# Patient Record
Sex: Female | Born: 1935
Health system: Southern US, Community
[De-identification: ages and names within clinical notes are randomized; demographics above are authoritative.]

## PROBLEM LIST (undated history)

## (undated) ENCOUNTER — Ambulatory Visit: Payer: Medicare Other

## (undated) DIAGNOSIS — I779 Disorder of arteries and arterioles, unspecified: Secondary | ICD-10-CM

## (undated) DIAGNOSIS — E785 Hyperlipidemia, unspecified: Secondary | ICD-10-CM

## (undated) DIAGNOSIS — K8050 Calculus of bile duct without cholangitis or cholecystitis without obstruction: Secondary | ICD-10-CM

## (undated) DIAGNOSIS — R112 Nausea with vomiting, unspecified: Secondary | ICD-10-CM

## (undated) DIAGNOSIS — I739 Peripheral vascular disease, unspecified: Secondary | ICD-10-CM

## (undated) DIAGNOSIS — L910 Hypertrophic scar: Secondary | ICD-10-CM

## (undated) DIAGNOSIS — F419 Anxiety disorder, unspecified: Secondary | ICD-10-CM

## (undated) DIAGNOSIS — R7301 Impaired fasting glucose: Secondary | ICD-10-CM

## (undated) DIAGNOSIS — K8309 Other cholangitis: Secondary | ICD-10-CM

## (undated) DIAGNOSIS — I9789 Other postprocedural complications and disorders of the circulatory system, not elsewhere classified: Secondary | ICD-10-CM

## (undated) DIAGNOSIS — Z973 Presence of spectacles and contact lenses: Secondary | ICD-10-CM

## (undated) DIAGNOSIS — K8020 Calculus of gallbladder without cholecystitis without obstruction: Secondary | ICD-10-CM

## (undated) DIAGNOSIS — M1990 Unspecified osteoarthritis, unspecified site: Secondary | ICD-10-CM

## (undated) DIAGNOSIS — R519 Headache, unspecified: Secondary | ICD-10-CM

## (undated) DIAGNOSIS — F329 Major depressive disorder, single episode, unspecified: Secondary | ICD-10-CM

## (undated) DIAGNOSIS — F32A Depression, unspecified: Secondary | ICD-10-CM

## (undated) DIAGNOSIS — Z9289 Personal history of other medical treatment: Secondary | ICD-10-CM

## (undated) DIAGNOSIS — Z951 Presence of aortocoronary bypass graft: Secondary | ICD-10-CM

## (undated) DIAGNOSIS — G459 Transient cerebral ischemic attack, unspecified: Secondary | ICD-10-CM

## (undated) DIAGNOSIS — C7A090 Malignant carcinoid tumor of the bronchus and lung: Secondary | ICD-10-CM

## (undated) DIAGNOSIS — H269 Unspecified cataract: Secondary | ICD-10-CM

## (undated) DIAGNOSIS — J189 Pneumonia, unspecified organism: Secondary | ICD-10-CM

## (undated) DIAGNOSIS — I2510 Atherosclerotic heart disease of native coronary artery without angina pectoris: Secondary | ICD-10-CM

## (undated) DIAGNOSIS — I493 Ventricular premature depolarization: Secondary | ICD-10-CM

## (undated) DIAGNOSIS — K529 Noninfective gastroenteritis and colitis, unspecified: Secondary | ICD-10-CM

## (undated) DIAGNOSIS — IMO0002 Reserved for concepts with insufficient information to code with codable children: Secondary | ICD-10-CM

## (undated) DIAGNOSIS — I4891 Unspecified atrial fibrillation: Secondary | ICD-10-CM

## (undated) DIAGNOSIS — C801 Malignant (primary) neoplasm, unspecified: Secondary | ICD-10-CM

## (undated) DIAGNOSIS — Z9889 Other specified postprocedural states: Secondary | ICD-10-CM

## (undated) DIAGNOSIS — R51 Headache: Secondary | ICD-10-CM

## (undated) DIAGNOSIS — I10 Essential (primary) hypertension: Secondary | ICD-10-CM

## (undated) DIAGNOSIS — Z974 Presence of external hearing-aid: Secondary | ICD-10-CM

## (undated) DIAGNOSIS — H3530 Unspecified macular degeneration: Secondary | ICD-10-CM

## (undated) HISTORY — DX: Unspecified macular degeneration: H35.30

## (undated) HISTORY — PX: LOBECTOMY: SHX5089

## (undated) HISTORY — DX: Atherosclerotic heart disease of native coronary artery without angina pectoris: I25.10

## (undated) HISTORY — DX: Essential (primary) hypertension: I10

## (undated) HISTORY — DX: Anxiety disorder, unspecified: F41.9

## (undated) HISTORY — DX: Calculus of gallbladder without cholecystitis without obstruction: K80.20

## (undated) HISTORY — DX: Malignant carcinoid tumor of the bronchus and lung: C7A.090

## (undated) HISTORY — DX: Major depressive disorder, single episode, unspecified: F32.9

## (undated) HISTORY — PX: CHOLECYSTECTOMY: SHX55

## (undated) HISTORY — DX: Unspecified osteoarthritis, unspecified site: M19.90

## (undated) HISTORY — PX: OTHER SURGICAL HISTORY: SHX169

## (undated) HISTORY — DX: Transient cerebral ischemic attack, unspecified: G45.9

## (undated) HISTORY — DX: Unspecified cataract: H26.9

## (undated) HISTORY — PX: ABDOMINAL HYSTERECTOMY: SHX81

## (undated) HISTORY — DX: Impaired fasting glucose: R73.01

## (undated) HISTORY — DX: Depression, unspecified: F32.A

## (undated) HISTORY — DX: Reserved for concepts with insufficient information to code with codable children: IMO0002

## (undated) HISTORY — DX: Gilbert syndrome: E80.4

## (undated) HISTORY — DX: Hypertrophic scar: L91.0

---

## 1984-01-13 HISTORY — PX: OTHER SURGICAL HISTORY: SHX169

## 1998-02-04 ENCOUNTER — Inpatient Hospital Stay (HOSPITAL_COMMUNITY): Admission: AD | Admit: 1998-02-04 | Discharge: 1998-02-08 | Payer: Self-pay | Admitting: Rheumatology

## 1998-02-06 ENCOUNTER — Encounter: Payer: Self-pay | Admitting: Rheumatology

## 1998-12-27 ENCOUNTER — Encounter: Admission: RE | Admit: 1998-12-27 | Discharge: 1998-12-27 | Payer: Self-pay | Admitting: Rheumatology

## 1998-12-27 ENCOUNTER — Encounter: Payer: Self-pay | Admitting: Rheumatology

## 1999-01-13 HISTORY — PX: OTHER SURGICAL HISTORY: SHX169

## 1999-03-05 ENCOUNTER — Encounter: Payer: Self-pay | Admitting: Rheumatology

## 1999-03-05 ENCOUNTER — Encounter: Admission: RE | Admit: 1999-03-05 | Discharge: 1999-03-05 | Payer: Self-pay | Admitting: Rheumatology

## 1999-05-12 ENCOUNTER — Encounter: Payer: Self-pay | Admitting: Rheumatology

## 1999-05-12 ENCOUNTER — Encounter: Admission: RE | Admit: 1999-05-12 | Discharge: 1999-05-12 | Payer: Self-pay | Admitting: Rheumatology

## 1999-09-01 ENCOUNTER — Encounter: Admission: RE | Admit: 1999-09-01 | Discharge: 1999-09-01 | Payer: Self-pay | Admitting: Rheumatology

## 1999-09-01 ENCOUNTER — Encounter: Payer: Self-pay | Admitting: Rheumatology

## 1999-12-05 ENCOUNTER — Inpatient Hospital Stay (HOSPITAL_COMMUNITY): Admission: AD | Admit: 1999-12-05 | Discharge: 1999-12-09 | Payer: Self-pay | Admitting: Cardiology

## 2000-03-04 ENCOUNTER — Encounter: Payer: Self-pay | Admitting: Rheumatology

## 2000-03-04 ENCOUNTER — Encounter: Admission: RE | Admit: 2000-03-04 | Discharge: 2000-03-04 | Payer: Self-pay | Admitting: Rheumatology

## 2000-06-29 ENCOUNTER — Encounter: Payer: Self-pay | Admitting: Rheumatology

## 2000-06-29 ENCOUNTER — Encounter: Admission: RE | Admit: 2000-06-29 | Discharge: 2000-06-29 | Payer: Self-pay | Admitting: Rheumatology

## 2000-07-06 ENCOUNTER — Encounter: Admission: RE | Admit: 2000-07-06 | Discharge: 2000-07-06 | Payer: Self-pay | Admitting: Rheumatology

## 2000-07-06 ENCOUNTER — Encounter: Payer: Self-pay | Admitting: Rheumatology

## 2000-10-29 ENCOUNTER — Ambulatory Visit (HOSPITAL_COMMUNITY): Admission: RE | Admit: 2000-10-29 | Discharge: 2000-10-29 | Payer: Self-pay | Admitting: Gastroenterology

## 2001-02-22 ENCOUNTER — Encounter: Payer: Self-pay | Admitting: Rheumatology

## 2001-02-22 ENCOUNTER — Encounter: Admission: RE | Admit: 2001-02-22 | Discharge: 2001-02-22 | Payer: Self-pay | Admitting: Rheumatology

## 2001-06-20 ENCOUNTER — Encounter: Admission: RE | Admit: 2001-06-20 | Discharge: 2001-06-20 | Payer: Self-pay | Admitting: Rheumatology

## 2001-06-20 ENCOUNTER — Encounter: Payer: Self-pay | Admitting: Rheumatology

## 2001-07-11 ENCOUNTER — Encounter: Payer: Self-pay | Admitting: Rheumatology

## 2001-07-11 ENCOUNTER — Encounter: Admission: RE | Admit: 2001-07-11 | Discharge: 2001-07-11 | Payer: Self-pay | Admitting: Rheumatology

## 2001-11-11 ENCOUNTER — Emergency Department (HOSPITAL_COMMUNITY): Admission: EM | Admit: 2001-11-11 | Discharge: 2001-11-11 | Payer: Self-pay | Admitting: Emergency Medicine

## 2001-11-15 ENCOUNTER — Ambulatory Visit (HOSPITAL_COMMUNITY): Admission: RE | Admit: 2001-11-15 | Discharge: 2001-11-15 | Payer: Self-pay | Admitting: Rheumatology

## 2001-11-15 ENCOUNTER — Encounter: Payer: Self-pay | Admitting: Rheumatology

## 2001-11-16 ENCOUNTER — Ambulatory Visit (HOSPITAL_COMMUNITY): Admission: RE | Admit: 2001-11-16 | Discharge: 2001-11-16 | Payer: Self-pay | Admitting: Rheumatology

## 2001-12-16 ENCOUNTER — Encounter: Admission: RE | Admit: 2001-12-16 | Discharge: 2001-12-16 | Payer: Self-pay | Admitting: Rheumatology

## 2001-12-16 ENCOUNTER — Encounter: Payer: Self-pay | Admitting: Rheumatology

## 2002-09-19 ENCOUNTER — Encounter: Payer: Self-pay | Admitting: Rheumatology

## 2002-09-19 ENCOUNTER — Encounter: Admission: RE | Admit: 2002-09-19 | Discharge: 2002-09-19 | Payer: Self-pay | Admitting: Rheumatology

## 2002-09-22 ENCOUNTER — Encounter: Admission: RE | Admit: 2002-09-22 | Discharge: 2002-09-22 | Payer: Self-pay | Admitting: Rheumatology

## 2002-09-22 ENCOUNTER — Encounter: Payer: Self-pay | Admitting: Rheumatology

## 2002-11-08 ENCOUNTER — Encounter: Admission: RE | Admit: 2002-11-08 | Discharge: 2002-11-08 | Payer: Self-pay | Admitting: Thoracic Surgery

## 2002-11-15 ENCOUNTER — Inpatient Hospital Stay (HOSPITAL_COMMUNITY): Admission: RE | Admit: 2002-11-15 | Discharge: 2002-11-22 | Payer: Self-pay | Admitting: Thoracic Surgery

## 2002-11-15 ENCOUNTER — Encounter (INDEPENDENT_AMBULATORY_CARE_PROVIDER_SITE_OTHER): Payer: Self-pay | Admitting: *Deleted

## 2002-12-05 ENCOUNTER — Encounter: Admission: RE | Admit: 2002-12-05 | Discharge: 2002-12-05 | Payer: Self-pay | Admitting: Thoracic Surgery

## 2002-12-08 ENCOUNTER — Emergency Department (HOSPITAL_COMMUNITY): Admission: EM | Admit: 2002-12-08 | Discharge: 2002-12-08 | Payer: Self-pay | Admitting: Emergency Medicine

## 2002-12-27 ENCOUNTER — Encounter: Admission: RE | Admit: 2002-12-27 | Discharge: 2002-12-27 | Payer: Self-pay | Admitting: Thoracic Surgery

## 2003-02-14 ENCOUNTER — Ambulatory Visit (HOSPITAL_COMMUNITY): Admission: RE | Admit: 2003-02-14 | Discharge: 2003-02-14 | Payer: Self-pay | Admitting: Neurology

## 2003-03-28 ENCOUNTER — Encounter: Admission: RE | Admit: 2003-03-28 | Discharge: 2003-03-28 | Payer: Self-pay | Admitting: Thoracic Surgery

## 2003-07-03 ENCOUNTER — Encounter: Admission: RE | Admit: 2003-07-03 | Discharge: 2003-07-03 | Payer: Self-pay | Admitting: Thoracic Surgery

## 2003-10-10 ENCOUNTER — Encounter: Admission: RE | Admit: 2003-10-10 | Discharge: 2003-10-10 | Payer: Self-pay | Admitting: Rheumatology

## 2003-11-22 ENCOUNTER — Encounter: Admission: RE | Admit: 2003-11-22 | Discharge: 2003-11-22 | Payer: Self-pay | Admitting: Thoracic Surgery

## 2003-12-14 ENCOUNTER — Ambulatory Visit: Payer: Self-pay | Admitting: Cardiology

## 2004-03-24 ENCOUNTER — Ambulatory Visit: Payer: Self-pay | Admitting: Thoracic Surgery

## 2004-03-26 ENCOUNTER — Encounter: Admission: RE | Admit: 2004-03-26 | Discharge: 2004-03-26 | Payer: Self-pay | Admitting: Thoracic Surgery

## 2004-04-16 ENCOUNTER — Ambulatory Visit: Payer: Self-pay | Admitting: Cardiology

## 2004-05-02 ENCOUNTER — Ambulatory Visit: Payer: Self-pay

## 2004-09-24 ENCOUNTER — Encounter: Admission: RE | Admit: 2004-09-24 | Discharge: 2004-09-24 | Payer: Self-pay | Admitting: Thoracic Surgery

## 2004-11-24 ENCOUNTER — Encounter: Admission: RE | Admit: 2004-11-24 | Discharge: 2004-11-24 | Payer: Self-pay | Admitting: Rheumatology

## 2005-03-24 ENCOUNTER — Encounter: Admission: RE | Admit: 2005-03-24 | Discharge: 2005-03-24 | Payer: Self-pay | Admitting: Thoracic Surgery

## 2005-04-20 ENCOUNTER — Ambulatory Visit: Payer: Self-pay | Admitting: Cardiology

## 2005-05-19 ENCOUNTER — Ambulatory Visit: Payer: Self-pay

## 2005-07-04 ENCOUNTER — Emergency Department (HOSPITAL_COMMUNITY): Admission: EM | Admit: 2005-07-04 | Discharge: 2005-07-04 | Payer: Self-pay | Admitting: Emergency Medicine

## 2005-08-27 ENCOUNTER — Encounter: Admission: RE | Admit: 2005-08-27 | Discharge: 2005-08-27 | Payer: Self-pay | Admitting: Rheumatology

## 2005-09-01 ENCOUNTER — Ambulatory Visit: Payer: Self-pay | Admitting: Cardiology

## 2005-11-25 ENCOUNTER — Encounter: Admission: RE | Admit: 2005-11-25 | Discharge: 2005-11-25 | Payer: Self-pay | Admitting: Rheumatology

## 2006-03-16 ENCOUNTER — Ambulatory Visit: Payer: Self-pay | Admitting: Thoracic Surgery

## 2006-03-16 ENCOUNTER — Encounter: Admission: RE | Admit: 2006-03-16 | Discharge: 2006-03-16 | Payer: Self-pay | Admitting: Thoracic Surgery

## 2006-04-28 ENCOUNTER — Encounter: Admission: RE | Admit: 2006-04-28 | Discharge: 2006-04-28 | Payer: Self-pay | Admitting: Rheumatology

## 2006-05-03 ENCOUNTER — Ambulatory Visit: Payer: Self-pay

## 2006-05-05 ENCOUNTER — Ambulatory Visit: Payer: Self-pay | Admitting: Cardiology

## 2006-05-06 ENCOUNTER — Ambulatory Visit: Payer: Self-pay

## 2006-05-11 ENCOUNTER — Ambulatory Visit: Payer: Self-pay | Admitting: Cardiology

## 2006-05-11 LAB — CONVERTED CEMR LAB
BUN: 14 mg/dL (ref 6–23)
Basophils Relative: 0.3 % (ref 0.0–1.0)
CO2: 31 meq/L (ref 19–32)
Creatinine, Ser: 0.9 mg/dL (ref 0.4–1.2)
Eosinophils Relative: 1.8 % (ref 0.0–5.0)
Glucose, Bld: 110 mg/dL — ABNORMAL HIGH (ref 70–99)
HCT: 42.4 % (ref 36.0–46.0)
Hemoglobin: 14.6 g/dL (ref 12.0–15.0)
INR: 0.9 (ref 0.9–2.0)
Lymphocytes Relative: 27.1 % (ref 12.0–46.0)
Monocytes Absolute: 0.6 10*3/uL (ref 0.2–0.7)
Monocytes Relative: 8.6 % (ref 3.0–11.0)
Neutrophils Relative %: 62.2 % (ref 43.0–77.0)
Potassium: 3.1 meq/L — ABNORMAL LOW (ref 3.5–5.1)
Prothrombin Time: 11.4 s (ref 10.0–14.0)
RDW: 12.6 % (ref 11.5–14.6)
WBC: 6.8 10*3/uL (ref 4.5–10.5)

## 2006-05-12 ENCOUNTER — Ambulatory Visit: Payer: Self-pay | Admitting: Cardiology

## 2006-05-12 ENCOUNTER — Ambulatory Visit (HOSPITAL_COMMUNITY): Admission: RE | Admit: 2006-05-12 | Discharge: 2006-05-12 | Payer: Self-pay | Admitting: Cardiology

## 2006-05-18 ENCOUNTER — Ambulatory Visit: Payer: Self-pay | Admitting: Cardiology

## 2006-05-18 LAB — CONVERTED CEMR LAB
BUN: 14 mg/dL (ref 6–23)
CO2: 30 meq/L (ref 19–32)
Creatinine, Ser: 0.8 mg/dL (ref 0.4–1.2)
Glucose, Bld: 108 mg/dL — ABNORMAL HIGH (ref 70–99)
Potassium: 3.6 meq/L (ref 3.5–5.1)

## 2006-06-03 ENCOUNTER — Ambulatory Visit: Payer: Self-pay | Admitting: Cardiology

## 2006-06-03 LAB — CONVERTED CEMR LAB
BUN: 10 mg/dL (ref 6–23)
CO2: 33 meq/L — ABNORMAL HIGH (ref 19–32)
Calcium: 9.6 mg/dL (ref 8.4–10.5)
Creatinine, Ser: 0.8 mg/dL (ref 0.4–1.2)

## 2006-06-09 ENCOUNTER — Ambulatory Visit: Payer: Self-pay | Admitting: Cardiology

## 2006-06-09 LAB — CONVERTED CEMR LAB
CO2: 31 meq/L (ref 19–32)
Calcium: 9.6 mg/dL (ref 8.4–10.5)
Chloride: 102 meq/L (ref 96–112)
Glucose, Bld: 128 mg/dL — ABNORMAL HIGH (ref 70–99)

## 2006-06-17 ENCOUNTER — Ambulatory Visit: Payer: Self-pay | Admitting: Cardiology

## 2006-06-17 LAB — CONVERTED CEMR LAB
CO2: 30 meq/L (ref 19–32)
Chloride: 104 meq/L (ref 96–112)
Creatinine, Ser: 0.8 mg/dL (ref 0.4–1.2)
Glucose, Bld: 116 mg/dL — ABNORMAL HIGH (ref 70–99)
Sodium: 140 meq/L (ref 135–145)

## 2006-07-01 ENCOUNTER — Ambulatory Visit: Payer: Self-pay | Admitting: Cardiology

## 2006-07-01 LAB — CONVERTED CEMR LAB
CO2: 33 meq/L — ABNORMAL HIGH (ref 19–32)
Creatinine, Ser: 0.7 mg/dL (ref 0.4–1.2)
Potassium: 3.6 meq/L (ref 3.5–5.1)
Sodium: 142 meq/L (ref 135–145)

## 2006-08-02 ENCOUNTER — Ambulatory Visit: Payer: Self-pay | Admitting: Cardiology

## 2006-08-02 LAB — CONVERTED CEMR LAB
BUN: 13 mg/dL (ref 6–23)
Chloride: 101 meq/L (ref 96–112)
GFR calc Af Amer: 79 mL/min
GFR calc non Af Amer: 65 mL/min
Glucose, Bld: 110 mg/dL — ABNORMAL HIGH (ref 70–99)
Magnesium: 1.7 mg/dL (ref 1.5–2.5)

## 2006-09-30 ENCOUNTER — Encounter: Admission: RE | Admit: 2006-09-30 | Discharge: 2006-09-30 | Payer: Self-pay | Admitting: Thoracic Surgery

## 2006-09-30 ENCOUNTER — Ambulatory Visit: Payer: Self-pay | Admitting: Thoracic Surgery

## 2006-10-06 ENCOUNTER — Ambulatory Visit: Payer: Self-pay | Admitting: Cardiology

## 2006-10-06 LAB — CONVERTED CEMR LAB
Calcium: 9.2 mg/dL (ref 8.4–10.5)
Chloride: 102 meq/L (ref 96–112)
GFR calc Af Amer: 90 mL/min
GFR calc non Af Amer: 75 mL/min
Glucose, Bld: 138 mg/dL — ABNORMAL HIGH (ref 70–99)
Magnesium: 1.9 mg/dL (ref 1.5–2.5)
Sodium: 144 meq/L (ref 135–145)

## 2006-11-08 ENCOUNTER — Ambulatory Visit: Payer: Self-pay | Admitting: Cardiology

## 2006-12-20 ENCOUNTER — Encounter: Admission: RE | Admit: 2006-12-20 | Discharge: 2006-12-20 | Payer: Self-pay | Admitting: Rheumatology

## 2007-04-13 ENCOUNTER — Ambulatory Visit: Payer: Self-pay | Admitting: Thoracic Surgery

## 2007-04-13 ENCOUNTER — Encounter: Admission: RE | Admit: 2007-04-13 | Discharge: 2007-04-13 | Payer: Self-pay | Admitting: Thoracic Surgery

## 2007-07-06 ENCOUNTER — Ambulatory Visit: Payer: Self-pay

## 2007-07-06 ENCOUNTER — Ambulatory Visit: Payer: Self-pay | Admitting: Cardiology

## 2007-07-06 LAB — CONVERTED CEMR LAB
BUN: 15 mg/dL (ref 6–23)
Creatinine, Ser: 0.8 mg/dL (ref 0.4–1.2)
GFR calc Af Amer: 90 mL/min
GFR calc non Af Amer: 74 mL/min
Potassium: 3.8 meq/L (ref 3.5–5.1)

## 2007-10-19 ENCOUNTER — Encounter: Admission: RE | Admit: 2007-10-19 | Discharge: 2007-10-19 | Payer: Self-pay | Admitting: Thoracic Surgery

## 2007-10-19 ENCOUNTER — Ambulatory Visit: Payer: Self-pay | Admitting: Thoracic Surgery

## 2007-12-21 ENCOUNTER — Encounter: Admission: RE | Admit: 2007-12-21 | Discharge: 2007-12-21 | Payer: Self-pay | Admitting: Rheumatology

## 2007-12-26 ENCOUNTER — Ambulatory Visit: Payer: Self-pay | Admitting: Cardiology

## 2008-05-03 ENCOUNTER — Encounter: Payer: Self-pay | Admitting: Cardiology

## 2008-05-11 DIAGNOSIS — I10 Essential (primary) hypertension: Secondary | ICD-10-CM

## 2008-05-11 DIAGNOSIS — I2510 Atherosclerotic heart disease of native coronary artery without angina pectoris: Secondary | ICD-10-CM

## 2008-05-11 DIAGNOSIS — I208 Other forms of angina pectoris: Secondary | ICD-10-CM

## 2008-05-28 ENCOUNTER — Ambulatory Visit: Payer: Self-pay | Admitting: Cardiology

## 2008-05-28 DIAGNOSIS — R0989 Other specified symptoms and signs involving the circulatory and respiratory systems: Secondary | ICD-10-CM | POA: Insufficient documentation

## 2008-08-06 ENCOUNTER — Ambulatory Visit: Payer: Self-pay | Admitting: Cardiology

## 2008-08-06 DIAGNOSIS — E7800 Pure hypercholesterolemia, unspecified: Secondary | ICD-10-CM | POA: Insufficient documentation

## 2008-08-24 ENCOUNTER — Encounter: Payer: Self-pay | Admitting: Cardiology

## 2008-08-29 ENCOUNTER — Telehealth (INDEPENDENT_AMBULATORY_CARE_PROVIDER_SITE_OTHER): Payer: Self-pay | Admitting: *Deleted

## 2008-09-05 ENCOUNTER — Inpatient Hospital Stay (HOSPITAL_COMMUNITY): Admission: RE | Admit: 2008-09-05 | Discharge: 2008-09-08 | Payer: Self-pay | Admitting: Surgery

## 2008-09-05 ENCOUNTER — Encounter (INDEPENDENT_AMBULATORY_CARE_PROVIDER_SITE_OTHER): Payer: Self-pay | Admitting: Surgery

## 2008-09-13 ENCOUNTER — Emergency Department: Payer: Medicare Other | Admitting: Emergency Medicine

## 2008-09-14 ENCOUNTER — Inpatient Hospital Stay (HOSPITAL_COMMUNITY): Admission: EM | Admit: 2008-09-14 | Discharge: 2008-09-19 | Payer: Self-pay | Admitting: Emergency Medicine

## 2008-10-08 ENCOUNTER — Encounter: Payer: Self-pay | Admitting: Cardiology

## 2009-02-21 ENCOUNTER — Ambulatory Visit: Payer: Self-pay | Admitting: Cardiology

## 2009-03-06 ENCOUNTER — Ambulatory Visit: Payer: Self-pay

## 2009-03-06 ENCOUNTER — Encounter: Admission: RE | Admit: 2009-03-06 | Discharge: 2009-03-06 | Payer: Self-pay | Admitting: Geriatric Medicine

## 2009-03-06 ENCOUNTER — Encounter: Payer: Self-pay | Admitting: Cardiology

## 2010-02-01 ENCOUNTER — Encounter: Payer: Self-pay | Admitting: Thoracic Surgery

## 2010-02-02 ENCOUNTER — Encounter: Payer: Self-pay | Admitting: Thoracic Surgery

## 2010-02-11 NOTE — Letter (Signed)
Summary: [facility] Office Visit Note   [facility] Surgery Office Visit Note   Imported By: Roderic Ovens 07/30/2009 11:38:42  _____________________________________________________________________  External Attachment:    Type:   Image     Comment:   External Document

## 2010-02-11 NOTE — Assessment & Plan Note (Signed)
Summary: [name]   Visit Type:  6 months follow up Primary Provider:  Merlene Laughter  CC:  chest pain.  History of Present Illness: Patient had gallbladder surgery, and had what sounds like an ERCP, then developed a problem leading to a weeklong admission.  She tolerated all of this from a heart standpoint.    Current Medications (verified): 1)  Klor-Con M20 20 Meq Cr-Tabs (Potassium Chloride Crys Cr) .... Take 1 By Mouth Two Times A Day 2)  Toprol Xl 25 Mg Xr24h-Tab (Metoprolol Succinate) .... Take 1 By Mouth Once Daily 3)  Premarin 0.3 Mg Tabs (Estrogens Conjugated) .... Take 1 By Mouth Once Daily 4)  Norvasc 10 Mg Tabs (Amlodipine Besylate) .... Take 1 By Mouth Once Daily 5)  Hydrochlorothiazide 25 Mg Tabs (Hydrochlorothiazide) .... Take One Tablet By Mouth Daily. 6)  Multivitamins  Tabs (Multiple Vitamin) .... Take 1 By Mouth Once Daily 7)  Calcium 600/vitamin D 600-400 Mg-Unit Chew (Calcium Carbonate-Vitamin D) .... Take 2 By Mouth Once Daily 8)  Lorazepam 0.5 Mg Tabs (Lorazepam) .... Can Take 3 Daily If Needed 9)  Lipitor 20 Mg Tabs (Atorvastatin Calcium) .... Take One Tablet By Mouth Daily. 10)  Plavix 75 Mg Tabs (Clopidogrel Bisulfate) .... Take One Tablet By Mouth Daily 11)  Nitroglycerin 0.4 Mg Subl (Nitroglycerin) .... One Tablet Under Tongue Every 5 Minutes As Needed For Chest Pain---May Repeat Times Three 12)  Aspirin 81 Mg Tbec (Aspirin) .... Take One Tablet By Mouth Daily 13)  Acetaminophen 500 Mg Tabs (Acetaminophen) .... As Needed 14)  Lisinopril 5 Mg Tabs (Lisinopril) .... Take One Tablet By Mouth Daily  Allergies: 1)  ! Penicillin 2)  ! Codeine 3)  ! * Oxycontin 4)  ! Celebrex 5)  ! Zoloft 6)  ! Armandina Stammer Gal-S  Past History:  Past Medical History: Last updated: 05/11/2008 HYPERTENSION, UNSPECIFIED (ICD-401.9) CHEST PAIN-UNSPECIFIED (ICD-786.50) CAD, UNSPECIFIED SITE (ICD-414.00)    Past Surgical History: PCI 1986 Carcinoid from lung removed  [place] PCI of  LAD with non DES LAD 2001 PCI of mid CFX with non DES, and cutting balloon PCI of av circumflex.  Vital Signs:  Patient profile:   77 year old female Height:      66 inches Weight:      185.50 pounds BMI:     30.05 Pulse rate:   71 / minute Pulse rhythm:   regular Resp:     18 per minute BP sitting:   148 / 73  (left arm) Cuff size:   large  Vitals Entered By: Vikki Ports (February 21, 2009 4:23 PM)  Physical Exam  General:  Well developed, well nourished, in no acute distress. Head:  normocephalic and atraumatic Eyes:  PERRLA/EOM intact; conjunctiva and lids normal. Neck:  L carotid bruit.  None on R. Lungs:  Clear bilaterally to auscultation and percussion. Heart:  Non-displaced PMI, chest non-tender; regular rate and rhythm, S1, S2 without murmurs, rubs or gallops. Carotid upstroke normal, no bruit. Normal abdominal aortic size, no bruits. Femorals normal pulses, no bruits. Pedals normal pulses. No edema, no varicosities. Abdomen:  Bowel sounds positive; abdomen soft and non-tender without masses, organomegaly, or hernias noted. No hepatosplenomegaly. Msk:  Back normal, normal gait. Muscle strength and tone normal. Extremities:  No clubbing or cyanosis.  No edema Neurologic:  Alert and oriented x 3.   Carotid Doppler  Procedure date:  07/06/2007  Findings:      0-39% bilaterally  EKG  Procedure date:  02/21/2009  Findings:  NSR.  Nonspecific ST abnormality  Impression & Recommendations:  Problem # 1:  CAD, UNSPECIFIED SITE (ICD-414.00)  stable at present.  No Symptoms.  Chart updated requiring 30 minutes. Her updated medication list for this problem includes:    Toprol Xl 25 Mg Xr24h-tab (Metoprolol succinate) .Marland Kitchen... Take 1 by mouth once daily    Norvasc 10 Mg Tabs (Amlodipine besylate) .Marland Kitchen... Take 1 by mouth once daily    Plavix 75 Mg Tabs (Clopidogrel bisulfate) .Marland Kitchen... Take one tablet by mouth daily    Nitroglycerin 0.4 Mg Subl (Nitroglycerin) ..... One  tablet under tongue every 5 minutes as needed for chest pain---may repeat times three    Aspirin 81 Mg Tbec (Aspirin) .Marland Kitchen... Take one tablet by mouth daily    Lisinopril 5 Mg Tabs (Lisinopril) .Marland Kitchen... Take one tablet by mouth daily  Orders: EKG w/ Interpretation (93000) Carotid Duplex (Carotid Duplex)  Problem # 2:  HYPERCHOLESTEROLEMIA  IIA (ICD-272.0)  Stable and followed by Dr. Pete Glatter. Her updated medication list for this problem includes:    Lipitor 20 Mg Tabs (Atorvastatin calcium) .Marland Kitchen... Take one tablet by mouth daily.  Orders: EKG w/ Interpretation (93000) Carotid Duplex (Carotid Duplex)  Problem # 3:  BRUIT (ICD-785.9)  See dopplers. Left bruit again noted.  No high grade lesion  followup at two years.  Orders: EKG w/ Interpretation (93000) Carotid Duplex (Carotid Duplex)  Problem # 4:  HYPERTENSION, UNSPECIFIED (ICD-401.9)  Her updated medication list for this problem includes:    Toprol Xl 25 Mg Xr24h-tab (Metoprolol succinate) .Marland Kitchen... Take 1 by mouth once daily    Norvasc 10 Mg Tabs (Amlodipine besylate) .Marland Kitchen... Take 1 by mouth once daily    Hydrochlorothiazide 25 Mg Tabs (Hydrochlorothiazide) .Marland Kitchen... Take one tablet by mouth daily.    Aspirin 81 Mg Tbec (Aspirin) .Marland Kitchen... Take one tablet by mouth daily    Lisinopril 5 Mg Tabs (Lisinopril) .Marland Kitchen... Take one tablet by mouth daily  Orders: EKG w/ Interpretation (93000) Carotid Duplex (Carotid Duplex)  Patient Instructions: 1)  Your physician recommends that you continue on your current medications as directed. Please refer to the Current Medication list given to you today. 2)  Your physician wants you to follow-up in:   1 YEAR. You will receive a reminder letter in the mail two months in advance. If you don't receive a letter, please call our office to schedule the follow-up appointment. 3)  Your physician has requested that you have a carotid duplex. This test is an ultrasound of the carotid arteries in your neck. It looks at  blood flow through these arteries that supply the brain with blood. Allow one hour for this exam. There are no restrictions or special instructions.

## 2010-02-11 NOTE — Consult Note (Signed)
Summary: [facility] Surgery   Imported By: Marylou Mccoy 06/07/2008 13:41:00  _____________________________________________________________________  External Attachment:    Type:   Image     Comment:   External Document

## 2010-03-07 ENCOUNTER — Encounter: Payer: Self-pay | Admitting: Cardiology

## 2010-03-11 ENCOUNTER — Encounter: Payer: Self-pay | Admitting: Cardiology

## 2010-03-11 ENCOUNTER — Ambulatory Visit (INDEPENDENT_AMBULATORY_CARE_PROVIDER_SITE_OTHER): Payer: Medicare Other | Admitting: Cardiology

## 2010-03-11 ENCOUNTER — Encounter (INDEPENDENT_AMBULATORY_CARE_PROVIDER_SITE_OTHER): Payer: Medicare Other

## 2010-03-11 DIAGNOSIS — R0989 Other specified symptoms and signs involving the circulatory and respiratory systems: Secondary | ICD-10-CM

## 2010-03-11 DIAGNOSIS — I2510 Atherosclerotic heart disease of native coronary artery without angina pectoris: Secondary | ICD-10-CM

## 2010-03-11 DIAGNOSIS — I6529 Occlusion and stenosis of unspecified carotid artery: Secondary | ICD-10-CM

## 2010-03-11 DIAGNOSIS — E7800 Pure hypercholesterolemia, unspecified: Secondary | ICD-10-CM

## 2010-03-11 DIAGNOSIS — I10 Essential (primary) hypertension: Secondary | ICD-10-CM

## 2010-03-11 NOTE — Miscellaneous (Signed)
Summary: Orders Update  Clinical Lists Changes  Orders: Added new Test order of Carotid Duplex (Carotid Duplex) - Signed 

## 2010-03-20 NOTE — Assessment & Plan Note (Signed)
Summary: 42yr follow up   Visit Type:  1 year follow up Primary Provider:  Merlene Laughter  CC:  Some chest pains.  History of Present Illness: Overall doing well.  No major complaints.  Has occasional to rare chest discomfort, but nothing like in the past when she had her cardiac events.  Feels good.  Carotids reviewed in detail with her today.  Denies any progressive symptoms.    Problems Prior to Update: 1)  Hypercholesterolemia Iia  (ICD-272.0) 2)  Bruit  (ICD-785.9) 3)  Hypertension, Unspecified  (ICD-401.9) 4)  Chest Pain-unspecified  (ICD-786.50) 5)  Cad, Unspecified Site  (ICD-414.00)  Current Medications (verified): 1)  Klor-Con M20 20 Meq Cr-Tabs (Potassium Chloride Crys Cr) .... Take 1 By Mouth Two Times A Day 2)  Toprol Xl 25 Mg Xr24h-Tab (Metoprolol Succinate) .... Take 1 By Mouth Once Daily 3)  Premarin 0.3 Mg Tabs (Estrogens Conjugated) .... Take 1 By Mouth Once Daily 4)  Norvasc 10 Mg Tabs (Amlodipine Besylate) .... Take 1 By Mouth Once Daily 5)  Hydrochlorothiazide 25 Mg Tabs (Hydrochlorothiazide) .... Take One Tablet By Mouth Daily. 6)  Multivitamins  Tabs (Multiple Vitamin) .... Take 1 By Mouth Once Daily 7)  Calcium 600/vitamin D 600-400 Mg-Unit Chew (Calcium Carbonate-Vitamin D) .... Take 2 By Mouth Once Daily 8)  Lorazepam 0.5 Mg Tabs (Lorazepam) .... Can Take 3 Daily If Needed 9)  Lipitor 20 Mg Tabs (Atorvastatin Calcium) .... Take One Tablet By Mouth Daily. 10)  Plavix 75 Mg Tabs (Clopidogrel Bisulfate) .... Take One Tablet By Mouth Daily 11)  Nitroglycerin 0.4 Mg Subl (Nitroglycerin) .... One Tablet Under Tongue Every 5 Minutes As Needed For Chest Pain---May Repeat Times Three 12)  Aspirin 81 Mg Tbec (Aspirin) .... Take One Tablet By Mouth Daily 13)  Acetaminophen 500 Mg Tabs (Acetaminophen) .... As Needed 14)  Losartan Potassium 25 Mg Tabs (Losartan Potassium) .... Take 1 Tablet By Mouth Once A Day  Allergies: 1)  ! Penicillin 2)  ! Codeine 3)  ! *  Oxycontin 4)  ! Celebrex 5)  ! Zoloft 6)  ! Armandina Stammer Gal-S  Vital Signs:  Patient profile:   77 year old female Height:      66 inches Weight:      186.50 pounds BMI:     30.21 Pulse rate:   68 / minute Pulse rhythm:   regular Resp:     18 per minute BP sitting:   142 / 70  (left arm) Cuff size:   large  Vitals Entered By: Vikki Ports (March 11, 2010 2:55 PM)  Physical Exam  General:  Well developed, well nourished, in no acute distress. Head:  normocephalic and atraumatic Eyes:  PERRLA/EOM intact; conjunctiva and lids normal. Neck:  Prominent Left carotid bruit.   Lungs:  Clear bilaterally to auscultation and percussion. Heart:  PMI non displaced. Normal S1 and S2.  No murmur, rub, or gallop Abdomen:  Bowel sounds positive; abdomen soft and non-tender without masses, organomegaly, or hernias noted. No hepatosplenomegaly. Pulses:  pulses normal in all 4 extremities Extremities:  No clubbing or cyanosis. Neurologic:  Alert and oriented x 3.   EKG  Procedure date:  03/11/2010  Findings:      NSR.  Rare PVC.  Otherwise normal tracing.  Impression & Recommendations:  Problem # 1:  CAD, UNSPECIFIED SITE (ICD-414.00) Continues to remain stable.  Followed by Dr. Pete Glatter.  No major changes in approach at this point in time.  continue current treatment. The  following medications were removed from the medication list:    Lisinopril 5 Mg Tabs (Lisinopril) .Marland Kitchen... Take one tablet by mouth daily Her updated medication list for this problem includes:    Toprol Xl 25 Mg Xr24h-tab (Metoprolol succinate) .Marland Kitchen... Take 1 by mouth once daily    Norvasc 10 Mg Tabs (Amlodipine besylate) .Marland Kitchen... Take 1 by mouth once daily    Plavix 75 Mg Tabs (Clopidogrel bisulfate) .Marland Kitchen... Take one tablet by mouth daily    Nitroglycerin 0.4 Mg Subl (Nitroglycerin) ..... One tablet under tongue every 5 minutes as needed for chest pain---may repeat times three    Aspirin 81 Mg Tbec (Aspirin) .Marland Kitchen... Take one  tablet by mouth daily  Orders: EKG w/ Interpretation (93000)  Problem # 2:  HYPERCHOLESTEROLEMIA  IIA (ICD-272.0) Monitored by Dr.Stoneking Her updated medication list for this problem includes:    Lipitor 20 Mg Tabs (Atorvastatin calcium) .Marland Kitchen... Take one tablet by mouth daily.  Problem # 3:  BRUIT (ICD-785.9) Dopplers done today reveal 20-39% bilaterally without high grade obstruction.  Problem # 4:  HYPERTENSION, UNSPECIFIED (ICD-401.9) controlled on current meds.  Dr. Pete Glatter checks her labs.  The following medications were removed from the medication list:    Lisinopril 5 Mg Tabs (Lisinopril) .Marland Kitchen... Take one tablet by mouth daily Her updated medication list for this problem includes:    Toprol Xl 25 Mg Xr24h-tab (Metoprolol succinate) .Marland Kitchen... Take 1 by mouth once daily    Norvasc 10 Mg Tabs (Amlodipine besylate) .Marland Kitchen... Take 1 by mouth once daily    Hydrochlorothiazide 25 Mg Tabs (Hydrochlorothiazide) .Marland Kitchen... Take one tablet by mouth daily.    Aspirin 81 Mg Tbec (Aspirin) .Marland Kitchen... Take one tablet by mouth daily    Losartan Potassium 25 Mg Tabs (Losartan potassium) .Marland Kitchen... Take 1 tablet by mouth once a day  Orders: EKG w/ Interpretation (93000)  Patient Instructions: 1)  Your physician recommends that you continue on your current medications as directed. Please refer to the Current Medication list given to you today. 2)  Your physician wants you to follow-up in:  1 YEAR.  You will receive a reminder letter in the mail two months in advance. If you don't receive a letter, please call our office to schedule the follow-up appointment.

## 2010-04-18 LAB — COMPREHENSIVE METABOLIC PANEL
ALT: 26 U/L (ref 0–35)
AST: 22 U/L (ref 0–37)
Albumin: 2.3 g/dL — ABNORMAL LOW (ref 3.5–5.2)
Alkaline Phosphatase: 123 U/L — ABNORMAL HIGH (ref 39–117)
BUN: 6 mg/dL (ref 6–23)
CO2: 29 mEq/L (ref 19–32)
CO2: 32 mEq/L (ref 19–32)
Calcium: 8.1 mg/dL — ABNORMAL LOW (ref 8.4–10.5)
Chloride: 104 mEq/L (ref 96–112)
GFR calc Af Amer: >60 mL/min (ref >60)
GFR calc non Af Amer: >60 mL/min (ref >60)
Glucose, Bld: 151 mg/dL — ABNORMAL HIGH (ref 70–99)
Potassium: 4 mEq/L (ref 3.5–5.1)
Sodium: 138 mEq/L (ref 135–145)
Total Bilirubin: 0.8 mg/dL (ref 0.3–1.2)
Total Protein: 5.5 g/dL — ABNORMAL LOW (ref 6.0–8.3)

## 2010-04-18 LAB — BASIC METABOLIC PANEL
BUN: 13 mg/dL (ref 6–23)
CO2: 30 mEq/L (ref 19–32)
Calcium: 8 mg/dL — ABNORMAL LOW (ref 8.4–10.5)
Creatinine, Ser: 0.58 mg/dL (ref 0.4–1.2)
Creatinine, Ser: 0.76 mg/dL (ref 0.4–1.2)
GFR calc Af Amer: >60 mL/min (ref >60)
GFR calc Af Amer: >60 mL/min (ref >60)
GFR calc non Af Amer: >60 mL/min (ref >60)
GFR calc non Af Amer: >60 mL/min (ref >60)

## 2010-04-18 LAB — CBC
HCT: 33.8 % — ABNORMAL LOW (ref 36.0–46.0)
Hemoglobin: 11.3 g/dL — ABNORMAL LOW (ref 12.0–15.0)
MCHC: 34.7 g/dL (ref 30.0–36.0)
Platelets: 237 10*3/uL (ref 150–400)
Platelets: 276 10*3/uL (ref 150–400)
RBC: 3.61 MIL/uL — ABNORMAL LOW (ref 3.87–5.11)
RBC: 3.73 MIL/uL — ABNORMAL LOW (ref 3.87–5.11)
RBC: 4.03 MIL/uL (ref 3.87–5.11)
RDW: 12.9 % (ref 11.5–15.5)
WBC: 10.5 10*3/uL (ref 4.0–10.5)
WBC: 15.1 10*3/uL — ABNORMAL HIGH (ref 4.0–10.5)

## 2010-04-19 LAB — COMPREHENSIVE METABOLIC PANEL
ALT: 219 U/L — ABNORMAL HIGH (ref 0–35)
ALT: 29 U/L (ref 0–35)
AST: 142 U/L — ABNORMAL HIGH (ref 0–37)
AST: 54 U/L — ABNORMAL HIGH (ref 0–37)
Albumin: 2.8 g/dL — ABNORMAL LOW (ref 3.5–5.2)
Alkaline Phosphatase: 139 U/L — ABNORMAL HIGH (ref 39–117)
CO2: 29 mEq/L (ref 19–32)
CO2: 32 mEq/L (ref 19–32)
Calcium: 8.4 mg/dL (ref 8.4–10.5)
Calcium: 9.8 mg/dL (ref 8.4–10.5)
Chloride: 97 mEq/L (ref 96–112)
Creatinine, Ser: 0.66 mg/dL (ref 0.4–1.2)
Creatinine, Ser: 0.76 mg/dL (ref 0.4–1.2)
GFR calc Af Amer: >60 mL/min (ref >60)
GFR calc Af Amer: >60 mL/min (ref >60)
GFR calc non Af Amer: >60 mL/min (ref >60)
GFR calc non Af Amer: >60 mL/min (ref >60)
Glucose, Bld: 108 mg/dL — ABNORMAL HIGH (ref 70–99)
Potassium: 3.3 mEq/L — ABNORMAL LOW (ref 3.5–5.1)
Sodium: 136 mEq/L (ref 135–145)
Sodium: 140 mEq/L (ref 135–145)
Total Protein: 5.7 g/dL — ABNORMAL LOW (ref 6.0–8.3)
Total Protein: 5.8 g/dL — ABNORMAL LOW (ref 6.0–8.3)

## 2010-04-19 LAB — CBC
HCT: 37.2 % (ref 36.0–46.0)
Hemoglobin: 12.8 g/dL (ref 12.0–15.0)
Hemoglobin: 14.3 g/dL (ref 12.0–15.0)
MCHC: 34 g/dL (ref 30.0–36.0)
MCHC: 34.4 g/dL (ref 30.0–36.0)
MCV: 89.8 fL (ref 78.0–100.0)
MCV: 90.4 fL (ref 78.0–100.0)
Platelets: 157 10*3/uL (ref 150–400)
Platelets: 161 10*3/uL (ref 150–400)
Platelets: 180 10*3/uL (ref 150–400)
RBC: 3.97 MIL/uL (ref 3.87–5.11)
RBC: 4.67 MIL/uL (ref 3.87–5.11)
RDW: 12.7 % (ref 11.5–15.5)
RDW: 12.8 % (ref 11.5–15.5)
RDW: 13.5 % (ref 11.5–15.5)
WBC: 11.7 10*3/uL — ABNORMAL HIGH (ref 4.0–10.5)
WBC: 11.8 10*3/uL — ABNORMAL HIGH (ref 4.0–10.5)

## 2010-04-19 LAB — HEPATIC FUNCTION PANEL
ALT: 385 U/L — ABNORMAL HIGH (ref 0–35)
AST: 549 U/L — ABNORMAL HIGH (ref 0–37)
Albumin: 3 g/dL — ABNORMAL LOW (ref 3.5–5.2)

## 2010-04-19 LAB — DIFFERENTIAL
Basophils Absolute: 0 10*3/uL (ref 0.0–0.1)
Eosinophils Absolute: 0.1 10*3/uL (ref 0.0–0.7)
Lymphs Abs: 2.3 10*3/uL (ref 0.7–4.0)
Neutrophils Relative %: 53 % (ref 43–77)

## 2010-04-19 LAB — URINALYSIS, ROUTINE W REFLEX MICROSCOPIC
Bilirubin Urine: NEGATIVE
Hgb urine dipstick: NEGATIVE
Specific Gravity, Urine: 1.015 (ref 1.005–1.030)
pH: 7 (ref 5.0–8.0)

## 2010-04-19 LAB — TYPE AND SCREEN: ABO/RH(D): O POS

## 2010-04-19 LAB — ABO/RH: ABO/RH(D): O POS

## 2010-04-19 LAB — AMYLASE: Amylase: 62 U/L (ref 27–131)

## 2010-04-19 LAB — PROTIME-INR: INR: 1.1 (ref 0.00–1.49)

## 2010-05-27 NOTE — Assessment & Plan Note (Signed)
[facility]                            CARDIOLOGY OFFICE NOTE   NAME:Audrey, Foster                         MRN:          [id number]  DATE:11/08/2006                            DOB:          [birth year]    The patient is in for a followup visit. Last week she went out and was  trimming hedges in a butterfly bush. She has had some discomfort that is  really over the area where she has had her lung __________. The  discomfort did go down towards her back. She said Tylenol helped make it  go away and it is not really similar to what she has had in the past.   Today, on examination, blood pressure is 150/80, pulse 69.  The lung fields are actually quite clear.  Cardiac rhythm is regular. I cannot appreciate a significant murmur.  There is no extremity edema.  There is no rub noted.   EKG reveals normal sinus rhythm, essentially within normal limits.   Fortunately, the patient underwent cardiac catheterization back in the  spring of this year. At that time, her stents were patent. There was  some hypodensity in the mid RCA, but it did not appear to be critical.  Her LV function is preserved. We have continued to treat her medically.  She will remain on a medical regimen. She still follows up with Dr.  Coral Spikes.     Arturo Morton. Riley Kill, MD, [facility]  Electronically Signed    TDS/MedQ  DD: 11/08/2006  DT: 11/08/2006  Job #: [id number]

## 2010-05-27 NOTE — Op Note (Signed)
NAMEPERNELLA, ACKERLEY NO.:  [id number]   MEDICAL RECORD NO.:  [id number]          PATIENT TYPE:  INP   LOCATION:  1342                         FACILITY:  [facility]   PHYSICIAN:  Petra Kuba, M.D.    DATE OF BIRTH:  08-24-1930   DATE OF PROCEDURE:  09/07/2008  DATE OF DISCHARGE:                               OPERATIVE REPORT   PROCEDURE:  Esophagogastroduodenoscopy with balloon dilatation of the  pylorus and endoscopic retrograde cholangiopancreatography,  sphincterotomy and stone extraction.   ENDOSCOPIST:  Petra Kuba, M.D.   INDICATIONS FOR PROCEDURE:  A patient with a common bile duct stone,  unable to pass the endoscopic retrograde cholangiopancreatography scope  due to pyloric stenosis and ring.  A consent was signed, after the  risks, benefits, methods and options were thoroughly discussed both  yesterday and today, prior to sedation.   MEDICINES USED:  Fentanyl 100 mcg, Versed 6 mg, Benadryl 25 mg.   DESCRIPTION OF PROCEDURE:  First a side-viewing therapeutic video  duodenoscope was inserted by indirect vision into the stomach and  advanced to the pylorus.  Unfortunately she had a pyloric stenosis and a  ring and we were unable, despite multiple efforts, to advance the side-  viewing scope.  We elected to withdraw the scope and advance the regular  endoscope, and again could not advance the regular endoscope which was  smaller, through the pylorus.  We went ahead and using the  sphincterotome injected dye into the duodenum, to give [place] a road map of  the duodenum.  I went ahead and then advanced the 5.5 cm dilating  limb,  adjustable 10 mm to 12 mm, which was confirmed in the proper position,  both under fluoroscopy and endoscopically.  We first inflated to 10 mm  and held it for 30 seconds, and then increased it to 12 mm and held it  for one minute.  Once the minute was over, the balloon was withdrawn and  we were able to advance the scope with gentle  resistance into the  duodenum.  A normal-appearing second portion of the duodenum was  confirmed.  The duodenal bulb was normal.  Based on the difficulty in  still advancing the scope, we went ahead and advanced the 12 mm to 15 mm  adjustable balloon 5.5 cm into the duodenum under endo guidance and  withdrew back to the stomach and inflated the balloon to 13.5 mm for one  minute.  The balloon and scope were then withdrawn after some of the air  and water were suctioned.  A good look at the esophagus was normal.  We  then inserted the endoscopic retrograde cholangiopancreatography scope  as above, and this time with minimal resistance were able to advance  into the second portion of the duodenum.  A normal-appearing ampulla was  brought into view.  Using the triple lumen sphincterotome loaded with  the JAG wire, deep selective cannulation was obtained on the first  attempt.  The CBD was slightly dilated and at least three stones were  seen on initial injection.  There were no PD injections or wire  advancements throughout the procedure.  We went ahead and proceeded with  a medium-sized sphincterotomy in the customary fashion, until we were  able to get adequate biliary drainage and were able to get the Foley  sphincterotome easily in and out of the duct.  We then exchanged the  sphincterotome for the 12 mm to 15 mm adjustable balloon and proceeded  with poor balloon pull-through.  The first two stones were withdrawn on  the first one, and then one on the second one.  The two subsequent did  not deliver any stones.  The 12 mm balloon passed readily through the  ampulla with exception of some minimal resistance at the end.  We went  ahead and proceeded with an occlusion cholangiogram at this juncture,  which we did twice, first inflating the balloon to 12 mm, and then  inflating the balloon to 15 mm.  Unfortunately, the stone with her being  on her side for this procedure, due to her previous  surgery and not  being able to lie on her belly, which she actually had even before this  surgery, we could not get great occlusion pictures, with too much air in  the system, but on the occlusion cholangiograms, no obvious  abnormalities were seen.  The 15 mm was withdrawn back to the ampulla  and lowered to the 12 mm size, and then was able to be removed readily.  We ended the procedure at this juncture.  The scope was removed.   The patient tolerated the procedure well.  There were no obvious  immediate complications.   ENDOSCOPIC DIAGNOSES:  1. Pyloric stenosis, unable to pass the endoscopic retrograde      cholangiopancreatography scope.  2. Proceeded with an endoscopy and balloon dilatation under      fluoroscopy of the pylorus to 13.5 mm.  Able to pass the endoscope      after the 12 mm dilation.  3. No other obvious endoscopy findings.  4. Able to pass the endoscopic retrograde cholangiopancreatography      scope after delaying dilatation.  5. Normal ampulla.  6. No __________ injection or wire advancement.  7. Previous common bile duct stents delivered, status post medium      sphincterotomy and multiple 12 mm balloon pull-throughs.  8. Difficult to proceed with occlusion cholangiogram, tried twice, but      no obvious stone seen.   PLAN:  Observe for delayed complications.  Hold aspirin if we are able  to for a short time, and hopefully home in the morning, if no delayed  complications or other problems.           ______________________________  Petra Kuba, M.D.     MEM/MEDQ  D:  09/07/2008  T:  09/07/2008  Job:  [id number]   cc:   Theressa Millard, M.D.  Fax: [phone]   Currie Paris, M.D.  [address]  [place]  [place] [address]   Arturo Morton. Riley Kill, MD, [facility]  [address]  [address]  [place]  [place] [address]

## 2010-05-27 NOTE — Assessment & Plan Note (Signed)
[facility]                            CARDIOLOGY OFFICE NOTE   ALDA, GAULTNEY                         MRN:          [id number]  DATE:12/26/2007                            DOB:          [birth year]    ADDENDUM   Ms. Belote's chest discomfort was clearly atypical.  She said it is  nothing like, which she has had in the past and she thinks it may be  from the pressure from the mammogram.  She had no associated symptoms.  Based on this, she is to call us if there are any change or progression  of her symptoms in the next couple of days.     Arturo Morton. Riley Kill, MD, [facility]     TDS/MedQ  DD: 12/26/2007  DT: 12/27/2007  Job #: [id number]   cc:   Demetria Pore. Coral Spikes, M.D.

## 2010-05-27 NOTE — Letter (Signed)
October 19, 2007   Demetria Pore. Coral Spikes, MD  [address], [address] [place], [place] [address]   Re:  KIERRA, JEZEWSKI                  DOB:  1930-04-04   Dear Theron Arista,   I saw the patient back today.  Her blood pressure is 154/81, pulse 71,  respirations 18, and sats were 94%.  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  We  followed her for 5 years with no evidence of recurrence.  We will refer  her back to you for long-term followup, but it was suggested she get at  least a chest x-ray or a CT scan once a year.  At this time, her chances  of recurrence of a carcinoid are very unlikely.  I appreciate the  opportunity of taking care of the patient.   Ines Bloomer, M.D.  Electronically Signed   DPB/MEDQ  D:  10/19/2007  T:  10/19/2007  Job:  [id number]

## 2010-05-27 NOTE — Consult Note (Signed)
NAMETARRY, BLAYNEY NO.:  [id number]   MEDICAL RECORD NO.:  [id number]          PATIENT TYPE:  INP   LOCATION:  1342                         FACILITY:  [facility]   PHYSICIAN:  Petra Kuba, M.D.    DATE OF BIRTH:  Aug 05, 1930   DATE OF CONSULTATION:  09/06/2008  DATE OF DISCHARGE:                                 CONSULTATION   PHYSICIAN REQUESTING CONSULTATION:  Currie Paris, M.D.   HISTORY:  The patient is seen at the request of Dr. Jamey Ripa for a  positive intraop cholangiogram and consideration of ERCP.  She is having  a little bit of soreness from her operation but is better than she was.  She has had in the past a colonoscopy and endoscopy probably greater  than 5 years before.  She said she was put to sleep, did not have any  significant findings and did not have any problems with them.   PAST MEDICAL HISTORY:  Pertinent for:  1. Some heart issues followed by Dr. Riley Kill.  2. As well as some hypertension.  3. She had a carcinoid removed from the lung.  4. Increased cholesterol.  No other significant medical issues.   MEDICINES AT HOME:  Include potassium, Toprol, Norvasc, HCTZ,  multivitamins, calcium, Ativan, Lipitor, Plavix which has been on hold  for a week and nitroglycerin.   ALLERGIES:  INCLUDE PENICILLIN, CODEINE, OXYCONTIN, CELEBREX AND ZOLOFT.   FAMILY HISTORY:  Pertinent for her two sisters with gallbladder issues.   SOCIAL HISTORY:  Does not smoke and drink.   Currently she is on a baby aspirin as well.   REVIEW OF SYSTEMS:  Negative except above.   PHYSICAL EXAM:  NECK:  Supple without adenopathy.  She does have a left  bruit based on the chart.  LUNGS:  Clear.  HEART:  Regular rate and rhythm.  ABDOMEN:  Still sore as she is in the postop condition.  Rare but  positive bowel sounds.   LABORATORIES:  Pertinent for a slight increase in liver tests this  morning.  Slight increase in white count as well.  Essentially normal  labs  preop.  Intraoperative cholangiogram was reviewed:  Pertinent for  an obvious stone.   ASSESSMENT:  Positive intraoperative cholangiogram for a common bile  duct stone.   PLAN:  Risks, benefits, methods of an endoscopic retrograde  cholangiopancreatography and stone removal were discussed with the  patient including success rate and complications.  We will proceed  tomorrow at 9:00 a.m.  Unfortunately, there were some scheduling issues today and we were  unable to do it but actually the patient is glad to have a day of rest.  We did compare these procedures to her previous endoscopy and  colonoscopy with further workup and plans pending those findings.           ______________________________  Petra Kuba, M.D.     MEM/MEDQ  D:  09/06/2008  T:  09/06/2008  Job:  [id number]   cc:   Arturo Morton. Riley Kill, MD, [facility]  [address]  [place]  [place] [address]   Currie Paris, M.D.  [address], [address]  [place]  [place] [address]   Theressa Millard, M.D.  Fax: [phone]

## 2010-05-27 NOTE — Op Note (Signed)
NAMESHARLIE, SHREFFLER                  ACCOUNT NO.:  [id number]   MEDICAL RECORD NO.:  [id number]          PATIENT TYPE:  AMB   LOCATION:  DAY                          FACILITY:  [facility]   PHYSICIAN:  Currie Paris, M.D.DATE OF BIRTH:  31-May-1930   DATE OF PROCEDURE:  09/05/2008  DATE OF DISCHARGE:                               OPERATIVE REPORT   OFFICE MEDICAL RECORD NUMBER [id number] [phone].   PREOPERATIVE DIAGNOSIS:  Chronic calculous cholecystitis.   POSTOPERATIVE DIAGNOSIS:  Chronic calculous cholecystitis plus  choledocholithiasis.   OPERATION PERFORMED:  Laparoscopic cholecystectomy with operative  cholangiogram.   SURGEON:  Currie Paris, M.D.   ASSISTANT:  Gaynelle Adu, MD   ANESTHESIA:  General.   CLINICAL HISTORY:  This is a 77 year old lady who has been having  intermittent biliary type symptoms and a finding of gallstones.  She  elected to proceed to cholecystectomy.   DESCRIPTION OF PROCEDURE:  The patient was seen in the holding area.  She had no further questions.  She confirmed plans as noted above.   The patient was then taken to the operating room and after satisfactory  general endotracheal anesthesia had been obtained, the abdomen was  prepped and draped.  The time-out was done.   I used 0.25% plain Marcaine for each incision.  The abdominal incision  at the umbilicus was made, the fascia entered and the peritoneal cavity  entered.  She was insufflated to 15.   There are some adhesions in the right upper quadrant of the omentum to  the anterior abdominal wall such that I could not visualize well to put  the trocar in the epigastrium in.  She then had two 5 mm trocars placed  under direct vision laterally and these adhesions were taken down  sharply.   With the visualization now better, I put a 10/11 trocar in the  epigastrium.  The gallbladder was grasped and retracted over the liver  but was very thin-walled and dilated, so we spilled some bile in  doing  that.   I was able to dissect in the area the triangle of CLO and identify and  dissect out a long segment of cystic duct and cystic artery and with a  nice window behind.   I put one clip on the duct and one on the artery.   A Cook catheter was introduced percutaneously, the duct opened, and the  catheter inserted for the cholangiogram.  The operative cholangiogram  showed what appeared to be at least one if not a few common duct stones  with a the dilated duct and no flow in the duodenum.  I did two  injections trying to see if we could get some flow but it appeared that  there was a stone distally.   I did not think that this would be a candidate for an intraoperative  common duct exploration as we had a very small thin walled cystic duct  and I therefore elected to place 3 clips on it and divided.  Additional  clips were placed on the artery and it was divided.  The gallbladder was  removed from below to above with coagulation current of the cautery with  minimal bleeding.  It was placed in a bag.   I spent several minutes irrigating and getting the spilled bile out.  The gallbladder was then brought out the umbilical port.   We reinsufflated to make sure everything was dry and there was no  remaining irrigant.  The lateral ports were removed under direct vision.  The umbilical site was closed with a pursestring.  The abdomen is  deflated through the epigastric port.  The skin was closed with 4-0  Monocryl subcuticular plus Dermabond.   The patient tolerated the procedure well and there were no  complications.  All counts were correct.      Currie Paris, M.D.  Electronically Signed     CJS/MEDQ  D:  09/05/2008  T:  09/05/2008  Job:  [id number]   cc:   Demetria Pore. Coral Spikes, M.D.  Fax: [phone]

## 2010-05-27 NOTE — Assessment & Plan Note (Signed)
OFFICE VISIT   [name], [name]  DOB:  1930/12/15                                        April 13, 2007  CHART #:  [id number]   [name] came for followup today.  Her chest x-ray is stable, 4-1/2 years  since her surgery.  She is feeling tired lately since her Toprol has  been changed.  Her blood pressure is 151/85,  pulse 78, respirations 18,  saturations are 97%.  I will see her back again in 6 months with a CT  scan and that will be 5 years.   Ines Bloomer, M.D.  Electronically Signed   DPB/MEDQ  D:  04/13/2007  T:  04/13/2007  Job:  [id number]

## 2010-05-27 NOTE — Assessment & Plan Note (Signed)
[facility]                            CARDIOLOGY OFFICE NOTE   Khari, Lett CHEMERE STEFFLER                         MRN:          [id number]  DATE:07/01/2006                            DOB:          1930-07-30    [name] is in for followup. Since taking a higher dose of potassium she  really is feeling quite a bit better. She is feeling more energetic and  strong. Her last magnesium was within the normal range, but her  potassium has been on the lower side. We have increased her potassium to  20 mEq p.o. b.i.d. in combination with her hydrochlorothiazide 25 mg  daily. She actually has been feeling quite well.   MEDICATIONS:  1. Toprol XL 25 mg daily.  2. Norvasc 10 mg daily.  3. Hydrochlorothiazide 25 mg one daily.  4. Enteric-coated aspirin 325 mg daily.  5. Multivitamin one daily.  6. Caltrate daily.  7. Lipitor 20 mg q nightly.  8. Plavix 75 mg daily.  9. Premarin 0.3 mg daily.  10.Lorazepam daily.  11.Klor-Con 20 mEq b.i.d.   PHYSICAL EXAMINATION:  She is well-appearing, weighs 184. Blood pressure  is 130/78, pulse 80.  The lung fields are clear.  There is no extremity edema.  Cardiac rhythm is regular.   Overall, the patient is doing well. She has coronary artery disease and  bilateral carotid disease. Her blood pressure is under good control. She  has hypokalemia which was diuretic induced. We will recheck her basic  metabolic profile and her magnesium and I will plan to see her back in  followup in three months. At that time, we will consider whether to  readjust her anti-platelet therapy.     Arturo Morton. Riley Kill, MD, [facility]  Electronically Signed    TDS/MedQ  DD: 07/01/2006  DT: 07/01/2006  Job #: [id number]   cc:   Demetria Pore. Coral Spikes, M.D.

## 2010-05-27 NOTE — Letter (Signed)
July 06, 2007    Lennox Pippins, MD  [address]  [address]  [place], [place]  [address]   RE:  [name], [name]  MRN:  [id number]  /  DOB:  [birth year]   Dear Theron Arista,   I had the pleasure of seeing your very nice patient, [name] in the office  today in followup.  I am in receipt of Dr. Scheryl Darter note and they are  continuing to follow up on her lung lesion.  Cardiac wise, I think she  has got along well.  She does get a little bit of shortness of breath  when she walks up hills, bends over and lifts, but no anginal-type  symptoms.  She has some atypical sharp right-sided chest discomfort,  which she thinks is related residually to her prior surgery.  Otherwise,  she has got along well and I realize from looking at her labs that you  sent that her LDL and HDL are both optimal.   CURRENT MEDICATIONS:  1. Toprol XL 25 mg daily.  2. Norvasc 10 mg daily.  3. Hydrochlorothiazide 25 mg daily.  4. Aspirin 325 mg daily.  5. Multivitamin daily.  6. Caltrate D 600 mg 2 tablets daily.  7. Lipitor 20 mg daily.  8. Plavix 75 mg daily.  9. Premarin 0.3 mg daily.  10.Klor-Con 20 mEq b.i.d.   PHYSICAL EXAMINATION:  GENERAL:  She is alert and oriented in no acute  distress.  VITALS:  Blood pressure is 140/70, pulse 71.  LUNGS:  Fields are clear.  CARDIAC:  Rhythm is regular.  No significant murmur is noted.   EKG reveals normal sinus rhythm, it is within normal limits.   Overall, this nice lady has done well.  We will get a basic metabolic  profile.  Her last potassium was 3.9 but I think this needs to be  checked quarterly.  We will not make any major changes in her overall  medical regimen.  I suspect that there could be gradual alteration in  her antiplatelet therapy.   I appreciate the opportunity of sharing in this nice lady's care and we  will see her back in followup in 6 months' time.  Should she have any  changes in the interim, please feel free to call us.   One additional  note was that she remains to have a left carotid bruit.  She is scheduled for a followup Doppler ultrasound.  I will send the  results of these to you.    Sincerely,      Arturo Morton. Riley Kill, MD, [facility]  Electronically Signed    TDS/MedQ  DD: 07/06/2007  DT: 07/07/2007  Job #: [id number]

## 2010-05-27 NOTE — Assessment & Plan Note (Signed)
[facility]                            CARDIOLOGY OFFICE NOTE   RODNESHA, ELIE                         MRN:          [id number]  DATE:12/26/2007                            DOB:          February 08, 1930    Mr. Campus is in for followup.  Today, different than most days, she had  a little bit of hurting in the left chest.  She says it is was sharp  shooting pain, and she got quite anxious about it.  It is nothing like  what she has had before.  She says her previous cardiac pain knocked her  down.  She had diaphoresis and shortness of breath but today had none of  the above.  She has also had a mammogram a few days ago, and at the time  of the mammogram, she said it was harder than it has ever been before  due to pressure on the chest.  Otherwise, she has been perfectly stable  without any progressive symptoms.  She has seen Dr. Edwyna Shell and  subsequently been released from his care after no evidence of right  lower lobe recurrence of metastatic disease.   MEDICATIONS:  1. Toprol-XL 25 mg daily.  2. Norvasc 10 mg daily.  3. Hydrochlorothiazide 25 mg daily.  4. Aspirin 325 mg daily.  5. Multivitamin 1 daily.  6. Caltrate D 2 daily.  7. Lipitor 20 mg nightly.  8. Plavix 75 mg daily.  9. Premarin 0.3 mg daily.  10.Klor-Con 20 mEq b.i.d.   PHYSICAL EXAMINATION:  GENERAL:  She is alert and oriented.  VITAL SIGNS:  Blood pressure is 164/88, pulse 69.  HEART:  The cardiac rhythm is regular.  There is a minimal systolic  ejection murmur noted.  There is a left carotid bruit noted, which has  been previously noted as well.  The remainder of examination is  unremarkable.   Last cardiac catheterization April 15, 2006, revealed preserved global  systolic function with no left main disease.  The LAD was patent with  proximally placed Pixel stent, which did not demonstrate significant  restenosis.  The rest of the vessel was calcified but without critical  disease.  The circumflex had a PTCA distally, and this was widely  patent.  There is calcification of the midvessel of the right coronary  but without critical abnormalities.   EKG today reveals sinus rhythm with minor nonspecific ST abnormality but  essentially a normal tracing.  There is prominent voltage.   IMPRESSION:  1. Coronary artery disease with prior percutaneous coronary      interventions.  2. Hypertension.  3. Somewhat atypical chest pain with unremarkable EKG.   PLAN:  1. Continue follow up with Dr. Lennox Pippins (recent potassium 3.8).  2. Continue current medical regimen.   ADDENDUM:  Ms. Marina chest discomfort was clearly atypical.  She said  it is nothing like, which she has had in the past and she thinks it may  be from the pressure from the mammogram.  She had no associated  symptoms.  Based on this, she is to  call us if there are any change or  progression of her symptoms in the next couple of days.     Arturo Morton. Riley Kill, MD, [facility]  Electronically Signed    TDS/MedQ  DD: 12/26/2007  DT: 12/27/2007  Job #: [id number]   cc:   Demetria Pore. Coral Spikes, M.D.

## 2010-05-27 NOTE — Letter (Signed)
September 30, 2006   Demetria Pore. Coral Spikes, M.D.  [address]  [address]  [place], [place] [address]   Re:  [name], [name]                  DOB:  02-Apr-1930   Dear [name]:   I saw Ms. [name] for follow up today. She is now 4 years since we did her  surgery for an atypical carcinoid. She is doing remarkably well. Her  blood pressure is 141/77, pulse 76, respirations 18, saturations were  98%. Lungs were clear to auscultation and percussion. Heart reverted to  sinus rhythm with no murmurs. I will see her back again in six months  with a chest x-ray.   Ines Bloomer, M.D.  Electronically Signed   DPB/MEDQ  D:  09/30/2006  T:  10/01/2006  Job:  [id number]

## 2010-05-30 NOTE — H&P (Signed)
NAME:  [name], [name]                            ACCOUNT NO.:  [id number]   MEDICAL RECORD NO.:  [id number]                   PATIENT TYPE:  INP   LOCATION:  NA                                   FACILITY:   PHYSICIAN:  Ines Bloomer, M.D.              DATE OF BIRTH:  27-Jun-1930   DATE OF ADMISSION:  DATE OF DISCHARGE:                                HISTORY & PHYSICAL   HISTORY OF PRESENT ILLNESS:  This is a 77 year old female who has a history  of multiple pulmonary nodules which have been followed with six month CT  scans.  Unfortunately, one of these nodules, specifically in the right lower  lobe has grown from 12 to 15 mm within six month period.  [name] has no  history of smoking or family history of cancer.  Her PFTs showed an FVC of  2.21 and an FEV1 of 1.93.   [name] was referred to Dr. Edwyna Shell by Dr. Coral Spikes for this increase in  the right lower lobe nodule.  She was seen by Dr. Edwyna Shell on November 08, 2002  at which time she underwent a three month CT scan which showed the right  lower lobe nodule had increased to  20 mm in diameter from 15 mm.  At that time, Dr. Edwyna Shell recommended a VATS  procedure for removal of the nodule.  The procedure would be for diagnosis  because although the nodule appears to be benign on CT scan, it could be an  enlarging granuloma, carcinoid or enlarging hematoma.   Currently, [name] complains of a mild scratchy throat.  She states that  she has taken some Chloraseptic cough drops which have relieved the sore  throat.  She denies any cough associated with this sore throat as well as  any fever, chills, night sweats, nausea, vomiting, shortness of breath,  dyspnea on exertion.   ALLERGIES:  1. PENICILLIN causes rash.  2. CODEINE causes nausea.   CURRENT MEDICATIONS:  1. Norvasc 7.5 mg p.o. daily.  2. Metoprolol 100 mg p.o. daily.  3. Premarin 0.625 mg p.o. daily.  4. Lipitor 10 mg p.o. q.h.s.  5. Aspirin 325 mg p.o. daily.  6. Multivitamin one tablet p.o. daily.  7. Caltrate and vitamin D 1200 mg p.o. daily.  8. Nitro tab 0.4 mg sublingual p.r.n. for chest pain.   PAST MEDICAL HISTORY:  1. Coronary artery disease status post myocardial infarction in November     2001 and stent placement at that time.  2. History of pneumonia in 2002.  3. Status post hysterectomy in 1961.  4. History of bilateral glaucoma laser repair several years ago.   SOCIAL HISTORY:  Mrs. [name] is retired and lives in [place], [place].  She is divorced and has one son.  She denies any tobacco or alcohol use.   FAMILY HISTORY:  Significant family history of coronary artery disease  in  her father, mother and two sisters.  There is also family history of  diabetes in her sister and brother.  The patient denies any family history  of cancer, pulmonary problems or bleeding disorders.   PERTINENT REVIEW OF SYSTEMS:  A 12 point review of systems was taken and is  positive for the following:  HEENT:  Remote history of migraines and  dizziness which both are resolved.  The patient wears glasses. The patient  admits to tinnitus which has been present since she was in her 38s.  The  patient has partials in the upper and lower palate, but only wears them  occasionally.  HEMATOLOGIC:  The patient admits to easy bruising which she  attributes to long term aspirin use.  PULMONARY:  The patient admits to  history of bronchitis and history of pneumonia and shortness of breath on  exertion. CARDIAC:  She admits to lower extremity edema, occasional  palpitations and a known left carotid bruit.  The carotid bruit has been  followed with ultrasound and she was not found to have significant stenosis  for surgical repair.   PHYSICAL EXAMINATION:  VITAL SIGNS:  Blood pressure 140/80 in the left arm,  pulse is 80 and regular, respirations 16 and unlabored.  He height is 5 feet  6 inches and her weight is 185 pounds.  GENERAL:  This is a  well-developed, well-nourished 77 year old female who is  very well groomed and in no acute distress.  SKIN:  Does have evidence of easy bruising and is very thin without turgor.  HEENT:  EOMI.  Conjunctivae are clear.  Sclerae are anicteric.  Pupils are  equal, round and reactive to light and accommodation.  Her hearing is within  normal limits.  She does have evidence of multiple missing teeth for which  she wears partials in upper and lower palate although she is not wearing  them today.  There are no sores or exudate and her oropharynx is clear.  NECK:  Supple without lymphadenopathy or thyromegaly.  There is a soft left  carotid bruit and no evidence of bruit on the right.  CHEST:  Lungs are clear to auscultation without wheezes, crackles or rales.  She has no spinal or CVA tenderness.  HEART:  Regular  rate and rhythm with soft systolic ejection murmur heard  best over the second intercostal space without radiation.  Peripheral pulses  are as follows:  2+ carotid, radial, femoral and dorsalis pedis pulses  bilaterally. She has very minimal venous insufficiencies on her lower  extremities.  She has trace dependent pedal edema.  ABDOMEN:  Soft, nontender, nondistended with good bowel sounds.  She has no  organomegaly or masses.  MUSCULOSKELETAL/NEUROLOGIC:  She has 5/5 strength in her upper and lower  extremities throughout.  Her sensation is grossly intact.  Her cranial  nerves II-XII are intact.   IMPRESSION:  This is a 77 year old female with history of pulmonary nodules  of unknown etiology.  The right lower lobe nodule has been enlarging over  the last nine months.   PLAN:  We will proceed with a right VATS, wedge resection of right lower  lobe nodule and possible right lower lobectomy on November 15, 2002 with Dr.  Edwyna Shell.  The patient has been seen by Dr. Edwyna Shell in clinic and the risks, complications, alternatives to procedure were discussed with the patient at  that time and  she agreed to proceed with surgery.      Carolyn A.  Eustaquio Boyden.                  Ines Bloomer, M.D.    CAF/MEDQ  D:  11/13/2002  T:  11/13/2002  Job:  [id number]

## 2010-05-30 NOTE — Assessment & Plan Note (Signed)
[facility]                            CARDIOLOGY OFFICE NOTE   DEVANY, AJA                         MRN:          [id number]  DATE:05/05/2006                            DOB:          1930-05-31    Mr. Wente is in today for a followup visit.  Yesterday she had some  chest discomfort.  This occurred under the left inframammary region and  she said every time she lifted her breast up she felt better.  It is  nothing like what she had in 2001 when she presented to Dr. Janalyn Shy  office and had a cardiac arrest in his office.  That was heaviness in  the anterior chest.  Importantly, the patient did not have nausea,  vomiting, diaphoresis or shortness of breath.  Today she arrived and her  EKG does not show any significant major abnormalities suggestive of an  event.   PHYSICAL EXAMINATION:  The blood pressure is 172/80 and the pulse is 77.  LUNGS:  Are clear.  CARDIAC:  Rhythm is regular.  EXTREMITIES:  Reveal no edema.  Blood pressures are equal bilaterally.   Ms. Swann has had some discomfort which is somewhat atypical.  She has  had this on several occasions in the past.  She did not have associated  symptoms.  I talked about the possibility of bringing her into the  hospital with consideration towards cardiac catheterization, but she was  clearly disinterested in doing that.  We talked about an alternative  such as doing an adenosine Cardiolite scan here in the office.  We will  try to get that arranged at her request for tomorrow.  I think an  adenosine would probably be the best way to go.  She did have an  unremarkable radionuclide imaging study in 2001 and 6 months later had  an acute coronary event, but as expected, these items are not  necessarily predictive.  Based on all of this, I doubt that the current  symptoms are cardiac in etiology, but cannot be absolutely sure and I  told her we needed to be extraordinary careful about  this.     Arturo Morton. Riley Kill, MD, [facility]  Electronically Signed    TDS/MedQ  DD: 05/05/2006  DT: 05/05/2006  Job #: [id number]   cc:   Demetria Pore. Coral Spikes, M.D.

## 2010-05-30 NOTE — Assessment & Plan Note (Signed)
[facility]                            CARDIOLOGY OFFICE NOTE   RUTHER, EPHRAIM                         MRN:          [id number]  DATE:05/11/2006                            DOB:          Oct 05, 1930    [name] is in today for followup. She is continuing to feel fatigued.  Importantly, the patient underwent radionuclide imaging suggesting  inferior ischemia. Previously, she had no significant ischemia and  several months later had an infarct with ventricular fibrillation in her  doctor's office. She last underwent catheterization in 2001, and at that  time, she had stenting of the left anterior descending artery as well as  circumflex arteries. She was also treated with TPA and had LAD stenting  in 2001 as well. She recently had an episode of chest pain which  resolved. Her main symptom mainly is fatigue at this point in time.   PHYSICAL EXAMINATION:  VITAL SIGNS:  On physical today, the blood  pressure was 148/76, pulse 76.  LUNGS:  Lung fields are clear.  CARDIAC:  Rhythm is regular.   Importantly, the patient has not had recurrent chest pain and her EKG  today shows no ischemic changes. I talked to her about the various  options. With her known prior history with multivessel intervention, and  also with her history of having ventricular fibrillation in her doctor's  office, we have recommended that she should undergo cardiac  catheterization for further delineation of her coronary anatomy. She  understands the risks and we are going to try to set this up for [facility] tomorrow.     Arturo Morton. Riley Kill, MD, [facility]  Electronically Signed    TDS/MedQ  DD: 05/11/2006  DT: 05/11/2006  Job #: [id number]   cc:   Demetria Pore. Coral Spikes, M.D.

## 2010-05-30 NOTE — Op Note (Signed)
[name], [name]                            ACCOUNT NO.:  [id number]   MEDICAL RECORD NO.:  [id number]                   PATIENT TYPE:  INP   LOCATION:  2875                                 FACILITY:  [facility]   PHYSICIAN:  Ines Bloomer, M.D.              DATE OF BIRTH:  [birth year]   DATE OF PROCEDURE:  11/15/2002  DATE OF DISCHARGE:                                 OPERATIVE REPORT   PREOPERATIVE DIAGNOSIS:  Enlarging right lower lobe lesion.   POSTOPERATIVE DIAGNOSIS:  Papillary tumor, probably papillary cancer, right  lower lobe.   OPERATION:  Right video-assisted thoracoscopic surgery, right vertical  thoracotomy, right lower lobectomy and node dissection.   SURGEON:  Ines Bloomer, M.D.   ASSISTANT:  Toribio Harbour, N.P.   DESCRIPTION OF PROCEDURE:  After general anesthesia, the patient was turned  to the right lateral thoracotomy position, and was prepped and draped in the  usual sterile manner.  Two trocar sites were made in the anterior and the  posterior axillary line at the seventh intercostal space.  The 30-degree  scope was inserted, and no intrathoracic lesions could be seen.  A 7.0 cm  incision was made along the sternocleidomastoid over the fifth intercostal  space.  This was a vertical incision.  The latissimus was reflected  laterally.  The serratus was split, and two Tuffier's were placed at right  angles.  The lesion was palpated in the medial basilar segment of the right  lower lobe.  It was wedged out with the TLC-75 and the EZ-45 stapler.  The  frozen section revealed a papillary tumor, probably a papillary cancer.  For  this reason it was decided to do a right lower lobectomy.  The inferior  pulmonary ligament was taken down with the electrocautery and the posterior  mediastinum was dissected out, dissecting out some 7R nodes.  Some 9R nodes  were dissected out at the inferior pulmonary vein, and it was dissected out  and looped with a vascular  tape.  Attention was turned to the fissure, and the inferior portion fo the fissure  was divided with an EZ-45 stapler.  The superior portion of the fissure was  also divided with the EZ-45 stapler.  Several 11R nodes were dissected free,  and one 10R node was dissected free from around the pulmonary artery.  The  pulmonary artery was looped and the stapling divided with the auto-suture  stapler, dividing the pulmonary artery to the right lower lobe.  The  inferior pulmonary artery was divided with the auto-suture stapler, dividing  this to the right lower lobe.  The bronchus was then stable.  The TL-30  Ethicon stapler was then divided distally.  The right lower lobe was  removed.  A pleural flap was placed on the bronchial stump, and sutured in  place with #3-0 silk.  A Marcaine block was done in  the usual fashion.  Two  chest tubes were placed through the trocar sites and tied in place with #0  silk.  The lung was re-expanded.  The chest was closed with #2 chromic  pericostals.  The On-Q catheters were placed in the usual fashion.  The  muscle was closed with #1 Vicryl.  The subcutaneous tissues were closed with  #3-0 Vicryl.  The patient was returned to the recovery room in stable condition.                                               Ines Bloomer, M.D.    DPB/MEDQ  D:  11/15/2002  T:  11/15/2002  Job:  [id number]

## 2010-05-30 NOTE — Cardiovascular Report (Signed)
[place]. [facility]  Patient:    [name], [name]                         MRN: [id number] Proc. Date: 12/08/99 Adm. Date:  [id number] Attending:  Lenoria Farrier CC:         Arturo Morton. Riley Kill, M.D. [facility]  Carron Curie, M.D., [facility]  Cardiopulmonary Laboratory   Cardiac Catheterization  PROCEDURES PERFORMED:  Percutaneous coronary intervention.  CLINICAL HISTORY:  Ms. Heck is 77 years old and had a recent anterior wall myocardial infarction complicated by ventricular fibrillation arrest and was resuscitated in Dr. Sherilyn Cooter office.  She was studied at [facility] by Dr. Juliann Pares and transferred to [place] for intervention.  We did a complex intervention on the LAD stenting and proximal LAD with IVUS guidance on Friday.  Because is was a long and difficult procedure we staged the circumflex artery to today.  DESCRIPTION OF PROCEDURE:  The procedure was performed via the left femoral artery using an arterial sheath and 7 French 3.5 Voda guiding catheter.  We used a long Patriot wire and crossed the lesion in the mid and distal circumflex artery without difficulty.  We went in with a 2.25 x 10 mm Cutting Balloon and performed a total of five inflations up to 8 atmospheres for 40 seconds.  We then removed the Cutting Balloon and direct stented the mid lesion with 2.5 x 15 mm NIR Elite stent deploying this with one inflation of 15 atmospheres for 47 seconds.  We then felt that the stent was slightly undersized and postdilated with a 3.0 x 12 Quantum Ranger with two inflations of 13 and 12 atmospheres for 32 and 33 seconds at the distal and proximal edge, respectively.  Repeat diagnostic studies were then performed through the guiding catheter.  The patient tolerated the procedure well and left the laboratory in satisfactory condition.  RESULTS:  Initially the stenosis in the distal circumflex artery was estimated at 90%.  Following stenting this  improved to 10%.  There was slight ectasia in the region of the balloon dilatation following the intervention.  There initially was some slight decreased flow in a small side branch, but the flow normalized by the end of the procedure.  The stenosis in the mid circumflex artery was initially estimated at 80% and following stenting this improved to 0%.  There was a small side branch which had some moderate narrowing in the ostium following stent deployment but no decrease in flow.  CONCLUSIONS: 1. Successful Cutting Balloon angioplasty of the distal circumflex lesion    with improvement in percent diameter narrowing from 90% to 10%. 2. Successful stenting of the mid circumflex lesion with improvement in    percent diameter narrowing from 80% to 0%.  DISPOSITION:  The patient was returned to the postangioplasty unit for further observation. DD:  12/08/99 TD:  12/08/99 Job: [id number]

## 2010-05-30 NOTE — Assessment & Plan Note (Signed)
[place] [facility]                              CARDIOLOGY OFFICE NOTE   NAME:[name], [name]                         MRN:          [id number]  DATE:09/01/2005                            DOB:          Sep 17, 1930    This is a pleasant 77 year old white female patient who has a history of  coronary artery disease, status post possible cardiac arrest treated with  TPA and LAD stenting followed by elective stenting to the circumflex in  2001.  She most recently saw Dr. Riley Kill back in May 2007 at which time he  did a routine exercise treadmill which was felt to be nondiagnostic due to  borderline EKG changes but without significant symptoms and nondiagnostic  changes.  The patient comes in today complaining of overall not feeling  well.  She has constant nausea and right upper quadrant abdominal  discomfort.  She got a call today from Dr. Katina Degree office that she does  have gallstones and she is waiting to hear back from Dr. Coral Spikes this  afternoon on that.  She denies any chest burning or significant shortness of  breath like when she had her MI and said she just overall feels bad.  She  also has panic attacks and feels jittery at times and her blood pressure has  been up the last two visits here in the office although she says her office  visit at Dr. Katina Degree two days ago her blood pressure was normal.   CURRENT MEDICATIONS:  1. Toprol-XL 25 mg daily.  2. Norvasc 10 mg daily.  3. Hydrochlorothiazide 25 mg one half daily.  4. Aspirin 325 mg daily.  5. Multivitamin daily.  6. Caltrate with C two daily.  7. Lipitor 20 mg at night.  8. Plavix 75 mg at night.  9. Premarin 0.3 mg daily.  10.Lorazepam 0.5 three daily p.r.n.  11.Klor-Con 10 mEq b.i.d.   PHYSICAL EXAMINATION:  GENERAL:  This is a 77 year old white female with a  flat affect.  VITAL SIGNS:  Blood pressure 173/89, pulse 73.  NECK:  She has a left carotid bruit.  No JVD or hepatojugular  reflux.  LUNGS:  Decreased breath sounds but clear anterior, posterior and lateral.  HEART:  Regular rate and rhythm at 70 beats per minute.  Normal S1 and S2.  Positive S4 with a grade 1/6 systolic murmur at the left sternal border.  ABDOMEN:  Soft without organomegaly, masses, lesions or abnormal tenderness.  EXTREMITIES:  Without cyanosis, clubbing or edema.  She has good distal  pulses.   IMPRESSION:  1. Nausea and right upper quadrant pain with known gallstones awaiting      final diagnosis from Dr. Coral Spikes.  2. Panic attacks.  3. History of coronary artery disease, status post that of hospital      myocardial infarction in 2001, treated with TPA and left anterior      descending stenting followed by elective circumflex stenting.  4. Previous left anterior descending intervention in 1986.  5. Hyperlipidemia.  6. Hypertension.  7. Depression and anxiety.  8. History of  transient ischemic attack.  9. Atypical carcinoid, status post VATS in November 2004 to the right side      with mini-thoracotomy and right lower lobectomy.   At this time the patient's blood pressure is elevated and I have increased  her hydrochlorothiazide to 25 mg a day.  I do not think her current symptoms  are cardiac related and she will follow up with Dr. Coral Spikes concerning her  blood pressure and gallstones.                                  Jacolyn Reedy, PA-C                            Luis Abed, MD, [facility]   ML/MedQ  DD:  09/01/2005  DT:  09/01/2005  Job #:  [id number]   cc:   Demetria Pore. Coral Spikes, MD

## 2010-05-30 NOTE — Discharge Summary (Signed)
[name], [name]                            ACCOUNT NO.:  [id number]   MEDICAL RECORD NO.:  [id number]                   PATIENT TYPE:  INP   LOCATION:  2027                                 FACILITY:  [facility]   PHYSICIAN:  Ines Bloomer, M.D.              DATE OF BIRTH:  Aug 15, 1930   DATE OF ADMISSION:  11/15/2002  DATE OF DISCHARGE:  11/22/2002                                 DISCHARGE SUMMARY   ADMISSION DIAGNOSIS:  Right lower lobe pulmonary nodule.   PAST MEDICAL HISTORY:  1. Coronary artery disease, status post myocardial infarction November 2001     and stent placement.  2. Pneumonia 2002.   PAST SURGICAL HISTORY:  1. Hysterectomy in 1961.  2. Bilateral glaucoma laser repair.   ALLERGIES:  The patient is allergic to PENICILLIN which causes a rash,  CODEINE causes nausea.   DISCHARGE DIAGNOSES:  1. 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  2. New diagnosis of diabetes mellitus type 2.   BRIEF HISTORY:  [name] is a 77 year old Caucasian female. She has a  history of multiple pulmonary nodules which has been followed serially with  CT scans. The right lower lobe nodule has grown from 12 to 15 mm within the  last six months. She was referred to Dr. Edwyna Shell by Dr. Coral Spikes for  consideration of surgical intervention. She was seen in the office by Dr.  Edwyna Shell on October 27. After examination of the patient and review of all the  available records including the most recent CT scan which showed that the  right lower lobe nodule had increased again from 15 mm to 20 mm, Dr. Edwyna Shell  recommended proceeding with surgical intervention for removal of the nodule  and definitive diagnosis. Procedure risks and benefits were all discussed  with [name], and she agreed to proceed with surgery.   HOSPITAL COURSE:  On November 15, 2002, [name] was electively admitted to  [facility] under the care of Dr. Algis Downs.  Karle Plumber. She underwent  the following surgical procedures:  1. Right VATS.  2. Right mini thoracotomy and right lower lobectomy.  3. Lymph node dissection.  4. Placement on On-Q analgesic device.   The tolerated these procedures well and transferred in stable condition to  the PACU. She was extubated immediately following surgery and awoke from  anesthesia neurologically intact. Intraoperative assessments:  Right lower  lobe wedge resection was sent for frozen and was returned with a preliminary  diagnosis of papillary cancer. The remaining right lower lobe was sent for  permanent sections as were the multiple lymph node biopsies.   [name] postoperative course has been essentially uneventful. She is  making steady progress from recovery of her surgery. She has had elevated  glucose readings since admission. Fasting glucoses have been as high  as 153  and 162. Internal medicine was consulted, and she seen by Dr. Katina Degree  partners. She was started on sliding scale insulin initially, but this has  been changed to Lantus insulin. Her CBGs have been much improved with this  regimen. CBG this morning was 118. Her fasting glucose this morning was 117.  [name] has also had some hypertension in the postoperative period. Her  medications have been adjusted. Her Norvasc has been increased to 10 mg a  day and beta blocker has been changed to Toprol-XL 100 mg daily.   Final pathology was returned as atypical carcinoid tumor, 2.0 cm, eroding  into the wall of the bronchus. The bronchial and vascular resection margins  were all negative for tumor. All lymph nodes were negative for tumor.  Oncology consult was requested, and she was seen by Dr. Truett Perna on November 20, 2002. With the diagnosis of a 2-cm atypical carcinoma with negative  surgical margins, Dr. Truett Perna says there is no indication for adjuvant  therapy. He also feels that she has a good prognosis. His recommendation was   to continue CT followup to follow the remaining lung nodules.   November 20, 2002, postoperative day #5, [name] reports feeling fairly  well although she is still having significant incisional pain. Her blood  pressure was 175/75, and she is afebrile. Her room air saturation is 99%.  Her heart is maintaining normal sinus rhythm at 110 beats per minute. Her  lungs are essentially clear. Her incision is healing well. She is tolerating  a good diet. No nausea. Her bowel and bladder functions are within normal  limits for her. She did ambulate yesterday despite having a fair amount of  pain. She does state that the pain medications are helping. As stated above,  her blood glucose levels have improved since she starting the Lantus  insulin. Patient teaching regarding glucose monitoring as well as self-  injection of insulin has been initiated here in the hospital. She will  receive close followup in the postoperative period, and it has been  recommended she followup with Dr. Coral Spikes in approximately one week. If Ms.  Foster continues to progress, it is expected she will be ready for discharge  in the next 24 to 48 hours.   LABORATORY DATA:  November 7 CBC:  White blood cells 9.1, hemoglobin 12.1,  hematocrit 34.5, platelets 273. November 8 chemistries included:  Sodium  136, potassium 3.5, BUN 12, creatinine 0.7, glucose 117.   CONDITION ON DISCHARGE:  Improved.   INSTRUCTIONS ON DISCHARGE:   MEDICATIONS:  She has been instructed to resume the following home  medications:  1. Norvasc 10 mg daily; this is new dose for here.  2. Lipitor 10 mg q.h.s.  3. Aspirin 325 mg q.d.  4. Nitrotab 0.4 p.r.n. for chest pain.  5. Premarin 0.625 mg q.d.  6. Multivitamin daily.  7. Caltrate and vitamin D daily.   New medications:  1. Toprol-XL 100 mg daily.  2. Lantus insulin 6 units subcu q.h.s. She has also been instructed to check    her blood pressure daily and record.  3. Celebrex 200 mg daily  for five days.   Pain management:  She can have Tylox one to two p.o. q.4-6h. p.r.n.   ACTIVITY:  She has been asked to refrain from any driving or heavy lifting.  She is also instructed to continue her breathing exercises and daily  walking.   DIET:  Diet should be a  diabetic diet. She will have some preliminary  instruction regarding this here in the hospital.   WOUND CARE:  She is to shower daily with mild soap and water. If her  incisions show any sign of infection or if she had a fever of 100 degrees  Fahrenheit, she is to call Dr. Scheryl Darter office.   FOLLOW UP:  1. Dr. Edwyna Shell would like to see her at the CV[facility] office on Tuesday November     23 at 3 p.m. She will be asked to have a chest x-ray and CBC at 2 p.m.  2. Dr. Coral Spikes. She has been asked to arrange an appointment in     approximately one week regarding her diabetes mellitus.      Toribio Harbour, N.P.                  Ines Bloomer, M.D.    CTK/MEDQ  D:  11/21/2002  T:  11/21/2002  Job:  [id number]   cc:   Demetria Pore. Coral Spikes, M.D.  [address]  [address]  [place]  [place] [address]  Fax: [phone]

## 2010-05-30 NOTE — Discharge Summary (Signed)
[place]. [facility]  Patient:    [name], [name]                         MRN: [id number] Adm. Date:  [id number] Disc. Date: 12/09/99 Attending:  Lenoria Farrier Dictator:   Joellyn Rued, P.A.C. CC:         Dr. Demetria Pore. Lev[place]  Dr. Jason Fila in [facility]   Referring Physician Discharge [place]  DATE OF BIRTH:  26-Feb-1930  SUMMARY OF HISTORY:  [name] is a 77 year old white female who began having chest discomfort, shortness of breath, and generalized fatigue.  Her daughter took her to Dr. Jason Fila for evaluation (according to patient, daughter works in his office).  She returned to his office a couple of days later with significant reoccurring symptoms, and EKG showed anterior ST segment elevation and she went into cardiac arrest.  She was transferred by rescue squad to [facility] and resuscitated, and given thrombolytics, as well as heparin, aspirin, nitroglycerin, and beta blockers.  She does have a history of hypertension, hyperlipidemia, obesity, prior angioplasty.  Catheterization at [facility] showed a long segmental 50% mid RCA lesion, a 95% proximal LAD, 50% mid LAD, 50% proximal circumflex.  OM-2 had 75% proximal lesion and 95% distal lesion.  She was transferred to our facility for further treatment.  LABORATORY DATA:  At [facility], urinalysis was notable for 2+ ketones, 100 mg/dcl protein, 1+ bacteria.  BUN and creatinine was 21 and 1.1, glucose 169, sodium 135, potassium was low at 2.5.  Initial CK total was 39 with an MB of 0.5.  The second CK rose to 185 with an MB of 17.2.  Triglycerides noted to be 218, HDL 61, VLDL 44, LDL 56.  Subsequent BUN and creatinine were 11 and 0.8, sodium 136, potassium 3.6, glucose 121.  After transfer over to [facility], CKs and troponins were declining.  On November 24, sodium was 133, potassium 3.3, BUN 9, creatinine 0.7.  Subsequent H&H was 10.3 and 29.1, normal indices, platelets  239, WBC 8.4.  On November 27, the morning of discharge, sodium was 135, potassium 3.7, BUN 10, creatinine 0.7, glucose 108.  H&H was 10.5 and 29.5, normal indices, platelets 296, WBC 7.0.  EKGs at [facility] showed normal sinus rhythm, anterior ST segment elevation, inferolateral ST segment depression.  Subsequent EKGs showed evolving anterior MI pattern.  By November 26, her EKG was essentially back to normal, including her ST segments and T waves anteriorly.  HOSPITAL COURSE:  [name] was taken to the catheterization laboratory on November 23, where Dr. Juanda Chance performed angioplasty stenting to the LAD using IVUS.  Reduced the LAD from 95% to less than 10%.  Post procedure, she did well, no further chest discomfort.  With her residual circumflex disease, films were reviewed and felt that if she continued to do well, intervention should be performed on them.  BUN and creatinine remained within normal limits.  Her hypokalemia was corrected.  On November 26, Dr. Juanda Chance angioplastied her distal circumflex, reducing a 90% lesion to less than 20%, and placed a stent in her mid circumflex, reducing this from 80% to 0%.  The last procedure was performed via the left femoral artery.  The first procedure was performed by the right femoral artery.  Post sheath removal and bedrest, she was ambulating without difficulty, and received cardiac teaching from cardiac rehab.  Prior to discharge, it was noted that her  right groin does have a moderate amount of evolving ecchymosis without signs of hematoma or bruit, with intact distal pulses.  It was felt that she could be discharged home.  DISCHARGE DIAGNOSES: 1. Acute anterior myocardial infarction, cardiac arrest. 2. Hypokalemia. 3. Hyperglycemia, probably secondary to myocardial infarction. 4. Status post angioplasty stenting to the left anterior descending artery,    angioplasty to the right coronary artery, and angioplasty stenting to     the right coronary artery.  DISPOSITION:  She is discharged home.  DISCHARGE MEDICATIONS: 1. Plavix 75 mg q.d. for four weeks. 2. Iron 325 mg q.d. for four weeks. 3. Sublingual nitroglycerin as needed.  OTHER MEDICATIONS:  She was asked to continue her home medications, which include: 1. Aspirin 325 q.d. 2. Norvasc 5 mg q.d. 3. Lopressor 50 mg b.i.d. 4. Premarin 1.25 mg q.d. 5. Pravachol 20 q.h.s. 6. Nortriptyline at bedtime.  ACTIVITY:  She was advised no lifting, driving, sexual activity, or heavy exertion until seen by the physician.  DIET:  Maintain low salt/fat/cholesterol diet.  WOUND CARE:  If she had any problems with her catheterization site, she was asked to call immediately.  FOLLOW-UP:  She was asked to bring all her medications to the office.  She will see Dr. Riley Kill on Thursday, December 13 at 3:45 p.m.  She was asked to arrange a follow-up appointment with Dr. Coral Spikes, her primary care physician, in four to six weeks. DD:  12/09/99 TD:  12/09/99 Job: [id number]

## 2010-05-30 NOTE — Letter (Signed)
May 18, 2006    Lennox Pippins, MD  [address]  [place], [place]  [address]   RE:  [name], [name]  MRN:  [id number]  /  DOB:  [birth year]    Dear Theron Arista,   I had the pleasure of seeing [name] in the office today in a  followup visit. To bring you up to date, [name] had some chest  discomfort. We did a radionuclide imaging study. Unfortunately this  study did suggest mild ischemia in the inferolateral wall. As a result,  and given the fact that she had previous ventricular fibrillation, I had  a particularly low threshold for recommending repeat cardiac  catheterization. She also had carotid Doppler done which revealed stable  carotid disease with 0% to 39% bilaterally ICA stenosis. She came to the  hospital and underwent cardiac catheterization. Of note, her potassium  was 3.1 and had to be supplemented prior to the catheterization study.  As she notes, she is chronically with low potassium every time she  enters the hospital. She did undergo cardiac catheterization. This  demonstrated well preserved left ventricular function. The LAD course to  the apex and the proximally placed pixel stent did not demonstrate  significant restenosis. There was perhaps 40% narrowing in the mid  vessel. The circumflex was also fairly heavily calcified and the PTCA  site distally was widely patent. She had about 30% to 40% narrowing in  the mid vessel of the right coronary but it did not appear to be  critical. She was discharged following the catheterization. Another  finding included a chest x-ray which revealed right hilar mediastinal  surgical clips and post surgical changes at the mediastinum. There was  slight shift of the mediastinum to the right as a postoperative change.  The heart size was otherwise normal.   Since discharge she has done reasonably well. She has not been having  any major trouble.   On her examination today the blood pressure was 150/80, the pulse  was  71.  Cardiac rhythm was regular.  The groin was well-healed.   Her electrocardiogram was unremarkable today.   I have taken the liberty of switching her over to K-Dur 20 meq daily.  This will be in lieu of her previous potassium which is actually the  same dose, but she noted that the capsules are coated. We will recheck  her potassium and her magnesium today to make sure that both are not  suppressed. She may required a higher dose. We will call her with those  results and I will see her back in follow up in about 6 weeks and  communicate with you at that time. I appreciate the opportunity of  sharing in her care.    Sincerely,      Arturo Morton. Riley Kill, MD, [facility]  Electronically Signed    TDS/MedQ  DD: 05/18/2006  DT: 05/18/2006  Job #: [id number]

## 2010-05-30 NOTE — Cardiovascular Report (Signed)
NAMEINETTA, DICKE                  ACCOUNT NO.:  [id number]   MEDICAL RECORD NO.:  [id number]          PATIENT TYPE:  OIB   LOCATION:  NA                           FACILITY:  [facility]   PHYSICIAN:  Arturo Morton. Riley Kill, MD, FACCDATE OF BIRTH:  Apr 01, 1930   DATE OF PROCEDURE:  05/12/2006  DATE OF DISCHARGE:                            CARDIAC CATHETERIZATION   INDICATIONS:  Ms. Francesca Jewett is a 77 year old female well-known to me.  She  previously underwent bifurcational balloon angioplasty of the LAD in  1986.  She subsequently had stenting of both the LAD and circumflex by  Dr. Juanda Chance in 2001.  The current study was done after she presented with  an episode of chest pain.  Symptoms have resolved but she had myocardial  perfusion imaging suggesting the possibility of inferior ischemia.  Moreover, the patient previously had ventricular fibrillation after  presenting to her doctor's office with chest pain previously.  I had  attempted to get her to come into the hospital last week, but she wanted  to defer with noninvasive testing first.  I saw her yesterday and we  elected to recommend a cardiac catheterization to better assess her  coronary anatomy.   Risks, benefits and alternatives were discussed with the patient in  detail, and she consented to proceed.   DESCRIPTION OF PROCEDURE:  The patient was brought to the  catheterization laboratory and prepped and draped in the usual fashion.  Through an anterior puncture, the right femoral artery was easily  entered and a 5-French sheath was placed.  Following this, views of the  left and right coronaries were obtained in multiple angiographic  projections.  Central aortic and left ventricular pressures were  measured with a pigtail.  Ventriculography was then performed in the RAO  projection without complication.  All catheters were subsequently  removed and she was taken to the holding area for direct manual  hemostasis.  I spoke with her son in  detail about her findings.  She  will be dismissed later this evening.   HEMODYNAMIC DATA:  1. Central aortic pressure 169/72, mean 116.  2. Left atrial pressure 171/10.  3. No gradient on pullback across the aortic valve.   ANGIOGRAPHIC DATA:  1. Ventriculography is done in the RAO projection.  Overall systolic      function is preserved.  No segmental abnormalities of contraction      were identified.  2. The left main coronary artery is free of critical disease but does      demonstrate a fair amount of calcification.  3. The LAD courses to the apex.  There is a proximally-placed Pixel      stent, which does not demonstrate significant restenosis.  There is      perhaps at most 30% narrowing.  The remainder of the LAD is a      calcified vessel that is diffusely diseased.  There are probably      areas of 40% narrowing in the midvessel.  The distal vessel wraps      the apex.  4. The circumflex is  also a calcified vessel.  There is diffuse      proximal calcification.  In the midportion of the vessel is a      stent.  She has also had PTCA distally and this vessel was widely      patent.  There are diffuse luminal irregularity changes in the      whole distal marginal system but no critical areas of stenosis.  5. The right coronary artery is a dominant vessel providing a      posterior descending and three posterolateral branches.      Importantly, the previous study had demonstrated a 50% area of      diffuse narrowing in the midvessel.  The midvessel is hypodense but      we did do an LAO left lateral shot to better identify this area.      The midvessel is calcified and there is at least 30-40% plaquing in      the midvessel, but it does not appear to be critical.  More      proximally there is also diffuse luminal irregularity and calcified      vessel.  The distal vessel was without critical narrowing.  6. Ventriculography in the RAO projection reveals vigorous global       systolic function without segmental wall motion abnormality.   CONCLUSION:  1. Preserved left ventricular function.  2. Diffuse coronary abnormalities without critical narrowing and no      evidence of significant stent restenosis at the present time.   DISPOSITION:  Risk factor reduction will be recommended and the patient  will be treated medically.  I will see her back in the office in 1-2  weeks in follow-up.      Arturo Morton. Riley Kill, MD, [facility]  Electronically Signed     TDS/MEDQ  D:  05/12/2006  T:  05/12/2006  Job:  [id number]   cc:   Satira Anis  CV Laboratory

## 2010-05-30 NOTE — H&P (Signed)
NAME:  Audrey, Foster NO.:  [id number]   MEDICAL RECORD NO.:  [id number]            PATIENT TYPE:   LOCATION:                                 FACILITY:   PHYSICIAN:  Arturo Morton. Riley Kill, MD, [facility] DATE OF BIRTH:   DATE OF ADMISSION:  05/11/2004  DATE OF DISCHARGE:                              HISTORY & PHYSICAL   CHIEF COMPLAINT:  Chest pain.   HISTORY OF PRESENT ILLNESS:  Ms. [name] is a very delightful 77 year old  female well-known to me.  Recently she has had some chest discomfort.  It is in the left inframammary region.  This has subsequently resolved  but she has remained fatigued and not feeling quite right.  She  underwent radionuclide imaging demonstrating inferior ischemia and based  on this, it was felt that she should undergo cardiac catheterization.  She still feels tired, although her chest pain has subsided over the  past couple of days.   CURRENT MEDICATIONS:  1. Toprol XL 25 mg daily.  2. Norvasc 10 mg daily.  3. Hydrochlorothiazide 25 mg 1/2 tablet daily.  4. Enteric-coated aspirin 325 mg daily.  5. Multivitamin daily.  6. Caltrate two daily.  7. Lipitor 20 mg q.h.s.  8. Plavix 75 mg daily.  9. Premarin 0.3 mg daily.  10.Lorazepam p.r.n.  11.Klor-Con 10 mg p.o. b.i.d.   Her allergies include PENICILLIN, CODEINE, BELLERGAL, and CELEBREX.   PAST MEDICAL HISTORY:  1. Coronary artery disease.  The patient had angioplasty in 1986,      anterior wall MI in November 2001 with stenting of the LAD and      stenting of the circumflex artery.  2. Hypertension.  3. Hyperlipidemia.  4. Depression and anxiety.  5. History of borderline TSH.  6. Atypical carcinoid with successful surgical resection.  7. History of gallstones.  8. History of herpes simplex viral lesion, left buttocks.  9. Prior TIA with noncritical carotids, on Plavix.  10.History of benign positional vertigo.  11.Cyst in the left shoulder  12.History of borderline impaired  glucose.   SOCIAL HISTORY:  The patient does not smoke.  She is retired from  [employer].   FAMILY HISTORY:  __________.   REVIEW OF SYSTEMS:  She has not had nausea or diarrhea or other types of  cardiac symptoms.  The review of systems is complete and otherwise  negative.   PHYSICAL EXAMINATION:  GENERAL:  On physical she is an alert, oriented  female in no distress.  VITAL SIGNS:  Weight is 184, blood pressure 140/76, pulse 76.  NECK:  There is a soft left carotid bruit, which has been previously  evaluated.  The neck is otherwise supple.  LUNGS:  The lung fields are clear to auscultation and percussion.  CARDIAC:  The PMI is nondisplaced.  There is a normal first and second  heart sound without a murmur, rub or gallop. EXTREMITIES:  No edema.  NEUROLOGIC:  Nonfocal.   IMPRESSION:  1. Known coronary disease with prior percutaneous coronary      intervention in 1986, 2001, with recurrent episode  of chest pain      and abnormal myocardial perfusion study.  2. Fatigue and weakness.  3. Other problems as noted above.   PLAN:  The patient will be brought in for cardiac catheterization.  Risks, benefits, and alternatives have been discussed with the patient.  She consents to proceed.      Arturo Morton. Riley Kill, MD, [facility]  Electronically Signed     TDS/MEDQ  D:  05/11/2006  T:  05/11/2006  Job:  [id number]

## 2010-08-02 ENCOUNTER — Inpatient Hospital Stay: Payer: Medicare Other | Admitting: Surgery

## 2010-09-16 ENCOUNTER — Encounter: Payer: Self-pay | Admitting: Cardiology

## 2011-02-08 ENCOUNTER — Other Ambulatory Visit: Payer: Self-pay | Admitting: Cardiology

## 2011-03-12 ENCOUNTER — Encounter: Payer: Self-pay | Admitting: *Deleted

## 2011-03-25 ENCOUNTER — Other Ambulatory Visit: Payer: Self-pay | Admitting: Cardiology

## 2011-03-25 DIAGNOSIS — I6529 Occlusion and stenosis of unspecified carotid artery: Secondary | ICD-10-CM

## 2011-03-30 ENCOUNTER — Encounter: Payer: Self-pay | Admitting: Cardiology

## 2011-03-30 ENCOUNTER — Ambulatory Visit (INDEPENDENT_AMBULATORY_CARE_PROVIDER_SITE_OTHER): Payer: Medicare Other | Admitting: Cardiology

## 2011-03-30 ENCOUNTER — Encounter (INDEPENDENT_AMBULATORY_CARE_PROVIDER_SITE_OTHER): Payer: Medicare Other

## 2011-03-30 VITALS — BP 142/76 | HR 69 | Ht 66.5 in | Wt 189.8 lb

## 2011-03-30 DIAGNOSIS — I6529 Occlusion and stenosis of unspecified carotid artery: Secondary | ICD-10-CM

## 2011-03-30 DIAGNOSIS — I059 Rheumatic mitral valve disease, unspecified: Secondary | ICD-10-CM

## 2011-03-30 DIAGNOSIS — I2510 Atherosclerotic heart disease of native coronary artery without angina pectoris: Secondary | ICD-10-CM | POA: Diagnosis not present

## 2011-03-30 DIAGNOSIS — R079 Chest pain, unspecified: Secondary | ICD-10-CM

## 2011-03-30 DIAGNOSIS — E7800 Pure hypercholesterolemia, unspecified: Secondary | ICD-10-CM

## 2011-03-30 DIAGNOSIS — R300 Dysuria: Secondary | ICD-10-CM | POA: Insufficient documentation

## 2011-03-30 DIAGNOSIS — R0989 Other specified symptoms and signs involving the circulatory and respiratory systems: Secondary | ICD-10-CM

## 2011-03-30 DIAGNOSIS — N390 Urinary tract infection, site not specified: Secondary | ICD-10-CM | POA: Diagnosis not present

## 2011-03-30 DIAGNOSIS — I340 Nonrheumatic mitral (valve) insufficiency: Secondary | ICD-10-CM

## 2011-03-30 DIAGNOSIS — I10 Essential (primary) hypertension: Secondary | ICD-10-CM

## 2011-03-30 LAB — URINALYSIS, ROUTINE W REFLEX MICROSCOPIC
Ketones, ur: NEGATIVE
Nitrite: NEGATIVE
Specific Gravity, Urine: 1.01 (ref 1.000–1.030)
pH: 6 (ref 5.0–8.0)

## 2011-03-30 MED ORDER — CIPROFLOXACIN HCL 250 MG PO TABS: 250.0000 mg | ORAL_TABLET | Freq: Two times a day (BID) | ORAL | Status: AC

## 2011-03-30 NOTE — Assessment & Plan Note (Signed)
For carotid study today.

## 2011-03-30 NOTE — Assessment & Plan Note (Signed)
Prior PCI.  She has no typical symptoms.  Would continue medical therapy.

## 2011-03-30 NOTE — Assessment & Plan Note (Signed)
Minimal by exam and echo.  Follow.

## 2011-03-30 NOTE — Patient Instructions (Addendum)
Your physician recommends that you have a Urinalysis with [facility].  Your physician wants you to follow-up in: 1 YEAR.  You will receive a reminder letter in the mail two months in advance. If you don't receive a letter, please call our office to schedule the follow-up appointment.  Your physician has recommended you make the following change in your medication: START Cipro 250mg  take one by mouth twice a day for 7 days

## 2011-03-30 NOTE — Assessment & Plan Note (Signed)
New onset.  No fever or flank pain.  Will get urinalysis and culture and start on Cipro 250mg  bid times seven days.  Follow up with Dr. Pete Glatter.

## 2011-03-30 NOTE — Assessment & Plan Note (Signed)
Labs monitored by Dr. Pete Glatter.

## 2011-03-30 NOTE — Progress Notes (Signed)
Addended by: Shawnie Pons D on: 03/30/2011 01:39 PM   Modules accepted: Level of Service

## 2011-03-30 NOTE — Assessment & Plan Note (Signed)
Borderline but not significantly elevated.

## 2011-03-30 NOTE — Assessment & Plan Note (Signed)
What she describes at present is atypical.  She knows to contact us if there are any changes.

## 2011-03-30 NOTE — Progress Notes (Signed)
HPI:  She is doing well overall, but not today.  She complains of burning in the urine.  She denies cheesy discharge and has not had recent antibiotics.  Denies her typical chest pain.  On occasion she will get sudden onset of sharp lateral pain that lasts only a second.  No radiation or diaphoresis.  She was concerned about "leaking" valves that were identified on an echo that Dr. Pete Glatter performed, and did not know what that meant.  Report reviewed with patient----sclerosis of AV, mild AI, MAC, mild MR, normal LVEF.  I reviewed this with her in detail, including mechanisms.    Current Outpatient Prescriptions  Medication Sig Dispense Refill  . acetaminophen (TYLENOL) 500 MG tablet Take 500 mg by mouth every 6 (six) hours as needed.      Marland Kitchen amLODipine (NORVASC) 10 MG tablet Take 10 mg by mouth daily.      Marland Kitchen aspirin 81 MG tablet Take 81 mg by mouth daily.      Marland Kitchen atorvastatin (LIPITOR) 20 MG tablet Take 20 mg by mouth daily.      . calcium carbonate (TITRALAC) 420 MG CHEW Chew 420 mg by mouth 2 (two) times daily.      . clopidogrel (PLAVIX) 75 MG tablet Take 75 mg by mouth daily.      Marland Kitchen estrogens, conjugated, (PREMARIN) 0.3 MG tablet Take 0.3 mg by mouth daily. Take daily for 21 days then do not take for 7 days.      . hydrochlorothiazide (HYDRODIURIL) 25 MG tablet Take 25 mg by mouth daily.      Marland Kitchen KLOR-CON M20 20 MEQ tablet TAKE 1 TABLET TWICE DAILY  60 tablet  6  . LORazepam (ATIVAN) 0.5 MG tablet Take 0.5 mg by mouth every 8 (eight) hours.      Marland Kitchen losartan (COZAAR) 25 MG tablet Take 25 mg by mouth daily.      . metoprolol succinate (TOPROL-XL) 25 MG 24 hr tablet Take 25 mg by mouth daily.      . Multiple Vitamin (MULTIVITAMIN) tablet Take 1 tablet by mouth daily.      . nitroGLYCERIN (NITROSTAT) 0.4 MG SL tablet Place 0.4 mg under the tongue every 5 (five) minutes as needed.        Allergies  Allergen Reactions  . Celecoxib   . Codeine   . Oxycodone Hcl   . Penicillins   . Sertraline  Hcl     Past Medical History  Diagnosis Date  . HTN (hypertension)   . Chest pain   . CAD (coronary artery disease)     Past Surgical History  Procedure Date  . Pci 1986  . Carcinoid     removed from lung, [facility]  . Pic of lad 2001    with non des lad   . Pci of mid cfx     with non des and cutting balloon pci of av circumflex    No family history on file.  History   Social History  . Marital Status: Divorced    Spouse Name: N/A    Number of Children: N/A  . Years of Education: N/A   Occupational History  . retired    Social History Main Topics  . Smoking status: Never Smoker   . Smokeless tobacco: Not on file  . Alcohol Use: Not on file  . Drug Use: Not on file  . Sexually Active: Not on file   Other Topics Concern  . Not on file  Social History Narrative  . No narrative on file    ROS: Please see the HPI.  All other systems reviewed and negative.  PHYSICAL EXAM:  BP 142/76  Pulse 69  Ht 5' 6.5" (1.689 m)  Wt 189 lb 12.8 oz (86.093 kg)  BMI 30.18 kg/m2  General: Well developed, well nourished, in no acute distress. Head:  Normocephalic and atraumatic. Neck: no JVD.  Prominent L carotid bruit as before.   Lungs: Clear to auscultation and percussion. Heart: Normal S1 and S2.  PMI non displaced.  Minimal SEM.  1/6 apical murmur.    Abdomen:  Normal bowel sounds; soft; non tender; no organomegaly Pulses: Pulses normal in all 4 extremities. Extremities: No clubbing or cyanosis. No edema. Neurologic: Alert and oriented x 3.  EKG: NSR.  Biatrial enlargement.  ASSESSMENT AND PLAN:

## 2011-04-07 ENCOUNTER — Other Ambulatory Visit: Payer: Self-pay | Admitting: *Deleted

## 2011-04-07 MED ORDER — POTASSIUM CHLORIDE CRYS ER 20 MEQ PO TBCR: 20.0000 meq | EXTENDED_RELEASE_TABLET | Freq: Every day | ORAL | Status: DC

## 2011-04-15 DIAGNOSIS — N390 Urinary tract infection, site not specified: Secondary | ICD-10-CM | POA: Diagnosis not present

## 2011-04-29 DIAGNOSIS — H906 Mixed conductive and sensorineural hearing loss, bilateral: Secondary | ICD-10-CM | POA: Diagnosis not present

## 2011-06-26 DIAGNOSIS — E782 Mixed hyperlipidemia: Secondary | ICD-10-CM | POA: Diagnosis not present

## 2011-06-26 DIAGNOSIS — R5381 Other malaise: Secondary | ICD-10-CM | POA: Diagnosis not present

## 2011-06-26 DIAGNOSIS — R5383 Other fatigue: Secondary | ICD-10-CM | POA: Diagnosis not present

## 2011-06-26 DIAGNOSIS — Z79899 Other long term (current) drug therapy: Secondary | ICD-10-CM | POA: Diagnosis not present

## 2011-06-26 DIAGNOSIS — R05 Cough: Secondary | ICD-10-CM | POA: Diagnosis not present

## 2011-06-26 DIAGNOSIS — I10 Essential (primary) hypertension: Secondary | ICD-10-CM | POA: Diagnosis not present

## 2011-07-22 DIAGNOSIS — H25049 Posterior subcapsular polar age-related cataract, unspecified eye: Secondary | ICD-10-CM | POA: Diagnosis not present

## 2011-10-25 ENCOUNTER — Other Ambulatory Visit: Payer: Self-pay | Admitting: Cardiology

## 2011-10-26 DIAGNOSIS — Z23 Encounter for immunization: Secondary | ICD-10-CM | POA: Diagnosis not present

## 2011-10-26 DIAGNOSIS — R109 Unspecified abdominal pain: Secondary | ICD-10-CM | POA: Diagnosis not present

## 2011-10-27 ENCOUNTER — Other Ambulatory Visit: Payer: Self-pay | Admitting: Internal Medicine

## 2011-10-27 DIAGNOSIS — R1032 Left lower quadrant pain: Secondary | ICD-10-CM

## 2011-10-29 ENCOUNTER — Ambulatory Visit
Admission: RE | Admit: 2011-10-29 | Discharge: 2011-10-29 | Disposition: A | Discharge status: Home / Self Care | Payer: Medicare Other | Source: Ambulatory Visit | Attending: Internal Medicine | Admitting: Internal Medicine

## 2011-10-29 DIAGNOSIS — Z9089 Acquired absence of other organs: Secondary | ICD-10-CM | POA: Diagnosis not present

## 2011-10-29 DIAGNOSIS — R1032 Left lower quadrant pain: Secondary | ICD-10-CM

## 2011-10-29 MED ORDER — IOHEXOL 300 MG/ML  SOLN
100.0000 mL | Freq: Once | INTRAMUSCULAR | Status: AC | PRN
  Administered 2011-10-29: 100 mL via INTRAVENOUS

## 2011-12-09 ENCOUNTER — Other Ambulatory Visit: Payer: Self-pay | Admitting: Cardiology

## 2011-12-09 MED ORDER — POTASSIUM CHLORIDE CRYS ER 20 MEQ PO TBCR: 20.0000 meq | EXTENDED_RELEASE_TABLET | Freq: Two times a day (BID) | ORAL | Status: DC

## 2012-01-08 DIAGNOSIS — R071 Chest pain on breathing: Secondary | ICD-10-CM | POA: Diagnosis not present

## 2012-01-08 DIAGNOSIS — Z1331 Encounter for screening for depression: Secondary | ICD-10-CM | POA: Diagnosis not present

## 2012-01-08 DIAGNOSIS — Z0000 Encounter for general adult medical examination without abnormal findings: Secondary | ICD-10-CM | POA: Diagnosis not present

## 2012-02-03 DIAGNOSIS — H2510 Age-related nuclear cataract, unspecified eye: Secondary | ICD-10-CM | POA: Diagnosis not present

## 2012-02-11 DIAGNOSIS — H2510 Age-related nuclear cataract, unspecified eye: Secondary | ICD-10-CM | POA: Diagnosis not present

## 2012-02-23 ENCOUNTER — Ambulatory Visit: Payer: Self-pay | Admitting: Ophthalmology

## 2012-02-23 DIAGNOSIS — H2510 Age-related nuclear cataract, unspecified eye: Secondary | ICD-10-CM | POA: Diagnosis not present

## 2012-02-23 DIAGNOSIS — Z01812 Encounter for preprocedural laboratory examination: Secondary | ICD-10-CM | POA: Diagnosis not present

## 2012-02-23 DIAGNOSIS — Z01810 Encounter for preprocedural cardiovascular examination: Secondary | ICD-10-CM | POA: Diagnosis not present

## 2012-02-23 DIAGNOSIS — I10 Essential (primary) hypertension: Secondary | ICD-10-CM | POA: Diagnosis not present

## 2012-02-23 LAB — POTASSIUM: Potassium: 3.4 mmol/L — ABNORMAL LOW (ref 3.5–5.1)

## 2012-02-26 ENCOUNTER — Ambulatory Visit: Payer: Medicare Other | Admitting: Cardiology

## 2012-03-01 ENCOUNTER — Ambulatory Visit (INDEPENDENT_AMBULATORY_CARE_PROVIDER_SITE_OTHER): Payer: Medicare Other | Admitting: Cardiology

## 2012-03-01 ENCOUNTER — Encounter: Payer: Self-pay | Admitting: Cardiology

## 2012-03-01 VITALS — BP 152/84 | HR 67 | Ht 66.5 in | Wt 190.0 lb

## 2012-03-01 DIAGNOSIS — R29898 Other symptoms and signs involving the musculoskeletal system: Secondary | ICD-10-CM | POA: Insufficient documentation

## 2012-03-01 DIAGNOSIS — I2510 Atherosclerotic heart disease of native coronary artery without angina pectoris: Secondary | ICD-10-CM | POA: Diagnosis not present

## 2012-03-01 DIAGNOSIS — I6529 Occlusion and stenosis of unspecified carotid artery: Secondary | ICD-10-CM | POA: Diagnosis not present

## 2012-03-01 DIAGNOSIS — I059 Rheumatic mitral valve disease, unspecified: Secondary | ICD-10-CM

## 2012-03-01 DIAGNOSIS — I340 Nonrheumatic mitral (valve) insufficiency: Secondary | ICD-10-CM

## 2012-03-01 DIAGNOSIS — E7800 Pure hypercholesterolemia, unspecified: Secondary | ICD-10-CM

## 2012-03-01 DIAGNOSIS — I6522 Occlusion and stenosis of left carotid artery: Secondary | ICD-10-CM | POA: Insufficient documentation

## 2012-03-01 NOTE — Progress Notes (Signed)
HPI:  Patient returns in followup. From a cardiac standpoint she is stable she does get hot and cold from time to time, particularly at night. She also has a fair amount leg fatigue and wonders whether this is cardiac related. Her echocardiogram in 2012 demonstrated well-preserved left ventricular function, and trace AI.  [name] She denies any chest discomfort at the present time.  Patient had 3 vessel PTCA at [facility] in 1986 and then had a stent placed several years ago  (non DES).  Last cath is listed in this note.  Her main issue is leg fatigue.  She is able to walk without too much difficulty, but can't go very far because of leg fatigue.  No leg pain.    Current Outpatient Prescriptions  Medication Sig Dispense Refill  . acetaminophen (TYLENOL) 500 MG tablet Take 500 mg by mouth every 6 (six) hours as needed.      [name] amLODipine (NORVASC) 10 MG tablet Take 10 mg by mouth daily.      [name] aspirin 81 MG tablet Take 81 mg by mouth daily.      [name] atorvastatin (LIPITOR) 20 MG tablet Take 20 mg by mouth daily.      . calcium carbonate (TITRALAC) 420 MG CHEW Chew 420 mg by mouth 2 (two) times daily.      . clopidogrel (PLAVIX) 75 MG tablet Take 75 mg by mouth daily.      [name] estrogens, conjugated, (PREMARIN) 0.3 MG tablet Take 0.3 mg by mouth daily. Take daily for 21 days then do not take for 7 days.      . hydrochlorothiazide (HYDRODIURIL) 25 MG tablet Take 25 mg by mouth daily.      [name] KLOR-CON M20 20 MEQ tablet TAKE 1 TABLET TWICE DAILY  60 tablet  6  . LORazepam (ATIVAN) 0.5 MG tablet Take 0.5 mg by mouth every 8 (eight) hours.      [name] losartan (COZAAR) 25 MG tablet Take 25 mg by mouth daily.      . metoprolol succinate (TOPROL-XL) 25 MG 24 hr tablet Take 25 mg by mouth daily.      . Multiple Vitamin (MULTIVITAMIN) tablet Take 1 tablet by mouth daily.      . nitroGLYCERIN (NITROSTAT) 0.4 MG SL tablet Place 0.4 mg under the tongue every 5 (five) minutes as needed.      . potassium chloride SA (KLOR-CON M20) 20  MEQ tablet Take 1 tablet (20 mEq total) by mouth 2 (two) times daily.  180 tablet  3   No current facility-administered medications for this visit.    Allergies  Allergen Reactions  . Celecoxib   . Codeine   . Oxycodone Hcl   . Penicillins   . Sertraline Hcl     Past Medical History  Diagnosis Date  . HTN (hypertension)   . Chest pain   . CAD (coronary artery disease)     Past Surgical History  Procedure Laterality Date  . Pci  1986  . Carcinoid      removed from lung, [facility]  . Pic of lad  2001    with non des lad   . Pci of mid cfx      with non des and cutting balloon pci of av circumflex    No family history on file.  History   Social History  . Marital Status: Divorced    Spouse Name: N/A    Number of Children: N/A  . Years of Education: N/A  Occupational History  . retired    Social History Main Topics  . Smoking status: Never Smoker   . Smokeless tobacco: Not on file  . Alcohol Use: Not on file  . Drug Use: Not on file  . Sexually Active: Not on file   Other Topics Concern  . Not on file   Social History Narrative  . No narrative on file    ROS: Please see the HPI.  All other systems reviewed and negative.  PHYSICAL EXAM:  BP 152/84  Pulse 67  Ht 5' 6.5" (1.689 m)  Wt 190 lb (86.183 kg)  BMI 30.21 kg/m2  SpO2 99%  General: Well developed, well nourished, in no acute distress. Head:  Normocephalic and atraumatic. Neck: no JVD Lungs: Clear to auscultation and percussion. Heart: Normal S1 and S2.  Minimal SEM.  NO DM.   Abdomen:  Normal bowel sounds; soft; non tender; no organomegaly Pulses: Pulses normal in all 4 extremities.  Pulses are excellent in the feet with good tissue refill.   Extremities: No clubbing or cyanosis. No edema. Neurologic: Alert and oriented x 3.  EKG:  NSR.  WNL.  CATH 2008  ANGIOGRAPHIC DATA:  1. Ventriculography is done in the RAO projection. Overall systolic  function is preserved. No segmental  abnormalities of contraction  were identified.  2. The left main coronary artery is free of critical disease but does  demonstrate a fair amount of calcification.  3. The LAD courses to the apex. There is a proximally-placed Pixel  stent, which does not demonstrate significant restenosis. There is  perhaps at most 30% narrowing. The remainder of the LAD is a  calcified vessel that is diffusely diseased. There are probably  areas of 40% narrowing in the midvessel. The distal vessel wraps  the apex.  4. The circumflex is also a calcified vessel. There is diffuse  proximal calcification. In the midportion of the vessel is a  stent. She has also had PTCA distally and this vessel was widely  patent. There are diffuse luminal irregularity changes in the  whole distal marginal system but no critical areas of stenosis.  5. The right coronary artery is a dominant vessel providing a  posterior descending and three posterolateral branches.  Importantly, the previous study had demonstrated a 50% area of  diffuse narrowing in the midvessel. The midvessel is hypodense but  we did do an LAO left lateral shot to better identify this area.  The midvessel is calcified and there is at least 30-40% plaquing in  the midvessel, but it does not appear to be critical. More  proximally there is also diffuse luminal irregularity and calcified  vessel. The distal vessel was without critical narrowing.  6. Ventriculography in the RAO projection reveals vigorous global  systolic function without segmental wall motion abnormality.  CONCLUSION:  1. Preserved left ventricular function.  2. Diffuse coronary abnormalities without critical narrowing and no  evidence of significant stent restenosis at the present time.  DISPOSITION: Risk factor reduction will be recommended and the patient  will be treated medically. I will see her back in the office in 1-2  weeks in follow-up.  Arturo Morton. Riley Kill, MD, [facility]    Electronically Signed    ASSESSMENT AND PLAN:  She should be stable for cataract surgery.

## 2012-03-01 NOTE — Assessment & Plan Note (Signed)
She is stable.  She is do for a repeat carotid which we will order at this time.  We will arrange follow up with Dr. Mariah Milling.

## 2012-03-01 NOTE — Patient Instructions (Addendum)
Your physician recommends that you have lab work today: CK  Your physician recommends that you continue on your current medications as directed. Please refer to the Current Medication list given to you today.  Your physician recommends that you schedule a follow-up appointment in: 3 MONTHS with Dr Mariah Milling (previous pt of Dr Riley Kill)  Your physician has requested that you have a carotid duplex in [facility] 2014. This test is an ultrasound of the carotid arteries in your neck. It looks at blood flow through these arteries that supply the brain with blood. Allow one hour for this exam. There are no restrictions or special instructions.

## 2012-03-01 NOTE — Assessment & Plan Note (Signed)
The patient has moderate leg fatigue. She does not have significant obstruction in that she has excellent pulses in the feet. We will arrange CPK. We discussed the possibility of a Lipitor holiday, but importantly the patient does have advanced coronary artery disease. I will have her see Dr. Mariah Milling in about 3 months, and at that time they can make further decisions. She is in the arm of having her cataract done.

## 2012-03-01 NOTE — Assessment & Plan Note (Signed)
Patient has not had current symptoms. She has been stable. She is somewhat limited in her exercise tolerance, but when she does go she gets no angina whatsoever. Notably, she is scheduled for a low-risk procedure and a cataract and I don't think there are any limitations for her proceeding at this time. She would not need further evaluation presently

## 2012-03-11 NOTE — Assessment & Plan Note (Signed)
Currently on medical therapy.

## 2012-03-11 NOTE — Assessment & Plan Note (Signed)
See [facility] echo report 2012.  Mild

## 2012-03-17 ENCOUNTER — Ambulatory Visit: Payer: Medicare Other | Admitting: Cardiology

## 2012-03-17 ENCOUNTER — Encounter (INDEPENDENT_AMBULATORY_CARE_PROVIDER_SITE_OTHER): Payer: Medicare Other

## 2012-03-22 ENCOUNTER — Ambulatory Visit: Payer: Self-pay | Admitting: Ophthalmology

## 2012-03-22 DIAGNOSIS — H269 Unspecified cataract: Secondary | ICD-10-CM | POA: Diagnosis not present

## 2012-03-22 DIAGNOSIS — I10 Essential (primary) hypertension: Secondary | ICD-10-CM | POA: Diagnosis not present

## 2012-03-22 DIAGNOSIS — Z7902 Long term (current) use of antithrombotics/antiplatelets: Secondary | ICD-10-CM | POA: Diagnosis not present

## 2012-03-22 DIAGNOSIS — Z79899 Other long term (current) drug therapy: Secondary | ICD-10-CM | POA: Diagnosis not present

## 2012-03-22 DIAGNOSIS — I2510 Atherosclerotic heart disease of native coronary artery without angina pectoris: Secondary | ICD-10-CM | POA: Diagnosis not present

## 2012-03-22 DIAGNOSIS — Z8673 Personal history of transient ischemic attack (TIA), and cerebral infarction without residual deficits: Secondary | ICD-10-CM | POA: Diagnosis not present

## 2012-03-22 DIAGNOSIS — Z880 Allergy status to penicillin: Secondary | ICD-10-CM | POA: Diagnosis not present

## 2012-03-22 DIAGNOSIS — Z85118 Personal history of other malignant neoplasm of bronchus and lung: Secondary | ICD-10-CM | POA: Diagnosis not present

## 2012-03-29 ENCOUNTER — Ambulatory Visit: Payer: Medicare Other | Admitting: Cardiovascular Disease

## 2012-04-01 DIAGNOSIS — Z79899 Other long term (current) drug therapy: Secondary | ICD-10-CM | POA: Diagnosis not present

## 2012-04-25 ENCOUNTER — Observation Stay (HOSPITAL_COMMUNITY)
Admission: EM | Admit: 2012-04-25 | Discharge: 2012-04-28 | Disposition: A | Discharge status: Home / Self Care | Payer: Medicare Other | Attending: Cardiology | Admitting: Cardiology

## 2012-04-25 ENCOUNTER — Encounter (HOSPITAL_COMMUNITY): Payer: Self-pay | Admitting: *Deleted

## 2012-04-25 ENCOUNTER — Emergency Department (HOSPITAL_COMMUNITY): Payer: Medicare Other

## 2012-04-25 DIAGNOSIS — R609 Edema, unspecified: Secondary | ICD-10-CM | POA: Diagnosis not present

## 2012-04-25 DIAGNOSIS — I10 Essential (primary) hypertension: Secondary | ICD-10-CM | POA: Diagnosis not present

## 2012-04-25 DIAGNOSIS — R079 Chest pain, unspecified: Secondary | ICD-10-CM | POA: Diagnosis not present

## 2012-04-25 DIAGNOSIS — R0602 Shortness of breath: Secondary | ICD-10-CM

## 2012-04-25 DIAGNOSIS — R9431 Abnormal electrocardiogram [ECG] [EKG]: Secondary | ICD-10-CM | POA: Diagnosis not present

## 2012-04-25 DIAGNOSIS — J90 Pleural effusion, not elsewhere classified: Secondary | ICD-10-CM | POA: Diagnosis not present

## 2012-04-25 DIAGNOSIS — I4949 Other premature depolarization: Secondary | ICD-10-CM

## 2012-04-25 DIAGNOSIS — I2510 Atherosclerotic heart disease of native coronary artery without angina pectoris: Principal | ICD-10-CM

## 2012-04-25 DIAGNOSIS — I498 Other specified cardiac arrhythmias: Secondary | ICD-10-CM | POA: Diagnosis not present

## 2012-04-25 DIAGNOSIS — E785 Hyperlipidemia, unspecified: Secondary | ICD-10-CM | POA: Insufficient documentation

## 2012-04-25 DIAGNOSIS — R0989 Other specified symptoms and signs involving the circulatory and respiratory systems: Secondary | ICD-10-CM | POA: Diagnosis not present

## 2012-04-25 DIAGNOSIS — N6310 Unspecified lump in the right breast, unspecified quadrant: Secondary | ICD-10-CM

## 2012-04-25 DIAGNOSIS — R0609 Other forms of dyspnea: Secondary | ICD-10-CM | POA: Diagnosis not present

## 2012-04-25 DIAGNOSIS — I493 Ventricular premature depolarization: Secondary | ICD-10-CM

## 2012-04-25 DIAGNOSIS — E7800 Pure hypercholesterolemia, unspecified: Secondary | ICD-10-CM

## 2012-04-25 DIAGNOSIS — I2089 Other forms of angina pectoris: Secondary | ICD-10-CM

## 2012-04-25 DIAGNOSIS — R222 Localized swelling, mass and lump, trunk: Secondary | ICD-10-CM | POA: Insufficient documentation

## 2012-04-25 DIAGNOSIS — R10819 Abdominal tenderness, unspecified site: Secondary | ICD-10-CM | POA: Diagnosis not present

## 2012-04-25 DIAGNOSIS — I469 Cardiac arrest, cause unspecified: Secondary | ICD-10-CM | POA: Insufficient documentation

## 2012-04-25 DIAGNOSIS — I208 Other forms of angina pectoris: Secondary | ICD-10-CM | POA: Diagnosis present

## 2012-04-25 HISTORY — DX: Disorder of arteries and arterioles, unspecified: I77.9

## 2012-04-25 HISTORY — DX: Hyperlipidemia, unspecified: E78.5

## 2012-04-25 HISTORY — DX: Peripheral vascular disease, unspecified: I73.9

## 2012-04-25 LAB — BASIC METABOLIC PANEL
CO2: 28 mEq/L (ref 19–32)
Chloride: 104 mEq/L (ref 96–112)
Glucose, Bld: 122 mg/dL — ABNORMAL HIGH (ref 70–99)
Sodium: 141 mEq/L (ref 135–145)

## 2012-04-25 LAB — PROTIME-INR
INR: 1.02 (ref 0.00–1.49)
Prothrombin Time: 13.3 seconds (ref 11.6–15.2)

## 2012-04-25 LAB — CBC
HCT: 39.7 % (ref 36.0–46.0)
Hemoglobin: 13.1 g/dL (ref 12.0–15.0)
Hemoglobin: 13.9 g/dL (ref 12.0–15.0)
MCHC: 35 g/dL (ref 30.0–36.0)
MCV: 87.5 fL (ref 78.0–100.0)
Platelets: 189 10*3/uL (ref 150–400)
RBC: 4.31 MIL/uL (ref 3.87–5.11)
RDW: 12.5 % (ref 11.5–15.5)
WBC: 6.5 10*3/uL (ref 4.0–10.5)
WBC: 7.1 10*3/uL (ref 4.0–10.5)

## 2012-04-25 LAB — POCT I-STAT TROPONIN I

## 2012-04-25 LAB — MAGNESIUM: Magnesium: 1.8 mg/dL (ref 1.5–2.5)

## 2012-04-25 LAB — CREATININE, SERUM
Creatinine, Ser: 0.74 mg/dL (ref 0.50–1.10)
GFR calc Af Amer: >90 mL/min (ref >90)
GFR calc non Af Amer: 78 mL/min — ABNORMAL LOW (ref >90)

## 2012-04-25 MED ORDER — SODIUM CHLORIDE 0.9 % IV SOLN: 250.0000 mL | INTRAVENOUS | Status: DC | PRN

## 2012-04-25 MED ORDER — ADULT MULTIVITAMIN W/MINERALS CH
1.0000 | ORAL_TABLET | Freq: Every day | ORAL | Status: DC
  Administered 2012-04-25 – 2012-04-28 (×4): 1 via ORAL
  Filled 2012-04-25 (×4): qty 1

## 2012-04-25 MED ORDER — LOSARTAN POTASSIUM 50 MG PO TABS
50.0000 mg | ORAL_TABLET | Freq: Every day | ORAL | Status: DC
  Administered 2012-04-25 – 2012-04-28 (×4): 50 mg via ORAL
  Filled 2012-04-25 (×5): qty 1

## 2012-04-25 MED ORDER — HYDROCHLOROTHIAZIDE 25 MG PO TABS
25.0000 mg | ORAL_TABLET | Freq: Every day | ORAL | Status: DC
  Administered 2012-04-25 – 2012-04-28 (×4): 25 mg via ORAL
  Filled 2012-04-25 (×4): qty 1

## 2012-04-25 MED ORDER — ASPIRIN 81 MG PO CHEW
81.0000 mg | CHEWABLE_TABLET | Freq: Every day | ORAL | Status: DC
  Administered 2012-04-25 – 2012-04-28 (×4): 81 mg via ORAL
  Filled 2012-04-25 (×4): qty 1

## 2012-04-25 MED ORDER — METOPROLOL SUCCINATE ER 100 MG PO TB24
100.0000 mg | ORAL_TABLET | Freq: Every day | ORAL | Status: DC
  Administered 2012-04-26 – 2012-04-28 (×3): 100 mg via ORAL
  Filled 2012-04-25 (×3): qty 1

## 2012-04-25 MED ORDER — NITROGLYCERIN 0.4 MG SL SUBL: 0.4000 mg | SUBLINGUAL_TABLET | SUBLINGUAL | Status: DC | PRN

## 2012-04-25 MED ORDER — ESTROGENS CONJUGATED 0.3 MG PO TABS
0.3000 mg | ORAL_TABLET | Freq: Every day | ORAL | Status: DC
  Administered 2012-04-25 – 2012-04-28 (×4): 0.3 mg via ORAL
  Filled 2012-04-25 (×4): qty 1

## 2012-04-25 MED ORDER — PANTOPRAZOLE SODIUM 40 MG PO TBEC
40.0000 mg | DELAYED_RELEASE_TABLET | Freq: Every day | ORAL | Status: DC
  Administered 2012-04-26 – 2012-04-28 (×3): 40 mg via ORAL
  Filled 2012-04-25 (×3): qty 1

## 2012-04-25 MED ORDER — ATORVASTATIN CALCIUM 20 MG PO TABS
20.0000 mg | ORAL_TABLET | Freq: Every day | ORAL | Status: DC
  Administered 2012-04-25 – 2012-04-28 (×4): 20 mg via ORAL
  Filled 2012-04-25 (×4): qty 1

## 2012-04-25 MED ORDER — SODIUM CHLORIDE 0.9 % IJ SOLN: 3.0000 mL | INTRAMUSCULAR | Status: DC | PRN

## 2012-04-25 MED ORDER — CALCIUM CARBONATE-VITAMIN D 500-200 MG-UNIT PO TABS
1.0000 | ORAL_TABLET | Freq: Every day | ORAL | Status: DC
  Administered 2012-04-25 – 2012-04-28 (×4): 1 via ORAL
  Filled 2012-04-25 (×4): qty 1

## 2012-04-25 MED ORDER — AMLODIPINE BESYLATE 10 MG PO TABS
10.0000 mg | ORAL_TABLET | Freq: Every day | ORAL | Status: DC
  Administered 2012-04-26 – 2012-04-28 (×3): 10 mg via ORAL
  Filled 2012-04-25 (×4): qty 1

## 2012-04-25 MED ORDER — LORAZEPAM 0.5 MG PO TABS
0.5000 mg | ORAL_TABLET | Freq: Three times a day (TID) | ORAL | Status: DC | PRN
  Administered 2012-04-26 – 2012-04-27 (×3): 0.5 mg via ORAL
  Filled 2012-04-25 (×3): qty 1

## 2012-04-25 MED ORDER — HEPARIN SODIUM (PORCINE) 5000 UNIT/ML IJ SOLN
5000.0000 [IU] | Freq: Three times a day (TID) | INTRAMUSCULAR | Status: DC
  Administered 2012-04-25 – 2012-04-28 (×6): 5000 [IU] via SUBCUTANEOUS
  Filled 2012-04-25 (×12): qty 1

## 2012-04-25 MED ORDER — ACETAMINOPHEN 325 MG PO TABS
650.0000 mg | ORAL_TABLET | ORAL | Status: DC | PRN
  Administered 2012-04-27: 650 mg via ORAL
  Filled 2012-04-25: qty 2

## 2012-04-25 MED ORDER — SODIUM CHLORIDE 0.9 % IJ SOLN
3.0000 mL | Freq: Two times a day (BID) | INTRAMUSCULAR | Status: DC
  Administered 2012-04-25 – 2012-04-27 (×3): 3 mL via INTRAVENOUS

## 2012-04-25 MED ORDER — POTASSIUM CHLORIDE CRYS ER 20 MEQ PO TBCR
20.0000 meq | EXTENDED_RELEASE_TABLET | Freq: Two times a day (BID) | ORAL | Status: DC
  Administered 2012-04-25 – 2012-04-28 (×6): 20 meq via ORAL
  Filled 2012-04-25 (×8): qty 1

## 2012-04-25 MED ORDER — REGADENOSON 0.4 MG/5ML IV SOLN
0.4000 mg | Freq: Once | INTRAVENOUS | Status: AC
  Filled 2012-04-25: qty 5

## 2012-04-25 MED ORDER — CLOPIDOGREL BISULFATE 75 MG PO TABS
75.0000 mg | ORAL_TABLET | Freq: Every day | ORAL | Status: DC
  Administered 2012-04-26 – 2012-04-28 (×3): 75 mg via ORAL
  Filled 2012-04-25 (×3): qty 1

## 2012-04-25 MED ORDER — ONDANSETRON HCL 4 MG/2ML IJ SOLN: 4.0000 mg | Freq: Four times a day (QID) | INTRAMUSCULAR | Status: DC | PRN

## 2012-04-25 NOTE — ED Notes (Signed)
PT sent from PCP because of Irregular HR . Office ekg showed BI g . PT denies any any CP or dizziness.  Pt was at PCP for check up on ABD pain because she has a HX of blockages  Colon.

## 2012-04-25 NOTE — ED Provider Notes (Signed)
History     CSN: [id number]  Arrival date & time 04/25/12  1228   First MD Initiated Contact with Patient 04/25/12 1310      Chief Complaint  Patient presents with  . Abdominal Pain  . Irregular Heart Beat    (Consider location/radiation/quality/duration/timing/severity/associated sxs/prior treatment) HPI Comments: [name] is a 79 y.o. female with a history of CAD (followed by [facility] with 2 stent placed), hypertension, and lung carcinoma presents emergency department from her primary care physician's office.  Patient was being evaluated for abdominal bloating that has been ongoing for approximately 3 weeks when her physician incidentally found abnormalities on patient's ECG.  On arrival to the emergency Department patient denies any chest pain but does report that she's had bilateral lower extremity edema x2 days with worsening shortness of breath.  Patient denies any dizziness, syncope, chest pain, trauma, lightheadedness, PND, fevers, night sweats, chills, cough.  Patient is a 79 y.o. female presenting with abdominal pain. The history is provided by the patient.  Abdominal Pain   Past Medical History  Diagnosis Date  . HTN (hypertension)   . Chest pain   . CAD (coronary artery disease)   . Cardiac arrest     Past Surgical History  Procedure Laterality Date  . Pci  1986  . Carcinoid      removed from lung, [facility]  . Pic of lad  2001    with non des lad   . Pci of mid cfx      with non des and cutting balloon pci of av circumflex    No family history on file.  History  Substance Use Topics  . Smoking status: Never Smoker   . Smokeless tobacco: Not on file  . Alcohol Use: No    OB History   Grav Para Term Preterm Abortions TAB SAB Ect Mult Living                  Review of Systems  Gastrointestinal: Positive for abdominal pain.  All other systems reviewed and are negative.    Allergies  Celecoxib; Codeine; Oxycodone hcl; Penicillins; and  Sertraline hcl  Home Medications   Current Outpatient Rx  Name  Route  Sig  Dispense  Refill  . amLODipine (NORVASC) 10 MG tablet   Oral   Take 10 mg by mouth daily.         Marland Kitchen aspirin 81 MG tablet   Oral   Take 81 mg by mouth daily.         Marland Kitchen atorvastatin (LIPITOR) 20 MG tablet   Oral   Take 20 mg by mouth daily.         . Calcium Carbonate-Vitamin D (CALCIUM 600 + D PO)   Oral   Take 1 tablet by mouth daily. 600 mg/ 400 mg         . clopidogrel (PLAVIX) 75 MG tablet   Oral   Take 75 mg by mouth daily.         Marland Kitchen estrogens, conjugated, (PREMARIN) 0.3 MG tablet   Oral   Take 0.3 mg by mouth daily. Take daily for 21 days then do not take for 7 days.         . hydrochlorothiazide (HYDRODIURIL) 25 MG tablet   Oral   Take 25 mg by mouth daily.         Marland Kitchen LORazepam (ATIVAN) 0.5 MG tablet   Oral   Take 0.5 mg by mouth  every 8 (eight) hours as needed for anxiety.          Marland Kitchen losartan (COZAAR) 50 MG tablet   Oral   Take 50 mg by mouth daily.         . metoprolol succinate (TOPROL-XL) 50 MG 24 hr tablet   Oral   Take 50 mg by mouth daily. Take with or immediately following a meal.         . Multiple Vitamin (MULTIVITAMIN) tablet   Oral   Take 1 tablet by mouth daily.         . nitroGLYCERIN (NITROSTAT) 0.4 MG SL tablet   Sublingual   Place 0.4 mg under the tongue every 5 (five) minutes as needed for chest pain.          . potassium chloride SA (K-DUR,KLOR-CON) 20 MEQ tablet   Oral   Take 20 mEq by mouth 2 (two) times daily.           BP 140/39  Pulse 77  Temp(Src) 98.5 F (36.9 C) (Oral)  Resp 20  SpO2 100%  Physical Exam  Constitutional: She is oriented to person, place, and time. She appears well-developed and well-nourished. No distress.  HENT:  Head: Normocephalic and atraumatic.  Mouth/Throat: Oropharynx is clear and moist. No oropharyngeal exudate.  Eyes: Conjunctivae and EOM are normal. Pupils are equal, round, and reactive  to light. No scleral icterus.  Neck: Normal range of motion. Neck supple. No tracheal deviation present. No thyromegaly present.  Cardiovascular: Normal rate, regular rhythm, normal heart sounds and intact distal pulses.   1 + pitting edema bilaterally   Pulmonary/Chest: Effort normal and breath sounds normal. No stridor. No respiratory distress. She has no wheezes.  Abdominal: Soft.  Diffuse mild tenderness to palpation.  No peritoneal signs.  Musculoskeletal: Normal range of motion. She exhibits no edema and no tenderness.  Neurological: She is alert and oriented to person, place, and time. Coordination normal.  Skin: Skin is warm and dry. No rash noted. She is not diaphoretic. No erythema. No pallor.  Psychiatric: She has a normal mood and affect. Her behavior is normal.    ED Course  Procedures (including critical care time)  Labs Reviewed  BASIC METABOLIC PANEL - Abnormal; Notable for the following:    Glucose, Bld 122 (*)    GFR calc non Af Amer 78 (*)    GFR calc Af Amer 90 (*)    All other components within normal limits  CBC  PROTIME-INR  POCT I-STAT TROPONIN I   Dg Chest Port 1 View  04/25/2012  *RADIOLOGY REPORT*  Clinical Data: Irregular heartbeat  PORTABLE CHEST - 1 VIEW  Comparison: 04/13/2007  Findings: The heart is mildly enlarged in size.  A small right- sided pleural effusion is noted.  The lungs are otherwise clear. Bony abnormality is seen.  IMPRESSION: Small right-sided pleural effusion.   Original Report Authenticated By: Alcide Clever, M.D.      Date: 04/25/2012  Rate: 79  Rhythm: premature ventricular contractions (PVC)  QRS Axis: normal  Intervals: normal  ST/T Wave abnormalities: nonspecific ST changes  Conduction Disutrbances:none  Narrative Interpretation: bigeminy   Old EKG Reviewed: unchanged   No diagnosis found.  Consult [facility] BP 152/53  Pulse 81  Temp(Src) 98.5 F (36.9 C) (Oral)  Resp 16  SpO2 96%   MDM  ECG changes,  Edema, SOB/DOE Patient is an 79 year old female with a history of CAD and hypertension that presents to the  emergency department from her primary care provider's office with acute ECG changes, edema and shortness of breath.  Patient is to be evaluated by [facility] while in the emergency department.  I anticipate likely admission due to symptomatic ECG changes, patient's age and comorbidities.  Patient care will be resumed by oncoming provider. Case seen and discussed with Dr. Lorenso Courier who agrees with plan thus far. Disposition pending consult.         Jaci Carrel, [place] 04/25/12 1614

## 2012-04-25 NOTE — H&P (Signed)
Patient ID: LAKEN ROG MRN: [id number], DOB/AGE: 08/03/1930   Admit date: 04/25/2012  Primary Physician: Ginette Otto, MD Primary Cardiologist: Dr. Riley Kill  Pt. Profile:  [name] is a 79 yr old with a medical history of CAD, HTN, and Lung CA who presents today from hr PCP's office with an abnormal ECG consisting of bigeminy PVC's.  Problem List  Past Medical History  Diagnosis Date  . HTN (hypertension)   . CAD (coronary artery disease)     a. 1986: s/p PTCA of bifurcation lesion in LAD;  b. 2001: s/p BMS to LAD & LCX;  c. 04/2006 Cath: diff CAD w/o critical narrowing, nl EF;  d. 06/2010 Echo: EF 60-65%, mild AI/MR.  . Carotid disease, bilateral     a. RICA 0-39%, LICA 40-60%  . Hyperlipidemia     Past Surgical History  Procedure Laterality Date  . Pci  1986  . Carcinoid      removed from lung, [facility]  . Pic of lad  2001    with non des lad   . Pci of mid cfx      with non des and cutting balloon pci of av circumflex     Allergies  Allergies  Allergen Reactions  . Celecoxib   . Codeine   . Oxycodone Hcl   . Penicillins   . Sertraline Hcl     HPI:  [name] is a 79 year old with a past medical history of above problem list who presents from her PCP's office with bigeminy PVC's.  She was last seen by Dr. Riley Kill in his office on 02/20/12 for leg fatigue.  [name] has had a PTCA at [facility] in 1986 and then BMS of the LAD and LCX in 2001.  Her last echo on 07/02/10 showed an EF of 60-65% and mild aortic and mitral regurg.  Last cath in 2008 showed diffuse coronary abnormalities without critical narrowing and no evidence of significant stent restenosis at the present time.  [name] saw her PCP today with complaints of abdominal bloating over 3 weeks.  At that time she also started having some SOB and CP while bending over and some increased SOB without CP while walking for extended periods of time.  She describes the CP as sharp, lower mid sternal that lasts only  a few seconds.  Denies any dizziness, diaphoresis, nausea, syncope, PND, fever, chills, or cough. She does endorse some palpitations while lying down trying to go to sleep without any associated symptoms.    Abdominal discomfort is resolved by her walking around her house and burping.  She is not taking anything for reflux at this time because she is afraid to take any OTC medications unless her Dr says it OK.  She has also been experiencing some BLE swelling that started last Saturday and is now almost completely resolved.    Home Medications  Prior to Admission medications   Medication Sig Start Date End Date Taking? Authorizing Provider  amLODipine (NORVASC) 10 MG tablet Take 10 mg by mouth daily.   Yes Historical Provider, MD  aspirin 81 MG tablet Take 81 mg by mouth daily.   Yes Historical Provider, MD  atorvastatin (LIPITOR) 20 MG tablet Take 20 mg by mouth daily.   Yes Historical Provider, MD  Calcium Carbonate-Vitamin D (CALCIUM 600 + D PO) Take 1 tablet by mouth daily. 600 mg/ 400 mg   Yes Historical Provider, MD  clopidogrel (PLAVIX) 75 MG tablet Take 75 mg  by mouth daily.   Yes Historical Provider, MD  estrogens, conjugated, (PREMARIN) 0.3 MG tablet Take 0.3 mg by mouth daily. Take daily for 21 days then do not take for 7 days.   Yes Historical Provider, MD  hydrochlorothiazide (HYDRODIURIL) 25 MG tablet Take 25 mg by mouth daily.   Yes Historical Provider, MD  LORazepam (ATIVAN) 0.5 MG tablet Take 0.5 mg by mouth every 8 (eight) hours as needed for anxiety.    Yes Historical Provider, MD  losartan (COZAAR) 50 MG tablet Take 50 mg by mouth daily.   Yes Historical Provider, MD  metoprolol succinate (TOPROL-XL) 50 MG 24 hr tablet Take 50 mg by mouth daily. Take with or immediately following a meal.   Yes Historical Provider, MD  Multiple Vitamin (MULTIVITAMIN) tablet Take 1 tablet by mouth daily.   Yes Historical Provider, MD  nitroGLYCERIN (NITROSTAT) 0.4 MG SL tablet Place 0.4 mg under  the tongue every 5 (five) minutes as needed for chest pain.    Yes Historical Provider, MD  potassium chloride SA (K-DUR,KLOR-CON) 20 MEQ tablet Take 20 mEq by mouth 2 (two) times daily.   Yes Historical Provider, MD    Family History  No family history on file.  Social History  History   Social History  . Marital Status: Divorced    Spouse Name: N/A    Number of Children: N/A  . Years of Education: N/A   Occupational History  . retired    Social History Main Topics  . Smoking status: Never Smoker   . Smokeless tobacco: Not on file  . Alcohol Use: No  . Drug Use: No  . Sexually Active: Not on file   Other Topics Concern  . Not on file   Social History Narrative  . No narrative on file     Review of Systems General:  Positive for fatigue. No chills, fever, night sweats or weight changes.  Cardiovascular:  Positive for chest pain while bending over, dyspnea on exertion, edema, Denies orthopnea, palpitations, paroxysmal nocturnal dyspnea. Dermatological: No rash, lesions/masses Respiratory: Positive for dyspnea, No cough,  Urologic: No hematuria, dysuria Abdominal:   Positive for abdominal bloating and excess burping.  No nausea, vomiting, diarrhea, bright red blood per rectum, melena, or hematemesis Neurologic:  No visual changes, wkns, changes in mental status. All other systems reviewed and are otherwise negative except as noted above.  Physical Exam  Blood pressure 176/130, pulse 41, temperature 98.5 F (36.9 C), temperature source Oral, resp. rate 13, SpO2 97.00%.  General: Pleasant, NAD Psych: Normal affect. Neuro: Alert and oriented X 3. Moves all extremities spontaneously. HEENT: Normal  Neck: Supple with left carotid bruit. Lungs:  Resp regular and unlabored, CTA. Heart: 2/6 SEM over RUSB.   Irregular rhythm.  no s3, s4, Abdomen:  RLQ tenderness, Soft, non-distended, BS + x 4.  Extremities:  +1 BLE edema. No clubbing, cyanosis. DP/PT/Radials 2+ and equal  bilaterally.  Labs  Troponin i, poc: 0.00  Lab Results  Component Value Date   WBC 6.5 04/25/2012   HGB 13.1 04/25/2012   HCT 37.7 04/25/2012   MCV 87.5 04/25/2012   PLT 189 04/25/2012     Recent Labs Lab 04/25/12 1357  NA 141  K 4.1  CL 104  CO2 28  BUN 13  CREATININE 0.76  CALCIUM 9.6  GLUCOSE 122*    Radiology/Studies  Dg Chest Port 1 View  04/25/2012  *RADIOLOGY REPORT*  Clinical Data: Irregular heartbeat  PORTABLE CHEST - 1  VIEW  Comparison: 04/13/2007  Findings: The heart is mildly enlarged in size.  A small right- sided pleural effusion is noted.  The lungs are otherwise clear. Bony abnormality is seen.  IMPRESSION: Small right-sided pleural effusion.   Original Report Authenticated By: Alcide Clever, M.D.    ECG:    Rsr, 79, freq pvc's, no acute st/t changes.   ASSESSMENT AND PLAN:    1.  Chest pain/cad:  In setting of abd bloating.  Typical/atypcial features.  Observe, cycle ce.  Plan MV in am.  Cont home meds.  Add PPI.   2.  Freq PVC's:  Check Mg, tsh.  Cont bb.  R/o ischemia.  3.  Htn:  bp up in ED.  Follow and adjust home meds as necessary.  4.  HL:  Cont statin Rx.  Signed, Nicolasa Ducking, NP 04/25/2012, 6:46 PM Patient seen and examined and history reviewed. Agree with above findings and plan. Pleasant 79 yo WF with history of CAD s/p multiple interventions in the past. Last ischemic evaluation in 2008. Now presents with symptoms of abdominal bloating and gas with frequent belching. She has some chest pain relieved with belching. She has some dyspnea on exertion. She reports some similar symptoms post gallbladder surgery that sounds like partial SBO. On monitor she is noted to have frequent PVCs in a pattern of bigeminy. Exam is unremarkable. We will observe overnight and cycle cardiac enzymes. Will plan on lexiscan myoview in am. If negative consider GI evaluation as outpatient.  Theron Arista [facility] 04/25/2012 9:21 PM

## 2012-04-25 NOTE — ED Provider Notes (Signed)
  Physical Exam  BP 176/130  Pulse 41  Temp(Src) 98.5 F (36.9 C) (Oral)  Resp 13  SpO2 97%  Physical Exam  ED Course  Procedures  MDM: 79 yo F w/symptomatic bradycardia; worsening dyspnea and peripheral edema. Cardiology evaluated in ED and will admit.       Clemetine Marker, MD 04/25/12 [phone]

## 2012-04-26 ENCOUNTER — Observation Stay (HOSPITAL_COMMUNITY): Payer: Medicare Other

## 2012-04-26 ENCOUNTER — Encounter (HOSPITAL_COMMUNITY): Payer: Medicare Other

## 2012-04-26 DIAGNOSIS — I209 Angina pectoris, unspecified: Secondary | ICD-10-CM | POA: Diagnosis not present

## 2012-04-26 DIAGNOSIS — R079 Chest pain, unspecified: Secondary | ICD-10-CM | POA: Diagnosis not present

## 2012-04-26 DIAGNOSIS — I2510 Atherosclerotic heart disease of native coronary artery without angina pectoris: Secondary | ICD-10-CM | POA: Diagnosis not present

## 2012-04-26 DIAGNOSIS — R9431 Abnormal electrocardiogram [ECG] [EKG]: Secondary | ICD-10-CM | POA: Diagnosis not present

## 2012-04-26 DIAGNOSIS — I10 Essential (primary) hypertension: Secondary | ICD-10-CM | POA: Diagnosis not present

## 2012-04-26 DIAGNOSIS — E785 Hyperlipidemia, unspecified: Secondary | ICD-10-CM | POA: Diagnosis not present

## 2012-04-26 LAB — TROPONIN I: Troponin I: <0.3 ng/mL (ref <0.30)

## 2012-04-26 LAB — TSH: TSH: 3.312 u[IU]/mL (ref 0.350–4.500)

## 2012-04-26 MED ORDER — REGADENOSON 0.4 MG/5ML IV SOLN
INTRAVENOUS | Status: AC
  Administered 2012-04-26: 0.4 mg via INTRAVENOUS
  Filled 2012-04-26: qty 5

## 2012-04-26 MED ORDER — SODIUM CHLORIDE 0.9 % IJ SOLN: 3.0000 mL | INTRAMUSCULAR | Status: DC | PRN

## 2012-04-26 MED ORDER — TECHNETIUM TC 99M SESTAMIBI GENERIC - CARDIOLITE
10.0000 | Freq: Once | INTRAVENOUS | Status: AC | PRN
  Administered 2012-04-26: 10 via INTRAVENOUS

## 2012-04-26 MED ORDER — SODIUM CHLORIDE 0.9 % IV SOLN
INTRAVENOUS | Status: DC
  Administered 2012-04-27: 04:00:00 via INTRAVENOUS

## 2012-04-26 MED ORDER — TECHNETIUM TC 99M SESTAMIBI GENERIC - CARDIOLITE
30.0000 | Freq: Once | INTRAVENOUS | Status: AC | PRN
  Administered 2012-04-26: 30 via INTRAVENOUS

## 2012-04-26 MED ORDER — SODIUM CHLORIDE 0.9 % IJ SOLN: 3.0000 mL | Freq: Two times a day (BID) | INTRAMUSCULAR | Status: DC

## 2012-04-26 MED ORDER — SODIUM CHLORIDE 0.9 % IV SOLN: 250.0000 mL | INTRAVENOUS | Status: DC | PRN

## 2012-04-26 NOTE — Progress Notes (Signed)
Lexiscan Myoview performed. 

## 2012-04-26 NOTE — ED Provider Notes (Signed)
I saw and evaluated the patient, reviewed the resident's note and I agree with the findings and plan.  Tobin Chad, MD 04/26/12 [phone]

## 2012-04-26 NOTE — ED Provider Notes (Signed)
Medical screening examination/treatment/procedure(s) were performed by non-physician practitioner and as supervising physician I was immediately available for consultation/collaboration.  Tobin Chad, MD 04/26/12 [phone]

## 2012-04-26 NOTE — Progress Notes (Signed)
Findings discussed with the patient in detail  Study suggests potentially significant ischemia.  Unfortunately, she previously had cardiac arrest associated with anterior MI.  Would favor repeat cardiac catheterization.  Will place orders and get scheduled in the am.  Patient agreeable.

## 2012-04-26 NOTE — Progress Notes (Addendum)
Patient Name: [name] Date of Encounter: 04/26/2012  Principal Problem:   CHEST PAIN-UNSPECIFIED Active Problems:   HYPERCHOLESTEROLEMIA  IIA   HYPERTENSION, UNSPECIFIED   CAD, UNSPECIFIED SITE   PVC's (premature ventricular contractions)    SUBJECTIVE: No chest pain, no SOB. Knows her heart skips but doesn't feel it right now (bigeminy).  OBJECTIVE Filed Vitals:   04/25/12 2047 04/26/12 0116 04/26/12 0530 04/26/12 0600  BP: 170/108 149/54 156/91   Pulse: 47 85 42 90  Temp: 98 F (36.7 C)  98.5 F (36.9 C)   TempSrc: Oral  Oral   Resp: 14  18   Height: 5' 6.5" (1.689 m)     Weight: 192 lb (87.091 kg)     SpO2: 97%  94%     Intake/Output Summary (Last 24 hours) at 04/26/12 0954 Last data filed at 04/25/12 2153  Gross per 24 hour  Intake    240 ml  Output    900 ml  Net   -660 ml   Filed Weights   04/25/12 2047  Weight: 192 lb (87.091 kg)    PHYSICAL EXAM General: Well developed, well nourished, female in no acute distress. Head: Normocephalic, atraumatic.  Neck: Supple without bruits, JVD not elevated. Lungs:  Resp regular and unlabored, CTA bilaterally. Heart: RRR, S1, S2, no S3, S4, soft murmur; no rub. Abdomen: Soft, non-tender, non-distended, BS + x 4.  Extremities: No clubbing, cyanosis, no edema.  Neuro: Alert and oriented X 3. Moves all extremities spontaneously. Psych: Normal affect.  LABS: CBC: Recent Labs  04/25/12 1357 04/25/12 2134  WBC 6.5 7.1  HGB 13.1 13.9  HCT 37.7 39.7  MCV 87.5 87.6  PLT 189 209   INR: Recent Labs  04/25/12 1357  INR 1.02   Basic Metabolic Panel: Recent Labs  04/25/12 1357 04/25/12 1735 04/25/12 2134  NA 141  --   --   K 4.1  --   --   CL 104  --   --   CO2 28  --   --   GLUCOSE 122*  --   --   BUN 13  --   --   CREATININE 0.76  --  0.74  CALCIUM 9.6  --   --   MG  --  1.8  --    Cardiac Enzymes: Recent Labs  04/25/12 2134 04/26/12 0304  TROPONINI <0.30 <0.30    Recent Labs  04/25/12 1413  TROPIPOC 0.00   Lab Results  Component Value Date   TSH 3.312 04/25/2012    TELE:  SR with ventricular bigeminy      ECG: 25-Apr-2012 12:55:46 [facility]/ED ROUTINE RECORD Sinus rhythm with frequent Premature ventricular complexes Biatrial enlargement Abnormal ECG 61mm/s 14mm/mV 100Hz  8.0.1 12SL 241 HD CID: 1 Referred by: Confirmed By: Darcella Cheshire MD Vent. rate 79 BPM PR interval 172 ms QRS duration 88 ms QT/QTc 416/477 ms P-R-T axes 68 73 82  Radiology/Studies: Dg Chest Port 1 View 04/25/2012  *RADIOLOGY REPORT*  Clinical Data: Irregular heartbeat  PORTABLE CHEST - 1 VIEW  Comparison: 04/13/2007  Findings: The heart is mildly enlarged in size.  A small right- sided pleural effusion is noted.  The lungs are otherwise clear. Bony abnormality is seen.  IMPRESSION: Small right-sided pleural effusion.   Original Report Authenticated By: Alcide Clever, M.D.     Current Medications:  . amLODipine  10 mg Oral Daily  . aspirin  81 mg Oral Daily  . atorvastatin  20 mg Oral Daily  . calcium-vitamin D  1 tablet Oral Daily  . clopidogrel  75 mg Oral Daily  . estrogens (conjugated)  0.3 mg Oral Daily  . heparin  5,000 Units Subcutaneous Q8H  . hydrochlorothiazide  25 mg Oral Daily  . losartan  50 mg Oral Daily  . metoprolol succinate  100 mg Oral Daily  . multivitamin with minerals  1 tablet Oral Daily  . pantoprazole  40 mg Oral Q0600  . potassium chloride SA  20 mEq Oral BID  . regadenoson      . regadenoson  0.4 mg Intravenous Once  . sodium chloride  3 mL Intravenous Q12H      ASSESSMENT AND PLAN:  Principal Problem:   CHEST PAIN-UNSPECIFIED - enzymes negative for MI, MV to assess for ischemia. Hx CAD/PCI but last cath in 2008 no critical Dz, EF has been normal.  Otherwise, continue home meds including Toprol XL 100.  Active Problems:   HYPERCHOLESTEROLEMIA  IIA   HYPERTENSION, UNSPECIFIED   CAD, UNSPECIFIED SITE   PVC's (premature  ventricular contractions) - long hx PVCs, now with bigeminy, was present on admission. TSH is WNL and Mg OK. Continue BB, otherwise per MD.   Signed, Theodore Demark , PA-C 9:54 AM 04/26/2012  Patient was admitted with bigeminal rhythm.  She is stable.  I saw her down in nuc med getting ready for her study.  She does have rather continuous bigeminy-- will discuss with EP.  Will await nuclear imaging but would have a low threshold for repeat cath.

## 2012-04-26 NOTE — Progress Notes (Signed)
Utilization review completed.  

## 2012-04-27 ENCOUNTER — Encounter (HOSPITAL_COMMUNITY)
Admission: EM | Disposition: A | Discharge status: Home / Self Care | Payer: Self-pay | Source: Home / Self Care | Attending: Emergency Medicine

## 2012-04-27 DIAGNOSIS — I2510 Atherosclerotic heart disease of native coronary artery without angina pectoris: Principal | ICD-10-CM

## 2012-04-27 HISTORY — PX: LEFT HEART CATHETERIZATION WITH CORONARY ANGIOGRAM: SHX5451

## 2012-04-27 LAB — BASIC METABOLIC PANEL
CO2: 27 mEq/L (ref 19–32)
Calcium: 9.1 mg/dL (ref 8.4–10.5)
Chloride: 104 mEq/L (ref 96–112)
Creatinine, Ser: 0.86 mg/dL (ref 0.50–1.10)
Glucose, Bld: 123 mg/dL — ABNORMAL HIGH (ref 70–99)

## 2012-04-27 SURGERY — LEFT HEART CATHETERIZATION WITH CORONARY ANGIOGRAM
Anesthesia: LOCAL

## 2012-04-27 MED ORDER — LIDOCAINE HCL (PF) 1 % IJ SOLN
INTRAMUSCULAR | Status: AC
  Filled 2012-04-27: qty 30

## 2012-04-27 MED ORDER — MIDAZOLAM HCL 2 MG/2ML IJ SOLN
INTRAMUSCULAR | Status: AC
  Filled 2012-04-27: qty 2

## 2012-04-27 MED ORDER — SODIUM CHLORIDE 0.9 % IV SOLN
INTRAVENOUS | Status: AC
  Administered 2012-04-27: 16:00:00 via INTRAVENOUS

## 2012-04-27 MED ORDER — HEPARIN SODIUM (PORCINE) 1000 UNIT/ML IJ SOLN
INTRAMUSCULAR | Status: AC
  Filled 2012-04-27: qty 1

## 2012-04-27 MED ORDER — FENTANYL CITRATE 0.05 MG/ML IJ SOLN
INTRAMUSCULAR | Status: AC
  Filled 2012-04-27: qty 2

## 2012-04-27 MED ORDER — HEPARIN (PORCINE) IN NACL 2-0.9 UNIT/ML-% IJ SOLN
INTRAMUSCULAR | Status: AC
  Filled 2012-04-27: qty 1000

## 2012-04-27 MED ORDER — VERAPAMIL HCL 2.5 MG/ML IV SOLN
INTRAVENOUS | Status: AC
  Filled 2012-04-27: qty 2

## 2012-04-27 NOTE — Progress Notes (Signed)
TR BAND REMOVAL  LOCATION:    right radial  DEFLATED PER PROTOCOL:    yes  TIME BAND OFF / DRESSING APPLIED:    1645  SITE UPON ARRIVAL:    Level 0  SITE AFTER BAND REMOVAL:    Level 0  REVERSE ALLEN'S TEST:     positive  CIRCULATION SENSATION AND MOVEMENT:    Within Normal Limits   yes  COMMENTS:   Tolerated procdure well

## 2012-04-27 NOTE — Interval H&P Note (Signed)
History and Physical Interval Note:  04/27/2012 2:44 PM  [name]  has presented today for surgery, with the diagnosis of cp  The various methods of treatment have been discussed with the patient and family. After consideration of risks, benefits and other options for treatment, the patient has consented to  Procedure(s): LEFT HEART CATHETERIZATION WITH CORONARY ANGIOGRAM (N/A) as a surgical intervention .  The patient's history has been reviewed, patient examined, no change in status, stable for surgery.  I have reviewed the patient's chart and labs.  Questions were answered to the patient's satisfaction.     Lorine Bears

## 2012-04-27 NOTE — CV Procedure (Signed)
   Cardiac Catheterization Procedure Note  Name: [name] MRN: [id number] DOB: 1930/04/01  Procedure: Left Heart Cath, Selective Coronary Angiography, LV angiography  Indication: PVCs with abnormal stress test.  Medications:  Sedation:  1 mg IV Versed, 25 mcg IV Fentanyl  Contrast:  65 mL Omnipaque   Procedural Details: The right wrist was prepped, draped, and anesthetized with 1% lidocaine. Using the modified Seldinger technique, a 5 French sheath was introduced into the right radial artery. 3 mg of verapamil was administered through the sheath, weight-based unfractionated heparin was administered intravenously. A JACKIE catheter was used for selective coronary angiography. A pigtail catheter was used for left ventriculography. Catheter exchanges were performed over an exchange length guidewire. There were no immediate procedural complications. A TR band was used for radial hemostasis at the completion of the procedure.  The patient was transferred to the post catheterization recovery area for further monitoring.  Procedural Findings:  Hemodynamics: AO:  127/48   mmHg LV:  130/8    mmHg LVEDP: 13  mmHg  Coronary angiography: Coronary dominance: Right   Left Main:  Mildly calcified with no significant disease.  Left Anterior Descending (LAD):  Significantly calcified especially in the proximal and mid segments. The stent is noted proximally with no significant restenosis. In the midsegment, there is diffuse 30-40% disease. The distal segment has minor irregularities.  1st diagonal (D1):  Very small in size and diffusely diseased.  2nd diagonal (D2):  Small in size with minor irregularities.  3rd diagonal (D3):  Very small in size.  Circumflex (LCx):  Normal in size and nondominant. The vessel is moderately calcified. In the midsegment, there appears to be a stent with diffuse 20% in-stent restenosis.  1st obtuse marginal:  Small in size with minor irregularities.  2nd  obtuse marginal:  Medium in size with mild ostial disease.  3rd obtuse marginal:  Normal in size with minor irregularities.   Right Coronary Artery: Normal in size and dominant. The vessel is occluded proximally. It fills via very good left to right collaterals.  Left ventriculography: Left ventricular systolic function is normal , LVEF is estimated at 65 %, there is mild significant mitral regurgitation   Final Conclusions:   1. Underlying three-vessel coronary artery disease with occluded proximal right coronary artery with good left to right collaterals. This is new since the most recent cardiac catheterization. The stents in the LAD and left circumflex artery are patent with no significant restenosis. 2. Normal LV systolic function.  Recommendations:  Medical therapy. She is having frequent PVCs but is already on a beta blocker. She might require a Holter monitor to quantify the PVCs in a 24-hour period and determine if further treatment is warranted.  Lorine Bears MD, [facility] 04/27/2012, 2:50 PM

## 2012-04-27 NOTE — H&P (View-Only) (Signed)
 Patient Name: [name] Date of Encounter: 04/27/2012     Principal Problem:   CHEST PAIN-UNSPECIFIED Active Problems:   HYPERCHOLESTEROLEMIA  IIA   HYPERTENSION, UNSPECIFIED   CAD, UNSPECIFIED SITE   PVC's (premature ventricular contractions)    SUBJECTIVE  Patient seen and situation discussed.  She did not go to Dr.Stoneking because of specific cardiac complains, but rather due to abdominal bloating and fullness, perhaps more in the upper abdomen.  She would give out going to the mailbox, and also sometimes exertion would bring on some of these feelings.  She has had some atypical sharp chest pain that is brief since her lung surgery.  She has been in fairly continuous bigeminal rhythm, but without consequence.    CURRENT MEDS . amLODipine  10 mg Oral Daily  . aspirin  81 mg Oral Daily  . atorvastatin  20 mg Oral Daily  . calcium-vitamin D  1 tablet Oral Daily  . clopidogrel  75 mg Oral Daily  . estrogens (conjugated)  0.3 mg Oral Daily  . heparin  5,000 Units Subcutaneous Q8H  . hydrochlorothiazide  25 mg Oral Daily  . losartan  50 mg Oral Daily  . metoprolol succinate  100 mg Oral Daily  . multivitamin with minerals  1 tablet Oral Daily  . pantoprazole  40 mg Oral Q0600  . potassium chloride SA  20 mEq Oral BID  . sodium chloride  3 mL Intravenous Q12H  . sodium chloride  3 mL Intravenous Q12H    OBJECTIVE  Filed Vitals:   04/26/12 1011 04/26/12 1338 04/26/12 2100 04/27/12 0514  BP: 159/57 130/64 145/63 148/65  Pulse:  78 87 82  Temp:  98.4 F (36.9 C) 98.4 F (36.9 C) 98.7 F (37.1 C)  TempSrc:  Oral Oral Oral  Resp:  17    Height:      Weight:    186 lb (84.369 kg)  SpO2:  96% 99% 97%    Intake/Output Summary (Last 24 hours) at 04/27/12 0811 Last data filed at 04/26/12 1200  Gross per 24 hour  Intake    360 ml  Output      0 ml  Net    360 ml   Filed Weights   04/25/12 2047 04/27/12 0514  Weight: 192 lb (87.091 kg) 186 lb (84.369 kg)     PHYSICAL EXAM  General: Pleasant, NAD. Neuro: Alert and oriented X 3. Moves all extremities spontaneously. Psych: Normal affect. HEENT:  Normal.  Mild abnl of JVP, but ectopy as noted so hard to evaluate.    Neck: ?elev of JVP--but ectopy on a closed TV.   Lungs:  Resp regular and unlabored, CTA. Heart: RRR no s3, s4, or murmurs.  Mild SEM on long cycle beats.   Abdomen: Soft, non-tender, non-distended, BS + x 4.  Extremities: No clubbing, cyanosis or edema. DP/PT/Radials 2+ and equal bilaterally.  Accessory Clinical Findings  CBC  Recent Labs  04/25/12 1357 04/25/12 2134  WBC 6.5 7.1  HGB 13.1 13.9  HCT 37.7 39.7  MCV 87.5 87.6  PLT 189 209   Basic Metabolic Panel  Recent Labs  04/25/12 1357 04/25/12 1735 04/25/12 2134  NA 141  --   --   K 4.1  --   --   CL 104  --   --   CO2 28  --   --   GLUCOSE 122*  --   --   BUN 13  --   --     CREATININE 0.76  --  0.74  CALCIUM 9.6  --   --   MG  --  1.8  --    Liver Function Tests No results found for this basename: AST, ALT, ALKPHOS, BILITOT, PROT, ALBUMIN,  in the last 72 hours No results found for this basename: LIPASE, AMYLASE,  in the last 72 hours Cardiac Enzymes  Recent Labs  04/25/12 2134 04/26/12 0304 04/26/12 1212  TROPONINI <0.30 <0.30 <0.30   BNP No components found with this basename: POCBNP,  D-Dimer No results found for this basename: DDIMER,  in the last 72 hours Hemoglobin A1C No results found for this basename: HGBA1C,  in the last 72 hours Fasting Lipid Panel No results found for this basename: CHOL, HDL, LDLCALC, TRIG, CHOLHDL, LDLDIRECT,  in the last 72 hours Thyroid Function Tests  Recent Labs  04/25/12 2134  TSH 3.312    TELE  bigeminy  Prior Echo at [facility] CV:  Nl LV, mild AR, and MR.    Radiology/Studies  Nm Myocar Multi W/spect W/wall Motion / Ef  04/26/2012  *RADIOLOGY REPORT*  Clinical Data:  Chest pain  MYOCARDIAL IMAGING WITH SPECT (REST AND PHARMACOLOGIC-STRESS)  GATED LEFT VENTRICULAR WALL MOTION STUDY LEFT VENTRICULAR EJECTION FRACTION  Technique:  Standard myocardial SPECT imaging was performed after resting intravenous injection of 10 mCi Tc-99m Sestamibi. Subsequently, intravenous infusion of regadenoson was performed under the supervision of the Cardiology staff.  At peak effect of the drug, 30 mCi Tc-99m sestamibi was injected intravenously and standard myocardial SPECT  imaging was performed.  Quantitative gated imaging was also performed to evaluate left ventricular wall motion, and estimate left ventricular ejection fraction.  Comparison:  None.  Findings: Planar images showed a small hot nodule on the right anterior chest wall.  This was seen well only with the stress images.  Gated images showed normal wall motion with EF 70%.  At rest, there was a small, mild mid anteroseptal perfusion defect .  With stress, there was a medium-sized, moderate basal to mid inferolateral and basal inferior perfusion defect and a small, mild to moderate mid anteroseptal perfusion defect.  IMPRESSION: 1. Small "hot nodule" anterior chest wall on the right, seen best on stress planar images.  Would make sure that patient gets mammogram.  2. Normal EF (70%) and wall motion.  3. Partially reversible small anteroseptal perfusion defect may be due to soft tissue attenuation.  Medium sized, moderate basal to mid inferolateral and basal inferior reversible perfusion defect is concerning for ischemia.  4. Moderate risk study.   Original Report Authenticated By: Dalton Mclean    Dg Chest Port 1 View  04/25/2012  *RADIOLOGY REPORT*  Clinical Data: Irregular heartbeat  PORTABLE CHEST - 1 VIEW  Comparison: 04/13/2007  Findings: The heart is mildly enlarged in size.  A small right- sided pleural effusion is noted.  The lungs are otherwise clear. Bony abnormality is seen.  IMPRESSION: Small right-sided pleural effusion.   Original Report Authenticated By: Mark Lukens, M.D.     ASSESSMENT  AND PLAN  1.  Atypical symptoms with abnormal nuclear imaging in high risk patient  (3 vessel PCI in the 80s, and prior STEMI treated with stent.  Last cath data:   HEMODYNAMIC DATA:  1. Central aortic pressure 169/72, mean 116.  2. Left atrial pressure 171/10.  3. No gradient on pullback across the aortic valve.  ANGIOGRAPHIC DATA:  1. Ventriculography is done in the RAO projection. Overall systolic  function is preserved. No   segmental abnormalities of contraction  were identified.  2. The left main coronary artery is free of critical disease but does  demonstrate a fair amount of calcification.  3. The LAD courses to the apex. There is a proximally-placed Pixel  stent, which does not demonstrate significant restenosis. There is  perhaps at most 30% narrowing. The remainder of the LAD is a  calcified vessel that is diffusely diseased. There are probably  areas of 40% narrowing in the midvessel. The distal vessel wraps  the apex.  4. The circumflex is also a calcified vessel. There is diffuse  proximal calcification. In the midportion of the vessel is a  stent. She has also had PTCA distally and this vessel was widely  patent. There are diffuse luminal irregularity changes in the  whole distal marginal system but no critical areas of stenosis.  5. The right coronary artery is a dominant vessel providing a  posterior descending and three posterolateral branches.  Importantly, the previous study had demonstrated a 50% area of  diffuse narrowing in the midvessel. The midvessel is hypodense but  we did do an LAO left lateral shot to better identify this area.  The midvessel is calcified and there is at least 30-40% plaquing in  the midvessel, but it does not appear to be critical. More  proximally there is also diffuse luminal irregularity and calcified  vessel. The distal vessel was without critical narrowing.  6. Ventriculography in the RAO projection reveals vigorous global   systolic function without segmental wall motion abnormality.  CONCLUSION:  1. Preserved left ventricular function.  2. Diffuse coronary abnormalities without critical narrowing and no  evidence of significant stent restenosis at the present time.  DISPOSITION: Risk factor reduction will be recommended and the patient  will be treated medically. I will see her back in the office in 1-2  weeks in follow-up.  2.  Hot "spot" on nuclear scan      Inquire regarding mammography  Risks, benefits and alternatives of cath discussed with patient and she is willing to proceed.      Signed, Sherryn Pollino MD, FACC, FSCAI 

## 2012-04-27 NOTE — Progress Notes (Signed)
Patient Name: [name] Date of Encounter: 04/27/2012     Principal Problem:   CHEST PAIN-UNSPECIFIED Active Problems:   HYPERCHOLESTEROLEMIA  IIA   HYPERTENSION, UNSPECIFIED   CAD, UNSPECIFIED SITE   PVC's (premature ventricular contractions)    SUBJECTIVE  Patient seen and situation discussed.  She did not go to Dr.Stoneking because of specific cardiac complains, but rather due to abdominal bloating and fullness, perhaps more in the upper abdomen.  She would give out going to the mailbox, and also sometimes exertion would bring on some of these feelings.  She has had some atypical sharp chest pain that is brief since her lung surgery.  She has been in fairly continuous bigeminal rhythm, but without consequence.    CURRENT MEDS . amLODipine  10 mg Oral Daily  . aspirin  81 mg Oral Daily  . atorvastatin  20 mg Oral Daily  . calcium-vitamin D  1 tablet Oral Daily  . clopidogrel  75 mg Oral Daily  . estrogens (conjugated)  0.3 mg Oral Daily  . heparin  5,000 Units Subcutaneous Q8H  . hydrochlorothiazide  25 mg Oral Daily  . losartan  50 mg Oral Daily  . metoprolol succinate  100 mg Oral Daily  . multivitamin with minerals  1 tablet Oral Daily  . pantoprazole  40 mg Oral Q0600  . potassium chloride SA  20 mEq Oral BID  . sodium chloride  3 mL Intravenous Q12H  . sodium chloride  3 mL Intravenous Q12H    OBJECTIVE  Filed Vitals:   04/26/12 1011 04/26/12 1338 04/26/12 2100 04/27/12 0514  BP: 159/57 130/64 145/63 148/65  Pulse:  78 87 82  Temp:  98.4 F (36.9 C) 98.4 F (36.9 C) 98.7 F (37.1 C)  TempSrc:  Oral Oral Oral  Resp:  17    Height:      Weight:    186 lb (84.369 kg)  SpO2:  96% 99% 97%    Intake/Output Summary (Last 24 hours) at 04/27/12 0811 Last data filed at 04/26/12 1200  Gross per 24 hour  Intake    360 ml  Output      0 ml  Net    360 ml   Filed Weights   04/25/12 2047 04/27/12 0514  Weight: 192 lb (87.091 kg) 186 lb (84.369 kg)     PHYSICAL EXAM  General: Pleasant, NAD. Neuro: Alert and oriented X 3. Moves all extremities spontaneously. Psych: Normal affect. HEENT:  Normal.  Mild abnl of JVP, but ectopy as noted so hard to evaluate.    Neck: ?elev of JVP--but ectopy on a closed TV.   Lungs:  Resp regular and unlabored, CTA. Heart: RRR no s3, s4, or murmurs.  Mild SEM on long cycle beats.   Abdomen: Soft, non-tender, non-distended, BS + x 4.  Extremities: No clubbing, cyanosis or edema. DP/PT/Radials 2+ and equal bilaterally.  Accessory Clinical Findings  CBC  Recent Labs  04/25/12 1357 04/25/12 2134  WBC 6.5 7.1  HGB 13.1 13.9  HCT 37.7 39.7  MCV 87.5 87.6  PLT 189 209   Basic Metabolic Panel  Recent Labs  04/25/12 1357 04/25/12 1735 04/25/12 2134  NA 141  --   --   K 4.1  --   --   CL 104  --   --   CO2 28  --   --   GLUCOSE 122*  --   --   BUN 13  --   --  CREATININE 0.76  --  0.74  CALCIUM 9.6  --   --   MG  --  1.8  --    Liver Function Tests No results found for this basename: AST, ALT, ALKPHOS, BILITOT, PROT, ALBUMIN,  in the last 72 hours No results found for this basename: LIPASE, AMYLASE,  in the last 72 hours Cardiac Enzymes  Recent Labs  04/25/12 2134 04/26/12 0304 04/26/12 1212  TROPONINI <0.30 <0.30 <0.30   BNP No components found with this basename: POCBNP,  D-Dimer No results found for this basename: DDIMER,  in the last 72 hours Hemoglobin A1C No results found for this basename: HGBA1C,  in the last 72 hours Fasting Lipid Panel No results found for this basename: CHOL, HDL, LDLCALC, TRIG, CHOLHDL, LDLDIRECT,  in the last 72 hours Thyroid Function Tests  Recent Labs  04/25/12 2134  TSH 3.312    TELE  bigeminy  Prior Echo at [place] CV:  Nl LV, mild AR, and MR.    Radiology/Studies  Nm Myocar Multi W/spect W/wall Motion / Ef  04/26/2012  *RADIOLOGY REPORT*  Clinical Data:  Chest pain  MYOCARDIAL IMAGING WITH SPECT (REST AND PHARMACOLOGIC-STRESS)  GATED LEFT VENTRICULAR WALL MOTION STUDY LEFT VENTRICULAR EJECTION FRACTION  Technique:  Standard myocardial SPECT imaging was performed after resting intravenous injection of 10 mCi Tc-53m Sestamibi. Subsequently, intravenous infusion of regadenoson was performed under the supervision of the Cardiology staff.  At peak effect of the drug, 30 mCi Tc-89m sestamibi was injected intravenously and standard myocardial SPECT  imaging was performed.  Quantitative gated imaging was also performed to evaluate left ventricular wall motion, and estimate left ventricular ejection fraction.  Comparison:  None.  Findings: Planar images showed a small hot nodule on the right anterior chest wall.  This was seen well only with the stress images.  Gated images showed normal wall motion with EF 70%.  At rest, there was a small, mild mid anteroseptal perfusion defect .  With stress, there was a medium-sized, moderate basal to mid inferolateral and basal inferior perfusion defect and a small, mild to moderate mid anteroseptal perfusion defect.  IMPRESSION: 1. Small "hot nodule" anterior chest wall on the right, seen best on stress planar images.  Would make sure that patient gets mammogram.  2. Normal EF (70%) and wall motion.  3. Partially reversible small anteroseptal perfusion defect may be due to soft tissue attenuation.  Medium sized, moderate basal to mid inferolateral and basal inferior reversible perfusion defect is concerning for ischemia.  4. Moderate risk study.   Original Report Authenticated By: Marca Ancona    Dg Chest Port 1 View  04/25/2012  *RADIOLOGY REPORT*  Clinical Data: Irregular heartbeat  PORTABLE CHEST - 1 VIEW  Comparison: 04/13/2007  Findings: The heart is mildly enlarged in size.  A small right- sided pleural effusion is noted.  The lungs are otherwise clear. Bony abnormality is seen.  IMPRESSION: Small right-sided pleural effusion.   Original Report Authenticated By: Alcide Clever, M.D.     ASSESSMENT  AND PLAN  1.  Atypical symptoms with abnormal nuclear imaging in high risk patient  (3 vessel PCI in the 80s, and prior STEMI treated with stent.  Last cath data:   HEMODYNAMIC DATA:  1. Central aortic pressure 169/72, mean 116.  2. Left atrial pressure 171/10.  3. No gradient on pullback across the aortic valve.  ANGIOGRAPHIC DATA:  1. Ventriculography is done in the RAO projection. Overall systolic  function is preserved. No  segmental abnormalities of contraction  were identified.  2. The left main coronary artery is free of critical disease but does  demonstrate a fair amount of calcification.  3. The LAD courses to the apex. There is a proximally-placed Pixel  stent, which does not demonstrate significant restenosis. There is  perhaps at most 30% narrowing. The remainder of the LAD is a  calcified vessel that is diffusely diseased. There are probably  areas of 40% narrowing in the midvessel. The distal vessel wraps  the apex.  4. The circumflex is also a calcified vessel. There is diffuse  proximal calcification. In the midportion of the vessel is a  stent. She has also had PTCA distally and this vessel was widely  patent. There are diffuse luminal irregularity changes in the  whole distal marginal system but no critical areas of stenosis.  5. The right coronary artery is a dominant vessel providing a  posterior descending and three posterolateral branches.  Importantly, the previous study had demonstrated a 50% area of  diffuse narrowing in the midvessel. The midvessel is hypodense but  we did do an LAO left lateral shot to better identify this area.  The midvessel is calcified and there is at least 30-40% plaquing in  the midvessel, but it does not appear to be critical. More  proximally there is also diffuse luminal irregularity and calcified  vessel. The distal vessel was without critical narrowing.  6. Ventriculography in the RAO projection reveals vigorous global   systolic function without segmental wall motion abnormality.  CONCLUSION:  1. Preserved left ventricular function.  2. Diffuse coronary abnormalities without critical narrowing and no  evidence of significant stent restenosis at the present time.  DISPOSITION: Risk factor reduction will be recommended and the patient  will be treated medically. I will see her back in the office in 1-2  weeks in follow-up.  2.  Hot "spot" on nuclear scan      Inquire regarding mammography  Risks, benefits and alternatives of cath discussed with patient and she is willing to proceed.      Signed, Shawnie Pons MD, [facility], FSCAI

## 2012-04-28 DIAGNOSIS — I2510 Atherosclerotic heart disease of native coronary artery without angina pectoris: Secondary | ICD-10-CM | POA: Diagnosis not present

## 2012-04-28 DIAGNOSIS — E785 Hyperlipidemia, unspecified: Secondary | ICD-10-CM | POA: Diagnosis not present

## 2012-04-28 DIAGNOSIS — R9431 Abnormal electrocardiogram [ECG] [EKG]: Secondary | ICD-10-CM | POA: Diagnosis not present

## 2012-04-28 DIAGNOSIS — I10 Essential (primary) hypertension: Secondary | ICD-10-CM | POA: Diagnosis not present

## 2012-04-28 DIAGNOSIS — R079 Chest pain, unspecified: Secondary | ICD-10-CM | POA: Diagnosis not present

## 2012-04-28 DIAGNOSIS — I209 Angina pectoris, unspecified: Secondary | ICD-10-CM | POA: Diagnosis not present

## 2012-04-28 MED ORDER — METOPROLOL SUCCINATE ER 100 MG PO TB24: 100.0000 mg | ORAL_TABLET | Freq: Every day | ORAL | Status: DC

## 2012-04-28 MED ORDER — PANTOPRAZOLE SODIUM 40 MG PO TBEC: 40.0000 mg | DELAYED_RELEASE_TABLET | Freq: Every day | ORAL | Status: DC

## 2012-04-28 NOTE — Progress Notes (Signed)
Patient Name: [name] Date of Encounter: 04/28/2012     Principal Problem:   CHEST PAIN-UNSPECIFIED Active Problems:   HYPERCHOLESTEROLEMIA  IIA   HYPERTENSION, UNSPECIFIED   CAD, UNSPECIFIED SITE   PVC's (premature ventricular contractions)    SUBJECTIVE  She seems somewhat better.  No chest pain.  Findings reviewed in detail.    CURRENT MEDS . amLODipine  10 mg Oral Daily  . aspirin  81 mg Oral Daily  . atorvastatin  20 mg Oral Daily  . calcium-vitamin D  1 tablet Oral Daily  . clopidogrel  75 mg Oral Daily  . estrogens (conjugated)  0.3 mg Oral Daily  . heparin  5,000 Units Subcutaneous Q8H  . hydrochlorothiazide  25 mg Oral Daily  . losartan  50 mg Oral Daily  . metoprolol succinate  100 mg Oral Daily  . multivitamin with minerals  1 tablet Oral Daily  . pantoprazole  40 mg Oral Q0600  . potassium chloride SA  20 mEq Oral BID  . sodium chloride  3 mL Intravenous Q12H    OBJECTIVE  Filed Vitals:   04/27/12 2100 04/28/12 0036 04/28/12 0433 04/28/12 0739  BP: 151/59 153/62 159/68 135/44  Pulse: 52 70 76 86  Temp:  98.4 F (36.9 C) 98.2 F (36.8 C) 98.1 F (36.7 C)  TempSrc:  Oral Oral Oral  Resp:    15  Height:      Weight:   187 lb 2.7 oz (84.9 kg)   SpO2: 95% 96% 96% 98%    Intake/Output Summary (Last 24 hours) at 04/28/12 0919 Last data filed at 04/28/12 0857  Gross per 24 hour  Intake   1060 ml  Output   1850 ml  Net   -790 ml   Filed Weights   04/25/12 2047 04/27/12 0514 04/28/12 0433  Weight: 192 lb (87.091 kg) 186 lb (84.369 kg) 187 lb 2.7 oz (84.9 kg)    PHYSICAL EXAM  General: Pleasant, NAD. Neuro: Alert and oriented X 3. Moves all extremities spontaneously. Psych: Normal affect. HEENT:  Normal  Neck: Supple without bruits or JVD. Lungs:  Resp regular and unlabored, CTA. Heart: RRR no s3, s4, or murmurs.  Extra beats.   Abdomen: Soft, non-tender, non-distended, BS + x 4.  Extremities: No clubbing, cyanosis or edema.  DP/PT/Radials 2+ and equal bilaterally.  Accessory Clinical Findings  CBC  Recent Labs  04/25/12 1357 04/25/12 2134  WBC 6.5 7.1  HGB 13.1 13.9  HCT 37.7 39.7  MCV 87.5 87.6  PLT 189 209   Basic Metabolic Panel  Recent Labs  04/25/12 1357 04/25/12 1735 04/25/12 2134 04/27/12 0840  NA 141  --   --  137  K 4.1  --   --  3.9  CL 104  --   --  104  CO2 28  --   --  27  GLUCOSE 122*  --   --  123*  BUN 13  --   --  12  CREATININE 0.76  --  0.74 0.86  CALCIUM 9.6  --   --  9.1  MG  --  1.8  --   --    Liver Function Tests No results found for this basename: AST, ALT, ALKPHOS, BILITOT, PROT, ALBUMIN,  in the last 72 hours No results found for this basename: LIPASE, AMYLASE,  in the last 72 hours Cardiac Enzymes  Recent Labs  04/25/12 2134 04/26/12 0304 04/26/12 1212  TROPONINI <0.30 <0.30 <0.30  BNP No components found with this basename: POCBNP,  D-Dimer No results found for this basename: DDIMER,  in the last 72 hours Hemoglobin A1C No results found for this basename: HGBA1C,  in the last 72 hours Fasting Lipid Panel No results found for this basename: CHOL, HDL, LDLCALC, TRIG, CHOLHDL, LDLDIRECT,  in the last 72 hours Thyroid Function Tests  Recent Labs  04/25/12 2134  TSH 3.312    TELE  Bigeminal rhythm.    ECG    Radiology/Studies  Nm Myocar Multi W/spect W/wall Motion / Ef  04/26/2012  *RADIOLOGY REPORT*  Clinical Data:  Chest pain  MYOCARDIAL IMAGING WITH SPECT (REST AND PHARMACOLOGIC-STRESS) GATED LEFT VENTRICULAR WALL MOTION STUDY LEFT VENTRICULAR EJECTION FRACTION  Technique:  Standard myocardial SPECT imaging was performed after resting intravenous injection of 10 mCi Tc-77m Sestamibi. Subsequently, intravenous infusion of regadenoson was performed under the supervision of the Cardiology staff.  At peak effect of the drug, 30 mCi Tc-58m sestamibi was injected intravenously and standard myocardial SPECT  imaging was performed.  Quantitative  gated imaging was also performed to evaluate left ventricular wall motion, and estimate left ventricular ejection fraction.  Comparison:  None.  Findings: Planar images showed a small hot nodule on the right anterior chest wall.  This was seen well only with the stress images.  Gated images showed normal wall motion with EF 70%.  At rest, there was a small, mild mid anteroseptal perfusion defect .  With stress, there was a medium-sized, moderate basal to mid inferolateral and basal inferior perfusion defect and a small, mild to moderate mid anteroseptal perfusion defect.  IMPRESSION: 1. Small "hot nodule" anterior chest wall on the right, seen best on stress planar images.  Would make sure that patient gets mammogram.  2. Normal EF (70%) and wall motion.  3. Partially reversible small anteroseptal perfusion defect may be due to soft tissue attenuation.  Medium sized, moderate basal to mid inferolateral and basal inferior reversible perfusion defect is concerning for ischemia.  4. Moderate risk study.   Original Report Authenticated By: Marca Ancona    Dg Chest Port 1 View  04/25/2012  *RADIOLOGY REPORT*  Clinical Data: Irregular heartbeat  PORTABLE CHEST - 1 VIEW  Comparison: 04/13/2007  Findings: The heart is mildly enlarged in size.  A small right- sided pleural effusion is noted.  The lungs are otherwise clear. Bony abnormality is seen.  IMPRESSION: Small right-sided pleural effusion.   Original Report Authenticated By: Alcide Clever, M.D.     ASSESSMENT AND PLAN  1.  CAD with findings as suggested by prior history.  Films reviewed.  Dr. Kirke Corin recommended medical therapy and started her on beta blockade for both PVCs and ischemia.  The RCA is fully collateralized and she is not all that symptomatic.  As such would favor continuing beta blockers with early fu with Dr. Mariah Milling.  She lives in [place].  If symptoms are clearly ischemic, her RCA could be approached percutaneously.    2.  Bigeminy:  I spoke with  Dr. Johney Frame and we reviewed strips.  He suggested initial beta blockers for her.  This can be reasessed as an outpatient.   3.  See "hot spot"---will get Ms. Barrett to document exam and we will find out about mammography and the findings.  4.  Carotid disease ---  Has follow up.    5.  Hyperlipidemia--  7.  History of carcinoid  Prob dc today with fu with Dr. Mariah Milling.  Signed, Shawnie Pons MD, [facility], FSCAI

## 2012-04-28 NOTE — Discharge Summary (Signed)
CARDIOLOGY DISCHARGE SUMMARY   Patient ID: [name] MRN: [id number] DOB/AGE: August 05, 1930 79 y.o.  Admit date: 04/25/2012 Discharge date: 04/28/2012  Primary Discharge Diagnosis:    Angina at rest  Secondary Discharge Diagnosis:    HYPERCHOLESTEROLEMIA  IIA   HYPERTENSION, UNSPECIFIED   CAD, UNSPECIFIED SITE   PVC's (premature ventricular contractions)  Procedures: Lexi scan Myoview, Left Heart Cath, Selective Coronary Angiography, LV angiography   Hospital Course: AAMIRA BISCHOFF is a 79 y.o. female with a history of CAD. She went to her primary care physician for some abdominal discomfort and was noted to be in ventricular bigeminy. She was transferred to [facility] and admitted for further evaluation and treatment.  Her cardiac enzymes were negative for MI. She had a Lexi scan Myoview on 04/26/2012. Full results are below but it showed both a partially reversible cardiac defect and a "hot spot" on the right side of her chest concerning for neoplasm. A mammogram is recommended. A breast exam was performed but no mass was detected. The patient is aware of the need for a mammogram and will follow up with her primary care physician on the results.  With her abnormal Myoview, a cardiac catheterization was recommended. This was performed on 04/27/2012. The full results are below but medical therapy was recommended. She tolerated the procedure well.  On 04/28/2012, Ms. Woolen was evaluated by Dr. Riley Kill. She was having no new problems. She continued to have bigeminy but she is already on a beta blocker and the dose had been increased. Currently, her blood pressure is well controlled so no further dose changes were made. Dr. Riley Kill felt Ms. Gaugh was stable for discharge, to follow up as an outpatient in [place].   Labs:   Lab Results  Component Value Date   WBC 7.1 04/25/2012   HGB 13.9 04/25/2012   HCT 39.7 04/25/2012   MCV 87.6 04/25/2012   PLT 209 04/25/2012     Recent Labs Lab  04/27/12 0840  NA 137  K 3.9  CL 104  CO2 27  BUN 12  CREATININE 0.86  CALCIUM 9.1  GLUCOSE 123*    Recent Labs  04/25/12 2134 04/26/12 0304 04/26/12 1212  TROPONINI <0.30 <0.30 <0.30    Recent Labs  04/25/12 1357  INR 1.02      Radiology: Nm Myocar Multi W/spect W/wall Motion / Ef 04/26/2012  *RADIOLOGY REPORT*  Clinical Data:  Chest pain  MYOCARDIAL IMAGING WITH SPECT (REST AND PHARMACOLOGIC-STRESS) GATED LEFT VENTRICULAR WALL MOTION STUDY LEFT VENTRICULAR EJECTION FRACTION  Technique:  Standard myocardial SPECT imaging was performed after resting intravenous injection of 10 mCi Tc-30m Sestamibi. Subsequently, intravenous infusion of regadenoson was performed under the supervision of the Cardiology staff.  At peak effect of the drug, 30 mCi Tc-82m sestamibi was injected intravenously and standard myocardial SPECT  imaging was performed.  Quantitative gated imaging was also performed to evaluate left ventricular wall motion, and estimate left ventricular ejection fraction.  Comparison:  None.  Findings: Planar images showed a small hot nodule on the right anterior chest wall.  This was seen well only with the stress images.  Gated images showed normal wall motion with EF 70%.  At rest, there was a small, mild mid anteroseptal perfusion defect .  With stress, there was a medium-sized, moderate basal to mid inferolateral and basal inferior perfusion defect and a small, mild to moderate mid anteroseptal perfusion defect.  IMPRESSION: 1. Small "hot nodule" anterior chest wall on the right,  seen best on stress planar images.  Would make sure that patient gets mammogram.  2. Normal EF (70%) and wall motion.  3. Partially reversible small anteroseptal perfusion defect may be due to soft tissue attenuation.  Medium sized, moderate basal to mid inferolateral and basal inferior reversible perfusion defect is concerning for ischemia.  4. Moderate risk study.   Original Report Authenticated By: Marca Ancona    Dg Chest Port 1 View 04/25/2012  *RADIOLOGY REPORT*  Clinical Data: Irregular heartbeat  PORTABLE CHEST - 1 VIEW  Comparison: 04/13/2007  Findings: The heart is mildly enlarged in size.  A small right- sided pleural effusion is noted.  The lungs are otherwise clear. Bony abnormality is seen.  IMPRESSION: Small right-sided pleural effusion.   Original Report Authenticated By: Alcide Clever, M.D.     Cardiac Cath: 04/27/2012 Left Main: Mildly calcified with no significant disease.  Left Anterior Descending (LAD): Significantly calcified especially in the proximal and mid segments. The stent is noted proximally with no significant restenosis. In the midsegment, there is diffuse 30-40% disease. The distal segment has minor irregularities.  1st diagonal (D1): Very small in size and diffusely diseased.  2nd diagonal (D2): Small in size with minor irregularities.  3rd diagonal (D3): Very small in size.  Circumflex (LCx): Normal in size and nondominant. The vessel is moderately calcified. In the midsegment, there appears to be a stent with diffuse 20% in-stent restenosis.  1st obtuse marginal: Small in size with minor irregularities.  2nd obtuse marginal: Medium in size with mild ostial disease.  3rd obtuse marginal: Normal in size with minor irregularities.  Right Coronary Artery: Normal in size and dominant. The vessel is occluded proximally. It fills via very good left to right collaterals. Left ventriculography: Left ventricular systolic function is normal , LVEF is estimated at 65 %, there is mild significant mitral regurgitation  Final Conclusions:  1. Underlying three-vessel coronary artery disease with occluded proximal right coronary artery with good left to right collaterals. This is new since the most recent cardiac catheterization. The stents in the LAD and left circumflex artery are patent with no significant restenosis.  2. Normal LV systolic function.  Recommendations:  Medical  therapy.    EKG: 16-Apr-2012 16:10:96 [facility] ROUTINE RECORD Sinus rhythm with frequent Premature ventricular complexes in a pattern of bigeminy Prolonged QT Abnormal ECG since last tracing no significant change 64mm/s 56mm/mV 100Hz  8.0.1 12SL 241 HD CID: 1 Referred by: Confirmed By: Susa Griffins MD Vent. rate 86 BPM PR interval 178 ms QRS duration 88 ms QT/QTc 412/493 ms P-R-T axes 65 59 89   FOLLOW UP PLANS AND APPOINTMENTS Allergies  Allergen Reactions  . Celecoxib   . Codeine   . Oxycodone Hcl   . Penicillins   . Sertraline Hcl      Medication List    TAKE these medications       amLODipine 10 MG tablet  Commonly known as:  NORVASC  Take 10 mg by mouth daily.     aspirin 81 MG tablet  Take 81 mg by mouth daily.     atorvastatin 20 MG tablet  Commonly known as:  LIPITOR  Take 20 mg by mouth daily.     CALCIUM 600 + D PO  Take 1 tablet by mouth daily. 600 mg/ 400 mg     clopidogrel 75 MG tablet  Commonly known as:  PLAVIX  Take 75 mg by mouth daily.     estrogens (conjugated)  0.3 MG tablet  Commonly known as:  PREMARIN  Take 0.3 mg by mouth daily. Take daily for 21 days then do not take for 7 days.     hydrochlorothiazide 25 MG tablet  Commonly known as:  HYDRODIURIL  Take 25 mg by mouth daily.     LORazepam 0.5 MG tablet  Commonly known as:  ATIVAN  Take 0.5 mg by mouth every 8 (eight) hours as needed for anxiety.     losartan 50 MG tablet  Commonly known as:  COZAAR  Take 50 mg by mouth daily.     metoprolol succinate 100 MG 24 hr tablet  Commonly known as:  TOPROL-XL  Take 1 tablet (100 mg total) by mouth daily. Take with or immediately following a meal.     multivitamin tablet  Take 1 tablet by mouth daily.     nitroGLYCERIN 0.4 MG SL tablet  Commonly known as:  NITROSTAT  Place 0.4 mg under the tongue every 5 (five) minutes as needed for chest pain.     potassium chloride SA 20 MEQ tablet  Commonly known  as:  K-DUR,KLOR-CON  Take 20 mEq by mouth 2 (two) times daily.        Discharge Orders   Future Appointments Provider Department Dept Phone   05/10/2012 2:20 PM [facility]m General Dg [facility] Room [facility] [place] [phone]   Patient should wear two piece clothing and wear no powder or deodorant. Patient should arrive 15 minutes early.   05/10/2012 2:30 PM [facility] [place] 1 [facility] [place]  IMAGING [phone]   Patient should wear two piece clothing and wear no powder or deodorant. Patient should arrive 15 minutes early.   05/30/2012 11:30 AM Antonieta Iba, MD [facility] [phone]   Future Orders Complete By Expires     Diet - low sodium heart healthy  As directed     Increase activity slowly  As directed       Follow-up Information   Follow up with Julien Nordmann, MD On 05/30/2012. (at 11:30 am)    Contact information:   [address] [place] [address] [phone] Fax [phone]       Follow up with Arnell Sieving, MD On 05/10/2012. (Mammogram and [place] at 2:00 pm for a 2:20 appt. )    Contact information:    At the [facility] [address] [place], [address] [place] [address]  [phone]        Schedule an appointment as soon as possible for a visit with Ginette Otto, MD.   Contact information:   [address] [place] [address] [phone]       BRING ALL MEDICATIONS WITH YOU TO FOLLOW UP APPOINTMENTS  Time spent with patient to include physician time: 43 min Signed: Theodore Demark, PA-C 04/28/2012, 12:09 PM Co-Sign MD

## 2012-04-30 NOTE — Discharge Summary (Signed)
The patient underwent repeat cath and has a subtotal occlusion of the RCA with good collaterals and preserved LV.  It could be approached percutaneously, and Dr. Kirke Corin did her cath and she will have follow up in [place].  She had a "hot spot" on her nuc scan.  FU Mammogram has been arranged and I spoke with Dr. Pete Glatter who is making arrangements for a fu CT scan as well, and will follow these as I am retiring and they are primary issues.  The CT is in light of her primary pulmonary nodule resection and determination of its metabolic findings.  The patient is aware of this.  For know, her bigeminal rhythm is approached by beta blockage.  Dr. Johney Frame provided the suggestions.

## 2012-05-10 ENCOUNTER — Ambulatory Visit
Admission: RE | Admit: 2012-05-10 | Discharge: 2012-05-10 | Disposition: A | Discharge status: Home / Self Care | Payer: Medicare Other | Source: Ambulatory Visit | Attending: Geriatric Medicine | Admitting: Geriatric Medicine

## 2012-05-10 ENCOUNTER — Ambulatory Visit: Admit: 2012-05-10 | Payer: Medicare Other

## 2012-05-10 ENCOUNTER — Other Ambulatory Visit (HOSPITAL_COMMUNITY): Payer: Self-pay | Admitting: Geriatric Medicine

## 2012-05-10 DIAGNOSIS — N6310 Unspecified lump in the right breast, unspecified quadrant: Secondary | ICD-10-CM

## 2012-05-10 DIAGNOSIS — R928 Other abnormal and inconclusive findings on diagnostic imaging of breast: Secondary | ICD-10-CM | POA: Diagnosis not present

## 2012-05-16 ENCOUNTER — Ambulatory Visit
Admission: RE | Admit: 2012-05-16 | Discharge: 2012-05-16 | Disposition: A | Discharge status: Home / Self Care | Payer: Medicare Other | Source: Ambulatory Visit | Attending: Geriatric Medicine | Admitting: Geriatric Medicine

## 2012-05-16 ENCOUNTER — Other Ambulatory Visit: Payer: Self-pay | Admitting: Geriatric Medicine

## 2012-05-16 DIAGNOSIS — R5381 Other malaise: Secondary | ICD-10-CM | POA: Diagnosis not present

## 2012-05-16 DIAGNOSIS — I2510 Atherosclerotic heart disease of native coronary artery without angina pectoris: Secondary | ICD-10-CM | POA: Diagnosis not present

## 2012-05-16 DIAGNOSIS — R911 Solitary pulmonary nodule: Secondary | ICD-10-CM

## 2012-05-16 DIAGNOSIS — R110 Nausea: Secondary | ICD-10-CM | POA: Diagnosis not present

## 2012-05-16 DIAGNOSIS — I10 Essential (primary) hypertension: Secondary | ICD-10-CM | POA: Diagnosis not present

## 2012-05-16 DIAGNOSIS — J90 Pleural effusion, not elsewhere classified: Secondary | ICD-10-CM | POA: Diagnosis not present

## 2012-05-16 DIAGNOSIS — Z79899 Other long term (current) drug therapy: Secondary | ICD-10-CM | POA: Diagnosis not present

## 2012-05-16 MED ORDER — IOHEXOL 300 MG/ML  SOLN
75.0000 mL | Freq: Once | INTRAMUSCULAR | Status: AC | PRN
  Administered 2012-05-16: 75 mL via INTRAVENOUS

## 2012-05-30 ENCOUNTER — Encounter: Payer: Self-pay | Admitting: Cardiovascular Disease

## 2012-05-30 ENCOUNTER — Ambulatory Visit (INDEPENDENT_AMBULATORY_CARE_PROVIDER_SITE_OTHER): Payer: Medicare Other | Admitting: Cardiovascular Disease

## 2012-05-30 VITALS — BP 114/62 | HR 70 | Ht 66.5 in | Wt 191.2 lb

## 2012-05-30 DIAGNOSIS — R29898 Other symptoms and signs involving the musculoskeletal system: Secondary | ICD-10-CM | POA: Diagnosis not present

## 2012-05-30 DIAGNOSIS — I6522 Occlusion and stenosis of left carotid artery: Secondary | ICD-10-CM

## 2012-05-30 DIAGNOSIS — R079 Chest pain, unspecified: Secondary | ICD-10-CM | POA: Diagnosis not present

## 2012-05-30 DIAGNOSIS — I6529 Occlusion and stenosis of unspecified carotid artery: Secondary | ICD-10-CM

## 2012-05-30 DIAGNOSIS — I2510 Atherosclerotic heart disease of native coronary artery without angina pectoris: Secondary | ICD-10-CM | POA: Diagnosis not present

## 2012-05-30 DIAGNOSIS — E7800 Pure hypercholesterolemia, unspecified: Secondary | ICD-10-CM

## 2012-05-30 NOTE — Progress Notes (Signed)
Patient ID: [name], female    DOB: April 19, 1930, 79 y.o.   MRN: [id number]  HPI Comments: 79 yo woman with CAD, prior PCI, chronic chest pain episodes, recent cardiac catheterization April 2014 showing occluded RCA, patent LAD and left circumflex and presents for followup after recent cardiac catheterization . She has 50% carotid disease on the left.  Lab work checked by primary care physician in  She reports having rare sharp chest pain. Symptoms last for less than one second at a time. Not associated with exertion . She had several weeks of weakness and malaise. Did not feel that she is going to make it. Had a cardiac catheterization that showed patent LAD and circumflex stenting, occluded RCA. Now feels better. Metoprolol was increased to 100 mg daily for PVCs .  echocardiogram in 2012 demonstrated well-preserved left ventricular function, and trace AI.   3 vessel PTCA at [facility] in 1986 and then had a stent placed several years ago  (non DES).   Chronic leg fatigue.  She is able to walk without too much difficulty, but can't go very far because of leg fatigue.  No leg pain.    EKG today shows normal sinus rhythm with rate 70 beats per minute, PVCs in a bigeminal pattern   Past Surgical History . Pci  1986 . Carcinoid     removed from lung, [facility] . Pic of lad  2001   with non des lad  . Pci of mid cfx     with non des and cutting balloon pci of av circumflex      Outpatient Encounter Prescriptions as of 05/30/2012  Medication Sig Dispense Refill  . amLODipine (NORVASC) 10 MG tablet Take 10 mg by mouth daily.      Marland Kitchen aspirin 81 MG tablet Take 81 mg by mouth daily.      Marland Kitchen atorvastatin (LIPITOR) 20 MG tablet Take 20 mg by mouth daily.      . Calcium Carbonate-Vitamin D (CALCIUM 600 + D PO) Take 1 tablet by mouth daily. 600 mg/ 400 mg      . clopidogrel (PLAVIX) 75 MG tablet Take 75 mg by mouth daily.      Marland Kitchen estrogens, conjugated, (PREMARIN) 0.3 MG tablet Take 0.3 mg by mouth  daily. Take daily for 21 days then do not take for 7 days.      . hydrochlorothiazide (HYDRODIURIL) 25 MG tablet Take 25 mg by mouth daily.      Marland Kitchen LORazepam (ATIVAN) 0.5 MG tablet Take 0.5 mg by mouth every 8 (eight) hours as needed for anxiety.       Marland Kitchen losartan (COZAAR) 50 MG tablet Take 50 mg by mouth daily.      . metoprolol succinate (TOPROL-XL) 100 MG 24 hr tablet Take 1 tablet (100 mg total) by mouth daily. Take with or immediately following a meal.  30 tablet  11  . Multiple Vitamin (MULTIVITAMIN) tablet Take 1 tablet by mouth daily.      . nitroGLYCERIN (NITROSTAT) 0.4 MG SL tablet Place 0.4 mg under the tongue every 5 (five) minutes as needed for chest pain.       . potassium chloride SA (K-DUR,KLOR-CON) 20 MEQ tablet Take 20 mEq by mouth 2 (two) times daily.         Review of Systems  Constitutional: Positive for fatigue.  HENT: Negative.   Eyes: Negative.   Respiratory: Negative.   Cardiovascular: Positive for chest pain.  Atypical chest pain  Gastrointestinal: Negative.   Musculoskeletal: Negative.   Skin: Negative.   Neurological: Negative.   Psychiatric/Behavioral: Negative.   All other systems reviewed and are negative.    BP 114/62  Pulse 70  Ht 5' 6.5" (1.689 m)  Wt 191 lb 4 oz (86.75 kg)  BMI 30.41 kg/m2  Physical Exam  Nursing note and vitals reviewed. Constitutional: She is oriented to person, place, and time. She appears well-developed and well-nourished.  HENT:  Head: Normocephalic.  Nose: Nose normal.  Mouth/Throat: Oropharynx is clear and moist.  Eyes: Conjunctivae are normal. Pupils are equal, round, and reactive to light.  Neck: Normal range of motion. Neck supple. No JVD present.  Cardiovascular: Normal rate, regular rhythm, S1 normal, S2 normal, normal heart sounds and intact distal pulses.  Exam reveals no gallop and no friction rub.   No murmur heard. Pulmonary/Chest: Effort normal and breath sounds normal. No respiratory distress. She  has no wheezes. She has no rales. She exhibits no tenderness.  Abdominal: Soft. Bowel sounds are normal. She exhibits no distension. There is no tenderness.  Musculoskeletal: Normal range of motion. She exhibits no edema and no tenderness.  Lymphadenopathy:    She has no cervical adenopathy.  Neurological: She is alert and oriented to person, place, and time. Coordination normal.  Skin: Skin is warm and dry. No rash noted. No erythema.  Psychiatric: She has a normal mood and affect. Her behavior is normal. Judgment and thought content normal.    Assessment and Plan

## 2012-05-30 NOTE — Assessment & Plan Note (Signed)
Recent cardiac catheterization, occluded RCA, patent LAD and left circumflex. Continue current medications

## 2012-05-30 NOTE — Assessment & Plan Note (Signed)
Goal LDL less than 70, total cholesterol less than 150 most recent lab work not available.

## 2012-05-30 NOTE — Patient Instructions (Addendum)
You are doing well. No medication changes were made.  Please call us if you have new issues that need to be addressed before your next appt.  Your physician wants you to follow-up in: 12 months.  You will receive a reminder letter in the mail two months in advance. If you don't receive a letter, please call our office to schedule the follow-up appointment. 

## 2012-05-30 NOTE — Assessment & Plan Note (Signed)
Encouraged her to continue her exercise

## 2012-05-30 NOTE — Assessment & Plan Note (Signed)
50% left carotid arterial disease on recent ultrasound

## 2012-06-13 ENCOUNTER — Other Ambulatory Visit: Payer: Self-pay

## 2012-06-13 ENCOUNTER — Telehealth: Payer: Self-pay

## 2012-06-13 NOTE — Telephone Encounter (Signed)
Pt called with c/o new onset DOE Says she has been having this x a "few days" Says she is sob even with walking short distances and has to stop to catch her breath Worse when bending over Feels "bloated all the time" Denies CP but admits to feeling "bloated in my chest" Edema has improved in LE Confirms compliance with medications as prescribed Was recently advised to increase metoprolol to 100 mg QD at hospital d/c s/p cath She does not monitor BP/HR at home so we do not know these readings She says prior to coronary stents in past, her main symptoms was CP, which she denies at this time Main concern for pt is new DOE that is getting worse While speaking to me on the phone she was audible SOB and seemed to be in no acute distress  I told her I would discuss with Dr. Mariah Milling when he came in to office this am and will call her back.  Understanding verb

## 2012-06-13 NOTE — Telephone Encounter (Signed)
Pt informed Echo scheduled for 06/16/12 first availability She states she wants to "know what's wrong with me before then". I offered sooner appt with another provider but she declines since she thinks they will "just order the echo" I advised, per Dr. Mariah Milling, ok to try taking an extra 1/2 tablet HCTZ x 2 days to see if this helps symptoms She will try this and I will call her back 06/15/12 to reassess She understands to call us/go to ER should symptoms get worse prior to appt

## 2012-06-13 NOTE — Telephone Encounter (Signed)
I discussed with Dr. Mariah Milling who suggests we order echocardiogram for pt.

## 2012-06-13 NOTE — Telephone Encounter (Signed)
Pt called back, says son is very upset, felt pt should have been seen today, would like to know if echo could be done sooner at [facility] I had her hold while I scheduled this appt made for tomm at 1100 at [facility] I explained Dr. Mariah Milling did not have any openings for today and needs pt to have echo done to get info he needs to see what is going on Again, I advised her to try taking an extra 1/2 tablet HCTZ, per Dr. Windell Hummingbird suggestion, to see if this helps Understanding verb Will await echo results from tomm

## 2012-06-14 ENCOUNTER — Ambulatory Visit: Payer: Self-pay | Admitting: Cardiovascular Disease

## 2012-06-14 ENCOUNTER — Telehealth: Payer: Self-pay

## 2012-06-14 DIAGNOSIS — R0602 Shortness of breath: Secondary | ICD-10-CM | POA: Diagnosis not present

## 2012-06-14 DIAGNOSIS — I059 Rheumatic mitral valve disease, unspecified: Secondary | ICD-10-CM

## 2012-06-14 NOTE — Telephone Encounter (Signed)
Get echo results from [facility]

## 2012-06-15 ENCOUNTER — Other Ambulatory Visit: Payer: Self-pay

## 2012-06-15 DIAGNOSIS — I498 Other specified cardiac arrhythmias: Secondary | ICD-10-CM | POA: Diagnosis not present

## 2012-06-15 DIAGNOSIS — R0609 Other forms of dyspnea: Secondary | ICD-10-CM

## 2012-06-15 DIAGNOSIS — I10 Essential (primary) hypertension: Secondary | ICD-10-CM | POA: Diagnosis not present

## 2012-06-15 DIAGNOSIS — R0602 Shortness of breath: Secondary | ICD-10-CM | POA: Diagnosis not present

## 2012-06-15 DIAGNOSIS — R609 Edema, unspecified: Secondary | ICD-10-CM | POA: Diagnosis not present

## 2012-06-15 NOTE — Telephone Encounter (Signed)
I called pt back who says her son can bring her to appt I had her hold while I called Dr. Laverle Hobby office again, explaining importance of situating and the need for an appt today She transferred me to [facility] VM I left VM for CMA to call me back ASAP [id number] for pt  I told pt I would give CMA 1-1 1/2 hours to call me back then I would call them back and let pt know what I find out Understanding verb

## 2012-06-15 NOTE — Telephone Encounter (Signed)
I rec'd t/c from pt's PCP/nurse They tell me they are calling pt now to have her come on in to their office I will call pt tomm to evaluate

## 2012-06-15 NOTE — Telephone Encounter (Signed)
Echo results reviewed and sent to Dr. Mariah Milling for review

## 2012-06-15 NOTE — Telephone Encounter (Signed)
I called to assess pt She says she feels the "same" as she did 2 days ago Tried taking 1 1/2 tabs HCTZ but this made no diff in symptoms Continues to feel fatigued and has worsening dyspnea I explained echo results are back but Dr. Mariah Milling has to review  Pt states, "I think I have an infection of some type"; upon questioning further she denies symptoms of UTI but is describing symptoms of "rattling" when I take a deep breath; unsure if she has a fever. She does not check this I offered her an appt with Dr. Elease Hashimoto this PM but she declines, stating, "I think I need to see my medical doctor" I told her I would call for her to make appt for today She tells me she does not have a ride; grandson is in school and son is working in [employer]. I asked dif she would be opposed to getting a cab to drive her. She declines this option. She states our office is closer for her since she lives in [place] and PCP is in [place], but she still wants to see PCP  I told her I would call now to get her an appt; she is going to call her son and see if he would be available to take her today. In will call her back

## 2012-06-15 NOTE — Telephone Encounter (Signed)
I called Dr. Laverle Hobby office who tells me they have no openings today or tomm but says if I call back tomm they can put pt in an "emergency" slot. I asked why I cant do this today in an "emergency slot". I was told these are all full now. I will call pt to inform

## 2012-06-16 ENCOUNTER — Other Ambulatory Visit: Payer: Medicare Other

## 2012-06-17 ENCOUNTER — Telehealth: Payer: Self-pay

## 2012-06-17 MED ORDER — METOPROLOL SUCCINATE ER 100 MG PO TB24: 50.0000 mg | ORAL_TABLET | Freq: Every day | ORAL | Status: DC

## 2012-06-17 NOTE — Telephone Encounter (Signed)
I called to assess pt's symptoms and see if she was able to go to PCP as discussed 6/4 She confirms she went to Dr. Pete Glatter 6/4 He stopped her HCTZ and started her on lasix daily He also decreased metopprolol from 100 mg to 50 mg daily since HR was 42 BPM  Pt reports she is feeling "much better" and has f/u with PCP next week to re evaluate I to9ld her I would make Dr. Mariah Milling aware

## 2012-06-22 DIAGNOSIS — I2510 Atherosclerotic heart disease of native coronary artery without angina pectoris: Secondary | ICD-10-CM | POA: Diagnosis not present

## 2012-06-22 DIAGNOSIS — I10 Essential (primary) hypertension: Secondary | ICD-10-CM | POA: Diagnosis not present

## 2012-06-22 DIAGNOSIS — Z79899 Other long term (current) drug therapy: Secondary | ICD-10-CM | POA: Diagnosis not present

## 2012-07-08 ENCOUNTER — Other Ambulatory Visit: Payer: Self-pay | Admitting: Cardiology

## 2012-07-11 ENCOUNTER — Other Ambulatory Visit: Payer: Self-pay | Admitting: Cardiology

## 2012-07-19 DIAGNOSIS — N390 Urinary tract infection, site not specified: Secondary | ICD-10-CM | POA: Diagnosis not present

## 2012-08-09 DIAGNOSIS — N390 Urinary tract infection, site not specified: Secondary | ICD-10-CM | POA: Diagnosis not present

## 2012-08-21 ENCOUNTER — Emergency Department: Payer: Self-pay | Admitting: Internal Medicine

## 2012-08-21 DIAGNOSIS — Z79899 Other long term (current) drug therapy: Secondary | ICD-10-CM | POA: Diagnosis not present

## 2012-08-21 DIAGNOSIS — Z794 Long term (current) use of insulin: Secondary | ICD-10-CM | POA: Diagnosis not present

## 2012-08-21 DIAGNOSIS — R197 Diarrhea, unspecified: Secondary | ICD-10-CM | POA: Diagnosis not present

## 2012-08-21 DIAGNOSIS — R42 Dizziness and giddiness: Secondary | ICD-10-CM | POA: Diagnosis not present

## 2012-08-21 DIAGNOSIS — R0602 Shortness of breath: Secondary | ICD-10-CM | POA: Diagnosis not present

## 2012-08-21 DIAGNOSIS — R109 Unspecified abdominal pain: Secondary | ICD-10-CM | POA: Diagnosis not present

## 2012-08-21 DIAGNOSIS — E7800 Pure hypercholesterolemia, unspecified: Secondary | ICD-10-CM | POA: Diagnosis not present

## 2012-08-21 DIAGNOSIS — R55 Syncope and collapse: Secondary | ICD-10-CM | POA: Diagnosis not present

## 2012-08-21 DIAGNOSIS — R110 Nausea: Secondary | ICD-10-CM | POA: Diagnosis not present

## 2012-08-21 LAB — COMPREHENSIVE METABOLIC PANEL
BUN: 14 mg/dL (ref 7–18)
Bilirubin,Total: 1.4 mg/dL — ABNORMAL HIGH (ref 0.2–1.0)
Calcium, Total: 9.3 mg/dL (ref 8.5–10.1)
Co2: 27 mmol/L (ref 21–32)
EGFR (African American): >60
EGFR (Non-African Amer.): >60
Glucose: 170 mg/dL — ABNORMAL HIGH (ref 65–99)
Potassium: 3.5 mmol/L (ref 3.5–5.1)
SGPT (ALT): 30 U/L (ref 12–78)
Total Protein: 7.2 g/dL (ref 6.4–8.2)

## 2012-08-21 LAB — URINALYSIS, COMPLETE
Bilirubin,UR: NEGATIVE
Blood: NEGATIVE
Glucose,UR: NEGATIVE mg/dL (ref 0–75)
Hyaline Cast: 3
Ketone: NEGATIVE
Leukocyte Esterase: NEGATIVE
Nitrite: NEGATIVE
Ph: 7 (ref 4.5–8.0)
Protein: NEGATIVE
RBC,UR: NONE SEEN /HPF (ref 0–5)
Specific Gravity: 1.006 (ref 1.003–1.030)
Squamous Epithelial: 2
WBC UR: 1 /HPF (ref 0–5)

## 2012-08-21 LAB — CBC
MCH: 30.6 pg (ref 26.0–34.0)
MCV: 87 fL (ref 80–100)
RDW: 14.5 % (ref 11.5–14.5)

## 2012-10-19 DIAGNOSIS — I10 Essential (primary) hypertension: Secondary | ICD-10-CM | POA: Diagnosis not present

## 2012-10-19 DIAGNOSIS — Z23 Encounter for immunization: Secondary | ICD-10-CM | POA: Diagnosis not present

## 2013-02-22 DIAGNOSIS — Z23 Encounter for immunization: Secondary | ICD-10-CM | POA: Diagnosis not present

## 2013-02-22 DIAGNOSIS — I10 Essential (primary) hypertension: Secondary | ICD-10-CM | POA: Diagnosis not present

## 2013-02-22 DIAGNOSIS — Z79899 Other long term (current) drug therapy: Secondary | ICD-10-CM | POA: Diagnosis not present

## 2013-02-22 DIAGNOSIS — Z1331 Encounter for screening for depression: Secondary | ICD-10-CM | POA: Diagnosis not present

## 2013-02-22 DIAGNOSIS — E782 Mixed hyperlipidemia: Secondary | ICD-10-CM | POA: Diagnosis not present

## 2013-02-22 DIAGNOSIS — I6529 Occlusion and stenosis of unspecified carotid artery: Secondary | ICD-10-CM | POA: Diagnosis not present

## 2013-02-22 DIAGNOSIS — Z0000 Encounter for general adult medical examination without abnormal findings: Secondary | ICD-10-CM | POA: Diagnosis not present

## 2013-03-06 DIAGNOSIS — H26019 Infantile and juvenile cortical, lamellar, or zonular cataract, unspecified eye: Secondary | ICD-10-CM | POA: Diagnosis not present

## 2013-03-06 DIAGNOSIS — H2510 Age-related nuclear cataract, unspecified eye: Secondary | ICD-10-CM | POA: Diagnosis not present

## 2013-03-06 DIAGNOSIS — Z961 Presence of intraocular lens: Secondary | ICD-10-CM | POA: Diagnosis not present

## 2013-03-06 DIAGNOSIS — H353190 Nonexudative age-related macular degeneration, unspecified eye, stage unspecified: Secondary | ICD-10-CM | POA: Diagnosis not present

## 2013-03-06 DIAGNOSIS — H4320 Crystalline deposits in vitreous body, unspecified eye: Secondary | ICD-10-CM | POA: Diagnosis not present

## 2013-04-06 DIAGNOSIS — H353190 Nonexudative age-related macular degeneration, unspecified eye, stage unspecified: Secondary | ICD-10-CM | POA: Diagnosis not present

## 2013-04-06 DIAGNOSIS — H35369 Drusen (degenerative) of macula, unspecified eye: Secondary | ICD-10-CM | POA: Diagnosis not present

## 2013-04-06 DIAGNOSIS — H43819 Vitreous degeneration, unspecified eye: Secondary | ICD-10-CM | POA: Diagnosis not present

## 2013-04-06 DIAGNOSIS — H353290 Exudative age-related macular degeneration, unspecified eye, stage unspecified: Secondary | ICD-10-CM | POA: Diagnosis not present

## 2013-04-13 DIAGNOSIS — H353290 Exudative age-related macular degeneration, unspecified eye, stage unspecified: Secondary | ICD-10-CM | POA: Diagnosis not present

## 2013-04-19 ENCOUNTER — Other Ambulatory Visit (HOSPITAL_COMMUNITY): Payer: Self-pay | Admitting: *Deleted

## 2013-04-19 DIAGNOSIS — I6529 Occlusion and stenosis of unspecified carotid artery: Secondary | ICD-10-CM

## 2013-05-08 ENCOUNTER — Other Ambulatory Visit: Payer: Self-pay | Admitting: *Deleted

## 2013-05-08 MED ORDER — POTASSIUM CHLORIDE CRYS ER 20 MEQ PO TBCR: EXTENDED_RELEASE_TABLET | ORAL | Status: DC

## 2013-05-08 NOTE — Telephone Encounter (Signed)
Requested Prescriptions   Signed Prescriptions Disp Refills  . potassium chloride SA (KLOR-CON M20) 20 MEQ tablet 60 tablet 3    Sig: TAKE 1 TABLET TWICE DAILY    Authorizing Provider: Minna Merritts    Ordering User: LOPEZ, MARINA C  . potassium chloride SA (KLOR-CON M20) 20 MEQ tablet 180 tablet 3    Sig: TAKE 1 TABLET TWICE DAILY    Authorizing Provider: Minna Merritts    Ordering User: Britt Bottom

## 2013-05-09 ENCOUNTER — Other Ambulatory Visit: Payer: Self-pay | Admitting: *Deleted

## 2013-05-09 MED ORDER — POTASSIUM CHLORIDE CRYS ER 20 MEQ PO TBCR: EXTENDED_RELEASE_TABLET | ORAL | Status: DC

## 2013-05-09 NOTE — Telephone Encounter (Signed)
Requested Prescriptions   Signed Prescriptions Disp Refills  . potassium chloride SA (KLOR-CON M20) 20 MEQ tablet 180 tablet 1    Sig: TAKE 1 TABLET TWICE DAILY    Authorizing Provider: Minna Merritts    Ordering User: Britt Bottom

## 2013-05-11 DIAGNOSIS — H35059 Retinal neovascularization, unspecified, unspecified eye: Secondary | ICD-10-CM | POA: Diagnosis not present

## 2013-05-11 DIAGNOSIS — H353290 Exudative age-related macular degeneration, unspecified eye, stage unspecified: Secondary | ICD-10-CM | POA: Diagnosis not present

## 2013-05-18 DIAGNOSIS — H905 Unspecified sensorineural hearing loss: Secondary | ICD-10-CM | POA: Diagnosis not present

## 2013-06-01 ENCOUNTER — Encounter: Payer: Self-pay | Admitting: Cardiovascular Disease

## 2013-06-01 ENCOUNTER — Ambulatory Visit (INDEPENDENT_AMBULATORY_CARE_PROVIDER_SITE_OTHER): Payer: Medicare Other | Admitting: Cardiovascular Disease

## 2013-06-01 ENCOUNTER — Encounter (INDEPENDENT_AMBULATORY_CARE_PROVIDER_SITE_OTHER): Payer: Medicare Other

## 2013-06-01 VITALS — BP 152/80 | HR 68 | Ht 66.0 in | Wt 190.0 lb

## 2013-06-01 DIAGNOSIS — I6529 Occlusion and stenosis of unspecified carotid artery: Secondary | ICD-10-CM

## 2013-06-01 DIAGNOSIS — I2510 Atherosclerotic heart disease of native coronary artery without angina pectoris: Secondary | ICD-10-CM

## 2013-06-01 DIAGNOSIS — I493 Ventricular premature depolarization: Secondary | ICD-10-CM

## 2013-06-01 DIAGNOSIS — R29898 Other symptoms and signs involving the musculoskeletal system: Secondary | ICD-10-CM

## 2013-06-01 DIAGNOSIS — I10 Essential (primary) hypertension: Secondary | ICD-10-CM | POA: Diagnosis not present

## 2013-06-01 DIAGNOSIS — I4949 Other premature depolarization: Secondary | ICD-10-CM | POA: Diagnosis not present

## 2013-06-01 DIAGNOSIS — I6522 Occlusion and stenosis of left carotid artery: Secondary | ICD-10-CM

## 2013-06-01 DIAGNOSIS — E7800 Pure hypercholesterolemia, unspecified: Secondary | ICD-10-CM

## 2013-06-01 MED ORDER — LOSARTAN POTASSIUM 100 MG PO TABS: 100.0000 mg | ORAL_TABLET | Freq: Every day | ORAL | Status: DC

## 2013-06-01 NOTE — Assessment & Plan Note (Addendum)
She will hold her Lipitor for leg symptoms for a short trial.. If symptoms in her legs to improve without the Lipitor, we will try an alternate statin

## 2013-06-01 NOTE — Assessment & Plan Note (Signed)
Blood pressures running high. We have suggested she increase her losartan up to 100 mg daily. Also mentioned that she may benefit from buying a blood pressure cuff to evaluate her blood pressure at home

## 2013-06-01 NOTE — Progress Notes (Signed)
Patient ID: [name], female    DOB: 1930/09/13, 80 y.o.   MRN: [id number]  HPI Comments: 80 yo woman with CAD, prior Pic of lad in 2001 with non des, Pci of mid LCX with non des and cutting balloon, chronic chest pain episodes,  cardiac catheterization April 2014 showing occluded RCA, patent LAD and left circumflex and presents for followup . She has 50% carotid disease on the left.   In followup today, she reports that her legs have been very weak. She has a 40 walking very far. Lipitor dose was decreased down from 20 mg to 10 mg by primary care. With this dose change, symptoms are about the same. Cholesterol on the 20 mg dosing was 125, LDL 47, HDL 51  She does not do any regular exercise. Denies any recent falls. No significant chest pain, shortness of breath or palpitations We had a long discussion about her prior cardiac catheterization and her collateral circulation from the left to the right  Metoprolol was increased in the past to 100 mg daily for PVCs .  echocardiogram in 2012 demonstrated well-preserved left ventricular function, and trace AI.   3 vessel PTCA at [facility] in 1986 and then had a stent placed several years ago  (non DES).    EKG today shows normal sinus rhythm with rate 68 beats per minute, PVCs in a trigeminal pattern    Outpatient Encounter Prescriptions as of 06/01/2013  Medication Sig  . amLODipine (NORVASC) 10 MG tablet Take 10 mg by mouth daily.  Marland Kitchen aspirin 81 MG tablet Take 81 mg by mouth daily.  Marland Kitchen atorvastatin (LIPITOR) 20 MG tablet Take 10 mg by mouth daily.   . Calcium Carbonate-Vitamin D (CALCIUM 600 + D PO) Take 1 tablet by mouth daily. 600 mg/ 400 mg  . clopidogrel (PLAVIX) 75 MG tablet Take 75 mg by mouth daily.  Marland Kitchen estrogens, conjugated, (PREMARIN) 0.3 MG tablet Take 0.3 mg by mouth daily. Take daily for 21 days then do not take for 7 days.  . furosemide (LASIX) 20 MG tablet Take 20 mg by mouth daily.  Marland Kitchen KLOR-CON M20 20 MEQ tablet TAKE 1 TABLET TWICE  DAILY  . LORazepam (ATIVAN) 0.5 MG tablet Take 0.5 mg by mouth every 8 (eight) hours as needed for anxiety.   Marland Kitchen losartan (COZAAR) 100 MG tablet Take 1 tablet (100 mg total) by mouth daily.  . metoprolol succinate (TOPROL-XL) 100 MG 24 hr tablet Take 1 tablet (100 mg total) by mouth daily. Take with or immediately following a meal.  . Multiple Vitamin (MULTIVITAMIN) tablet Take 1 tablet by mouth daily.  . nitroGLYCERIN (NITROSTAT) 0.4 MG SL tablet Place 0.4 mg under the tongue every 5 (five) minutes as needed for chest pain.   . [DISCONTINUED] losartan (COZAAR) 50 MG tablet Take 50 mg by mouth daily.  . [DISCONTINUED] potassium chloride SA (KLOR-CON M20) 20 MEQ tablet TAKE 1 TABLET TWICE DAILY  . [DISCONTINUED] potassium chloride SA (KLOR-CON M20) 20 MEQ tablet TAKE 1 TABLET TWICE DAILY      Review of Systems  Constitutional: Positive for fatigue.  HENT: Negative.   Eyes: Negative.   Respiratory: Negative.   Gastrointestinal: Negative.   Endocrine: Negative.   Musculoskeletal: Positive for arthralgias.       Chronic leg pain  Skin: Negative.   Allergic/Immunologic: Negative.   Neurological: Negative.   Hematological: Negative.   Psychiatric/Behavioral: Negative.   All other systems reviewed and are negative.   BP 152/80  Pulse 68  Ht 5\' 6"  (1.676 m)  Wt 190 lb (86.183 kg)  BMI 30.68 kg/m2  Physical Exam  Nursing note and vitals reviewed. Constitutional: She is oriented to person, place, and time. She appears well-developed and well-nourished.  HENT:  Head: Normocephalic.  Nose: Nose normal.  Mouth/Throat: Oropharynx is clear and moist.  Eyes: Conjunctivae are normal. Pupils are equal, round, and reactive to light.  Neck: Normal range of motion. Neck supple. No JVD present. Carotid bruit is present.  Cardiovascular: Normal rate, regular rhythm, S1 normal, S2 normal, normal heart sounds and intact distal pulses.  Exam reveals no gallop and no friction rub.   No murmur  heard. Pulmonary/Chest: Effort normal and breath sounds normal. No respiratory distress. She has no wheezes. She has no rales. She exhibits no tenderness.  Abdominal: Soft. Bowel sounds are normal. She exhibits no distension. There is no tenderness.  Musculoskeletal: Normal range of motion. She exhibits no edema and no tenderness.  Lymphadenopathy:    She has no cervical adenopathy.  Neurological: She is alert and oriented to person, place, and time. Coordination normal.  Skin: Skin is warm and dry. No rash noted. No erythema.  Psychiatric: She has a normal mood and affect. Her behavior is normal. Judgment and thought content normal.    Assessment and Plan

## 2013-06-01 NOTE — Assessment & Plan Note (Signed)
She will hold her Lipitor for 2 weeks, 10 mg. If leg symptoms do not improve, she will restart her Lipitor

## 2013-06-01 NOTE — Assessment & Plan Note (Signed)
Currently with no symptoms of angina. No further workup at this time. Continue current medication regimen. 

## 2013-06-01 NOTE — Assessment & Plan Note (Signed)
Asymptomatic PVCs. Continue her metoprolol. No further testing at this time

## 2013-06-01 NOTE — Assessment & Plan Note (Signed)
Bruit auscultated on exam. Carotid ultrasound scheduled for today

## 2013-06-01 NOTE — Patient Instructions (Addendum)
Do a trial without the atorvastatin/lipitor for two weeks to see if your legs get better If no improvement in your symptoms, go back on the medication  Please increase the losartan up to 100 mg once a day Please monitor your blood pressure at home  If legs a better, call the office  Please call us if you have new issues that need to be addressed before your next appt.  Your physician wants you to follow-up in: 6 months.  You will receive a reminder letter in the mail two months in advance. If you don't receive a letter, please call our office to schedule the follow-up appointment.

## 2013-06-23 DIAGNOSIS — H43819 Vitreous degeneration, unspecified eye: Secondary | ICD-10-CM | POA: Diagnosis not present

## 2013-06-23 DIAGNOSIS — H35369 Drusen (degenerative) of macula, unspecified eye: Secondary | ICD-10-CM | POA: Diagnosis not present

## 2013-06-23 DIAGNOSIS — H353190 Nonexudative age-related macular degeneration, unspecified eye, stage unspecified: Secondary | ICD-10-CM | POA: Diagnosis not present

## 2013-06-23 DIAGNOSIS — H353290 Exudative age-related macular degeneration, unspecified eye, stage unspecified: Secondary | ICD-10-CM | POA: Diagnosis not present

## 2013-06-27 ENCOUNTER — Other Ambulatory Visit: Payer: Self-pay

## 2013-06-27 DIAGNOSIS — I2510 Atherosclerotic heart disease of native coronary artery without angina pectoris: Secondary | ICD-10-CM

## 2013-07-24 DIAGNOSIS — M25569 Pain in unspecified knee: Secondary | ICD-10-CM | POA: Diagnosis not present

## 2013-07-25 DIAGNOSIS — M25569 Pain in unspecified knee: Secondary | ICD-10-CM | POA: Diagnosis not present

## 2013-08-07 DIAGNOSIS — H2510 Age-related nuclear cataract, unspecified eye: Secondary | ICD-10-CM | POA: Diagnosis not present

## 2013-08-07 DIAGNOSIS — H26019 Infantile and juvenile cortical, lamellar, or zonular cataract, unspecified eye: Secondary | ICD-10-CM | POA: Diagnosis not present

## 2013-08-16 DIAGNOSIS — H26019 Infantile and juvenile cortical, lamellar, or zonular cataract, unspecified eye: Secondary | ICD-10-CM | POA: Diagnosis not present

## 2013-08-16 DIAGNOSIS — H2510 Age-related nuclear cataract, unspecified eye: Secondary | ICD-10-CM | POA: Diagnosis not present

## 2013-08-23 DIAGNOSIS — I10 Essential (primary) hypertension: Secondary | ICD-10-CM | POA: Diagnosis not present

## 2013-09-14 DIAGNOSIS — H353290 Exudative age-related macular degeneration, unspecified eye, stage unspecified: Secondary | ICD-10-CM | POA: Diagnosis not present

## 2013-09-14 DIAGNOSIS — E1139 Type 2 diabetes mellitus with other diabetic ophthalmic complication: Secondary | ICD-10-CM | POA: Diagnosis not present

## 2013-09-14 DIAGNOSIS — E11311 Type 2 diabetes mellitus with unspecified diabetic retinopathy with macular edema: Secondary | ICD-10-CM | POA: Diagnosis not present

## 2013-09-14 DIAGNOSIS — E11339 Type 2 diabetes mellitus with moderate nonproliferative diabetic retinopathy without macular edema: Secondary | ICD-10-CM | POA: Diagnosis not present

## 2013-11-03 ENCOUNTER — Observation Stay: Payer: Self-pay | Admitting: Internal Medicine

## 2013-11-03 ENCOUNTER — Telehealth: Payer: Self-pay

## 2013-11-03 DIAGNOSIS — Z885 Allergy status to narcotic agent status: Secondary | ICD-10-CM | POA: Diagnosis not present

## 2013-11-03 DIAGNOSIS — R0602 Shortness of breath: Secondary | ICD-10-CM | POA: Diagnosis not present

## 2013-11-03 DIAGNOSIS — I358 Other nonrheumatic aortic valve disorders: Secondary | ICD-10-CM | POA: Diagnosis not present

## 2013-11-03 DIAGNOSIS — I252 Old myocardial infarction: Secondary | ICD-10-CM | POA: Diagnosis not present

## 2013-11-03 DIAGNOSIS — R0789 Other chest pain: Secondary | ICD-10-CM | POA: Diagnosis not present

## 2013-11-03 DIAGNOSIS — Z9049 Acquired absence of other specified parts of digestive tract: Secondary | ICD-10-CM | POA: Diagnosis not present

## 2013-11-03 DIAGNOSIS — Z955 Presence of coronary angioplasty implant and graft: Secondary | ICD-10-CM | POA: Diagnosis not present

## 2013-11-03 DIAGNOSIS — I10 Essential (primary) hypertension: Secondary | ICD-10-CM | POA: Diagnosis not present

## 2013-11-03 DIAGNOSIS — Z7982 Long term (current) use of aspirin: Secondary | ICD-10-CM | POA: Diagnosis not present

## 2013-11-03 DIAGNOSIS — I501 Left ventricular failure: Secondary | ICD-10-CM | POA: Diagnosis not present

## 2013-11-03 DIAGNOSIS — I2510 Atherosclerotic heart disease of native coronary artery without angina pectoris: Secondary | ICD-10-CM | POA: Diagnosis not present

## 2013-11-03 DIAGNOSIS — I5033 Acute on chronic diastolic (congestive) heart failure: Secondary | ICD-10-CM

## 2013-11-03 DIAGNOSIS — R079 Chest pain, unspecified: Secondary | ICD-10-CM

## 2013-11-03 DIAGNOSIS — Z880 Allergy status to penicillin: Secondary | ICD-10-CM | POA: Diagnosis not present

## 2013-11-03 DIAGNOSIS — Z888 Allergy status to other drugs, medicaments and biological substances status: Secondary | ICD-10-CM | POA: Diagnosis not present

## 2013-11-03 DIAGNOSIS — Z90710 Acquired absence of both cervix and uterus: Secondary | ICD-10-CM | POA: Diagnosis not present

## 2013-11-03 DIAGNOSIS — I071 Rheumatic tricuspid insufficiency: Secondary | ICD-10-CM | POA: Diagnosis not present

## 2013-11-03 DIAGNOSIS — Z9114 Patient's other noncompliance with medication regimen: Secondary | ICD-10-CM | POA: Diagnosis not present

## 2013-11-03 DIAGNOSIS — E785 Hyperlipidemia, unspecified: Secondary | ICD-10-CM | POA: Diagnosis not present

## 2013-11-03 DIAGNOSIS — Z85118 Personal history of other malignant neoplasm of bronchus and lung: Secondary | ICD-10-CM | POA: Diagnosis not present

## 2013-11-03 DIAGNOSIS — Z8249 Family history of ischemic heart disease and other diseases of the circulatory system: Secondary | ICD-10-CM | POA: Diagnosis not present

## 2013-11-03 DIAGNOSIS — Z79899 Other long term (current) drug therapy: Secondary | ICD-10-CM | POA: Diagnosis not present

## 2013-11-03 DIAGNOSIS — I25119 Atherosclerotic heart disease of native coronary artery with unspecified angina pectoris: Secondary | ICD-10-CM | POA: Diagnosis not present

## 2013-11-03 DIAGNOSIS — I272 Other secondary pulmonary hypertension: Secondary | ICD-10-CM | POA: Diagnosis not present

## 2013-11-03 DIAGNOSIS — F419 Anxiety disorder, unspecified: Secondary | ICD-10-CM | POA: Diagnosis not present

## 2013-11-03 DIAGNOSIS — I700 Atherosclerosis of aorta: Secondary | ICD-10-CM | POA: Diagnosis not present

## 2013-11-03 LAB — URINALYSIS, COMPLETE
BLOOD: NEGATIVE
Bacteria: NONE SEEN
Bilirubin,UR: NEGATIVE
GLUCOSE, UR: NEGATIVE mg/dL (ref 0–75)
Ketone: NEGATIVE
LEUKOCYTE ESTERASE: NEGATIVE
NITRITE: NEGATIVE
Ph: 6 (ref 4.5–8.0)
Protein: NEGATIVE
RBC,UR: 1 /HPF (ref 0–5)
SPECIFIC GRAVITY: 1.006 (ref 1.003–1.030)
WBC UR: <1 /HPF (ref 0–5)

## 2013-11-03 LAB — BASIC METABOLIC PANEL
ANION GAP: 6 — AB (ref 7–16)
BUN: 12 mg/dL (ref 7–18)
CALCIUM: 9.5 mg/dL (ref 8.5–10.1)
CHLORIDE: 105 mmol/L (ref 98–107)
CO2: 29 mmol/L (ref 21–32)
CREATININE: 0.73 mg/dL (ref 0.60–1.30)
Glucose: 110 mg/dL — ABNORMAL HIGH (ref 65–99)
OSMOLALITY: 280 (ref 275–301)
POTASSIUM: 3.7 mmol/L (ref 3.5–5.1)
Sodium: 140 mmol/L (ref 136–145)

## 2013-11-03 LAB — CBC
HCT: 43.6 % (ref 35.0–47.0)
HGB: 14.6 g/dL (ref 12.0–16.0)
MCH: 30.6 pg (ref 26.0–34.0)
MCHC: 33.5 g/dL (ref 32.0–36.0)
MCV: 91 fL (ref 80–100)
Platelet: 182 10*3/uL (ref 150–440)
RBC: 4.78 10*6/uL (ref 3.80–5.20)
RDW: 12.7 % (ref 11.5–14.5)
WBC: 5.7 10*3/uL (ref 3.6–11.0)

## 2013-11-03 LAB — TROPONIN I
Troponin-I: <0.02 ng/mL
Troponin-I: <0.02 ng/mL
Troponin-I: <0.02 ng/mL

## 2013-11-03 LAB — PRO B NATRIURETIC PEPTIDE: B-TYPE NATIURETIC PEPTID: 280 pg/mL (ref 0–450)

## 2013-11-03 NOTE — Telephone Encounter (Signed)
Pt states she has had chest pain"little sharp pains" for about 2 weeks, and has awoke with chest pressure.  Pt states she is also very weak.

## 2013-11-03 NOTE — Telephone Encounter (Signed)
Pt states she has had chest pain"little sharp pains" for about 2 weeks Pt states she is also very weak The pain is the worst in the morning and sometimes wakes her in the night  She has also been experiencing shortness of breath, chest pain and nausea  Her chest pain presents in different places at different times right left and middle chest  She feels like something is just not right  Usually it goes away but today "it is not letting up"  I instructed patient to go to the ED right away  Patient verbalized understanding

## 2013-11-04 DIAGNOSIS — I10 Essential (primary) hypertension: Secondary | ICD-10-CM | POA: Diagnosis not present

## 2013-11-04 DIAGNOSIS — I25119 Atherosclerotic heart disease of native coronary artery with unspecified angina pectoris: Secondary | ICD-10-CM | POA: Diagnosis not present

## 2013-11-04 DIAGNOSIS — I5033 Acute on chronic diastolic (congestive) heart failure: Secondary | ICD-10-CM | POA: Diagnosis not present

## 2013-11-04 DIAGNOSIS — I361 Nonrheumatic tricuspid (valve) insufficiency: Secondary | ICD-10-CM

## 2013-11-04 DIAGNOSIS — E785 Hyperlipidemia, unspecified: Secondary | ICD-10-CM | POA: Diagnosis not present

## 2013-11-04 DIAGNOSIS — R079 Chest pain, unspecified: Secondary | ICD-10-CM | POA: Diagnosis not present

## 2013-11-04 LAB — LIPID PANEL
CHOLESTEROL: 136 mg/dL (ref 0–200)
HDL Cholesterol: 56 mg/dL (ref 40–60)
Ldl Cholesterol, Calc: 14 mg/dL (ref 0–100)
TRIGLYCERIDES: 329 mg/dL — AB (ref 0–200)
VLDL Cholesterol, Calc: 66 mg/dL — ABNORMAL HIGH (ref 5–40)

## 2013-11-04 LAB — TSH: Thyroid Stimulating Horm: 3.68 u[IU]/mL

## 2013-11-04 LAB — HEMOGLOBIN A1C: Hemoglobin A1C: 5.8 % (ref 4.2–6.3)

## 2013-11-05 DIAGNOSIS — I5033 Acute on chronic diastolic (congestive) heart failure: Secondary | ICD-10-CM | POA: Diagnosis not present

## 2013-11-05 DIAGNOSIS — E785 Hyperlipidemia, unspecified: Secondary | ICD-10-CM | POA: Diagnosis not present

## 2013-11-05 DIAGNOSIS — I10 Essential (primary) hypertension: Secondary | ICD-10-CM | POA: Diagnosis not present

## 2013-11-05 DIAGNOSIS — R079 Chest pain, unspecified: Secondary | ICD-10-CM | POA: Diagnosis not present

## 2013-11-05 DIAGNOSIS — I25119 Atherosclerotic heart disease of native coronary artery with unspecified angina pectoris: Secondary | ICD-10-CM | POA: Diagnosis not present

## 2013-11-16 DIAGNOSIS — H3532 Exudative age-related macular degeneration: Secondary | ICD-10-CM | POA: Diagnosis not present

## 2013-11-16 DIAGNOSIS — H3531 Nonexudative age-related macular degeneration: Secondary | ICD-10-CM | POA: Diagnosis not present

## 2013-11-22 ENCOUNTER — Ambulatory Visit
Admission: RE | Admit: 2013-11-22 | Discharge: 2013-11-22 | Disposition: A | Discharge status: Home / Self Care | Payer: Medicare Other | Source: Ambulatory Visit | Attending: Geriatric Medicine | Admitting: Geriatric Medicine

## 2013-11-22 ENCOUNTER — Other Ambulatory Visit: Payer: Self-pay | Admitting: Geriatric Medicine

## 2013-11-22 DIAGNOSIS — I2510 Atherosclerotic heart disease of native coronary artery without angina pectoris: Secondary | ICD-10-CM | POA: Diagnosis not present

## 2013-11-22 DIAGNOSIS — M79602 Pain in left arm: Secondary | ICD-10-CM

## 2013-11-22 DIAGNOSIS — S5012XA Contusion of left forearm, initial encounter: Secondary | ICD-10-CM | POA: Diagnosis not present

## 2013-11-22 DIAGNOSIS — I10 Essential (primary) hypertension: Secondary | ICD-10-CM | POA: Diagnosis not present

## 2013-11-29 ENCOUNTER — Ambulatory Visit (INDEPENDENT_AMBULATORY_CARE_PROVIDER_SITE_OTHER): Payer: Medicare Other | Admitting: Cardiovascular Disease

## 2013-11-29 ENCOUNTER — Encounter: Payer: Self-pay | Admitting: Cardiovascular Disease

## 2013-11-29 VITALS — BP 158/68 | HR 80 | Ht 66.5 in | Wt 188.2 lb

## 2013-11-29 DIAGNOSIS — I25119 Atherosclerotic heart disease of native coronary artery with unspecified angina pectoris: Secondary | ICD-10-CM

## 2013-11-29 DIAGNOSIS — I6522 Occlusion and stenosis of left carotid artery: Secondary | ICD-10-CM | POA: Diagnosis not present

## 2013-11-29 DIAGNOSIS — I10 Essential (primary) hypertension: Secondary | ICD-10-CM | POA: Diagnosis not present

## 2013-11-29 DIAGNOSIS — E7800 Pure hypercholesterolemia, unspecified: Secondary | ICD-10-CM

## 2013-11-29 DIAGNOSIS — R5383 Other fatigue: Secondary | ICD-10-CM

## 2013-11-29 DIAGNOSIS — I2510 Atherosclerotic heart disease of native coronary artery without angina pectoris: Secondary | ICD-10-CM | POA: Diagnosis not present

## 2013-11-29 DIAGNOSIS — R0989 Other specified symptoms and signs involving the circulatory and respiratory systems: Secondary | ICD-10-CM

## 2013-11-29 DIAGNOSIS — I493 Ventricular premature depolarization: Secondary | ICD-10-CM

## 2013-11-29 DIAGNOSIS — R29898 Other symptoms and signs involving the musculoskeletal system: Secondary | ICD-10-CM

## 2013-11-29 NOTE — Assessment & Plan Note (Signed)
Cholesterol is at goal on the current lipid regimen. No changes to the medications were made. She is  taking a Lipitor 20 mg daily

## 2013-11-29 NOTE — Assessment & Plan Note (Signed)
Long discussion concerning her PVCs. Numerous EKGs were reviewed from prior office visits also showing frequent PVCs, often in a bigeminal pattern. She is currently on metoprolol. She does not want antiarrhythmic such as flecainide for her PVCs

## 2013-11-29 NOTE — Assessment & Plan Note (Signed)
Blood pressure is elevated. We discussed possibly starting an additional medication for blood pressure control. She is not interested in more medications at this time. We have recommended she monitor her blood pressure at home and call our office if systolic pressure runs more than 150 on a regular basis

## 2013-11-29 NOTE — Progress Notes (Signed)
Patient ID: [name], female    DOB: 12/30/1930, 80 y.o.   MRN: [id number]  HPI Comments: 80 yo woman with CAD, prior PCI of the LAD in 2001 with non DES, PCI of mid LCX with non des and cutting balloon, chronic chest pain episodes,  cardiac catheterization April 2014 showing occluded RCA, patent LAD and left circumflex and presents for followup . She has 50% carotid disease on the left.  She presents today for follow-up of her coronary artery disease, frequent PVCs, hypertension, carotid arterial disease  In follow-up she reports that she is tired, has weak legs in the morning. This is been a chronic issue for many years. Legs seem to get better in the afternoon. She only is sleeping 5 hours per night. She is uncertain why No further episodes of chest pain. Recently seen in the hospital 11/03/2013 for chest pain. Workup was negative. Stress test was offered but she declined Blood pressure was elevated in the hospital, likely exacerbated by anxiety. Losartan was increased up to 100 mg daily  Lab work from the hospital was reviewed with her showing total cholesterol 136, LDL 14, HDL 56, hemoglobin A1c 5.8, TSH 3.7 Echocardiogram 11/04/2013 was reviewed with her showing normal LV systolic function, moderately elevated right ventricular systolic pressure, otherwise normal study  She does not do any regular exercise.  PVCs were discussed with her today. Prior EKGs showing PVCs in a bigeminal pattern, also again seen today   EKG shows normal sinus rhythm with rate 80 bpm, PVCs and bigeminal pattern We discussed her carotid ultrasound with her which showed 40% left carotid arterial disease  She also reports having right flank pain which she attributes to prior gallbladder surgery. It was recommended that she follow-up with GI but she has not done so No relation to when she eats  Other past medical history  echocardiogram in 2012 demonstrated well-preserved left ventricular function, and trace  AI.   3 vessel PTCA at [facility] in 1986 and then had a stent placed several years ago  (non DES).      Outpatient Encounter Prescriptions as of 11/29/2013  Medication Sig  . amLODipine (NORVASC) 10 MG tablet Take 10 mg by mouth daily.  Marland Kitchen aspirin 81 MG tablet Take 81 mg by mouth daily.  Marland Kitchen atorvastatin (LIPITOR) 20 MG tablet Take 10 mg by mouth daily.   . Calcium Carbonate-Vitamin D (CALCIUM 600 + D PO) Take 1 tablet by mouth daily. 600 mg/ 400 mg  . clopidogrel (PLAVIX) 75 MG tablet Take 75 mg by mouth daily.  Marland Kitchen estrogens, conjugated, (PREMARIN) 0.3 MG tablet Take 0.3 mg by mouth daily. Take daily for 21 days then do not take for 7 days.  . furosemide (LASIX) 20 MG tablet Take 20 mg by mouth daily.  Marland Kitchen KLOR-CON M20 20 MEQ tablet TAKE 1 TABLET TWICE DAILY  . LORazepam (ATIVAN) 0.5 MG tablet Take 0.5 mg by mouth every 8 (eight) hours as needed for anxiety.   Marland Kitchen losartan (COZAAR) 100 MG tablet Take 1 tablet (100 mg total) by mouth daily.  . metoprolol succinate (TOPROL-XL) 100 MG 24 hr tablet Take 1 tablet (100 mg total) by mouth daily. Take with or immediately following a meal.  . Multiple Vitamin (MULTIVITAMIN) tablet Take 1 tablet by mouth daily.  . nitroGLYCERIN (NITROSTAT) 0.4 MG SL tablet Place 0.4 mg under the tongue every 5 (five) minutes as needed for chest pain.     Social history  reports that she has  never smoked. She does not have any smokeless tobacco history on file. She reports that she does not drink alcohol or use illicit drugs.  Review of Systems  Constitutional: Positive for fatigue.  HENT: Negative.   Respiratory: Negative.   Cardiovascular: Positive for palpitations.  Gastrointestinal: Positive for abdominal pain.       Right flank pain  Endocrine: Negative.   Musculoskeletal: Positive for arthralgias.       Chronic leg pain  Skin: Negative.   Allergic/Immunologic: Negative.   Neurological: Negative.   Hematological: Negative.   Psychiatric/Behavioral: Positive for  sleep disturbance.  All other systems reviewed and are negative.   BP 158/68 mmHg  Pulse 80  Ht 5' 6.5" (1.689 m)  Wt 188 lb 4 oz (85.39 kg)  BMI 29.93 kg/m2  Physical Exam  Constitutional: She is oriented to person, place, and time. She appears well-developed and well-nourished.  HENT:  Head: Normocephalic.  Nose: Nose normal.  Mouth/Throat: Oropharynx is clear and moist.  Eyes: Conjunctivae are normal. Pupils are equal, round, and reactive to light.  Neck: Normal range of motion. Neck supple. No JVD present.  Cardiovascular: Normal rate, S1 normal, S2 normal, normal heart sounds and intact distal pulses.  A regularly irregular rhythm present. Frequent extrasystoles are present. Exam reveals no gallop and no friction rub.   No murmur heard. Pulmonary/Chest: Effort normal and breath sounds normal. No respiratory distress. She has no wheezes. She has no rales. She exhibits no tenderness.  Abdominal: Soft. Bowel sounds are normal. She exhibits no distension. There is no tenderness.  Musculoskeletal: Normal range of motion. She exhibits no edema or tenderness.  Lymphadenopathy:    She has no cervical adenopathy.  Neurological: She is alert and oriented to person, place, and time. Coordination normal.  Skin: Skin is warm and dry. No rash noted. No erythema.  Psychiatric: She has a normal mood and affect. Her behavior is normal. Judgment and thought content normal.    Assessment and Plan  Nursing note and vitals reviewed.

## 2013-11-29 NOTE — Patient Instructions (Signed)
You are doing well. No medication changes were made.  Please call us if you have new issues that need to be addressed before your next appt.  Your physician wants you to follow-up in: 6 months.  You will receive a reminder letter in the mail two months in advance. If you don't receive a letter, please call our office to schedule the follow-up appointment.   

## 2013-11-29 NOTE — Assessment & Plan Note (Signed)
She has chronic history of leg fatigue, worse in the morning. She is sedentary, deconditioned. No longer working out at [employer]. We have strongly recommended she restart her exercise program

## 2013-11-29 NOTE — Assessment & Plan Note (Signed)
Continue periodic ultrasounds, aggressive cholesterol management

## 2013-11-29 NOTE — Assessment & Plan Note (Signed)
40-50% left carotid arterial disease. We'll continue aggressive cholesterol management. Repeat carotid ultrasound in 2 years

## 2013-11-29 NOTE — Assessment & Plan Note (Signed)
Currently with no symptoms of angina. No further workup at this time. Continue current medication regimen. 

## 2013-12-11 DIAGNOSIS — Z961 Presence of intraocular lens: Secondary | ICD-10-CM | POA: Diagnosis not present

## 2013-12-12 ENCOUNTER — Other Ambulatory Visit: Payer: Self-pay | Admitting: Cardiovascular Disease

## 2013-12-14 DIAGNOSIS — H3532 Exudative age-related macular degeneration: Secondary | ICD-10-CM | POA: Diagnosis not present

## 2013-12-21 ENCOUNTER — Encounter (HOSPITAL_COMMUNITY): Payer: Self-pay | Admitting: Cardiology

## 2014-01-30 DIAGNOSIS — H3532 Exudative age-related macular degeneration: Secondary | ICD-10-CM | POA: Diagnosis not present

## 2014-01-30 DIAGNOSIS — H43813 Vitreous degeneration, bilateral: Secondary | ICD-10-CM | POA: Diagnosis not present

## 2014-01-30 DIAGNOSIS — H3531 Nonexudative age-related macular degeneration: Secondary | ICD-10-CM | POA: Diagnosis not present

## 2014-02-09 ENCOUNTER — Other Ambulatory Visit: Payer: Self-pay

## 2014-02-09 MED ORDER — POTASSIUM CHLORIDE CRYS ER 20 MEQ PO TBCR: 20.0000 meq | EXTENDED_RELEASE_TABLET | Freq: Two times a day (BID) | ORAL | Status: DC

## 2014-02-09 NOTE — Telephone Encounter (Signed)
Refill sent for klor-con 20 meq 90 day supply.

## 2014-02-22 ENCOUNTER — Ambulatory Visit: Payer: Medicare Other

## 2014-02-22 ENCOUNTER — Other Ambulatory Visit: Payer: Self-pay | Admitting: Physician Assistant

## 2014-02-22 DIAGNOSIS — R059 Cough, unspecified: Secondary | ICD-10-CM

## 2014-02-22 DIAGNOSIS — R0602 Shortness of breath: Secondary | ICD-10-CM

## 2014-02-22 DIAGNOSIS — R05 Cough: Secondary | ICD-10-CM

## 2014-03-07 DIAGNOSIS — F3340 Major depressive disorder, recurrent, in remission, unspecified: Secondary | ICD-10-CM | POA: Diagnosis not present

## 2014-03-07 DIAGNOSIS — Z0000 Encounter for general adult medical examination without abnormal findings: Secondary | ICD-10-CM | POA: Diagnosis not present

## 2014-03-07 DIAGNOSIS — I2510 Atherosclerotic heart disease of native coronary artery without angina pectoris: Secondary | ICD-10-CM | POA: Diagnosis not present

## 2014-03-07 DIAGNOSIS — Z79899 Other long term (current) drug therapy: Secondary | ICD-10-CM | POA: Diagnosis not present

## 2014-03-07 DIAGNOSIS — I351 Nonrheumatic aortic (valve) insufficiency: Secondary | ICD-10-CM | POA: Diagnosis not present

## 2014-03-07 DIAGNOSIS — I10 Essential (primary) hypertension: Secondary | ICD-10-CM | POA: Diagnosis not present

## 2014-03-07 DIAGNOSIS — R5383 Other fatigue: Secondary | ICD-10-CM | POA: Diagnosis not present

## 2014-03-07 DIAGNOSIS — E780 Pure hypercholesterolemia: Secondary | ICD-10-CM | POA: Diagnosis not present

## 2014-03-07 DIAGNOSIS — Z1389 Encounter for screening for other disorder: Secondary | ICD-10-CM | POA: Diagnosis not present

## 2014-04-24 DIAGNOSIS — H43813 Vitreous degeneration, bilateral: Secondary | ICD-10-CM | POA: Diagnosis not present

## 2014-04-24 DIAGNOSIS — H3531 Nonexudative age-related macular degeneration: Secondary | ICD-10-CM | POA: Diagnosis not present

## 2014-04-24 DIAGNOSIS — H3532 Exudative age-related macular degeneration: Secondary | ICD-10-CM | POA: Diagnosis not present

## 2014-05-04 NOTE — Op Note (Signed)
PATIENT NAME:  [name], [name] MR#:  [id number] DATE OF BIRTH:  [birth year]  DATE OF PROCEDURE:  03/22/2012  PREOPERATIVE DIAGNOSIS: Visually significant cataract of the right eye.   POSTOPERATIVE DIAGNOSIS: Visually significant cataract of the right eye.   OPERATIVE PROCEDURE: Cataract extraction by phacoemulsification with implant of intraocular lens to right eye.   SURGEON: Birder Robson, MD.   ANESTHESIA:  1. Managed anesthesia care.  2. Topical tetracaine drops followed by 2% Xylocaine jelly applied in the preoperative holding area.   COMPLICATIONS: None.   TECHNIQUE:  Stop and chop_   DESCRIPTION OF PROCEDURE: The patient was examined and consented in the preoperative holding area where the aforementioned topical anesthesia was applied to the right eye and then brought back to the Operating Room where the right eye was prepped and draped in the usual sterile ophthalmic fashion and a lid speculum was placed. A paracentesis was created with the side port blade and the anterior chamber was filled with viscoelastic. A near clear corneal incision was performed with the steel keratome. A continuous curvilinear capsulorrhexis was performed with a cystotome followed by the capsulorrhexis forceps. Hydrodissection and hydrodelineation were carried out with BSS on a blunt cannula. The lens was removed in a stop and chop technique and the remaining cortical material was removed with the irrigation-aspiration handpiece. The capsular bag was inflated with viscoelastic and the Tecnis ZCB00 20.5 diopter lens, serial #[id number] was placed in the capsular bag without complication. The remaining viscoelastic was removed from the eye with the irrigation-aspiration handpiece. The wounds were hydrated. The anterior chamber was flushed with Miostat and the eye was inflated to physiologic pressure. Cefuroxime and was not placed within the eye due to severe penicillin allergy.  The wounds were found to be water  tight. The eye was dressed with Vigamox. The patient was given protective glasses to wear throughout the day and a shield with which to sleep tonight. The patient was also given drops with which to begin a drop regimen today and will follow-up with me in one day.     ____________________________ Livingston Diones. Porfilio, MD wlp:cc D: 03/22/2012 21:51:32 ET T: 03/22/2012 23:16:57 ET JOB#: [id number]  cc: William L. Porfilio, MD, <Dictator> Livingston Diones PORFILIO MD ELECTRONICALLY SIGNED 03/25/2012 17:10

## 2014-05-05 NOTE — H&P (Signed)
PATIENT NAME:  [name], [name] MR#:  [id number] DATE OF BIRTH:  1930-09-29  DATE OF ADMISSION:  11/03/2013  PRIMARY CARE PHYSICIAN: Dr. Felipa Eth   REFERRING PHYSICIAN: Baird Cancer. Quale, MD  CHIEF COMPLAINT: Chest pain on and off for 2 weeks.   HISTORY OF PRESENT ILLNESS: The patient is an 81 year old Caucasian female with a history of hypertension, coronary artery disease and lung cancer presented to the ED with the above chief complaint. The patient is alert, awake, oriented, in no acute distress. The patient said that she has had chest pain on and off for the past 2 weeks, which is on both sides of the chest, sharp, intermittent, radiation to the neck, about 8/10, lasted about a few seconds. The patient also complains of nausea, but denies any headache, dizziness. No fever or chills. No cough, sputum, shortness of breath or hemoptysis. No orthopnea, nocturnal dyspnea. No leg edema.   PAST MEDICAL HISTORY: Hypertension, CAD status post stent, lung cancer status post surgery, history of cardiac arrest.  SURGICAL HISTORY: Cardiac stent, lung cancer status post surgery on the right side about 10 years ago, hysterectomy, cholecystectomy.   SOCIAL HISTORY: No smoking or drinking or illicit drugs.    FAMILY HISTORY: Hypertension.   ALLERGIES: BELLADONNA, CELEBREX, CIPRO, CODEINE, DILAUDID, NITROFURANTOIN, OXYCODONE, PENICILLIN.   HOME MEDICATIONS: Vitamin B3 400 international units  p.o. daily, Premarin 0.3 mg p.o. daily, multivitamin with minerals 1 tablet once a day, losartan 50 mg p.o. daily, Klor-Con M20 p.o. tablets 1 tablet p.o. b.i.d., lorazepam 0.5 mg 3 times a day p.r.n., Lasix 20 mg p.o. daily, Plavix 75 mg p.o. daily, calcium 600 plus D 600 mg p.o. tablets b.i.d., atorvastatin 20 mg p.o.,  0.5 tablets once a day, aspirin 81 mg p.o. daily, Norvasc 10 mg p.o. daily, acetaminophen 650 mg p.o. 2 tablets every 8 hours p.r.n.   REVIEW OF SYSTEMS:  CONSTITUTIONAL: The patient denies any fever or  chills. No headache or dizziness, but has generalized weakness.  EYES: No double vision or blurry vision.  EARS, NOSE, AND THROAT: No postnasal drip, slurred speech or dysphagia.  CARDIOVASCULAR: Positive for chest pain. No palpitations, orthopnea, nocturnal dyspnea. No leg edema.  PULMONARY: No cough, sputum, shortness of breath or hemoptysis.  GASTROINTESTINAL: No abdominal pain, nausea, vomiting, diarrhea. No melena or bloody stool.  GENITOURINARY: No dysuria, hematuria, or incontinence.  SKIN: No rash or jaundice.  NEUROLOGIC: No syncope, loss of consciousness, or seizure.  ENDOCRINE: No polyuria, polydipsia, heat or cold intolerance.  HEMATOLOGY: No easy bleeding or bleeding.   PHYSICAL EXAMINATION:  VITAL SIGNS: Temperature 97.8, blood pressure 192/83, pulse 70, oxygen saturation 96% on room air.  GENERAL: The patient is alert, awake, oriented; in no acute distress.  HEENT: Pupils round, equal, and reactive to light and accommodation. Hard of hearing. Moist oral mucosa. Clear oropharynx.  NECK: Supple. No JVD or carotid bruits. No lymphadenopathy. No thyromegaly.  CARDIOVASCULAR: S1 and S2. Regular rate and rhythm. No murmurs or gallops.  PULMONARY: Bilateral air entry. No wheezing or rales. No use of accessory muscle to breathe.  ABDOMEN: Soft. No distention or tenderness. No organomegaly. Bowel sounds present.  EXTREMITIES: No edema, clubbing or cyanosis. No calf tenderness. Dorsalis pedal pulses present.  KIN: No rash or jaundice.  NEUROLOGIC: A and O x 3. No focal deficit. Power 5 out of 5. Sensation intact.   LABORATORY DATA: Chest x-ray did not show any acute chest abnormality. Troponin less than 0.02. CBC within normal range. Glucose 110,  BUN 12, creatinine 0.73.  Electrolytes were normal. BNP 280. EKG showed normal sinus rhythm at 78 beats per minute.   IMPRESSIONS:  1.  Chest pain; need to rule out acute coronary syndrome.  2.  History of coronary artery disease.  3.   Accelerated hypertension.  4.  History of lung cancer, status post surgery.  5.  Hyperlipidemia.   PLAN OF TREATMENT:  1.  The patient will be placed on observation. We will follow up troponin level. Continue o aspirin and Plavix, and get a cardiology consult.  2.  Cardiology consult from Dr. Rockey Situ.  3.  Continue aspirin, Plavix, statin, Lopressor, Toprol, losartan, Norvasc and Lasix.  4.  I discussed with the patient's condition and plan of treatment with the patient and the patient's son.   CODE STATUS: The patient wants Full Code.   TIME SPENT: About 63 minutes.    ____________________________ Demetrios Loll, MD qc:MT D: 11/03/2013 15:27:12 ET T: 11/03/2013 15:45:34 ET JOB#: [id number]  cc: Demetrios Loll, MD, <Dictator> Demetrios Loll MD ELECTRONICALLY SIGNED 11/04/2013 11:32

## 2014-05-05 NOTE — Discharge Summary (Signed)
PATIENT NAME:  [name], [name] MR#:  [id number] DATE OF BIRTH:  1930-08-24  DATE OF ADMISSION:  11/03/2013 DATE OF DISCHARGE:  11/05/2013  ADMITTING DIAGNOSIS: Chest pain, shortness of breath.  DISCHARGE DIAGNOSES:  1.  Chest pain, possibly due to angina with known chronically occluded right coronary artery. Medical management recommended, per cardiology. 2.  Accelerated hypertension. The patient's blood pressure medications have been adjusted. 3.  Shortness of breath with some mild pulmonary hypertension noted on echocardiogram, needs outpatient pulmonary function tests and sleep study. Possible diastolic congestive heart failure. 4.  History of coronary artery disease. 5.  History of lung cancer, status post resection. 6.  Hyperlipidemia.  7.  History of cardiac arrest. 8.  Status post hysterectomy. 9.  Status post cholecystectomy.  CONSULTANTS: Dr. Ida Rogue.  PERTINENT LABS AND EVALUATIONS: Admitting glucose 110, BUN 12, creatinine 0.73, sodium 140, potassium 3.7, chloride 105, CO2 29, calcium 9.5. LDL 14, cholesterol total 136, triglycerides 329, HDL 56. Troponin less than 0.02 x3. WBC 5.7, hemoglobin 14.6, platelet count 182,000.   Echocardiogram of the heart showed normal EF, diastolic relaxation abnormality, mild LVH, mild tricuspid regurg, mildly elevated pulmonary artery systolic pressures.  HISTORY AND HOSPITAL COURSE: Please refer to the H and P done by the admitting physician. The patient is an 81 year old white female with known RCA occlusion, presented to the ED with the complaint of chest pain and shortness of breath. In terms of her chest pain, the patient's cardiac enzymes remained negative. She was seen by cardiology who recommended medical management. Also, she was noted to have accelerated hypertension on presentation which could have played a role in her angina. The patient's blood pressure improved, her chest pain resolved. The patient was also complaining of shortness  of breath, underwent an echo which showed mild pulmonary hypertension. Even though her chest x-ray was negative, cardiology felt that she may have some diastolic CHF, was given Lasix with good diuresis. The patient needs outpatient pulmonary evaluation for further evaluation for her shortness of breath as well as likely sleep study. At this time, she is doing much better and is stable for discharge.   DISCHARGE MEDICATIONS: Premarin 0.3 one tab p.o. daily, Klor-Con 20 mEq 1 tab p.o. daily, aspirin 81 one tab p.o. daily, lorazepam 0.5 one tab p.o. t.i.d. as needed for anxiety, amlodipine 10 daily, Lasix 20 daily, Plavix 75 p.o. daily, multivitamin 1 tab p.o. daily, calcium plus vitamin D 1 tab p.o. b.i.d., vitamin D3 400 one tab p.o. daily, acetaminophen 650 q. 8 p.r.n., atorvastatin 10 mg at bedtime, metoprolol succinate 50 daily, Cozaar 100 one tab p.o. daily.  DISCHARGE DIET: Low sodium.  DISCHARGE ACTIVITY: As tolerated.  TIME FRAME FOR FOLLOWUP: Follow with primary MD in 1 to 2 weeks. Follow with [facility] in 2 to 4 weeks.  TIME SPENT ON DISCHARGE: 35 minutes.  ____________________________ Audrey Mosses Posey Pronto, MD shp:sb D: 11/07/2013 08:44:37 ET T: 11/07/2013 11:23:54 ET JOB#: [id number]  cc: Hiren Peplinski H. Posey Pronto, MD, <Dictator> Alric Seton MD ELECTRONICALLY SIGNED 11/18/2013 8:36

## 2014-05-05 NOTE — Consult Note (Signed)
General Aspect 81 yo woman with CAD, lung CA, prior PCI of lad in 2001 with non des, Pci of mid LCX with non des and cutting balloon, cardiac arrest, chronic chest pain episodes, cardiac catheterization April 2014 showing occluded RCA, patent LAD and left circumflex, 50% carotid disease on the left, presenting to the hospital with stuttering chest pain over the past 2 weeks. Cardiology was consulted for chest pain/angina.    The patient said that she has had chest pain on and off for the past 2 weeks. Pain is on both sides of the chest, sharp, intermittent, radiation to the neck about 8/10, lasting about a second. Periods of nausea, anxiety, but denies any headache, dizziness. No fever or chills. No cough, sputum, shortness of breath or hemoptysis. No orthopnea, nocturnal dyspnea. No leg edema.  Legs feel weak, not walking very well at baseline recently.  Last seen in clinic 05/2013 reporting legs have been very weak. Unable to walk very far.  Lipitor dose was decreased down from 20 mg to 10 mg by primary care. With this dose change, symptoms are about the same. Cholesterol on the 20 mg dosing was 125, LDL 47, HDL 51 She does not do any regular exercise. Denies any recent falls. No shortness of breath or palpitations  Metoprolol was increased in the past to 100 mg daily for PVCs . Losartan dose increased to 100 mg in clinic. She did not increase this as she was nervous about a higher dose.  She does report high blood rpessures whe she does check  echocardiogram in 2014 demonstrated well-preserved left ventricular function, moderate TR 3 vessel PTCA at [facility] in 1986 and then had a stent placed several years ago  (non DES)  EKG today shows normal sinus rhythm with rate 68 beats per minute, PVCs   Present Illness . SURGICAL HISTORY:  Cardiac stent, lung cancer status post surgery on the right side about 10 years ago, hysterectomy, cholecystectomy.   SOCIAL HISTORY:  No smoking or drinking or  illicit drugs.    FAMILY HISTORY:  Hypertension.   ALLERGIES:  BELLADONNA, CELEBREX, CIPRO, CODEINE, DILAUDID, NITROFURANTOIN, OXYCODONE, PENICILLIN.   Physical Exam:  GEN well developed, well nourished, no acute distress   HEENT hearing intact to voice, moist oral mucosa   NECK supple   RESP normal resp effort  clear BS   CARD Regular rate and rhythm  Normal, S1, S2  Murmur   Murmur Systolic   ABD denies tenderness   EXTR negative edema   SKIN normal to palpation   NEURO motor/sensory function intact   PSYCH alert, A+O to time, place, person, good insight   Review of Systems:  Subjective/Chief Complaint anxiety, brief, sharp chest pain (in different places on her chest), SOB, weakness   General: Weakness   Skin: No Complaints   ENT: No Complaints   Eyes: No Complaints   Neck: No Complaints   Respiratory: Short of breath   Cardiovascular: Chest pain or discomfort   Gastrointestinal: No Complaints   Genitourinary: No Complaints   Vascular: No Complaints   Musculoskeletal: No Complaints   Neurologic: No Complaints   Hematologic: No Complaints   Endocrine: No Complaints   Psychiatric: No Complaints   Review of Systems: All other systems were reviewed and found to be negative   Medications/Allergies Reviewed Medications/Allergies reviewed     Stent, Cardiac:    MI - Myocardial Infarct:    Pneumonia:    Lung Cancer:    Cesarean Section:  Hysterectomy - Total:    ERCP:    cholectystectomy:        Admit Diagnosis:   CHEST PAIN: Onset Date: 03-Nov-2013, Status: Active, Description: CHEST PAIN  Home Medications: Medication Instructions Status  Premarin 0.3 mg oral tablet 1 tab(s) orally once a day Active  Klor-Con M20 oral tablet, extended release 1 tab(s) orally 2 times a day Active  aspirin 81 mg oral tablet 1 tab(s) orally once a day Active  losartan 50 mg oral tablet 1 tab(s) orally once a day Active  lorazepam 0.5 mg oral  tablet 1 tab(s) orally 3 times a day, As Needed - for Anxiety, Nervousness Active  metoprolol succinate 50 mg oral tablet, extended release 1 tab(s) orally once a day Active  amLODIPine 10 mg oral tablet 1 tab(s) orally once a day Active  furosemide 20 mg oral tablet 1 tab(s) orally once a day Active  clopidogrel 75 mg oral tablet 1 tab(s) orally once a day (at bedtime) Active  multivitamin with minerals 1 tab(s) orally once a day Active  Calcium 600+D 600 mg-200 intl units oral tablet 1 tab(s) orally 2 times a day Active  Vitamin D3 400 intl units oral tablet 1 tab(s) orally once a day Active  acetaminophen 650 mg oral tablet, extended release 2 tab(s) orally every 8 hours, As Needed - for Pain Active  atorvastatin 20 mg oral tablet 0.5 tab(s) orally once a day (at bedtime) Active   Lab Results:  Routine Chem:  23-Oct-15 12:22   Glucose, Serum  110  BUN 12  Creatinine (comp) 0.73  Sodium, Serum 140  Potassium, Serum 3.7  Chloride, Serum 105  CO2, Serum 29  Calcium (Total), Serum 9.5  Anion Gap  6 (Result(s) reported on 03 Nov 2013 at 01:08PM.)  Osmolality (calc) 280  B-Type Natriuretic Peptide ([facility]) 280 (Result(s) reported on 03 Nov 2013 at 01:08PM.)  Cardiac:  23-Oct-15 12:22   Troponin I < 0.02 (0.00-0.05 0.05 ng/mL or less: NEGATIVE  Repeat testing in 3-6 hrs  if clinically indicated. >0.05 ng/mL: POTENTIAL  MYOCARDIAL INJURY. Repeat  testing in 3-6 hrs if  clinically indicated. NOTE: An increase or decrease  of 30% or more on serial  testing suggests a  clinically important change)  Routine Hem:  23-Oct-15 12:22   WBC (CBC) 5.7  RBC (CBC) 4.78  Hemoglobin (CBC) 14.6  Hematocrit (CBC) 43.6  Platelet Count (CBC) 182 (Result(s) reported on 03 Nov 2013 at 12:47PM.)  MCV 91  MCH 30.6  MCHC 33.5  RDW 12.7   EKG:  Interpretation EKG shows NSR with rate 78 bpm, no significnat ST or T wave changes   Radiology Results: XRay:    23-Oct-15 13:09, Chest PA and  Lateral  Chest PA and Lateral   REASON FOR EXAM:    chest pain  COMMENTS:       PROCEDURE: DXR - DXR CHEST PA (OR AP) AND LATERAL  - Nov 03 2013  1:09PM     CLINICAL DATA:  Chest pressure relieved with burping. Patient is  also coughing. History of lung cancer and partial right lung  removal.    EXAM:  CHEST  2 VIEW    COMPARISON:  08/21/2012    FINDINGS:  Two views of the chest demonstrate volume loss in the right  hemithorax which is unchanged. The lungs are clear bilaterally. The  thoracic aorta is heavily calcified. Heart size is normal and  stable. No evidence for pleural effusions.     IMPRESSION:  No acute chest abnormality.    Atherosclerotic calcifications in the aorta.      Electronically Signed    By: Markus Daft M.D.    On: 11/03/2013 13:15     Verified By: ADAM Acquanetta Belling, M.D.,    PCN: Hives  Codeine: GI Distress  Oxycontin: Rash  Zoloft: Fever  belladonna: Rash  Penicillin: Unknown  ciprofloxacin: Unknown  Nitrofurantoin: Unknown  Dilaudid: Unknown  Prednisone: Unknown  Celebrex: Unknown  Vital Signs/Nurse's Notes: **Vital Signs.:   23-Oct-15 17:02  Vital Signs Type Admission  Temperature Temperature (F) 98.4  Celsius 36.8  Temperature Source oral  Pulse Pulse 82  Respirations Respirations 17  Systolic BP Systolic BP 161  Diastolic BP (mmHg) Diastolic BP (mmHg) 82  Mean BP 118  Pulse Ox % Pulse Ox % 97  Pulse Ox Activity Level  At rest  Oxygen Delivery Room Air/ 21 %    Impression 81 yo woman with CAD, lung CA, prior PCI of lad in 2001 with non des, Pci of mid LCX with non des and cutting balloon, cardiac arrest, chronic chest pain episodes, cardiac catheterization April 2014 showing occluded RCA, patent LAD and left circumflex, 50% carotid disease on the left, presenting to the hospital with stuttering chest pain over the past 2 weeks. Cardiology was consulted for chest pain/angina. last cardiac cath April 2014 showing occluded RCA, patent  LAD and left circumflex  1) Chest pain: atypical in nature, very brief, sharp, different locations. EKG and enz appear normal Offered a lexiscan myoview, she is not interested as she does not like the way the test makes her feel. Recommended with work on her BP and watch her cardiac enz, if negative could go home this weekend with outpt follow up  2) Severe HTN: anxiety playing a role,  but a do suspect she may be poorly controlled at baseline She does not check at home,  but rare checks, BP has been very elevated,. Some medication noncompliance, picks and choses what she wanst to take ("allergies") --Would increase losartan up to 100 mg daily Given significant SOB tonight, will start lasix IV for possible acute on chronic diastolic CHF ECHO pending. previously has moderate TR, mild to moderate MR BNP pending --May need additional agents if BP continues to run high. Ativan started PRN  3) Acute on chronic diastolic CHF: very SOB after walking back from bathroom,  will give lasix IV 20 mg daily BNP pending  4) CAD, hx of PTCA: recent sx are somewhat atypical. prior cardiac cath April 2014 showing occluded RCA, patent LAD and left circumflex would continue outpt meds   Electronic Signatures: Ida Rogue (MD)  (Signed 23-Oct-15 20:03)  Authored: General Aspect/Present Illness, History and Physical Exam, Review of System, Past Medical History, Health Issues, Home Medications, Labs, EKG , Radiology, Allergies, Vital Signs/Nurse's Notes, Impression/Plan   Last Updated: 23-Oct-15 20:03 by Ida Rogue (MD)

## 2014-06-05 DIAGNOSIS — R5383 Other fatigue: Secondary | ICD-10-CM | POA: Diagnosis not present

## 2014-06-05 DIAGNOSIS — Z6830 Body mass index (BMI) 30.0-30.9, adult: Secondary | ICD-10-CM | POA: Diagnosis not present

## 2014-06-05 DIAGNOSIS — I10 Essential (primary) hypertension: Secondary | ICD-10-CM | POA: Diagnosis not present

## 2014-06-05 DIAGNOSIS — I2510 Atherosclerotic heart disease of native coronary artery without angina pectoris: Secondary | ICD-10-CM | POA: Diagnosis not present

## 2014-06-05 DIAGNOSIS — E669 Obesity, unspecified: Secondary | ICD-10-CM | POA: Diagnosis not present

## 2014-08-02 ENCOUNTER — Other Ambulatory Visit: Payer: Self-pay | Admitting: Cardiovascular Disease

## 2014-08-02 DIAGNOSIS — I6523 Occlusion and stenosis of bilateral carotid arteries: Secondary | ICD-10-CM

## 2014-08-15 ENCOUNTER — Encounter: Payer: Self-pay | Admitting: Cardiovascular Disease

## 2014-08-15 ENCOUNTER — Ambulatory Visit (INDEPENDENT_AMBULATORY_CARE_PROVIDER_SITE_OTHER): Payer: Medicare Other | Admitting: Cardiovascular Disease

## 2014-08-15 VITALS — BP 138/70 | HR 71 | Ht 66.5 in | Wt 184.2 lb

## 2014-08-15 DIAGNOSIS — I6523 Occlusion and stenosis of bilateral carotid arteries: Secondary | ICD-10-CM

## 2014-08-15 DIAGNOSIS — I6522 Occlusion and stenosis of left carotid artery: Secondary | ICD-10-CM

## 2014-08-15 DIAGNOSIS — R29898 Other symptoms and signs involving the musculoskeletal system: Secondary | ICD-10-CM

## 2014-08-15 DIAGNOSIS — E7800 Pure hypercholesterolemia, unspecified: Secondary | ICD-10-CM

## 2014-08-15 DIAGNOSIS — I493 Ventricular premature depolarization: Secondary | ICD-10-CM

## 2014-08-15 DIAGNOSIS — R5383 Other fatigue: Secondary | ICD-10-CM

## 2014-08-15 DIAGNOSIS — I10 Essential (primary) hypertension: Secondary | ICD-10-CM

## 2014-08-15 DIAGNOSIS — I25119 Atherosclerotic heart disease of native coronary artery with unspecified angina pectoris: Secondary | ICD-10-CM

## 2014-08-15 DIAGNOSIS — R0989 Other specified symptoms and signs involving the circulatory and respiratory systems: Secondary | ICD-10-CM

## 2014-08-15 MED ORDER — METOPROLOL SUCCINATE ER 50 MG PO TB24: 50.0000 mg | ORAL_TABLET | Freq: Every day | ORAL | Status: DC

## 2014-08-15 NOTE — Assessment & Plan Note (Signed)
Recommended continued exercise despite leg weakness and fatigue. Ideally she should start a regular exercise regimen

## 2014-08-15 NOTE — Assessment & Plan Note (Signed)
She is scheduled for repeat carotid ultrasound next week through our office. We'll continue aggressive cholesterol management. Goal LDL less than 70

## 2014-08-15 NOTE — Progress Notes (Signed)
Patient ID: [name], female    DOB: 1930/04/13, 81 y.o.   MRN: [id number]  HPI Comments: 81 yo woman with CAD, prior PCI of the LAD in 2001 with non DES, PCI of mid LCX with non des and cutting balloon, chronic chest pain episodes,  cardiac catheterization April 2014 showing occluded RCA, patent LAD and left circumflex and presents for followup . She has 50% carotid disease on the left.  She presents today for follow-up of her coronary artery disease, frequent PVCs, hypertension, carotid arterial disease 40% carotid disease on the left  In follow-up, she reports that she is doing well. Chronic leg weakness though she reports this is stable Legs get weak below the knees when she exerts herself. Still able to go shopping, leans on a shopping cart. Denies any leg edema, rarely appreciates a palpitation Does not want extra medications for PVCs No shortness of breath with exertion, no chest discomfort concerning for angina Previously did not tolerate high-dose metoprolol, 100 mg daily, now is tolerating metoprolol succinate 50 mg daily  EKG on today's visit shows normal sinus rhythm with rate 71 bpm, PVCs in a trigeminal pattern  Other past medical history  seen in the hospital 11/03/2013 for chest pain. Workup was negative. Stress test was offered but she declined Blood pressure was elevated in the hospital, likely exacerbated by anxiety. Losartan was increased up to 100 mg daily  Previous lab work total cholesterol 136, LDL 14, HDL 56, hemoglobin A1c 5.8, TSH 3.7 Echocardiogram 11/04/2013 was reviewed with her showing normal LV systolic function, moderately elevated right ventricular systolic pressure, otherwise normal study  echocardiogram in 2012 demonstrated well-preserved left ventricular function, and trace AI.   3 vessel PTCA at [facility] in 1986 and then had a stent placed several years ago  (non DES).     Allergies  Allergen Reactions  . Biaxin [Clarithromycin]     depressed  .  Celecoxib   . Codeine   . Lisinopril     cough  . Oxycodone Hcl   . Penicillins   . Sertraline Hcl     Current Outpatient Prescriptions on File Prior to Visit  Medication Sig Dispense Refill  . amLODipine (NORVASC) 10 MG tablet Take 10 mg by mouth daily.    Marland Kitchen aspirin 81 MG tablet Take 81 mg by mouth daily.    Marland Kitchen atorvastatin (LIPITOR) 20 MG tablet Take 20 mg by mouth daily.    . Calcium Carbonate-Vitamin D (CALCIUM 600 + D PO) Take 1 tablet by mouth daily. 600 mg/ 400 mg    . clopidogrel (PLAVIX) 75 MG tablet Take 75 mg by mouth daily.    Marland Kitchen estrogens, conjugated, (PREMARIN) 0.3 MG tablet Take 0.3 mg by mouth daily. Take daily for 21 days then do not take for 7 days.    . furosemide (LASIX) 20 MG tablet Take 20 mg by mouth daily.    Marland Kitchen LORazepam (ATIVAN) 0.5 MG tablet Take 0.5 mg by mouth every 8 (eight) hours as needed for anxiety.     Marland Kitchen losartan (COZAAR) 100 MG tablet Take 1 tablet (100 mg total) by mouth daily. 90 tablet 3  . Multiple Vitamin (MULTIVITAMIN) tablet Take 1 tablet by mouth daily.    . nitroGLYCERIN (NITROSTAT) 0.4 MG SL tablet Place 0.4 mg under the tongue every 5 (five) minutes as needed for chest pain.     . potassium chloride SA (KLOR-CON M20) 20 MEQ tablet Take 1 tablet (20 mEq total) by mouth  2 (two) times daily. 90 tablet 3   No current facility-administered medications on file prior to visit.    Past Medical History  Diagnosis Date  . HTN (hypertension)   . CAD (coronary artery disease)     a. 1986: s/p PTCA of bifurcation lesion in LAD;  b. 2001: s/p BMS to LAD & LCX;  c. 04/2006 Cath: diff CAD w/o critical narrowing, nl EF;  d. 06/2010 Echo: EF 60-65%, mild AI/MR.  . Carotid disease, bilateral     a. RICA 7-90%, LICA 24-09%  . Hyperlipidemia   . Macular degeneration   . Depression   . Anxiety   . Atypical carcinoid lung tumor   . Cholelithiases   . TIA (transient ischemic attack)   . Positional vertigo     benign  . Impaired fasting blood sugar   .  Symptoms, such as flushing, sleeplessness, headache, lack of concentration, associated with the menopause   . Osteoarthritis   . Cataracts, bilateral   . Keloid     mid sternum  . Gilbert's syndrome   . Hyperglycemia     mild    Past Surgical History  Procedure Laterality Date  . Pci  1986  . Carcinoid      removed from lung, [place]  . Pic of lad  2001    with non des lad   . Pci of mid cfx      with non des and cutting balloon pci of av circumflex  . Left heart catheterization with coronary angiogram N/A 04/27/2012    Procedure: LEFT HEART CATHETERIZATION WITH CORONARY ANGIOGRAM;  Surgeon: Peter M Martinique, MD;  Location: [facility] CATH LAB;  Service: Cardiovascular;  Laterality: N/A;    Social History  reports that she has never smoked. She does not have any smokeless tobacco history on file. She reports that she does not drink alcohol or use illicit drugs.  Family History family history includes CAD in her father and mother; Diabetes Mellitus I in her brother, mother, sister, and sister.   Review of Systems  Constitutional: Positive for fatigue.  Respiratory: Negative.   Cardiovascular: Positive for palpitations.  Endocrine: Negative.   Musculoskeletal: Positive for arthralgias.       Chronic leg pain  Skin: Negative.   Allergic/Immunologic: Negative.   Neurological: Negative.   Hematological: Negative.   Psychiatric/Behavioral: Positive for sleep disturbance.  All other systems reviewed and are negative.   BP 138/70 mmHg  Pulse 71  Ht 5' 6.5" (1.689 m)  Wt 184 lb 4 oz (83.575 kg)  BMI 29.30 kg/m2  Physical Exam  Constitutional: She is oriented to person, place, and time. She appears well-developed and well-nourished.  HENT:  Head: Normocephalic.  Nose: Nose normal.  Mouth/Throat: Oropharynx is clear and moist.  Eyes: Conjunctivae are normal. Pupils are equal, round, and reactive to light.  Neck: Normal range of motion. Neck supple. No JVD present.  Cardiovascular:  Normal rate, S1 normal, S2 normal and intact distal pulses.  A regularly irregular rhythm present. Frequent extrasystoles are present. Exam reveals no gallop and no friction rub.   Murmur heard.  Systolic murmur is present with a grade of 1/6  Pulmonary/Chest: Effort normal and breath sounds normal. No respiratory distress. She has no wheezes. She has no rales. She exhibits no tenderness.  Abdominal: Soft. Bowel sounds are normal. She exhibits no distension. There is no tenderness.  Musculoskeletal: Normal range of motion. She exhibits no edema or tenderness.  Lymphadenopathy:    She  has no cervical adenopathy.  Neurological: She is alert and oriented to person, place, and time. Coordination normal.  Skin: Skin is warm and dry. No rash noted. No erythema.  Psychiatric: She has a normal mood and affect. Her behavior is normal. Judgment and thought content normal.    Assessment and Plan  Nursing note and vitals reviewed.

## 2014-08-15 NOTE — Assessment & Plan Note (Signed)
Currently with no symptoms of angina. No further workup at this time. Continue current medication regimen. 

## 2014-08-15 NOTE — Patient Instructions (Signed)
You are doing well. No medication changes were made.  Please call us if you have new issues that need to be addressed before your next appt.  Your physician wants you to follow-up in: 12 months.  You will receive a reminder letter in the mail two months in advance. If you don't receive a letter, please call our office to schedule the follow-up appointment. 

## 2014-08-15 NOTE — Assessment & Plan Note (Signed)
Asymptomatic PVCs. She does not want to change her medications at this time as she is not having symptoms

## 2014-08-15 NOTE — Assessment & Plan Note (Signed)
Encouraged her to stay on her Lipitor 20 mg daily. Goal LDL less than 70

## 2014-08-15 NOTE — Assessment & Plan Note (Signed)
Repeat carotid ultrasound pending next week

## 2014-08-15 NOTE — Assessment & Plan Note (Signed)
Blood pressure is well controlled on today's visit. No changes made to the medications. 

## 2014-08-17 ENCOUNTER — Ambulatory Visit (INDEPENDENT_AMBULATORY_CARE_PROVIDER_SITE_OTHER): Payer: Medicare Other

## 2014-08-17 ENCOUNTER — Other Ambulatory Visit: Payer: Self-pay | Admitting: Cardiovascular Disease

## 2014-08-17 DIAGNOSIS — I6523 Occlusion and stenosis of bilateral carotid arteries: Secondary | ICD-10-CM | POA: Diagnosis not present

## 2014-08-29 ENCOUNTER — Other Ambulatory Visit: Payer: Self-pay | Admitting: Cardiovascular Disease

## 2014-09-04 DIAGNOSIS — H3531 Nonexudative age-related macular degeneration: Secondary | ICD-10-CM | POA: Diagnosis not present

## 2014-09-04 DIAGNOSIS — H43813 Vitreous degeneration, bilateral: Secondary | ICD-10-CM | POA: Diagnosis not present

## 2014-09-04 DIAGNOSIS — H4322 Crystalline deposits in vitreous body, left eye: Secondary | ICD-10-CM | POA: Diagnosis not present

## 2014-09-04 DIAGNOSIS — H3532 Exudative age-related macular degeneration: Secondary | ICD-10-CM | POA: Diagnosis not present

## 2014-09-06 DIAGNOSIS — I351 Nonrheumatic aortic (valve) insufficiency: Secondary | ICD-10-CM | POA: Diagnosis not present

## 2014-09-06 DIAGNOSIS — Z79899 Other long term (current) drug therapy: Secondary | ICD-10-CM | POA: Diagnosis not present

## 2014-09-06 DIAGNOSIS — I10 Essential (primary) hypertension: Secondary | ICD-10-CM | POA: Diagnosis not present

## 2014-12-11 DIAGNOSIS — H353122 Nonexudative age-related macular degeneration, left eye, intermediate dry stage: Secondary | ICD-10-CM | POA: Diagnosis not present

## 2014-12-11 DIAGNOSIS — H353211 Exudative age-related macular degeneration, right eye, with active choroidal neovascularization: Secondary | ICD-10-CM | POA: Diagnosis not present

## 2014-12-11 DIAGNOSIS — H43813 Vitreous degeneration, bilateral: Secondary | ICD-10-CM | POA: Diagnosis not present

## 2014-12-11 DIAGNOSIS — H4322 Crystalline deposits in vitreous body, left eye: Secondary | ICD-10-CM | POA: Diagnosis not present

## 2014-12-17 DIAGNOSIS — Z961 Presence of intraocular lens: Secondary | ICD-10-CM | POA: Diagnosis not present

## 2014-12-17 DIAGNOSIS — H353132 Nonexudative age-related macular degeneration, bilateral, intermediate dry stage: Secondary | ICD-10-CM | POA: Diagnosis not present

## 2014-12-28 ENCOUNTER — Emergency Department: Payer: Medicare Other

## 2014-12-28 ENCOUNTER — Inpatient Hospital Stay
Admission: EM | Admit: 2014-12-28 | Discharge: 2014-12-30 | DRG: 391 | Disposition: A | Discharge status: Home / Self Care | Payer: Medicare Other | Attending: Internal Medicine | Admitting: Internal Medicine

## 2014-12-28 ENCOUNTER — Observation Stay (INDEPENDENT_AMBULATORY_CARE_PROVIDER_SITE_OTHER)
Admit: 2014-12-28 | Discharge: 2014-12-28 | Disposition: A | Discharge status: Home / Self Care | Payer: Medicare Other | Attending: Internal Medicine | Admitting: Internal Medicine

## 2014-12-28 DIAGNOSIS — R112 Nausea with vomiting, unspecified: Secondary | ICD-10-CM | POA: Diagnosis not present

## 2014-12-28 DIAGNOSIS — H3530 Unspecified macular degeneration: Secondary | ICD-10-CM | POA: Diagnosis present

## 2014-12-28 DIAGNOSIS — I209 Angina pectoris, unspecified: Secondary | ICD-10-CM | POA: Diagnosis not present

## 2014-12-28 DIAGNOSIS — Z888 Allergy status to other drugs, medicaments and biological substances status: Secondary | ICD-10-CM

## 2014-12-28 DIAGNOSIS — Z79899 Other long term (current) drug therapy: Secondary | ICD-10-CM

## 2014-12-28 DIAGNOSIS — I25110 Atherosclerotic heart disease of native coronary artery with unstable angina pectoris: Secondary | ICD-10-CM | POA: Insufficient documentation

## 2014-12-28 DIAGNOSIS — Z955 Presence of coronary angioplasty implant and graft: Secondary | ICD-10-CM

## 2014-12-28 DIAGNOSIS — Z9889 Other specified postprocedural states: Secondary | ICD-10-CM

## 2014-12-28 DIAGNOSIS — R079 Chest pain, unspecified: Secondary | ICD-10-CM | POA: Insufficient documentation

## 2014-12-28 DIAGNOSIS — A059 Bacterial foodborne intoxication, unspecified: Secondary | ICD-10-CM | POA: Diagnosis present

## 2014-12-28 DIAGNOSIS — I214 Non-ST elevation (NSTEMI) myocardial infarction: Secondary | ICD-10-CM | POA: Diagnosis not present

## 2014-12-28 DIAGNOSIS — I340 Nonrheumatic mitral (valve) insufficiency: Secondary | ICD-10-CM | POA: Diagnosis not present

## 2014-12-28 DIAGNOSIS — K30 Functional dyspepsia: Secondary | ICD-10-CM | POA: Diagnosis present

## 2014-12-28 DIAGNOSIS — H269 Unspecified cataract: Secondary | ICD-10-CM | POA: Diagnosis present

## 2014-12-28 DIAGNOSIS — K529 Noninfective gastroenteritis and colitis, unspecified: Secondary | ICD-10-CM | POA: Insufficient documentation

## 2014-12-28 DIAGNOSIS — F419 Anxiety disorder, unspecified: Secondary | ICD-10-CM | POA: Diagnosis not present

## 2014-12-28 DIAGNOSIS — R0789 Other chest pain: Secondary | ICD-10-CM | POA: Diagnosis not present

## 2014-12-28 DIAGNOSIS — E785 Hyperlipidemia, unspecified: Secondary | ICD-10-CM | POA: Diagnosis present

## 2014-12-28 DIAGNOSIS — Z833 Family history of diabetes mellitus: Secondary | ICD-10-CM

## 2014-12-28 DIAGNOSIS — Z886 Allergy status to analgesic agent status: Secondary | ICD-10-CM

## 2014-12-28 DIAGNOSIS — R1110 Vomiting, unspecified: Secondary | ICD-10-CM | POA: Insufficient documentation

## 2014-12-28 DIAGNOSIS — I493 Ventricular premature depolarization: Secondary | ICD-10-CM | POA: Diagnosis present

## 2014-12-28 DIAGNOSIS — I25111 Atherosclerotic heart disease of native coronary artery with angina pectoris with documented spasm: Secondary | ICD-10-CM

## 2014-12-28 DIAGNOSIS — Z880 Allergy status to penicillin: Secondary | ICD-10-CM

## 2014-12-28 DIAGNOSIS — I200 Unstable angina: Secondary | ICD-10-CM | POA: Diagnosis not present

## 2014-12-28 DIAGNOSIS — Z8249 Family history of ischemic heart disease and other diseases of the circulatory system: Secondary | ICD-10-CM

## 2014-12-28 DIAGNOSIS — F329 Major depressive disorder, single episode, unspecified: Secondary | ICD-10-CM | POA: Diagnosis present

## 2014-12-28 DIAGNOSIS — Z7982 Long term (current) use of aspirin: Secondary | ICD-10-CM

## 2014-12-28 DIAGNOSIS — M1990 Unspecified osteoarthritis, unspecified site: Secondary | ICD-10-CM | POA: Diagnosis present

## 2014-12-28 DIAGNOSIS — I10 Essential (primary) hypertension: Secondary | ICD-10-CM | POA: Diagnosis present

## 2014-12-28 DIAGNOSIS — K219 Gastro-esophageal reflux disease without esophagitis: Secondary | ICD-10-CM | POA: Diagnosis not present

## 2014-12-28 DIAGNOSIS — Z8673 Personal history of transient ischemic attack (TIA), and cerebral infarction without residual deficits: Secondary | ICD-10-CM

## 2014-12-28 LAB — TROPONIN I
TROPONIN I: 0.27 ng/mL — AB (ref <0.031)
Troponin I: 0.44 ng/mL — ABNORMAL HIGH (ref <0.031)
Troponin I: 0.53 ng/mL — ABNORMAL HIGH (ref <0.031)

## 2014-12-28 LAB — CBC
HCT: 40.3 % (ref 35.0–47.0)
HEMOGLOBIN: 13.6 g/dL (ref 12.0–16.0)
MCH: 29.9 pg (ref 26.0–34.0)
MCHC: 33.7 g/dL (ref 32.0–36.0)
MCV: 88.7 fL (ref 80.0–100.0)
PLATELETS: 164 10*3/uL (ref 150–440)
RBC: 4.54 MIL/uL (ref 3.80–5.20)
RDW: 12.8 % (ref 11.5–14.5)
WBC: 6.9 10*3/uL (ref 3.6–11.0)

## 2014-12-28 LAB — BASIC METABOLIC PANEL
Anion gap: 8 (ref 5–15)
BUN: 17 mg/dL (ref 6–20)
CALCIUM: 9.2 mg/dL (ref 8.9–10.3)
CHLORIDE: 105 mmol/L (ref 101–111)
CO2: 26 mmol/L (ref 22–32)
CREATININE: 0.74 mg/dL (ref 0.44–1.00)
GFR calc Af Amer: >60 mL/min (ref >60)
GFR calc non Af Amer: >60 mL/min (ref >60)
GLUCOSE: 182 mg/dL — AB (ref 65–99)
Potassium: 3.6 mmol/L (ref 3.5–5.1)
Sodium: 139 mmol/L (ref 135–145)

## 2014-12-28 MED ORDER — CLOPIDOGREL BISULFATE 75 MG PO TABS
75.0000 mg | ORAL_TABLET | Freq: Every day | ORAL | Status: DC
  Administered 2014-12-28 – 2014-12-30 (×3): 75 mg via ORAL
  Filled 2014-12-28 (×3): qty 1

## 2014-12-28 MED ORDER — POTASSIUM CHLORIDE CRYS ER 20 MEQ PO TBCR
20.0000 meq | EXTENDED_RELEASE_TABLET | Freq: Two times a day (BID) | ORAL | Status: DC
  Administered 2014-12-28 – 2014-12-30 (×5): 20 meq via ORAL
  Filled 2014-12-28 (×5): qty 1

## 2014-12-28 MED ORDER — PANTOPRAZOLE SODIUM 40 MG PO TBEC
40.0000 mg | DELAYED_RELEASE_TABLET | Freq: Every day | ORAL | Status: DC
  Administered 2014-12-28 – 2014-12-30 (×3): 40 mg via ORAL
  Filled 2014-12-28 (×5): qty 1

## 2014-12-28 MED ORDER — AMLODIPINE BESYLATE 10 MG PO TABS
10.0000 mg | ORAL_TABLET | Freq: Every day | ORAL | Status: DC
  Administered 2014-12-28 – 2014-12-30 (×3): 10 mg via ORAL
  Filled 2014-12-28 (×3): qty 1

## 2014-12-28 MED ORDER — ACETAMINOPHEN 325 MG PO TABS
650.0000 mg | ORAL_TABLET | ORAL | Status: DC | PRN
  Administered 2014-12-30: 650 mg via ORAL
  Filled 2014-12-28: qty 2

## 2014-12-28 MED ORDER — ADULT MULTIVITAMIN W/MINERALS CH
1.0000 | ORAL_TABLET | Freq: Every day | ORAL | Status: DC
  Administered 2014-12-28 – 2014-12-30 (×3): 1 via ORAL
  Filled 2014-12-28 (×3): qty 1

## 2014-12-28 MED ORDER — METOPROLOL SUCCINATE ER 50 MG PO TB24
50.0000 mg | ORAL_TABLET | Freq: Every day | ORAL | Status: DC
  Administered 2014-12-28 – 2014-12-30 (×3): 50 mg via ORAL
  Filled 2014-12-28 (×3): qty 1

## 2014-12-28 MED ORDER — SODIUM CHLORIDE 0.9 % IV SOLN
INTRAVENOUS | Status: DC
  Administered 2014-12-28 – 2014-12-30 (×5): via INTRAVENOUS

## 2014-12-28 MED ORDER — CALCIUM CARBONATE-VITAMIN D 500-200 MG-UNIT PO TABS
1.0000 | ORAL_TABLET | Freq: Every morning | ORAL | Status: DC
  Administered 2014-12-28 – 2014-12-30 (×3): 1 via ORAL
  Filled 2014-12-28 (×5): qty 1

## 2014-12-28 MED ORDER — FUROSEMIDE 20 MG PO TABS
20.0000 mg | ORAL_TABLET | Freq: Every day | ORAL | Status: DC
  Administered 2014-12-28 – 2014-12-30 (×3): 20 mg via ORAL
  Filled 2014-12-28 (×3): qty 1

## 2014-12-28 MED ORDER — LORAZEPAM 0.5 MG PO TABS
0.5000 mg | ORAL_TABLET | Freq: Three times a day (TID) | ORAL | Status: DC | PRN
  Administered 2014-12-29 (×2): 0.5 mg via ORAL
  Filled 2014-12-28 (×2): qty 1

## 2014-12-28 MED ORDER — ASPIRIN 81 MG PO CHEW
81.0000 mg | CHEWABLE_TABLET | Freq: Every day | ORAL | Status: DC
Start: 2014-12-28 — End: 2014-12-30
  Administered 2014-12-28 – 2014-12-30 (×3): 81 mg via ORAL
  Filled 2014-12-28 (×3): qty 1

## 2014-12-28 MED ORDER — ONDANSETRON HCL 4 MG/2ML IJ SOLN: 4.0000 mg | Freq: Four times a day (QID) | INTRAMUSCULAR | Status: DC | PRN

## 2014-12-28 MED ORDER — ENOXAPARIN SODIUM 40 MG/0.4ML ~~LOC~~ SOLN
40.0000 mg | SUBCUTANEOUS | Status: DC
  Administered 2014-12-28 – 2014-12-29 (×2): 40 mg via SUBCUTANEOUS
  Filled 2014-12-28 (×2): qty 0.4

## 2014-12-28 MED ORDER — ATORVASTATIN CALCIUM 20 MG PO TABS
20.0000 mg | ORAL_TABLET | Freq: Every day | ORAL | Status: DC
  Administered 2014-12-28 – 2014-12-30 (×3): 20 mg via ORAL
  Filled 2014-12-28 (×3): qty 1

## 2014-12-28 MED ORDER — NITROGLYCERIN 0.4 MG SL SUBL
0.4000 mg | SUBLINGUAL_TABLET | SUBLINGUAL | Status: DC | PRN
  Administered 2014-12-29: 0.4 mg via SUBLINGUAL

## 2014-12-28 MED ORDER — LOSARTAN POTASSIUM 50 MG PO TABS
100.0000 mg | ORAL_TABLET | Freq: Every day | ORAL | Status: DC
  Administered 2014-12-28 – 2014-12-30 (×3): 100 mg via ORAL
  Filled 2014-12-28 (×3): qty 2

## 2014-12-28 NOTE — ED Notes (Signed)
Pt brought in by EMS.  EMS states pt c/o chest pressure starting last night at approx 8pm.  Pt states she thought it was indigestions, so she ignored it until pain started radiating to back/shoulders.  Pt has thrown up approx 4 times since chest pain began.  EMS reports aspirin given to pt at home.  States they gave 2 sprays of nitro and 4 of zofran and pt states chest pressure has been relieved.

## 2014-12-28 NOTE — ED Notes (Signed)
X-ray at bedside

## 2014-12-28 NOTE — Progress Notes (Signed)
    Updated patient on troponin level. Discussed evaluation option in detail. She agreeable to proceeding with Lexiscan Myoview on 12/29/2014 to evaluate for high risk ischemia. Questions answered and concerns listened to. No need to start full dose heparin or Lovenox per Dr. Fletcher Anon, MD at this time. Will continue to cycle troponin this PM until they peak. Dr. Curt Bears, MD is on for [facility] on Saturday 12/29/2014.   Christell Faith, PA-C 12/28/2014 3:22 PM

## 2014-12-28 NOTE — Progress Notes (Signed)
Dr. Rockey Situ aware of troponin 0.27

## 2014-12-28 NOTE — Progress Notes (Signed)
*  PRELIMINARY RESULTS* Echocardiogram 2D Echocardiogram has been performed.  [name] 12/28/2014, 11:37 AM

## 2014-12-28 NOTE — H&P (Signed)
[facility] [facility] [place] NAME: [name]    MR#:  [id number]  DATE OF BIRTH:  1930-11-09  DATE OF ADMISSION:  12/28/2014  PRIMARY CARE PHYSICIAN: Mathews Argyle, MD   REQUESTING/REFERRING PHYSICIAN: Dr. Owens Shark  CHIEF COMPLAINT:   Indigestion, chest discomfort HISTORY OF PRESENT ILLNESS:  [name]  is a 81 y.o. female with a known history of coronary artery disease status post PTCA in 1986, hyperlipidemia, depression/anxiety, hypertension comes to the emergency room after she started having chest discomfort around 3:00 in the morning. Patient reports eating pizza for dinner last night. Around 8:00 she started having some indigestion burning in her chest. She went to bed. Thereafter she woke up around 3:00 and vomited 2. She tried to lay down however was feeling very uncomfortable with burning and chest pain. She was brought to the emergency room. Her first set of troponin is negative. EKG does not show any acute changes. Currently patient is asymptomatic. She feels tired and fatigued. She is being admitted for further evaluation and management given her coronary artery disease.  PAST MEDICAL HISTORY:   Past Medical History  Diagnosis Date  . HTN (hypertension)   . CAD (coronary artery disease)     a. 1986: s/p PTCA of bifurcation lesion in LAD;  b. 2001: s/p BMS to LAD & LCX;  c. 04/2006 Cath: diff CAD w/o critical narrowing, nl EF;  d. 06/2010 Echo: EF 60-65%, mild AI/MR.  . Carotid disease, bilateral ([place])     a. RICA XX123456, LICA 123456  . Hyperlipidemia   . Macular degeneration   . Depression   . Anxiety   . Atypical carcinoid lung tumor ([place])   . Cholelithiases   . TIA (transient ischemic attack)   . Positional vertigo     benign  . Impaired fasting blood sugar   . Symptoms, such as flushing, sleeplessness, headache, lack of concentration, associated with the menopause   . Osteoarthritis   . Cataracts, bilateral   .  Keloid     mid sternum  . Gilbert's syndrome   . Hyperglycemia     mild    PAST SURGICAL HISTOIRY:   Past Surgical History  Procedure Laterality Date  . Pci  1986  . Carcinoid      removed from lung, burney  . Pic of lad  2001    with non des lad   . Pci of mid cfx      with non des and cutting balloon pci of av circumflex  . Left heart catheterization with coronary angiogram N/A 04/27/2012    Procedure: LEFT HEART CATHETERIZATION WITH CORONARY ANGIOGRAM;  Surgeon: Peter M Martinique, MD;  Location: [facility] CATH LAB;  Service: Cardiovascular;  Laterality: N/A;    SOCIAL HISTORY:   Social History  Substance Use Topics  . Smoking status: Never Smoker   . Smokeless tobacco: Not on file  . Alcohol Use: No    FAMILY HISTORY:   Family History  Problem Relation Age of Onset  . Diabetes Mellitus I Mother   . CAD Mother   . CAD Father   . Diabetes Mellitus I Sister   . Diabetes Mellitus I Brother   . Diabetes Mellitus I Sister     DRUG ALLERGIES:   Allergies  Allergen Reactions  . Biaxin [Clarithromycin]     depressed  . Celecoxib Other (See Comments)    Reaction: Unknown  . Codeine Other (See Comments)    Reaction:  Unknown  . Lisinopril     cough  . Macrobid [employer] Macro] Other (See Comments)    Reaction: Unknown  . Oxycodone Hcl Other (See Comments)    Reaction: Unknown  . Penicillins Other (See Comments)    Reaction: Unknown  . Sertraline Hcl Other (See Comments)    Reaction: Unknown    REVIEW OF SYSTEMS:  Review of Systems  Constitutional: Negative for fever, chills and weight loss.  HENT: Negative for ear discharge, ear pain and nosebleeds.   Eyes: Negative for blurred vision, pain and discharge.  Respiratory: Negative for sputum production, shortness of breath, wheezing and stridor.   Cardiovascular: Negative for chest pain, palpitations, orthopnea and PND.  Gastrointestinal: Positive for heartburn, nausea and vomiting. Negative for  abdominal pain and diarrhea.  Genitourinary: Negative for urgency and frequency.  Musculoskeletal: Negative for back pain and joint pain.  Neurological: Negative for sensory change, speech change, focal weakness and weakness.  Psychiatric/Behavioral: Negative for depression and hallucinations. The patient is not nervous/anxious.   All other systems reviewed and are negative.    MEDICATIONS AT HOME:   Prior to Admission medications   Medication Sig Start Date End Date Taking? Authorizing Provider  amLODipine (NORVASC) 10 MG tablet Take 10 mg by mouth daily.   Yes Historical Provider, MD  aspirin 81 MG tablet Take 81 mg by mouth daily.   Yes Historical Provider, MD  atorvastatin (LIPITOR) 20 MG tablet Take 20 mg by mouth daily.   Yes Historical Provider, MD  Calcium Carbonate-Vitamin D (CALCIUM 600 + D PO) Take 1 tablet by mouth daily. 600 mg/ 400 mg   Yes Historical Provider, MD  clopidogrel (PLAVIX) 75 MG tablet Take 75 mg by mouth daily.   Yes Historical Provider, MD  estrogens, conjugated, (PREMARIN) 0.3 MG tablet Take 0.3 mg by mouth daily. Take daily for 21 days then do not take for 7 days.   Yes Historical Provider, MD  furosemide (LASIX) 20 MG tablet Take 20 mg by mouth daily.   Yes Historical Provider, MD  KLOR-CON M20 20 MEQ tablet TAKE 1 TABLET (20 MEQ TOTAL) BY MOUTH 2 (TWO) TIMES DAILY. 08/17/14  Yes Minna Merritts, MD  LORazepam (ATIVAN) 0.5 MG tablet Take 0.5 mg by mouth every 8 (eight) hours as needed for anxiety.    Yes Historical Provider, MD  losartan (COZAAR) 100 MG tablet TAKE 1 TABLET (100 MG TOTAL) BY MOUTH DAILY. 08/30/14  Yes Minna Merritts, MD  metoprolol succinate (TOPROL-XL) 50 MG 24 hr tablet Take 1 tablet (50 mg total) by mouth daily. Take with or immediately following a meal. 08/15/14  Yes Minna Merritts, MD  Multiple Vitamin (MULTIVITAMIN) tablet Take 1 tablet by mouth daily.   Yes Historical Provider, MD  nitroGLYCERIN (NITROSTAT) 0.4 MG SL tablet Place 0.4 mg  under the tongue every 5 (five) minutes as needed for chest pain.    Yes Historical Provider, MD  omeprazole (PRILOSEC) 20 MG capsule Take 20 mg by mouth daily.   Yes Historical Provider, MD      VITAL SIGNS:  Blood pressure 141/65, pulse 80, resp. rate 16, SpO2 95 %.  PHYSICAL EXAMINATION:  GENERAL:  81 y.o.-year-old patient lying in the bed with no acute distress.  EYES: Pupils equal, round, reactive to light and accommodation. No scleral icterus. Extraocular muscles intact.  HEENT: Head atraumatic, normocephalic. Oropharynx and nasopharynx clear.  NECK:  Supple, no jugular venous distention. No thyroid enlargement, no tenderness.  LUNGS: Normal breath  sounds bilaterally, no wheezing, rales,rhonchi or crepitation. No use of accessory muscles of respiration.  CARDIOVASCULAR: S1, S2 normal. No murmurs, rubs, or gallops.  ABDOMEN: Soft, nontender, nondistended. Bowel sounds present. No organomegaly or mass.  EXTREMITIES: No pedal edema, cyanosis, or clubbing.  NEUROLOGIC: Cranial nerves II through XII are intact. Muscle strength 5/5 in all extremities. Sensation intact. Gait not checked.  PSYCHIATRIC: The patient is alert and oriented x 3.  SKIN: No obvious rash, lesion, or ulcer.   LABORATORY PANEL:   CBC  Recent Labs Lab 12/28/14 0536  WBC 6.9  HGB 13.6  HCT 40.3  PLT 164   ------------------------------------------------------------------------------------------------------------------  Chemistries   Recent Labs Lab 12/28/14 0536  NA 139  K 3.6  CL 105  CO2 26  GLUCOSE 182*  BUN 17  CREATININE 0.74  CALCIUM 9.2   ------------------------------------------------------------------------------------------------------------------  Cardiac Enzymes  Recent Labs Lab 12/28/14 0536  TROPONINI <0.03   ------------------------------------------------------------------------------------------------------------------  RADIOLOGY:  Dg Chest Port 1 View  12/28/2014   CLINICAL DATA:  Acute onset of generalized chest pressure, radiating to the back and shoulders. Vomiting. Initial encounter. EXAM: PORTABLE CHEST 1 VIEW COMPARISON:  Chest radiograph performed 11/03/2013 FINDINGS: The lungs are well-aerated and clear. There is no evidence of focal opacification, pleural effusion or pneumothorax. The cardiomediastinal silhouette is within normal limits. No acute osseous abnormalities are seen. IMPRESSION: No acute cardiopulmonary process seen. Electronically Signed   By: Garald Balding M.D.   On: 12/28/2014 06:03    EKG:  Sinus rhythm. No acute ST elevation or depression.  IMPRESSION AND PLAN:   Dymone Horsfall  is a 81 y.o. female with a known history of coronary artery disease status post PTCA in 1986, hyperlipidemia, depression/anxiety, hypertension comes to the emergency room after she started having chest discomfort around 3:00 in the morning. Patient reports eating pizza for dinner last night. Around 8:00 she started having some indigestion burning in her chest.   1. Indigestion/GERD after vomiting at 3 AM suspected due to food poisoning from eating pizza last night -Patient vomited 2 -Hemodynamically stable -We'll give her some IV fluids -IV Zofran as needed  2. Chest pain atypical suspected due to #1 have a work given history of coronary artery disease admit patient to rule out MI. -Cycle cardiac enzymes -Continue aspirin Plavix beta blockers and her statins - cardiology consultation  3. Hypertension continue home meds  4. GERD Continue PPI  5. DVT prophylaxis subcutaneous Lovenox Above was discussed with patient no family members were present.  All the records are reviewed and case discussed with ED provider. Management plans discussed with the patient, family and they are in agreement.  CODE STATUS: Full  TOTAL TIME TAKING CARE OF THIS PATIENT: 45 minutes.    Mailen Newborn M.D on 12/28/2014 at 8:17 AM  Between 7am to 6pm - Pager -  [phone]  After 6pm go to [email or web address] - password [email or web address] [facility]  [facility]  Office  [phone]  CC: Primary care physician; Mathews Argyle, MD

## 2014-12-28 NOTE — ED Notes (Signed)
Pt resting comfortably at this time.

## 2014-12-28 NOTE — Progress Notes (Signed)
Patient admitted to unit. Oriented to room, call bell, and staff. Bed in lowest position. Fall safety plan reviewed. Full assessment to Epic. Skin assessment verified with Crystal. Telemetry box verification with NT and tele clerk- Box#: [id number]. Will continue to monitor.

## 2014-12-28 NOTE — ED Notes (Signed)
Pt alert and oriented.  Waiting for admit bed. NAD. VSS.  Respirations unlabored. Skin warm dry and pink.

## 2014-12-28 NOTE — Progress Notes (Signed)
Notified Dr. Fletcher Anon (who updated Christell Faith) regarding increase in 3rd troponin. Per MD, will keep patient for possible stress test tomorrow, no need for lovenox dosing at this time. Dr. Posey Pronto updated.

## 2014-12-28 NOTE — ED Notes (Signed)
Pt's son Fritz Pickerel came to the desk and requested his phone number be added to the chart  [phone]

## 2014-12-28 NOTE — ED Provider Notes (Addendum)
[facility] Emergency Department Provider Note  ____________________________________________  Time seen: 5:35 AM  I have reviewed the triage vital signs and the nursing notes.   HISTORY  Chief Complaint Chest Pain and Emesis     HPI [name] is a 81 y.o. female presents with acute onset of central chest pain starting last at approximately 8 PM patient states that she thought it was just indigestion. Patient states that the pain radiates to upper back. Family administered aspirin at home Per EMS. EMS administered 2 sprays of nitroglycerin with complete resolution of the patient's chest pressure. Of note patient had an MRI approximately 15 years ago with cardiac stent placement    Past Medical History  Diagnosis Date  . HTN (hypertension)   . CAD (coronary artery disease)     a. 1986: s/p PTCA of bifurcation lesion in LAD;  b. 2001: s/p BMS to LAD & LCX;  c. 04/2006 Cath: diff CAD w/o critical narrowing, nl EF;  d. 06/2010 Echo: EF 60-65%, mild AI/MR.  . Carotid disease, bilateral ([place])     a. RICA [id number], LICA 123456  . Hyperlipidemia   . Macular degeneration   . Depression   . Anxiety   . Atypical carcinoid lung tumor ([place])   . Cholelithiases   . TIA (transient ischemic attack)   . Positional vertigo     benign  . Impaired fasting blood sugar   . Symptoms, such as flushing, sleeplessness, headache, lack of concentration, associated with the menopause   . Osteoarthritis   . Cataracts, bilateral   . Keloid     mid sternum  . Gilbert's syndrome   . Hyperglycemia     mild    Patient Active Problem List   Diagnosis Date Noted  . PVC's (premature ventricular contractions) 04/25/2012  . Cardiac arrest ([place])   . Left carotid stenosis 03/01/2012  . Leg fatigue 03/01/2012  . Dysuria 03/30/2011  . Mitral regurgitation 03/30/2011  . HYPERCHOLESTEROLEMIA  IIA 08/06/2008  . Carotid bruit 05/28/2008  . Essential hypertension 05/11/2008  . Coronary  atherosclerosis 05/11/2008  . Angina at rest [facility]) 05/11/2008    Past Surgical History  Procedure Laterality Date  . Pci  1986  . Carcinoid      removed from lung, burney  . Pic of lad  2001    with non des lad   . Pci of mid cfx      with non des and cutting balloon pci of av circumflex  . Left heart catheterization with coronary angiogram N/A 04/27/2012    Procedure: LEFT HEART CATHETERIZATION WITH CORONARY ANGIOGRAM;  Surgeon: Peter M Martinique, MD;  Location: [facility] CATH LAB;  Service: Cardiovascular;  Laterality: N/A;    Current Outpatient Rx  Name  Route  Sig  Dispense  Refill  . amLODipine (NORVASC) 10 MG tablet   Oral   Take 10 mg by mouth daily.         Marland Kitchen aspirin 81 MG tablet   Oral   Take 81 mg by mouth daily.         Marland Kitchen atorvastatin (LIPITOR) 20 MG tablet   Oral   Take 20 mg by mouth daily.         . Calcium Carbonate-Vitamin D (CALCIUM 600 + D PO)   Oral   Take 1 tablet by mouth daily. 600 mg/ 400 mg         . clopidogrel (PLAVIX) 75 MG tablet   Oral  Take 75 mg by mouth daily.         Marland Kitchen estrogens, conjugated, (PREMARIN) 0.3 MG tablet   Oral   Take 0.3 mg by mouth daily. Take daily for 21 days then do not take for 7 days.         . furosemide (LASIX) 20 MG tablet   Oral   Take 20 mg by mouth daily.         Marland Kitchen KLOR-CON M20 20 MEQ tablet      TAKE 1 TABLET (20 MEQ TOTAL) BY MOUTH 2 (TWO) TIMES DAILY.   90 tablet   3   . LORazepam (ATIVAN) 0.5 MG tablet   Oral   Take 0.5 mg by mouth every 8 (eight) hours as needed for anxiety.          Marland Kitchen losartan (COZAAR) 100 MG tablet      TAKE 1 TABLET (100 MG TOTAL) BY MOUTH DAILY.   90 tablet   3   . metoprolol succinate (TOPROL-XL) 50 MG 24 hr tablet   Oral   Take 1 tablet (50 mg total) by mouth daily. Take with or immediately following a meal.   90 tablet   3   . Multiple Vitamin (MULTIVITAMIN) tablet   Oral   Take 1 tablet by mouth daily.         . nitroGLYCERIN (NITROSTAT) 0.4 MG SL  tablet   Sublingual   Place 0.4 mg under the tongue every 5 (five) minutes as needed for chest pain.            Allergies Biaxin; Celecoxib; Codeine; Lisinopril; Macrobid; Oxycodone hcl; Penicillins; and Sertraline hcl  Family History  Problem Relation Age of Onset  . Diabetes Mellitus I Mother   . CAD Mother   . CAD Father   . Diabetes Mellitus I Sister   . Diabetes Mellitus I Brother   . Diabetes Mellitus I Sister     Social History Social History  Substance Use Topics  . Smoking status: Never Smoker   . Smokeless tobacco: None  . Alcohol Use: No    Review of Systems  Constitutional: Negative for fever. Eyes: Negative for visual changes. ENT: Negative for sore throat. Cardiovascular: Positive for chest pain. Respiratory: Negative for shortness of breath. Gastrointestinal: Negative for abdominal pain, vomiting and diarrhea. Genitourinary: Negative for dysuria. Musculoskeletal: Negative for back pain. Skin: Negative for rash. Neurological: Negative for headaches, focal weakness or numbness.   10-point ROS otherwise negative.  ____________________________________________   PHYSICAL EXAM:  VITAL SIGNS: ED Triage Vitals  Enc Vitals Group     BP 12/28/14 0534 136/61 mmHg     Pulse Rate 12/28/14 0534 90     Resp 12/28/14 0534 20     Temp --      Temp src --      SpO2 12/28/14 0534 96 %     Weight --      Height --      Head Cir --      Peak Flow --      Pain Score --      Pain Loc --      Pain Edu? --      Excl. in [place]? --      Constitutional: Alert and oriented. Well appearing and in no distress. Eyes: Conjunctivae are normal. PERRL. Normal extraocular movements. ENT   Head: Normocephalic and atraumatic.   Nose: No congestion/rhinnorhea.   Mouth/Throat: Mucous membranes are moist.   Neck:  No stridor. Hematological/Lymphatic/Immunilogical: No cervical lymphadenopathy. Cardiovascular: Normal rate, regular rhythm. Normal and symmetric  distal pulses are present in all extremities. No murmurs, rubs, or gallops. Respiratory: Normal respiratory effort without tachypnea nor retractions. Breath sounds are clear and equal bilaterally. No wheezes/rales/rhonchi. Gastrointestinal: Soft and nontender. No distention. There is no CVA tenderness. Genitourinary: deferred Musculoskeletal: Nontender with normal range of motion in all extremities. No joint effusions.  No lower extremity tenderness nor edema. Neurologic:  Normal speech and language. No gross focal neurologic deficits are appreciated. Speech is normal.  Skin:  Skin is warm, dry and intact. No rash noted. Psychiatric: Mood and affect are normal. Speech and behavior are normal. Patient exhibits appropriate insight and judgment.  ____________________________________________    LABS (pertinent positives/negatives)  Labs Reviewed  BASIC METABOLIC PANEL - Abnormal; Notable for the following:    Glucose, Bld 182 (*)    All other components within normal limits  CBC  TROPONIN I       RADIOLOGY DG Chest Port 1 View (Final result) Result time: 12/28/14 06:03:02   Final result by Rad Results In Interface (12/28/14 06:03:02)   Narrative:   CLINICAL DATA: Acute onset of generalized chest pressure, radiating to the back and shoulders. Vomiting. Initial encounter.  EXAM: PORTABLE CHEST 1 VIEW  COMPARISON: Chest radiograph performed 11/03/2013  FINDINGS: The lungs are well-aerated and clear. There is no evidence of focal opacification, pleural effusion or pneumothorax.  The cardiomediastinal silhouette is within normal limits. No acute osseous abnormalities are seen.  IMPRESSION: No acute cardiopulmonary process seen.   Electronically Signed By: Garald Balding M.D. On: 12/28/2014 06:03    ED ECG REPORT I, BROWN,  N, the attending physician, personally viewed and interpreted this ECG.   Date: 01/25/2015  EKG Time: 11:33 PM  Rate: 87  Rhythm:  Normal sinus rhythm with premature ventricular contraction  Axis: None  Intervals: Normal  ST&T Change: None    INITIAL IMPRESSION / ASSESSMENT AND PLAN / ED COURSE  Pertinent labs & imaging results that were available during my care of the patient were reviewed by me and considered in my medical decision making (see chart for details).    ____________________________________________   FINAL CLINICAL IMPRESSION(S) / ED DIAGNOSES  Final diagnoses:  Chest pain, unspecified chest pain type      Gregor Hams, MD 12/28/14 [place], MD 01/25/15 [phone]

## 2014-12-28 NOTE — Consult Note (Signed)
Cardiology Consultation Note  Patient ID: [name], MRN: [id number], DOB/AGE: 03-16-1930 81 y.o. Admit date: 12/28/2014   Date of Consult: 12/28/2014 Primary Physician: Mathews Argyle, MD Primary Cardiologist: Dr. Rockey Situ, MD  Chief Complaint: Indigestion/chest burning Reason for Consult: Chest pain  HPI: 81 year old female with history of CAD s/p multiple PCI's below, bilateral carotid artery disease, frequent PVCs, HTN, HLD, TIA, chronic chest pain, anxiety, and depression who presented to [facility] overnight after developing indigestion s/p pizza leading to nausea and vomiting. Cardiology is consulted for burning in the chest.   She underwent 3 vessel PTCA at [facility] in 1986 followed by PCI of the LAD with BMS in 2001, and PCI of mid LCx with BMS and cutting balloon. She most recently underwent cardiac catherization in April 2014 in the setting of an abnormal nuclear stress test that was done for abdominal discomfort in the setting of ventricular bigeminy. Cardiac cath showed underlying 3 vessel CAD with occluded proximal RCA with good left to right collaterals. This was a new finding since her most recent cardiac cath. The stents in the LAD and LCx were patent with no significant restenosis. She had normal LV systolic function. Details are below. She was having frequent PVCs and was already on a beta blocker. She has declined further medical management of her PVCs. She was hospitalized in 10/2013 for chest pain with a negative work up. Stress test was offered, but she declined. She was noted to be hypertensive at that time and her losartan was increased. Most recent echo from 10/2013 showed EF of 60-65%, DD, mild LVH, mild TR, mildly elevated right-sided pressures. She has had issues with leg fatigue that have prevented her from starting a regular exercise regimen, though this has been encuraged.   She presented to [facility] on 12/16 after eating some pizza and developing some indigestion leading to  nausea and vomiting x 2, with her last episode occuring upon EMS arrival. She typically eats thin crust pizza, however this time she ate hand tossed pizza. She is not certain if this played a role in her symptoms or not. She has also had a loose stool around 3 PM on 12/15, but no frank diarrhea. No associated SOB, diaphoresis, palpitations, presyncope, or syncope. She attempted to lay down however she developed a burning in her chest prompting her to come to thet ED for further evaluation.   Upon her arrival to [facility] she was found to have troponin negative x 1, K+ 3.6, SCr 0.74, glucose 182, unremarkable CBC, CXR with no acute cardiopulmonary process seen, ECG. She was started on IV fluids and PRN Zofran. She was continued on aspirin, Plavix, and beta blocker. She has been asymptomatic since her arrival.     Past Medical History  Diagnosis Date  . HTN (hypertension)   . CAD (coronary artery disease)     a. 1986: s/p PTCA of bifurcation lesion in LAD;  b. 2001: s/p BMS to LAD & LCX;  c. 04/2006 Cath: diff CAD w/o critical narrowing, nl EF;  d. 06/2010 Echo: EF 60-65%, mild AI/MR.  . Carotid disease, bilateral ([place])     a. RICA [id number], LICA [id number]  . Hyperlipidemia   . Macular degeneration   . Depression   . Anxiety   . Atypical carcinoid lung tumor ([place])   . Cholelithiases   . TIA (transient ischemic attack)   . Positional vertigo     benign  . Impaired fasting blood sugar   . Symptoms, such  as flushing, sleeplessness, headache, lack of concentration, associated with the menopause   . Osteoarthritis   . Cataracts, bilateral   . Keloid     mid sternum  . Gilbert's syndrome   . Hyperglycemia     mild      Most Recent Cardiac Studies: Echo 10/2013: Technically difficult study due to poor windows, EF 123456, diastolic relaxation abnormality noted, mild LVH, normal RV size and systolic function, mild TR, mildly elevated PASP.   Cardiac cath 04/2012: Procedural  Findings:  Hemodynamics: AO: 127/48 mmHg LV: 130/8 mmHg LVEDP: 13 mmHg  Coronary angiography: Coronary dominance: Right   Left Main: Mildly calcified with no significant disease.  Left Anterior Descending (LAD): Significantly calcified especially in the proximal and mid segments. The stent is noted proximally with no significant restenosis. In the midsegment, there is diffuse 30-40% disease. The distal segment has minor irregularities.  1st diagonal (D1): Very small in size and diffusely diseased.  2nd diagonal (D2): Small in size with minor irregularities.  3rd diagonal (D3): Very small in size.  Circumflex (LCx): Normal in size and nondominant. The vessel is moderately calcified. In the midsegment, there appears to be a stent with diffuse 20% in-stent restenosis.  1st obtuse marginal: Small in size with minor irregularities.  2nd obtuse marginal: Medium in size with mild ostial disease.  3rd obtuse marginal: Normal in size with minor irregularities.  Right Coronary Artery: Normal in size and dominant. The vessel is occluded proximally. It fills via very good left to right collaterals.  Left ventriculography: Left ventricular systolic function is normal , LVEF is estimated at 65 %, there is mild significant mitral regurgitation   Final Conclusions:  1. Underlying three-vessel coronary artery disease with occluded proximal right coronary artery with good left to right collaterals. This is new since the most recent cardiac catheterization. The stents in the LAD and left circumflex artery are patent with no significant restenosis. 2. Normal LV systolic function.  Recommendations:  Medical therapy. She is having frequent PVCs but is already on a beta blocker. She might require a Holter monitor to quantify the PVCs in a 24-hour period and determine if further treatment is warranted.   Surgical History:  Past Surgical History  Procedure Laterality Date  .  Pci  1986  . Carcinoid      removed from lung, [place]  . Pic of lad  2001    with non des lad   . Pci of mid cfx      with non des and cutting balloon pci of av circumflex  . Left heart catheterization with coronary angiogram N/A 04/27/2012    Procedure: LEFT HEART CATHETERIZATION WITH CORONARY ANGIOGRAM;  Surgeon: Peter M Martinique, MD;  Location: [facility] CATH LAB;  Service: Cardiovascular;  Laterality: N/A;     Home Meds: Prior to Admission medications   Medication Sig Start Date End Date Taking? Authorizing Provider  amLODipine (NORVASC) 10 MG tablet Take 10 mg by mouth daily.   Yes Historical Provider, MD  aspirin 81 MG tablet Take 81 mg by mouth daily.   Yes Historical Provider, MD  atorvastatin (LIPITOR) 20 MG tablet Take 20 mg by mouth daily.   Yes Historical Provider, MD  Calcium Carbonate-Vitamin D (CALCIUM 600 + D PO) Take 1 tablet by mouth daily. 600 mg/ 400 mg   Yes Historical Provider, MD  clopidogrel (PLAVIX) 75 MG tablet Take 75 mg by mouth daily.   Yes Historical Provider, MD  estrogens, conjugated, (PREMARIN) 0.3 MG  tablet Take 0.3 mg by mouth daily. Take daily for 21 days then do not take for 7 days.   Yes Historical Provider, MD  furosemide (LASIX) 20 MG tablet Take 20 mg by mouth daily.   Yes Historical Provider, MD  KLOR-CON M20 20 MEQ tablet TAKE 1 TABLET (20 MEQ TOTAL) BY MOUTH 2 (TWO) TIMES DAILY. 08/17/14  Yes Minna Merritts, MD  LORazepam (ATIVAN) 0.5 MG tablet Take 0.5 mg by mouth every 8 (eight) hours as needed for anxiety.    Yes Historical Provider, MD  losartan (COZAAR) 100 MG tablet TAKE 1 TABLET (100 MG TOTAL) BY MOUTH DAILY. 08/30/14  Yes Minna Merritts, MD  metoprolol succinate (TOPROL-XL) 50 MG 24 hr tablet Take 1 tablet (50 mg total) by mouth daily. Take with or immediately following a meal. 08/15/14  Yes Minna Merritts, MD  Multiple Vitamin (MULTIVITAMIN) tablet Take 1 tablet by mouth daily.   Yes Historical Provider, MD  nitroGLYCERIN (NITROSTAT) 0.4 MG SL  tablet Place 0.4 mg under the tongue every 5 (five) minutes as needed for chest pain.    Yes Historical Provider, MD  omeprazole (PRILOSEC) 20 MG capsule Take 20 mg by mouth daily.   Yes Historical Provider, MD    Inpatient Medications:    . sodium chloride 100 mL/hr at 12/28/14 0834    Allergies:  Allergies  Allergen Reactions  . Biaxin [Clarithromycin]     depressed  . Celecoxib Other (See Comments)    Reaction: Unknown  . Codeine Other (See Comments)    Reaction: Unknown  . Lisinopril     cough  . Macrobid [employer] Macro] Other (See Comments)    Reaction: Unknown  . Oxycodone Hcl Other (See Comments)    Reaction: Unknown  . Penicillins Other (See Comments)    Reaction: Unknown  . Sertraline Hcl Other (See Comments)    Reaction: Unknown    Social History   Social History  . Marital Status: Divorced    Spouse Name: N/A  . Number of Children: N/A  . Years of Education: N/A   Occupational History  . retired    Social History Main Topics  . Smoking status: Never Smoker   . Smokeless tobacco: Not on file  . Alcohol Use: No  . Drug Use: No  . Sexual Activity: Not on file   Other Topics Concern  . Not on file   Social History Narrative     Family History  Problem Relation Age of Onset  . Diabetes Mellitus I Mother   . CAD Mother   . CAD Father   . Diabetes Mellitus I Sister   . Diabetes Mellitus I Brother   . Diabetes Mellitus I Sister      Review of Systems: Review of Systems  Constitutional: Positive for malaise/fatigue. Negative for fever, chills, weight loss and diaphoresis.  HENT: Negative for congestion.   Eyes: Negative for discharge and redness.  Respiratory: Negative for cough, hemoptysis, sputum production, shortness of breath and wheezing.   Cardiovascular: Positive for chest pain. Negative for palpitations, orthopnea, claudication, leg swelling and PND.       Burning sensation  Gastrointestinal: Positive for heartburn,  nausea and vomiting. Negative for abdominal pain, diarrhea, constipation, blood in stool and melena.       Emesis x 2, last episode with EMS  Genitourinary: Negative for hematuria.  Musculoskeletal: Negative for myalgias and falls.  Skin: Negative for rash.  Neurological: Negative for dizziness, tingling, tremors, sensory change,  speech change, focal weakness, loss of consciousness and weakness.  Endo/Heme/Allergies: Does not bruise/bleed easily.  Psychiatric/Behavioral: Negative for substance abuse. The patient is nervous/anxious.   All other systems reviewed and are negative.    Labs:  Recent Labs  12/28/14 0536  TROPONINI <0.03   Lab Results  Component Value Date   WBC 6.9 12/28/2014   HGB 13.6 12/28/2014   HCT 40.3 12/28/2014   MCV 88.7 12/28/2014   PLT 164 12/28/2014    Recent Labs Lab 12/28/14 0536  NA 139  K 3.6  CL 105  CO2 26  BUN 17  CREATININE 0.74  CALCIUM 9.2  GLUCOSE 182*   Lab Results  Component Value Date   CHOL 136 11/04/2013   HDL 56 11/04/2013   LDLCALC 14 11/04/2013   TRIG 329* 11/04/2013   No results found for: DDIMER  Radiology/Studies:  Dg Chest Port 1 View  12/28/2014  CLINICAL DATA:  Acute onset of generalized chest pressure, radiating to the back and shoulders. Vomiting. Initial encounter. EXAM: PORTABLE CHEST 1 VIEW COMPARISON:  Chest radiograph performed 11/03/2013 FINDINGS: The lungs are well-aerated and clear. There is no evidence of focal opacification, pleural effusion or pneumothorax. The cardiomediastinal silhouette is within normal limits. No acute osseous abnormalities are seen. IMPRESSION: No acute cardiopulmonary process seen. Electronically Signed   By: Garald Balding M.D.   On: 12/28/2014 06:03    EKG: NSR, 82 bpm, baseline artifact I, nonspecific st/t changes inferior and anterolateral leads   Weights: There were no vitals filed for this visit.   Physical Exam: Blood pressure 142/68, pulse 79, resp. rate 17, SpO2 95  %. There is no weight on file to calculate BMI. General: Well developed, well nourished, in no acute distress. Head: Normocephalic, atraumatic, sclera non-icteric, no xanthomas, nares are without discharge.  Neck: Negative for carotid bruits. JVD not elevated. Lungs: Clear bilaterally to auscultation without wheezes, rales, or rhonchi. Breathing is unlabored. Heart: RRR with S1 S2. II/VI systolic murmur RUSB. No rubs, or gallops appreciated. Abdomen: Soft, non-tender, non-distended with normoactive bowel sounds. No hepatomegaly. No rebound/guarding. No obvious abdominal masses. Msk:  Strength and tone appear normal for age. Extremities: No clubbing or cyanosis. No edema.  Distal pedal pulses are 2+ and equal bilaterally. Neuro: Alert and oriented X 3. No facial asymmetry. No focal deficit. Moves all extremities spontaneously. Psych:  Responds to questions appropriately with a normal affect.    Assessment and Plan:   1. Atypical chest pain: -Troponin negative x 1 thus far, continue to cycle to rule out -If she rules out could plan for short term outpatient follow up in our clinic -If she rules in would need to stay for inpatient work up -Echo is pending to evaluate LV function, wall motion, and right-sided, if she rules out could possibly be done as an outpatient  -Possibly in the setting of her indigestion/GERD -Asymptomatic   2. Indigestion/GERD: -Ate different style of pizza -Has started Prilosec from PCP, which has helped -Improved -Per IM  3. CAD s/p multiple PCI as above: -Continue aspirin 81 and Plavix 75 mg daily -Continue Toprol XL 50 mg daily -As above  4. HTN: -Losartan 100 mg daily, Toprol XL 50 mg daily  5. HLD: -Lipitor     Signed, Christell Faith, PA-C Pager: [phone] 12/28/2014, 9:02 AM

## 2014-12-29 ENCOUNTER — Observation Stay (HOSPITAL_BASED_OUTPATIENT_CLINIC_OR_DEPARTMENT_OTHER): Payer: Medicare Other

## 2014-12-29 DIAGNOSIS — I10 Essential (primary) hypertension: Secondary | ICD-10-CM | POA: Diagnosis present

## 2014-12-29 DIAGNOSIS — A059 Bacterial foodborne intoxication, unspecified: Secondary | ICD-10-CM | POA: Diagnosis present

## 2014-12-29 DIAGNOSIS — Z880 Allergy status to penicillin: Secondary | ICD-10-CM | POA: Diagnosis not present

## 2014-12-29 DIAGNOSIS — Z886 Allergy status to analgesic agent status: Secondary | ICD-10-CM | POA: Diagnosis not present

## 2014-12-29 DIAGNOSIS — H3530 Unspecified macular degeneration: Secondary | ICD-10-CM | POA: Diagnosis present

## 2014-12-29 DIAGNOSIS — I200 Unstable angina: Secondary | ICD-10-CM | POA: Diagnosis not present

## 2014-12-29 DIAGNOSIS — K219 Gastro-esophageal reflux disease without esophagitis: Secondary | ICD-10-CM | POA: Diagnosis present

## 2014-12-29 DIAGNOSIS — F419 Anxiety disorder, unspecified: Secondary | ICD-10-CM | POA: Diagnosis present

## 2014-12-29 DIAGNOSIS — I25111 Atherosclerotic heart disease of native coronary artery with angina pectoris with documented spasm: Secondary | ICD-10-CM | POA: Diagnosis not present

## 2014-12-29 DIAGNOSIS — R112 Nausea with vomiting, unspecified: Secondary | ICD-10-CM | POA: Diagnosis not present

## 2014-12-29 DIAGNOSIS — I214 Non-ST elevation (NSTEMI) myocardial infarction: Secondary | ICD-10-CM | POA: Diagnosis present

## 2014-12-29 DIAGNOSIS — Z79899 Other long term (current) drug therapy: Secondary | ICD-10-CM | POA: Diagnosis not present

## 2014-12-29 DIAGNOSIS — K529 Noninfective gastroenteritis and colitis, unspecified: Secondary | ICD-10-CM | POA: Diagnosis not present

## 2014-12-29 DIAGNOSIS — Z7982 Long term (current) use of aspirin: Secondary | ICD-10-CM | POA: Diagnosis not present

## 2014-12-29 DIAGNOSIS — I209 Angina pectoris, unspecified: Secondary | ICD-10-CM | POA: Diagnosis not present

## 2014-12-29 DIAGNOSIS — Z955 Presence of coronary angioplasty implant and graft: Secondary | ICD-10-CM | POA: Diagnosis not present

## 2014-12-29 DIAGNOSIS — R079 Chest pain, unspecified: Secondary | ICD-10-CM | POA: Diagnosis not present

## 2014-12-29 DIAGNOSIS — I340 Nonrheumatic mitral (valve) insufficiency: Secondary | ICD-10-CM | POA: Diagnosis present

## 2014-12-29 DIAGNOSIS — H269 Unspecified cataract: Secondary | ICD-10-CM | POA: Diagnosis present

## 2014-12-29 DIAGNOSIS — F329 Major depressive disorder, single episode, unspecified: Secondary | ICD-10-CM | POA: Diagnosis present

## 2014-12-29 DIAGNOSIS — I493 Ventricular premature depolarization: Secondary | ICD-10-CM | POA: Diagnosis present

## 2014-12-29 DIAGNOSIS — Z888 Allergy status to other drugs, medicaments and biological substances status: Secondary | ICD-10-CM | POA: Diagnosis not present

## 2014-12-29 DIAGNOSIS — Z833 Family history of diabetes mellitus: Secondary | ICD-10-CM | POA: Diagnosis not present

## 2014-12-29 DIAGNOSIS — M1990 Unspecified osteoarthritis, unspecified site: Secondary | ICD-10-CM | POA: Diagnosis present

## 2014-12-29 DIAGNOSIS — Z8673 Personal history of transient ischemic attack (TIA), and cerebral infarction without residual deficits: Secondary | ICD-10-CM | POA: Diagnosis not present

## 2014-12-29 DIAGNOSIS — Z8249 Family history of ischemic heart disease and other diseases of the circulatory system: Secondary | ICD-10-CM | POA: Diagnosis not present

## 2014-12-29 DIAGNOSIS — K30 Functional dyspepsia: Secondary | ICD-10-CM | POA: Diagnosis present

## 2014-12-29 DIAGNOSIS — Z9889 Other specified postprocedural states: Secondary | ICD-10-CM | POA: Diagnosis not present

## 2014-12-29 DIAGNOSIS — E785 Hyperlipidemia, unspecified: Secondary | ICD-10-CM | POA: Diagnosis present

## 2014-12-29 LAB — NM MYOCAR MULTI W/SPECT W/WALL MOTION / EF
CHL CUP NUCLEAR SRS: 10
CHL CUP NUCLEAR SSS: 10
LVDIAVOL: 71 mL
LVSYSVOL: 25 mL
NUC STRESS TID: 1.08
SDS: 0

## 2014-12-29 LAB — TROPONIN I
Troponin I: 0.25 ng/mL — ABNORMAL HIGH (ref <0.031)
Troponin I: 0.18 ng/mL — ABNORMAL HIGH (ref <0.031)

## 2014-12-29 MED ORDER — TECHNETIUM TC 99M SESTAMIBI - CARDIOLITE
13.9410 | Freq: Once | INTRAVENOUS | Status: AC | PRN
  Administered 2014-12-29: 13.941 via INTRAVENOUS

## 2014-12-29 MED ORDER — NITROGLYCERIN 0.4 MG SL SUBL
0.4000 mg | SUBLINGUAL_TABLET | SUBLINGUAL | Status: DC | PRN
  Administered 2014-12-29: 0.4 mg via SUBLINGUAL

## 2014-12-29 MED ORDER — TECHNETIUM TC 99M SESTAMIBI - CARDIOLITE
31.9190 | Freq: Once | INTRAVENOUS | Status: AC | PRN
  Administered 2014-12-29: 31.919 via INTRAVENOUS

## 2014-12-29 MED ORDER — REGADENOSON 0.4 MG/5ML IV SOLN
0.4000 mg | Freq: Once | INTRAVENOUS | Status: AC
  Administered 2014-12-29: 0.4 mg via INTRAVENOUS

## 2014-12-29 NOTE — Progress Notes (Signed)
[place] [facility] [place] NAME: [name]    MR#:  [id number]  DATE OF BIRTH:  December 25, 1930  SUBJECTIVE:  Doing well. Cp during stress test  REVIEW OF SYSTEMS:   Review of Systems  Constitutional: Negative for fever, chills and weight loss.  HENT: Negative for ear discharge, ear pain and nosebleeds.   Eyes: Negative for blurred vision, pain and discharge.  Respiratory: Negative for sputum production, shortness of breath, wheezing and stridor.   Cardiovascular: Positive for chest pain. Negative for palpitations, orthopnea and PND.  Gastrointestinal: Negative for nausea, vomiting, abdominal pain and diarrhea.  Genitourinary: Negative for urgency and frequency.  Musculoskeletal: Negative for back pain and joint pain.  Neurological: Positive for weakness. Negative for sensory change, speech change and focal weakness.  Psychiatric/Behavioral: Negative for depression and hallucinations. The patient is not nervous/anxious.   All other systems reviewed and are negative.   Tolerating PT:not needed Tolerating diet: yes  DRUG ALLERGIES:   Allergies  Allergen Reactions  . Biaxin [Clarithromycin]     depressed  . Celecoxib Other (See Comments)    Reaction: Unknown  . Codeine Other (See Comments)    Reaction: Unknown  . Lisinopril     cough  . Macrobid [employer] Macro] Other (See Comments)    Reaction: Unknown  . Oxycodone Hcl Other (See Comments)    Reaction: Unknown  . Penicillins Other (See Comments)    Reaction: Unknown  . Sertraline Hcl Other (See Comments)    Reaction: Unknown    VITALS:  Blood pressure 131/61, pulse 75, temperature 99 F (37.2 C), temperature source Oral, resp. rate 20, height 5' 6.5" (1.689 m), weight 180 lb 9.6 oz (81.92 kg), SpO2 93 %.  PHYSICAL EXAMINATION:  GENERAL:  81 y.o.-year-old patient lying in the bed with no acute distress.  EYES: Pupils equal, round, reactive to light and  accommodation. No scleral icterus. Extraocular muscles intact.  HEENT: Head atraumatic, normocephalic. Oropharynx and nasopharynx clear.  NECK:  Supple, no jugular venous distention. No thyroid enlargement, no tenderness.  LUNGS: Normal breath sounds bilaterally, no wheezing, rales,rhonchi or crepitation. No use of accessory muscles of respiration.  CARDIOVASCULAR: S1, S2 normal. No murmurs, rubs, or gallops.  ABDOMEN: Soft, nontender, nondistended. Bowel sounds present. No organomegaly or mass.  EXTREMITIES: No pedal edema, cyanosis, or clubbing.  NEUROLOGIC: Cranial nerves II through XII are intact. Muscle strength 5/5 in all extremities. Sensation intact. Gait not checked.  PSYCHIATRIC: The patient is alert and oriented x 3.  SKIN: No obvious rash, lesion, or ulcer.    LABORATORY PANEL:   CBC  Recent Labs Lab 12/28/14 0536  WBC 6.9  HGB 13.6  HCT 40.3  PLT 164   ------------------------------------------------------------------------------------------------------------------  Chemistries   Recent Labs Lab 12/28/14 0536  NA 139  K 3.6  CL 105  CO2 26  GLUCOSE 182*  BUN 17  CREATININE 0.74  CALCIUM 9.2   ------------------------------------------------------------------------------------------------------------------  Cardiac Enzymes  Recent Labs Lab 12/28/14 2313 12/29/14 0507  TROPONINI 0.25* 0.18*   ------------------------------------------------------------------------------------------------------------------  RADIOLOGY:  Nm Myocar Multi W/spect W/wall Motion / Ef  12/29/2014   Defect 1: There is a small defect of mild severity present in the basal inferior location.  Downsloping ST segment depression ST segment depression of 3 mm was noted during stress in the II, III, aVF, V4, V5 and V6 leads.  Findings consistent with mild ischemia in the basal inferior wall.  This is a low risk study  given that the ischemia is only mild and small distribution.   The left ventricular ejection fraction is normal (55-65%).    Dg Chest Port 1 View  12/28/2014  CLINICAL DATA:  Acute onset of generalized chest pressure, radiating to the back and shoulders. Vomiting. Initial encounter. EXAM: PORTABLE CHEST 1 VIEW COMPARISON:  Chest radiograph performed 11/03/2013 FINDINGS: The lungs are well-aerated and clear. There is no evidence of focal opacification, pleural effusion or pneumothorax. The cardiomediastinal silhouette is within normal limits. No acute osseous abnormalities are seen. IMPRESSION: No acute cardiopulmonary process seen. Electronically Signed   By: Garald Balding M.D.   On: 12/28/2014 06:03   ASSESSMENT AND PLAN:   [name] is a 81 y.o. female with a known history of coronary artery disease status post PTCA in 1986, hyperlipidemia, depression/anxiety, hypertension comes to the emergency room after she started having chest discomfort around 3:00 in the morning. Patient reports eating pizza for dinner last night. Around 8:00 she started having some indigestion burning in her chest.   1. Indigestion/GERD after vomiting at 3 AM suspected due to food poisoning from eating pizza last night -Patient vomited 2 -Hemodynamically stable -We'll give her some IV fluids -IV Zofran as needed   2. Unstable angina with postive troponin and stress tes Rule in for NSTEMI -Cycle cardiac enzymes elevated -Continue aspirin Plavix beta blockers and her statins - cardiology consultation appreciated. No need for lovenox treatment dose -cath on monday  3. Hypertension continue home meds  4. GERD Continue PPI  5. DVT prophylaxis subcutaneous Lovenox Above was discussed with patient no family members were present.      All the records are reviewed and case discussed with Care Management/Social Workerr. Management plans discussed with the patient, family and they are in agreement.  CODE STATUS: full  TOTAL TIME TAKING CARE OF THIS PATIENT: 30 minutes.    POSSIBLE D/C IN 1-2 DAYS, DEPENDING ON CLINICAL CONDITION.   Tison Leibold M.D on 12/29/2014 at 1:37 PM  Between 7am to 6pm - Pager - [phone]  After 6pm go to [email or web address] - password [email or web address] [facility]  [facility]  Office  [phone]  CC: Primary care physician; Mathews Argyle, MD

## 2014-12-29 NOTE — Progress Notes (Signed)
SUBJECTIVE: The patient is doing well today.  At this time, she denies chest pain, shortness of breath, or any new concerns. Had stress test done today with Lexiscan. Had chest pain and ST changes during the stress test and received 2 doses of nitroglycerin with improvement in her symptoms. We'll have imaging done and read out later today. Otherwise feeling well.  Marland Kitchen amLODipine  10 mg Oral Daily  . aspirin  81 mg Oral Daily  . atorvastatin  20 mg Oral Daily  . calcium-vitamin D  1 tablet Oral q morning - 10a  . clopidogrel  75 mg Oral Daily  . enoxaparin (LOVENOX) injection  40 mg Subcutaneous Q24H  . furosemide  20 mg Oral Daily  . losartan  100 mg Oral Daily  . metoprolol succinate  50 mg Oral Daily  . multivitamin with minerals  1 tablet Oral Daily  . pantoprazole  40 mg Oral Daily  . potassium chloride SA  20 mEq Oral BID   . sodium chloride 100 mL/hr at 12/29/14 0618    OBJECTIVE: Physical Exam: Filed Vitals:   12/28/14 1146 12/28/14 1825 12/28/14 1948 12/29/14 0434  BP: 120/50 148/57 134/50 131/55  Pulse: 71 74 65 67  Temp: 98.2 F (36.8 C) 99.1 F (37.3 C) 99.5 F (37.5 C) 98.8 F (37.1 C)  TempSrc: Oral Oral Oral Oral  Resp: 18 18 16 16   Height: 5' 6.5" (1.689 m)     Weight: 180 lb 9.6 oz (81.92 kg)     SpO2: 98% 97% 93% 93%    Intake/Output Summary (Last 24 hours) at 12/29/14 1025 Last data filed at 12/29/14 0900  Gross per 24 hour  Intake 2413.33 ml  Output   1350 ml  Net 1063.33 ml    Telemetry reveals sinus rhythm  GEN- The patient is well appearing, alert and oriented x 3 today.   Head- normocephalic, atraumatic Eyes-  Sclera clear, conjunctiva pink Ears- hearing intact Oropharynx- clear Neck- supple, no JVP Lymph- no cervical lymphadenopathy Lungs- Clear to ausculation bilaterally, normal work of breathing Heart- Regular rate and rhythm, no murmurs, rubs or gallops, PMI not laterally displaced GI- soft, NT, ND, + BS Extremities- no clubbing,  cyanosis, or edema Skin- no rash or lesion Psych- euthymic mood, full affect Neuro- strength and sensation are intact  LABS: Basic Metabolic Panel:  Recent Labs  12/28/14 0536  NA 139  K 3.6  CL 105  CO2 26  GLUCOSE 182*  BUN 17  CREATININE 0.74  CALCIUM 9.2   Liver Function Tests: No results for input(s): AST, ALT, ALKPHOS, BILITOT, PROT, ALBUMIN in the last 72 hours. No results for input(s): LIPASE, AMYLASE in the last 72 hours. CBC:  Recent Labs  12/28/14 0536  WBC 6.9  HGB 13.6  HCT 40.3  MCV 88.7  PLT 164   Cardiac Enzymes:  Recent Labs  12/28/14 1658 12/28/14 2313 12/29/14 0507  TROPONINI 0.53* 0.25* 0.18*   BNP: Invalid input(s): POCBNP D-Dimer: No results for input(s): DDIMER in the last 72 hours. Hemoglobin A1C: No results for input(s): HGBA1C in the last 72 hours. Fasting Lipid Panel: No results for input(s): CHOL, HDL, LDLCALC, TRIG, CHOLHDL, LDLDIRECT in the last 72 hours. Thyroid Function Tests: No results for input(s): TSH, T4TOTAL, T3FREE, THYROIDAB in the last 72 hours.  Invalid input(s): FREET3 Anemia Panel: No results for input(s): VITAMINB12, FOLATE, FERRITIN, TIBC, IRON, RETICCTPCT in the last 72 hours.  RADIOLOGY: Dg Chest Port 1 View  12/28/2014  CLINICAL  DATA:  Acute onset of generalized chest pressure, radiating to the back and shoulders. Vomiting. Initial encounter. EXAM: PORTABLE CHEST 1 VIEW COMPARISON:  Chest radiograph performed 11/03/2013 FINDINGS: The lungs are well-aerated and clear. There is no evidence of focal opacification, pleural effusion or pneumothorax. The cardiomediastinal silhouette is within normal limits. No acute osseous abnormalities are seen. IMPRESSION: No acute cardiopulmonary process seen. Electronically Signed   By: Garald Balding M.D.   On: 12/28/2014 06:03    ASSESSMENT AND PLAN:  Active Problems:   Chest pain   Angina pectoris ([place])   Coronary artery disease involving native coronary artery of  native heart with angina pectoris with documented spasm ([facility])   Emesis   Gastroenteritis, acute 1. Atypical chest pain: -Troponin positive at 0.56 -We'll assess the need for cardiac cath depending on ischemia on her stress test. -Echo shows normal LV function. -Possibly in the setting of her indigestion/GERD  2. Indigestion/GERD: -Ate different style of pizza -Has started Prilosec from PCP, which has helped -Improved -Per IM  3. CAD s/p multiple PCI as above: -Continue aspirin 81 and Plavix 75 mg daily -Continue Toprol XL 50 mg daily -As above  4. HTN: -Losartan 100 mg daily, Toprol XL 50 mg daily  5. HLD: -Lipitor   Aidenjames Heckmann Meredith Leeds, MD 12/29/2014 10:25 AM

## 2014-12-30 DIAGNOSIS — K219 Gastro-esophageal reflux disease without esophagitis: Secondary | ICD-10-CM | POA: Diagnosis not present

## 2014-12-30 DIAGNOSIS — R112 Nausea with vomiting, unspecified: Secondary | ICD-10-CM | POA: Diagnosis not present

## 2014-12-30 DIAGNOSIS — I10 Essential (primary) hypertension: Secondary | ICD-10-CM | POA: Diagnosis not present

## 2014-12-30 DIAGNOSIS — R079 Chest pain, unspecified: Secondary | ICD-10-CM | POA: Diagnosis not present

## 2014-12-30 DIAGNOSIS — I200 Unstable angina: Secondary | ICD-10-CM | POA: Diagnosis not present

## 2014-12-30 NOTE — Progress Notes (Signed)
Pt. Discharged to home via wc. Discharge instructions and medication regimen reviewed at bedside with patient. Pt. verbalizes understanding of instructions and medication regimen. Patient assessment unchanged from this morning. TELE and IV discontinued per policy.   

## 2014-12-30 NOTE — Progress Notes (Signed)
Spoke with Dr. Curt Bears, from his perspective patient can discharge and follow-up outpatient with cardiology. Dr. Posey Pronto notified.

## 2014-12-30 NOTE — Progress Notes (Signed)
SUBJECTIVE: The patient is doing well today.  At this time, she denies chest pain, shortness of breath, or any new concerns.   Stress test done yesterday with significant chest pain and shortness of breath with Lexiscan as well as significant ECG changes.  Patient felt improved with administration of NTG x2.   Marland Kitchen amLODipine  10 mg Oral Daily  . aspirin  81 mg Oral Daily  . atorvastatin  20 mg Oral Daily  . calcium-vitamin D  1 tablet Oral q morning - 10a  . clopidogrel  75 mg Oral Daily  . enoxaparin (LOVENOX) injection  40 mg Subcutaneous Q24H  . furosemide  20 mg Oral Daily  . losartan  100 mg Oral Daily  . metoprolol succinate  50 mg Oral Daily  . multivitamin with minerals  1 tablet Oral Daily  . pantoprazole  40 mg Oral Daily  . potassium chloride SA  20 mEq Oral BID   . sodium chloride 100 mL/hr at 12/30/14 0534    OBJECTIVE: Physical Exam: Filed Vitals:   12/29/14 1119 12/29/14 2024 12/30/14 0517 12/30/14 0935  BP: 131/61 153/65 163/57 144/60  Pulse: 75 74 76 67  Temp: 99 F (37.2 C) 99.3 F (37.4 C) 99.4 F (37.4 C)   TempSrc: Oral Oral Oral   Resp: 20 16 16    Height:      Weight:      SpO2: 93% 94% 93%     Intake/Output Summary (Last 24 hours) at 12/30/14 [id number] Last data filed at 12/30/14 [id number]  Gross per 24 hour  Intake   1440 ml  Output   3750 ml  Net  -2310 ml    Telemetry reveals sinus rhythm  GEN- The patient is well appearing, alert and oriented x 3 today.   Head- normocephalic, atraumatic Eyes-  Sclera clear, conjunctiva pink Ears- hearing intact Oropharynx- clear Neck- supple, no JVP Lymph- no cervical lymphadenopathy Lungs- Clear to ausculation bilaterally, normal work of breathing Heart- Regular rate and rhythm, no murmurs, rubs or gallops, PMI not laterally displaced GI- soft, NT, ND, + BS Extremities- no clubbing, cyanosis, or edema Skin- no rash or lesion Psych- euthymic mood, full affect Neuro- strength and sensation are  intact  Stress perfusion imaging:  Defect 1: There is a small defect of mild severity present in the basal inferior location.  Downsloping ST segment depression ST segment depression of 3 mm was noted during stress in the II, III, aVF, V4, V5 and V6 leads.  Findings consistent with mild ischemia in the basal inferior wall.  This is a low risk study given that the ischemia is only mild and small distribution.  The left ventricular ejection fraction is normal (55-65%).  LABS: Basic Metabolic Panel:  Recent Labs  12/28/14 0536  NA 139  K 3.6  CL 105  CO2 26  GLUCOSE 182*  BUN 17  CREATININE 0.74  CALCIUM 9.2   Liver Function Tests: No results for input(s): AST, ALT, ALKPHOS, BILITOT, PROT, ALBUMIN in the last 72 hours. No results for input(s): LIPASE, AMYLASE in the last 72 hours. CBC:  Recent Labs  12/28/14 0536  WBC 6.9  HGB 13.6  HCT 40.3  MCV 88.7  PLT 164   Cardiac Enzymes:  Recent Labs  12/28/14 1658 12/28/14 2313 12/29/14 0507  TROPONINI 0.53* 0.25* 0.18*   BNP: Invalid input(s): POCBNP D-Dimer: No results for input(s): DDIMER in the last 72 hours. Hemoglobin A1C: No results for input(s): HGBA1C in the last 72  hours. Fasting Lipid Panel: No results for input(s): CHOL, HDL, LDLCALC, TRIG, CHOLHDL, LDLDIRECT in the last 72 hours. Thyroid Function Tests: No results for input(s): TSH, T4TOTAL, T3FREE, THYROIDAB in the last 72 hours.  Invalid input(s): FREET3 Anemia Panel: No results for input(s): VITAMINB12, FOLATE, FERRITIN, TIBC, IRON, RETICCTPCT in the last 72 hours.  RADIOLOGY: Nm Myocar Multi W/spect W/wall Motion / Ef  12/29/2014   Defect 1: There is a small defect of mild severity present in the basal inferior location.  Downsloping ST segment depression ST segment depression of 3 mm was noted during stress in the II, III, aVF, V4, V5 and V6 leads.  Findings consistent with mild ischemia in the basal inferior wall.  This is a low risk  study given that the ischemia is only mild and small distribution.  The left ventricular ejection fraction is normal (55-65%).    Dg Chest Port 1 View  12/28/2014  CLINICAL DATA:  Acute onset of generalized chest pressure, radiating to the back and shoulders. Vomiting. Initial encounter. EXAM: PORTABLE CHEST 1 VIEW COMPARISON:  Chest radiograph performed 11/03/2013 FINDINGS: The lungs are well-aerated and clear. There is no evidence of focal opacification, pleural effusion or pneumothorax. The cardiomediastinal silhouette is within normal limits. No acute osseous abnormalities are seen. IMPRESSION: No acute cardiopulmonary process seen. Electronically Signed   By: Garald Balding M.D.   On: 12/28/2014 06:03    ASSESSMENT AND PLAN:  Active Problems:   Chest pain   Angina pectoris ([place])   Coronary artery disease involving native coronary artery of native heart with angina pectoris with documented spasm (H[facility])   Emesis   Gastroenteritis, acute   NSTEMI (non-ST elevated myocardial infarction) ([place]) 1. Atypical chest pain with demand ischemia: -Troponin positive at 0.56 -Stress testing of low risk with small reversible defect in the basal inferior segment.  Patient with known occlusion of RCA. -Echo shows normal LV function. -Possibly in the setting of her indigestion/GERD -I discussed options with the patient today based on her low risk study. She has a known occluded RCA and her perfusion defect is in that area. It is possible that her symptoms during test as well as her EKG changes are due to ischemia in that area as well.due to that I have told her that it is safe to be discharged with close follow-up with her cardiologist. I discussed this with her cardiologist yesterday. She may require heart catheterization but this does not need to be done as an inpatient. It is likely that her elevated troponin is from demand ischemia.  2. Indigestion/GERD: -Has started Prilosec from PCP, which has  helped -Improved -Per IM  3. CAD s/p multiple PCI as above: -Continue aspirin 81 and Plavix 75 mg daily -Continue Toprol XL 50 mg daily  4. HTN: -Losartan 100 mg daily, Toprol XL 50 mg daily  5. HLD: -Lipitor   Will Meredith Leeds, MD 12/30/2014 9:38 AM

## 2014-12-30 NOTE — Discharge Summary (Signed)
[facility] [place] NAME: [name]    MR#:  [id number]  DATE OF BIRTH:  1930-12-29  DATE OF ADMISSION:  12/28/2014 ADMITTING PHYSICIAN: Fritzi Mandes, MD  DATE OF DISCHARGE: 12/30/14  PRIMARY CARE PHYSICIAN: Mathews Argyle, MD    ADMISSION DIAGNOSIS:  Chest pain, unspecified chest pain type [R07.9]  DISCHARGE DIAGNOSIS:  1.Intractable nausea vomiting secondary to food indigestion/poisoning resolved. 2.Unstable angina/NSTEMI with elevated troponin in the setting of history of coronary artery disease. Workup for cardiology to be done as outpatient.   SECONDARY DIAGNOSIS:   Past Medical History  Diagnosis Date  . HTN (hypertension)   . CAD (coronary artery disease)     a. 1986: s/p PTCA of bifurcation lesion in LAD;  b. 2001: s/p BMS to LAD & LCX;  c. 04/2006 Cath: diff CAD w/o critical narrowing, nl EF;  d. 06/2010 Echo: EF 60-65%, mild AI/MR.  . Carotid disease, bilateral ([place])     a. RICA [id number], LICA [id number]  . Hyperlipidemia   . Macular degeneration   . Depression   . Anxiety   . Atypical carcinoid lung tumor ([place])   . Cholelithiases   . TIA (transient ischemic attack)   . Positional vertigo     benign  . Impaired fasting blood sugar   . Symptoms, such as flushing, sleeplessness, headache, lack of concentration, associated with the menopause   . Osteoarthritis   . Cataracts, bilateral   . Keloid     mid sternum  . Gilbert's syndrome   . Hyperglycemia     mild    HOSPITAL COURSE:  [name] is a 81 y.o. female with a known history of coronary artery disease status post PTCA in 1986, hyperlipidemia, depression/anxiety, hypertension comes to the emergency room after she started having chest discomfort around 3:00 in the morning. Patient reports eating pizza for dinner last night. Around 8:00 she started having some indigestion burning in her chest.   1. Indigestion/GERD after vomiting at 3 AM suspected due to  food poisoning from eating pizza last night -Resolved tolerating by mouth diet. -Hemodynamically stable -IV Zofran as needed   2. Unstable angina with postive troponin and stress tes Ruled in for NSTEMI -Cycle cardiac enzymes elevated -Continue aspirin Plavix beta blockers and her statins - cardiology consultation appreciated. No need for lovenox treatment dose -Recommend patient can be worked up as outpatient by her primary cardiologist which is Dr. Rockey Situ. Patient asymptomatic. Okay from cardiology standpoint for patient to go home. Patient agreeable.  3. Hypertension continue home meds  4. GERD Continue PPI  5. DVT prophylaxis subcutaneous Lovenox D/c home from cardiac standpoint  CONSULTS OBTAINED:  Treatment Team:  Minna Merritts, MD Wellington Hampshire, MD  DRUG ALLERGIES:   Allergies  Allergen Reactions  . Biaxin [Clarithromycin]     depressed  . Celecoxib Other (See Comments)    Reaction: Unknown  . Codeine Other (See Comments)    Reaction: Unknown  . Lisinopril     cough  . Macrobid [employer] Macro] Other (See Comments)    Reaction: Unknown  . Oxycodone Hcl Other (See Comments)    Reaction: Unknown  . Penicillins Other (See Comments)    Reaction: Unknown  . Sertraline Hcl Other (See Comments)    Reaction: Unknown    DISCHARGE MEDICATIONS:   Current Discharge Medication List    CONTINUE these medications which have NOT CHANGED   Details  amLODipine (NORVASC) 10 MG tablet  Take 10 mg by mouth daily.    aspirin 81 MG tablet Take 81 mg by mouth daily.    atorvastatin (LIPITOR) 20 MG tablet Take 20 mg by mouth daily.    Calcium Carbonate-Vitamin D (CALCIUM 600 + D PO) Take 1 tablet by mouth daily. 600 mg/ 400 mg    clopidogrel (PLAVIX) 75 MG tablet Take 75 mg by mouth daily.   Associated Diagnoses: Coronary atherosclerosis of unspecified type of vessel, native or graft; UTI (urinary tract infection)    estrogens, conjugated, (PREMARIN)  0.3 MG tablet Take 0.3 mg by mouth daily. Take daily for 21 days then do not take for 7 days.    furosemide (LASIX) 20 MG tablet Take 20 mg by mouth daily.    KLOR-CON M20 20 MEQ tablet TAKE 1 TABLET (20 MEQ TOTAL) BY MOUTH 2 (TWO) TIMES DAILY. Qty: 90 tablet, Refills: 3    LORazepam (ATIVAN) 0.5 MG tablet Take 0.5 mg by mouth every 8 (eight) hours as needed for anxiety.     losartan (COZAAR) 100 MG tablet TAKE 1 TABLET (100 MG TOTAL) BY MOUTH DAILY. Qty: 90 tablet, Refills: 3    metoprolol succinate (TOPROL-XL) 50 MG 24 hr tablet Take 1 tablet (50 mg total) by mouth daily. Take with or immediately following a meal. Qty: 90 tablet, Refills: 3    Multiple Vitamin (MULTIVITAMIN) tablet Take 1 tablet by mouth daily.    nitroGLYCERIN (NITROSTAT) 0.4 MG SL tablet Place 0.4 mg under the tongue every 5 (five) minutes as needed for chest pain.     omeprazole (PRILOSEC) 20 MG capsule Take 20 mg by mouth daily.        If you experience worsening of your admission symptoms, develop shortness of breath, life threatening emergency, suicidal or homicidal thoughts you must seek medical attention immediately by calling 911 or calling your MD immediately  if symptoms less severe.  You Must read complete instructions/literature along with all the possible adverse reactions/side effects for all the Medicines you take and that have been prescribed to you. Take any new Medicines after you have completely understood and accept all the possible adverse reactions/side effects.   Please note  You were cared for by a hospitalist during your hospital stay. If you have any questions about your discharge medications or the care you received while you were in the hospital after you are discharged, you can call the unit and asked to speak with the hospitalist on call if the hospitalist that took care of you is not available. Once you are discharged, your primary care physician will handle any further medical issues.  Please note that NO REFILLS for any discharge medications will be authorized once you are discharged, as it is imperative that you return to your primary care physician (or establish a relationship with a primary care physician if you do not have one) for your aftercare needs so that they can reassess your need for medications and monitor your lab values. CBC   Recent Labs Lab 12/28/14 0536  WBC 6.9  HGB 13.6  HCT 40.3  PLT 164    Chemistries   Recent Labs Lab 12/28/14 0536  NA 139  K 3.6  CL 105  CO2 26  GLUCOSE 182*  BUN 17  CREATININE 0.74  CALCIUM 9.2    Microbiology Results   No results found for this or any previous visit (from the past 240 hour(s)).  RADIOLOGY:  Nm Myocar Multi W/spect W/wall Motion / Ef  12/29/2014  Defect 1: There is a small defect of mild severity present in the basal inferior location.  Downsloping ST segment depression ST segment depression of 3 mm was noted during stress in the II, III, aVF, V4, V5 and V6 leads.  Findings consistent with mild ischemia in the basal inferior wall.  This is a low risk study given that the ischemia is only mild and small distribution.  The left ventricular ejection fraction is normal (55-65%).      Management plans discussed with the patient, family and they are in agreement.  CODE STATUS:     Code Status Orders        Start     Ordered   12/28/14 0917  Full code   Continuous     12/28/14 0916    Advance Directive Documentation        Most Recent Value   Type of Advance Directive  Healthcare Power of Attorney, Living will   Pre-existing out of facility DNR order (yellow form or pink MOST form)     "MOST" Form in Place?        TOTAL TIME TAKING CARE OF THIS PATIENT: 40 minutes.    Evea Sheek M.D on 12/30/2014 at 12:31 PM  Between 7am to 6pm - Pager - [phone] After 6pm go to [email or web address] - password [email or web address]  [facility]  Office  [phone]  CC: Primary care  physician; Mathews Argyle, MD

## 2015-02-07 DIAGNOSIS — H353123 Nonexudative age-related macular degeneration, left eye, advanced atrophic without subfoveal involvement: Secondary | ICD-10-CM | POA: Diagnosis not present

## 2015-02-07 DIAGNOSIS — H353211 Exudative age-related macular degeneration, right eye, with active choroidal neovascularization: Secondary | ICD-10-CM | POA: Diagnosis not present

## 2015-02-08 ENCOUNTER — Encounter: Payer: Self-pay | Admitting: Nurse Practitioner

## 2015-02-13 ENCOUNTER — Ambulatory Visit (INDEPENDENT_AMBULATORY_CARE_PROVIDER_SITE_OTHER): Payer: Medicare Other | Admitting: Physician Assistant

## 2015-02-13 ENCOUNTER — Encounter: Payer: Self-pay | Admitting: Physician Assistant

## 2015-02-13 VITALS — BP 120/62 | HR 71 | Ht 66.5 in | Wt 182.5 lb

## 2015-02-13 DIAGNOSIS — F419 Anxiety disorder, unspecified: Secondary | ICD-10-CM

## 2015-02-13 DIAGNOSIS — R0789 Other chest pain: Secondary | ICD-10-CM

## 2015-02-13 DIAGNOSIS — E785 Hyperlipidemia, unspecified: Secondary | ICD-10-CM | POA: Diagnosis not present

## 2015-02-13 DIAGNOSIS — K219 Gastro-esophageal reflux disease without esophagitis: Secondary | ICD-10-CM

## 2015-02-13 DIAGNOSIS — I2510 Atherosclerotic heart disease of native coronary artery without angina pectoris: Secondary | ICD-10-CM

## 2015-02-13 DIAGNOSIS — R079 Chest pain, unspecified: Secondary | ICD-10-CM | POA: Diagnosis not present

## 2015-02-13 DIAGNOSIS — I10 Essential (primary) hypertension: Secondary | ICD-10-CM

## 2015-02-13 MED ORDER — ISOSORBIDE MONONITRATE ER 30 MG PO TB24: 15.0000 mg | ORAL_TABLET | Freq: Every day | ORAL | Status: DC

## 2015-02-13 NOTE — Patient Instructions (Addendum)
Medication Instructions:  Please start Imdur 15 mg (1/2 tablet) once daily  Labwork: None  Testing/Procedures: None  Follow-Up: Your physician recommends that you schedule a follow-up appointment in:  3 months w/ Dr. Rockey Situ  Date & time: ______________________________  If you need a refill on your cardiac medications before your next appointment, please call your pharmacy.

## 2015-02-13 NOTE — Progress Notes (Signed)
Cardiology Office Note Date:  02/13/2015  Patient ID:  [name], [name] 10/22/1930, MRN [id number] PCP:  Mathews Argyle, MD  Cardiologist:  Dr. Rockey Situ, MD    Chief Complaint: Hospital follow up for atypical chest pain  History of Present Illness: [name] is a 82 y.o. female with history of CAD s/p multiple PCI's below, bilateral carotid artery disease, frequent PVCs, HTN, HLD, TIA, chronic chest pain, anxiety, and depression who presents for hospital follow up from 12/16-12/18 for atypical chest pain after eating pizza and [employer] leading to nausea and vomiting.  She underwent 3 vessel PTCA at [facility] in 1986 followed by PCI of the LAD with BMS in 2001, and PCI of mid LCx with BMS and cutting balloon. She most recently underwent cardiac catherization in April 2014 in the setting of an abnormal nuclear stress test that was done for abdominal discomfort in the setting of ventricular bigeminy. Cardiac cath showed underlying 3 vessel CAD with occluded proximal RCA with good left to right collaterals. This was a new finding since her most recent cardiac cath. The stents in the LAD and LCx were patent with no significant restenosis. LAD was significanltly calcified especially along the proximal segment. Mid LAD with 30-40% stenosis, distal LAD with minor irregs. D1 very small and diffusely diseased, D2 minor irregs, D3 very small, LCx moderately calcified stent with 20% ISR, OM1 with minor irregs, OM2 mild ostial disease, OM3 minor irregs, RCA as above. She had normal LV systolic function. She was having frequent PVCs and was already on a beta blocker. She has declined further medical management of her PVCs, and continues to do so as per her report they have decreased. She was hospitalized in 10/2013 for chest pain with a negative work up. Stress test was offered, but she declined. She was noted to be hypertensive at that time and her losartan was increased. Most recent echo from 10/2013  showed EF of 60-65%, DD, mild LVH, mild TR, mildly elevated right-sided pressures. She has had issues with leg fatigue that have prevented her from starting a regular exercise regimen, though this has been encuraged.   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 Cardiac with a peak of 0.53 felt to be secondary to nausea and vomiting. ECG was non-acute. CXR was negative. She underwent Lexiscan Myoview that showed mild ischemia in the basal inferior wall in a small distribution. EF 55-65%. This was a low risk study. She was continued on her home medications.   Since her discharge she has felt better. She has occasional chest discomfort, not described as pain or pressure at nighttime that last approximately 10 minutes and is able to fall asleep without issues. No exertional symptoms. No associated symptoms. She has continued to take her medications without issues.    Past Medical History  Diagnosis Date  . HTN (hypertension)   . CAD (coronary artery disease)     a. 1986: s/p PTCA of bifurcation lesion in LAD;  b. 2001: s/p BMS to LAD & LCX;  c. 04/2006 Cath: diff CAD w/o critical narrowing, nl EF;  d. 06/2010 Echo: EF 60-65%, mild AI/MR.  . Carotid disease, bilateral ([place])     a. RICA XX123456, LICA 123456  . Hyperlipidemia   . Macular degeneration   . Depression   . Anxiety   . Atypical carcinoid lung tumor ([place])   . Cholelithiases   .  TIA (transient ischemic attack)   . Positional vertigo     benign  . Impaired fasting blood sugar   . Symptoms, such as flushing, sleeplessness, headache, lack of concentration, associated with the menopause   . Osteoarthritis   . Cataracts, bilateral   . Keloid     mid sternum  . Gilbert's syndrome   . Hyperglycemia     mild    Past Surgical History  Procedure Laterality Date  . Pci  1986  . Carcinoid      removed from lung, [place]ney  . Pic of lad  2001      with non des lad   . Pci of mid cfx      with non des and cutting balloon pci of av circumflex  . Left heart catheterization with coronary angiogram N/A 04/27/2012    Procedure: LEFT HEART CATHETERIZATION WITH CORONARY ANGIOGRAM;  Surgeon: Peter M Martinique, MD;  Location: [facility] CATH LAB;  Service: Cardiovascular;  Laterality: N/A;    Current Outpatient Prescriptions  Medication Sig Dispense Refill  . amLODipine (NORVASC) 10 MG tablet Take 10 mg by mouth daily.    Marland Kitchen aspirin 81 MG tablet Take 81 mg by mouth daily.    Marland Kitchen atorvastatin (LIPITOR) 20 MG tablet Take 20 mg by mouth daily.    . Calcium Carbonate-Vitamin D (CALCIUM 600 + D PO) Take 1 tablet by mouth daily. 600 mg/ 400 mg    . clopidogrel (PLAVIX) 75 MG tablet Take 75 mg by mouth daily.    Marland Kitchen estrogens, conjugated, (PREMARIN) 0.3 MG tablet Take 0.3 mg by mouth daily. Take daily for 21 days then do not take for 7 days.    . furosemide (LASIX) 20 MG tablet Take 20 mg by mouth daily.    Marland Kitchen KLOR-CON M20 20 MEQ tablet TAKE 1 TABLET (20 MEQ TOTAL) BY MOUTH 2 (TWO) TIMES DAILY. 90 tablet 3  . LORazepam (ATIVAN) 0.5 MG tablet Take 0.5 mg by mouth every 8 (eight) hours as needed for anxiety.     Marland Kitchen losartan (COZAAR) 100 MG tablet TAKE 1 TABLET (100 MG TOTAL) BY MOUTH DAILY. 90 tablet 3  . metoprolol succinate (TOPROL-XL) 50 MG 24 hr tablet Take 1 tablet (50 mg total) by mouth daily. Take with or immediately following a meal. 90 tablet 3  . Multiple Vitamin (MULTIVITAMIN) tablet Take 1 tablet by mouth daily.    . nitroGLYCERIN (NITROSTAT) 0.4 MG SL tablet Place 0.4 mg under the tongue every 5 (five) minutes as needed for chest pain.     Marland Kitchen omeprazole (PRILOSEC) 20 MG capsule Take 20 mg by mouth daily.    . isosorbide mononitrate (IMDUR) 30 MG 24 hr tablet Take 0.5 tablets (15 mg total) by mouth daily. 30 tablet 6   No current facility-administered medications for this visit.    Allergies:   Biaxin; Celecoxib; Codeine; Lisinopril; Macrobid; Oxycodone  hcl; Penicillins; and Sertraline hcl   Social History:  The patient  reports that she has never smoked. She does not have any smokeless tobacco history on file. She reports that she does not drink alcohol or use illicit drugs.   Family History:  The patient's family history includes CAD in her father and mother; Diabetes Mellitus I in her brother, mother, sister, and sister.  ROS:   Review of Systems  Constitutional: Negative for fever, chills, weight loss, malaise/fatigue and diaphoresis.  HENT: Negative for congestion.   Eyes: Negative for discharge and redness.  Respiratory: Negative for cough,  hemoptysis, sputum production, shortness of breath and wheezing.   Cardiovascular: Positive for chest pain. Negative for palpitations, orthopnea, claudication, leg swelling and PND.       Intermittent chest pressure at nighttime   Gastrointestinal: Negative for heartburn, nausea, vomiting and abdominal pain.  Musculoskeletal: Negative for myalgias and falls.  Skin: Negative for rash.  Neurological: Negative for dizziness, tremors, sensory change, speech change, focal weakness, loss of consciousness and weakness.  Endo/Heme/Allergies: Does not bruise/bleed easily.  Psychiatric/Behavioral: Negative for substance abuse. The patient is nervous/anxious.   All other systems reviewed and are negative.     PHYSICAL EXAM:  VS:  BP 120/62 mmHg  Pulse 71  Ht 5' 6.5" (1.689 m)  Wt 182 lb 8 oz (82.781 kg)  BMI 29.02 kg/m2 BMI: Body mass index is 29.02 kg/(m^2). Well nourished, well developed, in no acute distress HEENT: normocephalic, atraumatic Neck: no JVD, carotid bruits or masses Cardiac:  normal S1, S2; RRR; no murmurs, rubs, or gallops Lungs:  clear to auscultation bilaterally, no wheezing, rhonchi or rales Abd: soft, nontender, no hepatomegaly, + BS MS: no deformity or atrophy Ext: no edema Skin: warm and dry, no rash Neuro:  moves all extremities spontaneously, no focal abnormalities  noted, follows commands Psych: euthymic mood, full affect   EKG:  Was ordered today. Shows NSR, 69 bpm, rare PVC, no significant st/t changes  Recent Labs: 12/28/2014: BUN 17; Creatinine, Ser 0.74; Hemoglobin 13.6; Platelets 164; Potassium 3.6; Sodium 139  No results found for requested labs within last 365 days.   CrCl cannot be calculated (Patient has no serum creatinine result on file.).   Wt Readings from Last 3 Encounters:  02/13/15 182 lb 8 oz (82.781 kg)  12/28/14 180 lb 9.6 oz (81.92 kg)  08/15/14 184 lb 4 oz (83.575 kg)     Other studies reviewed: Additional studies/records reviewed today include: summarized above  ASSESSMENT AND PLAN:  1. Atypical chest pain/CAD s/p PCI as above: -Offered patient cardiac cath to further evaluate her symptoms, she declined -She prefers to treat medically at this time. She agrees to add Imdur 15 mg daily -Continue aspirin 81 mg and Plavix 75 mg daily -Toprol XL 50 mg daily  2. GERD: -Improved s/p Prilosec -Per PCP  3. HTN: -Well controlled -Continue current medications  4. HLD: -Lipitor 20 mg daily  5. Anxiety: -Has Ativan for prn usage -She appears quite anxious today -Perhaps this is playing a role in the above  Disposition: F/u with Dr. Rockey Situ, MD in 3 months  Current medicines are reviewed at length with the patient today.  The patient did not have any concerns regarding medicines.  Melvern Banker PA-C 02/13/2015 5:12 PM     [place] [address] [place], [place] [address] [phone]

## 2015-02-20 ENCOUNTER — Other Ambulatory Visit: Payer: Self-pay | Admitting: Cardiovascular Disease

## 2015-03-11 DIAGNOSIS — I10 Essential (primary) hypertension: Secondary | ICD-10-CM | POA: Diagnosis not present

## 2015-03-11 DIAGNOSIS — Z0000 Encounter for general adult medical examination without abnormal findings: Secondary | ICD-10-CM | POA: Diagnosis not present

## 2015-03-11 DIAGNOSIS — R109 Unspecified abdominal pain: Secondary | ICD-10-CM | POA: Diagnosis not present

## 2015-03-11 DIAGNOSIS — F3340 Major depressive disorder, recurrent, in remission, unspecified: Secondary | ICD-10-CM | POA: Diagnosis not present

## 2015-03-11 DIAGNOSIS — E7800 Pure hypercholesterolemia, unspecified: Secondary | ICD-10-CM | POA: Diagnosis not present

## 2015-03-11 DIAGNOSIS — Z79899 Other long term (current) drug therapy: Secondary | ICD-10-CM | POA: Diagnosis not present

## 2015-04-22 ENCOUNTER — Other Ambulatory Visit: Payer: Self-pay | Admitting: Cardiovascular Disease

## 2015-04-22 DIAGNOSIS — I6523 Occlusion and stenosis of bilateral carotid arteries: Secondary | ICD-10-CM

## 2015-05-10 DIAGNOSIS — H353130 Nonexudative age-related macular degeneration, bilateral, stage unspecified: Secondary | ICD-10-CM | POA: Diagnosis not present

## 2015-05-10 DIAGNOSIS — Z961 Presence of intraocular lens: Secondary | ICD-10-CM | POA: Diagnosis not present

## 2015-05-13 ENCOUNTER — Ambulatory Visit (INDEPENDENT_AMBULATORY_CARE_PROVIDER_SITE_OTHER): Payer: Medicare Other | Admitting: Cardiovascular Disease

## 2015-05-13 ENCOUNTER — Encounter: Payer: Self-pay | Admitting: Cardiovascular Disease

## 2015-05-13 VITALS — BP 122/62 | HR 62 | Ht 66.5 in | Wt 180.8 lb

## 2015-05-13 DIAGNOSIS — E7800 Pure hypercholesterolemia, unspecified: Secondary | ICD-10-CM

## 2015-05-13 DIAGNOSIS — I209 Angina pectoris, unspecified: Secondary | ICD-10-CM | POA: Diagnosis not present

## 2015-05-13 DIAGNOSIS — I6522 Occlusion and stenosis of left carotid artery: Secondary | ICD-10-CM

## 2015-05-13 DIAGNOSIS — I493 Ventricular premature depolarization: Secondary | ICD-10-CM

## 2015-05-13 DIAGNOSIS — I10 Essential (primary) hypertension: Secondary | ICD-10-CM

## 2015-05-13 DIAGNOSIS — I6523 Occlusion and stenosis of bilateral carotid arteries: Secondary | ICD-10-CM | POA: Diagnosis not present

## 2015-05-13 DIAGNOSIS — R0789 Other chest pain: Secondary | ICD-10-CM | POA: Diagnosis not present

## 2015-05-13 MED ORDER — NITROGLYCERIN 0.4 MG SL SUBL: 0.4000 mg | SUBLINGUAL_TABLET | SUBLINGUAL | Status: DC | PRN

## 2015-05-13 NOTE — Assessment & Plan Note (Signed)
She feels that symptoms have resolved, denies any palpitations No further workup needed at this time

## 2015-05-13 NOTE — Assessment & Plan Note (Signed)
She continues to have rare episodes of chest pain. Recommended that she take nitroglycerin for any chest tightness in the middle of the night, new refill provided, discussed how to take this. Also suggested she try isosorbide 30 mg daily or 15 mg twice a day. Suggested if symptoms get worse with increasing frequency of chest pain, that she call our office. Testing may need to be done

## 2015-05-13 NOTE — Patient Instructions (Signed)
You are doing well. No medication changes were made.  Take nitro SL for any chest tightness, OK to increase the isosorbide to a full if you continue to have chest tightness (1/2 pill twice a day)  Please call us if you have new issues that need to be addressed before your next appt.  Your physician wants you to follow-up in: 6 months.  You will receive a reminder letter in the mail two months in advance. If you don't receive a letter, please call our office to schedule the follow-up appointment.

## 2015-05-13 NOTE — Assessment & Plan Note (Signed)
Previous results discussed with her in detail. 50% blockage on the left, repeat scan scheduled for August 2017

## 2015-05-13 NOTE — Assessment & Plan Note (Signed)
Most recent lab work through primary care, Cholesterol is at goal on the current lipid regimen, based on her previous numbers. No changes to the medications were made.   Total encounter time more than 25 minutes  Greater than 50% was spent in counseling and coordination of care with the patient

## 2015-05-13 NOTE — Progress Notes (Signed)
Patient ID: [name], female    DOB: 12/18/1930, 82 y.o.   MRN: [id number]  HPI Comments: 82 yo woman with CAD, prior PCI of the LAD in 2001 with non DES, PCI of mid LCX with non des and cutting balloon, chronic chest pain episodes,  cardiac catheterization April 2014 showing occluded RCA, patent LAD and left circumflex and presents for followup . She has 50% carotid disease on the left.  She presents today for follow-up of her coronary artery disease, frequent PVCs, hypertension, carotid arterial disease 50% carotid disease on the left  In follow-up today, she reports that she has been having some chest tightness, rare episodes that wake her at night One episode recently at 4 AM, low-grade chest tightness, then she resolved on its own. She does have nitroglycerin but has not been taking this. She has noticed a dramatic improvement in her symptoms on isosorbide 15 mg daily. No escalation in her symptoms.  Long history of GI issues, diarrhea, has to take extra potassium daily Most recent potassium above 3.5 at the end of December 2016  Chronic leg weakness, does her ADLs, does not work in the garden anymore, unable to get up if she is on the ground  Reports that she thinks her PVCs have resolved  EKG on today's visit shows normal sinus rhythm with rate 62 bpm, PVCs in a trigeminal pattern  Other past medical history  seen in the hospital 11/03/2013 for chest pain. Workup was negative. Stress test was offered but she declined Blood pressure was elevated in the hospital, likely exacerbated by anxiety. Losartan was increased up to 100 mg daily  Previous lab work total cholesterol 136, LDL 14, HDL 56, hemoglobin A1c 5.8, TSH 3.7 Echocardiogram 11/04/2013 was reviewed with her showing normal LV systolic function, moderately elevated right ventricular systolic pressure, otherwise normal study  echocardiogram in 2012 demonstrated well-preserved left ventricular function, and trace AI.   3  vessel PTCA at [facility] in 1986 and then had a stent placed several years ago  (non DES).     Allergies  Allergen Reactions  . Biaxin [Clarithromycin]     depressed  . Celecoxib Other (See Comments)    Reaction: Unknown  . Codeine Other (See Comments)    Reaction: Unknown  . Lisinopril     cough  . Macrobid [employer] Macro] Other (See Comments)    Reaction: Unknown  . Oxycodone Hcl Other (See Comments)    Reaction: Unknown  . Penicillins Other (See Comments)    Reaction: Unknown  . Sertraline Hcl Other (See Comments)    Reaction: Unknown    Current Outpatient Prescriptions on File Prior to Visit  Medication Sig Dispense Refill  . amLODipine (NORVASC) 10 MG tablet Take 10 mg by mouth daily.    Marland Kitchen aspirin 81 MG tablet Take 81 mg by mouth daily.    Marland Kitchen atorvastatin (LIPITOR) 20 MG tablet Take 20 mg by mouth daily.    . Calcium Carbonate-Vitamin D (CALCIUM 600 + D PO) Take 1 tablet by mouth daily. 600 mg/ 400 mg    . clopidogrel (PLAVIX) 75 MG tablet Take 75 mg by mouth daily.    Marland Kitchen estrogens, conjugated, (PREMARIN) 0.3 MG tablet Take 0.3 mg by mouth daily. Take daily for 21 days then do not take for 7 days.    . furosemide (LASIX) 20 MG tablet Take 20 mg by mouth daily.    . isosorbide mononitrate (IMDUR) 30 MG 24 hr tablet Take 0.5  tablets (15 mg total) by mouth daily. 30 tablet 6  . KLOR-CON M20 20 MEQ tablet TAKE 1 TABLET (20 MEQ TOTAL) BY MOUTH 2 (TWO) TIMES DAILY. 90 tablet 3  . LORazepam (ATIVAN) 0.5 MG tablet Take 0.5 mg by mouth every 8 (eight) hours as needed for anxiety.     Marland Kitchen losartan (COZAAR) 100 MG tablet TAKE 1 TABLET (100 MG TOTAL) BY MOUTH DAILY. 90 tablet 3  . metoprolol succinate (TOPROL-XL) 50 MG 24 hr tablet Take 1 tablet (50 mg total) by mouth daily. Take with or immediately following a meal. 90 tablet 3  . Multiple Vitamin (MULTIVITAMIN) tablet Take 1 tablet by mouth daily.    Marland Kitchen omeprazole (PRILOSEC) 20 MG capsule Take 20 mg by mouth daily.     No  current facility-administered medications on file prior to visit.    Past Medical History  Diagnosis Date  . HTN (hypertension)   . CAD (coronary artery disease)     a. 1986: s/p PTCA of bifurcation lesion in LAD;  b. 2001: s/p BMS to LAD & LCX;  c. 04/2006 Cath: diff CAD w/o critical narrowing, nl EF;  d. 06/2010 Echo: EF 60-65%, mild AI/MR.  . Carotid disease, bilateral ([place])     a. RICA XX123456, LICA 123456  . Hyperlipidemia   . Macular degeneration   . Depression   . Anxiety   . Atypical carcinoid lung tumor ([place])   . Cholelithiases   . TIA (transient ischemic attack)   . Positional vertigo     benign  . Impaired fasting blood sugar   . Symptoms, such as flushing, sleeplessness, headache, lack of concentration, associated with the menopause   . Osteoarthritis   . Cataracts, bilateral   . Keloid     mid sternum  . Gilbert's syndrome   . Hyperglycemia     mild    Past Surgical History  Procedure Laterality Date  . Pci  1986  . Carcinoid      removed from lung, [place]  . Pic of lad  2001    with non des lad   . Pci of mid cfx      with non des and cutting balloon pci of av circumflex  . Left heart catheterization with coronary angiogram N/A 04/27/2012    Procedure: LEFT HEART CATHETERIZATION WITH CORONARY ANGIOGRAM;  Surgeon: Peter M Martinique, MD;  Location: [facility] CATH LAB;  Service: Cardiovascular;  Laterality: N/A;    Social History  reports that she has never smoked. She does not have any smokeless tobacco history on file. She reports that she does not drink alcohol or use illicit drugs.  Family History family history includes CAD in her father and mother; Diabetes Mellitus I in her brother, mother, sister, and sister.   Review of Systems  Constitutional: Positive for fatigue.  Respiratory: Negative.   Cardiovascular: Negative.   Endocrine: Negative.   Musculoskeletal: Positive for arthralgias.       Chronic leg pain  Skin: Negative.   Allergic/Immunologic:  Negative.   Neurological: Negative.   Hematological: Negative.   Psychiatric/Behavioral: Positive for sleep disturbance.  All other systems reviewed and are negative.   BP 122/62 mmHg  Pulse 62  Ht 5' 6.5" (1.689 m)  Wt 180 lb 12 oz (81.988 kg)  BMI 28.74 kg/m2  Physical Exam  Constitutional: She is oriented to person, place, and time. She appears well-developed and well-nourished.  HENT:  Head: Normocephalic.  Nose: Nose normal.  Mouth/Throat: Oropharynx is clear  and moist.  Eyes: Conjunctivae are normal. Pupils are equal, round, and reactive to light.  Neck: Normal range of motion. Neck supple. No JVD present.  Cardiovascular: Normal rate, S1 normal, S2 normal and intact distal pulses.  A regularly irregular rhythm present. Frequent extrasystoles are present. Exam reveals no gallop and no friction rub.   Murmur heard.  Systolic murmur is present with a grade of 1/6  Pulmonary/Chest: Effort normal and breath sounds normal. No respiratory distress. She has no wheezes. She has no rales. She exhibits no tenderness.  Abdominal: Soft. Bowel sounds are normal. She exhibits no distension. There is no tenderness.  Musculoskeletal: Normal range of motion. She exhibits no edema or tenderness.  Lymphadenopathy:    She has no cervical adenopathy.  Neurological: She is alert and oriented to person, place, and time. Coordination normal.  Skin: Skin is warm and dry. No rash noted. No erythema.  Psychiatric: She has a normal mood and affect. Her behavior is normal. Judgment and thought content normal.    Assessment and Plan  Nursing note and vitals reviewed.

## 2015-05-13 NOTE — Assessment & Plan Note (Signed)
Blood pressure is well controlled on today's visit. No changes made to the medications. May need to increase the isosorbide dose

## 2015-06-11 DIAGNOSIS — H353212 Exudative age-related macular degeneration, right eye, with inactive choroidal neovascularization: Secondary | ICD-10-CM | POA: Diagnosis not present

## 2015-06-11 DIAGNOSIS — H43813 Vitreous degeneration, bilateral: Secondary | ICD-10-CM | POA: Diagnosis not present

## 2015-06-11 DIAGNOSIS — H353123 Nonexudative age-related macular degeneration, left eye, advanced atrophic without subfoveal involvement: Secondary | ICD-10-CM | POA: Diagnosis not present

## 2015-06-11 DIAGNOSIS — H4322 Crystalline deposits in vitreous body, left eye: Secondary | ICD-10-CM | POA: Diagnosis not present

## 2015-08-18 ENCOUNTER — Other Ambulatory Visit: Payer: Self-pay | Admitting: Cardiovascular Disease

## 2015-08-19 ENCOUNTER — Ambulatory Visit: Payer: Medicare Other

## 2015-08-19 DIAGNOSIS — I6523 Occlusion and stenosis of bilateral carotid arteries: Secondary | ICD-10-CM

## 2015-08-27 ENCOUNTER — Other Ambulatory Visit: Payer: Self-pay | Admitting: Cardiovascular Disease

## 2015-09-09 DIAGNOSIS — E7800 Pure hypercholesterolemia, unspecified: Secondary | ICD-10-CM | POA: Diagnosis not present

## 2015-09-09 DIAGNOSIS — I2510 Atherosclerotic heart disease of native coronary artery without angina pectoris: Secondary | ICD-10-CM | POA: Diagnosis not present

## 2015-09-09 DIAGNOSIS — I10 Essential (primary) hypertension: Secondary | ICD-10-CM | POA: Diagnosis not present

## 2015-11-06 ENCOUNTER — Emergency Department: Payer: Medicare Other

## 2015-11-06 ENCOUNTER — Observation Stay
Admission: EM | Admit: 2015-11-06 | Discharge: 2015-11-07 | Disposition: A | Discharge status: Home / Self Care | Payer: Medicare Other | Attending: Internal Medicine | Admitting: Internal Medicine

## 2015-11-06 ENCOUNTER — Encounter: Payer: Self-pay | Admitting: Emergency Medicine

## 2015-11-06 DIAGNOSIS — I10 Essential (primary) hypertension: Secondary | ICD-10-CM | POA: Diagnosis not present

## 2015-11-06 DIAGNOSIS — Z8249 Family history of ischemic heart disease and other diseases of the circulatory system: Secondary | ICD-10-CM | POA: Insufficient documentation

## 2015-11-06 DIAGNOSIS — H3530 Unspecified macular degeneration: Secondary | ICD-10-CM | POA: Diagnosis not present

## 2015-11-06 DIAGNOSIS — R0789 Other chest pain: Secondary | ICD-10-CM | POA: Diagnosis not present

## 2015-11-06 DIAGNOSIS — R0602 Shortness of breath: Secondary | ICD-10-CM | POA: Diagnosis not present

## 2015-11-06 DIAGNOSIS — I252 Old myocardial infarction: Secondary | ICD-10-CM | POA: Insufficient documentation

## 2015-11-06 DIAGNOSIS — I6523 Occlusion and stenosis of bilateral carotid arteries: Secondary | ICD-10-CM | POA: Insufficient documentation

## 2015-11-06 DIAGNOSIS — R05 Cough: Secondary | ICD-10-CM | POA: Diagnosis present

## 2015-11-06 DIAGNOSIS — I081 Rheumatic disorders of both mitral and tricuspid valves: Secondary | ICD-10-CM | POA: Insufficient documentation

## 2015-11-06 DIAGNOSIS — I440 Atrioventricular block, first degree: Secondary | ICD-10-CM | POA: Insufficient documentation

## 2015-11-06 DIAGNOSIS — Z8673 Personal history of transient ischemic attack (TIA), and cerebral infarction without residual deficits: Secondary | ICD-10-CM | POA: Diagnosis not present

## 2015-11-06 DIAGNOSIS — I25111 Atherosclerotic heart disease of native coronary artery with angina pectoris with documented spasm: Secondary | ICD-10-CM | POA: Insufficient documentation

## 2015-11-06 DIAGNOSIS — J029 Acute pharyngitis, unspecified: Secondary | ICD-10-CM | POA: Diagnosis not present

## 2015-11-06 DIAGNOSIS — M1990 Unspecified osteoarthritis, unspecified site: Secondary | ICD-10-CM | POA: Insufficient documentation

## 2015-11-06 DIAGNOSIS — H8110 Benign paroxysmal vertigo, unspecified ear: Secondary | ICD-10-CM | POA: Insufficient documentation

## 2015-11-06 DIAGNOSIS — R079 Chest pain, unspecified: Secondary | ICD-10-CM

## 2015-11-06 DIAGNOSIS — J069 Acute upper respiratory infection, unspecified: Secondary | ICD-10-CM | POA: Diagnosis not present

## 2015-11-06 DIAGNOSIS — Z833 Family history of diabetes mellitus: Secondary | ICD-10-CM | POA: Diagnosis not present

## 2015-11-06 DIAGNOSIS — E7800 Pure hypercholesterolemia, unspecified: Secondary | ICD-10-CM | POA: Diagnosis not present

## 2015-11-06 DIAGNOSIS — E876 Hypokalemia: Secondary | ICD-10-CM

## 2015-11-06 DIAGNOSIS — R61 Generalized hyperhidrosis: Secondary | ICD-10-CM

## 2015-11-06 DIAGNOSIS — Z880 Allergy status to penicillin: Secondary | ICD-10-CM | POA: Insufficient documentation

## 2015-11-06 DIAGNOSIS — R059 Cough, unspecified: Secondary | ICD-10-CM

## 2015-11-06 DIAGNOSIS — F329 Major depressive disorder, single episode, unspecified: Secondary | ICD-10-CM | POA: Insufficient documentation

## 2015-11-06 DIAGNOSIS — K529 Noninfective gastroenteritis and colitis, unspecified: Secondary | ICD-10-CM | POA: Insufficient documentation

## 2015-11-06 DIAGNOSIS — Z7902 Long term (current) use of antithrombotics/antiplatelets: Secondary | ICD-10-CM | POA: Insufficient documentation

## 2015-11-06 DIAGNOSIS — Z7982 Long term (current) use of aspirin: Secondary | ICD-10-CM | POA: Insufficient documentation

## 2015-11-06 DIAGNOSIS — E785 Hyperlipidemia, unspecified: Secondary | ICD-10-CM | POA: Diagnosis not present

## 2015-11-06 DIAGNOSIS — F419 Anxiety disorder, unspecified: Secondary | ICD-10-CM | POA: Diagnosis not present

## 2015-11-06 DIAGNOSIS — R739 Hyperglycemia, unspecified: Secondary | ICD-10-CM | POA: Insufficient documentation

## 2015-11-06 DIAGNOSIS — I2510 Atherosclerotic heart disease of native coronary artery without angina pectoris: Secondary | ICD-10-CM | POA: Diagnosis not present

## 2015-11-06 DIAGNOSIS — I25110 Atherosclerotic heart disease of native coronary artery with unstable angina pectoris: Secondary | ICD-10-CM | POA: Diagnosis present

## 2015-11-06 DIAGNOSIS — J90 Pleural effusion, not elsewhere classified: Secondary | ICD-10-CM | POA: Insufficient documentation

## 2015-11-06 HISTORY — DX: Malignant (primary) neoplasm, unspecified: C80.1

## 2015-11-06 LAB — CBC
HEMATOCRIT: 39 % (ref 35.0–47.0)
HEMOGLOBIN: 13.7 g/dL (ref 12.0–16.0)
MCH: 31.1 pg (ref 26.0–34.0)
MCHC: 35.2 g/dL (ref 32.0–36.0)
MCV: 88.3 fL (ref 80.0–100.0)
Platelets: 175 10*3/uL (ref 150–440)
RBC: 4.42 MIL/uL (ref 3.80–5.20)
RDW: 13 % (ref 11.5–14.5)
WBC: 10.6 10*3/uL (ref 3.6–11.0)

## 2015-11-06 LAB — BASIC METABOLIC PANEL
Anion gap: 10 (ref 5–15)
BUN: 13 mg/dL (ref 6–20)
CALCIUM: 9.1 mg/dL (ref 8.9–10.3)
CO2: 25 mmol/L (ref 22–32)
CREATININE: 0.66 mg/dL (ref 0.44–1.00)
Chloride: 103 mmol/L (ref 101–111)
GFR calc Af Amer: >60 mL/min (ref >60)
GLUCOSE: 167 mg/dL — AB (ref 65–99)
Potassium: 3.4 mmol/L — ABNORMAL LOW (ref 3.5–5.1)
Sodium: 138 mmol/L (ref 135–145)

## 2015-11-06 LAB — POCT RAPID STREP A: STREPTOCOCCUS, GROUP A SCREEN (DIRECT): NEGATIVE

## 2015-11-06 LAB — TROPONIN I

## 2015-11-06 LAB — FIBRIN DERIVATIVES D-DIMER (ARMC ONLY): Fibrin derivatives D-dimer (ARMC): 973 — ABNORMAL HIGH (ref 0–499)

## 2015-11-06 MED ORDER — NITROGLYCERIN 0.4 MG SL SUBL
0.4000 mg | SUBLINGUAL_TABLET | SUBLINGUAL | Status: DC | PRN
  Administered 2015-11-06: 0.4 mg via SUBLINGUAL
  Filled 2015-11-06: qty 1

## 2015-11-06 MED ORDER — NITROGLYCERIN 2 % TD OINT
1.0000 [in_us] | TOPICAL_OINTMENT | Freq: Once | TRANSDERMAL | Status: AC
  Administered 2015-11-06: 1 [in_us] via TOPICAL
  Filled 2015-11-06: qty 1

## 2015-11-06 MED ORDER — IOPAMIDOL (ISOVUE-370) INJECTION 76%
75.0000 mL | Freq: Once | INTRAVENOUS | Status: AC | PRN
  Administered 2015-11-06: 75 mL via INTRAVENOUS

## 2015-11-06 MED ORDER — ASPIRIN 81 MG PO CHEW
324.0000 mg | CHEWABLE_TABLET | Freq: Once | ORAL | Status: AC
  Administered 2015-11-06: 324 mg via ORAL
  Filled 2015-11-06: qty 4

## 2015-11-06 NOTE — ED Provider Notes (Signed)
[facility] Emergency Department Provider Note  ____________________________________________  Time seen: Approximately 10:09 PM  I have reviewed the triage vital signs and the nursing notes.   HISTORY  Chief Complaint Chest Pain    HPI [name] is a 82 y.o. female with a history of cardiac arrest, MI status post stent on Plavix and aspirin,right carcinoid lung tumor status post partial resection carotid artery disease, HTN, HL, presenting with chest n. The patient reports that yesterday evening, she was sitting at rest when she developed an aching pain around the left breast which then radiated to the left upper chest and radiated around to the right side of the chest. She had a clammy sensationand shortness of breath without nausea or vomiting, palpitations, lightheadedness or syncope, associated with this. The pain resolved spontaneously. This morning, she had the same pain, and  Took 2 acetaminophen which resolved her pain.the pain came back this evening, also while she was at rest, and she took 2 acetaminophen at 7:30 and now says that she no longer has any pain but a "uncomfortable feeling." She has recently had a nonproductive cough with a mild sore throat and fever to 100.7.   Past Medical History:  Diagnosis Date  . Anxiety   . Atypical carcinoid lung tumor ([place])   . CAD (coronary artery disease)    a. 1986: s/p PTCA of bifurcation lesion in LAD;  b. 2001: s/p BMS to LAD & LCX;  c. 04/2006 Cath: diff CAD w/o critical narrowing, nl EF;  d. 06/2010 Echo: EF 60-65%, mild AI/MR.  . Carotid disease, bilateral ([place])    a. RICA [id number], LICA [id number]  . Cataracts, bilateral   . Cholelithiases   . Depression   . Gilbert's syndrome   . HTN (hypertension)   . Hyperglycemia    mild  . Hyperlipidemia   . Impaired fasting blood sugar   . Keloid    mid sternum  . Macular degeneration   . Osteoarthritis   . Positional vertigo    benign  . Symptoms, such as  flushing, sleeplessness, headache, lack of concentration, associated with the menopause   . TIA (transient ischemic attack)     Patient Active Problem List   Diagnosis Date Noted  . NSTEMI (non-ST elevated myocardial infarction) ([place]) 12/29/2014  . Chest pain 12/28/2014  . Angina pectoris ([place])   . Coronary artery disease involving native coronary artery of native heart with angina pectoris with documented spasm ([place])   . Emesis   . Gastroenteritis, acute   . PVC's (premature ventricular contractions) 04/25/2012  . Cardiac arrest ([place])   . Left carotid stenosis 03/01/2012  . Leg fatigue 03/01/2012  . Dysuria 03/30/2011  . Mitral regurgitation 03/30/2011  . HYPERCHOLESTEROLEMIA  IIA 08/06/2008  . Carotid bruit 05/28/2008  . Essential hypertension 05/11/2008  . Coronary atherosclerosis 05/11/2008  . Angina at rest [facility]) 05/11/2008    Past Surgical History:  Procedure Laterality Date  . carcinoid     removed from lung, [place]  . LEFT HEART CATHETERIZATION WITH CORONARY ANGIOGRAM N/A 04/27/2012   Procedure: LEFT HEART CATHETERIZATION WITH CORONARY ANGIOGRAM;  Surgeon: Peter M Martinique, MD;  Location: [facility] CATH LAB;  Service: Cardiovascular;  Laterality: N/A;  . pci  1986  . pci of mid cfx     with non des and cutting balloon pci of av circumflex  . pic of lad  2001   with non des lad     Current Outpatient Rx  .  Order #: [id number] Class: Historical Med  . Order #: [id number] Class: Historical Med  . Order #: [id number] Class: Historical Med  . Order #: [id number] Class: Historical Med  . Order #: [id number] Class: Historical Med  . Order #: [id number] Class: Historical Med  . Order #: [id number] Class: Historical Med  . Order #: [id number] Class: Normal  . Order #: [id number] Class: Normal  . Order #: [id number] Class: Historical Med  . Order #: [id number] Class: Normal  . Order #: [id number] Class: Normal  . Order #: [id number] Class: Historical Med  . Order #: [id number] Class: Normal  . Order #:  [id number] Class: Historical Med    Allergies Biaxin [clarithromycin]; Celecoxib; Codeine; Lisinopril; Macrobid [nitrofurantoin monohyd macro]; Oxycodone hcl; Penicillins; and Sertraline hcl  Family History  Problem Relation Age of Onset  . Diabetes Mellitus I Mother   . CAD Mother   . CAD Father   . Diabetes Mellitus I Sister   . Diabetes Mellitus I Brother   . Diabetes Mellitus I Sister     Social History Social History  Substance Use Topics  . Smoking status: Never Smoker  . Smokeless tobacco: Never Used  . Alcohol use No    Review of Systems Constitutional: fever to 100.7. Eyes: No visual changes.no eye discharge. ENT: mildsore throat. No congestion or rhinorrhea. Cardiovascular: positivechest pain. Denies palpitations. Respiratory: positiveshortness of breath.  positivecough. Gastrointestinal: No abdominal pain.  No nausea, no vomiting.  No diarrhea.  No constipation. Genitourinary: Negative for dysuria. Musculoskeletal: Negative for back pain. Skin: Negative for rash. Neurological: Negative for headaches. No focal numbness, tingling or weakness.   10-point ROS otherwise negative.  ____________________________________________   PHYSICAL EXAM:  VITAL SIGNS: ED Triage Vitals  Enc Vitals Group     BP 11/06/15 2119 (!) 156/65     Pulse Rate 11/06/15 2119 82     Resp 11/06/15 2119 14     Temp 11/06/15 2119 98.9 F (37.2 C)     Temp Source 11/06/15 2119 Oral     SpO2 11/06/15 2119 97 %     Weight 11/06/15 2120 157 lb 6.4 oz (71.4 kg)     Height 11/06/15 2120 5\' 6"  (1.676 m)     Head Circumference --      Peak Flow --      Pain Score 11/06/15 2121 2     Pain Loc --      Pain Edu? --      Excl. in [place]? --     Constitutional: Alert and oriented. Well appearing and in no acute distress. Answers questions appropriately. Eyes: Conjunctivae are normal.  EOMI. No scleral icterus.no eye discharge. Head: Atraumatic. Nose: No congestion/rhinnorhea. Mouth/Throat:  Mucous membranes are moist.  Neck: No stridor.  Supple.  o JVD. No meningismus. Cardiovascular: Normal rate, regular rhythm. No murmurs, rubs or gallops.  Respiratory: Normal respiratory effort.  No accessory muscle use or retractions. Lungs CTAB.  No wheezes, rales or ronchi. Gastrointestinal: Soft, nontender and nondistended.  No guarding or rebound.  No peritoneal signs. Musculoskeletal: No LE edema. No ttp in the calves or palpable cords.  Negative Homan's sign. Neurologic:  A&Ox3.  Speech is clear.  Face and smile are symmetric.  EOMI.  Moves all extremities well. Skin:  Skin is warm, dry and intact. No rash noted. Psychiatric: Mood and affect are normal. Speech and behavior are normal.  Normal judgement.  ____________________________________________   LABS (all labs ordered are listed, but only abnormal results are displayed)  Labs Reviewed  BASIC METABOLIC PANEL - Abnormal; Notable for the following:  Result Value   Potassium 3.4 (*)    Glucose, Bld 167 (*)    All other components within normal limits  FIBRIN DERIVATIVES D-DIMER ([facility] ONLY) - Abnormal; Notable for the following:    Fibrin derivatives D-dimer ([facility]) 973 (*)    All other components within normal limits  CBC  TROPONIN I   ____________________________________________  EKG  ED ECG REPORT I, Eula Listen, the attending physician, personally viewed and interpreted this ECG.   Date: 11/06/2015  EKG Time: 2114  Rate: 73  Rhythm: normal sinus rhythm  Axis: normal  Intervals:none  ST&T Change: No ST elevation. Positive left ventricular hypertrophy.  ____________________________________________  RADIOLOGY  Dg Chest 2 View  Result Date: 11/06/2015 CLINICAL DATA:  Intermittent chest pain for 2 days EXAM: CHEST  2 VIEW COMPARISON:  12/28/2014 FINDINGS: Cardiac shadow is stable. Aortic calcifications are again noted. The lungs are well aerated bilaterally without focal infiltrate or sizable  effusion. No acute bony abnormality is noted. IMPRESSION: No active cardiopulmonary disease. Electronically Signed   By: Inez Catalina M.D.   On: 11/06/2015 21:56    ____________________________________________   PROCEDURES  Procedure(s) performed: None  Procedures  Critical Care performed: No ____________________________________________   INITIAL IMPRESSION / ASSESSMENT AND PLAN / ED COURSE  Pertinent labs & imaging results that were available during my care of the patient were reviewed by me and considered in my medical decision making (see chart for details).  82 y.o. female with a history of cardiac arrest, CAD status post stent, presenting with chest painand shortness of breath, and the setting of recent sore throat and cough.the patient is mildly hypertensive but otherwise has reassuring vital signs. She has no focal cardiopulmonary findings on my examination. Given her significant cardiac history, she will need a full cardiac rule out despite her reassuring EKG and negative first troponin.this patient's chest x-ray does not show any acute cardiopulmonary processes, including pneumonia, but is possible that she has chest discomfort from her URI symptoms. I will plan to treat the patient with aspirin, nitroglycerin to see if I can get her "uncomfortable feeling" to go away completely. PE is much less likely, but the patient does have a history of lung cancer so this is also a consideration and I will get a d-dimer.  Plan admission to the hospital.  ----------------------------------------- 10:57 PM on 11/06/2015 -----------------------------------------  The patient's d-dimer is positive, several ordered a CT angiogram to evaluate for PE. I've spoken with the hospitalist on-call, who is aware that this test is pending. The patient is admitted in stable condition.  ____________________________________________  FINAL CLINICAL IMPRESSION(S) / ED DIAGNOSES  Final diagnoses:  Chest  pain, unspecified type  Shortness of breath  Sore throat  Cough  Diaphoresis    Clinical Course      NEW MEDICATIONS STARTED DURING THIS VISIT:  New Prescriptions   No medications on file      Eula Listen, MD 11/06/15 2258

## 2015-11-06 NOTE — ED Triage Notes (Signed)
Pt from home via [facility]. Pt reports intermittent chest pain that started yesterday. States pain is all the was across chest and radiates to back. Pt denies dizziness, SOB, nausea and vomiting. Pt has hx of afib. Denies history of similar episodes. Pt reports taking 2 Tylenol this morning with some relief and 2 more this evening with minimal relief.

## 2015-11-07 ENCOUNTER — Observation Stay (HOSPITAL_BASED_OUTPATIENT_CLINIC_OR_DEPARTMENT_OTHER)
Admit: 2015-11-07 | Discharge: 2015-11-07 | Disposition: A | Discharge status: Home / Self Care | Payer: Medicare Other | Attending: Family Medicine | Admitting: Family Medicine

## 2015-11-07 DIAGNOSIS — R059 Cough, unspecified: Secondary | ICD-10-CM

## 2015-11-07 DIAGNOSIS — J069 Acute upper respiratory infection, unspecified: Secondary | ICD-10-CM | POA: Diagnosis not present

## 2015-11-07 DIAGNOSIS — I25111 Atherosclerotic heart disease of native coronary artery with angina pectoris with documented spasm: Secondary | ICD-10-CM

## 2015-11-07 DIAGNOSIS — R61 Generalized hyperhidrosis: Secondary | ICD-10-CM

## 2015-11-07 DIAGNOSIS — E876 Hypokalemia: Secondary | ICD-10-CM

## 2015-11-07 DIAGNOSIS — R079 Chest pain, unspecified: Secondary | ICD-10-CM | POA: Diagnosis not present

## 2015-11-07 DIAGNOSIS — R05 Cough: Secondary | ICD-10-CM | POA: Diagnosis not present

## 2015-11-07 DIAGNOSIS — R011 Cardiac murmur, unspecified: Secondary | ICD-10-CM | POA: Diagnosis not present

## 2015-11-07 DIAGNOSIS — R0789 Other chest pain: Secondary | ICD-10-CM | POA: Diagnosis not present

## 2015-11-07 DIAGNOSIS — I10 Essential (primary) hypertension: Secondary | ICD-10-CM

## 2015-11-07 DIAGNOSIS — B9789 Other viral agents as the cause of diseases classified elsewhere: Secondary | ICD-10-CM

## 2015-11-07 DIAGNOSIS — J029 Acute pharyngitis, unspecified: Secondary | ICD-10-CM

## 2015-11-07 DIAGNOSIS — E785 Hyperlipidemia, unspecified: Secondary | ICD-10-CM | POA: Diagnosis not present

## 2015-11-07 DIAGNOSIS — I2510 Atherosclerotic heart disease of native coronary artery without angina pectoris: Secondary | ICD-10-CM | POA: Diagnosis not present

## 2015-11-07 LAB — CBC
HEMATOCRIT: 35.7 % (ref 35.0–47.0)
HEMOGLOBIN: 12.6 g/dL (ref 12.0–16.0)
MCH: 31.3 pg (ref 26.0–34.0)
MCHC: 35.3 g/dL (ref 32.0–36.0)
MCV: 88.7 fL (ref 80.0–100.0)
Platelets: 155 10*3/uL (ref 150–440)
RBC: 4.03 MIL/uL (ref 3.80–5.20)
RDW: 13 % (ref 11.5–14.5)
WBC: 9.1 10*3/uL (ref 3.6–11.0)

## 2015-11-07 LAB — LIPID PANEL
Cholesterol: 134 mg/dL (ref 0–200)
HDL: 64 mg/dL (ref >40)
LDL Cholesterol: 40 mg/dL (ref 0–99)
TRIGLYCERIDES: 149 mg/dL (ref <150)
Total CHOL/HDL Ratio: 2.1 RATIO
VLDL: 30 mg/dL (ref 0–40)

## 2015-11-07 LAB — CREATININE, SERUM: Creatinine, Ser: 0.65 mg/dL (ref 0.44–1.00)

## 2015-11-07 LAB — TROPONIN I
Troponin I: <0.03 ng/mL (ref <0.03)
Troponin I: <0.03 ng/mL (ref <0.03)

## 2015-11-07 LAB — ECHOCARDIOGRAM COMPLETE
Height: 66 in
Weight: 2849.6 oz

## 2015-11-07 LAB — TSH: TSH: 1.472 u[IU]/mL (ref 0.350–4.500)

## 2015-11-07 MED ORDER — CLOPIDOGREL BISULFATE 75 MG PO TABS: 75.0000 mg | ORAL_TABLET | Freq: Every day | ORAL | Status: DC

## 2015-11-07 MED ORDER — ENOXAPARIN SODIUM 40 MG/0.4ML ~~LOC~~ SOLN
40.0000 mg | SUBCUTANEOUS | Status: DC
  Administered 2015-11-07: 40 mg via SUBCUTANEOUS
  Filled 2015-11-07: qty 0.4

## 2015-11-07 MED ORDER — CALCIUM CARBONATE-VITAMIN D 500-200 MG-UNIT PO TABS
1.0000 | ORAL_TABLET | Freq: Every day | ORAL | Status: DC
  Administered 2015-11-07: 1 via ORAL
  Filled 2015-11-07: qty 1

## 2015-11-07 MED ORDER — METOPROLOL SUCCINATE ER 25 MG PO TB24
25.0000 mg | ORAL_TABLET | Freq: Once | ORAL | Status: AC
  Administered 2015-11-07: 25 mg via ORAL
  Filled 2015-11-07: qty 1

## 2015-11-07 MED ORDER — ISOSORBIDE MONONITRATE ER 30 MG PO TB24: 30.0000 mg | ORAL_TABLET | Freq: Every day | ORAL | Status: DC

## 2015-11-07 MED ORDER — POTASSIUM CHLORIDE CRYS ER 20 MEQ PO TBCR
20.0000 meq | EXTENDED_RELEASE_TABLET | Freq: Two times a day (BID) | ORAL | Status: DC
  Administered 2015-11-07 (×2): 20 meq via ORAL
  Filled 2015-11-07 (×2): qty 1

## 2015-11-07 MED ORDER — GUAIFENESIN-DM 100-10 MG/5ML PO SYRP: 10.0000 mL | ORAL_SOLUTION | Freq: Four times a day (QID) | ORAL | Status: DC | PRN

## 2015-11-07 MED ORDER — ACETAMINOPHEN 325 MG PO TABS: 650.0000 mg | ORAL_TABLET | Freq: Four times a day (QID) | ORAL | Status: DC | PRN

## 2015-11-07 MED ORDER — PANTOPRAZOLE SODIUM 40 MG PO TBEC
40.0000 mg | DELAYED_RELEASE_TABLET | Freq: Every day | ORAL | Status: DC
  Administered 2015-11-07: 40 mg via ORAL
  Filled 2015-11-07: qty 1

## 2015-11-07 MED ORDER — METOPROLOL TARTRATE 25 MG PO TABS: 25.0000 mg | ORAL_TABLET | Freq: Two times a day (BID) | ORAL | Status: DC

## 2015-11-07 MED ORDER — ACETAMINOPHEN 325 MG PO TABS
650.0000 mg | ORAL_TABLET | ORAL | Status: DC | PRN
  Administered 2015-11-07: 650 mg via ORAL
  Filled 2015-11-07: qty 2

## 2015-11-07 MED ORDER — FUROSEMIDE 20 MG PO TABS
20.0000 mg | ORAL_TABLET | Freq: Every day | ORAL | Status: DC
  Administered 2015-11-07: 20 mg via ORAL
  Filled 2015-11-07: qty 1

## 2015-11-07 MED ORDER — AMLODIPINE BESYLATE 10 MG PO TABS
10.0000 mg | ORAL_TABLET | Freq: Every day | ORAL | Status: DC
  Administered 2015-11-07: 10 mg via ORAL
  Filled 2015-11-07: qty 1

## 2015-11-07 MED ORDER — ASPIRIN EC 81 MG PO TBEC
81.0000 mg | DELAYED_RELEASE_TABLET | Freq: Every day | ORAL | Status: DC
  Administered 2015-11-07: 81 mg via ORAL
  Filled 2015-11-07: qty 1

## 2015-11-07 MED ORDER — CLOPIDOGREL BISULFATE 75 MG PO TABS
75.0000 mg | ORAL_TABLET | Freq: Every day | ORAL | Status: DC
  Administered 2015-11-07: 75 mg via ORAL
  Filled 2015-11-07 (×2): qty 1

## 2015-11-07 MED ORDER — LORAZEPAM 0.5 MG PO TABS
0.5000 mg | ORAL_TABLET | Freq: Three times a day (TID) | ORAL | Status: DC | PRN
  Administered 2015-11-07: 0.5 mg via ORAL
  Filled 2015-11-07: qty 1

## 2015-11-07 MED ORDER — METOPROLOL SUCCINATE ER 50 MG PO TB24: 75.0000 mg | ORAL_TABLET | Freq: Every day | ORAL | Status: DC

## 2015-11-07 MED ORDER — ADULT MULTIVITAMIN W/MINERALS CH
1.0000 | ORAL_TABLET | Freq: Every day | ORAL | Status: DC
  Administered 2015-11-07: 1 via ORAL
  Filled 2015-11-07: qty 1

## 2015-11-07 MED ORDER — NITROGLYCERIN 0.4 MG SL SUBL: 0.4000 mg | SUBLINGUAL_TABLET | SUBLINGUAL | Status: DC | PRN

## 2015-11-07 MED ORDER — ATORVASTATIN CALCIUM 20 MG PO TABS
20.0000 mg | ORAL_TABLET | Freq: Every day | ORAL | Status: DC
  Administered 2015-11-07: 20 mg via ORAL
  Filled 2015-11-07 (×2): qty 1

## 2015-11-07 MED ORDER — GI COCKTAIL ~~LOC~~: 30.0000 mL | Freq: Four times a day (QID) | ORAL | Status: DC | PRN

## 2015-11-07 MED ORDER — ONDANSETRON HCL 4 MG/2ML IJ SOLN: 4.0000 mg | Freq: Four times a day (QID) | INTRAMUSCULAR | Status: DC | PRN

## 2015-11-07 MED ORDER — LOSARTAN POTASSIUM 50 MG PO TABS
100.0000 mg | ORAL_TABLET | Freq: Every day | ORAL | Status: DC
  Administered 2015-11-07: 100 mg via ORAL
  Filled 2015-11-07: qty 2

## 2015-11-07 MED ORDER — ZOLPIDEM TARTRATE 5 MG PO TABS: 5.0000 mg | ORAL_TABLET | Freq: Every evening | ORAL | Status: DC | PRN

## 2015-11-07 MED ORDER — ISOSORBIDE MONONITRATE ER 30 MG PO TB24: 15.0000 mg | ORAL_TABLET | Freq: Every day | ORAL | Status: DC

## 2015-11-07 MED ORDER — SODIUM CHLORIDE 0.9 % IV SOLN
INTRAVENOUS | Status: DC
  Administered 2015-11-07: 01:00:00 via INTRAVENOUS

## 2015-11-07 MED ORDER — GUAIFENESIN-DM 100-10 MG/5ML PO SYRP: 10.0000 mL | ORAL_SOLUTION | Freq: Four times a day (QID) | ORAL | 0 refills | Status: DC | PRN

## 2015-11-07 MED ORDER — ESTROGENS CONJUGATED 0.3 MG PO TABS
0.3000 mg | ORAL_TABLET | Freq: Every day | ORAL | Status: DC
  Administered 2015-11-07: 0.3 mg via ORAL
  Filled 2015-11-07: qty 1

## 2015-11-07 MED ORDER — METOPROLOL SUCCINATE ER 50 MG PO TB24
50.0000 mg | ORAL_TABLET | Freq: Every day | ORAL | Status: DC
  Administered 2015-11-07: 50 mg via ORAL
  Filled 2015-11-07: qty 1

## 2015-11-07 MED ORDER — POTASSIUM CHLORIDE CRYS ER 20 MEQ PO TBCR
40.0000 meq | EXTENDED_RELEASE_TABLET | Freq: Once | ORAL | Status: AC
  Administered 2015-11-07: 40 meq via ORAL
  Filled 2015-11-07: qty 2

## 2015-11-07 MED ORDER — ISOSORBIDE MONONITRATE ER 30 MG PO TB24
15.0000 mg | ORAL_TABLET | Freq: Every day | ORAL | Status: DC
  Filled 2015-11-07: qty 1

## 2015-11-07 NOTE — Consult Note (Signed)
Cardiology Consultation Note  Patient ID: [name], MRN: [id number], DOB/AGE: [birth year] 80 y.o. Admit date: 11/06/2015   Date of Consult: 11/07/2015 Primary Physician: Mathews Argyle, MD Primary Cardiologist: Dr. Rockey Situ, MD Requesting Physician: Dr. Ara Kussmaul, DO  Chief Complaint: Congestion, cough, sore throat, and chest pain Reason for Consult: Chest pain  HPI: 82 y.o. female with h/o CAD s/p multiple PCI's below, bilateral carotid artery disease, frequent PVCs, HTN, HLD, TIA, chronic chest pain, anxiety, and depression who presents for hospital follow up from 12/16-12/18 for atypical chest pain after eating pizza and [employer] leading to nausea and vomiting.  She underwent 3 vessel PTCA at [facility] in 1986 followed by PCI of the LAD with BMS in 2001, and PCI of mid LCx with BMS and cutting balloon. She most recently underwent cardiac catherization in April 2014 in the setting of an abnormal nuclear stress test that was done for abdominal discomfort in the setting of ventricular bigeminy. Cardiac cath showed underlying 3 vessel CAD with occluded proximal RCA with good left to right collaterals. This was a new finding since her most recent cardiac cath. The stents in the LAD and LCx were patent with no significant restenosis. LAD was significanltly calcified especially along the proximal segment. Mid LAD with 30-40% stenosis, distal LAD with minor irregs. D1 very small and diffusely diseased, D2 minor irregs, D3 very small, LCx moderately calcified stent with 20% ISR, OM1 with minor irregs, OM2 mild ostial disease, OM3 minor irregs, RCA as above. She had normal LV systolic function. She was having frequent PVCs and was already on a beta blocker. She has declined further medical management of her PVCs, and continues to do so as per her report they have decreased. She was hospitalized in 10/2013 for chest pain with a negative work up. Stress test was offered, but she declined. She was noted  to be hypertensive at that time and her losartan was increased. Most recent echo from 10/2013 showed EF of 60-65%, DD, mild LVH, mild TR, mildly elevated right-sided pressures. She has had issues with leg fatigue that have prevented her from starting a regular exercise regimen, though this has been encuraged.   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 Cardiac with a peak of 0.53 felt to be secondary to nausea and vomiting. ECG was non-acute. CXR was negative. She underwent Lexiscan Myoview that showed mild ischemia in the basal inferior wall in a small distribution. EF 55-65%. This was a low risk study.  Patient has been dealing with a viral URI for the past 4 days that she feels like she acquired for her granddaughter that has been sick as well. She has noted a sore throat, nasal and chest congestion, dry cough, and headache. In the setting of her increased cough she developed bilateral band-like chest pain that radiated to her bilateral shoulder and shoulder blades. Chest pain improved with Tylenol, came back as her Tylenol wore off, then resolved again with Tylenol.   Upon the patient's arrival to [facility] they were found to have negative troponin x 4, d-dimer 973, SCr 0.66, K+ 3.4, unremarkable CBC, negative rapid strep test. ECG non-acute as below, CXR showed no active cardiopulmonary disease. CTA chest was negative for PE. Currently without chest pain.    Past Medical History:  Diagnosis Date  . Anxiety   . Atypical carcinoid lung tumor ([place])   . CAD (coronary  artery disease)    a. 1986: s/p PTCA of bifurcation lesion in LAD;  b. 2001: s/p BMS to LAD & LCX;  c. 04/2006 Cath: diff CAD w/o critical narrowing, nl EF;  d. 06/2010 Echo: EF 60-65%, mild AI/MR.  . Cancer ([place])   . Carotid disease, bilateral ([place])    a. RICA [id number], LICA [id number]  . Cataracts, bilateral   . Cholelithiases   . Depression    . Gilbert's syndrome   . HTN (hypertension)   . Hyperglycemia    mild  . Hyperlipidemia   . Impaired fasting blood sugar   . Keloid    mid sternum  . Macular degeneration   . Osteoarthritis   . Positional vertigo    benign  . Symptoms, such as flushing, sleeplessness, headache, lack of concentration, associated with the menopause   . TIA (transient ischemic attack)       Most Recent Cardiac Studies: As above   Surgical History:  Past Surgical History:  Procedure Laterality Date  . carcinoid     removed from lung, [place]  . LEFT HEART CATHETERIZATION WITH CORONARY ANGIOGRAM N/A 04/27/2012   Procedure: LEFT HEART CATHETERIZATION WITH CORONARY ANGIOGRAM;  Surgeon: Peter M Martinique, MD;  Location: [facility] CATH LAB;  Service: Cardiovascular;  Laterality: N/A;  . pci  1986  . pci of mid cfx     with non des and cutting balloon pci of av circumflex  . pic of lad  2001   with non des [place]: Prior to Admission medications   Medication Sig Start Date End Date Taking? Authorizing Provider  amLODipine (NORVASC) 10 MG tablet Take 10 mg by mouth daily.   Yes Historical Provider, MD  aspirin 81 MG tablet Take 81 mg by mouth daily.   Yes Historical Provider, MD  atorvastatin (LIPITOR) 10 MG tablet Take 10 mg by mouth daily.   Yes Historical Provider, MD  Calcium Carbonate-Vitamin D (CALCIUM 600 + D PO) Take 1 tablet by mouth daily. 600 mg/ 400 mg   Yes Historical Provider, MD  clopidogrel (PLAVIX) 75 MG tablet Take 75 mg by mouth daily.   Yes Historical Provider, MD  estrogens, conjugated, (PREMARIN) 0.3 MG tablet Take 0.3 mg by mouth daily. Take daily for 21 days then do not take for 7 days.   Yes Historical Provider, MD  furosemide (LASIX) 20 MG tablet Take 20 mg by mouth daily.   Yes Historical Provider, MD  isosorbide mononitrate (IMDUR) 30 MG 24 hr tablet Take 0.5 tablets (15 mg total) by mouth daily. 02/13/15  Yes Seleny Allbright M Honore Wipperfurth, PA-C  KLOR-CON M20 20 MEQ tablet TAKE 1 TABLET (20  MEQ TOTAL) BY MOUTH 2 (TWO) TIMES DAILY. 08/27/15  Yes Minna Merritts, MD  LORazepam (ATIVAN) 0.5 MG tablet Take 0.5 mg by mouth every 8 (eight) hours as needed for anxiety.    Yes Historical Provider, MD  losartan (COZAAR) 50 MG tablet Take 50 mg by mouth daily.   Yes Historical Provider, MD  metoprolol succinate (TOPROL-XL) 50 MG 24 hr tablet TAKE 1 TABLET (50 MG TOTAL) BY MOUTH DAILY. TAKE WITH OR IMMEDIATELY FOLLOWING A MEAL. 08/19/15  Yes Minna Merritts, MD  Multiple Vitamin (MULTIVITAMIN) tablet Take 1 tablet by mouth daily.   Yes Historical Provider, MD  nitroGLYCERIN (NITROSTAT) 0.4 MG SL tablet Place 1 tablet (0.4 mg total) under the tongue every 5 (five) minutes as needed for chest pain. 05/13/15  Yes Kathlene November  Gollan, MD  omeprazole (PRILOSEC) 20 MG capsule Take 20 mg by mouth daily.   Yes Historical Provider, MD    Inpatient Medications:  . amLODipine  10 mg Oral Daily  . aspirin EC  81 mg Oral Daily  . atorvastatin  20 mg Oral Daily  . calcium-vitamin D  1 tablet Oral Daily  . clopidogrel  75 mg Oral Daily  . enoxaparin (LOVENOX) injection  40 mg Subcutaneous Q24H  . estrogens (conjugated)  0.3 mg Oral Daily  . furosemide  20 mg Oral Daily  . isosorbide mononitrate  15 mg Oral Daily  . losartan  100 mg Oral Daily  . metoprolol succinate  50 mg Oral Daily  . multivitamin with minerals  1 tablet Oral Daily  . pantoprazole  40 mg Oral QAC breakfast  . potassium chloride SA  20 mEq Oral BID   . sodium chloride 75 mL/hr at 11/07/15 0115    Allergies:  Allergies  Allergen Reactions  . Biaxin [Clarithromycin]     depressed  . Celecoxib Other (See Comments)    Reaction: Unknown  . Codeine Other (See Comments)    Reaction: Unknown  . Lisinopril     cough  . Macrobid [employer] Macro] Other (See Comments)    Reaction: Unknown  . Oxycodone Hcl Other (See Comments)    Reaction: Unknown  . Penicillins Other (See Comments)    Reaction: Unknown  . Sertraline Hcl  Other (See Comments)    Reaction: Unknown    Social History   Social History  . Marital status: Divorced    Spouse name: N/A  . Number of children: N/A  . Years of education: N/A   Occupational History  . retired    Social History Main Topics  . Smoking status: Never Smoker  . Smokeless tobacco: Never Used  . Alcohol use No  . Drug use: No  . Sexual activity: Not on file   Other Topics Concern  . Not on file   Social History Narrative  . No narrative on file     Family History  Problem Relation Age of Onset  . Diabetes Mellitus I Mother   . CAD Mother   . CAD Father   . Diabetes Mellitus I Sister   . Diabetes Mellitus I Brother   . Diabetes Mellitus I Sister      Review of Systems: Review of Systems  Constitutional: Positive for malaise/fatigue. Negative for chills, diaphoresis, fever and weight loss.  HENT: Positive for congestion and sore throat.   Eyes: Negative for discharge and redness.  Respiratory: Positive for cough. Negative for hemoptysis, sputum production, shortness of breath and wheezing.   Cardiovascular: Positive for chest pain. Negative for palpitations, orthopnea, claudication, leg swelling and PND.  Gastrointestinal: Negative for abdominal pain, blood in stool, heartburn, melena, nausea and vomiting.  Genitourinary: Negative for hematuria.  Musculoskeletal: Negative for falls and myalgias.  Skin: Negative for rash.  Neurological: Positive for weakness and headaches. Negative for dizziness, tingling, tremors, sensory change, speech change, focal weakness and loss of consciousness.  Endo/Heme/Allergies: Does not bruise/bleed easily.  Psychiatric/Behavioral: Negative for substance abuse. The patient is not nervous/anxious.   All other systems reviewed and are negative.   Labs:  Recent Labs  11/06/15 2131 11/07/15 0105 11/07/15 0350 11/07/15 0708  TROPONINI <0.03 <0.03 <0.03 <0.03   Lab Results  Component Value Date   WBC 9.1  11/07/2015   HGB 12.6 11/07/2015   HCT 35.7 11/07/2015   MCV  88.7 11/07/2015   PLT 155 11/07/2015     Recent Labs Lab 11/06/15 2131 11/07/15 0350  NA 138  --   K 3.4*  --   CL 103  --   CO2 25  --   BUN 13  --   CREATININE 0.66 0.65  CALCIUM 9.1  --   GLUCOSE 167*  --    Lab Results  Component Value Date   CHOL 134 11/07/2015   HDL 64 11/07/2015   LDLCALC 40 11/07/2015   TRIG 149 11/07/2015   No results found for: DDIMER  Radiology/Studies:  Dg Chest 2 View  Result Date: 11/06/2015 CLINICAL DATA:  Intermittent chest pain for 2 days EXAM: CHEST  2 VIEW COMPARISON:  12/28/2014 FINDINGS: Cardiac shadow is stable. Aortic calcifications are again noted. The lungs are well aerated bilaterally without focal infiltrate or sizable effusion. No acute bony abnormality is noted. IMPRESSION: No active cardiopulmonary disease. Electronically Signed   By: Inez Catalina M.D.   On: 11/06/2015 21:56   Ct Angio Chest Pe W And/or Wo Contrast  Result Date: 11/07/2015 CLINICAL DATA:  Acute onset LEFT chest pain radiating to the RIGHT, clammy sensation, spontaneous resolution. Similar recurrent symptoms today. Associated cough and mild fever. History of cardiac arrest, RIGHT lung carcinoid tumor. EXAM: CT ANGIOGRAPHY CHEST WITH CONTRAST TECHNIQUE: Multidetector CT imaging of the chest was performed using the standard protocol during bolus administration of intravenous contrast. Multiplanar CT image reconstructions and MIPs were obtained to evaluate the vascular anatomy. CONTRAST:  75 cc Isovue 370 COMPARISON:  Chest radiograph November 06, 2015 at 2141 hours and CT chest May 16, 2012 FINDINGS: CARDIOVASCULAR: Adequate contrast opacification of the pulmonary artery's. Main pulmonary artery is not enlarged. No pulmonary arterial filling defects to the level of the subsegmental branches. Heart size is normal, no right heart strain. Moderate coronary artery calcifications No pericardial effusions. Thoracic  aorta is normal course and caliber, moderate to severe calcific atherosclerosis. MEDIASTINUM/NODES: No lymphadenopathy by CT size criteria. LUNGS/PLEURA: Tracheobronchial tree is patent, no pneumothorax. Chronic small RIGHT pleural effusion, decreased in size from prior chest CT with pleural thickening. Surgical clips RIGHT hilum. Partial RIGHT lower lobe lobectomy. RIGHT lung base pneumatocele. Chronic focal coarsened interstitium RIGHT upper lobe. UPPER ABDOMEN: Included view of the abdomen is unremarkable. MUSCULOSKELETAL: Visualized soft tissues and included osseous structures are nonacute. Coarse calcification LEFT breast. Review of the MIP images confirms the above findings. IMPRESSION: No acute pulmonary embolism or acute cardiopulmonary process. Postsurgical changes RIGHT lung with chronic, decreased small RIGHT pleural effusion. Moderate coronary artery calcifications. Electronically Signed   By: Elon Alas M.D.   On: 11/07/2015 00:00    EKG: Interpreted by me showed: NSR, 73 bpm, LVH, no acute st/t changes  Telemetry: Interpreted by me showed: NSR, 70's bpm  Weights: [employer]   11/06/15 2120 11/07/15 0048  Weight: 157 lb 6.4 oz (71.4 kg) 178 lb 1.6 oz (80.8 kg)     Physical Exam: Blood pressure (!) 161/71, pulse 80, temperature 97.7 F (36.5 C), temperature source Oral, resp. rate 18, height 5\' 6"  (1.676 m), weight 178 lb 1.6 oz (80.8 kg), SpO2 97 %. Body mass index is 28.75 kg/m. General: Well developed, well nourished, in no acute distress. Head: Normocephalic, atraumatic, sclera non-icteric, no xanthomas, nares are without discharge.  Neck: Negative for carotid bruits. JVD not elevated. Lungs: Clear bilaterally to auscultation without wheezes, rales, or rhonchi. Breathing is unlabored. Heart: RRR with S1 S2. No murmurs, rubs, or gallops  appreciated. Abdomen: Soft, non-tender, non-distended with normoactive bowel sounds. No hepatomegaly. No rebound/guarding. No obvious  abdominal masses. Msk:  Strength and tone appear normal for age. Extremities: No clubbing or cyanosis. No edema. Distal pedal pulses are 2+ and equal bilaterally. Neuro: Alert and oriented X 3. No facial asymmetry. No focal deficit. Moves all extremities spontaneously. Psych:  Responds to questions appropriately with a normal affect.    Assessment and Plan:  Principal Problem:   Viral URI Active Problems:   Essential hypertension   Atypical chest pain   HYPERCHOLESTEROLEMIA  IIA   Coronary artery disease involving native coronary artery of native heart with angina pectoris with documented spasm (HCC)   Hypokalemia    1. Atypical chest pain: -Has ruled out -Chest pain in the setting of increased, dry cough 2/2 viral URI -Likely MSK in etiology -Check echo to evaluate LV systolic function and wall motion -She does not want invasive cardiac testing at this time unless absolutely indicated   2. Viral URI: -Supportive care per IM  3. CAD as above: -No plans for inpatient ischemic evaluation at this time unless echo shows newly reduced EF or WMA -Continue current medical therapy with ASA, Plavix, Imdur, Toprol, Lipitor -If needed could titrate Imdur or add Ranexa  4. Accelerated HTN: -Increase Imdur to 30 mg and Toprol XL to 75 mg daily -Continue amlodipine 10 mg, losartan 100 mg, and Lasix 20 mg daily  5. HLD: -Lipitor  6. Hypokalemia: -Replete to 4.0   Signed, Christell Faith, PA-C [facility] Pager: [phone] 11/07/2015, 9:04 AM

## 2015-11-07 NOTE — H&P (Signed)
[place] @ [facility] Admission History and Physical Harvie Bridge, D.O.  ---------------------------------------------------------------------------------------------------------------------   PATIENT NAME: [name] MR#: [id number] DATE OF BIRTH: [birth year] DATE OF ADMISSION: 11/06/2015 PRIMARY CARE PHYSICIAN: Mathews Argyle, MD  REQUESTING/REFERRING PHYSICIAN: ED Dr. Mariea Clonts  CHIEF COMPLAINT: Chief Complaint  Patient presents with  . Chest Pain    HISTORY OF PRESENT ILLNESS: [name] is a 82 y.o. female with a known history of Right carcinoid lung tumor status post partial resection, hypertension, hyperlipidemia, cardiac arrest remotely,  MI on Plavix and aspirin presents to the emergency department for evaluation of chest pain.  Patient was suffering for the last few days with upper respiratory symptoms such as nonproductive cough, fever and sore throat. Last night she developed left sided chest pain which is described as severe, initially localized and then radiating to the right side of the chest associated with diaphoresis, lightheadedness. She denied nausea, palpitations, shortness of breath. Patient states the pain resolved on its own however it recurred this morning and resolved when she took 2 Tylenol. This evening pain recurred, took 2 Tylenol and began but pain did not resolve. She came to the emergency department because of the persistent pain.  She states she has never had pain like this in the past and this presentation is different from her MI.  Otherwise there has been no change in status. Patient has been taking medication as prescribed and there has been no recent change in medication or diet.  There has been no recent illness, travel or sick contacts.    Patient denies chills, weakness, dizziness, shortness of breath, N/V/C/D, abdominal pain, dysuria/frequency, changes in mental status.   EMS/ED COURSE:   Patient received aspirin.  PAST  MEDICAL HISTORY: Past Medical History:  Diagnosis Date  . Anxiety   . Atypical carcinoid lung tumor ([place])   . CAD (coronary artery disease)    a. 1986: s/p PTCA of bifurcation lesion in LAD;  b. 2001: s/p BMS to LAD & LCX;  c. 04/2006 Cath: diff CAD w/o critical narrowing, nl EF;  d. 06/2010 Echo: EF 60-65%, mild AI/MR.  . Cancer ([place])   . Carotid disease, bilateral ([place])    a. RICA [id number], LICA [id number]  . Cataracts, bilateral   . Cholelithiases   . Depression   . Gilbert's syndrome   . HTN (hypertension)   . Hyperglycemia    mild  . Hyperlipidemia   . Impaired fasting blood sugar   . Keloid    mid sternum  . Macular degeneration   . Osteoarthritis   . Positional vertigo    benign  . Symptoms, such as flushing, sleeplessness, headache, lack of concentration, associated with the menopause   . TIA (transient ischemic attack)       PAST SURGICAL HISTORY: Past Surgical History:  Procedure Laterality Date  . carcinoid     removed from lung, [place]  . LEFT HEART CATHETERIZATION WITH CORONARY ANGIOGRAM N/A 04/27/2012   Procedure: LEFT HEART CATHETERIZATION WITH CORONARY ANGIOGRAM;  Surgeon: Peter M Martinique, MD;  Location: [facility] CATH LAB;  Service: Cardiovascular;  Laterality: N/A;  . pci  1986  . pci of mid cfx     with non des and cutting balloon pci of av circumflex  . pic of lad  2001   with non des lad       SOCIAL HISTORY: Social History  Substance Use Topics  . Smoking status: Never Smoker  . Smokeless tobacco: Never Used  . Alcohol  use No      FAMILY HISTORY: Family History  Problem Relation Age of Onset  . Diabetes Mellitus I Mother   . CAD Mother   . CAD Father   . Diabetes Mellitus I Sister   . Diabetes Mellitus I Brother   . Diabetes Mellitus I Sister      MEDICATIONS AT HOME: Prior to Admission medications   Medication Sig Start Date End Date Taking? Authorizing Provider  amLODipine (NORVASC) 10 MG tablet Take 10 mg by mouth daily.   Yes Historical  Provider, MD  aspirin 81 MG tablet Take 81 mg by mouth daily.   Yes Historical Provider, MD  atorvastatin (LIPITOR) 10 MG tablet Take 10 mg by mouth daily.   Yes Historical Provider, MD  Calcium Carbonate-Vitamin D (CALCIUM 600 + D PO) Take 1 tablet by mouth daily. 600 mg/ 400 mg   Yes Historical Provider, MD  clopidogrel (PLAVIX) 75 MG tablet Take 75 mg by mouth daily.   Yes Historical Provider, MD  estrogens, conjugated, (PREMARIN) 0.3 MG tablet Take 0.3 mg by mouth daily. Take daily for 21 days then do not take for 7 days.   Yes Historical Provider, MD  furosemide (LASIX) 20 MG tablet Take 20 mg by mouth daily.   Yes Historical Provider, MD  isosorbide mononitrate (IMDUR) 30 MG 24 hr tablet Take 0.5 tablets (15 mg total) by mouth daily. 02/13/15  Yes Ryan M Dunn, PA-C  KLOR-CON M20 20 MEQ tablet TAKE 1 TABLET (20 MEQ TOTAL) BY MOUTH 2 (TWO) TIMES DAILY. 08/27/15  Yes Minna Merritts, MD  LORazepam (ATIVAN) 0.5 MG tablet Take 0.5 mg by mouth every 8 (eight) hours as needed for anxiety.    Yes Historical Provider, MD  losartan (COZAAR) 50 MG tablet Take 50 mg by mouth daily.   Yes Historical Provider, MD  metoprolol succinate (TOPROL-XL) 50 MG 24 hr tablet TAKE 1 TABLET (50 MG TOTAL) BY MOUTH DAILY. TAKE WITH OR IMMEDIATELY FOLLOWING A MEAL. 08/19/15  Yes Minna Merritts, MD  Multiple Vitamin (MULTIVITAMIN) tablet Take 1 tablet by mouth daily.   Yes Historical Provider, MD  nitroGLYCERIN (NITROSTAT) 0.4 MG SL tablet Place 1 tablet (0.4 mg total) under the tongue every 5 (five) minutes as needed for chest pain. 05/13/15  Yes Minna Merritts, MD  omeprazole (PRILOSEC) 20 MG capsule Take 20 mg by mouth daily.   Yes Historical Provider, MD      DRUG ALLERGIES: Allergies  Allergen Reactions  . Biaxin [Clarithromycin]     depressed  . Celecoxib Other (See Comments)    Reaction: Unknown  . Codeine Other (See Comments)    Reaction: Unknown  . Lisinopril     cough  . Macrobid [employer]  Macro] Other (See Comments)    Reaction: Unknown  . Oxycodone Hcl Other (See Comments)    Reaction: Unknown  . Penicillins Other (See Comments)    Reaction: Unknown  . Sertraline Hcl Other (See Comments)    Reaction: Unknown     REVIEW OF SYSTEMS: CONSTITUTIONAL: No fatigue, weakness, fever, chills, weight gain/loss, headache EYES: No blurry or double vision. ENT: No tinnitus, postnasal drip, redness or soreness of the oropharynx. RESPIRATORY: No dyspnea, Positive cough, negative wheeze, hemoptysis. CARDIOVASCULAR: Positive chest pain, negative orthopnea, palpitations, syncope. GASTROINTESTINAL: No nausea, vomiting, constipation, diarrhea, abdominal pain. No hematemesis, melena or hematochezia. GENITOURINARY: No dysuria, frequency, hematuria. ENDOCRINE: No polyuria or nocturia. No heat or cold intolerance. HEMATOLOGY: No anemia, bruising, bleeding. INTEGUMENTARY: No  rashes, ulcers, lesions. MUSCULOSKELETAL: No pain, arthritis, swelling, gout. NEUROLOGIC: No numbness, tingling, weakness or ataxia. No seizure-type activity. PSYCHIATRIC: No anxiety, depression, insomnia.  PHYSICAL EXAMINATION: VITAL SIGNS: Blood pressure (!) 154/69, pulse 67, temperature 98.9 F (37.2 C), temperature source Oral, resp. rate 15, height 5\' 6"  (1.676 m), weight 71.4 kg (157 lb 6.4 oz), SpO2 96 %.  GENERAL: 82 y.o.-year-old white female patient, well-developed, well-nourished lying in the bed in no acute distress.  Pleasant and cooperative.   HEENT: Head atraumatic, normocephalic. Pupils equal, round, reactive to light and accommodation. No scleral icterus. Extraocular muscles intact. Oropharynx is clear. Mucus membranes moist. NECK: Supple, full range of motion. No JVD. Positive bruit bilaterally No cervical lymphadenopathy. CHEST: Normal breath sounds bilaterally. No wheezing, rales, rhonchi or crackles. No use of accessory muscles of respiration.  No reproducible chest wall tenderness.  CARDIOVASCULAR:  S1, S2 normal. Positive systolic ejection murmur that radiates to the carotids bilaterally Cap refill <2 seconds. ABDOMEN: Soft, nontender, nondistended. No rebound, guarding, rigidity. Normoactive bowel sounds present in all four quadrants. No organomegaly or mass. EXTREMITIES: Full range of motion. No pedal edema, cyanosis, or clubbing. NEUROLOGIC: Cranial nerves II through XII are grossly intact with no focal sensorimotor deficit. Muscle strength 5/5 in all extremities. Sensation intact. Gait not checked. PSYCHIATRIC: The patient is alert and oriented x 3. Normal affect, mood, thought content. SKIN: Warm, dry, and intact without obvious rash, lesion, or ulcer.  LABORATORY PANEL:  CBC  Recent Labs Lab 11/06/15 2131  WBC 10.6  HGB 13.7  HCT 39.0  PLT 175   ----------------------------------------------------------------------------------------------------------------- Chemistries  Recent Labs Lab 11/06/15 2131  NA 138  K 3.4*  CL 103  CO2 25  GLUCOSE 167*  BUN 13  CREATININE 0.66  CALCIUM 9.1   ------------------------------------------------------------------------------------------------------------------ Cardiac Enzymes  Recent Labs Lab 11/06/15 2131  TROPONINI <0.03   ------------------------------------------------------------------------------------------------------------------  RADIOLOGY: Dg Chest 2 View  Result Date: 11/06/2015 CLINICAL DATA:  Intermittent chest pain for 2 days EXAM: CHEST  2 VIEW COMPARISON:  12/28/2014 FINDINGS: Cardiac shadow is stable. Aortic calcifications are again noted. The lungs are well aerated bilaterally without focal infiltrate or sizable effusion. No acute bony abnormality is noted. IMPRESSION: No active cardiopulmonary disease. Electronically Signed   By: Inez Catalina M.D.   On: 11/06/2015 21:56   Ct Angio Chest Pe W And/or Wo Contrast  Result Date: 11/07/2015 CLINICAL DATA:  Acute onset LEFT chest pain radiating to the  RIGHT, clammy sensation, spontaneous resolution. Similar recurrent symptoms today. Associated cough and mild fever. History of cardiac arrest, RIGHT lung carcinoid tumor. EXAM: CT ANGIOGRAPHY CHEST WITH CONTRAST TECHNIQUE: Multidetector CT imaging of the chest was performed using the standard protocol during bolus administration of intravenous contrast. Multiplanar CT image reconstructions and MIPs were obtained to evaluate the vascular anatomy. CONTRAST:  75 cc Isovue 370 COMPARISON:  Chest radiograph November 06, 2015 at 2141 hours and CT chest May 16, 2012 FINDINGS: CARDIOVASCULAR: Adequate contrast opacification of the pulmonary artery's. Main pulmonary artery is not enlarged. No pulmonary arterial filling defects to the level of the subsegmental branches. Heart size is normal, no right heart strain. Moderate coronary artery calcifications No pericardial effusions. Thoracic aorta is normal course and caliber, moderate to severe calcific atherosclerosis. MEDIASTINUM/NODES: No lymphadenopathy by CT size criteria. LUNGS/PLEURA: Tracheobronchial tree is patent, no pneumothorax. Chronic small RIGHT pleural effusion, decreased in size from prior chest CT with pleural thickening. Surgical clips RIGHT hilum. Partial RIGHT lower lobe lobectomy. RIGHT lung base pneumatocele. Chronic  focal coarsened interstitium RIGHT upper lobe. UPPER ABDOMEN: Included view of the abdomen is unremarkable. MUSCULOSKELETAL: Visualized soft tissues and included osseous structures are nonacute. Coarse calcification LEFT breast. Review of the MIP images confirms the above findings. IMPRESSION: No acute pulmonary embolism or acute cardiopulmonary process. Postsurgical changes RIGHT lung with chronic, decreased small RIGHT pleural effusion. Moderate coronary artery calcifications. Electronically Signed   By: Elon Alas M.D.   On: 11/07/2015 00:00    EKG: Normal sinus rhythm at 73 bpm with normal axis and nonspecific ST-T wave changes.  [facility]  IMPRESSION AND PLAN:  This is a 82 y.o. female with a history of right carcinoid lung tumor status post partial resection, hypertension, hyperlipidemia, remote cardiac arrest MI on Plavix and aspirin, bilateral carotid stenosis now being admitted with: 1. Chest pain, rule out ACS - Admit to observation with telemetry monitoring. - Trend troponins, check lipids and TSH. - Morphine, nitro, beta blocker, aspirin and statin ordered.  Continue her home medications including Norvasc, Plavix, Imdur, Lasix - Echocardiogram ordered secondary to systolic ejection murmur., Potassium  Complete carotid Dopplers were done in August 2017 - Cardiology consult requested.  2. History of GERD-continue Prilosec 3. History of anxiety/depression-continue Ativan as needed   Diet/Nutrition: Heart healthy Fluids: NS DVT Px: Lovenox, SCDs and early ambulation Code Status: Full  All the records are reviewed and case discussed with ED provider. Management plans discussed with the patient and/or family who express understanding and agree with plan of care.   TOTAL TIME TAKING CARE OF THIS PATIENT: 60 minutes.   Kassius Battiste D.O. on 11/07/2015 at 12:38 AM Between 7am to 6pm - Pager - [phone] After 6pm go to [email or web address] - [employer] [facility] [place] [facility] Office [phone] CC: Primary care physician; Mathews Argyle, MD     Note: This dictation was prepared with Dragon dictation along with smaller phrase technology. Any transcriptional errors that result from this process are unintentional.

## 2015-11-07 NOTE — Care Management (Signed)
Placed in observation for chest pain.  Troponins negative thus far.  Cardiology consult pending. Independent in all adls, no issues accessing medical care, obtaining medications or with transportation.  Current with PCP.  No discharge needs identified at present by care manager or members of care team

## 2015-11-07 NOTE — Discharge Summary (Signed)
[facility] NAME: [name]    MR#:  [id number]  DATE OF BIRTH:  [birth year]  DATE OF ADMISSION:  11/06/2015 ADMITTING PHYSICIAN: Harvie Bridge, DO  DATE OF DISCHARGE: 11/07/2015 PRIMARY CARE PHYSICIAN: Mathews Argyle, MD    ADMISSION DIAGNOSIS:  Shortness of breath [R06.02] Cough [R05] Diaphoresis [R61] Sore throat [J02.9] Chest pain, unspecified type [R07.9]  DISCHARGE DIAGNOSIS:  Principal Problem:   Viral URI Active Problems:   HYPERCHOLESTEROLEMIA  IIA   Essential hypertension   Coronary artery disease involving native coronary artery of native heart with angina pectoris with documented spasm (HCC)   Atypical chest pain   Hypokalemia   Cough   Diaphoresis   Sore throat   SECONDARY DIAGNOSIS:   Past Medical History:  Diagnosis Date  . Anxiety   . Atypical carcinoid lung tumor ([place])   . CAD (coronary artery disease)    a. 1986: s/p PTCA of bifurcation lesion in LAD;  b. 2001: s/p BMS to LAD & LCX;  c. 04/2006 Cath: diff CAD w/o critical narrowing, nl EF;  d. 06/2010 Echo: EF 60-65%, mild AI/MR.  . Cancer ([place])   . Carotid disease, bilateral ([place])    a. RICA [id number], LICA [id number]  . Cataracts, bilateral   . Cholelithiases   . Depression   . Gilbert's syndrome   . HTN (hypertension)   . Hyperglycemia    mild  . Hyperlipidemia   . Impaired fasting blood sugar   . Keloid    mid sternum  . Macular degeneration   . Osteoarthritis   . Positional vertigo    benign  . Symptoms, such as flushing, sleeplessness, headache, lack of concentration, associated with the menopause   . TIA (transient ischemic attack)     HOSPITAL COURSE:   This is a 82 y.o. female with a history of right carcinoid lung tumor status post partial resection, hypertension, hyperlipidemia, remote cardiac arrest MI on Plavix and aspirin, bilateral carotid stenosis . Review history and physical for details  1. Chest  pain-atypical probably from viral URI  ruled out ACS -Echo normal with normal ejection fraction -Acute MI ruled out with 3 negative troponins -CT chest negative for pulmonary embolism TSH normal 1.472 -LDL 40 Okay to discharge patient from cardiology Dr. Rockey Situ standpoint- - Continue her home medications including Norvasc, Plavix, Imdur, Lasix -   Complete carotid Dopplers were done in August 2017 -Chest pain is probably from congestion from viral URI - provide symptomatic treatment and take Tylenol as needed   2. History of GERD-continue Prilosec  3. History of anxiety/depression-continue Ativan as needed  DISCHARGE CONDITIONS:   Fair  CONSULTS OBTAINED:  Treatment Team:  Minna Merritts, MD   PROCEDURES None  DRUG ALLERGIES:   Allergies  Allergen Reactions  . Biaxin [Clarithromycin]     depressed  . Celecoxib Other (See Comments)    Reaction: Unknown  . Codeine Other (See Comments)    Reaction: Unknown  . Lisinopril     cough  . Macrobid [employer] Macro] Other (See Comments)    Reaction: Unknown  . Oxycodone Hcl Other (See Comments)    Reaction: Unknown  . Penicillins Other (See Comments)    Reaction: Unknown  . Sertraline Hcl Other (See Comments)    Reaction: Unknown    DISCHARGE MEDICATIONS:   Current Discharge Medication List    START taking these medications   Details  acetaminophen (TYLENOL) 325 MG tablet Take 2  tablets (650 mg total) by mouth every 6 (six) hours as needed for mild pain, fever or headache.    guaiFENesin-dextromethorphan (ROBITUSSIN DM) 100-10 MG/5ML syrup Take 10 mLs by mouth every 6 (six) hours as needed for cough. Qty: 118 mL, Refills: 0      CONTINUE these medications which have NOT CHANGED   Details  amLODipine (NORVASC) 10 MG tablet Take 10 mg by mouth daily.    aspirin 81 MG tablet Take 81 mg by mouth daily.    atorvastatin (LIPITOR) 10 MG tablet Take 10 mg by mouth daily.    Calcium Carbonate-Vitamin D  (CALCIUM 600 + D PO) Take 1 tablet by mouth daily. 600 mg/ 400 mg    clopidogrel (PLAVIX) 75 MG tablet Take 75 mg by mouth daily.   Associated Diagnoses: Coronary atherosclerosis of unspecified type of vessel, native or graft; UTI (urinary tract infection)    estrogens, conjugated, (PREMARIN) 0.3 MG tablet Take 0.3 mg by mouth daily. Take daily for 21 days then do not take for 7 days.    furosemide (LASIX) 20 MG tablet Take 20 mg by mouth daily.    isosorbide mononitrate (IMDUR) 30 MG 24 hr tablet Take 0.5 tablets (15 mg total) by mouth daily. Qty: 30 tablet, Refills: 6    KLOR-CON M20 20 MEQ tablet TAKE 1 TABLET (20 MEQ TOTAL) BY MOUTH 2 (TWO) TIMES DAILY. Qty: 90 tablet, Refills: 3    LORazepam (ATIVAN) 0.5 MG tablet Take 0.5 mg by mouth every 8 (eight) hours as needed for anxiety.     losartan (COZAAR) 50 MG tablet Take 50 mg by mouth daily.    metoprolol succinate (TOPROL-XL) 50 MG 24 hr tablet TAKE 1 TABLET (50 MG TOTAL) BY MOUTH DAILY. TAKE WITH OR IMMEDIATELY FOLLOWING A MEAL. Qty: 90 tablet, Refills: 2    Multiple Vitamin (MULTIVITAMIN) tablet Take 1 tablet by mouth daily.    nitroGLYCERIN (NITROSTAT) 0.4 MG SL tablet Place 1 tablet (0.4 mg total) under the tongue every 5 (five) minutes as needed for chest pain. Qty: 25 tablet, Refills: 6    omeprazole (PRILOSEC) 20 MG capsule Take 20 mg by mouth daily.         DISCHARGE INSTRUCTIONS:   Activity as tolerated Follow-up with primary care physician in a week Follow-up with primary cardiology Dr. Rockey Situ in a month   DIET:  Cardiac diet  DISCHARGE CONDITION:  Fair  ACTIVITY:  Activity as tolerated  OXYGEN:  Home Oxygen: No.   Oxygen Delivery: room air  DISCHARGE LOCATION:  home   If you experience worsening of your admission symptoms, develop shortness of breath, life threatening emergency, suicidal or homicidal thoughts you must seek medical attention immediately by calling 911 or calling your MD  immediately  if symptoms less severe.  You Must read complete instructions/literature along with all the possible adverse reactions/side effects for all the Medicines you take and that have been prescribed to you. Take any new Medicines after you have completely understood and accpet all the possible adverse reactions/side effects.   Please note  You were cared for by a hospitalist during your hospital stay. If you have any questions about your discharge medications or the care you received while you were in the hospital after you are discharged, you can call the unit and asked to speak with the hospitalist on call if the hospitalist that took care of you is not available. Once you are discharged, your primary care physician will handle any further medical  issues. Please note that NO REFILLS for any discharge medications will be authorized once you are discharged, as it is imperative that you return to your primary care physician (or establish a relationship with a primary care physician if you do not have one) for your aftercare needs so that they can reassess your need for medications and monitor your lab values.     Today  Chief Complaint  Patient presents with  . Chest Pain   Patient is reporting a little bit cough and congestion. Chest hurts with cough. Denies any dizziness   ROS:  CONSTITUTIONAL: Denies fevers, chills. Denies any fatigue, weakness.  EYES: Denies blurry vision, double vision, eye pain. EARS, NOSE, THROAT: Denies tinnitus, ear pain, hearing loss. RESPIRATORY:   reports some cough, denies  wheeze, shortness of breath.  CARDIOVASCULAR: Denies chest pain, palpitations, edema.  GASTROINTESTINAL: Denies nausea, vomiting, diarrhea, abdominal pain. Denies bright red blood per rectum. GENITOURINARY: Denies dysuria, hematuria. ENDOCRINE: Denies nocturia or thyroid problems. HEMATOLOGIC AND LYMPHATIC: Denies easy bruising or bleeding. SKIN: Denies rash or  lesion. MUSCULOSKELETAL: Denies pain in neck, back, shoulder, knees, hips or arthritic symptoms.  NEUROLOGIC: Denies paralysis, paresthesias.  PSYCHIATRIC: Denies anxiety or depressive symptoms.   VITAL SIGNS:  Blood pressure (!) 174/72, pulse 77, temperature 97.8 F (36.6 C), resp. rate 16, height 5\' 6"  (1.676 m), weight 80.8 kg (178 lb 1.6 oz), SpO2 97 %.  I/O:    Intake/Output Summary (Last 24 hours) at 11/07/15 1401 Last data filed at 11/07/15 1344  Gross per 24 hour  Intake          1283.25 ml  Output             1750 ml  Net          -466.75 ml    PHYSICAL EXAMINATION:  GENERAL:  82 y.o.-year-old patient lying in the bed with no acute distress.  EYES: Pupils equal, round, reactive to light and accommodation. No scleral icterus. Extraocular muscles intact.  HEENT: Head atraumatic, normocephalic. Oropharynx and nasopharynx clear.  NECK:  Supple, no jugular venous distention. No thyroid enlargement, no tenderness.  LUNGS: Moderate  breath sounds bilaterally, no wheezing, rales,rhonchi or crepitation. No use of accessory muscles of respiration.  CARDIOVASCULAR: S1, S2 normal. No murmurs, rubs, or gallops.  ABDOMEN: Soft, non-tender, non-distended. Bowel sounds present. No organomegaly or mass.  EXTREMITIES: No pedal edema, cyanosis, or clubbing.  NEUROLOGIC: Cranial nerves II through XII are intact. Muscle strength 5/5 in all extremities. Sensation intact. Gait not checked.  PSYCHIATRIC: The patient is alert and oriented x 3.  SKIN: No obvious rash, lesion, or ulcer.   DATA REVIEW:   CBC  Recent Labs Lab 11/07/15 0350  WBC 9.1  HGB 12.6  HCT 35.7  PLT 155    Chemistries   Recent Labs Lab 11/06/15 2131 11/07/15 0350  NA 138  --   K 3.4*  --   CL 103  --   CO2 25  --   GLUCOSE 167*  --   BUN 13  --   CREATININE 0.66 0.65  CALCIUM 9.1  --     Cardiac Enzymes  Recent Labs Lab 11/07/15 0708  TROPONINI <0.03    Microbiology Results  No results found  for this or any previous visit.  RADIOLOGY:  Dg Chest 2 View  Result Date: 11/06/2015 CLINICAL DATA:  Intermittent chest pain for 2 days EXAM: CHEST  2 VIEW COMPARISON:  12/28/2014 FINDINGS: Cardiac shadow is stable. Aortic calcifications  are again noted. The lungs are well aerated bilaterally without focal infiltrate or sizable effusion. No acute bony abnormality is noted. IMPRESSION: No active cardiopulmonary disease. Electronically Signed   By: Inez Catalina M.D.   On: 11/06/2015 21:56   Ct Angio Chest Pe W And/or Wo Contrast  Result Date: 11/07/2015 CLINICAL DATA:  Acute onset LEFT chest pain radiating to the RIGHT, clammy sensation, spontaneous resolution. Similar recurrent symptoms today. Associated cough and mild fever. History of cardiac arrest, RIGHT lung carcinoid tumor. EXAM: CT ANGIOGRAPHY CHEST WITH CONTRAST TECHNIQUE: Multidetector CT imaging of the chest was performed using the standard protocol during bolus administration of intravenous contrast. Multiplanar CT image reconstructions and MIPs were obtained to evaluate the vascular anatomy. CONTRAST:  75 cc Isovue 370 COMPARISON:  Chest radiograph November 06, 2015 at 2141 hours and CT chest May 16, 2012 FINDINGS: CARDIOVASCULAR: Adequate contrast opacification of the pulmonary artery's. Main pulmonary artery is not enlarged. No pulmonary arterial filling defects to the level of the subsegmental branches. Heart size is normal, no right heart strain. Moderate coronary artery calcifications No pericardial effusions. Thoracic aorta is normal course and caliber, moderate to severe calcific atherosclerosis. MEDIASTINUM/NODES: No lymphadenopathy by CT size criteria. LUNGS/PLEURA: Tracheobronchial tree is patent, no pneumothorax. Chronic small RIGHT pleural effusion, decreased in size from prior chest CT with pleural thickening. Surgical clips RIGHT hilum. Partial RIGHT lower lobe lobectomy. RIGHT lung base pneumatocele. Chronic focal coarsened  interstitium RIGHT upper lobe. UPPER ABDOMEN: Included view of the abdomen is unremarkable. MUSCULOSKELETAL: Visualized soft tissues and included osseous structures are nonacute. Coarse calcification LEFT breast. Review of the MIP images confirms the above findings. IMPRESSION: No acute pulmonary embolism or acute cardiopulmonary process. Postsurgical changes RIGHT lung with chronic, decreased small RIGHT pleural effusion. Moderate coronary artery calcifications. Electronically Signed   By: Elon Alas M.D.   On: 11/07/2015 00:00    EKG:   Orders placed or performed during the hospital encounter of 11/06/15  . ED EKG  . ED EKG  . EKG 12-Lead  . EKG 12-Lead  . EKG 12-Lead (at 6am)  . EKG 12-Lead (Repeat cardiac markers, recurrent chest pain)  . EKG 12-Lead (at 6am)  . EKG 12-Lead (Repeat cardiac markers, recurrent chest pain)  . EKG 12-Lead  . EKG 12-Lead  . EKG 12-Lead  . EKG 12-Lead  . EKG 12-Lead  . EKG 12-Lead      Management plans discussed with the patient, family and they are in agreement.  CODE STATUS:     Code Status Orders        Start     Ordered   11/07/15 0050  Full code  Continuous     11/07/15 0049    Code Status History    Date Active Date Inactive Code Status Order ID Comments User Context   12/28/2014  9:16 AM 12/30/2014  4:33 PM Full Code [id number]  Fritzi Mandes, MD Inpatient    Advance Directive Documentation   Flowsheet Row Most Recent Value  Type of Advance Directive  Healthcare Power of Attorney, Living will  Pre-existing out of facility DNR order (yellow form or pink MOST form)  No data  "MOST" Form in Place?  No data      TOTAL TIME TAKING CARE OF THIS PATIENT: 42 minutes.   Note: This dictation was prepared with Dragon dictation along with smaller phrase technology. Any transcriptional errors that result from this process are unintentional.   @MEC @  on 11/07/2015 at 2:01 PM  Between 7am to 6pm - Pager - [phone]  After 6pm go  to [email or web address] - password [email or web address] [facility]  [facility]  Office  [phone]  CC: Primary care physician; Mathews Argyle, MD

## 2015-11-07 NOTE — Progress Notes (Signed)
Discharge instructions along with home medication list and follow up gone over with patient. Grandson at bedside. Patient verbalized that she understood instructions. One tylenol rx given to patient, other rx was electronically submitted to pharmacy. Iv was removed, tele removed. Patient to be discharged home on RA. No distress noted.

## 2015-11-07 NOTE — Discharge Instructions (Signed)
Activity as tolerated Follow-up with primary care physician in a week Follow-up with primary cardiology Dr. Rockey Situ in a month

## 2015-11-07 NOTE — Progress Notes (Signed)
*  PRELIMINARY RESULTS* Echocardiogram 2D Echocardiogram has been performed.  [name] 11/07/2015, 11:49 AM

## 2015-11-07 NOTE — Care Management Obs Status (Signed)
[facility] NOTIFICATION   Patient Details  Name: [name] MRN: [id number] Date of Birth: August 20, 1930   Medicare Observation Status Notification Given:  Yes    Beau Fanny, RN 11/07/2015, 9:11 AM

## 2015-11-09 LAB — CULTURE, GROUP A STREP (THRC)

## 2015-11-12 ENCOUNTER — Ambulatory Visit: Payer: Self-pay | Admitting: Cardiovascular Disease

## 2015-11-12 DIAGNOSIS — J209 Acute bronchitis, unspecified: Secondary | ICD-10-CM | POA: Diagnosis not present

## 2015-12-09 ENCOUNTER — Ambulatory Visit (INDEPENDENT_AMBULATORY_CARE_PROVIDER_SITE_OTHER): Payer: Medicare Other | Admitting: Cardiovascular Disease

## 2015-12-09 ENCOUNTER — Encounter: Payer: Self-pay | Admitting: Cardiovascular Disease

## 2015-12-09 VITALS — BP 138/62 | HR 63 | Ht 67.0 in | Wt 179.5 lb

## 2015-12-09 DIAGNOSIS — Z23 Encounter for immunization: Secondary | ICD-10-CM

## 2015-12-09 DIAGNOSIS — I6522 Occlusion and stenosis of left carotid artery: Secondary | ICD-10-CM

## 2015-12-09 DIAGNOSIS — I10 Essential (primary) hypertension: Secondary | ICD-10-CM

## 2015-12-09 DIAGNOSIS — I493 Ventricular premature depolarization: Secondary | ICD-10-CM

## 2015-12-09 DIAGNOSIS — I25111 Atherosclerotic heart disease of native coronary artery with angina pectoris with documented spasm: Secondary | ICD-10-CM | POA: Diagnosis not present

## 2015-12-09 DIAGNOSIS — R29898 Other symptoms and signs involving the musculoskeletal system: Secondary | ICD-10-CM | POA: Insufficient documentation

## 2015-12-09 DIAGNOSIS — I208 Other forms of angina pectoris: Secondary | ICD-10-CM | POA: Diagnosis not present

## 2015-12-09 DIAGNOSIS — I209 Angina pectoris, unspecified: Secondary | ICD-10-CM | POA: Diagnosis not present

## 2015-12-09 NOTE — Patient Instructions (Signed)

## 2015-12-09 NOTE — Progress Notes (Signed)
Cardiology Office Note  Date:  12/09/2015   ID:  [name], DOB [birth year], MRN [id number]  PCP:  Mathews Argyle, MD   Chief Complaint  Patient presents with  . othe r    6 month follow up. Pt. was at [facility] with shoulder and chest pain in Oct. 2017.     HPI:  82 yo woman with CAD, prior PCI of the LAD in 2001 with non DES, PCI of mid LCX with non des and cutting balloon, chronic chest pain episodes,  cardiac catheterization April 2014 showing occluded RCA, patent LAD and left circumflex and presents for followup for CAD . She has 50% carotid disease on the left.  She presents today for follow-up of her coronary artery disease, frequent PVCs, hypertension, carotid arterial disease 50% carotid disease on the left Chronic leg weakness  In follow-up today she reports that she is still getting over recent hospitalization for bronchitis In hospital 10/25, d/c 11/07/2015  Legs feel weak, unable to walk very far without giving out Able to go shopping but only goes for a short period of time before she is tired, has to rest She does not have a regular exercise program  Poor sleep, 6 hours, naps during the daytime Denies having any significant chest pain  Lab work reviewed today Total chol 134, LDL 40  EKG on today's visit shows normal sinus rhythm with rate 63 bpm, PVCs in a bigeminal pattern  Other past medical history reviewed Previously reported having chest tightness, rare episodes that wake her at night One episode at 4 AM, low-grade chest tightness, then she resolved on its own. Did not take nitroglycerin dramatic improvement in her symptoms on isosorbide 15 mg daily.  Long history of GI issues, diarrhea, has to take extra potassium daily Most recent potassium above 3.5 at the end of December 2016  Chronic leg weakness, does her ADLs, does not work in the garden anymore, unable to get up if she is on the ground   seen in the hospital 11/03/2013 for chest pain. Workup  was negative. Stress test was offered but she declined Blood pressure was elevated in the hospital, likely exacerbated by anxiety. Losartan was increased up to 100 mg daily  Previous lab work total cholesterol 136, LDL 14, HDL 56, hemoglobin A1c 5.8, TSH 3.7 Echocardiogram 11/04/2013 was reviewed with her showing normal LV systolic function, moderately elevated right ventricular systolic pressure, otherwise normal study  echocardiogram in 2012 demonstrated well-preserved left ventricular function, and trace AI.   3 vessel PTCA at [facility] in 1986 and then had a stent placed several years ago  (non DES).     PMH:   has a past medical history of Anxiety; Atypical carcinoid lung tumor ([place]); CAD (coronary artery disease); Cancer ([place]); Carotid disease, bilateral ([place]); Cataracts, bilateral; Cholelithiases; Depression; Gilbert's syndrome; HTN (hypertension); Hyperglycemia; Hyperlipidemia; Impaired fasting blood sugar; Keloid; Macular degeneration; Osteoarthritis; Positional vertigo; Symptoms, such as flushing, sleeplessness, headache, lack of concentration, associated with the menopause; and TIA (transient ischemic attack).  PSH:    Past Surgical History:  Procedure Laterality Date  . carcinoid     removed from lung, [place]  . LEFT HEART CATHETERIZATION WITH CORONARY ANGIOGRAM N/A 04/27/2012   Procedure: LEFT HEART CATHETERIZATION WITH CORONARY ANGIOGRAM;  Surgeon: Peter M Martinique, MD;  Location: [facility] CATH LAB;  Service: Cardiovascular;  Laterality: N/A;  . pci  1986  . pci of mid cfx     with non des and cutting balloon pci of  av circumflex  . pic of lad  2001   with non des lad     Current Outpatient Prescriptions  Medication Sig Dispense Refill  . acetaminophen (TYLENOL) 325 MG tablet Take 2 tablets (650 mg total) by mouth every 6 (six) hours as needed for mild pain, fever or headache.    Marland Kitchen amLODipine (NORVASC) 10 MG tablet Take 10 mg by mouth daily.    Marland Kitchen aspirin 81 MG tablet Take 81 mg by  mouth daily.    Marland Kitchen atorvastatin (LIPITOR) 10 MG tablet Take 10 mg by mouth daily.    . Calcium Carbonate-Vitamin D (CALCIUM 600 + D PO) Take 1 tablet by mouth daily. 600 mg/ 400 mg    . clopidogrel (PLAVIX) 75 MG tablet Take 75 mg by mouth daily.    Marland Kitchen estrogens, conjugated, (PREMARIN) 0.3 MG tablet Take 0.3 mg by mouth daily. Take daily for 21 days then do not take for 7 days.    . furosemide (LASIX) 20 MG tablet Take 20 mg by mouth daily.    Marland Kitchen guaiFENesin-dextromethorphan (ROBITUSSIN DM) 100-10 MG/5ML syrup Take 10 mLs by mouth every 6 (six) hours as needed for cough. 118 mL 0  . isosorbide mononitrate (IMDUR) 30 MG 24 hr tablet Take 0.5 tablets (15 mg total) by mouth daily. 30 tablet 6  . KLOR-CON M20 20 MEQ tablet TAKE 1 TABLET (20 MEQ TOTAL) BY MOUTH 2 (TWO) TIMES DAILY. 90 tablet 3  . LORazepam (ATIVAN) 0.5 MG tablet Take 0.5 mg by mouth every 8 (eight) hours as needed for anxiety.     Marland Kitchen losartan (COZAAR) 50 MG tablet Take 50 mg by mouth daily.    . metoprolol succinate (TOPROL-XL) 50 MG 24 hr tablet TAKE 1 TABLET (50 MG TOTAL) BY MOUTH DAILY. TAKE WITH OR IMMEDIATELY FOLLOWING A MEAL. 90 tablet 2  . Multiple Vitamin (MULTIVITAMIN) tablet Take 1 tablet by mouth daily.    . nitroGLYCERIN (NITROSTAT) 0.4 MG SL tablet Place 1 tablet (0.4 mg total) under the tongue every 5 (five) minutes as needed for chest pain. 25 tablet 6  . omeprazole (PRILOSEC) 20 MG capsule Take 20 mg by mouth daily.     No current facility-administered medications for this visit.      Allergies:   Biaxin [clarithromycin]; Celecoxib; Codeine; Lisinopril; Macrobid [nitrofurantoin monohyd macro]; Oxycodone hcl; Penicillins; and Sertraline hcl   Social History:  The patient  reports that she has never smoked. She has never used smokeless tobacco. She reports that she does not drink alcohol or use drugs.   Family History:   family history includes CAD in her father and mother; Diabetes Mellitus I in her brother, mother,  sister, and sister.    Review of Systems: Review of Systems  Constitutional: Positive for malaise/fatigue.  Respiratory: Negative.   Cardiovascular: Negative.   Gastrointestinal: Negative.   Musculoskeletal: Negative.        Leg weakness  Neurological: Positive for weakness.  Psychiatric/Behavioral: Negative.   All other systems reviewed and are negative.    PHYSICAL EXAM: VS:  BP 138/62 (BP Location: Left Arm, Patient Position: Sitting, Cuff Size: Normal)   Pulse 63   Ht 5\' 7"  (1.702 m)   Wt 179 lb 8 oz (81.4 kg)   BMI 28.11 kg/m  , BMI Body mass index is 28.11 kg/m. GEN: Well nourished, well developed, in no acute distress  HEENT: normal  Neck: no JVD, carotid bruits, or masses Cardiac: RRR; no murmurs, rubs, or gallops,no edema  Respiratory:  clear to auscultation bilaterally, normal work of breathing GI: soft, nontender, nondistended, + BS MS: no deformity or atrophy  Skin: warm and dry, no rash Neuro:  Strength and sensation are intact Psych: euthymic mood, full affect    Recent Labs: 11/06/2015: BUN 13; Potassium 3.4; Sodium 138 11/07/2015: Creatinine, Ser 0.65; Hemoglobin 12.6; Platelets 155; TSH 1.472    Lipid Panel Lab Results  Component Value Date   CHOL 134 11/07/2015   HDL 64 11/07/2015   LDLCALC 40 11/07/2015   TRIG 149 11/07/2015      Wt Readings from Last 3 Encounters:  12/09/15 179 lb 8 oz (81.4 kg)  11/07/15 178 lb 1.6 oz (80.8 kg)  05/13/15 180 lb 12 oz (82 kg)       ASSESSMENT AND PLAN:  Angina at rest [facility]) - Plan: EKG 12-Lead Currently with no significant chest pain on exertion No further workup at this time  Coronary artery disease involving native coronary artery of native heart with angina pectoris with documented spasm ([place]) - Plan: EKG 12-Lead Currently with no symptoms of angina. No further workup at this time. Continue current medication regimen.  Essential hypertension - Plan: EKG 12-Lead Blood pressure is well  controlled on today's visit. No changes made to the medications.  Encounter for immunization - Plan: Flu Vaccine QUAD 36+ mos IM Flu shot provided  PVC's (premature ventricular contractions) PVCs in a bigeminal pattern on today's visit, asymptomatic Left carotid stenosis  Weakness of lower extremity, unspecified laterality Long discussion concerning her leg weakness Recommended regular exercise program Discussed the importance of maintaining leg strength for her independence with her ADLs   Total encounter time more than 25 minutes  Greater than 50% was spent in counseling and coordination of care with the patient    Disposition:   F/U  6 months   Orders Placed This Encounter  Procedures  . Flu Vaccine QUAD 36+ mos IM  . EKG 12-Lead     Signed, Esmond Plants, M.D., Ph.D. 12/09/2015  [place], [place]

## 2015-12-10 DIAGNOSIS — H4322 Crystalline deposits in vitreous body, left eye: Secondary | ICD-10-CM | POA: Diagnosis not present

## 2015-12-10 DIAGNOSIS — H353123 Nonexudative age-related macular degeneration, left eye, advanced atrophic without subfoveal involvement: Secondary | ICD-10-CM | POA: Diagnosis not present

## 2015-12-10 DIAGNOSIS — H353212 Exudative age-related macular degeneration, right eye, with inactive choroidal neovascularization: Secondary | ICD-10-CM | POA: Diagnosis not present

## 2015-12-10 DIAGNOSIS — H43813 Vitreous degeneration, bilateral: Secondary | ICD-10-CM | POA: Diagnosis not present

## 2016-03-06 ENCOUNTER — Other Ambulatory Visit: Payer: Self-pay | Admitting: Physician Assistant

## 2016-03-06 ENCOUNTER — Other Ambulatory Visit: Payer: Self-pay | Admitting: Cardiovascular Disease

## 2016-03-18 DIAGNOSIS — Z79899 Other long term (current) drug therapy: Secondary | ICD-10-CM | POA: Diagnosis not present

## 2016-03-18 DIAGNOSIS — Z0000 Encounter for general adult medical examination without abnormal findings: Secondary | ICD-10-CM | POA: Diagnosis not present

## 2016-03-18 DIAGNOSIS — F419 Anxiety disorder, unspecified: Secondary | ICD-10-CM | POA: Diagnosis not present

## 2016-03-18 DIAGNOSIS — I209 Angina pectoris, unspecified: Secondary | ICD-10-CM | POA: Diagnosis not present

## 2016-03-18 DIAGNOSIS — Z23 Encounter for immunization: Secondary | ICD-10-CM | POA: Diagnosis not present

## 2016-03-18 DIAGNOSIS — Z1389 Encounter for screening for other disorder: Secondary | ICD-10-CM | POA: Diagnosis not present

## 2016-03-18 DIAGNOSIS — I10 Essential (primary) hypertension: Secondary | ICD-10-CM | POA: Diagnosis not present

## 2016-03-18 DIAGNOSIS — E7800 Pure hypercholesterolemia, unspecified: Secondary | ICD-10-CM | POA: Diagnosis not present

## 2016-03-18 DIAGNOSIS — F3340 Major depressive disorder, recurrent, in remission, unspecified: Secondary | ICD-10-CM | POA: Diagnosis not present

## 2016-03-31 DIAGNOSIS — H353212 Exudative age-related macular degeneration, right eye, with inactive choroidal neovascularization: Secondary | ICD-10-CM | POA: Diagnosis not present

## 2016-05-15 ENCOUNTER — Other Ambulatory Visit: Payer: Self-pay | Admitting: Cardiovascular Disease

## 2016-06-02 NOTE — Progress Notes (Signed)
I Cardiology Office Note  Date:  06/03/2016   ID:  [name], DOB [birth year], MRN [id number]  PCP:  Lajean Manes, MD   Chief Complaint  Patient presents with  . other    6 month f/u c/o sob and chest/back discomfort. Meds reviewed verbally with pt.    HPI:  83 yo woman with  CAD,  prior PCI of the LAD in 2001 with non DES, PCI of mid LCX with non des and cutting balloon,  chronic chest pain episodes,   cardiac catheterization April 2014 showing occluded RCA, patent LAD and left circumflex and  50% carotid disease on the left.  Chronic leg weakness  hospitalization for bronchitis  hospital 10/25, d/c 11/07/2015  She presents today for follow-up of her coronary artery disease, frequent PVCs, hypertension, carotid arterial disease  Walking in the house Legs weak, "same old same old" Able to go shopping but only goes for a short period of time before she is tired, has to rest She does not have a regular exercise program  Poor sleep, sleeps 12 to 4am, then will wake up,  naps during the daytime Has back pain in the bed, better sitting up, in the recliner Denies having any significant chest pain  Right flank pain one month ago, none since then, but pain was severe    Lab work reviewed today Total chol 134, LDL 55  EKG personally reviewed by myself on todays visit  shows normal sinus rhythm with rate 65 bpm no significant ST or T-wave changes  Other past medical history reviewed Previously reported having chest tightness, rare episodes that wake her at night One episode at 4 AM, low-grade chest tightness, then she resolved on its own. Did not take nitroglycerin dramatic improvement in her symptoms on isosorbide 15 mg daily.  Long history of GI issues, diarrhea, has to take extra potassium daily Most recent potassium above 3.5 at the end of December 2016  Chronic leg weakness, does her ADLs, does not work in the garden anymore, unable to get up if she is on the  ground   seen in the hospital 11/03/2013 for chest pain. Workup was negative. Stress test was offered but she declined Blood pressure was elevated in the hospital, likely exacerbated by anxiety. Losartan was increased up to 100 mg daily  Previous lab work total cholesterol 136, LDL 14, HDL 56, hemoglobin A1c 5.8, TSH 3.7 Echocardiogram 11/04/2013 was reviewed with her showing normal LV systolic function, moderately elevated right ventricular systolic pressure, otherwise normal study  echocardiogram in 2012 demonstrated well-preserved left ventricular function, and trace AI.   3 vessel PTCA at [facility] in [facility] and then had a stent placed several years ago  (non DES).     PMH:   has a past medical history of Anxiety; Atypical carcinoid lung tumor ([place]); CAD (coronary artery disease); Cancer ([place]); Carotid disease, bilateral ([place]); Cataracts, bilateral; Cholelithiases; Depression; Gilbert's syndrome; HTN (hypertension); Hyperglycemia; Hyperlipidemia; Impaired fasting blood sugar; Keloid; Macular degeneration; Osteoarthritis; Positional vertigo; Symptoms, such as flushing, sleeplessness, headache, lack of concentration, associated with the menopause; and TIA (transient ischemic attack).  PSH:    Past Surgical History:  Procedure Laterality Date  . carcinoid     removed from lung, [place]  . LEFT HEART CATHETERIZATION WITH CORONARY ANGIOGRAM N/A 04/27/2012   Procedure: LEFT HEART CATHETERIZATION WITH CORONARY ANGIOGRAM;  Surgeon: Peter M Martinique, MD;  Location: [facility] CATH LAB;  Service: Cardiovascular;  Laterality: N/A;  . pci  1986  .  pci of mid cfx     with non des and cutting balloon pci of av circumflex  . pic of lad  2001   with non des lad     Current Outpatient Prescriptions  Medication Sig Dispense Refill  . acetaminophen (TYLENOL) 325 MG tablet Take 2 tablets (650 mg total) by mouth every 6 (six) hours as needed for mild pain, fever or headache.    Marland Kitchen amLODipine (NORVASC) 10 MG tablet Take  10 mg by mouth daily.    Marland Kitchen aspirin 81 MG tablet Take 81 mg by mouth daily.    Marland Kitchen atorvastatin (LIPITOR) 10 MG tablet Take 10 mg by mouth daily.    . Calcium Carbonate-Vitamin D (CALCIUM 600 + D PO) Take 1 tablet by mouth daily. 600 mg/ 400 mg    . clopidogrel (PLAVIX) 75 MG tablet Take 75 mg by mouth daily.    Marland Kitchen estrogens, conjugated, (PREMARIN) 0.3 MG tablet Take 0.3 mg by mouth daily. Take daily for 21 days then do not take for 7 days.    . furosemide (LASIX) 20 MG tablet Take 20 mg by mouth daily.    Marland Kitchen guaiFENesin-dextromethorphan (ROBITUSSIN DM) 100-10 MG/5ML syrup Take 10 mLs by mouth every 6 (six) hours as needed for cough. 118 mL 0  . isosorbide mononitrate (IMDUR) 30 MG 24 hr tablet TAKE 0.5 TABLETS (15 MG TOTAL) BY MOUTH DAILY. 30 tablet 5  . KLOR-CON M20 20 MEQ tablet TAKE 1 TABLET (20 MEQ TOTAL) BY MOUTH 2 (TWO) TIMES DAILY. 90 tablet 3  . LORazepam (ATIVAN) 0.5 MG tablet Take 0.5 mg by mouth every 8 (eight) hours as needed for anxiety.     Marland Kitchen losartan (COZAAR) 50 MG tablet Take 50 mg by mouth daily.    . metoprolol succinate (TOPROL-XL) 50 MG 24 hr tablet TAKE 1 TABLET (50 MG TOTAL) BY MOUTH DAILY. TAKE WITH OR IMMEDIATELY FOLLOWING A MEAL. 90 tablet 2  . Multiple Vitamin (MULTIVITAMIN) tablet Take 1 tablet by mouth daily.    . nitroGLYCERIN (NITROSTAT) 0.4 MG SL tablet Place 1 tablet (0.4 mg total) under the tongue every 5 (five) minutes as needed for chest pain. 25 tablet 6  . omeprazole (PRILOSEC) 20 MG capsule Take 20 mg by mouth daily.     No current facility-administered medications for this visit.      Allergies:   Biaxin [clarithromycin]; Celecoxib; Codeine; Lisinopril; Macrobid [nitrofurantoin monohyd macro]; Oxycodone hcl; Penicillins; and Sertraline hcl   Social History:  The patient  reports that she has never smoked. She has never used smokeless tobacco. She reports that she does not drink alcohol or use drugs.   Family History:   family history includes CAD in her  father and mother; Diabetes Mellitus I in her brother, mother, sister, and sister.    Review of Systems: Review of Systems  Constitutional: Positive for malaise/fatigue.  Respiratory: Negative.   Cardiovascular: Negative.   Gastrointestinal: Negative.   Musculoskeletal: Negative.        Leg weakness  Neurological: Positive for weakness.  Psychiatric/Behavioral: Negative.   All other systems reviewed and are negative.    PHYSICAL EXAM: VS:  BP 136/62 (BP Location: Left Arm, Patient Position: Sitting, Cuff Size: Normal)   Pulse 65   Ht 5' 6.5" (1.689 m)   Wt 177 lb 8 oz (80.5 kg)   BMI 28.22 kg/m  , BMI Body mass index is 28.22 kg/m. GEN: Well nourished, well developed, in no acute distress  HEENT: normal  Neck: no JVD, 1+ carotid bruit on the left, none on the right, no masses Cardiac: RRR; no murmurs, rubs, or gallops,no edema  Respiratory:  clear to auscultation bilaterally, normal work of breathing GI: soft, nontender, nondistended, + BS MS: no deformity or atrophy  Skin: warm and dry, no rash Neuro:  Strength and sensation are intact Psych: euthymic mood, full affect    Recent Labs: 11/06/2015: BUN 13; Potassium 3.4; Sodium 138 11/07/2015: Creatinine, Ser 0.65; Hemoglobin 12.6; Platelets 155; TSH 1.472    Lipid Panel Lab Results  Component Value Date   CHOL 134 11/07/2015   HDL 64 11/07/2015   LDLCALC 40 11/07/2015   TRIG 149 11/07/2015      Wt Readings from Last 3 Encounters:  06/03/16 177 lb 8 oz (80.5 kg)  12/09/15 179 lb 8 oz (81.4 kg)  11/07/15 178 lb 1.6 oz (80.8 kg)       ASSESSMENT AND PLAN:   Angina at rest [facility]) - Plan: EKG 12-Lead Currently with no significant chest pain on exertion No further workup at this time  Coronary artery disease involving native coronary artery of native heart with angina pectoris with documented spasm ([place]) - Plan: EKG 12-Lead Currently with no symptoms of angina. No further workup at this time. Continue  current medication regimen.  Essential hypertension - Plan: EKG 12-Lead Blood pressure is well controlled on today's visit. No changes made to the medications.  PVC's (premature ventricular contractions) No PVCs on today's visit   Left carotid stenosis Bruit on the left, unchanged   Weakness of lower extremity, unspecified laterality Recommended regular exercise program Discussed the importance of maintaining leg strength    Total encounter time more than 25 minutes  Greater than 50% was spent in counseling and coordination of care with the patient   Disposition:   F/U  12 months   Orders Placed This Encounter  Procedures  . EKG 12-Lead     Signed, Esmond Plants, M.D., Ph.D. 06/03/2016  [place], [place]

## 2016-06-03 ENCOUNTER — Ambulatory Visit (INDEPENDENT_AMBULATORY_CARE_PROVIDER_SITE_OTHER): Payer: Medicare Other | Admitting: Cardiovascular Disease

## 2016-06-03 ENCOUNTER — Encounter: Payer: Self-pay | Admitting: Cardiovascular Disease

## 2016-06-03 VITALS — BP 136/62 | HR 65 | Ht 66.5 in | Wt 177.5 lb

## 2016-06-03 DIAGNOSIS — E7800 Pure hypercholesterolemia, unspecified: Secondary | ICD-10-CM | POA: Diagnosis not present

## 2016-06-03 DIAGNOSIS — I209 Angina pectoris, unspecified: Secondary | ICD-10-CM

## 2016-06-03 DIAGNOSIS — I25118 Atherosclerotic heart disease of native coronary artery with other forms of angina pectoris: Secondary | ICD-10-CM | POA: Diagnosis not present

## 2016-06-03 DIAGNOSIS — R29898 Other symptoms and signs involving the musculoskeletal system: Secondary | ICD-10-CM | POA: Diagnosis not present

## 2016-06-03 DIAGNOSIS — I493 Ventricular premature depolarization: Secondary | ICD-10-CM

## 2016-06-03 DIAGNOSIS — I6522 Occlusion and stenosis of left carotid artery: Secondary | ICD-10-CM | POA: Diagnosis not present

## 2016-06-03 DIAGNOSIS — I10 Essential (primary) hypertension: Secondary | ICD-10-CM

## 2016-06-03 NOTE — Patient Instructions (Addendum)

## 2016-06-19 ENCOUNTER — Other Ambulatory Visit: Payer: Self-pay | Admitting: Cardiovascular Disease

## 2016-07-08 ENCOUNTER — Emergency Department
Admission: EM | Admit: 2016-07-08 | Discharge: 2016-07-08 | Disposition: A | Discharge status: Home / Self Care | Payer: Medicare Other | Attending: Student in an Organized Health Care Education/Training Program | Admitting: Student in an Organized Health Care Education/Training Program

## 2016-07-08 ENCOUNTER — Encounter: Payer: Self-pay | Admitting: Emergency Medicine

## 2016-07-08 ENCOUNTER — Emergency Department: Payer: Medicare Other

## 2016-07-08 DIAGNOSIS — Z7982 Long term (current) use of aspirin: Secondary | ICD-10-CM | POA: Diagnosis not present

## 2016-07-08 DIAGNOSIS — Z8673 Personal history of transient ischemic attack (TIA), and cerebral infarction without residual deficits: Secondary | ICD-10-CM | POA: Diagnosis not present

## 2016-07-08 DIAGNOSIS — R1011 Right upper quadrant pain: Secondary | ICD-10-CM | POA: Insufficient documentation

## 2016-07-08 DIAGNOSIS — Z7902 Long term (current) use of antithrombotics/antiplatelets: Secondary | ICD-10-CM | POA: Insufficient documentation

## 2016-07-08 DIAGNOSIS — R071 Chest pain on breathing: Secondary | ICD-10-CM | POA: Diagnosis not present

## 2016-07-08 DIAGNOSIS — Z85118 Personal history of other malignant neoplasm of bronchus and lung: Secondary | ICD-10-CM | POA: Diagnosis not present

## 2016-07-08 DIAGNOSIS — I252 Old myocardial infarction: Secondary | ICD-10-CM | POA: Diagnosis not present

## 2016-07-08 DIAGNOSIS — I10 Essential (primary) hypertension: Secondary | ICD-10-CM | POA: Insufficient documentation

## 2016-07-08 DIAGNOSIS — Z79899 Other long term (current) drug therapy: Secondary | ICD-10-CM | POA: Diagnosis not present

## 2016-07-08 DIAGNOSIS — I2510 Atherosclerotic heart disease of native coronary artery without angina pectoris: Secondary | ICD-10-CM | POA: Insufficient documentation

## 2016-07-08 LAB — CBC
HCT: 39.2 % (ref 35.0–47.0)
Hemoglobin: 13.7 g/dL (ref 12.0–16.0)
MCH: 30.9 pg (ref 26.0–34.0)
MCHC: 35.1 g/dL (ref 32.0–36.0)
MCV: 87.9 fL (ref 80.0–100.0)
PLATELETS: 169 10*3/uL (ref 150–440)
RBC: 4.45 MIL/uL (ref 3.80–5.20)
RDW: 12.8 % (ref 11.5–14.5)
WBC: 5.6 10*3/uL (ref 3.6–11.0)

## 2016-07-08 LAB — COMPREHENSIVE METABOLIC PANEL
ALT: 21 U/L (ref 14–54)
AST: 35 U/L (ref 15–41)
Albumin: 4.3 g/dL (ref 3.5–5.0)
Alkaline Phosphatase: 82 U/L (ref 38–126)
Anion gap: 6 (ref 5–15)
BUN: 13 mg/dL (ref 6–20)
CHLORIDE: 99 mmol/L — AB (ref 101–111)
CO2: 27 mmol/L (ref 22–32)
CREATININE: 0.83 mg/dL (ref 0.44–1.00)
Calcium: 9 mg/dL (ref 8.9–10.3)
GFR calc Af Amer: >60 mL/min (ref >60)
Glucose, Bld: 140 mg/dL — ABNORMAL HIGH (ref 65–99)
Potassium: 3.3 mmol/L — ABNORMAL LOW (ref 3.5–5.1)
SODIUM: 132 mmol/L — AB (ref 135–145)
Total Bilirubin: 1.6 mg/dL — ABNORMAL HIGH (ref 0.3–1.2)
Total Protein: 7.3 g/dL (ref 6.5–8.1)

## 2016-07-08 LAB — LIPASE, BLOOD: LIPASE: 34 U/L (ref 11–51)

## 2016-07-08 LAB — TROPONIN I
Troponin I: <0.03 ng/mL (ref <0.03)
Troponin I: <0.03 ng/mL (ref <0.03)

## 2016-07-08 LAB — FIBRIN DERIVATIVES D-DIMER (ARMC ONLY): Fibrin derivatives D-dimer (ARMC): 902.96 — ABNORMAL HIGH (ref 0.00–499.00)

## 2016-07-08 MED ORDER — AMLODIPINE BESYLATE 5 MG PO TABS
10.0000 mg | ORAL_TABLET | Freq: Once | ORAL | Status: AC
Start: 2016-07-08 — End: 2016-07-08
  Administered 2016-07-08: 10 mg via ORAL
  Filled 2016-07-08: qty 2

## 2016-07-08 MED ORDER — RANITIDINE HCL 150 MG PO TABS: 150.0000 mg | ORAL_TABLET | Freq: Two times a day (BID) | ORAL | 0 refills | Status: DC

## 2016-07-08 MED ORDER — IOPAMIDOL (ISOVUE-370) INJECTION 76%
100.0000 mL | Freq: Once | INTRAVENOUS | Status: AC | PRN
Start: 2016-07-08 — End: 2016-07-08
  Administered 2016-07-08: 100 mL via INTRAVENOUS

## 2016-07-08 MED ORDER — SODIUM CHLORIDE 0.9 % IV BOLUS (SEPSIS)
500.0000 mL | Freq: Once | INTRAVENOUS | Status: AC
  Administered 2016-07-08: 500 mL via INTRAVENOUS

## 2016-07-08 NOTE — ED Notes (Signed)
Resumed care from iris rn.  Pt alert. Family with pt.  Sinus on monitor.

## 2016-07-08 NOTE — ED Triage Notes (Signed)
Patient presents to the ED with right upper quadrant abdominal pain via EMS from home.  Patient states pain began approx. 45 min prior to arrival and was very severe.  Patient reports a similar episode approx. 1 week ago.  Patient reports nausea but denies vomiting and diarrhea.  Patient states she took a sl nitro at home and EMS gave patient 4mg  of zofran.  Patient states pain has now subsided.  No obvious distress at this time.

## 2016-07-08 NOTE — Discharge Instructions (Signed)

## 2016-07-08 NOTE — ED Provider Notes (Signed)
[facility] Emergency Department Provider Note    First MD Initiated Contact with Patient 07/08/16 1103     (approximate)  I have reviewed the triage vital signs and the nursing notes.   HISTORY  Chief Complaint Abdominal Pain    HPI [name] is a 83 y.o. female with a history of CAD as well as lung cancer status post right lower lobectomy presents with right upper quadrant abdominal pain that has been on and off over the past 2 weeks. Episode occurred right around 9:00 this morning while the patient was coming her hair. States it lasted roughly 30 minutes. It started in the right upper quadrant area and then radiated across her epigastric region. There was associated shortness of breath. No been sternal chest pain. No radiation of pain to her shoulder or jaw. No diaphoresis. States that she did feel nauseated but no vomiting or diarrhea. She is status post cholecystectomy. No fevers. No dysuria.   Past Medical History:  Diagnosis Date  . Anxiety   . Atypical carcinoid lung tumor ([place])   . CAD (coronary artery disease)    a. 1986: s/p PTCA of bifurcation lesion in LAD;  b. 2001: s/p BMS to LAD & LCX;  c. 04/2006 Cath: diff CAD w/o critical narrowing, nl EF;  d. 06/2010 Echo: EF 60-65%, mild AI/MR.  . Cancer ([place])   . Carotid disease, bilateral ([place])    a. RICA 4-09%, LICA 73-53%  . Cataracts, bilateral   . Cholelithiases   . Depression   . Gilbert's syndrome   . HTN (hypertension)   . Hyperglycemia    mild  . Hyperlipidemia   . Impaired fasting blood sugar   . Keloid    mid sternum  . Macular degeneration   . Osteoarthritis   . Positional vertigo    benign  . Symptoms, such as flushing, sleeplessness, headache, lack of concentration, associated with the menopause   . TIA (transient ischemic attack)    Family History  Problem Relation Age of Onset  . Diabetes Mellitus I Mother   . CAD Mother   . CAD Father   . Diabetes Mellitus I Sister    . Diabetes Mellitus I Brother   . Diabetes Mellitus I Sister    Past Surgical History:  Procedure Laterality Date  . carcinoid     removed from lung, [place]  . LEFT HEART CATHETERIZATION WITH CORONARY ANGIOGRAM N/A 04/27/2012   Procedure: LEFT HEART CATHETERIZATION WITH CORONARY ANGIOGRAM;  Surgeon: Peter M Martinique, MD;  Location: [facility] CATH LAB;  Service: Cardiovascular;  Laterality: N/A;  . pci  1986  . pci of mid cfx     with non des and cutting balloon pci of av circumflex  . pic of lad  2001   with non des lad    Patient Active Problem List   Diagnosis Date Noted  . Weakness of lower extremity 12/09/2015  . Viral URI 11/07/2015  . Hypokalemia 11/07/2015  . Cough   . Diaphoresis   . Sore throat   . Atypical chest pain 11/06/2015  . NSTEMI (non-ST elevated myocardial infarction) ([place]) 12/29/2014  . Chest pain 12/28/2014  . Angina pectoris ([place])   . Coronary artery disease involving native coronary artery of native heart with angina pectoris with documented spasm ([place])   . Emesis   . Gastroenteritis, acute   . PVC's (premature ventricular contractions) 04/25/2012  . Cardiac arrest ([place])   . Left carotid stenosis 03/01/2012  .  Leg fatigue 03/01/2012  . Dysuria 03/30/2011  . Mitral regurgitation 03/30/2011  . HYPERCHOLESTEROLEMIA  IIA 08/06/2008  . Carotid bruit 05/28/2008  . Essential hypertension 05/11/2008  . Coronary atherosclerosis 05/11/2008  . Angina at rest [facility]) 05/11/2008      Prior to Admission medications   Medication Sig Start Date End Date Taking? Authorizing Provider  acetaminophen (TYLENOL) 325 MG tablet Take 2 tablets (650 mg total) by mouth every 6 (six) hours as needed for mild pain, fever or headache. 11/07/15   Gouru, Illene Silver, MD  amLODipine (NORVASC) 10 MG tablet Take 10 mg by mouth daily.    [provider]  aspirin 81 MG tablet Take 81 mg by mouth daily.    [provider]  atorvastatin (LIPITOR) 10 MG tablet Take 10 mg by mouth  daily.    [provider]  Calcium Carbonate-Vitamin D (CALCIUM 600 + D PO) Take 1 tablet by mouth daily. 600 mg/ 400 mg    [provider]  clopidogrel (PLAVIX) 75 MG tablet Take 75 mg by mouth daily.    [provider]  estrogens, conjugated, (PREMARIN) 0.3 MG tablet Take 0.3 mg by mouth daily. Take daily for 21 days then do not take for 7 days.    [provider]  furosemide (LASIX) 20 MG tablet Take 20 mg by mouth daily.    [provider]  guaiFENesin-dextromethorphan (ROBITUSSIN DM) 100-10 MG/5ML syrup Take 10 mLs by mouth every 6 (six) hours as needed for cough. 11/07/15   Nicholes Mango, MD  isosorbide mononitrate (IMDUR) 30 MG 24 hr tablet TAKE 0.5 TABLETS (15 MG TOTAL) BY MOUTH DAILY. 03/06/16   Dunn, Ryan M, PA-C  KLOR-CON M20 20 MEQ tablet TAKE 1 TABLET (20 MEQ TOTAL) BY MOUTH 2 (TWO) TIMES DAILY. 03/06/16   Minna Merritts, MD  LORazepam (ATIVAN) 0.5 MG tablet Take 0.5 mg by mouth every 8 (eight) hours as needed for anxiety.     [provider]  losartan (COZAAR) 50 MG tablet Take 50 mg by mouth daily.    [provider]  metoprolol succinate (TOPROL-XL) 50 MG 24 hr tablet TAKE 1 TABLET (50 MG TOTAL) BY MOUTH DAILY. TAKE WITH OR IMMEDIATELY FOLLOWING A MEAL. 05/15/16   Minna Merritts, MD  Multiple Vitamin (MULTIVITAMIN) tablet Take 1 tablet by mouth daily.    [provider]  nitroGLYCERIN (NITROSTAT) 0.4 MG SL tablet PLACE 1 TABLET (0.4 MG TOTAL) UNDER THE TONGUE EVERY 5 (FIVE) MINUTES AS NEEDED FOR CHEST PAIN. 06/19/16   Minna Merritts, MD  omeprazole (PRILOSEC) 20 MG capsule Take 20 mg by mouth daily.    [provider]    Allergies Biaxin [clarithromycin]; Celecoxib; Codeine; Lisinopril; Macrobid [nitrofurantoin monohyd macro]; Oxycodone hcl; Penicillins; and Sertraline hcl    Social History Social History  Substance Use Topics  . Smoking status: Never Smoker  . Smokeless tobacco: Never Used  .  Alcohol use No    Review of Systems Patient denies headaches, rhinorrhea, blurry vision, numbness, shortness of breath, chest pain, edema, cough, abdominal pain, nausea, vomiting, diarrhea, dysuria, fevers, rashes or hallucinations unless otherwise stated above in HPI. ____________________________________________   PHYSICAL EXAM:  VITAL SIGNS: Vitals:   07/08/16 1053  BP: (!) 187/61  Pulse: 67  Resp: 18  Temp: 97.7 F (36.5 C)    Constitutional: Alert and oriented. Well appearing and in no acute distress. Eyes: Conjunctivae are normal.  Head: Atraumatic. Nose: No congestion/rhinnorhea. Mouth/Throat: Mucous membranes are moist.  Neck: No stridor. Painless ROM.  Cardiovascular: Normal rate, regular rhythm. Grossly normal heart sounds.  Good peripheral circulation. Respiratory: Normal respiratory effort.  No retractions. Lungs CTAB. Gastrointestinal: Soft and nontender. No distention. No abdominal bruits. No CVA tenderness. Musculoskeletal: No lower extremity tenderness nor edema.  No joint effusions. Neurologic:  Normal speech and language. No gross focal neurologic deficits are appreciated. No facial droop Skin:  Skin is warm, dry and intact. No rash noted. Psychiatric: Mood and affect are normal. Speech and behavior are normal.  ____________________________________________   LABS (all labs ordered are listed, but only abnormal results are displayed)  Results for orders placed or performed during the hospital encounter of 07/08/16 (from the past 24 hour(s))  CBC     Status: None   Collection Time: 07/08/16 10:54 AM  Result Value Ref Range   WBC 5.6 3.6 - 11.0 K/uL   RBC 4.45 3.80 - 5.20 MIL/uL   Hemoglobin 13.7 12.0 - 16.0 g/dL   HCT 39.2 35.0 - 47.0 %   MCV 87.9 80.0 - 100.0 fL   MCH 30.9 26.0 - 34.0 pg   MCHC 35.1 32.0 - 36.0 g/dL   RDW 12.8 11.5 - 14.5 %   Platelets 169 150 - 440 K/uL   ____________________________________________  EKG My review and personal  interpretation at Time: 14:02   Indication: ruq pain  Rate: 60  Rhythm: sinus Axis: normal Other: normal intervals, non specific st changes that are not significantly changed from previous EKG 5/18.  Occasional PVC ____________________________________________  RADIOLOGY  I personally reviewed all radiographic images ordered to evaluate for the above acute complaints and reviewed radiology reports and findings.  These findings were personally discussed with the patient.  Please see medical record for radiology report.  ____________________________________________   PROCEDURES  Procedure(s) performed:  Procedures    Critical Care performed: no ____________________________________________   INITIAL IMPRESSION / ASSESSMENT AND PLAN / ED COURSE  Pertinent labs & imaging results that were available during my care of the patient were reviewed by me and considered in my medical decision making (see chart for details).  DDX: hepatitis, pna, colitis, diverticulitis, constipation, angina  CHARIAH BAILEY is a 83 y.o. who presents to the ED with right upper quadrant pain as described above. Patient arrives well appearing and in no acute distress right now. She is pain-free. Patient multiple comorbidities therefore laboratory evaluation will be sent for above differential. D-dimer will be sent to further risk stratify for pulmonary embolism seems less likely clinically.  The patient will be placed on continuous pulse oximetry and telemetry for monitoring.  Laboratory evaluation will be sent to evaluate for the above complaints.     Clinical Course as of Jul 08 1828  Wed Jul 08, 2016  1524 Patient reassessed. Remains asymptomatic for the past 4-1/2 hours. Blood pressure is controlled. I did recommend admission to the hospital for further risk stratification and concern that this could be a new anginal symptom.  Patient does not want to be admitted that she does not want any stress testing performed  and will prefer to follow up with Dr. Candis Musa as an outpatient. She remains hemodynamic stable do feel that is a reasonable option.  Patient was able to tolerate PO and was able to ambulate with a steady gait.  Have discussed with the patient and available family all diagnostics and treatments performed thus far and all questions were answered to the best of my ability. The patient demonstrates understanding and agreement with  plan.   [PR]    Clinical Course User Index [PR] Merlyn Lot, MD     ____________________________________________   FINAL CLINICAL IMPRESSION(S) / ED DIAGNOSES  Final diagnoses:  RUQ discomfort      NEW MEDICATIONS STARTED DURING THIS VISIT:  New Prescriptions   No medications on file     Note:  This document was prepared using Dragon voice recognition software and may include unintentional dictation errors.    Merlyn Lot, MD 07/08/16 [phone]

## 2016-07-09 DIAGNOSIS — I209 Angina pectoris, unspecified: Secondary | ICD-10-CM | POA: Diagnosis not present

## 2016-07-09 DIAGNOSIS — I10 Essential (primary) hypertension: Secondary | ICD-10-CM | POA: Diagnosis not present

## 2016-07-09 DIAGNOSIS — I2510 Atherosclerotic heart disease of native coronary artery without angina pectoris: Secondary | ICD-10-CM | POA: Diagnosis not present

## 2016-07-09 DIAGNOSIS — F3340 Major depressive disorder, recurrent, in remission, unspecified: Secondary | ICD-10-CM | POA: Diagnosis not present

## 2016-07-10 ENCOUNTER — Telehealth: Payer: Self-pay | Admitting: Cardiovascular Disease

## 2016-07-10 NOTE — Telephone Encounter (Signed)
Spoke to patient about scheduling ED fu seen on 07/08/16 She states she did not feel like she needed an appointment  Thinks it was more with her stomach   Will call us back if she needed [place] Nothing further needed.

## 2016-07-31 DIAGNOSIS — I209 Angina pectoris, unspecified: Secondary | ICD-10-CM | POA: Diagnosis not present

## 2016-07-31 DIAGNOSIS — I2510 Atherosclerotic heart disease of native coronary artery without angina pectoris: Secondary | ICD-10-CM | POA: Diagnosis not present

## 2016-07-31 DIAGNOSIS — F3340 Major depressive disorder, recurrent, in remission, unspecified: Secondary | ICD-10-CM | POA: Diagnosis not present

## 2016-07-31 DIAGNOSIS — I10 Essential (primary) hypertension: Secondary | ICD-10-CM | POA: Diagnosis not present

## 2016-08-18 DIAGNOSIS — H353212 Exudative age-related macular degeneration, right eye, with inactive choroidal neovascularization: Secondary | ICD-10-CM | POA: Diagnosis not present

## 2016-08-25 ENCOUNTER — Other Ambulatory Visit: Payer: Self-pay | Admitting: Cardiovascular Disease

## 2016-08-25 NOTE — Telephone Encounter (Signed)
Refill Request, please advise if ok to refill.

## 2016-08-25 NOTE — Telephone Encounter (Signed)
Ok to refill?  I'm getting all kinds of red flags about duplicate medications and interactions.

## 2016-09-01 ENCOUNTER — Other Ambulatory Visit: Payer: Self-pay

## 2016-09-01 MED ORDER — LOSARTAN POTASSIUM 50 MG PO TABS: 50.0000 mg | ORAL_TABLET | Freq: Every day | ORAL | 3 refills | Status: DC

## 2016-09-01 NOTE — Telephone Encounter (Signed)
Please advise if ok to refill. 

## 2016-09-07 ENCOUNTER — Other Ambulatory Visit: Payer: Self-pay

## 2016-09-07 ENCOUNTER — Telehealth: Payer: Self-pay | Admitting: Cardiovascular Disease

## 2016-09-07 MED ORDER — LOSARTAN POTASSIUM 50 MG PO TABS: 50.0000 mg | ORAL_TABLET | Freq: Every day | ORAL | 0 refills | Status: DC

## 2016-09-07 NOTE — Telephone Encounter (Signed)
Patient states she is to take Losartan 100MG  daily but Losartan 50MG  was sent in. I do not see where Dr. Rockey Situ decreased to 50MG 

## 2016-09-07 NOTE — Telephone Encounter (Signed)
Requested Prescriptions   Signed Prescriptions Disp Refills  . losartan (COZAAR) 50 MG tablet 90 tablet 0    Sig: Take 1 tablet (50 mg total) by mouth daily.    Authorizing Provider: Minna Merritts    Ordering User: Janan Ridge

## 2016-09-07 NOTE — Telephone Encounter (Signed)
Pt has been on losartan 50 mg since hospitalization Oct 2017.

## 2016-09-07 NOTE — Telephone Encounter (Signed)
°*  STAT* If patient is at the pharmacy, call can be transferred to refill team.   1. Which medications need to be refilled? (please list name of each medication and dose if known)     Losartan 100 mg po daily    2. Which pharmacy/location (including street and city if local pharmacy) is medication to be sent to? [facility] [address] [place]   3. Do they need a 30 day or 90 day supply? 90   PATIENT SAYS SHE WAS TAKING 100 MG PREVIOUSLY BUT REFILL WAS SENT IN FOR 50 . SHE IS OUT OF MEDICATION

## 2016-09-08 ENCOUNTER — Other Ambulatory Visit: Payer: Self-pay | Admitting: Cardiovascular Disease

## 2016-09-08 NOTE — Telephone Encounter (Signed)
Spoke with patient and she reports that she has been taking losartan 100 mg for some time now. I reviewed back to 05/13/15 and at that time she was on losartan 100 mg once daily. On admission to hospital 11/07/15 according to H&P it was noted she was taking losartan 50 mg once daily by historical provider. Patient states that she has been on 100 mg of losartan for as long as she remembers and that even now she is currently taking 2 of the 50 mg tablets. Let her know that I would review with Dr. Rockey Situ tomorrow and then contact her back to see if we can clarify this and notify pharmacy as well. She was appreciative for the call and had no further questions or concerns at this time.

## 2016-09-08 NOTE — Telephone Encounter (Signed)
Patient wants this dosage clarified .  Please call.

## 2016-09-10 MED ORDER — LOSARTAN POTASSIUM 100 MG PO TABS: 100.0000 mg | ORAL_TABLET | Freq: Every day | ORAL | 3 refills | Status: DC

## 2016-09-10 NOTE — Telephone Encounter (Signed)
Okay to put on 100 mg daily Would monitor blood pressure for [place] to make sure does not low

## 2016-09-10 NOTE — Telephone Encounter (Signed)
Spoke with patient and let her know that I sent in prescription for Losartan 100 mg once daily. Reviewed that we would like her to continue monitoring her blood pressures and give [place] a call if it should get too low. She verbalized understanding of our conversation, agreement with plan, and had no further questions at this time.

## 2016-09-10 NOTE — Addendum Note (Signed)
Addended by: Valora Corporal on: 09/10/2016 07:59 AM   Modules accepted: Orders

## 2016-09-22 DIAGNOSIS — R5383 Other fatigue: Secondary | ICD-10-CM | POA: Diagnosis not present

## 2016-09-22 DIAGNOSIS — Z23 Encounter for immunization: Secondary | ICD-10-CM | POA: Diagnosis not present

## 2016-09-22 DIAGNOSIS — I10 Essential (primary) hypertension: Secondary | ICD-10-CM | POA: Diagnosis not present

## 2016-09-22 DIAGNOSIS — Z79899 Other long term (current) drug therapy: Secondary | ICD-10-CM | POA: Diagnosis not present

## 2016-09-22 DIAGNOSIS — K219 Gastro-esophageal reflux disease without esophagitis: Secondary | ICD-10-CM | POA: Diagnosis not present

## 2016-09-22 DIAGNOSIS — I351 Nonrheumatic aortic (valve) insufficiency: Secondary | ICD-10-CM | POA: Diagnosis not present

## 2016-10-05 ENCOUNTER — Other Ambulatory Visit: Payer: Self-pay | Admitting: Cardiovascular Disease

## 2016-10-13 DIAGNOSIS — I209 Angina pectoris, unspecified: Secondary | ICD-10-CM | POA: Diagnosis not present

## 2016-10-13 DIAGNOSIS — I10 Essential (primary) hypertension: Secondary | ICD-10-CM | POA: Diagnosis not present

## 2016-10-13 DIAGNOSIS — I2510 Atherosclerotic heart disease of native coronary artery without angina pectoris: Secondary | ICD-10-CM | POA: Diagnosis not present

## 2016-10-13 DIAGNOSIS — F3340 Major depressive disorder, recurrent, in remission, unspecified: Secondary | ICD-10-CM | POA: Diagnosis not present

## 2016-10-23 ENCOUNTER — Other Ambulatory Visit: Payer: Self-pay | Admitting: Cardiovascular Disease

## 2016-10-26 ENCOUNTER — Other Ambulatory Visit: Payer: Self-pay | Admitting: Cardiovascular Disease

## 2016-11-16 ENCOUNTER — Other Ambulatory Visit: Payer: Self-pay | Admitting: Cardiovascular Disease

## 2016-12-10 ENCOUNTER — Other Ambulatory Visit: Payer: Self-pay | Admitting: Cardiovascular Disease

## 2016-12-12 DIAGNOSIS — I4891 Unspecified atrial fibrillation: Secondary | ICD-10-CM

## 2016-12-12 HISTORY — DX: Unspecified atrial fibrillation: I48.91

## 2016-12-18 ENCOUNTER — Telehealth: Payer: Self-pay | Admitting: Cardiovascular Disease

## 2016-12-18 ENCOUNTER — Other Ambulatory Visit: Payer: Self-pay | Admitting: Physician Assistant

## 2016-12-18 NOTE — Telephone Encounter (Signed)
Pt c/o of Chest Pain: STAT if CP now or developed within 24 hours  1. Are you having CP right now? No   2. Are you experiencing any other symptoms (ex. SOB, nausea, vomiting, sweating)? Back pain and warm   3. How long have you been experiencing CP? 1 week   4. Is your CP continuous or coming and going? Yes   5. Have you taken Nitroglycerin? Yes it eases the pain  ?

## 2016-12-18 NOTE — Telephone Encounter (Signed)
S/w patient. States she's been having chest pain for 1 week. Worse at night. Chest pain occurs sometimes during the day with exertion and accompanied shortness of breath such as when loading laundry or walking to the mailbox.  At night she goes to sleep fine, then awakes to the chest pain then has trouble going back to sleep.  When she has the chest pain, she takes a Nitro x1 then it goes away. She says she feels weak at times.  Patient scheduled to see Ignacia Bayley, NP on 12/23/16 at 2:30.  However, advised patient to go to ED if chest pain or worsening symptoms return before upcoming appointment date and time. Pt verbalized understanding to call 911 or go to the emergency room, if he develops any new or worsening symptoms.

## 2016-12-21 ENCOUNTER — Inpatient Hospital Stay
Admission: EM | Admit: 2016-12-21 | Discharge: 2016-12-22 | DRG: 271 | Disposition: A | Discharge status: Other Acute Inpatient Hospital | Payer: Medicare Other | Attending: Internal Medicine | Admitting: Internal Medicine

## 2016-12-21 ENCOUNTER — Emergency Department: Payer: Medicare Other

## 2016-12-21 DIAGNOSIS — T82855A Stenosis of coronary artery stent, initial encounter: Secondary | ICD-10-CM | POA: Diagnosis present

## 2016-12-21 DIAGNOSIS — I10 Essential (primary) hypertension: Secondary | ICD-10-CM | POA: Diagnosis present

## 2016-12-21 DIAGNOSIS — H269 Unspecified cataract: Secondary | ICD-10-CM | POA: Diagnosis present

## 2016-12-21 DIAGNOSIS — Z833 Family history of diabetes mellitus: Secondary | ICD-10-CM

## 2016-12-21 DIAGNOSIS — R1013 Epigastric pain: Secondary | ICD-10-CM

## 2016-12-21 DIAGNOSIS — Y848 Other medical procedures as the cause of abnormal reaction of the patient, or of later complication, without mention of misadventure at the time of the procedure: Secondary | ICD-10-CM | POA: Diagnosis present

## 2016-12-21 DIAGNOSIS — H3530 Unspecified macular degeneration: Secondary | ICD-10-CM | POA: Diagnosis present

## 2016-12-21 DIAGNOSIS — F419 Anxiety disorder, unspecified: Secondary | ICD-10-CM | POA: Diagnosis present

## 2016-12-21 DIAGNOSIS — I214 Non-ST elevation (NSTEMI) myocardial infarction: Secondary | ICD-10-CM | POA: Diagnosis present

## 2016-12-21 DIAGNOSIS — R079 Chest pain, unspecified: Secondary | ICD-10-CM | POA: Diagnosis present

## 2016-12-21 DIAGNOSIS — K529 Noninfective gastroenteritis and colitis, unspecified: Secondary | ICD-10-CM | POA: Diagnosis present

## 2016-12-21 DIAGNOSIS — E785 Hyperlipidemia, unspecified: Secondary | ICD-10-CM | POA: Diagnosis present

## 2016-12-21 DIAGNOSIS — I493 Ventricular premature depolarization: Secondary | ICD-10-CM | POA: Diagnosis present

## 2016-12-21 DIAGNOSIS — Z9889 Other specified postprocedural states: Secondary | ICD-10-CM

## 2016-12-21 DIAGNOSIS — Z880 Allergy status to penicillin: Secondary | ICD-10-CM

## 2016-12-21 DIAGNOSIS — R935 Abnormal findings on diagnostic imaging of other abdominal regions, including retroperitoneum: Secondary | ICD-10-CM | POA: Diagnosis not present

## 2016-12-21 DIAGNOSIS — R109 Unspecified abdominal pain: Secondary | ICD-10-CM | POA: Diagnosis not present

## 2016-12-21 DIAGNOSIS — Z9861 Coronary angioplasty status: Secondary | ICD-10-CM

## 2016-12-21 DIAGNOSIS — K219 Gastro-esophageal reflux disease without esophagitis: Secondary | ICD-10-CM | POA: Diagnosis present

## 2016-12-21 DIAGNOSIS — Z888 Allergy status to other drugs, medicaments and biological substances status: Secondary | ICD-10-CM

## 2016-12-21 DIAGNOSIS — F329 Major depressive disorder, single episode, unspecified: Secondary | ICD-10-CM | POA: Diagnosis present

## 2016-12-21 DIAGNOSIS — Z79899 Other long term (current) drug therapy: Secondary | ICD-10-CM | POA: Diagnosis not present

## 2016-12-21 DIAGNOSIS — Z881 Allergy status to other antibiotic agents status: Secondary | ICD-10-CM

## 2016-12-21 DIAGNOSIS — Z885 Allergy status to narcotic agent status: Secondary | ICD-10-CM

## 2016-12-21 DIAGNOSIS — Z9049 Acquired absence of other specified parts of digestive tract: Secondary | ICD-10-CM

## 2016-12-21 DIAGNOSIS — I700 Atherosclerosis of aorta: Secondary | ICD-10-CM | POA: Diagnosis present

## 2016-12-21 DIAGNOSIS — I2510 Atherosclerotic heart disease of native coronary artery without angina pectoris: Secondary | ICD-10-CM | POA: Diagnosis present

## 2016-12-21 DIAGNOSIS — D3A090 Benign carcinoid tumor of the bronchus and lung: Secondary | ICD-10-CM | POA: Diagnosis present

## 2016-12-21 DIAGNOSIS — Z66 Do not resuscitate: Secondary | ICD-10-CM | POA: Diagnosis present

## 2016-12-21 DIAGNOSIS — Z7902 Long term (current) use of antithrombotics/antiplatelets: Secondary | ICD-10-CM

## 2016-12-21 DIAGNOSIS — Z7982 Long term (current) use of aspirin: Secondary | ICD-10-CM

## 2016-12-21 DIAGNOSIS — Z8249 Family history of ischemic heart disease and other diseases of the circulatory system: Secondary | ICD-10-CM

## 2016-12-21 DIAGNOSIS — I6523 Occlusion and stenosis of bilateral carotid arteries: Secondary | ICD-10-CM | POA: Diagnosis present

## 2016-12-21 DIAGNOSIS — Z886 Allergy status to analgesic agent status: Secondary | ICD-10-CM

## 2016-12-21 DIAGNOSIS — R7301 Impaired fasting glucose: Secondary | ICD-10-CM | POA: Diagnosis present

## 2016-12-21 DIAGNOSIS — Z8673 Personal history of transient ischemic attack (TIA), and cerebral infarction without residual deficits: Secondary | ICD-10-CM

## 2016-12-21 HISTORY — DX: Noninfective gastroenteritis and colitis, unspecified: K52.9

## 2016-12-21 HISTORY — DX: Personal history of other medical treatment: Z92.89

## 2016-12-21 LAB — TROPONIN I: TROPONIN I: 0.04 ng/mL — AB (ref <0.03)

## 2016-12-21 LAB — URINALYSIS, COMPLETE (UACMP) WITH MICROSCOPIC
BILIRUBIN URINE: NEGATIVE
Bacteria, UA: NONE SEEN
GLUCOSE, UA: NEGATIVE mg/dL
Hgb urine dipstick: NEGATIVE
KETONES UR: NEGATIVE mg/dL
LEUKOCYTES UA: NEGATIVE
Nitrite: NEGATIVE
PH: 6 (ref 5.0–8.0)
PROTEIN: NEGATIVE mg/dL
Specific Gravity, Urine: 1.01 (ref 1.005–1.030)

## 2016-12-21 LAB — CBC WITH DIFFERENTIAL/PLATELET
BASOS ABS: 0 10*3/uL (ref 0–0.1)
BASOS PCT: 1 %
Eosinophils Absolute: 0.1 10*3/uL (ref 0–0.7)
Eosinophils Relative: 2 %
HEMATOCRIT: 38.6 % (ref 35.0–47.0)
HEMOGLOBIN: 12.9 g/dL (ref 12.0–16.0)
LYMPHS PCT: 14 %
Lymphs Abs: 0.9 10*3/uL — ABNORMAL LOW (ref 1.0–3.6)
MCH: 29.3 pg (ref 26.0–34.0)
MCHC: 33.5 g/dL (ref 32.0–36.0)
MCV: 87.7 fL (ref 80.0–100.0)
MONO ABS: 0.5 10*3/uL (ref 0.2–0.9)
Monocytes Relative: 8 %
NEUTROS ABS: 4.9 10*3/uL (ref 1.4–6.5)
NEUTROS PCT: 75 %
Platelets: 178 10*3/uL (ref 150–440)
RBC: 4.4 MIL/uL (ref 3.80–5.20)
RDW: 13.8 % (ref 11.5–14.5)
WBC: 6.5 10*3/uL (ref 3.6–11.0)

## 2016-12-21 LAB — COMPREHENSIVE METABOLIC PANEL
ALBUMIN: 3.7 g/dL (ref 3.5–5.0)
ALK PHOS: 104 U/L (ref 38–126)
ALT: 17 U/L (ref 14–54)
AST: 29 U/L (ref 15–41)
Anion gap: 7 (ref 5–15)
BILIRUBIN TOTAL: 1.6 mg/dL — AB (ref 0.3–1.2)
BUN: 13 mg/dL (ref 6–20)
CO2: 24 mmol/L (ref 22–32)
Calcium: 9 mg/dL (ref 8.9–10.3)
Chloride: 106 mmol/L (ref 101–111)
Creatinine, Ser: 0.85 mg/dL (ref 0.44–1.00)
GFR calc Af Amer: >60 mL/min (ref >60)
GLUCOSE: 147 mg/dL — AB (ref 65–99)
POTASSIUM: 4 mmol/L (ref 3.5–5.1)
Sodium: 137 mmol/L (ref 135–145)
Total Protein: 6.7 g/dL (ref 6.5–8.1)

## 2016-12-21 LAB — LIPASE, BLOOD: LIPASE: 30 U/L (ref 11–51)

## 2016-12-21 MED ORDER — METOPROLOL SUCCINATE ER 50 MG PO TB24: 50.0000 mg | ORAL_TABLET | Freq: Every day | ORAL | Status: DC

## 2016-12-21 MED ORDER — POLYETHYLENE GLYCOL 3350 17 G PO PACK: 17.0000 g | PACK | Freq: Every day | ORAL | Status: DC | PRN

## 2016-12-21 MED ORDER — SODIUM CHLORIDE 0.9% FLUSH
3.0000 mL | Freq: Two times a day (BID) | INTRAVENOUS | Status: DC
  Administered 2016-12-22: 3 mL via INTRAVENOUS

## 2016-12-21 MED ORDER — PROMETHAZINE HCL 25 MG/ML IJ SOLN
12.5000 mg | Freq: Four times a day (QID) | INTRAMUSCULAR | Status: DC | PRN
  Administered 2016-12-21: 12.5 mg via INTRAVENOUS
  Filled 2016-12-21: qty 1

## 2016-12-21 MED ORDER — NITROGLYCERIN 2 % TD OINT
0.5000 [in_us] | TOPICAL_OINTMENT | Freq: Once | TRANSDERMAL | Status: AC
Start: 2016-12-21 — End: 2016-12-21
  Administered 2016-12-21: 0.5 [in_us] via TOPICAL
  Filled 2016-12-21: qty 1

## 2016-12-21 MED ORDER — ONDANSETRON HCL 4 MG/2ML IJ SOLN: 4.0000 mg | Freq: Four times a day (QID) | INTRAMUSCULAR | Status: DC | PRN

## 2016-12-21 MED ORDER — NITROGLYCERIN 0.4 MG SL SUBL
0.4000 mg | SUBLINGUAL_TABLET | SUBLINGUAL | Status: DC | PRN
  Administered 2016-12-22 (×2): 0.4 mg via SUBLINGUAL
  Filled 2016-12-21: qty 1

## 2016-12-21 MED ORDER — SODIUM CHLORIDE 0.9 % IV SOLN: 250.0000 mL | INTRAVENOUS | Status: DC | PRN

## 2016-12-21 MED ORDER — TEMAZEPAM 7.5 MG PO CAPS: 15.0000 mg | ORAL_CAPSULE | Freq: Every evening | ORAL | Status: DC | PRN

## 2016-12-21 MED ORDER — ONDANSETRON HCL 4 MG PO TABS: 4.0000 mg | ORAL_TABLET | Freq: Four times a day (QID) | ORAL | Status: DC | PRN

## 2016-12-21 MED ORDER — ATORVASTATIN CALCIUM 10 MG PO TABS: 10.0000 mg | ORAL_TABLET | Freq: Every day | ORAL | Status: DC

## 2016-12-21 MED ORDER — SODIUM CHLORIDE 0.9% FLUSH: 3.0000 mL | INTRAVENOUS | Status: DC | PRN

## 2016-12-21 MED ORDER — MORPHINE SULFATE (PF) 2 MG/ML IV SOLN: 2.0000 mg | INTRAVENOUS | Status: DC | PRN

## 2016-12-21 MED ORDER — ASPIRIN 81 MG PO CHEW
81.0000 mg | CHEWABLE_TABLET | Freq: Every day | ORAL | Status: DC
  Administered 2016-12-22: 81 mg via ORAL

## 2016-12-21 MED ORDER — LOSARTAN POTASSIUM 50 MG PO TABS
100.0000 mg | ORAL_TABLET | Freq: Every day | ORAL | Status: DC
  Administered 2016-12-22: 100 mg via ORAL
  Filled 2016-12-21: qty 2

## 2016-12-21 MED ORDER — CLOPIDOGREL BISULFATE 75 MG PO TABS: 75.0000 mg | ORAL_TABLET | Freq: Every day | ORAL | Status: DC

## 2016-12-21 MED ORDER — ENOXAPARIN SODIUM 40 MG/0.4ML ~~LOC~~ SOLN: 40.0000 mg | SUBCUTANEOUS | Status: DC

## 2016-12-21 MED ORDER — IOPAMIDOL (ISOVUE-300) INJECTION 61%
100.0000 mL | Freq: Once | INTRAVENOUS | Status: AC | PRN
  Administered 2016-12-21: 100 mL via INTRAVENOUS

## 2016-12-21 MED ORDER — ACETAMINOPHEN 650 MG RE SUPP: 650.0000 mg | Freq: Four times a day (QID) | RECTAL | Status: DC | PRN

## 2016-12-21 MED ORDER — HYDROCODONE-ACETAMINOPHEN 5-325 MG PO TABS: 1.0000 | ORAL_TABLET | ORAL | Status: DC | PRN

## 2016-12-21 MED ORDER — PANTOPRAZOLE SODIUM 40 MG IV SOLR
40.0000 mg | INTRAVENOUS | Status: DC
  Administered 2016-12-22: 40 mg via INTRAVENOUS
  Filled 2016-12-21: qty 40

## 2016-12-21 MED ORDER — LORAZEPAM 0.5 MG PO TABS
0.5000 mg | ORAL_TABLET | Freq: Once | ORAL | Status: AC
  Administered 2016-12-21: 0.5 mg via ORAL
  Filled 2016-12-21: qty 1

## 2016-12-21 MED ORDER — ACETAMINOPHEN 325 MG PO TABS: 650.0000 mg | ORAL_TABLET | Freq: Four times a day (QID) | ORAL | Status: DC | PRN

## 2016-12-21 NOTE — ED Triage Notes (Signed)
Pt arrives via ems with reports of epigastric pain. EMS reports pt has been having symptoms x2 weeks, pt take nitro to relieve pain. EMS reports pt states nitro helps to relieve the epigastric pain. Pt states nitro helped some today, however still feels discomfort in epigastric region. Pt reports SOB. EMS reports pt has a hx of bigemini rhythm. Pt seems to get slightly winded when talking at present

## 2016-12-21 NOTE — Progress Notes (Signed)
Troponin of 0.04 called to Dr. Jerelyn Charles. No new orders received.

## 2016-12-21 NOTE — ED Notes (Addendum)
Shanon Ace Pt son contact # [phone]  Pt son states pt has a hx of anxiety has a fear of being trapped at home  Son states "mom wont be satisfied unless she spends at least one night in the hospital."

## 2016-12-21 NOTE — H&P (Signed)
[facility] [place] NAME: [name]    MR#:  [id number]  DATE OF BIRTH:  Sep 14, 1930  DATE OF ADMISSION:  12/21/2016  PRIMARY CARE PHYSICIAN: Lajean Manes, MD   REQUESTING/REFERRING PHYSICIAN:   CHIEF COMPLAINT:   Chief Complaint  Patient presents with  . Abdominal Pain    HISTORY OF PRESENT ILLNESS: [name]  is a 83 y.o. female with a known history per below, presents emergency room with acute on chronic epigastric/lower mid chest pain of 6 months duration, had episode of chest pain today lasting approximately 20 minutes or so, occurred while at rest/in seated position, associated with shortness of breath, no diaphoresis, better with nitroglycerin, radiation into back and left shoulder, echocardiogram in October 2017 was a normal study, nuclear medicine stress test in December 2016 has shown mild ischemic basilar inferior wall ischemia, in the emergency room CT noted to be abnormal-with chronic changes, patient evaluated in the emergency room, no apparent distress, resting comfortably in bed, patient now being admitted with acute on chronic atypical chest pain with known abnormal stress test/coronary artery disease.  PAST MEDICAL HISTORY:   Past Medical History:  Diagnosis Date  . Anxiety   . Atypical carcinoid lung tumor ([place])   . CAD (coronary artery disease)    a. 1986: s/p PTCA of bifurcation lesion in LAD;  b. 2001: s/p BMS to LAD & LCX;  c. 04/2006 Cath: diff CAD w/o critical narrowing, nl EF;  d. 06/2010 Echo: EF 60-65%, mild AI/MR.  . Cancer ([place])   . Carotid disease, bilateral ([place])    a. RICA 5-36%, LICA 64-40%  . Cataracts, bilateral   . Cholelithiases   . Depression   . Gilbert's syndrome   . HTN (hypertension)   . Hyperglycemia    mild  . Hyperlipidemia   . Impaired fasting blood sugar   . Keloid    mid sternum  . Macular degeneration   . Osteoarthritis   . Positional vertigo    benign  . Symptoms, such as  flushing, sleeplessness, headache, lack of concentration, associated with the menopause   . TIA (transient ischemic attack)     PAST SURGICAL HISTORY:  Past Surgical History:  Procedure Laterality Date  . carcinoid     removed from lung, [place]  . LEFT HEART CATHETERIZATION WITH CORONARY ANGIOGRAM N/A 04/27/2012   Procedure: LEFT HEART CATHETERIZATION WITH CORONARY ANGIOGRAM;  Surgeon: Peter M Martinique, MD;  Location[facility] C[facility] LAB;  Service: Cardiovascular;  Laterality: N/A;  . pci  1986  . pci of mid cfx     with non des and cutting balloon pci of av circumflex  . pic of lad  2001   with non des lad     SOCIAL HISTORY:  Social History   Tobacco Use  . Smoking status: Never Smoker  . Smokeless tobacco: Never Used  Substance Use Topics  . Alcohol use: No    FAMILY HISTORY:  Family History  Problem Relation Age of Onset  . Diabetes Mellitus I Mother   . CAD Mother   . CAD Father   . Diabetes Mellitus I Sister   . Diabetes Mellitus I Brother   . Diabetes Mellitus I Sister     DRUG ALLERGIES:  Allergies  Allergen Reactions  . Biaxin [Clarithromycin]     depressed  . Celecoxib Other (See Comments)    Reaction: Unknown  . Codeine Other (See Comments)    Reaction: Unknown  .  Lisinopril     cough  . Macrobid [employer] Macro] Other (See Comments)    Reaction: Unknown  . Oxycodone Hcl Other (See Comments)    Reaction: Unknown  . Penicillins Other (See Comments)    Reaction: Unknown  . Sertraline Hcl Other (See Comments)    Reaction: Unknown    REVIEW OF SYSTEMS:   CONSTITUTIONAL: No fever, fatigue or weakness.  EYES: No blurred or double vision.  EARS, NOSE, AND THROAT: No tinnitus or ear pain.  RESPIRATORY: No cough, +shortness of breath, no wheezing or hemoptysis.  CARDIOVASCULAR: + chest pain, no orthopnea, edema.  GASTROINTESTINAL: No nausea, vomiting, diarrhea, + epigastric abdominal pain.  GENITOURINARY: No dysuria, hematuria.  ENDOCRINE: No  polyuria, nocturia,  HEMATOLOGY: No anemia, easy bruising or bleeding SKIN: No rash or lesion. MUSCULOSKELETAL: No joint pain or arthritis.   NEUROLOGIC: No tingling, numbness, weakness.  PSYCHIATRY: No anxiety or depression.   MEDICATIONS AT HOME:  Prior to Admission medications   Medication Sig Start Date End Date Taking? Authorizing Provider  acetaminophen (TYLENOL) 325 MG tablet Take 2 tablets (650 mg total) by mouth every 6 (six) hours as needed for mild pain, fever or headache. 11/07/15   Gouru, Illene Silver, MD  amLODipine (NORVASC) 10 MG tablet Take 10 mg by mouth daily.    [provider]  aspirin 81 MG tablet Take 81 mg by mouth daily.    [provider]  atorvastatin (LIPITOR) 10 MG tablet Take 10 mg by mouth daily.    [provider]  Calcium Carbonate-Vitamin D (CALCIUM 600 + D PO) Take 1 tablet by mouth daily. 600 mg/ 400 mg    [provider]  clopidogrel (PLAVIX) 75 MG tablet Take 75 mg by mouth daily.    [provider]  estrogens, conjugated, (PREMARIN) 0.3 MG tablet Take 0.3 mg by mouth daily. Take daily for 21 days then do not take for 7 days.    [provider]  furosemide (LASIX) 20 MG tablet Take 20 mg by mouth daily.    [provider]  guaiFENesin-dextromethorphan (ROBITUSSIN DM) 100-10 MG/5ML syrup Take 10 mLs by mouth every 6 (six) hours as needed for cough. 11/07/15   Nicholes Mango, MD  isosorbide mononitrate (IMDUR) 30 MG 24 hr tablet TAKE 0.5 TABLETS (15 MG TOTAL) BY MOUTH DAILY. 12/21/16   Dunn, Ryan M, PA-C  KLOR-CON M20 20 MEQ tablet TAKE 1 TABLET (20 MEQ TOTAL) BY MOUTH 2 (TWO) TIMES DAILY. 09/08/16   Minna Merritts, MD  LORazepam (ATIVAN) 0.5 MG tablet Take 0.5 mg by mouth every 8 (eight) hours as needed for anxiety.     [provider]  losartan (COZAAR) 100 MG tablet Take 1 tablet (100 mg total) by mouth daily. 09/10/16 12/09/16  Minna Merritts, MD  metoprolol succinate (TOPROL-XL) 50 MG 24  hr tablet TAKE 1 TABLET (50 MG TOTAL) BY MOUTH DAILY. TAKE WITH OR IMMEDIATELY FOLLOWING A MEAL. 05/15/16   Minna Merritts, MD  Multiple Vitamin (MULTIVITAMIN) tablet Take 1 tablet by mouth daily.    [provider]  nitroGLYCERIN (NITROSTAT) 0.4 MG SL tablet PLACE 1 TABLET (0.4 MG TOTAL) UNDER THE TONGUE EVERY 5 (FIVE) MINUTES AS NEEDED FOR CHEST PAIN. 10/23/16   Minna Merritts, MD  nitroGLYCERIN (NITROSTAT) 0.4 MG SL tablet PLACE 1 TABLET (0.4 MG TOTAL) UNDER THE TONGUE EVERY 5 (FIVE) MINUTES AS NEEDED FOR CHEST PAIN. 12/10/16   Minna Merritts, MD  omeprazole (PRILOSEC) 20 MG capsule  Take 20 mg by mouth daily.    [provider]  ranitidine (ZANTAC) 150 MG tablet Take 1 tablet (150 mg total) by mouth 2 (two) times daily. 07/08/16 07/08/17  Merlyn Lot, MD      PHYSICAL EXAMINATION:   VITAL SIGNS: Blood pressure (!) 143/64, pulse 74, temperature 97.6 F (36.4 C), temperature source Oral, resp. rate 15, height 5\' 7"  (1.702 m), weight 77.1 kg (170 lb), SpO2 96 %.  GENERAL:  83 y.o.-year-old patient lying in the bed with no acute distress.  EYES: Pupils equal, round, reactive to light and accommodation. No scleral icterus. Extraocular muscles intact.  HEENT: Head atraumatic, normocephalic. Oropharynx and nasopharynx clear.  NECK:  Supple, no jugular venous distention. No thyroid enlargement, no tenderness.  LUNGS: Normal breath sounds bilaterally, no wheezing, rales,rhonchi or crepitation. No use of accessory muscles of respiration.  CARDIOVASCULAR: S1, S2 normal. No murmurs, rubs, or gallops.  ABDOMEN: Soft, nontender, nondistended. Bowel sounds present. No organomegaly or mass.  EXTREMITIES: No pedal edema, cyanosis, or clubbing.  NEUROLOGIC: Cranial nerves II through XII are intact. MAES. Gait not checked.  PSYCHIATRIC: The patient is alert and oriented x 3.  SKIN: No obvious rash, lesion, or ulcer.   LABORATORY PANEL:   CBC Recent Labs  Lab 12/21/16 1812   WBC 6.5  HGB 12.9  HCT 38.6  PLT 178  MCV 87.7  MCH 29.3  MCHC 33.5  RDW 13.8  LYMPHSABS 0.9*  MONOABS 0.5  EOSABS 0.1  BASOSABS 0.0   ------------------------------------------------------------------------------------------------------------------  Chemistries  Recent Labs  Lab 12/21/16 1812  NA 137  K 4.0  CL 106  CO2 24  GLUCOSE 147*  BUN 13  CREATININE 0.85  CALCIUM 9.0  AST 29  ALT 17  ALKPHOS 104  BILITOT 1.6*   ------------------------------------------------------------------------------------------------------------------ estimated creatinine clearance is 52.7 mL/min (by C-G formula based on SCr of 0.85 mg/dL). ------------------------------------------------------------------------------------------------------------------ No results for input(s): TSH, T4TOTAL, T3FREE, THYROIDAB in the last 72 hours.  Invalid input(s): FREET3   Coagulation profile No results for input(s): INR, PROTIME in the last 168 hours. ------------------------------------------------------------------------------------------------------------------- No results for input(s): DDIMER in the last 72 hours. -------------------------------------------------------------------------------------------------------------------  Cardiac Enzymes Recent Labs  Lab 12/21/16 1812  TROPONINI <0.03   ------------------------------------------------------------------------------------------------------------------ Invalid input(s): POCBNP  ---------------------------------------------------------------------------------------------------------------  Urinalysis    Component Value Date/Time   COLORURINE YELLOW (A) 12/21/2016 1827   APPEARANCEUR CLEAR (A) 12/21/2016 1827   APPEARANCEUR Clear 11/03/2013 1527   LABSPEC 1.010 12/21/2016 1827   LABSPEC 1.006 11/03/2013 1527   PHURINE 6.0 12/21/2016 1827   GLUCOSEU NEGATIVE 12/21/2016 1827   GLUCOSEU Negative 11/03/2013 1527   GLUCOSEU  NEGATIVE 03/30/2011 1306   HGBUR NEGATIVE 12/21/2016 1827   BILIRUBINUR NEGATIVE 12/21/2016 1827   BILIRUBINUR Negative 11/03/2013 [place] 12/21/2016 1827   PROTEINUR NEGATIVE 12/21/2016 1827   UROBILINOGEN 0.2 03/30/2011 1306   NITRITE NEGATIVE 12/21/2016 1827   LEUKOCYTESUR NEGATIVE 12/21/2016 1827   LEUKOCYTESUR Negative 11/03/2013 1527     RADIOLOGY: Ct Abdomen Pelvis W Contrast  Addendum Date: 12/21/2016   ADDENDUM REPORT: 12/21/2016 19:52 ADDENDUM: This is an addendum to the original report. The pneumobilia identified on today's study was present on study from 07/08/2016 but is a new finding when compared with 10/29/2011. Therefore, this may reflect a benign process such as pneumobilia due to instrumentation. Further. in the absence of signs or symptoms of infection this makes the original consideration for cholangitis less likely. The filling defects discussed in today's report however appear to  be a new finding from 10/29/2011. I cannot confidently confirm the these were present on study from 07/08/2016. And MRCP may be helpful to confirm presence of choledocholithiasis. These findings were discussed with Dr. Merlyn Lot at 12/21/2016, 7:51 p.m. Electronically Signed   By: Kerby Moors M.D.   On: 12/21/2016 19:52   Result Date: 12/21/2016 CLINICAL DATA:  Abdominal pain and fever. EXAM: CT ABDOMEN AND PELVIS WITH CONTRAST TECHNIQUE: Multidetector CT imaging of the abdomen and pelvis was performed using the standard protocol following bolus administration of intravenous contrast. CONTRAST:  116mL ISOVUE-300 IOPAMIDOL (ISOVUE-300) INJECTION 61% COMPARISON:  07/08/2016 FINDINGS: Lower chest: There is a small right pleural effusion. Unchanged from previous exam. Hepatobiliary: No focal liver abnormality. Pneumobilia is identified status post cholecystectomy. This is new when compared with 07/08/2016 and 10/29/2011. Soft tissue attenuating filling defects within the  common bile duct are identified which appear new from previous exam. Pancreas: Unremarkable. No pancreatic ductal dilatation or surrounding inflammatory changes. Spleen: Normal in size without focal abnormality. Adrenals/Urinary Tract: The adrenal glands appear normal. Unremarkable appearance of the kidneys. No mass or hydronephrosis. Urinary bladder is unremarkable. Stomach/Bowel: The stomach is normal. The small bowel loops have a normal course and caliber without obstruction. The appendix is not visualized. No secondary signs of acute appendicitis. Vascular/Lymphatic: Aortic atherosclerosis. Branch vessel aortic atherosclerotic calcifications noted. No aneurysm. No adenopathy within the abdomen or pelvis. Reproductive: Status post hysterectomy. No adnexal masses. Other: No Foster fluid or fluid collections. Musculoskeletal: Degenerative disc disease noted within the lumbar spine. IMPRESSION: 1. Interval development of pneumobilia. There also several soft tissue attenuating filling defects within the common bile duct which may represent sequelae of inflammation/ infection or choledocholithiasis, this is new from 07/08/2016. In the setting of suspected infection and elevated bilirubin cholangitis cannot be excluded. 2. No intra-abdominal abscess identified. 3.  Aortic Atherosclerosis (ICD10-I70.0). 4. Chronic right pleural effusion. Electronically Signed: By: Kerby Moors M.D. On: 12/21/2016 19:24   Dg Chest Portable 1 View  Result Date: 12/21/2016 CLINICAL DATA:  Epigastric pain EXAM: PORTABLE CHEST 1 VIEW COMPARISON:  07/08/2016 FINDINGS: Cardiac shadow is stable. Aortic calcifications are again seen. The lungs are well aerated. Postsurgical changes are noted on the right with mild volume loss. No acute infiltrate or sizable effusion is seen. No bony abnormality is noted. IMPRESSION: Postoperative changes on the right.  No acute abnormality seen. Electronically Signed   By: Inez Catalina M.D.   On: 12/21/2016  18:17    EKG: Orders placed or performed during the hospital encounter of 12/21/16  . ED EKG  . ED EKG  . EKG 12-Lead  . EKG 12-Lead    IMPRESSION AND PLAN: 1 acute on chronic atypical epigastric/chest pain Referred to the observation unit acute coronary syndrome protocol, rule out acute coronary syndrome with cardiac enzymes x3 sets, nuclear medicine stress testing in the morning for reevaluation, check lipids, continue DAPT, increase statin therapy-check lipids in the morning, nitrates as needed, supplemental oxygen as needed, morphine as needed breakthrough pain, beta-blocker therapy, art therapy, and continue close medical monitoring ?  May benefit from heart catheterization  2 incomplete MAR Complete when available  3 chronic benign essential hypertension Relatively stable Beta-blocker therapy, losartan, hydralazine as needed systolic blood pressure greater than 160, vitals per routine, make changes as per necessary  4 chronic hyperlipidemia, unspecified Plan of care as stated above  5 acute on chronic abnormal CT scan of the abdomen Stable Continue conservative observation/management Abdominal exam is benign  DNR status Condition stable Prognosis fair DVT prophylaxis on Lovenox subcu Disposition home tomorrow barring any complications w/ cardiology clearance   All the records are reviewed and case discussed with ED provider. Management plans discussed with the patient, family and they are in agreement.  CODE STATUS: Code Status History    Date Active Date Inactive Code Status Order ID Comments User Context   11/07/2015 00:49 11/07/2015 18:05 Full Code [id number]  Harvie Bridge, DO Inpatient   12/28/2014 09:16 12/30/2014 16:33 Full Code [id number]  Fritzi Mandes, MD Inpatient       TOTAL TIME TAKING CARE OF THIS PATIENT: 45 minutes.    Avel Peace Zohaib Heeney M.D on 12/21/2016   Between 7am to 6pm - Pager - [phone]  After 6pm go to [email or web address] - password  [facility]  Office  [phone]  CC: Primary care physician; Lajean Manes, MD   Note: This dictation was prepared with Dragon dictation along with smaller phrase technology. Any transcriptional errors that result from this process are unintentional.

## 2016-12-21 NOTE — ED Provider Notes (Signed)
[facility] Emergency Department Provider Note    None    (approximate)  I have reviewed the triage vital signs and the nursing notes.   HISTORY  Chief Complaint Abdominal Pain    HPI [name] is a 83 y.o. female with multiple chronic medical conditions improved since from home with 2 weeks of worsening epigastric discomfort.  States that she is having intermittent shoulder pain and discomfort radiating through to her back.  Pain became worse today.  Occurred as she woke up from a nap and had sudden crushing chest pain shortness of breath.  States she was also having nausea.  Cannot recall she is having any diaphoresis.  States that she took nitroglycerin with some improvement but not complete resolution of her pain.  Has had "blockages" in the past.  Has had decreased oral intake.  No measured fevers.  No dysuria.  No diarrhea.  States she is not passing gas.  Because of the discomfort is mild to moderate in severity without radiation  Past Medical History:  Diagnosis Date  . Anxiety   . Atypical carcinoid lung tumor ([place])   . CAD (coronary artery disease)    a. 1986: s/p PTCA of bifurcation lesion in LAD;  b. 2001: s/p BMS to LAD & LCX;  c. 04/2006 Cath: diff CAD w/o critical narrowing, nl EF;  d. 06/2010 Echo: EF 60-65%, mild AI/MR.  . Cancer ([place])   . Carotid disease, bilateral ([place])    a. RICA 9-20%, LICA 10-07%  . Cataracts, bilateral   . Cholelithiases   . Depression   . Gilbert's syndrome   . HTN (hypertension)   . Hyperglycemia    mild  . Hyperlipidemia   . Impaired fasting blood sugar   . Keloid    mid sternum  . Macular degeneration   . Osteoarthritis   . Positional vertigo    benign  . Symptoms, such as flushing, sleeplessness, headache, lack of concentration, associated with the menopause   . TIA (transient ischemic attack)    Family History  Problem Relation Age of Onset  . Diabetes Mellitus I Mother   . CAD Mother   . CAD  Father   . Diabetes Mellitus I Sister   . Diabetes Mellitus I Brother   . Diabetes Mellitus I Sister    Past Surgical History:  Procedure Laterality Date  . carcinoid     removed from lung, [place]  . LEFT HEART CATHETERIZATION WITH CORONARY ANGIOGRAM N/A 04/27/2012   Procedure: LEFT HEART CATHETERIZATION WITH CORONARY ANGIOGRAM;  Surgeon: Peter M Martinique, MD;  Location: [facility] CATH LAB;  Service: Cardiovascular;  Laterality: N/A;  . pci  1986  . pci of mid cfx     with non des and cutting balloon pci of av circumflex  . pic of lad  2001   with non des lad    Patient Active Problem List   Diagnosis Date Noted  . Weakness of lower extremity 12/09/2015  . Viral URI 11/07/2015  . Hypokalemia 11/07/2015  . Cough   . Diaphoresis   . Sore throat   . Atypical chest pain 11/06/2015  . NSTEMI (non-ST elevated myocardial infarction) ([place]) 12/29/2014  . Chest pain 12/28/2014  . Angina pectoris ([place])   . Coronary artery disease involving native coronary artery of native heart with angina pectoris with documented spasm ([place])   . Emesis   . Gastroenteritis, acute   . PVC's (premature ventricular contractions) 04/25/2012  . Cardiac  arrest ([place])   . Left carotid stenosis 03/01/2012  . Leg fatigue 03/01/2012  . Dysuria 03/30/2011  . Mitral regurgitation 03/30/2011  . HYPERCHOLESTEROLEMIA  IIA 08/06/2008  . Carotid bruit 05/28/2008  . Essential hypertension 05/11/2008  . Coronary atherosclerosis 05/11/2008  . Angina at rest [facility]) 05/11/2008      Prior to Admission medications   Medication Sig Start Date End Date Taking? Authorizing Provider  acetaminophen (TYLENOL) 325 MG tablet Take 2 tablets (650 mg total) by mouth every 6 (six) hours as needed for mild pain, fever or headache. 11/07/15   Gouru, Illene Silver, MD  amLODipine (NORVASC) 10 MG tablet Take 10 mg by mouth daily.    [provider]  aspirin 81 MG tablet Take 81 mg by mouth daily.    [provider]  atorvastatin  (LIPITOR) 10 MG tablet Take 10 mg by mouth daily.    [provider]  Calcium Carbonate-Vitamin D (CALCIUM 600 + D PO) Take 1 tablet by mouth daily. 600 mg/ 400 mg    [provider]  clopidogrel (PLAVIX) 75 MG tablet Take 75 mg by mouth daily.    [provider]  estrogens, conjugated, (PREMARIN) 0.3 MG tablet Take 0.3 mg by mouth daily. Take daily for 21 days then do not take for 7 days.    [provider]  furosemide (LASIX) 20 MG tablet Take 20 mg by mouth daily.    [provider]  guaiFENesin-dextromethorphan (ROBITUSSIN DM) 100-10 MG/5ML syrup Take 10 mLs by mouth every 6 (six) hours as needed for cough. 11/07/15   Nicholes Mango, MD  isosorbide mononitrate (IMDUR) 30 MG 24 hr tablet TAKE 0.5 TABLETS (15 MG TOTAL) BY MOUTH DAILY. 12/21/16   Dunn, Ryan M, PA-C  KLOR-CON M20 20 MEQ tablet TAKE 1 TABLET (20 MEQ TOTAL) BY MOUTH 2 (TWO) TIMES DAILY. 09/08/16   Minna Merritts, MD  LORazepam (ATIVAN) 0.5 MG tablet Take 0.5 mg by mouth every 8 (eight) hours as needed for anxiety.     [provider]  losartan (COZAAR) 100 MG tablet Take 1 tablet (100 mg total) by mouth daily. 09/10/16 12/09/16  Minna Merritts, MD  metoprolol succinate (TOPROL-XL) 50 MG 24 hr tablet TAKE 1 TABLET (50 MG TOTAL) BY MOUTH DAILY. TAKE WITH OR IMMEDIATELY FOLLOWING A MEAL. 05/15/16   Minna Merritts, MD  Multiple Vitamin (MULTIVITAMIN) tablet Take 1 tablet by mouth daily.    [provider]  nitroGLYCERIN (NITROSTAT) 0.4 MG SL tablet PLACE 1 TABLET (0.4 MG TOTAL) UNDER THE TONGUE EVERY 5 (FIVE) MINUTES AS NEEDED FOR CHEST PAIN. 10/23/16   Minna Merritts, MD  nitroGLYCERIN (NITROSTAT) 0.4 MG SL tablet PLACE 1 TABLET (0.4 MG TOTAL) UNDER THE TONGUE EVERY 5 (FIVE) MINUTES AS NEEDED FOR CHEST PAIN. 12/10/16   Minna Merritts, MD  omeprazole (PRILOSEC) 20 MG capsule Take 20 mg by mouth daily.    [provider]  ranitidine (ZANTAC) 150 MG tablet Take 1  tablet (150 mg total) by mouth 2 (two) times daily. 07/08/16 07/08/17  Merlyn Lot, MD    Allergies Biaxin [clarithromycin]; Celecoxib; Codeine; Lisinopril; Macrobid [nitrofurantoin monohyd macro]; Oxycodone hcl; Penicillins; and Sertraline hcl    Social History Social History   Tobacco Use  . Smoking status: Never Smoker  . Smokeless tobacco: Never Used  Substance Use Topics  . Alcohol use: No  . Drug use: No    Review of Systems Patient denies headaches, rhinorrhea, blurry vision, numbness, shortness of  breath, chest pain, edema, cough, abdominal pain, nausea, vomiting, diarrhea, dysuria, fevers, rashes or hallucinations unless otherwise stated above in HPI. ____________________________________________   PHYSICAL EXAM:  VITAL SIGNS: Vitals:   12/21/16 1945 12/21/16 2000  BP: (!) 145/54 (!) 152/66  Pulse: 76 70  Resp: 14 17  Temp:    SpO2: 99% 97%    Constitutional: Alert and oriented. Well appearing and in no acute distress. Eyes: Conjunctivae are normal.  Head: Atraumatic. Nose: No congestion/rhinnorhea. Mouth/Throat: Mucous membranes are moist.   Neck: No stridor. Painless ROM.  Cardiovascular: Normal rate, regular rhythm. Grossly normal heart sounds.  Good peripheral circulation. Respiratory: Normal respiratory effort.  No retractions. Lungs CTAB. Gastrointestinal: Soft and nontender. Hyperactive BS. No abdominal bruits. No CVA tenderness. Genitourinary:  Musculoskeletal: No lower extremity tenderness nor edema.  No joint effusions. Neurologic:  Normal speech and language. No gross focal neurologic deficits are appreciated. No facial droop Skin:  Skin is warm, dry and intact. No rash noted. Psychiatric: Mood and affect are normal. Speech and behavior are normal.  ____________________________________________   LABS (all labs ordered are listed, but only abnormal results are displayed)  Results for orders placed or performed during the hospital encounter  of 12/21/16 (from the past 24 hour(s))  CBC with Differential/Platelet     Status: Abnormal   Collection Time: 12/21/16  6:12 PM  Result Value Ref Range   WBC 6.5 3.6 - 11.0 K/uL   RBC 4.40 3.80 - 5.20 MIL/uL   Hemoglobin 12.9 12.0 - 16.0 g/dL   HCT 38.6 35.0 - 47.0 %   MCV 87.7 80.0 - 100.0 fL   MCH 29.3 26.0 - 34.0 pg   MCHC 33.5 32.0 - 36.0 g/dL   RDW 13.8 11.5 - 14.5 %   Platelets 178 150 - 440 K/uL   Neutrophils Relative % 75 %   Neutro Abs 4.9 1.4 - 6.5 K/uL   Lymphocytes Relative 14 %   Lymphs Abs 0.9 (L) 1.0 - 3.6 K/uL   Monocytes Relative 8 %   Monocytes Absolute 0.5 0.2 - 0.9 K/uL   Eosinophils Relative 2 %   Eosinophils Absolute 0.1 0 - 0.7 K/uL   Basophils Relative 1 %   Basophils Absolute 0.0 0 - 0.1 K/uL  Comprehensive metabolic panel     Status: Abnormal   Collection Time: 12/21/16  6:12 PM  Result Value Ref Range   Sodium 137 135 - 145 mmol/L   Potassium 4.0 3.5 - 5.1 mmol/L   Chloride 106 101 - 111 mmol/L   CO2 24 22 - 32 mmol/L   Glucose, Bld 147 (H) 65 - 99 mg/dL   BUN 13 6 - 20 mg/dL   Creatinine, Ser 0.85 0.44 - 1.00 mg/dL   Calcium 9.0 8.9 - 10.3 mg/dL   Total Protein 6.7 6.5 - 8.1 g/dL   Albumin 3.7 3.5 - 5.0 g/dL   AST 29 15 - 41 U/L   ALT 17 14 - 54 U/L   Alkaline Phosphatase 104 38 - 126 U/L   Total Bilirubin 1.6 (H) 0.3 - 1.2 mg/dL   GFR calc non Af Amer >60 >60 mL/min   GFR calc Af Amer >60 >60 mL/min   Anion gap 7 5 - 15  Lipase, blood     Status: None   Collection Time: 12/21/16  6:12 PM  Result Value Ref Range   Lipase 30 11 - 51 U/L  Troponin I     Status: None   Collection Time: 12/21/16  6:12 PM  Result Value Ref Range   Troponin I <0.03 <0.03 ng/mL  Urinalysis, Complete w Microscopic     Status: Abnormal   Collection Time: 12/21/16  6:27 PM  Result Value Ref Range   Color, Urine YELLOW (A) YELLOW   APPearance CLEAR (A) CLEAR   Specific Gravity, Urine 1.010 1.005 - 1.030   pH 6.0 5.0 - 8.0   Glucose, UA NEGATIVE NEGATIVE  mg/dL   Hgb urine dipstick NEGATIVE NEGATIVE   Bilirubin Urine NEGATIVE NEGATIVE   Ketones, ur NEGATIVE NEGATIVE mg/dL   Protein, ur NEGATIVE NEGATIVE mg/dL   Nitrite NEGATIVE NEGATIVE   Leukocytes, UA NEGATIVE NEGATIVE   RBC / HPF 0-5 0 - 5 RBC/hpf   WBC, UA 0-5 0 - 5 WBC/hpf   Bacteria, UA NONE SEEN NONE SEEN   Squamous Epithelial / LPF 0-5 (A) NONE SEEN   Mucus PRESENT    ____________________________________________  EKG My review and personal interpretation at Time: 18:02   Indication: chest discomfort  Rate: 70  Rhythm: sinus Axis: normal Other:  Down sloping st changes in inferolateral distribution, no stemi, normal intervals ____________________________________________  RADIOLOGY  I personally reviewed all radiographic images ordered to evaluate for the above acute complaints and reviewed radiology reports and findings.  These findings were personally discussed with the patient.  Please see medical record for radiology report.  ____________________________________________   PROCEDURES  Procedure(s) performed:  Procedures    Critical Care performed: no ____________________________________________   INITIAL IMPRESSION / ASSESSMENT AND PLAN / ED COURSE  Pertinent labs & imaging results that were available during my care of the patient were reviewed by me and considered in my medical decision making (see chart for details).  DDX: ACS, pericarditis, esophagitis, boerhaaves, pe, dissection, pna, bronchitis, costochondritis   [name] is a 83 y.o. who presents to the ED with epigastric and chest pain as described above.  Based on her symptoms and concern for small bowel obstruction or otitis.  She is afebrile and otherwise hemodynamically stable. May have component of ACS.  No evidence of pneumonia.  Patient is status post cholecystectomy.  Will provide IV pain medication, IV antiemetics..  Patient does have some subtle ST downsloping changes on EKG concerning for  some component of cardiac strain.  No evidence of STEMI.  Clinical Course as of Dec 22 2114  Mon Dec 21, 2016  1952 CT findings with Dr. Clovis Riley of radiology.  Does appear that the pneumobilia is chronic.  Her T bilirubin is stable.  This does not reflect cholangitis or choledocholithiasis  [PR]  2033 Patient reassessed.  No respiratory distress.  States that symptoms improved after antiemetic as well as Ativan.  Is on EKG changes with high risk features and no recent provocative testing I have offered patient observation in the hospital for further evaluation of chest pain.  Have discussed with the patient and available family all diagnostics and treatments performed thus far and all questions were answered to the best of my ability. The patient demonstrates understanding and agreement with plan.   [PR]    Clinical Course User Index [PR] Merlyn Lot, MD     ____________________________________________   FINAL CLINICAL IMPRESSION(S) / ED DIAGNOSES  Final diagnoses:  Epigastric pain  Chest pain, unspecified type      NEW MEDICATIONS STARTED DURING THIS VISIT:  This SmartLink is deprecated. Use AVSMEDLIST instead to display the medication list for a patient.   Note:  This document was prepared using Dragon voice  recognition software and may include unintentional dictation errors.    Merlyn Lot, MD 12/21/16 2116

## 2016-12-22 ENCOUNTER — Inpatient Hospital Stay (HOSPITAL_COMMUNITY)
Admit: 2016-12-22 | Discharge: 2016-12-22 | Disposition: A | Discharge status: Home / Self Care | Payer: Medicare Other | Attending: Internal Medicine | Admitting: Internal Medicine

## 2016-12-22 ENCOUNTER — Other Ambulatory Visit: Payer: Self-pay | Admitting: Nurse Practitioner

## 2016-12-22 ENCOUNTER — Inpatient Hospital Stay: Payer: Medicare Other

## 2016-12-22 ENCOUNTER — Other Ambulatory Visit: Payer: Self-pay

## 2016-12-22 ENCOUNTER — Inpatient Hospital Stay (HOSPITAL_COMMUNITY)
Admission: EM | Admit: 2016-12-22 | Discharge: 2017-01-02 | DRG: 235 | Disposition: A | Discharge status: Skilled Nursing Facility | Payer: Medicare Other | Source: Other Acute Inpatient Hospital | Attending: Thoracic Surgery (Cardiothoracic Vascular Surgery) | Admitting: Thoracic Surgery (Cardiothoracic Vascular Surgery)

## 2016-12-22 ENCOUNTER — Encounter: Payer: Self-pay | Admitting: Nurse Practitioner

## 2016-12-22 ENCOUNTER — Encounter
Admission: EM | Disposition: A | Discharge status: Other Acute Inpatient Hospital | Payer: Self-pay | Source: Home / Self Care | Attending: Internal Medicine

## 2016-12-22 ENCOUNTER — Encounter (HOSPITAL_COMMUNITY): Payer: Self-pay | Admitting: Nurse Practitioner

## 2016-12-22 ENCOUNTER — Inpatient Hospital Stay (HOSPITAL_COMMUNITY): Payer: Medicare Other

## 2016-12-22 DIAGNOSIS — Z8673 Personal history of transient ischemic attack (TIA), and cerebral infarction without residual deficits: Secondary | ICD-10-CM

## 2016-12-22 DIAGNOSIS — D6959 Other secondary thrombocytopenia: Secondary | ICD-10-CM | POA: Diagnosis not present

## 2016-12-22 DIAGNOSIS — Z9861 Coronary angioplasty status: Secondary | ICD-10-CM | POA: Diagnosis not present

## 2016-12-22 DIAGNOSIS — Z9889 Other specified postprocedural states: Secondary | ICD-10-CM | POA: Diagnosis not present

## 2016-12-22 DIAGNOSIS — Z955 Presence of coronary angioplasty implant and graft: Secondary | ICD-10-CM

## 2016-12-22 DIAGNOSIS — Z888 Allergy status to other drugs, medicaments and biological substances status: Secondary | ICD-10-CM | POA: Diagnosis not present

## 2016-12-22 DIAGNOSIS — K529 Noninfective gastroenteritis and colitis, unspecified: Secondary | ICD-10-CM | POA: Diagnosis present

## 2016-12-22 DIAGNOSIS — Z86018 Personal history of other benign neoplasm: Secondary | ICD-10-CM | POA: Diagnosis not present

## 2016-12-22 DIAGNOSIS — R079 Chest pain, unspecified: Secondary | ICD-10-CM | POA: Diagnosis not present

## 2016-12-22 DIAGNOSIS — I5030 Unspecified diastolic (congestive) heart failure: Secondary | ICD-10-CM | POA: Diagnosis not present

## 2016-12-22 DIAGNOSIS — Z7902 Long term (current) use of antithrombotics/antiplatelets: Secondary | ICD-10-CM | POA: Diagnosis not present

## 2016-12-22 DIAGNOSIS — R570 Cardiogenic shock: Secondary | ICD-10-CM | POA: Diagnosis present

## 2016-12-22 DIAGNOSIS — I200 Unstable angina: Secondary | ICD-10-CM

## 2016-12-22 DIAGNOSIS — I2089 Other forms of angina pectoris: Secondary | ICD-10-CM | POA: Diagnosis present

## 2016-12-22 DIAGNOSIS — I209 Angina pectoris, unspecified: Secondary | ICD-10-CM | POA: Diagnosis present

## 2016-12-22 DIAGNOSIS — Z7982 Long term (current) use of aspirin: Secondary | ICD-10-CM

## 2016-12-22 DIAGNOSIS — E8770 Fluid overload, unspecified: Secondary | ICD-10-CM | POA: Diagnosis not present

## 2016-12-22 DIAGNOSIS — R609 Edema, unspecified: Secondary | ICD-10-CM | POA: Diagnosis not present

## 2016-12-22 DIAGNOSIS — I25110 Atherosclerotic heart disease of native coronary artery with unstable angina pectoris: Principal | ICD-10-CM | POA: Diagnosis present

## 2016-12-22 DIAGNOSIS — I259 Chronic ischemic heart disease, unspecified: Secondary | ICD-10-CM

## 2016-12-22 DIAGNOSIS — Z66 Do not resuscitate: Secondary | ICD-10-CM | POA: Diagnosis present

## 2016-12-22 DIAGNOSIS — I249 Acute ischemic heart disease, unspecified: Secondary | ICD-10-CM | POA: Diagnosis not present

## 2016-12-22 DIAGNOSIS — E785 Hyperlipidemia, unspecified: Secondary | ICD-10-CM | POA: Diagnosis present

## 2016-12-22 DIAGNOSIS — I700 Atherosclerosis of aorta: Secondary | ICD-10-CM | POA: Diagnosis present

## 2016-12-22 DIAGNOSIS — I214 Non-ST elevation (NSTEMI) myocardial infarction: Secondary | ICD-10-CM | POA: Diagnosis present

## 2016-12-22 DIAGNOSIS — I083 Combined rheumatic disorders of mitral, aortic and tricuspid valves: Secondary | ICD-10-CM | POA: Diagnosis not present

## 2016-12-22 DIAGNOSIS — F329 Major depressive disorder, single episode, unspecified: Secondary | ICD-10-CM | POA: Diagnosis not present

## 2016-12-22 DIAGNOSIS — Z880 Allergy status to penicillin: Secondary | ICD-10-CM | POA: Diagnosis not present

## 2016-12-22 DIAGNOSIS — M6281 Muscle weakness (generalized): Secondary | ICD-10-CM | POA: Diagnosis not present

## 2016-12-22 DIAGNOSIS — F419 Anxiety disorder, unspecified: Secondary | ICD-10-CM | POA: Diagnosis not present

## 2016-12-22 DIAGNOSIS — S60222A Contusion of left hand, initial encounter: Secondary | ICD-10-CM | POA: Diagnosis present

## 2016-12-22 DIAGNOSIS — I208 Other forms of angina pectoris: Secondary | ICD-10-CM | POA: Diagnosis present

## 2016-12-22 DIAGNOSIS — Y848 Other medical procedures as the cause of abnormal reaction of the patient, or of later complication, without mention of misadventure at the time of the procedure: Secondary | ICD-10-CM | POA: Diagnosis present

## 2016-12-22 DIAGNOSIS — E569 Vitamin deficiency, unspecified: Secondary | ICD-10-CM | POA: Diagnosis not present

## 2016-12-22 DIAGNOSIS — D367 Benign neoplasm of other specified sites: Secondary | ICD-10-CM | POA: Diagnosis present

## 2016-12-22 DIAGNOSIS — R0789 Other chest pain: Secondary | ICD-10-CM | POA: Diagnosis not present

## 2016-12-22 DIAGNOSIS — R0602 Shortness of breath: Secondary | ICD-10-CM | POA: Diagnosis not present

## 2016-12-22 DIAGNOSIS — I2510 Atherosclerotic heart disease of native coronary artery without angina pectoris: Secondary | ICD-10-CM

## 2016-12-22 DIAGNOSIS — K219 Gastro-esophageal reflux disease without esophagitis: Secondary | ICD-10-CM | POA: Diagnosis not present

## 2016-12-22 DIAGNOSIS — Z833 Family history of diabetes mellitus: Secondary | ICD-10-CM | POA: Diagnosis not present

## 2016-12-22 DIAGNOSIS — E876 Hypokalemia: Secondary | ICD-10-CM | POA: Diagnosis not present

## 2016-12-22 DIAGNOSIS — R29898 Other symptoms and signs involving the musculoskeletal system: Secondary | ICD-10-CM | POA: Diagnosis present

## 2016-12-22 DIAGNOSIS — Z8249 Family history of ischemic heart disease and other diseases of the circulatory system: Secondary | ICD-10-CM

## 2016-12-22 DIAGNOSIS — I10 Essential (primary) hypertension: Secondary | ICD-10-CM | POA: Diagnosis present

## 2016-12-22 DIAGNOSIS — I493 Ventricular premature depolarization: Secondary | ICD-10-CM | POA: Diagnosis present

## 2016-12-22 DIAGNOSIS — J90 Pleural effusion, not elsewhere classified: Secondary | ICD-10-CM | POA: Diagnosis not present

## 2016-12-22 DIAGNOSIS — Z452 Encounter for adjustment and management of vascular access device: Secondary | ICD-10-CM | POA: Diagnosis not present

## 2016-12-22 DIAGNOSIS — T82855A Stenosis of coronary artery stent, initial encounter: Secondary | ICD-10-CM | POA: Diagnosis present

## 2016-12-22 DIAGNOSIS — Z951 Presence of aortocoronary bypass graft: Secondary | ICD-10-CM

## 2016-12-22 DIAGNOSIS — Z881 Allergy status to other antibiotic agents status: Secondary | ICD-10-CM

## 2016-12-22 DIAGNOSIS — D62 Acute posthemorrhagic anemia: Secondary | ICD-10-CM | POA: Diagnosis not present

## 2016-12-22 DIAGNOSIS — Z79890 Hormone replacement therapy (postmenopausal): Secondary | ICD-10-CM | POA: Diagnosis not present

## 2016-12-22 DIAGNOSIS — Z886 Allergy status to analgesic agent status: Secondary | ICD-10-CM

## 2016-12-22 DIAGNOSIS — E7800 Pure hypercholesterolemia, unspecified: Secondary | ICD-10-CM | POA: Diagnosis not present

## 2016-12-22 DIAGNOSIS — J9811 Atelectasis: Secondary | ICD-10-CM | POA: Diagnosis not present

## 2016-12-22 DIAGNOSIS — Z885 Allergy status to narcotic agent status: Secondary | ICD-10-CM | POA: Diagnosis not present

## 2016-12-22 DIAGNOSIS — J920 Pleural plaque with presence of asbestos: Secondary | ICD-10-CM | POA: Diagnosis not present

## 2016-12-22 DIAGNOSIS — Z9049 Acquired absence of other specified parts of digestive tract: Secondary | ICD-10-CM | POA: Diagnosis not present

## 2016-12-22 DIAGNOSIS — Z902 Acquired absence of lung [part of]: Secondary | ICD-10-CM | POA: Diagnosis not present

## 2016-12-22 DIAGNOSIS — F411 Generalized anxiety disorder: Secondary | ICD-10-CM | POA: Diagnosis not present

## 2016-12-22 DIAGNOSIS — I2584 Coronary atherosclerosis due to calcified coronary lesion: Secondary | ICD-10-CM | POA: Diagnosis present

## 2016-12-22 DIAGNOSIS — I4891 Unspecified atrial fibrillation: Secondary | ICD-10-CM | POA: Diagnosis not present

## 2016-12-22 DIAGNOSIS — Z79899 Other long term (current) drug therapy: Secondary | ICD-10-CM | POA: Diagnosis not present

## 2016-12-22 DIAGNOSIS — R7301 Impaired fasting glucose: Secondary | ICD-10-CM | POA: Diagnosis present

## 2016-12-22 DIAGNOSIS — R1013 Epigastric pain: Secondary | ICD-10-CM | POA: Diagnosis not present

## 2016-12-22 HISTORY — DX: Presence of aortocoronary bypass graft: Z95.1

## 2016-12-22 HISTORY — PX: ABDOMINAL AORTOGRAM: CATH118222

## 2016-12-22 HISTORY — PX: LEFT HEART CATH AND CORONARY ANGIOGRAPHY: CATH118249

## 2016-12-22 HISTORY — PX: IABP INSERTION: CATH118242

## 2016-12-22 LAB — PLATELET FUNCTION ASSAY: COLLAGEN / EPINEPHRINE: 160 s (ref 0–193)

## 2016-12-22 LAB — LIPID PANEL
CHOL/HDL RATIO: 2.5 ratio
CHOLESTEROL: 121 mg/dL (ref 0–200)
HDL: 48 mg/dL (ref >40)
LDL CALC: 47 mg/dL (ref 0–99)
TRIGLYCERIDES: 129 mg/dL (ref <150)
VLDL: 26 mg/dL (ref 0–40)

## 2016-12-22 LAB — GLUCOSE, CAPILLARY: Glucose-Capillary: 96 mg/dL (ref 65–99)

## 2016-12-22 LAB — APTT: aPTT: 30 seconds (ref 24–36)

## 2016-12-22 LAB — ECHOCARDIOGRAM COMPLETE
HEIGHTINCHES: 67 in
Weight: 2736 oz

## 2016-12-22 LAB — PLATELET INHIBITION P2Y12: Platelet Function  P2Y12: 236 [PRU] (ref 194–418)

## 2016-12-22 LAB — HEPARIN LEVEL (UNFRACTIONATED): HEPARIN UNFRACTIONATED: 0.38 [IU]/mL (ref 0.30–0.70)

## 2016-12-22 LAB — PROTIME-INR
INR: 0.96
Prothrombin Time: 12.7 seconds (ref 11.4–15.2)

## 2016-12-22 LAB — MRSA PCR SCREENING
MRSA BY PCR: NEGATIVE
MRSA by PCR: NEGATIVE

## 2016-12-22 LAB — TROPONIN I
Troponin I: 0.29 ng/mL (ref <0.03)
Troponin I: 0.44 ng/mL (ref <0.03)

## 2016-12-22 SURGERY — LEFT HEART CATH AND CORONARY ANGIOGRAPHY
Anesthesia: Moderate Sedation

## 2016-12-22 MED ORDER — NITROGLYCERIN IN D5W 200-5 MCG/ML-% IV SOLN
INTRAVENOUS | Status: AC | PRN
  Administered 2016-12-22: 5 ug/min via INTRAVENOUS

## 2016-12-22 MED ORDER — NITROGLYCERIN IN D5W 200-5 MCG/ML-% IV SOLN
0.0000 ug/min | INTRAVENOUS | Status: DC
  Administered 2016-12-22: 5 ug/min via INTRAVENOUS

## 2016-12-22 MED ORDER — SODIUM CHLORIDE 0.9% FLUSH: 3.0000 mL | Freq: Two times a day (BID) | INTRAVENOUS | Status: DC

## 2016-12-22 MED ORDER — ASPIRIN 81 MG PO CHEW: 81.0000 mg | CHEWABLE_TABLET | ORAL | Status: DC

## 2016-12-22 MED ORDER — ACETAMINOPHEN 325 MG PO TABS: 650.0000 mg | ORAL_TABLET | ORAL | Status: DC | PRN

## 2016-12-22 MED ORDER — HEPARIN (PORCINE) IN NACL 100-0.45 UNIT/ML-% IJ SOLN
800.0000 [IU]/h | INTRAMUSCULAR | Status: DC
  Administered 2016-12-22: 800 [IU]/h via INTRAVENOUS
  Filled 2016-12-22: qty 250

## 2016-12-22 MED ORDER — LOSARTAN POTASSIUM 50 MG PO TABS: 100.0000 mg | ORAL_TABLET | Freq: Every day | ORAL | Status: DC

## 2016-12-22 MED ORDER — LORAZEPAM 0.5 MG PO TABS: 0.5000 mg | ORAL_TABLET | Freq: Three times a day (TID) | ORAL | Status: DC | PRN

## 2016-12-22 MED ORDER — MORPHINE SULFATE (PF) 4 MG/ML IV SOLN: 2.0000 mg | INTRAVENOUS | Status: DC | PRN

## 2016-12-22 MED ORDER — LORAZEPAM 0.5 MG PO TABS
0.5000 mg | ORAL_TABLET | Freq: Once | ORAL | Status: AC
  Administered 2016-12-22: 0.5 mg via ORAL
  Filled 2016-12-22: qty 1

## 2016-12-22 MED ORDER — NITROGLYCERIN IN D5W 200-5 MCG/ML-% IV SOLN: 0.0000 ug/min | INTRAVENOUS | Status: DC

## 2016-12-22 MED ORDER — ATORVASTATIN CALCIUM 80 MG PO TABS: 80.0000 mg | ORAL_TABLET | Freq: Every day | ORAL | Status: DC

## 2016-12-22 MED ORDER — PROMETHAZINE HCL 25 MG/ML IJ SOLN: 12.5000 mg | Freq: Three times a day (TID) | INTRAMUSCULAR | Status: DC | PRN

## 2016-12-22 MED ORDER — ONDANSETRON HCL 4 MG/2ML IJ SOLN
4.0000 mg | Freq: Four times a day (QID) | INTRAMUSCULAR | Status: DC | PRN
  Administered 2016-12-23: 4 mg via INTRAVENOUS

## 2016-12-22 MED ORDER — NITROGLYCERIN 0.4 MG SL SUBL
SUBLINGUAL_TABLET | SUBLINGUAL | Status: AC
  Filled 2016-12-22: qty 1

## 2016-12-22 MED ORDER — MIDAZOLAM HCL 2 MG/2ML IJ SOLN
INTRAMUSCULAR | Status: DC | PRN
  Administered 2016-12-22 (×2): 0.5 mg via INTRAVENOUS

## 2016-12-22 MED ORDER — HYDROCODONE-ACETAMINOPHEN 5-325 MG PO TABS
1.0000 | ORAL_TABLET | ORAL | Status: DC | PRN
  Administered 2016-12-23: 2 via ORAL
  Filled 2016-12-22: qty 2

## 2016-12-22 MED ORDER — FAMOTIDINE IN NACL 20-0.9 MG/50ML-% IV SOLN
20.0000 mg | Freq: Two times a day (BID) | INTRAVENOUS | Status: DC
  Filled 2016-12-22 (×2): qty 50

## 2016-12-22 MED ORDER — SODIUM CHLORIDE 0.9% FLUSH: 3.0000 mL | INTRAVENOUS | Status: DC | PRN

## 2016-12-22 MED ORDER — SODIUM CHLORIDE 0.9 % IV SOLN
INTRAVENOUS | Status: DC
  Administered 2016-12-22: 14:00:00 via INTRAVENOUS

## 2016-12-22 MED ORDER — SODIUM CHLORIDE 0.9 % IV SOLN: 250.0000 mL | INTRAVENOUS | Status: DC | PRN

## 2016-12-22 MED ORDER — NITROGLYCERIN 0.4 MG SL SUBL
SUBLINGUAL_TABLET | SUBLINGUAL | Status: AC
  Administered 2016-12-22: 0.4 mg
  Filled 2016-12-22: qty 1

## 2016-12-22 MED ORDER — NITROGLYCERIN 0.4 MG SL SUBL: 0.4000 mg | SUBLINGUAL_TABLET | SUBLINGUAL | Status: DC | PRN

## 2016-12-22 MED ORDER — VERAPAMIL HCL 2.5 MG/ML IV SOLN
INTRAVENOUS | Status: AC
  Filled 2016-12-22: qty 2

## 2016-12-22 MED ORDER — SODIUM CHLORIDE 0.9 % WEIGHT BASED INFUSION: 1.0000 mL/kg/h | INTRAVENOUS | Status: DC

## 2016-12-22 MED ORDER — LORAZEPAM 0.5 MG PO TABS: 0.5000 mg | ORAL_TABLET | Freq: Every day | ORAL | Status: DC

## 2016-12-22 MED ORDER — SODIUM CHLORIDE 0.9 % WEIGHT BASED INFUSION: 3.0000 mL/kg/h | INTRAVENOUS | Status: DC

## 2016-12-22 MED ORDER — ASPIRIN EC 81 MG PO TBEC
81.0000 mg | DELAYED_RELEASE_TABLET | Freq: Every day | ORAL | Status: DC
Start: 2016-12-23 — End: 2016-12-22

## 2016-12-22 MED ORDER — ZOLPIDEM TARTRATE 5 MG PO TABS: 5.0000 mg | ORAL_TABLET | Freq: Every evening | ORAL | Status: DC | PRN

## 2016-12-22 MED ORDER — CLOPIDOGREL BISULFATE 75 MG PO TABS
300.0000 mg | ORAL_TABLET | Freq: Once | ORAL | Status: AC
  Administered 2016-12-22: 300 mg via ORAL
  Filled 2016-12-22: qty 4

## 2016-12-22 MED ORDER — HEPARIN BOLUS VIA INFUSION
4000.0000 [IU] | Freq: Once | INTRAVENOUS | Status: AC
  Administered 2016-12-22: 4000 [IU] via INTRAVENOUS
  Filled 2016-12-22: qty 4000

## 2016-12-22 MED ORDER — HEPARIN (PORCINE) IN NACL 100-0.45 UNIT/ML-% IJ SOLN: 800.0000 [IU]/h | INTRAMUSCULAR | Status: DC

## 2016-12-22 MED ORDER — METOPROLOL SUCCINATE ER 25 MG PO TB24: 75.0000 mg | ORAL_TABLET | Freq: Every day | ORAL | Status: DC

## 2016-12-22 MED ORDER — ASPIRIN 81 MG PO CHEW
CHEWABLE_TABLET | ORAL | Status: AC
  Filled 2016-12-22: qty 1

## 2016-12-22 MED ORDER — ATORVASTATIN CALCIUM 20 MG PO TABS: 40.0000 mg | ORAL_TABLET | Freq: Every day | ORAL | Status: DC

## 2016-12-22 MED ORDER — HEPARIN (PORCINE) IN NACL 100-0.45 UNIT/ML-% IJ SOLN
900.0000 [IU]/h | INTRAMUSCULAR | Status: DC
  Administered 2016-12-22: 900 [IU]/h via INTRAVENOUS
  Filled 2016-12-22: qty 250

## 2016-12-22 MED ORDER — MORPHINE SULFATE (PF) 2 MG/ML IV SOLN: 2.0000 mg | INTRAVENOUS | 0 refills | Status: DC | PRN

## 2016-12-22 MED ORDER — NITROGLYCERIN IN D5W 200-5 MCG/ML-% IV SOLN
INTRAVENOUS | Status: AC
  Filled 2016-12-22: qty 250

## 2016-12-22 MED ORDER — LIDOCAINE HCL (PF) 1 % IJ SOLN
INTRAMUSCULAR | Status: AC
  Filled 2016-12-22: qty 30

## 2016-12-22 MED ORDER — METOPROLOL SUCCINATE ER 50 MG PO TB24: 50.0000 mg | ORAL_TABLET | Freq: Every day | ORAL | Status: DC

## 2016-12-22 MED ORDER — HYDROCODONE-ACETAMINOPHEN 5-325 MG PO TABS: 1.0000 | ORAL_TABLET | ORAL | 0 refills | Status: DC | PRN

## 2016-12-22 MED ORDER — PANTOPRAZOLE SODIUM 40 MG PO TBEC: 40.0000 mg | DELAYED_RELEASE_TABLET | Freq: Every day | ORAL | Status: DC

## 2016-12-22 MED ORDER — HEPARIN SODIUM (PORCINE) 1000 UNIT/ML IJ SOLN
INTRAMUSCULAR | Status: DC | PRN
  Administered 2016-12-22: 3500 [IU] via INTRAVENOUS

## 2016-12-22 MED ORDER — LORAZEPAM 0.5 MG PO TABS
0.5000 mg | ORAL_TABLET | Freq: Three times a day (TID) | ORAL | Status: DC | PRN
  Administered 2016-12-22 – 2016-12-23 (×2): 0.5 mg via ORAL
  Filled 2016-12-22 (×2): qty 1

## 2016-12-22 MED ORDER — FENTANYL CITRATE (PF) 100 MCG/2ML IJ SOLN
INTRAMUSCULAR | Status: DC | PRN
  Administered 2016-12-22 (×2): 25 ug via INTRAVENOUS

## 2016-12-22 MED ORDER — NITROGLYCERIN 0.4 MG SL SUBL
SUBLINGUAL_TABLET | SUBLINGUAL | Status: DC | PRN
  Administered 2016-12-22: .4 mg via SUBLINGUAL

## 2016-12-22 MED ORDER — HEPARIN (PORCINE) IN NACL 2-0.9 UNIT/ML-% IJ SOLN
INTRAMUSCULAR | Status: AC
  Filled 2016-12-22: qty 500

## 2016-12-22 MED ORDER — FENTANYL CITRATE (PF) 100 MCG/2ML IJ SOLN
INTRAMUSCULAR | Status: AC
  Filled 2016-12-22: qty 2

## 2016-12-22 MED ORDER — POLYETHYLENE GLYCOL 3350 17 G PO PACK: 17.0000 g | PACK | Freq: Every day | ORAL | Status: DC | PRN

## 2016-12-22 MED ORDER — ADULT MULTIVITAMIN W/MINERALS CH: 1.0000 | ORAL_TABLET | Freq: Every day | ORAL | Status: DC

## 2016-12-22 MED ORDER — ASPIRIN EC 81 MG PO TBEC: 81.0000 mg | DELAYED_RELEASE_TABLET | Freq: Every day | ORAL | Status: DC

## 2016-12-22 MED ORDER — CLOPIDOGREL BISULFATE 75 MG PO TABS: 75.0000 mg | ORAL_TABLET | Freq: Every day | ORAL | Status: DC

## 2016-12-22 MED ORDER — HEPARIN SODIUM (PORCINE) 1000 UNIT/ML IJ SOLN
INTRAMUSCULAR | Status: AC
  Filled 2016-12-22: qty 1

## 2016-12-22 MED ORDER — ATORVASTATIN CALCIUM 20 MG PO TABS: 80.0000 mg | ORAL_TABLET | Freq: Every day | ORAL | Status: DC

## 2016-12-22 MED ORDER — METOPROLOL SUCCINATE ER 50 MG PO TB24: 75.0000 mg | ORAL_TABLET | Freq: Every day | ORAL | Status: DC

## 2016-12-22 MED ORDER — ORAL CARE MOUTH RINSE
15.0000 mL | Freq: Two times a day (BID) | OROMUCOSAL | Status: DC
  Administered 2016-12-22 – 2016-12-23 (×2): 15 mL via OROMUCOSAL

## 2016-12-22 MED ORDER — FUROSEMIDE 20 MG PO TABS: 20.0000 mg | ORAL_TABLET | Freq: Every day | ORAL | Status: DC

## 2016-12-22 MED ORDER — ONDANSETRON HCL 4 MG/2ML IJ SOLN: 4.0000 mg | Freq: Four times a day (QID) | INTRAMUSCULAR | Status: DC | PRN

## 2016-12-22 MED ORDER — VERAPAMIL HCL 2.5 MG/ML IV SOLN
INTRAVENOUS | Status: DC | PRN
  Administered 2016-12-22: 2.5 mg via INTRA_ARTERIAL

## 2016-12-22 MED ORDER — MIDAZOLAM HCL 2 MG/2ML IJ SOLN
INTRAMUSCULAR | Status: AC
  Filled 2016-12-22: qty 2

## 2016-12-22 MED ORDER — ALPRAZOLAM 0.25 MG PO TABS: 0.2500 mg | ORAL_TABLET | Freq: Two times a day (BID) | ORAL | Status: DC | PRN

## 2016-12-22 MED ORDER — ONDANSETRON HCL 4 MG PO TABS: 4.0000 mg | ORAL_TABLET | Freq: Four times a day (QID) | ORAL | 0 refills | Status: DC | PRN

## 2016-12-22 MED ORDER — IOPAMIDOL (ISOVUE-300) INJECTION 61%
INTRAVENOUS | Status: DC | PRN
  Administered 2016-12-22: 75 mL via INTRAVENOUS

## 2016-12-22 SURGICAL SUPPLY — 14 items
CATH 5FR PIGTAIL DIAGNOSTIC (CATHETERS) ×3 IMPLANT
CATH IAB 7FR 40ML (CATHETERS) ×3 IMPLANT
CATH OPTITORQUE JACKY 4.0 5F (CATHETERS) ×3 IMPLANT
DEVICE RAD TR BAND REGULAR (VASCULAR PRODUCTS) ×3 IMPLANT
DEVICE SECURE STATLOCK IABP (MISCELLANEOUS) ×6 IMPLANT
GLIDESHEATH SLEND SS 6F .021 (SHEATH) ×3 IMPLANT
KIT MANI 3VAL PERCEP (MISCELLANEOUS) ×3 IMPLANT
KIT TRANSPAC II SGL 4260605 (MISCELLANEOUS) ×3 IMPLANT
PACK CARDIAC CATH (CUSTOM PROCEDURE TRAY) ×3 IMPLANT
SET INTRO CAPELLA COAXIAL (SET/KITS/TRAYS/PACK) ×3 IMPLANT
SHEATH AVANTI 5FR X 11CM (SHEATH) ×3 IMPLANT
SUT SILK 0 FSL (SUTURE) ×3 IMPLANT
WIRE HITORQ VERSACORE ST 145CM (WIRE) ×6 IMPLANT
WIRE ROSEN-J .035X260CM (WIRE) ×3 IMPLANT

## 2016-12-22 NOTE — Consult Note (Signed)
Cardiology Consult    Patient ID: [name] MRN: [id number], DOB/AGE: 1930-04-23   Admit date: 12/21/2016 Date of Consult: 12/22/2016  Primary Physician: Lajean Manes, MD Primary Cardiologist: Ida Rogue, MD Requesting Provider: R. Wieting, MD  Patient Profile    [name] is a 83 y.o. female with a history of CAD s/p prior LAD and LCX interventions, HTN, HL, TIA, chronic diarrhea, depression, anxiety, and atypical lung carcinoid, who is being seen today for the evaluation of chest pain/NSTEMI at the request of Dr. Leslye Peer.  Past Medical History   Past Medical History:  Diagnosis Date  . Anxiety   . Atypical carcinoid lung tumor ([place])   . CAD (coronary artery disease)    a. 1986: s/p PTCA of bifurcation lesion in LAD;  b. 2001: s/p BMS to LAD & LCX;  c. 04/2006 Cath: diff CAD w/o critical narrowing, nl EF;  d. 12/2014 MV: inflat ST dep, mild basal inf ischemia, EF 55-65%-->Low risk.  . Cancer ([place])   . Carotid arterial disease ([place])    a. 08/2015 Carotid U/S: < 39% bilat ICA stenosis.  . Carotid disease, bilateral ([place])    a. RICA 0-53%, LICA 97-67%  . Cataracts, bilateral   . Cholelithiases   . Chronic diarrhea   . Depression   . Gilbert's syndrome   . History of echocardiogram    a. 06/2010 Echo: EF 60-65%, mild AI/MR; b. 10/2015 Echo: EF 60-65%, no rwma, mild MR, nl RV fxn.  Marland Kitchen HTN (hypertension)   . Hyperlipidemia   . Impaired fasting blood sugar   . Keloid    mid sternum  . Macular degeneration   . Osteoarthritis   . Positional vertigo    benign  . Symptoms, such as flushing, sleeplessness, headache, lack of concentration, associated with the menopause   . TIA (transient ischemic attack)     Past Surgical History:  Procedure Laterality Date  . carcinoid     removed from lung, [place]  . LEFT HEART CATHETERIZATION WITH CORONARY ANGIOGRAM N/A 04/27/2012   Procedure: LEFT HEART CATHETERIZATION WITH CORONARY ANGIOGRAM;  Surgeon: Peter M Martinique, MD;   Location: [facility] CATH LAB;  Service: Cardiovascular;  Laterality: N/A;  . pci  1986  . PCI of LAD  2001   with non des lad   . pci of mid cfx     with non des and cutting balloon pci of av circumflex     Allergies  Allergies  Allergen Reactions  . Biaxin [Clarithromycin]     depressed  . Celecoxib Other (See Comments)    Reaction: Unknown  . Ciprofloxacin   . Codeine Other (See Comments)    Reaction: Unknown  . Hydromorphone   . Lisinopril     cough  . Macrobid [employer] Macro] Other (See Comments)    Reaction: Unknown  . Oxycodone Hcl Other (See Comments)    Reaction: Unknown  . Penicillins Other (See Comments)    Reaction: Unknown  . Prednisone   . Sertraline Hcl Other (See Comments)    Reaction: Unknown    History of Present Illness    83 y/o ? with the above complex past medical history including coronary artery disease status post PTCA of the LAD in 1986 followed by bare-metal stenting of the LAD and circumflex in 2001.  Other history includes hypertension, hyperlipidemia, TIA, chronic diarrhea, depression, anxiety, and atypical lung carcinoid.  Most Recent Ischemic Testing Took Pl. in December 2016, at which time a Myoview  was performed and showed mild basal inferior ischemia.  The area affected was felt to be small and thus, this was felt to be a low risk study.  More recently, echocardiography in October 2017 showed normal LV function, with an EF of 60-65%.  Patient reports a long history of intermittent abdominal discomfort and diarrhea.  Over the past 6 months, this seems to be more frequent and has also included retrosternal chest discomfort and sometimes mid scapular back pain.  Symptoms typically occur on a daily basis at rest and generally last less than 15-20 minutes.  Sometimes symptoms can be provoked by certain foods but she does not think that there is a clear connection between eating and chest/abdominal discomfort.  At times, she has taken sublingual  nitroglycerin with relief of symptoms.  On the afternoon of December 10, she took a nap but then was awakened by severe back pain between her shoulder blades, chest and abdominal pain, and dyspnea.  Dyspnea was a new symptom for her.  EMS was called and she was taken to the [facility] emergency department.  Here, ECG nonacute.  Initial troponin was normal but has subsequently risen to 0.29 following admission.  She did have recurrent nitrate responsive chest pain during our interview.  Inpatient Medications    . aspirin      . [MAR Hold] aspirin  81 mg Oral Daily  . [MAR Hold] aspirin  81 mg Oral Pre-Cath  . [MAR Hold] atorvastatin  10 mg Oral Daily  . [MAR Hold] clopidogrel  75 mg Oral Daily  . [MAR Hold] LORazepam  0.5 mg Oral QHS  . [MAR Hold] losartan  100 mg Oral Daily  . [MAR Hold] metoprolol succinate  50 mg Oral Daily  . [MAR Hold] pantoprazole (PROTONIX) IV  40 mg Intravenous Q24H  . [MAR Hold] sodium chloride flush  3 mL Intravenous Q12H  . [MAR Hold] sodium chloride flush  3 mL Intravenous Q12H    Family History    Family History  Problem Relation Age of Onset  . Diabetes Mellitus I Mother   . CAD Mother   . CAD Father   . Diabetes Mellitus I Sister   . Diabetes Mellitus I Brother   . Diabetes Mellitus I Sister     Social History    Social History   Socioeconomic History  . Marital status: Divorced    Spouse name: Not on file  . Number of children: Not on file  . Years of education: Not on file  . Highest education level: Not on file  Social Needs  . Financial resource strain: Not on file  . Food insecurity - worry: Not on file  . Food insecurity - inability: Not on file  . Transportation needs - medical: Not on file  . Transportation needs - non-medical: Not on file  Occupational History  . Occupation: retired  Tobacco Use  . Smoking status: Never Smoker  . Smokeless tobacco: Never Used  Substance and Sexual Activity  . Alcohol use: No  . Drug use: No  .  Sexual activity: Not on file  Other Topics Concern  . Not on file  Social History Narrative  . Not on file     Review of Systems    General:  No chills, fever, night sweats or weight changes.  Cardiovascular:  +++ chest pain, +++ assoc dyspnea, no edema, orthopnea, palpitations, paroxysmal nocturnal dyspnea. Dermatological: No rash, lesions/masses Respiratory: No cough, +++ dyspnea Urologic: No hematuria, dysuria Abdominal:  No nausea, vomiting, +++ diarrhea, no bright red blood per rectum, melena, or hematemesis Neurologic:  No visual changes, wkns, changes in mental status. All other systems reviewed and are otherwise negative except as noted above.  Physical Exam    Blood pressure (!) 169/71, pulse 76, temperature 98.1 F (36.7 C), temperature source Oral, resp. rate 20, height 5\' 7"  (1.702 m), weight 171 lb (77.6 kg), SpO2 100 %.  General: Pleasant, NAD Psych: Normal affect. Neuro: Alert and oriented X 3. Moves all extremities spontaneously. HEENT: Normal  Neck: Supple without bruits or JVD. Lungs:  Resp regular and unlabored, CTA. Heart: RRR no s3, s4, or murmurs. Abdomen: Soft, non-tender, non-distended, BS + x 4.  Extremities: No clubbing, cyanosis or edema. DP/PT/Radials 2+ and equal bilaterally.  Labs     Recent Labs    12/21/16 1812 12/21/16 2251 12/22/16 0437  TROPONINI <0.03 0.04* 0.29*   Lab Results  Component Value Date   WBC 6.5 12/21/2016   HGB 12.9 12/21/2016   HCT 38.6 12/21/2016   MCV 87.7 12/21/2016   PLT 178 12/21/2016    Recent Labs  Lab 12/21/16 1812  NA 137  K 4.0  CL 106  CO2 24  BUN 13  CREATININE 0.85  CALCIUM 9.0  PROT 6.7  BILITOT 1.6*  ALKPHOS 104  ALT 17  AST 29  GLUCOSE 147*   Lab Results  Component Value Date   CHOL 121 12/22/2016   HDL 48 12/22/2016   LDLCALC 47 12/22/2016   TRIG 129 12/22/2016     Radiology Studies    Ct Abdomen Pelvis W Contrast  Addendum Date: 12/21/2016   ADDENDUM REPORT: 12/21/2016  19:52 ADDENDUM: This is an addendum to the original report. The pneumobilia identified on today's study was present on study from 07/08/2016 but is a new finding when compared with 10/29/2011. Therefore, this may reflect a benign process such as pneumobilia due to instrumentation. Further. in the absence of signs or symptoms of infection this makes the original consideration for cholangitis less likely. The filling defects discussed in today's report however appear to be a new finding from 10/29/2011. I cannot confidently confirm the these were present on study from 07/08/2016. And MRCP may be helpful to confirm presence of choledocholithiasis. These findings were discussed with Dr. Merlyn Lot at 12/21/2016, 7:51 p.m. Electronically Signed   By: Kerby Moors M.D.   On: 12/21/2016 19:52   Result Date: 12/21/2016 CLINICAL DATA:  Abdominal pain and fever. EXAM: CT ABDOMEN AND PELVIS WITH CONTRAST TECHNIQUE: Multidetector CT imaging of the abdomen and pelvis was performed using the standard protocol following bolus administration of intravenous contrast. CONTRAST:  131mL ISOVUE-300 IOPAMIDOL (ISOVUE-300) INJECTION 61% COMPARISON:  07/08/2016 FINDINGS: Lower chest: There is a small right pleural effusion. Unchanged from previous exam. Hepatobiliary: No focal liver abnormality. Pneumobilia is identified status post cholecystectomy. This is new when compared with 07/08/2016 and 10/29/2011. Soft tissue attenuating filling defects within the common bile duct are identified which appear new from previous exam. Pancreas: Unremarkable. No pancreatic ductal dilatation or surrounding inflammatory changes. Spleen: Normal in size without focal abnormality. Adrenals/Urinary Tract: The adrenal glands appear normal. Unremarkable appearance of the kidneys. No mass or hydronephrosis. Urinary bladder is unremarkable. Stomach/Bowel: The stomach is normal. The small bowel loops have a normal course and caliber without  obstruction. The appendix is not visualized. No secondary signs of acute appendicitis. Vascular/Lymphatic: Aortic atherosclerosis. Branch vessel aortic atherosclerotic calcifications noted. No aneurysm. No adenopathy within the abdomen or  pelvis. Reproductive: Status post hysterectomy. No adnexal masses. Other: No free fluid or fluid collections. Musculoskeletal: Degenerative disc disease noted within the lumbar spine. IMPRESSION: 1. Interval development of pneumobilia. There also several soft tissue attenuating filling defects within the common bile duct which may represent sequelae of inflammation/ infection or choledocholithiasis, this is new from 07/08/2016. In the setting of suspected infection and elevated bilirubin cholangitis cannot be excluded. 2. No intra-abdominal abscess identified. 3.  Aortic Atherosclerosis (ICD10-I70.0). 4. Chronic right pleural effusion. Electronically Signed: By: Kerby Moors M.D. On: 12/21/2016 19:24   Dg Chest Portable 1 View  Result Date: 12/21/2016 CLINICAL DATA:  Epigastric pain EXAM: PORTABLE CHEST 1 VIEW COMPARISON:  07/08/2016 FINDINGS: Cardiac shadow is stable. Aortic calcifications are again seen. The lungs are well aerated. Postsurgical changes are noted on the right with mild volume loss. No acute infiltrate or sizable effusion is seen. No bony abnormality is noted. IMPRESSION: Postoperative changes on the right.  No acute abnormality seen. Electronically Signed   By: Inez Catalina M.D.   On: 12/21/2016 18:17    ECG & Cardiac Imaging    RSR, 70 PVCs, non-specific ST changes.  Assessment & Plan    1.  NSTEMI/CAD: Status post prior PTCA to LAD in 1986 followed by bare-metal stenting of the LAD and circumflex in 2001.  Low risk Myoview in December 2016 with normal LV function by echo in October 2017.  Admitted December 11 with a 69-month history of intermittent abdominal and chest discomfort.  She had a severe recurrent episode on December 10, associated with  pain between her shoulder blades and dyspnea.  Initial troponin was normal however, this has subsequently risen to 0.29.  ECG nonacute.  Frequent PVCs on telemetry, though she has a history of this.  She had recurrent nitrate responsive chest pain during our interview.  Plan on diagnostic catheterization this morning.  The patient understands that risks include but are not limited to stroke (1 in 1000), death (1 in 32), kidney failure [usually temporary] (1 in 500), bleeding (1 in 200), allergic reaction [possibly serious] (1 in 200), and agrees to proceed.  Continue aspirin, statin, Plavix, beta-blocker, ARB, and heparin therapy.  Further recommendations following diagnostic catheterization.  2.  Essential hypertension: Blood pressure elevated this morning and trended in the 140s-160s yesterday.  Heart rates in the 70s-80s with frequent PVCs.  Will titrate beta-blocker.  3.  Hyperlipidemia: LDL 47.  Continue statin therapy.  4.  Abdominal pain: Patient reports a long history of abdominal discomfort and diarrhea.  CT of the abdomen yesterday showed stable pneumobilia since CT in June 2018.  New finding of soft tissue attenuating filling defects within the common bile duct.  Per internal medicine.   Signed, Murray Hodgkins, NP 12/22/2016, 11:08 AM  For questions or updates, please contact   Please consult [email or web address] for contact info under Cardiology/STEMI.

## 2016-12-22 NOTE — OR Nursing (Deleted)
[name], rt from vir staff in at bedside to evaluate left groin site

## 2016-12-22 NOTE — H&P (View-Only) (Signed)
Cardiology Consult    Patient ID: [name] MRN: [id number], DOB/AGE: 81-Aug-1932   Admit date: 12/21/2016 Date of Consult: 12/22/2016  Primary Physician: Lajean Manes, MD Primary Cardiologist: Ida Rogue, MD Requesting Provider: R. Wieting, MD  Patient Profile    [name] is a 83 y.o. female with a history of CAD s/p prior LAD and LCX interventions, HTN, HL, TIA, chronic diarrhea, depression, anxiety, and atypical lung carcinoid, who is being seen today for the evaluation of chest pain/NSTEMI at the request of Dr. Leslye Peer.  Past Medical History   Past Medical History:  Diagnosis Date  . Anxiety   . Atypical carcinoid lung tumor ([place])   . CAD (coronary artery disease)    a. 1986: s/p PTCA of bifurcation lesion in LAD;  b. 2001: s/p BMS to LAD & LCX;  c. 04/2006 Cath: diff CAD w/o critical narrowing, nl EF;  d. 12/2014 MV: inflat ST dep, mild basal inf ischemia, EF 55-65%-->Low risk.  . Cancer ([place])   . Carotid arterial disease ([place])    a. 08/2015 Carotid U/S: < 39% bilat ICA stenosis.  . Carotid disease, bilateral ([place])    a. RICA 1-69%, LICA 67-89%  . Cataracts, bilateral   . Cholelithiases   . Chronic diarrhea   . Depression   . Gilbert's syndrome   . History of echocardiogram    a. 06/2010 Echo: EF 60-65%, mild AI/MR; b. 10/2015 Echo: EF 60-65%, no rwma, mild MR, nl RV fxn.  Mar[place] Kitchen HTN (hypertension)   . Hyperlipidemia   . Impaired fasting blood sugar   . Keloid    mid sternum  . Macular degeneration   . Osteoarthritis   . Positional vertigo    benign  . Symptoms, such as flushing, sleeplessness, headache, lack of concentration, associated with the menopause   . TIA (transient ischemic attack)     Past Surgical History:  Procedure Laterality Date  . carcinoid     removed from lung, [place]  . LEFT HEART CATHETERIZATION WITH CORONARY ANGIOGRAM N/A 04/27/2012   Procedure: LEFT HEART CATHETERIZATION WITH CORONARY ANGIOGRAM;  Surgeon: Peter M Martinique, MD;   Location: [facility] CATH LAB;  Service: Cardiovascular;  Laterality: N/A;  . pci  1986  . PCI of LAD  2001   with non des lad   . pci of mid cfx     with non des and cutting balloon pci of av circumflex     Allergies  Allergies  Allergen Reactions  . Biaxin [Clarithromycin]     depressed  . Celecoxib Other (See Comments)    Reaction: Unknown  . Ciprofloxacin   . Codeine Other (See Comments)    Reaction: Unknown  . Hydromorphone   . Lisinopril     cough  . Macrobid [employer] Macro] Other (See Comments)    Reaction: Unknown  . Oxycodone Hcl Other (See Comments)    Reaction: Unknown  . Penicillins Other (See Comments)    Reaction: Unknown  . Prednisone   . Sertraline Hcl Other (See Comments)    Reaction: Unknown    History of Present Illness    83 y/o ? with the above complex past medical history including coronary artery disease status post PTCA of the LAD in 1986 followed by bare-metal stenting of the LAD and circumflex in 2001.  Other history includes hypertension, hyperlipidemia, TIA, chronic diarrhea, depression, anxiety, and atypical lung carcinoid.  Most Recent Ischemic Testing Took Pl. in December 2016, at which time a Myoview  was performed and showed mild basal inferior ischemia.  The area affected was felt to be small and thus, this was felt to be a low risk study.  More recently, echocardiography in October 2017 showed normal LV function, with an EF of 60-65%.  Patient reports a long history of intermittent abdominal discomfort and diarrhea.  Over the past 6 months, this seems to be more frequent and has also included retrosternal chest discomfort and sometimes mid scapular back pain.  Symptoms typically occur on a daily basis at rest and generally last less than 15-20 minutes.  Sometimes symptoms can be provoked by certain foods but she does not think that there is a clear connection between eating and chest/abdominal discomfort.  At times, she has taken sublingual  nitroglycerin with relief of symptoms.  On the afternoon of December 10, she took a nap but then was awakened by severe back pain between her shoulder blades, chest and abdominal pain, and dyspnea.  Dyspnea was a new symptom for her.  EMS was called and she was taken to the [facility] emergency department.  Here, ECG nonacute.  Initial troponin was normal but has subsequently risen to 0.29 following admission.  She did have recurrent nitrate responsive chest pain during our interview.  Inpatient Medications    . aspirin      . [MAR Hold] aspirin  81 mg Oral Daily  . [MAR Hold] aspirin  81 mg Oral Pre-Cath  . [MAR Hold] atorvastatin  10 mg Oral Daily  . [MAR Hold] clopidogrel  75 mg Oral Daily  . [MAR Hold] LORazepam  0.5 mg Oral QHS  . [MAR Hold] losartan  100 mg Oral Daily  . [MAR Hold] metoprolol succinate  50 mg Oral Daily  . [MAR Hold] pantoprazole (PROTONIX) IV  40 mg Intravenous Q24H  . [MAR Hold] sodium chloride flush  3 mL Intravenous Q12H  . [MAR Hold] sodium chloride flush  3 mL Intravenous Q12H    Family History    Family History  Problem Relation Age of Onset  . Diabetes Mellitus I Mother   . CAD Mother   . CAD Father   . Diabetes Mellitus I Sister   . Diabetes Mellitus I Brother   . Diabetes Mellitus I Sister     Social History    Social History   Socioeconomic History  . Marital status: Divorced    Spouse name: Not on file  . Number of children: Not on file  . Years of education: Not on file  . Highest education level: Not on file  Social Needs  . Financial resource strain: Not on file  . Food insecurity - worry: Not on file  . Food insecurity - inability: Not on file  . Transportation needs - medical: Not on file  . Transportation needs - non-medical: Not on file  Occupational History  . Occupation: retired  Tobacco Use  . Smoking status: Never Smoker  . Smokeless tobacco: Never Used  Substance and Sexual Activity  . Alcohol use: No  . Drug use: No  .  Sexual activity: Not on file  Other Topics Concern  . Not on file  Social History Narrative  . Not on file     Review of Systems    General:  No chills, fever, night sweats or weight changes.  Cardiovascular:  +++ chest pain, +++ assoc dyspnea, no edema, orthopnea, palpitations, paroxysmal nocturnal dyspnea. Dermatological: No rash, lesions/masses Respiratory: No cough, +++ dyspnea Urologic: No hematuria, dysuria Abdominal:  No nausea, vomiting, +++ diarrhea, no bright red blood per rectum, melena, or hematemesis Neurologic:  No visual changes, wkns, changes in mental status. All other systems reviewed and are otherwise negative except as noted above.  Physical Exam    Blood pressure (!) 169/71, pulse 76, temperature 98.1 F (36.7 C), temperature source Oral, resp. rate 20, height 5\' 7"  (1.702 m), weight 171 lb (77.6 kg), SpO2 100 %.  General: Pleasant, NAD Psych: Normal affect. Neuro: Alert and oriented X 3. Moves all extremities spontaneously. HEENT: Normal  Neck: Supple without bruits or JVD. Lungs:  Resp regular and unlabored, CTA. Heart: RRR no s3, s4, or murmurs. Abdomen: Soft, non-tender, non-distended, BS + x 4.  Extremities: No clubbing, cyanosis or edema. DP/PT/Radials 2+ and equal bilaterally.  Labs     Recent Labs    12/21/16 1812 12/21/16 2251 12/22/16 0437  TROPONINI <0.03 0.04* 0.29*   Lab Results  Component Value Date   WBC 6.5 12/21/2016   HGB 12.9 12/21/2016   HCT 38.6 12/21/2016   MCV 87.7 12/21/2016   PLT 178 12/21/2016    Recent Labs  Lab 12/21/16 1812  NA 137  K 4.0  CL 106  CO2 24  BUN 13  CREATININE 0.85  CALCIUM 9.0  PROT 6.7  BILITOT 1.6*  ALKPHOS 104  ALT 17  AST 29  GLUCOSE 147*   Lab Results  Component Value Date   CHOL 121 12/22/2016   HDL 48 12/22/2016   LDLCALC 47 12/22/2016   TRIG 129 12/22/2016     Radiology Studies    Ct Abdomen Pelvis W Contrast  Addendum Date: 12/21/2016   ADDENDUM REPORT: 12/21/2016  19:52 ADDENDUM: This is an addendum to the original report. The pneumobilia identified on today's study was present on study from 07/08/2016 but is a new finding when compared with 10/29/2011. Therefore, this may reflect a benign process such as pneumobilia due to instrumentation. Further. in the absence of signs or symptoms of infection this makes the original consideration for cholangitis less likely. The filling defects discussed in today's report however appear to be a new finding from 10/29/2011. I cannot confidently confirm the these were present on study from 07/08/2016. And MRCP may be helpful to confirm presence of choledocholithiasis. These findings were discussed with Dr. Merlyn Lot at 12/21/2016, 7:51 p.m. Electronically Signed   By: Kerby Moors M.D.   On: 12/21/2016 19:52   Result Date: 12/21/2016 CLINICAL DATA:  Abdominal pain and fever. EXAM: CT ABDOMEN AND PELVIS WITH CONTRAST TECHNIQUE: Multidetector CT imaging of the abdomen and pelvis was performed using the standard protocol following bolus administration of intravenous contrast. CONTRAST:  142mL ISOVUE-300 IOPAMIDOL (ISOVUE-300) INJECTION 61% COMPARISON:  07/08/2016 FINDINGS: Lower chest: There is a small right pleural effusion. Unchanged from previous exam. Hepatobiliary: No focal liver abnormality. Pneumobilia is identified status post cholecystectomy. This is new when compared with 07/08/2016 and 10/29/2011. Soft tissue attenuating filling defects within the common bile duct are identified which appear new from previous exam. Pancreas: Unremarkable. No pancreatic ductal dilatation or surrounding inflammatory changes. Spleen: Normal in size without focal abnormality. Adrenals/Urinary Tract: The adrenal glands appear normal. Unremarkable appearance of the kidneys. No mass or hydronephrosis. Urinary bladder is unremarkable. Stomach/Bowel: The stomach is normal. The small bowel loops have a normal course and caliber without  obstruction. The appendix is not visualized. No secondary signs of acute appendicitis. Vascular/Lymphatic: Aortic atherosclerosis. Branch vessel aortic atherosclerotic calcifications noted. No aneurysm. No adenopathy within the abdomen or  pelvis. Reproductive: Status post hysterectomy. No adnexal masses. Other: No free fluid or fluid collections. Musculoskeletal: Degenerative disc disease noted within the lumbar spine. IMPRESSION: 1. Interval development of pneumobilia. There also several soft tissue attenuating filling defects within the common bile duct which may represent sequelae of inflammation/ infection or choledocholithiasis, this is new from 07/08/2016. In the setting of suspected infection and elevated bilirubin cholangitis cannot be excluded. 2. No intra-abdominal abscess identified. 3.  Aortic Atherosclerosis (ICD10-I70.0). 4. Chronic right pleural effusion. Electronically Signed: By: Kerby Moors M.D. On: 12/21/2016 19:24   Dg Chest Portable 1 View  Result Date: 12/21/2016 CLINICAL DATA:  Epigastric pain EXAM: PORTABLE CHEST 1 VIEW COMPARISON:  07/08/2016 FINDINGS: Cardiac shadow is stable. Aortic calcifications are again seen. The lungs are well aerated. Postsurgical changes are noted on the right with mild volume loss. No acute infiltrate or sizable effusion is seen. No bony abnormality is noted. IMPRESSION: Postoperative changes on the right.  No acute abnormality seen. Electronically Signed   By: Inez Catalina M.D.   On: 12/21/2016 18:17    ECG & Cardiac Imaging    RSR, 70 PVCs, non-specific ST changes.  Assessment & Plan    1.  NSTEMI/CAD: Status post prior PTCA to LAD in 1986 followed by bare-metal stenting of the LAD and circumflex in 2001.  Low risk Myoview in December 2016 with normal LV function by echo in October 2017.  Admitted December 11 with a 12-month history of intermittent abdominal and chest discomfort.  She had a severe recurrent episode on December 10, associated with  pain between her shoulder blades and dyspnea.  Initial troponin was normal however, this has subsequently risen to 0.29.  ECG nonacute.  Frequent PVCs on telemetry, though she has a history of this.  She had recurrent nitrate responsive chest pain during our interview.  Plan on diagnostic catheterization this morning.  The patient understands that risks include but are not limited to stroke (1 in 1000), death (1 in 31), kidney failure [usually temporary] (1 in 500), bleeding (1 in 200), allergic reaction [possibly serious] (1 in 200), and agrees to proceed.  Continue aspirin, statin, Plavix, beta-blocker, ARB, and heparin therapy.  Further recommendations following diagnostic catheterization.  2.  Essential hypertension: Blood pressure elevated this morning and trended in the 140s-160s yesterday.  Heart rates in the 70s-80s with frequent PVCs.  Will titrate beta-blocker.  3.  Hyperlipidemia: LDL 47.  Continue statin therapy.  4.  Abdominal pain: Patient reports a long history of abdominal discomfort and diarrhea.  CT of the abdomen yesterday showed stable pneumobilia since CT in June 2018.  New finding of soft tissue attenuating filling defects within the common bile duct.  Per internal medicine.   Signed, Murray Hodgkins, NP 12/22/2016, 11:08 AM  For questions or updates, please contact   Please consult [email or web address] for contact info under Cardiology/STEMI.

## 2016-12-22 NOTE — Discharge Summary (Signed)
[facility] [place] NAME: [name]    MR#:  [id number]  DATE OF BIRTH:  [birth year]  DATE OF ADMISSION:  12/21/2016 ADMITTING PHYSICIAN: Gorden Harms, MD  DATE OF DISCHARGE: 12/22/2016  PRIMARY CARE PHYSICIAN: Lajean Manes, MD    ADMISSION DIAGNOSIS:  Epigastric pain [R10.13] Chest pain, unspecified type [R07.9]  DISCHARGE DIAGNOSIS:  Active Problems:   Chest pain   Non-ST elevation (NSTEMI) myocardial infarction [facility])   SECONDARY DIAGNOSIS:   Past Medical History:  Diagnosis Date  . Anxiety   . Atypical carcinoid lung tumor ([place])   . CAD (coronary artery disease)    a. 1986: s/p PTCA of bifurcation lesion in LAD;  b. 2001: s/p BMS to LAD & LCX;  c. 04/2006 Cath: diff CAD w/o critical narrowing, nl EF;  d. 12/2014 MV: inflat ST dep, mild basal inf ischemia, EF 55-65%-->Low risk.  . Cancer ([place])   . Carotid arterial disease ([place])    a. 08/2015 Carotid U/S: < 39% bilat ICA stenosis.  . Carotid disease, bilateral ([place])    a. RICA 9-89%, LICA 21-19%  . Cataracts, bilateral   . Cholelithiases   . Chronic diarrhea   . Depression   . Gilbert's syndrome   . History of echocardiogram    a. 06/2010 Echo: EF 60-65%, mild AI/MR; b. 10/2015 Echo: EF 60-65%, no rwma, mild MR, nl RV fxn.  Marland Kitchen HTN (hypertension)   . Hyperlipidemia   . Impaired fasting blood sugar   . Keloid    mid sternum  . Macular degeneration   . Osteoarthritis   . Positional vertigo    benign  . Symptoms, such as flushing, sleeplessness, headache, lack of concentration, associated with the menopause   . TIA (transient ischemic attack)     HOSPITAL COURSE:   1.  NSTEMI with chest pain and elevated troponin.  The patient was taken to the cardiac Cath Lab and found to have 90% left main disease.  Case discussed with cardiology the patient has a balloon pump and will be brought to the ICU and transferred to tertiary care center [facility] for evaluation for bypass  surgery. Patient on aspirin.  Hold Plavix.  Patient on metoprolol and statin.  Patient was initially on heparin drip prior to cardiac catheterization.  Cardiology team to speak with family about plan.  2.  Chronic pneumobilia seen on CT scan.  Abdomen exam is benign.  Patient had prior cholecystectomy. 3.  GERD.  Patient on Protonix and H2 blocker at home 4.  Essential hypertension on losartan and metoprolol 5.  Hyperlipidemia unspecified LDL 47 on Lipitor. 6.  History of TIA on aspirin 7.  Anxiety.  Patient takes Ativan as needed 8.  Impaired fasting glucose.  DISCHARGE CONDITIONS:   Follow-up with tertiary care center cardiology, cardiothoracic team and intensivist team.  CONSULTS OBTAINED:  Treatment Team:  Nelva Bush, MD  DRUG ALLERGIES:   Allergies  Allergen Reactions  . Biaxin [Clarithromycin]     depressed  . Celecoxib Other (See Comments)    Reaction: Unknown  . Ciprofloxacin   . Codeine Other (See Comments)    Reaction: Unknown  . Hydromorphone   . Lisinopril     cough  . Macrobid [employer] Macro] Other (See Comments)    Reaction: Unknown  . Oxycodone Hcl Other (See Comments)    Reaction: Unknown  . Penicillins Other (See Comments)    Reaction: Unknown  . Prednisone   .  Sertraline Hcl Other (See Comments)    Reaction: Unknown    DISCHARGE MEDICATIONS:   Allergies as of 12/22/2016      Reactions   Biaxin [clarithromycin]    depressed   Celecoxib Other (See Comments)   Reaction: Unknown   Ciprofloxacin    Codeine Other (See Comments)   Reaction: Unknown   Hydromorphone    Lisinopril    cough   Macrobid [nitrofurantoin Monohyd Macro] Other (See Comments)   Reaction: Unknown   Oxycodone Hcl Other (See Comments)   Reaction: Unknown   Penicillins Other (See Comments)   Reaction: Unknown   Prednisone    Sertraline Hcl Other (See Comments)   Reaction: Unknown      Medication List    STOP taking these medications   amLODipine  10 MG tablet Commonly known as:  NORVASC   clopidogrel 75 MG tablet Commonly known as:  PLAVIX   guaiFENesin-dextromethorphan 100-10 MG/5ML syrup Commonly known as:  ROBITUSSIN DM   KLOR-CON M20 20 MEQ tablet Generic drug:  potassium chloride SA   omeprazole 20 MG capsule Commonly known as:  PRILOSEC     TAKE these medications   acetaminophen 325 MG tablet Commonly known as:  TYLENOL Take 2 tablets (650 mg total) by mouth every 6 (six) hours as needed for mild pain, fever or headache.   aspirin 81 MG tablet Take 81 mg by mouth daily.   atorvastatin 10 MG tablet Commonly known as:  LIPITOR Take 10 mg by mouth daily.   CALCIUM 600 + D PO Take 1 tablet by mouth daily. 600 mg/ 400 mg   estrogens (conjugated) 0.3 MG tablet Commonly known as:  PREMARIN Take 0.3 mg by mouth daily. Take daily for 21 days then do not take for 7 days.   furosemide 20 MG tablet Commonly known as:  LASIX Take 20 mg by mouth daily.   HYDROcodone-acetaminophen 5-325 MG tablet Commonly known as:  NORCO/VICODIN Take 1-2 tablets by mouth every 4 (four) hours as needed for moderate pain.   isosorbide mononitrate 30 MG 24 hr tablet Commonly known as:  IMDUR TAKE 0.5 TABLETS (15 MG TOTAL) BY MOUTH DAILY.   LORazepam 0.5 MG tablet Commonly known as:  ATIVAN Take 0.5 mg by mouth every 8 (eight) hours as needed for anxiety.   losartan 100 MG tablet Commonly known as:  COZAAR Take 1 tablet (100 mg total) by mouth daily.   metoprolol succinate 25 MG 24 hr tablet Commonly known as:  TOPROL-XL Take 3 tablets (75 mg total) by mouth daily. Take with or immediately following a meal. Start taking on:  12/23/2016 What changed:    medication strength  how much to take   morphine 2 MG/ML injection Inject 1 mL (2 mg total) into the vein every 2 (two) hours as needed (cp).   multivitamin tablet Take 1 tablet by mouth daily.   nitroGLYCERIN 0.4 MG SL tablet Commonly known as:  NITROSTAT PLACE 1  TABLET (0.4 MG TOTAL) UNDER THE TONGUE EVERY 5 (FIVE) MINUTES AS NEEDED FOR CHEST PAIN.   ondansetron 4 MG tablet Commonly known as:  ZOFRAN Take 1 tablet (4 mg total) by mouth every 6 (six) hours as needed for nausea.   pantoprazole 40 MG tablet Commonly known as:  PROTONIX Take 40 mg by mouth daily.   ranitidine 150 MG tablet Commonly known as:  ZANTAC Take 1 tablet (150 mg total) by mouth 2 (two) times daily.        DISCHARGE  INSTRUCTIONS:   Follow-up with teams at [facility]  If you experience worsening of your admission symptoms, develop shortness of breath, life threatening emergency, suicidal or homicidal thoughts you must seek medical attention immediately by calling 911 or calling your MD immediately  if symptoms less severe.  You Must read complete instructions/literature along with all the possible adverse reactions/side effects for all the Medicines you take and that have been prescribed to you. Take any new Medicines after you have completely understood and accept all the possible adverse reactions/side effects.   Please note  You were cared for by a hospitalist during your hospital stay. If you have any questions about your discharge medications or the care you received while you were in the hospital after you are discharged, you can call the unit and asked to speak with the hospitalist on call if the hospitalist that took care of you is not available. Once you are discharged, your primary care physician will handle any further medical issues. Please note that NO REFILLS for any discharge medications will be authorized once you are discharged, as it is imperative that you return to your primary care physician (or establish a relationship with a primary care physician if you do not have one) for your aftercare needs so that they can reassess your need for medications and monitor your lab values.    Today   CHIEF COMPLAINT:   Chief Complaint  Patient presents  with  . Abdominal Pain    HISTORY OF PRESENT ILLNESS:  [name]  is a 83 y.o. female presented with chest pain and found to have elevated troponin on second troponin.   VITAL SIGNS:  Blood pressure (!) 167/81, pulse 76, temperature 98.1 F (36.7 C), temperature source Oral, resp. rate 20, height 5\' 7"  (1.702 m), weight 77.6 kg (171 lb), SpO2 100 %.   PHYSICAL EXAMINATION:  GENERAL:  83 y.o.-year-old patient lying in the bed with no acute distress.  EYES: Pupils equal, round, reactive to light and accommodation. No scleral icterus. Extraocular muscles intact.  HEENT: Head atraumatic, normocephalic. Oropharynx and nasopharynx clear.  NECK:  Supple, no jugular venous distention. No thyroid enlargement, no tenderness.  LUNGS: Normal breath sounds bilaterally, no wheezing, rales,rhonchi or crepitation. No use of accessory muscles of respiration.  CARDIOVASCULAR: S1, S2 normal. No murmurs, rubs, or gallops.  ABDOMEN: Soft, non-tender, non-distended. Bowel sounds present. No organomegaly or mass.  EXTREMITIES: No pedal edema, cyanosis, or clubbing.  NEUROLOGIC: Cranial nerves II through XII are intact. Muscle strength 5/5 in all extremities. Sensation intact. Gait not checked.  PSYCHIATRIC: The patient is alert and oriented x 3.  SKIN: No obvious rash, lesion, or ulcer.   DATA REVIEW:   CBC Recent Labs  Lab 12/21/16 1812  WBC 6.5  HGB 12.9  HCT 38.6  PLT 178    Chemistries  Recent Labs  Lab 12/21/16 1812  NA 137  K 4.0  CL 106  CO2 24  GLUCOSE 147*  BUN 13  CREATININE 0.85  CALCIUM 9.0  AST 29  ALT 17  ALKPHOS 104  BILITOT 1.6*    Cardiac Enzymes Recent Labs  Lab 12/22/16 0437  TROPONINI 0.29*     RADIOLOGY:  Ct Abdomen Pelvis W Contrast  Addendum Date: 12/21/2016   ADDENDUM REPORT: 12/21/2016 19:52 ADDENDUM: This is an addendum to the original report. The pneumobilia identified on today's study was present on study from 07/08/2016 but is a new finding  when compared with 10/29/2011. Therefore, this  may reflect a benign process such as pneumobilia due to instrumentation. Further. in the absence of signs or symptoms of infection this makes the original consideration for cholangitis less likely. The filling defects discussed in today's report however appear to be a new finding from 10/29/2011. I cannot confidently confirm the these were present on study from 07/08/2016. And MRCP may be helpful to confirm presence of choledocholithiasis. These findings were discussed with Dr. Merlyn Lot at 12/21/2016, 7:51 p.m. Electronically Signed   By: Kerby Moors M.D.   On: 12/21/2016 19:52   Result Date: 12/21/2016 CLINICAL DATA:  Abdominal pain and fever. EXAM: CT ABDOMEN AND PELVIS WITH CONTRAST TECHNIQUE: Multidetector CT imaging of the abdomen and pelvis was performed using the standard protocol following bolus administration of intravenous contrast. CONTRAST:  163mL ISOVUE-300 IOPAMIDOL (ISOVUE-300) INJECTION 61% COMPARISON:  07/08/2016 FINDINGS: Lower chest: There is a small right pleural effusion. Unchanged from previous exam. Hepatobiliary: No focal liver abnormality. Pneumobilia is identified status post cholecystectomy. This is new when compared with 07/08/2016 and 10/29/2011. Soft tissue attenuating filling defects within the common bile duct are identified which appear new from previous exam. Pancreas: Unremarkable. No pancreatic ductal dilatation or surrounding inflammatory changes. Spleen: Normal in size without focal abnormality. Adrenals/Urinary Tract: The adrenal glands appear normal. Unremarkable appearance of the kidneys. No mass or hydronephrosis. Urinary bladder is unremarkable. Stomach/Bowel: The stomach is normal. The small bowel loops have a normal course and caliber without obstruction. The appendix is not visualized. No secondary signs of acute appendicitis. Vascular/Lymphatic: Aortic atherosclerosis. Branch vessel aortic atherosclerotic  calcifications noted. No aneurysm. No adenopathy within the abdomen or pelvis. Reproductive: Status post hysterectomy. No adnexal masses. Other: No free fluid or fluid collections. Musculoskeletal: Degenerative disc disease noted within the lumbar spine. IMPRESSION: 1. Interval development of pneumobilia. There also several soft tissue attenuating filling defects within the common bile duct which may represent sequelae of inflammation/ infection or choledocholithiasis, this is new from 07/08/2016. In the setting of suspected infection and elevated bilirubin cholangitis cannot be excluded. 2. No intra-abdominal abscess identified. 3.  Aortic Atherosclerosis (ICD10-I70.0). 4. Chronic right pleural effusion. Electronically Signed: By: Kerby Moors M.D. On: 12/21/2016 19:24   Dg Chest Portable 1 View  Result Date: 12/21/2016 CLINICAL DATA:  Epigastric pain EXAM: PORTABLE CHEST 1 VIEW COMPARISON:  07/08/2016 FINDINGS: Cardiac shadow is stable. Aortic calcifications are again seen. The lungs are well aerated. Postsurgical changes are noted on the right with mild volume loss. No acute infiltrate or sizable effusion is seen. No bony abnormality is noted. IMPRESSION: Postoperative changes on the right.  No acute abnormality seen. Electronically Signed   By: Inez Catalina M.D.   On: 12/21/2016 18:17      CODE STATUS:     Code Status Orders  (From admission, onward)        Start     Ordered   12/21/16 2345  Do not attempt resuscitation (DNR)  Continuous    Question Answer Comment  In the event of cardiac or respiratory ARREST Do not call a "code blue"   In the event of cardiac or respiratory ARREST Do not perform Intubation, CPR, defibrillation or ACLS   In the event of cardiac or respiratory ARREST Use medication by any route, position, wound care, and other measures to relive pain and suffering. May use oxygen, suction and manual treatment of airway obstruction as needed for comfort.      12/21/16  2344    Code Status History  Date Active Date Inactive Code Status Order ID Comments User Context   11/07/2015 00:49 11/07/2015 18:05 Full Code [id number]  Harvie Bridge, DO Inpatient   12/28/2014 09:16 12/30/2014 16:33 Full Code [id number]  Fritzi Mandes, MD Inpatient    Advance Directive Documentation     Most Recent Value  Type of Advance Directive  Healthcare Power of Attorney, Living will  Pre-existing out of facility DNR order (yellow form or pink MOST form)  No data  "MOST" Form in Place?  No data      TOTAL TIME TAKING CARE OF THIS PATIENT: 38 minutes.    Loletha Grayer M.D on 12/22/2016 at 1:46 PM  Between 7am to 6pm - Pager - [phone]  After 6pm go to [email or web address] - password [email or web address]  [employer] Office  [phone]  CC: Primary care physician; Lajean Manes, MD

## 2016-12-22 NOTE — Progress Notes (Signed)
Denies CP.

## 2016-12-22 NOTE — Progress Notes (Signed)
Pt called out. C/O CP  Mid sternal at 5/10. SL NTG given.

## 2016-12-22 NOTE — Progress Notes (Signed)
Troponin level of 0.29 called to Dr. Estanislado Pandy. Waiting for new orders to be placed.

## 2016-12-22 NOTE — Consult Note (Signed)
Patient see with Dr Alva Garnet. This is an 83 y/o female who presented with chest pain; ruled in for a NSTEMI and was taken to the cath lab for a LHC. She was found to have multivessel disease; requiring an IABP. She is awaiting transfer to [facility] for CABG surgery.   On exam, patient is lying in bed comfortably with baloon pump in place, NAD, AP regular, S1/S2, Lungs care CTAB, no edema  Assessment NSTEMI/CAD with multivessel disease; now with IABP Hypertension   Plan Treatment plan per cardiology Awaiting transfer to [facility]. EMTALA form completed PRN lorazepam given for anxiety  Family at bedside. All questions answered  Dawn Convery S. [facility] ANP-BC Pulmonary and Critical Care Medicine [facility] Pager [phone] or [phone]

## 2016-12-22 NOTE — Interval H&P Note (Signed)
History and Physical Interval Note:  12/22/2016 12:25 PM  [name]  has presented today for surgery, with the diagnosis of NSTEMI  The various methods of treatment have been discussed with the patient and family. After consideration of risks, benefits and other options for treatment, the patient has consented to  Procedure(s): LEFT HEART CATH AND CORONARY ANGIOGRAPHY (N/A) as a surgical intervention .  The patient's history has been reviewed, patient examined, no change in status, stable for surgery.  I have reviewed the patient's chart and labs.  Questions were answered to the patient's satisfaction.   Cath Lab Visit (complete for each Cath Lab visit)  Clinical Evaluation Leading to the Procedure:   ACS: Yes.    Non-ACS:  N/A  Leemon Ayala

## 2016-12-22 NOTE — Progress Notes (Signed)
Patient transferred to [facility] balloon pump at this time.

## 2016-12-22 NOTE — Progress Notes (Signed)
Patient just arriving from [place] Discussed with nurse IABP site fine Augmenting well 1/1 Has large echymosis over left hand from attempted iv sticks SEM lungs clear On iv nitro will titrate up to 10 ug/ as systolic BP 675-449 mmHg No chest pain  Spoke with Dr Roxy Manns on phone and he knows she is coming and will See her tonight  [name]

## 2016-12-22 NOTE — Consult Note (Signed)
[address]       [place] [address]             [phone]          CARDIOTHORACIC SURGERY CONSULTATION REPORT  PCP is Lajean Manes, MD Referring Provider is End, Harrell Gave, MD Primary Cardiologist is Minna Merritts, MD  Reason for consultation:  Left Main and 3-vessel CAD  HPI:  Patient is an 83 year old female with known history of coronary artery disease status post multiple previous coronary interventions in the past, hypertension, hyperlipidemia, previous TIA, chronic diarrhea, depression, anxiety, and previous right lower lobectomy for atypical carcinoid tumor of the lung who presented with acute coronary syndrome and weakly positive cardiac enzymes, underwent catheterization earlier today at [facility] by Dr. Saunders Revel demonstrating critical left main and three-vessel coronary artery disease, and was transferred to [facility] for surgical consultation.  The patient's cardiac history dates back to 1986 when she underwent PTCA of the left anterior descending coronary artery.  2001 she had PCI and stenting of the left anterior descending coronary artery and the left circumflex.  For the past 6 months the patient states she has been having symptoms of substernal chest pain associated with shortness of breath.  Symptoms initially were associated with activity or meals and frequently relieved by sublingual nitroglycerin.  She was hospitalized in June 2018 and ruled out for myocardial infarction.  Noninvasive stress test was recommended but the patient declined.  Symptoms have progressed recently and on the afternoon of December 21, 2016 she was awoken with severe pain between her shoulder blades chest and upper abdomen, all associated with shortness of breath.  Symptoms persisted for more than 20 minutes.  EMS was called and she was [facility] where ECG revealed sinus rhythm without acute changes.  Initial troponin was normal but subsequent enzymes  increased to 0.29.  The patient underwent diagnostic cardiac catheterization earlier today demonstrating critical left main and three-vessel coronary artery disease.  The patient had some chest pain prior to and during catheterization, and intra-aortic balloon pump was placed.  The patient has been pain-free ever since.  She was transferred to [facility] for further management.  The patient is divorced and lives with her son and his family in [place].  She has remained reasonably functionally independent although she admits that she has been slowing down considerably over the past 6 months.  She has been severely limited by generalized fatigue, exertional shortness of breath, and frequent chest pain.  She denies resting shortness of breath, PND, orthopnea, or lower extremity edema.  She walks without need for cane or other mechanical support.  Past Medical History:  Diagnosis Date  . Anxiety   . Atypical carcinoid lung tumor ([place])   . CAD (coronary artery disease)    a. 1986: s/p PTCA of bifurcation lesion in LAD;  b. 2001: s/p BMS to LAD & LCX;  c. 04/2006 Cath: diff CAD w/o critical narrowing, nl EF;  d. 12/2014 MV: inflat ST dep, mild basal inf ischemia, EF 55-65%-->Low risk; e. 12/2016 Cath: LM 90d, LAD 90ost, 50p, 39m LCX 80ost, 226mRCA 90p, 7033m0-CTO w/ L->R collats filling RPDA.  . Cancer ([place] . Carotid arterial disease ([place]  a. 08/2015 Carotid U/S: < 39% bilat ICA stenosis.  . Carotid disease, bilateral ([place]  a. RICA 0-38-27%ICA 40-07-86% Cataracts, bilateral   . Cholelithiases   .  Chronic diarrhea   . Depression   . Gilbert's syndrome   . History of echocardiogram    a. 06/2010 Echo: EF 60-65%, mild AI/MR; b. 10/2015 Echo: EF 60-65%, no rwma, mild MR, nl RV fxn.  Marland Kitchen HTN (hypertension)   . Hyperlipidemia   . Impaired fasting blood sugar   . Keloid    mid sternum  . Macular degeneration   . Osteoarthritis   . Positional vertigo    benign  . Symptoms,  such as flushing, sleeplessness, headache, lack of concentration, associated with the menopause   . TIA (transient ischemic attack)     Past Surgical History:  Procedure Laterality Date  . ABDOMINAL AORTOGRAM N/A 12/22/2016   Procedure: ABDOMINAL AORTOGRAM;  Surgeon: Nelva Bush, MD;  Location: [place] [facility];  Service: Cardiovascular;  Laterality: N/A;  . carcinoid     removed from lung, burney  . IABP INSERTION N/A 12/22/2016   Procedure: IABP Insertion;  Surgeon: Nelva Bush, MD;  Location: [place] [facility];  Service: Cardiovascular;  Laterality: N/A;  . LEFT HEART CATH AND CORONARY ANGIOGRAPHY N/A 12/22/2016   Procedure: LEFT HEART CATH AND CORONARY ANGIOGRAPHY;  Surgeon: Nelva Bush, MD;  Location: [place] [facility];  Service: Cardiovascular;  Laterality: N/A;  . LEFT HEART CATHETERIZATION WITH CORONARY ANGIOGRAM N/A 04/27/2012   Procedure: LEFT HEART CATHETERIZATION WITH CORONARY ANGIOGRAM;  Surgeon: Peter M Martinique, MD;  Location: [facility] CATH LAB;  Service: Cardiovascular;  Laterality: N/A;  . pci  1986  . PCI of LAD  2001   with non des lad   . pci of mid cfx     with non des and cutting balloon pci of av circumflex    Family History  Problem Relation Age of Onset  . Diabetes Mellitus I Mother   . CAD Mother   . CAD Father   . Diabetes Mellitus I Sister   . Diabetes Mellitus I Brother   . Diabetes Mellitus I Sister     Social History   Socioeconomic History  . Marital status: Divorced    Spouse name: Not on file  . Number of children: Not on file  . Years of education: Not on file  . Highest education level: Not on file  Social Needs  . Financial resource strain: Not on file  . Food insecurity - worry: Not on file  . Food insecurity - inability: Not on file  . Transportation needs - medical: Not on file  . Transportation needs - non-medical: Not on file  Occupational History  . Occupation: retired  Tobacco Use  . Smoking status: Never  Smoker  . Smokeless tobacco: Never Used  Substance and Sexual Activity  . Alcohol use: No  . Drug use: No  . Sexual activity: Not on file  Other Topics Concern  . Not on file  Social History Narrative  . Not on file    Prior to Admission medications   Medication Sig Start Date End Date Taking? Authorizing Provider  acetaminophen (TYLENOL) 325 MG tablet Take 2 tablets (650 mg total) by mouth every 6 (six) hours as needed for mild pain, fever or headache. Patient not taking: Reported on 12/21/2016 11/07/15   Nicholes Mango, MD  aspirin 81 MG tablet Take 81 mg by mouth daily.    [provider]  atorvastatin (LIPITOR) 10 MG tablet Take 10 mg by mouth daily.    [provider]  Calcium Carbonate-Vitamin D (CALCIUM 600 + D PO) Take 1 tablet  by mouth daily. 600 mg/ 400 mg    [provider]  estrogens, conjugated, (PREMARIN) 0.3 MG tablet Take 0.3 mg by mouth daily. Take daily for 21 days then do not take for 7 days.    [provider]  furosemide (LASIX) 20 MG tablet Take 20 mg by mouth daily.    [provider]  HYDROcodone-acetaminophen (NORCO/VICODIN) 5-325 MG tablet Take 1-2 tablets by mouth every 4 (four) hours as needed for moderate pain. 12/22/16   Loletha Grayer, MD  isosorbide mononitrate (IMDUR) 30 MG 24 hr tablet TAKE 0.5 TABLETS (15 MG TOTAL) BY MOUTH DAILY. 12/21/16   Dunn, Areta Haber, PA-C  LORazepam (ATIVAN) 0.5 MG tablet Take 0.5 mg by mouth every 8 (eight) hours as needed for anxiety.     [provider]  losartan (COZAAR) 100 MG tablet Take 1 tablet (100 mg total) by mouth daily. 09/10/16 12/21/16  Minna Merritts, MD  metoprolol succinate (TOPROL-XL) 25 MG 24 hr tablet Take 3 tablets (75 mg total) by mouth daily. Take with or immediately following a meal. 12/23/16   Loletha Grayer, MD  morphine 2 MG/ML injection Inject 1 mL (2 mg total) into the vein every 2 (two) hours as needed (cp). 12/22/16   Loletha Grayer, MD    Multiple Vitamin (MULTIVITAMIN) tablet Take 1 tablet by mouth daily.    [provider]  nitroGLYCERIN (NITROSTAT) 0.4 MG SL tablet PLACE 1 TABLET (0.4 MG TOTAL) UNDER THE TONGUE EVERY 5 (FIVE) MINUTES AS NEEDED FOR CHEST PAIN. 10/23/16   Minna Merritts, MD  ondansetron (ZOFRAN) 4 MG tablet Take 1 tablet (4 mg total) by mouth every 6 (six) hours as needed for nausea. 12/22/16   Loletha Grayer, MD  pantoprazole (PROTONIX) 40 MG tablet Take 40 mg by mouth daily.    [provider]  ranitidine (ZANTAC) 150 MG tablet Take 1 tablet (150 mg total) by mouth 2 (two) times daily. Patient not taking: Reported on 12/21/2016 07/08/16 07/08/17  Merlyn Lot, MD    Current Facility-Administered Medications  Medication Dose Route Frequency Provider Last Rate Last Dose  . MEDLINE mouth rinse  15 mL Mouth Rinse BID Josue Hector, MD        Allergies  Allergen Reactions  . Biaxin [Clarithromycin]     depressed  . Celecoxib Other (See Comments)    Reaction: Unknown  . Ciprofloxacin   . Codeine Other (See Comments)    Reaction: Unknown  . Hydromorphone   . Lisinopril     cough  . Macrobid [employer] Macro] Other (See Comments)    Reaction: Unknown  . Oxycodone Hcl Other (See Comments)    Reaction: Unknown  . Penicillins Other (See Comments)    Reaction: Unknown  . Prednisone   . Sertraline Hcl Other (See Comments)    Reaction: Unknown      Review of Systems:   General:  decreased appetite, decreased energy, no weight gain, no weight loss, no fever  Cardiac:  + chest pain with exertion, + chest pain at rest, +SOB with exertion, no resting SOB, no PND, no orthopnea, no palpitations, no arrhythmia, no atrial fibrillation, no LE edema, no dizzy spells, no syncope  Respiratory:  + exertional shortness of breath, no home oxygen, no productive cough, no dry cough, no bronchitis, no wheezing, no hemoptysis, no asthma, no pain with inspiration or cough, no  sleep apnea, no CPAP at night  GI:   no difficulty swallowing, no reflux, no frequent  heartburn, no hiatal hernia, + abdominal pain, no constipation, + chronic diarrhea, no hematochezia, no hematemesis, no melena  GU:   no dysuria,  no frequency, no urinary tract infection, no hematuria no kidney stones, no kidney disease  Vascular:  no pain suggestive of claudication, no pain in feet, no leg cramps, no varicose veins, no DVT, no non-healing foot ulcer  Neuro:   no stroke, + previous TIA's, no seizures, + headaches, no temporary blindness one eye,  no slurred speech, no peripheral neuropathy, no chronic pain, no instability of gait, no memory/cognitive dysfunction  Musculoskeletal: mild arthritis, + joint swelling, no myalgias, no difficulty walking, slightly limited mobility   Skin:   no rash, no itching, no skin infections, no pressure sores or ulcerations  Psych:   + anxiety, + depression, + nervousness, no unusual recent stress  Eyes:   no blurry vision, no floaters, no recent vision changes, + wears glasses or contacts  ENT:   no hearing loss, no loose or painful teeth  Hematologic:  + easy bruising, + abnormal bleeding, no clotting disorder, no frequent epistaxis  Endocrine:  no diabetes, does not check CBG's at home     Physical Exam:   BP (!) 174/60 (BP Location: Left Arm)   Pulse 73   Temp 98 F (36.7 C) (Oral)   Resp 14   Ht 5' 6" (1.676 m)   Wt 171 lb 8.3 oz (77.8 kg)   SpO2 99%   BMI 27.68 kg/m   General:  Thin, elderly and frail-appearing  HEENT:  Unremarkable   Neck:   no JVD, no bruits, no adenopathy   Chest:   clear to auscultation, symmetrical breath sounds, no wheezes, no rhonchi   CV:   RRR, no  murmur   Abdomen:  soft, non-tender, no masses   Extremities:  warm, well-perfused, pulses thready and barely palpable, no lower extremity edema  Rectal/GU  Deferred  Neuro:   Grossly non-focal and symmetrical throughout  Skin:   Clean and dry, no rashes, no  breakdown  Diagnostic Tests:  Transthoracic Echocardiography  Patient:    [name], [name] MR #:       [id number] Study Date: 12/22/2016 Gender:     F Age:        56 Height:     170.2 cm Weight:     77.6 kg BSA:        1.93 m^2 Pt. Status: Room:       IC01A   ORDERING     Nelva Bush, MD  REFERRING    Nelva Bush, MD  ATTENDING    [place], [place] RDCS  PERFORMING   [facility], [employer]  ADMITTING    Salary, Montell D  cc:  ------------------------------------------------------------------- LV EF: 60% -   65%  ------------------------------------------------------------------- Indications:      Acute myocardial infarction 22.  ------------------------------------------------------------------- History:   PMH:  Anxiety, carotid arterial disease, history of echocardiogram, hyperlipidemia.  Coronary artery disease. Transient ischemic attack.  Risk factors:  Hypertension.  ------------------------------------------------------------------- Study Conclusions  - Left ventricle: The cavity size was normal. Wall thickness was   normal. Systolic function was normal. The estimated ejection   fraction was in the range of 60% to 65%. Doppler parameters are   consistent with abnormal left ventricular relaxation (grade 1   diastolic dysfunction). - Aortic valve: Mildly thickened leaflets. - Mitral valve: Calcified annulus. Mildly thickened leaflets . - Right ventricle: Poorly visualized. The cavity size was  normal.   Systolic function was normal. - Right atrium: The atrium was mildly dilated.  ------------------------------------------------------------------- Study data:   Study status:  Routine.  Procedure:  The patient reported no pain pre or post test. Transthoracic echocardiography. The study was technically difficult, as a result of restricted patient mobility.          Transthoracic echocardiography.  M-mode, complete 2D, spectral  Doppler, and color Doppler.  Birthdate: Patient birthdate: January 22, 1930.  Age:  Patient is 83 yr old.  Sex: Gender: female.    BMI: 26.8 kg/m^2.  Blood pressure:     167/114 Patient status:  Inpatient.  Study date:  Study date: 12/22/2016. Study time: 02:07 PM.  Location:  ICU/CCU  -------------------------------------------------------------------  ------------------------------------------------------------------- Left ventricle:  The cavity size was normal. Wall thickness was normal. Systolic function was normal. The estimated ejection fraction was in the range of 60% to 65%. Images were inadequate for LV wall motion assessment. Doppler parameters are consistent with abnormal left ventricular relaxation (grade 1 diastolic dysfunction).  ------------------------------------------------------------------- Aortic valve:  Poorly visualized.  Mildly thickened leaflets. Doppler:  Transvalvular velocity was within the normal range. There was no stenosis. There was no significant regurgitation.    VTI ratio of LVOT to aortic valve: 1.06. Valve area (VTI): 3.33 cm^2. Indexed valve area (VTI): 1.73 cm^2/m^2. Peak velocity ratio of LVOT to aortic valve: 0.85. Valve area (Vmax): 2.66 cm^2. Indexed valve area (Vmax): 1.38 cm^2/m^2. Mean velocity ratio of LVOT to aortic valve: 0.96. Valve area (Vmean): 3 cm^2. Indexed valve area (Vmean): 1.56 cm^2/m^2.    Mean gradient (S): 4 mm Hg. Peak gradient (S): 8 mm Hg.  ------------------------------------------------------------------- Aorta:  Aortic root: The aortic root was normal in size.  ------------------------------------------------------------------- Mitral valve:   Calcified annulus. Mildly thickened leaflets . Leaflet separation was normal.  Doppler:  Transvalvular velocity was within the normal range. There was no evidence for stenosis. There was trivial regurgitation.    Valve area by pressure half-time: 3.24 cm^2. Indexed valve area  by pressure half-time: 1.68 cm^2/m^2.    Peak gradient (D): 3 mm Hg.  ------------------------------------------------------------------- Left atrium:  The atrium was at the upper limits of normal in size.   ------------------------------------------------------------------- Right ventricle:  Poorly visualized. The cavity size was normal. Systolic function was normal.  ------------------------------------------------------------------- Pulmonic valve:   Poorly visualized.  ------------------------------------------------------------------- Pulmonary artery:   Poorly visualized.  ------------------------------------------------------------------- Right atrium:  The atrium was mildly dilated.  ------------------------------------------------------------------- Pericardium:  Not well visualized.  ------------------------------------------------------------------- Systemic veins: Inferior vena cava: Not well visualized.  ------------------------------------------------------------------- Measurements   Left ventricle                           Value          Reference  LV ID, ED, PLAX chordal          (L)     38.2  mm       43 - 52  LV ID, ES, PLAX chordal                  24.8  mm       23 - 38  LV fx shortening, PLAX chordal           35    %        >=29  LV PW thickness, ED  8.75  mm       ----------  IVS/LV PW ratio, ED                      1.14           <=1.3  Stroke volume, 2D                        92    ml       ----------  Stroke volume/bsa, 2D                    48    ml/m^2   ----------    Ventricular septum                       Value          Reference  IVS thickness, ED                        9.98  mm       ----------    LVOT                                     Value          Reference  LVOT ID, S                               20    mm       ----------  LVOT area                                3.14  cm^2     ----------  LVOT peak  velocity, S                    117   cm/s     ----------  LVOT mean velocity, S                    78.1  cm/s     ----------  LVOT VTI, S                              29.3  cm       ----------  LVOT peak gradient, S                    5     mm Hg    ----------    Aortic valve                             Value          Reference  Aortic valve peak velocity, S            138   cm/s     ----------  Aortic valve mean velocity, S            81.7  cm/s     ----------  Aortic valve VTI, S                      27.6  cm       ----------  Aortic mean gradient, S                  4     mm Hg    ----------  Aortic peak gradient, S                  8     mm Hg    ----------  VTI ratio, LVOT/AV                       1.06           ----------  Aortic valve area, VTI                   3.33  cm^2     ----------  Aortic valve area/bsa, VTI               1.73  cm^2/m^2 ----------  Velocity ratio, peak, LVOT/AV            0.85           ----------  Aortic valve area, peak velocity         2.66  cm^2     ----------  Aortic valve area/bsa, peak              1.38  cm^2/m^2 ----------  velocity  Velocity ratio, mean, LVOT/AV            0.96           ----------  Aortic valve area, mean velocity         3     cm^2     ----------  Aortic valve area/bsa, mean              1.56  cm^2/m^2 ----------  velocity    Aorta                                    Value          Reference  Aortic root ID, ED                       24    mm       ----------    Left atrium                              Value          Reference  LA ID, A-P, ES                           42    mm       ----------  LA ID/bsa, A-P                           2.18  cm/m^2   <=2.2  LA area, ES, A4C                         17.92 cm^2     8.8 - 23.4  LA volume, ES, 1-p A4C                   22.5  ml       ----------  LA volume/bsa, ES, 1-p A4C  11.7  ml/m^2   ----------    Mitral valve                             Value          Reference   Mitral E-wave peak velocity              88.8  cm/s     ----------  Mitral A-wave peak velocity              122   cm/s     ----------  Mitral deceleration time         (H)     232   ms       150 - 230  Mitral pressure half-time                68    ms       ----------  Mitral peak gradient, D                  3     mm Hg    ----------  Mitral E/A ratio, peak                   0.7            ----------  Mitral valve area, PHT, DP               3.24  cm^2     ----------  Mitral valve area/bsa, PHT, DP           1.68  cm^2/m^2 ----------    Right atrium                             Value          Reference  RA ID, S-I, ES, A4C              (H)     57.5  mm       34 - 49  RA area, ES, A4C                 (H)     20    cm^2     8.3 - 19.5  RA volume, ES, A/L                       55.4  ml       ----------  RA volume/bsa, ES, A/L                   28.7  ml/m^2   ----------    Right ventricle                          Value          Reference  RV ID, ED, PLAX                          21.5  mm       19 - 38  TAPSE                                    23    mm       ----------  RV  s&', lateral, S                        13.5  cm/s     ----------    Pulmonic valve                           Value          Reference  Pulmonic valve peak velocity, S          74.2  cm/s     ----------  Legend: (L)  and  (H)  mark values outside specified reference range.  ------------------------------------------------------------------- Prepared and Electronically Authenticated by  Nelva Bush, MD 2018-12-11T16:18:49    ABDOMINAL AORTOGRAM  IABP Insertion  LEFT HEART CATH AND CORONARY ANGIOGRAPHY  Conclusion   Conclusions: 1. Severe, three vessel coronary artery disease, including 90% distal LMCA stenosis involving the ostia of the LAD and LCx, as well as chronic total occlusion of the RCA. Distal RCA fills via left-to-right collaterals. 2. Mildly elevated left ventricular filling  pressure. 3. Abdominal aortic atherosclerosis and calcification without significant stenosis and aneurysm. 4. Successful placement of 40 mL intraaortic balloon pump via the right common femoral artery.  Recommendations: 1. Transfer to [facility] for cardiac surgery consultation. Though the patient is DNR, she was active until her chest pain began ~6 months ago. She is willing to rescind the DNR order for procedures. Surgical revascularization is preferable over PCI, as PCI would require Impella support and atherectomy with significant risk of morbidity and mortality. 2. Initiate heparin infusion (no bolus) 2 hours after removal of right radial artery sheath. 3. Discontinue clopidogrel. Last dose was given this morning (12/22/16). 4. NTG infusion titrated to relief of chest pain. 5. Increase statin therapy. 6. Obtain echo today for evaluation of LV function.  Nelva Bush, MD [facility] Pager: [phone]   Indications   Non-ST elevation (NSTEMI) myocardial infarction ([place]) [I21.4 (ICD-10-CM)]  Procedural Details/Technique   Technical Details Indication: 83 y.o. year-old woman with history of CAD s/p multiple PTCA/PCIs, hypertension, hyperlipidemia, TIA, chronic diarrhea, depression, anxiety, and atypical lung carcinoid, admitted with worsening chest pain over the last 6 months. Pain has been significantly more severe and present at rest over the last few days. Troponin has trended up to 0.29 since presentation, consistent with NSTEMI.  GFR: >60 ml/min  Procedure: The risks, benefits, complications, treatment options, and expected outcomes were discussed with the patient. The patient and/or family concurred with the proposed plan, giving informed consent. The patient was brought to the cath lab after IV hydration was begun and oral premedication was given. The patient was further sedated with Versed and Fentanyl. The right wrist was assessed with a modified Allens test which was  normal. The right wrist was prepped and draped in a sterile fashion. 1% lidocaine was used for local anesthesia. Using the modified Seldinger access technique, a 68F slender Glidesheath was placed in the right radial artery. 3 mg Verapamil was given through the sheath. Heparin 3,500 units were administered.  Selective coronary angiography was performed using 100F Jacky 4.0 catheter to engage the left and right coronary arteries. Left heart catheterization was performed using a 100F Jacky 4.0 catheter. Left ventriculogram was not performed.  Due to continued chest pain before and during the procedure, the decision was made to proceed with intraaortic balloon pump placement to improve coronary perfusion. Under 1% lidocaine local anesthesia, the right common femoral artery was  cannulated with a 52F micropuncture kit. Contrast injection through the micropuncture sheath confirmed common femoral arteriotomy without significant external iliac or common femoral artery disease. The 52F micropuncture sheath was exchanged for a 52F sheath using modified Seldinger technique. A 52F pigtail was positioned in the proximal abdominal aorta and abdominal aortogram performed with a power injection of contrast. This revealed atherosclerosis of the aorta without significant stenosis or aneurysmal dilation. The 52F sheath was exchanged over the wire for a 7.52F sheath. This allowed a 40 mL intraaortic balloon pump to be positioned in the descending aorta.  At the end of the procedure, the radial artery sheath was removed and a TR band applied to achieve patent hemostasis. The IABP was secured in placed. There were no immediate complications. The patient was taken to the ICU in stable condition, awaiting transfer to [facility].  Contrast used: 75 mL Isovue Fluoroscopy time: 7.6 min Radiation dose: 842 mGy   Estimated blood loss <50 mL.  During this procedure the patient was administered the following to achieve and maintain moderate  conscious sedation: Versed 1 mg, Fentanyl 50 mcg, while the patient's heart rate, blood pressure, and oxygen saturation were continuously monitored. The period of conscious sedation was 62 minutes, of which I was present face-to-face 100% of this time.  Complications   Complications documented before study signed (12/22/2016 2:29 PM EST)    No complications were associated with this study.  Documented by Nelva Bush, MD - 12/22/2016 2:15 PM EST    Coronary Findings   Diagnostic  Dominance: Right  Left Main  Vessel is moderate in size.  Dist LM to Ost LAD lesion 90% stenosed  Dist LM to Ost LAD lesion is 90% stenosed. The lesion is eccentric. The lesion is severely calcified.  Left Anterior Descending  Vessel is large.  Ost LAD lesion 70% stenosed  Ost LAD lesion is 70% stenosed.  Prox LAD lesion 50% stenosed  Prox LAD lesion is 50% stenosed. The lesion was previously treated using a bare metal stent . Previously placed stent displays restenosis.  Mid LAD lesion 40% stenosed  Mid LAD lesion is 40% stenosed.  First Diagonal Branch  Vessel is small in size.  First Septal Branch  Vessel is moderate in size.  Second Diagonal Branch  Vessel is small in size.  Third Diagonal Branch  Vessel is small in size.  Left Circumflex  Ost Cx lesion 80% stenosed  Ost Cx lesion is 80% stenosed. The lesion is calcified.  Mid Cx lesion 20% stenosed  Mid Cx lesion is 20% stenosed. The lesion was previously treated using a bare metal stent . Previously placed stent displays restenosis.  First Obtuse Marginal Branch  Vessel is moderate in size.  Second Obtuse Marginal Branch  Vessel is small in size.  Third Obtuse Marginal Branch  Vessel is moderate in size.  Right Coronary Artery  Prox RCA lesion 90% stenosed  Prox RCA lesion is 90% stenosed. The lesion is calcified.  Mid RCA-1 lesion 70% stenosed  Mid RCA-1 lesion is 70% stenosed. The lesion is calcified.  Mid RCA-2 lesion 100%  stenosed  Mid RCA-2 lesion is 100% stenosed. The lesion is chronically occluded with left-to-right collateral flow.  Right Posterior Descending Artery  Collaterals  RPDA filled by collaterals from 1st Sept.    Intervention   No interventions have been documented.  Impella/IABP   Hemodynamic Support An IABP was inserted for hemodynamic support in the setting of cardiogenic shock.  Left Heart  Left Ventricle LV end diastolic pressure is mildly elevated. LVEDP 20-25 mmHg.  Aortic Valve There is no aortic valve stenosis.  Coronary Diagrams   Diagnostic Diagram       Implants     No implant documentation for this case.  MERGE Images   Show images for CARDIAC CATHETERIZATION   Link to Procedure Log   Procedure Log    Hemo Data 12/22/16 1203--12/22/16 1342 before discharge   AO Systolic Cath Pressure AO Diastolic Cath Pressure AO Mean Cath Pressure LV Systolic Cath Pressure LV End Diastolic  096 53 mmHg 93 mmHg - -  - - - 146 mmHg 23 mmHg  - - - 146 mmHg 25 mmHg  - - - 137 mmHg 20 mmHg  133 62 mmHg 89 mmHg - -  142 61 mmHg 97 mmHg - -  142 57 mmHg 85 mmHg - -  180 52 mmHg 93 mmHg      STS Risk Calculator Procedure: Isolated CAB CALCULATE  Risk of Mortality:  11.946%   Renal Failure:  5.355%   Permanent Stroke:  5.244%   Prolonged Ventilation:  39.217%   DSW Infection:  0.168%   Reoperation:  3.089%   Morbidity or Mortality:  44.389%   Short Length of Stay:  10.320%   Long Length of Stay:  18.062%       Impression:  Patient has critical left main and three-vessel coronary artery disease with preserved left ventricular systolic function.  She presents with a six-month history of worsening symptoms of angina pectoris, culminating in her current admission with acute coronary syndrome and weakly positive cardiac enzymes consistent with non-ST segment elevation myocardial infarction.  I have personally reviewed the patient's diagnostic cardiac catheterization and  transthoracic echocardiogram.  There is no question that based upon coronary anatomy surgical revascularization would provide the best opportunity for coronary revascularization.  However, risks of surgery would be very high and even without complications the patient might not recover completely to her previous quality of life.  An attempt at PCI and stenting would be extremely high risk even with temporary Impella circulatory support.   Plan:  I have discussed the nature of the patient's current clinical problem and results of her diagnostic cardiac catheterization and echocardiogram at length with the patient, her son, and her 2 grandchildren at the bedside.  Alternative treatment options have been discussed at length including high risk coronary artery bypass grafting, extremely high risk PCI and stenting, and long-term palliative medical therapy.  Without some type of revascularization the patient's prognosis is clearly extremely poor.  I suspect that the patient would survive bypass surgery but she would require at least short-term placement in some type of rehab facility even without any complications.  I suspect there is perhaps a 50% chance that she could recover to a reasonably good quality of life, but her recovery would certainly be slow with or without any perioperative complications.  High risk PCI and stenting may or may not be feasible, and even with Impella circulatory support there is a significant possibility that the patient might suffer a fatal myocardial infarction.  After considerable discussion with her entire family present, the patient has decided that she does not wish to go through high risk surgery and would prefer to consider high risk PCI and stenting if it is possible.  She understands and specifically requests that if she were to suffer an acute cardiac arrest she would not wish to be resuscitated.   I spent in  excess of 120 minutes during the conduct of this hospital  consultation and >50% of this time involved direct face-to-face encounter for counseling and/or coordination of the patient's care.    Audrey Gu. Roxy Manns, MD 12/22/2016 7:06 PM

## 2016-12-22 NOTE — Progress Notes (Signed)
MD End at bedside. This RN had MD End to assess hematoma. At this time hematoma had not grown but patient still complaining of pain. MD End stated to place a compression bandage on, this RN placed bandage. MD End instructed [facility] RN to remove compression bandage once they arrive to [place].

## 2016-12-22 NOTE — H&P (Signed)
Cardiology H & P    Patient ID: [name] MRN: [id number], DOB/AGE: 03/26/1930   Admit date: 12/21/2016 Date of Consult: 12/22/2016  Primary Physician: Lajean Manes, MD Primary Cardiologist: Ida Rogue, MD Requesting Provider: R. Leslye Peer, MD  Patient Profile    [name] is a 83 y.o. female with a history of CAD s/p prior LAD and LCX interventions, HTN, HL, TIA, chronic diarrhea, depression, anxiety, and atypical lung carcinoid, who was admitted 12/10 with chest and abdominal pain, ruled in for NSTEMI, and underwent cath revealing severe LM disease.  Past Medical History   Past Medical History:  Diagnosis Date  . Anxiety   . Atypical carcinoid lung tumor ([place])   . CAD (coronary artery disease)    a. 1986: s/p PTCA of bifurcation lesion in LAD;  b. 2001: s/p BMS to LAD & LCX;  c. 04/2006 Cath: diff CAD w/o critical narrowing, nl EF;  d. 12/2014 MV: inflat ST dep, mild basal inf ischemia, EF 55-65%-->Low risk; e. 12/2016 Cath: LM 90d, LAD 90ost, 50p, 65m, LCX 80ost, 62m, RCA 90p, 52m/100-CTO w/ L->R collats filling RPDA.  . Cancer ([place])   . Carotid arterial disease ([place])    a. 08/2015 Carotid U/S: < 39% bilat ICA stenosis.  . Carotid disease, bilateral ([place])    a. RICA 7-56%, LICA 43-32%  . Cataracts, bilateral   . Cholelithiases   . Chronic diarrhea   . Depression   . Gilbert's syndrome   . History of echocardiogram    a. 06/2010 Echo: EF 60-65%, mild AI/MR; b. 10/2015 Echo: EF 60-65%, no rwma, mild MR, nl RV fxn.  Marland Kitchen HTN (hypertension)   . Hyperlipidemia   . Impaired fasting blood sugar   . Keloid    mid sternum  . Macular degeneration   . Osteoarthritis   . Positional vertigo    benign  . Symptoms, such as flushing, sleeplessness, headache, lack of concentration, associated with the menopause   . TIA (transient ischemic attack)           Past Surgical History:  Procedure Laterality Date  . carcinoid     removed from lung, [place]  . LEFT HEART  CATHETERIZATION WITH CORONARY ANGIOGRAM N/A 04/27/2012   Procedure: LEFT HEART CATHETERIZATION WITH CORONARY ANGIOGRAM;  Surgeon: Peter M Martinique, MD;  [facility] CATH LAB;  Service: Cardiovascular;  Laterality: N/A;  . pci  1986  . PCI of LAD  2001   with non des lad   . pci of mid cfx     with non des and cutting balloon pci of av circumflex    Allergies       Allergies  Allergen Reactions  . Biaxin [Clarithromycin]     depressed  . Celecoxib Other (See Comments)    Reaction: Unknown  . Ciprofloxacin   . Codeine Other (See Comments)    Reaction: Unknown  . Hydromorphone   . Lisinopril     cough  . Macrobid [employer] Macro] Other (See Comments)    Reaction: Unknown  . Oxycodone Hcl Other (See Comments)    Reaction: Unknown  . Penicillins Other (See Comments)    Reaction: Unknown  . Prednisone   . Sertraline Hcl Other (See Comments)    Reaction: Unknown    History of Present Illness    83 y/o ? with the above complex past medical history including coronary artery disease status post PTCA of the LAD in 1986 followed by bare-metal stenting of the  LAD and circumflex in 2001.  Other history includes hypertension, hyperlipidemia, TIA, chronic diarrhea, depression, anxiety, and atypical lung carcinoid.  Most Recent Ischemic testing took place in December 2016, at which time a Myoview was performed and showed mild basal inferior ischemia.  The area affected was felt to be small and thus, this was felt to be a low risk study.  More recently, echocardiography in October 2017 showed normal LV function, with an EF of 60-65%.  Patient reports a long history of intermittent abdominal discomfort and diarrhea.  Over the past 6 months, this seems to be more frequent and has also included retrosternal chest discomfort and sometimes mid scapular back pain.  Symptoms typically occur on a daily basis at rest and generally last less than 15-20 minutes.   Sometimes symptoms can be provoked by certain foods but she does not think that there is a clear connection between eating and chest/abdominal discomfort.  At times, she has taken sublingual nitroglycerin with relief of symptoms.  On the afternoon of December 10, she took a nap but then was awakened by severe back pain between her shoulder blades, chest and abdominal pain, and dyspnea.  Dyspnea was a new symptom for her.  EMS was called and she was taken to the [facility] emergency department.  Here, ECG nonacute.  Initial troponin was normal but has subsequently risen to 0.29 following admission.  She did have recurrent nitrate responsive chest pain during our interview.  She underwent diagnostic catheterization this afternoon revealing severe, multivessel CAD as outlined above in [place].  In the setting of 90% dLM dzs, IABP was placed and she was transferred to ICU.  She will be transferred to [facility] for further eval.  Current Medications    Prior to Admission medications   Medication Sig Start Date Aveen Stansel Date Taking? Authorizing Provider  acetaminophen (TYLENOL) 325 MG tablet Take 2 tablets (650 mg total) by mouth every 6 (six) hours as needed for mild pain, fever or headache. Patient not taking: Reported on 12/21/2016 11/07/15   Nicholes Mango, MD  amLODipine (NORVASC) 10 MG tablet Take 10 mg by mouth daily.    [provider]  aspirin 81 MG tablet Take 81 mg by mouth daily.    [provider]  atorvastatin (LIPITOR) 10 MG tablet Take 10 mg by mouth daily.    [provider]  Calcium Carbonate-Vitamin D (CALCIUM 600 + D PO) Take 1 tablet by mouth daily. 600 mg/ 400 mg    [provider]  clopidogrel (PLAVIX) 75 MG tablet Take 75 mg by mouth daily.    [provider]  estrogens, conjugated, (PREMARIN) 0.3 MG tablet Take 0.3 mg by mouth daily. Take daily for 21 days then do not take for 7 days.    [provider]  furosemide (LASIX) 20 MG tablet Take 20 mg by  mouth daily.    [provider]  guaiFENesin-dextromethorphan (ROBITUSSIN DM) 100-10 MG/5ML syrup Take 10 mLs by mouth every 6 (six) hours as needed for cough. Patient not taking: Reported on 12/21/2016 11/07/15   Nicholes Mango, MD  HYDROcodone-acetaminophen (NORCO/VICODIN) 5-325 MG tablet Take 1-2 tablets by mouth every 4 (four) hours as needed for moderate pain. 12/22/16   Loletha Grayer, MD  isosorbide mononitrate (IMDUR) 30 MG 24 hr tablet TAKE 0.5 TABLETS (15 MG TOTAL) BY MOUTH DAILY. 12/21/16   Dunn, Ryan M, PA-C  KLOR-CON M20 20 MEQ tablet TAKE 1 TABLET (20 MEQ TOTAL) BY MOUTH 2 (TWO) TIMES DAILY. 09/08/16  Minna Merritts, MD  LORazepam (ATIVAN) 0.5 MG tablet Take 0.5 mg by mouth every 8 (eight) hours as needed for anxiety.     [provider]  losartan (COZAAR) 100 MG tablet Take 1 tablet (100 mg total) by mouth daily. 09/10/16 12/21/16  Minna Merritts, MD  metoprolol succinate (TOPROL-XL) 25 MG 24 hr tablet Take 3 tablets (75 mg total) by mouth daily. Take with or immediately following a meal. 12/23/16   Loletha Grayer, MD  morphine 2 MG/ML injection Inject 1 mL (2 mg total) into the vein every 2 (two) hours as needed (cp). 12/22/16   Loletha Grayer, MD  Multiple Vitamin (MULTIVITAMIN) tablet Take 1 tablet by mouth daily.    [provider]  nitroGLYCERIN (NITROSTAT) 0.4 MG SL tablet PLACE 1 TABLET (0.4 MG TOTAL) UNDER THE TONGUE EVERY 5 (FIVE) MINUTES AS NEEDED FOR CHEST PAIN. 10/23/16   Minna Merritts, MD  omeprazole (PRILOSEC) 20 MG capsule Take 20 mg by mouth daily.    [provider]  ondansetron (ZOFRAN) 4 MG tablet Take 1 tablet (4 mg total) by mouth every 6 (six) hours as needed for nausea. 12/22/16   Loletha Grayer, MD  pantoprazole (PROTONIX) 40 MG tablet Take 40 mg by mouth daily.    [provider]  ranitidine (ZANTAC) 150 MG tablet Take 1 tablet (150 mg total) by mouth 2 (two) times daily. Patient not taking: Reported  on 12/21/2016 07/08/16 07/08/17  Merlyn Lot, MD      Family History         Family History  Problem Relation Age of Onset  . Diabetes Mellitus I Mother   . CAD Mother   . CAD Father   . Diabetes Mellitus I Sister   . Diabetes Mellitus I Brother   . Diabetes Mellitus I Sister     Social History    Social History        Socioeconomic History  . Marital status: Divorced    Spouse name: Not on file  . Number of children: Not on file  . Years of education: Not on file  . Highest education level: Not on file  Social Needs  . Financial resource strain: Not on file  . Food insecurity - worry: Not on file  . Food insecurity - inability: Not on file  . Transportation needs - medical: Not on file  . Transportation needs - non-medical: Not on file  Occupational History  . Occupation: retired  Tobacco Use  . Smoking status: Never Smoker  . Smokeless tobacco: Never Used  Substance and Sexual Activity  . Alcohol use: No  . Drug use: No  . Sexual activity: Not on file  Other Topics Concern  . Not on file  Social History Narrative  . Not on file     Review of Systems    General:  No chills, fever, night sweats or weight changes.  Cardiovascular:  +++ chest pain, +++ assoc dyspnea, no edema, orthopnea, palpitations, paroxysmal nocturnal dyspnea. Dermatological: No rash, lesions/masses Respiratory: No cough, +++ dyspnea Urologic: No hematuria, dysuria Abdominal:   No nausea, vomiting, +++ diarrhea, no bright red blood per rectum, melena, or hematemesis Neurologic:  No visual changes, wkns, changes in mental status. All other systems reviewed and are otherwise negative except as noted above.  Physical Exam     97.8 F (36.6 C)  70  68  22 Abnormal   167/114 Abnormal   Lying  100 %  Nasal Cannula  179 lb 7.3 oz (81.4 kg)   General: Pleasant, NAD Psych: Normal affect. Neuro: Alert and oriented X 3. Moves all extremities spontaneously. HEENT: Normal     Neck: Supple without bruits or JVD. Lungs:  Resp regular and unlabored, CTA. Heart: RRR no s3, s4, or murmurs. Abdomen: Soft, non-tender, non-distended, BS + x 4.  Extremities: No clubbing, cyanosis or edema. DP/PT/Radials 2+ and equal bilaterally.  Labs     RecentLabs(last2labs)       Recent Labs    12/21/16 1812 12/21/16 2251 12/22/16 0437  TROPONINI <0.03 0.04* 0.29*     RecentLabs       Lab Results  Component Value Date   WBC 6.5 12/21/2016   HGB 12.9 12/21/2016   HCT 38.6 12/21/2016   MCV 87.7 12/21/2016   PLT 178 12/21/2016      LastLabs     Recent Labs  Lab 12/21/16 1812  NA 137  K 4.0  CL 106  CO2 24  BUN 13  CREATININE 0.85  CALCIUM 9.0  PROT 6.7  BILITOT 1.6*  ALKPHOS 104  ALT 17  AST 29  GLUCOSE 147*     RecentLabs       Lab Results  Component Value Date   CHOL 121 12/22/2016   HDL 48 12/22/2016   LDLCALC 47 12/22/2016   TRIG 129 12/22/2016       Radiology Studies     ImagingResults  Ct Abdomen Pelvis W Contrast  Addendum Date: 12/21/2016   ADDENDUM REPORT: 12/21/2016 19:52 ADDENDUM: This is an addendum to the original report. The pneumobilia identified on today's study was present on study from 07/08/2016 but is a new finding when compared with 10/29/2011. Therefore, this may reflect a benign process such as pneumobilia due to instrumentation. Further. in the absence of signs or symptoms of infection this makes the original consideration for cholangitis less likely. The filling defects discussed in today's report however appear to be a new finding from 10/29/2011. I cannot confidently confirm the these were present on study from 07/08/2016. And MRCP may be helpful to confirm presence of choledocholithiasis. These findings were discussed with Dr. Merlyn Lot at 12/21/2016, 7:51 p.m. Electronically Signed   By: Kerby Moors M.D.   On: 12/21/2016 19:52   Result Date: 12/21/2016 CLINICAL DATA:  Abdominal  pain and fever. EXAM: CT ABDOMEN AND PELVIS WITH CONTRAST TECHNIQUE: Multidetector CT imaging of the abdomen and pelvis was performed using the standard protocol following bolus administration of intravenous contrast. CONTRAST:  131mL ISOVUE-300 IOPAMIDOL (ISOVUE-300) INJECTION 61% COMPARISON:  07/08/2016 FINDINGS: Lower chest: There is a small right pleural effusion. Unchanged from previous exam. Hepatobiliary: No focal liver abnormality. Pneumobilia is identified status post cholecystectomy. This is new when compared with 07/08/2016 and 10/29/2011. Soft tissue attenuating filling defects within the common bile duct are identified which appear new from previous exam. Pancreas: Unremarkable. No pancreatic ductal dilatation or surrounding inflammatory changes. Spleen: Normal in size without focal abnormality. Adrenals/Urinary Tract: The adrenal glands appear normal. Unremarkable appearance of the kidneys. No mass or hydronephrosis. Urinary bladder is unremarkable. Stomach/Bowel: The stomach is normal. The small bowel loops have a normal course and caliber without obstruction. The appendix is not visualized. No secondary signs of acute appendicitis. Vascular/Lymphatic: Aortic atherosclerosis. Branch vessel aortic atherosclerotic calcifications noted. No aneurysm. No adenopathy within the abdomen or pelvis. Reproductive: Status post hysterectomy. No adnexal masses. Other: No free fluid or fluid collections. Musculoskeletal: Degenerative disc disease noted within the lumbar spine. IMPRESSION: 1.  Interval development of pneumobilia. There also several soft tissue attenuating filling defects within the common bile duct which may represent sequelae of inflammation/ infection or choledocholithiasis, this is new from 07/08/2016. In the setting of suspected infection and elevated bilirubin cholangitis cannot be excluded. 2. No intra-abdominal abscess identified. 3.  Aortic Atherosclerosis (ICD10-I70.0). 4. Chronic right  pleural effusion. Electronically Signed: By: Kerby Moors M.D. On: 12/21/2016 19:24   Dg Chest Portable 1 View  Result Date: 12/21/2016 CLINICAL DATA:  Epigastric pain EXAM: PORTABLE CHEST 1 VIEW COMPARISON:  07/08/2016 FINDINGS: Cardiac shadow is stable. Aortic calcifications are again seen. The lungs are well aerated. Postsurgical changes are noted on the right with mild volume loss. No acute infiltrate or sizable effusion is seen. No bony abnormality is noted. IMPRESSION: Postoperative changes on the right.  No acute abnormality seen. Electronically Signed   By: Inez Catalina M.D.   On: 12/21/2016 18:17     ECG & Cardiac Imaging    RSR, 70 PVCs, non-specific ST changes.  Assessment & Plan    1.  NSTEMI/CAD: Status post prior PTCA to LAD in 1986 followed by bare-metal stenting of the LAD and circumflex in 2001.  Low risk Myoview in December 2016 with normal LV function by echo in October 2017.  Admitted December 11 with a 75-month history of intermittent abdominal and chest discomfort.  She had a severe recurrent episode on December 10, associated with pain between her shoulder blades and dyspnea.  Initial troponin was normal however, this has subsequently risen to 0.29.  ECG nonacute.  Frequent PVCs on telemetry, though she has a history of this.  She had recurrent nitrate responsive chest pain during our interview.  Diag cath today revealed severe multivessel CAD including distal LM/ostial LAD dzs.  IABP placed in cath lab and now pending tx to [facility].  She did receive plavix load this AM.  Continue aspirin, statin, beta-blocker, ARB, and heparin therapy. To ICU @ [facility] today for CT surgical eval.  2.  Essential hypertension: Blood pressure elevated this morning and trended in the 140s-160s yesterday.  Heart rates in the 70s-80s with frequent PVCs.  Cont current meds and watch for hypotension.  3.  Hyperlipidemia: LDL 47.  Continue statin therapy.  4.  Abdominal pain: Patient reports a  long history of abdominal discomfort and diarrhea.  CT of the abdomen yesterday showed stable pneumobilia since CT in June 2018.  New finding of soft tissue attenuating filling defects within the common bile duct.  Will need outpt f/u.   Signed, Murray Hodgkins, NP 12/22/2016, 11:08 AM  For questions or updates, please contact   Please consult [email or web address] contactinfo under Cardiology/STEMI.  I have seen and examined the patient and agree with the findings and plan, as documented in the PA/NP's note, with the following additions/changes.  Ms. [name] is an 84 year old woman with history of CAD, hypertension, hyperlipidemia, TIA, chronic diarrhea, depression, anxiety, and atypical lung carcinoid, admitted with an STEMI after progressive chest pain for the last 6 months that became significantly worse in the last few days.  She has continued to have intermittent chest pain relieved with nitroglycerin in the setting of rising troponin (most recently 0.44).  He underwent cardiac catheterization today that demonstrated 90% distal LMCA stenosis involving the ostia of the LAD and LCx, as well as chronic total occlusion of the RCA.  Due to continued chest pain during the procedure, intra-aortic balloon pump was placed.  She is now chest pain-free.  Exam is  notable for a well-developed, well-nourished elderly woman, lying in bed.  Heart sounds are regular.  Lungs are clear anteriorly.  There is no lower extremity edema.  Echocardiogram, which I personally reviewed, is technically difficult but demonstrates preserved LVEF.  Patient is being transferred from [facility] to [facility] for further management of her critical multivessel coronary artery disease.  Dr. Tamala Julian Gershon Mussel [facility] MD) and I have reviewed the images together and feel that bypass is the best revascularization option.  The case has been discussed with Dr. Roxy Manns (T[facility]) as well.  If she is deemed a poor surgical candidate, further heart  team discussion will need to be pursued to discuss extremely high risk PCI with Impella support versus palliative care.  Clopidogrel has been discontinued, though the patient most recently received 300 mg this morning.  Platelet function assay is pending.  She is currently on aspirin and heparin.  We will continue current doses of metoprolol, losartan, and nitroglycerin infusion.  High intensity statin therapy has also been ordered.  The patient's DNR order will be maintained.  However, she has agreed to rescind this for interventional procedures, including CABG or PCI.  Nelva Bush, MD [facility] Pager: [phone]

## 2016-12-22 NOTE — Progress Notes (Signed)
ANTICOAGULATION CONSULT NOTE  Pharmacy Consult:  Heparin Indication:  ACS post cath, IABP  Allergies  Allergen Reactions  . Biaxin [Clarithromycin]     depressed  . Celecoxib Other (See Comments)    Reaction: Unknown  . Ciprofloxacin   . Codeine Other (See Comments)    Reaction: Unknown  . Hydromorphone   . Lisinopril     cough  . Macrobid [employer] Macro] Other (See Comments)    Reaction: Unknown  . Oxycodone Hcl Other (See Comments)    Reaction: Unknown  . Penicillins Other (See Comments)    Reaction: Unknown  . Prednisone   . Sertraline Hcl Other (See Comments)    Reaction: Unknown    Patient Measurements: Height: 5\' 6"  (167.6 cm) Weight: 171 lb 8.3 oz (77.8 kg) IBW/kg (Calculated) : 59.3 Heparin Dosing Weight: 78kg  Vital Signs: Temp: 98 F (36.7 C) (12/11 1830) Temp Source: Oral (12/11 1830) BP: 158/57 (12/11 1915) Pulse Rate: 88 (12/11 1915)  Labs: Recent Labs    12/21/16 1812 12/21/16 2251 12/22/16 0437 12/22/16 0552 12/22/16 1449  HGB 12.9  --   --   --   --   HCT 38.6  --   --   --   --   PLT 178  --   --   --   --   APTT  --   --   --  30  --   LABPROT  --   --   --  12.7  --   INR  --   --   --  0.96  --   CREATININE 0.85  --   --   --   --   TROPONINI <0.03 0.04* 0.29*  --  0.44*    Estimated Creatinine Clearance: 51.9 mL/min (by C-G formula based on SCr of 0.85 mg/dL).    Assessment: 6 YOF presented to [facility] with NSTEMI and was started on IV heparin at 900 units/hr.  She was taken to the cath lab and found to have severe 3 vessel disease.  IABP was placed in the cath lab.  Heparin infusion was resumed around 1530 at a reduced rate of 800 units/hr.  Heparin was infusing on arrival to [facility] and no interruption was noted by RN.   Patient has a hematoma at IV site.  MD and PA aware and are monitoring.  Pharmacy to continue IV heparin.  Previous CBC WNL.   Goal of Therapy:  Heparin level 0.2-0.5 units/mL Monitor platelets by  anticoagulation protocol: Yes    Plan:  Continue heparin gtt at 800 units/hr Check heparin level at 2330 Daily heparin level and CBC Monitor for hematoma expansion, s/sx of bleeding   Ranald Alessio D. Mina Marble, PharmD, BCPS Pager:  [phone] 12/22/2016, 8:22 PM

## 2016-12-22 NOTE — Progress Notes (Signed)
Patient ID: [name], female   DOB: [birth year], 83 y.o.   MRN: [id number]  [facility] PROGRESS NOTE  [name] MGQ:[id number] DOB: Jun 19, 1930 DOA: 12/21/2016 PCP: Lajean Manes, MD  HPI/Subjective: Patient with recurrent chest pain while cardiology was speaking with her.  Received a sublingual nitroglycerin and chest pain has subsided a bit.  The patient describes it as like an acid reflux in the epigastrium going up into the chest.  It has been waking her up every night for the past few nights.  Associated with shortness of breath and nausea and sweating.  Second troponin bumped up at 0.29.  Objective: Vitals:   12/22/16 0558 12/22/16 0851  BP: (!) 154/75 124/60  Pulse: 83 86  Resp: 16 16  Temp:  98.1 F (36.7 C)  SpO2: 99% 98%    Filed Weights   12/21/16 1806 12/21/16 2359  Weight: 77.1 kg (170 lb) 77.8 kg (171 lb 9.6 oz)    ROS: Review of Systems  Constitutional: Negative for chills and fever.  Eyes: Negative for blurred vision.  Respiratory: Positive for shortness of breath. Negative for cough.   Cardiovascular: Positive for chest pain.  Gastrointestinal: Positive for nausea. Negative for abdominal pain, constipation, diarrhea and vomiting.  Genitourinary: Negative for dysuria.  Musculoskeletal: Negative for joint pain.  Neurological: Negative for dizziness and headaches.   Exam: Physical Exam  Constitutional: She is oriented to person, place, and time.  HENT:  Nose: No mucosal edema.  Mouth/Throat: No oropharyngeal exudate or posterior oropharyngeal edema.  Eyes: Conjunctivae, EOM and lids are normal. Pupils are equal, round, and reactive to light.  Neck: No JVD present. Carotid bruit is not present. No edema present. No thyroid mass and no thyromegaly present.  Cardiovascular: S1 normal and S2 normal. Exam reveals no gallop.  No murmur heard. Pulses:      Dorsalis pedis pulses are 2+ on the right side, and 2+ on the left side.  Respiratory: No  respiratory distress. She has no wheezes. She has no rhonchi. She has no rales.  GI: Soft. Bowel sounds are normal. There is tenderness in the right lower quadrant and left lower quadrant.  Musculoskeletal:       Right ankle: She exhibits swelling.       Left ankle: She exhibits swelling.  Lymphadenopathy:    She has no cervical adenopathy.  Neurological: She is alert and oriented to person, place, and time. No cranial nerve deficit.  Skin: Skin is warm. No rash noted. Nails show no clubbing.  Psychiatric: She has a normal mood and affect.      Data Reviewed: Basic Metabolic Panel: Recent Labs  Lab 12/21/16 1812  NA 137  K 4.0  CL 106  CO2 24  GLUCOSE 147*  BUN 13  CREATININE 0.85  CALCIUM 9.0   Liver Function Tests: Recent Labs  Lab 12/21/16 1812  AST 29  ALT 17  ALKPHOS 104  BILITOT 1.6*  PROT 6.7  ALBUMIN 3.7   Recent Labs  Lab 12/21/16 1812  LIPASE 30   CBC: Recent Labs  Lab 12/21/16 1812  WBC 6.5  NEUTROABS 4.9  HGB 12.9  HCT 38.6  MCV 87.7  PLT 178   Cardiac Enzymes: Recent Labs  Lab 12/21/16 1812 12/21/16 2251 12/22/16 0437  TROPONINI <0.03 0.04* 0.29*     Studies: Ct Abdomen Pelvis W Contrast  Addendum Date: 12/21/2016   ADDENDUM REPORT: 12/21/2016 19:52 ADDENDUM: This is an addendum to the original report. The pneumobilia  identified on today's study was present on study from 07/08/2016 but is a new finding when compared with 10/29/2011. Therefore, this may reflect a benign process such as pneumobilia due to instrumentation. Further. in the absence of signs or symptoms of infection this makes the original consideration for cholangitis less likely. The filling defects discussed in today's report however appear to be a new finding from 10/29/2011. I cannot confidently confirm the these were present on study from 07/08/2016. And MRCP may be helpful to confirm presence of choledocholithiasis. These findings were discussed with Dr. Merlyn Lot at 12/21/2016, 7:51 p.m. Electronically Signed   By: Kerby Moors M.D.   On: 12/21/2016 19:52   Result Date: 12/21/2016 CLINICAL DATA:  Abdominal pain and fever. EXAM: CT ABDOMEN AND PELVIS WITH CONTRAST TECHNIQUE: Multidetector CT imaging of the abdomen and pelvis was performed using the standard protocol following bolus administration of intravenous contrast. CONTRAST:  124mL ISOVUE-300 IOPAMIDOL (ISOVUE-300) INJECTION 61% COMPARISON:  07/08/2016 FINDINGS: Lower chest: There is a small right pleural effusion. Unchanged from previous exam. Hepatobiliary: No focal liver abnormality. Pneumobilia is identified status post cholecystectomy. This is new when compared with 07/08/2016 and 10/29/2011. Soft tissue attenuating filling defects within the common bile duct are identified which appear new from previous exam. Pancreas: Unremarkable. No pancreatic ductal dilatation or surrounding inflammatory changes. Spleen: Normal in size without focal abnormality. Adrenals/Urinary Tract: The adrenal glands appear normal. Unremarkable appearance of the kidneys. No mass or hydronephrosis. Urinary bladder is unremarkable. Stomach/Bowel: The stomach is normal. The small bowel loops have a normal course and caliber without obstruction. The appendix is not visualized. No secondary signs of acute appendicitis. Vascular/Lymphatic: Aortic atherosclerosis. Branch vessel aortic atherosclerotic calcifications noted. No aneurysm. No adenopathy within the abdomen or pelvis. Reproductive: Status post hysterectomy. No adnexal masses. Other: No free fluid or fluid collections. Musculoskeletal: Degenerative disc disease noted within the lumbar spine. IMPRESSION: 1. Interval development of pneumobilia. There also several soft tissue attenuating filling defects within the common bile duct which may represent sequelae of inflammation/ infection or choledocholithiasis, this is new from 07/08/2016. In the setting of suspected infection  and elevated bilirubin cholangitis cannot be excluded. 2. No intra-abdominal abscess identified. 3.  Aortic Atherosclerosis (ICD10-I70.0). 4. Chronic right pleural effusion. Electronically Signed: By: Kerby Moors M.D. On: 12/21/2016 19:24   Dg Chest Portable 1 View  Result Date: 12/21/2016 CLINICAL DATA:  Epigastric pain EXAM: PORTABLE CHEST 1 VIEW COMPARISON:  07/08/2016 FINDINGS: Cardiac shadow is stable. Aortic calcifications are again seen. The lungs are well aerated. Postsurgical changes are noted on the right with mild volume loss. No acute infiltrate or sizable effusion is seen. No bony abnormality is noted. IMPRESSION: Postoperative changes on the right.  No acute abnormality seen. Electronically Signed   By: Inez Catalina M.D.   On: 12/21/2016 18:17    Scheduled Meds: . aspirin  81 mg Oral Daily  . [START ON 12/23/2016] aspirin  81 mg Oral Pre-Cath  . atorvastatin  10 mg Oral Daily  . clopidogrel  300 mg Oral Once  . [START ON 12/23/2016] clopidogrel  75 mg Oral Daily  . LORazepam  0.5 mg Oral QHS  . LORazepam  0.5 mg Oral Once  . losartan  100 mg Oral Daily  . metoprolol succinate  50 mg Oral Daily  . pantoprazole (PROTONIX) IV  40 mg Intravenous Q24H  . sodium chloride flush  3 mL Intravenous Q12H  . sodium chloride flush  3 mL Intravenous Q12H  Continuous Infusions: . sodium chloride    . sodium chloride    . [START ON 12/23/2016] sodium chloride     Followed by  . [START ON 12/23/2016] sodium chloride    . famotidine (PEPCID) IV    . heparin 900 Units/hr (12/22/16 3299)    Assessment/Plan:  1. Chest pain and elevation of the troponin concerning for NSTEMI.  Case discussed with cardiology team and she will be scheduled for cardiac catheterization today.  Patient on aspirin, atorvastatin, heparin drip, metoprolol. 2. Chronic pneumobilia in a patient that has had a prior cholecystectomy. 3. GERD.  Started Protonix and Pepcid 4. Essential hypertension.  Continue  losartan and metoprolol 5. Hyperlipidemia unspecified.  LDL 47 on Lipitor 6. History of TIA on aspirin 7. Anxiety.  Patient states she takes Ativan at night.  Code Status:     Code Status Orders  (From admission, onward)        Start     Ordered   12/21/16 2345  Do not attempt resuscitation (DNR)  Continuous    Question Answer Comment  In the event of cardiac or respiratory ARREST Do not call a "code blue"   In the event of cardiac or respiratory ARREST Do not perform Intubation, CPR, defibrillation or ACLS   In the event of cardiac or respiratory ARREST Use medication by any route, position, wound care, and other measures to relive pain and suffering. May use oxygen, suction and manual treatment of airway obstruction as needed for comfort.      12/21/16 2344    Code Status History    Date Active Date Inactive Code Status Order ID Comments User Context   11/07/2015 00:49 11/07/2015 18:05 Full Code [id number]  Harvie Bridge, DO Inpatient   12/28/2014 09:16 12/30/2014 16:33 Full Code [id number]  Fritzi Mandes, MD Inpatient    Advance Directive Documentation     Most Recent Value  Type of Advance Directive  Healthcare Power of Attorney, Living will  Pre-existing out of facility DNR order (yellow form or pink MOST form)  No data  "MOST" Form in Place?  No data     Family Communication: Cardiology will notify family about procedure today Disposition Plan: Depending on procedure results  Consultants:  Cardiology  Procedures:  Cardiac cath  Time spent: 28 minutes  [place]

## 2016-12-22 NOTE — Progress Notes (Signed)
Hematoma noted on assessment at 1500. At this time patient complaining of pain at hematoma/IV site. IV removed and replaced further down. Ice placed on hematoma. Will continue to monitor.

## 2016-12-22 NOTE — Progress Notes (Signed)
[name] for Heparin Indication: chest pain/ACS  Allergies  Allergen Reactions  . Biaxin [Clarithromycin]     depressed  . Celecoxib Other (See Comments)    Reaction: Unknown  . Ciprofloxacin   . Codeine Other (See Comments)    Reaction: Unknown  . Hydromorphone   . Lisinopril     cough  . Macrobid [employer] Macro] Other (See Comments)    Reaction: Unknown  . Oxycodone Hcl Other (See Comments)    Reaction: Unknown  . Penicillins Other (See Comments)    Reaction: Unknown  . Prednisone   . Sertraline Hcl Other (See Comments)    Reaction: Unknown    Patient Measurements: Height: 5\' 6"  (167.6 cm) Weight: 179 lb 7.3 oz (81.4 kg) IBW/kg (Calculated) : 59.3 Heparin Dosing Weight: 76 kg  Vital Signs: Temp: 97.8 F (36.6 C) (12/11 1400) Temp Source: Oral (12/11 1400) BP: 167/114 (12/11 1400) Pulse Rate: 70 (12/11 1400)  Labs: Recent Labs    12/21/16 1812 12/21/16 2251 12/22/16 0437 12/22/16 0552  HGB 12.9  --   --   --   HCT 38.6  --   --   --   PLT 178  --   --   --   APTT  --   --   --  30  LABPROT  --   --   --  12.7  INR  --   --   --  0.96  CREATININE 0.85  --   --   --   TROPONINI <0.03 0.04* 0.29*  --     Estimated Creatinine Clearance: 53 mL/min (by C-G formula based on SCr of 0.85 mg/dL).   Medical History: Past Medical History:  Diagnosis Date  . Anxiety   . Atypical carcinoid lung tumor ([place])   . CAD (coronary artery disease)    a. 1986: s/p PTCA of bifurcation lesion in LAD;  b. 2001: s/p BMS to LAD & LCX;  c. 04/2006 Cath: diff CAD w/o critical narrowing, nl EF;  d. 12/2014 MV: inflat ST dep, mild basal inf ischemia, EF 55-65%-->Low risk.  . Cancer ([place])   . Carotid arterial disease ([place])    a. 08/2015 Carotid U/S: < 39% bilat ICA stenosis.  . Carotid disease, bilateral ([place])    a. RICA 6-75%, LICA 91-63%  . Cataracts, bilateral   . Cholelithiases   . Chronic diarrhea   . Depression   .  Gilbert's syndrome   . History of echocardiogram    a. 06/2010 Echo: EF 60-65%, mild AI/MR; b. 10/2015 Echo: EF 60-65%, no rwma, mild MR, nl RV fxn.  Marland Kitchen HTN (hypertension)   . Hyperlipidemia   . Impaired fasting blood sugar   . Keloid    mid sternum  . Macular degeneration   . Osteoarthritis   . Positional vertigo    benign  . Symptoms, such as flushing, sleeplessness, headache, lack of concentration, associated with the menopause   . TIA (transient ischemic attack)    Assessment: 83 y/o F with NSTEMI s/p cath with IABP to be evaluated for CABG.   Goal: Heparin level 0.2-0.5 units/ml Monitor platelets by anticoagulation protocol: Yes   Plan:  Start heparin infusion at 800 units/hr Check anti-Xa level in 8 hours and daily while on heparin Continue to monitor H&H and platelets  Ulice Dash D 12/22/2016,2:29 PM

## 2016-12-22 NOTE — Progress Notes (Addendum)
ANTICOAGULATION CONSULT NOTE - Initial Consult  Pharmacy Consult for heparin drip Indication: ACS/STEMI/elevated troponin  Allergies  Allergen Reactions  . Biaxin [Clarithromycin]     depressed  . Celecoxib Other (See Comments)    Reaction: Unknown  . Ciprofloxacin   . Codeine Other (See Comments)    Reaction: Unknown  . Hydromorphone   . Lisinopril     cough  . Macrobid [employer] Macro] Other (See Comments)    Reaction: Unknown  . Oxycodone Hcl Other (See Comments)    Reaction: Unknown  . Penicillins Other (See Comments)    Reaction: Unknown  . Prednisone   . Sertraline Hcl Other (See Comments)    Reaction: Unknown    Patient Measurements: Height: 5\' 7"  (170.2 cm) Weight: 171 lb 9.6 oz (77.8 kg) IBW/kg (Calculated) : 61.6 Heparin Dosing Weight: 78kg  Vital Signs: Temp: 98 F (36.7 C) (12/11 0405) Temp Source: Oral (12/11 0405) BP: 154/65 (12/11 0405) Pulse Rate: 75 (12/11 0405)  Labs: Recent Labs    12/21/16 1812 12/21/16 2251 12/22/16 0437  HGB 12.9  --   --   HCT 38.6  --   --   PLT 178  --   --   CREATININE 0.85  --   --   TROPONINI <0.03 0.04* 0.29*    Estimated Creatinine Clearance: 53 mL/min (by C-G formula based on SCr of 0.85 mg/dL).   Medical History: Past Medical History:  Diagnosis Date  . Anxiety   . 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 CAD (coronary artery disease)    a. 1986: s/p PTCA of bifurcation lesion in LAD;  b. 2001: s/p BMS to LAD & LCX;  c. 04/2006 Cath: diff CAD w/o critical narrowing, nl EF;  d. 06/2010 Echo: EF 60-65%, mild AI/MR.  . Cancer ([place])   . Carotid disease, bilateral ([place])    a. RICA 6-29%, LICA 47-65%  . Cataracts, bilateral   . Cholelithiases   . Depression   . Gilbert's syndrome   . HTN (hypertension)   . Hyperglycemia    mild  . Hyperlipidemia   . Impaired fasting blood sugar   . Keloid    mid sternum  . Macular degeneration   . Osteoarthritis   . Positional vertigo    benign  .  Symptoms, such as flushing, sleeplessness, headache, lack of concentration, associated with the menopause   . TIA (transient ischemic attack)     Medications:  No anticoagulation in PTA meds.  Assessment: Troponin trending upward  Goal of Therapy:  Heparin level 0.3-0.7 units/ml Monitor platelets by anticoagulation protocol: Yes   Plan:  4000 unit bolus and initial rate of 950 units/hr. First heparin level 8 hours after start of infusion.  Lynnita Somma S 12/22/2016,5:45 AM

## 2016-12-22 NOTE — Progress Notes (Signed)
*  PRELIMINARY RESULTS* Echocardiogram 2D Echocardiogram has been performed.  [name] 12/22/2016, 2:39 PM

## 2016-12-22 NOTE — Care Management Obs Status (Signed)
[place] NOTIFICATION   Patient Details  Name: REVA PINKLEY MRN: [id number] Date of Birth: 1930-09-16   Medicare Observation Status Notification Given:  Yes  Notice signed, one given to patient and the other to HIM for scanning  Katrina Stack, RN 12/22/2016, 9:50 AM

## 2016-12-23 ENCOUNTER — Inpatient Hospital Stay (HOSPITAL_COMMUNITY): Payer: Medicare Other

## 2016-12-23 ENCOUNTER — Inpatient Hospital Stay (HOSPITAL_COMMUNITY)
Admission: EM | Disposition: A | Discharge status: Skilled Nursing Facility | Payer: Self-pay | Source: Other Acute Inpatient Hospital | Attending: Thoracic Surgery (Cardiothoracic Vascular Surgery)

## 2016-12-23 ENCOUNTER — Encounter (HOSPITAL_COMMUNITY): Payer: Self-pay | Admitting: *Deleted

## 2016-12-23 ENCOUNTER — Inpatient Hospital Stay (HOSPITAL_COMMUNITY): Payer: Medicare Other | Admitting: Certified Registered Nurse Anesthetist

## 2016-12-23 ENCOUNTER — Ambulatory Visit: Payer: Self-pay | Admitting: Nurse Practitioner

## 2016-12-23 DIAGNOSIS — I214 Non-ST elevation (NSTEMI) myocardial infarction: Secondary | ICD-10-CM

## 2016-12-23 DIAGNOSIS — Z9889 Other specified postprocedural states: Secondary | ICD-10-CM

## 2016-12-23 DIAGNOSIS — I25110 Atherosclerotic heart disease of native coronary artery with unstable angina pectoris: Principal | ICD-10-CM

## 2016-12-23 DIAGNOSIS — Z951 Presence of aortocoronary bypass graft: Secondary | ICD-10-CM

## 2016-12-23 HISTORY — DX: Presence of aortocoronary bypass graft: Z95.1

## 2016-12-23 HISTORY — PX: CORONARY ARTERY BYPASS GRAFT: SHX141

## 2016-12-23 HISTORY — PX: TEE WITHOUT CARDIOVERSION: SHX5443

## 2016-12-23 LAB — POCT I-STAT, CHEM 8
BUN: 8 mg/dL (ref 6–20)
BUN: 7 mg/dL (ref 6–20)
BUN: 7 mg/dL (ref 6–20)
BUN: 8 mg/dL (ref 6–20)
BUN: 8 mg/dL (ref 6–20)
CALCIUM ION: 1.18 mmol/L (ref 1.15–1.40)
CALCIUM ION: 1.1 mmol/L — AB (ref 1.15–1.40)
CHLORIDE: 103 mmol/L (ref 101–111)
CHLORIDE: 103 mmol/L (ref 101–111)
CHLORIDE: 102 mmol/L (ref 101–111)
CREATININE: 0.5 mg/dL (ref 0.44–1.00)
CREATININE: 0.4 mg/dL — AB (ref 0.44–1.00)
Calcium, Ion: 1.23 mmol/L (ref 1.15–1.40)
Calcium, Ion: 0.98 mmol/L — ABNORMAL LOW (ref 1.15–1.40)
Calcium, Ion: 1.07 mmol/L — ABNORMAL LOW (ref 1.15–1.40)
Chloride: 103 mmol/L (ref 101–111)
Chloride: 101 mmol/L (ref 101–111)
Creatinine, Ser: 0.6 mg/dL (ref 0.44–1.00)
Creatinine, Ser: 0.5 mg/dL (ref 0.44–1.00)
Creatinine, Ser: 0.4 mg/dL — ABNORMAL LOW (ref 0.44–1.00)
GLUCOSE: 97 mg/dL (ref 65–99)
GLUCOSE: 110 mg/dL — AB (ref 65–99)
Glucose, Bld: 86 mg/dL (ref 65–99)
Glucose, Bld: 88 mg/dL (ref 65–99)
Glucose, Bld: 131 mg/dL — ABNORMAL HIGH (ref 65–99)
HCT: 27 % — ABNORMAL LOW (ref 36.0–46.0)
HCT: 21 % — ABNORMAL LOW (ref 36.0–46.0)
HEMATOCRIT: 29 % — AB (ref 36.0–46.0)
HEMATOCRIT: 20 % — AB (ref 36.0–46.0)
HEMATOCRIT: 25 % — AB (ref 36.0–46.0)
HEMOGLOBIN: 9.9 g/dL — AB (ref 12.0–15.0)
HEMOGLOBIN: 6.8 g/dL — AB (ref 12.0–15.0)
HEMOGLOBIN: 9.2 g/dL — AB (ref 12.0–15.0)
HEMOGLOBIN: 7.1 g/dL — AB (ref 12.0–15.0)
Hemoglobin: 8.5 g/dL — ABNORMAL LOW (ref 12.0–15.0)
POTASSIUM: 3.7 mmol/L (ref 3.5–5.1)
POTASSIUM: 3.6 mmol/L (ref 3.5–5.1)
POTASSIUM: 3.9 mmol/L (ref 3.5–5.1)
POTASSIUM: 4.1 mmol/L (ref 3.5–5.1)
Potassium: 3.8 mmol/L (ref 3.5–5.1)
SODIUM: 139 mmol/L (ref 135–145)
SODIUM: 139 mmol/L (ref 135–145)
SODIUM: 138 mmol/L (ref 135–145)
Sodium: 140 mmol/L (ref 135–145)
Sodium: 138 mmol/L (ref 135–145)
TCO2: 25 mmol/L (ref 22–32)
TCO2: 23 mmol/L (ref 22–32)
TCO2: 23 mmol/L (ref 22–32)
TCO2: 24 mmol/L (ref 22–32)
TCO2: 25 mmol/L (ref 22–32)

## 2016-12-23 LAB — ABO/RH: ABO/RH(D): O POS

## 2016-12-23 LAB — CBC
HCT: 36 % (ref 36.0–46.0)
HEMATOCRIT: 24.3 % — AB (ref 36.0–46.0)
HEMOGLOBIN: 11.8 g/dL — AB (ref 12.0–15.0)
Hemoglobin: 8.2 g/dL — ABNORMAL LOW (ref 12.0–15.0)
MCH: 29.2 pg (ref 26.0–34.0)
MCH: 29.5 pg (ref 26.0–34.0)
MCHC: 32.8 g/dL (ref 30.0–36.0)
MCHC: 33.7 g/dL (ref 30.0–36.0)
MCV: 89.1 fL (ref 78.0–100.0)
MCV: 87.4 fL (ref 78.0–100.0)
PLATELETS: 149 10*3/uL — AB (ref 150–400)
Platelets: 93 10*3/uL — ABNORMAL LOW (ref 150–400)
RBC: 4.04 MIL/uL (ref 3.87–5.11)
RBC: 2.78 MIL/uL — ABNORMAL LOW (ref 3.87–5.11)
RDW: 13.5 % (ref 11.5–15.5)
RDW: 13.1 % (ref 11.5–15.5)
WBC: 6.1 10*3/uL (ref 4.0–10.5)
WBC: 9 10*3/uL (ref 4.0–10.5)

## 2016-12-23 LAB — COMPREHENSIVE METABOLIC PANEL
ALBUMIN: 3 g/dL — AB (ref 3.5–5.0)
ALT: 15 U/L (ref 14–54)
ANION GAP: 10 (ref 5–15)
AST: 23 U/L (ref 15–41)
Alkaline Phosphatase: 82 U/L (ref 38–126)
BILIRUBIN TOTAL: 2 mg/dL — AB (ref 0.3–1.2)
BUN: 8 mg/dL (ref 6–20)
CO2: 23 mmol/L (ref 22–32)
Calcium: 8.5 mg/dL — ABNORMAL LOW (ref 8.9–10.3)
Chloride: 105 mmol/L (ref 101–111)
Creatinine, Ser: 0.78 mg/dL (ref 0.44–1.00)
GFR calc Af Amer: >60 mL/min (ref >60)
Glucose, Bld: 87 mg/dL (ref 65–99)
POTASSIUM: 3.2 mmol/L — AB (ref 3.5–5.1)
Sodium: 138 mmol/L (ref 135–145)
TOTAL PROTEIN: 5.7 g/dL — AB (ref 6.5–8.1)

## 2016-12-23 LAB — POCT I-STAT 3, ART BLOOD GAS (G3+)
ACID-BASE DEFICIT: 4 mmol/L — AB (ref 0.0–2.0)
ACID-BASE DEFICIT: 3 mmol/L — AB (ref 0.0–2.0)
ACID-BASE DEFICIT: 5 mmol/L — AB (ref 0.0–2.0)
BICARBONATE: 21.7 mmol/L (ref 20.0–28.0)
BICARBONATE: 21.7 mmol/L (ref 20.0–28.0)
Bicarbonate: 18.8 mmol/L — ABNORMAL LOW (ref 20.0–28.0)
O2 SAT: 99 %
O2 Saturation: 100 %
O2 Saturation: 100 %
PH ART: 7.405 (ref 7.350–7.450)
TCO2: 23 mmol/L (ref 22–32)
TCO2: 23 mmol/L (ref 22–32)
TCO2: 20 mmol/L — ABNORMAL LOW (ref 22–32)
pCO2 arterial: 41.4 mmHg (ref 32.0–48.0)
pCO2 arterial: 34.7 mmHg (ref 32.0–48.0)
pCO2 arterial: 26.6 mmHg — ABNORMAL LOW (ref 32.0–48.0)
pH, Arterial: 7.326 — ABNORMAL LOW (ref 7.350–7.450)
pH, Arterial: 7.454 — ABNORMAL HIGH (ref 7.350–7.450)
pO2, Arterial: 128 mmHg — ABNORMAL HIGH (ref 83.0–108.0)
pO2, Arterial: 378 mmHg — ABNORMAL HIGH (ref 83.0–108.0)
pO2, Arterial: 229 mmHg — ABNORMAL HIGH (ref 83.0–108.0)

## 2016-12-23 LAB — URINALYSIS, ROUTINE W REFLEX MICROSCOPIC
BILIRUBIN URINE: NEGATIVE
Bacteria, UA: NONE SEEN
GLUCOSE, UA: NEGATIVE mg/dL
Ketones, ur: 20 mg/dL — AB
Nitrite: NEGATIVE
PH: 5 (ref 5.0–8.0)
Protein, ur: NEGATIVE mg/dL
SPECIFIC GRAVITY, URINE: 1.029 (ref 1.005–1.030)

## 2016-12-23 LAB — POCT I-STAT 4, (NA,K, GLUC, HGB,HCT)
GLUCOSE: 124 mg/dL — AB (ref 65–99)
HEMATOCRIT: 20 % — AB (ref 36.0–46.0)
Hemoglobin: 6.8 g/dL — CL (ref 12.0–15.0)
Potassium: 3.8 mmol/L (ref 3.5–5.1)
Sodium: 141 mmol/L (ref 135–145)

## 2016-12-23 LAB — HEMOGLOBIN AND HEMATOCRIT, BLOOD
HCT: 23.3 % — ABNORMAL LOW (ref 36.0–46.0)
HEMOGLOBIN: 7.8 g/dL — AB (ref 12.0–15.0)

## 2016-12-23 LAB — SURGICAL PCR SCREEN
MRSA, PCR: NEGATIVE
Staphylococcus aureus: NEGATIVE

## 2016-12-23 LAB — PROTIME-INR
INR: 1.74
Prothrombin Time: 20.2 seconds — ABNORMAL HIGH (ref 11.4–15.2)

## 2016-12-23 LAB — PLATELET COUNT: Platelets: 156 10*3/uL (ref 150–400)

## 2016-12-23 LAB — HEMOGLOBIN A1C
Hgb A1c MFr Bld: 5.6 % (ref 4.8–5.6)
MEAN PLASMA GLUCOSE: 114.02 mg/dL

## 2016-12-23 LAB — PREPARE RBC (CROSSMATCH)

## 2016-12-23 LAB — APTT: aPTT: 34 seconds (ref 24–36)

## 2016-12-23 LAB — GLUCOSE, CAPILLARY
GLUCOSE-CAPILLARY: 124 mg/dL — AB (ref 65–99)
GLUCOSE-CAPILLARY: 138 mg/dL — AB (ref 65–99)
GLUCOSE-CAPILLARY: 127 mg/dL — AB (ref 65–99)
Glucose-Capillary: 138 mg/dL — ABNORMAL HIGH (ref 65–99)

## 2016-12-23 LAB — HEPARIN LEVEL (UNFRACTIONATED): HEPARIN UNFRACTIONATED: 0.26 [IU]/mL — AB (ref 0.30–0.70)

## 2016-12-23 SURGERY — CORONARY ARTERY BYPASS GRAFTING (CABG)
Anesthesia: General | Site: Chest

## 2016-12-23 MED ORDER — BISACODYL 10 MG RE SUPP: 10.0000 mg | Freq: Every day | RECTAL | Status: DC

## 2016-12-23 MED ORDER — SODIUM CHLORIDE 0.9 % IV SOLN
Freq: Once | INTRAVENOUS | Status: AC
  Administered 2016-12-23: 22:00:00 via INTRAVENOUS

## 2016-12-23 MED ORDER — MIDAZOLAM HCL 10 MG/2ML IJ SOLN
INTRAMUSCULAR | Status: AC
  Filled 2016-12-23: qty 2

## 2016-12-23 MED ORDER — DEXTROSE 5 % IV SOLN
1.5000 g | Freq: Two times a day (BID) | INTRAVENOUS | Status: AC
  Administered 2016-12-24 – 2016-12-25 (×4): 1.5 g via INTRAVENOUS
  Filled 2016-12-23 (×4): qty 1.5

## 2016-12-23 MED ORDER — ACETAMINOPHEN 650 MG RE SUPP
650.0000 mg | Freq: Once | RECTAL | Status: AC
  Administered 2016-12-23: 650 mg via RECTAL

## 2016-12-23 MED ORDER — CHLORHEXIDINE GLUCONATE 0.12% ORAL RINSE (MEDLINE KIT)
15.0000 mL | Freq: Two times a day (BID) | OROMUCOSAL | Status: DC
  Administered 2016-12-24 – 2016-12-26 (×6): 15 mL via OROMUCOSAL

## 2016-12-23 MED ORDER — PANTOPRAZOLE SODIUM 40 MG PO TBEC: 40.0000 mg | DELAYED_RELEASE_TABLET | Freq: Every day | ORAL | Status: DC

## 2016-12-23 MED ORDER — SODIUM CHLORIDE 0.9 % IV SOLN: INTRAVENOUS | Status: DC

## 2016-12-23 MED ORDER — LACTATED RINGERS IV SOLN: 500.0000 mL | Freq: Once | INTRAVENOUS | Status: DC | PRN

## 2016-12-23 MED ORDER — TRAMADOL HCL 50 MG PO TABS
50.0000 mg | ORAL_TABLET | ORAL | Status: DC | PRN
  Filled 2016-12-23: qty 2

## 2016-12-23 MED ORDER — PROTAMINE SULFATE 10 MG/ML IV SOLN
INTRAVENOUS | Status: AC
  Filled 2016-12-23: qty 25

## 2016-12-23 MED ORDER — CHLORHEXIDINE GLUCONATE CLOTH 2 % EX PADS
6.0000 | MEDICATED_PAD | Freq: Once | CUTANEOUS | Status: AC
  Administered 2016-12-23: 6 via TOPICAL

## 2016-12-23 MED ORDER — ASPIRIN EC 325 MG PO TBEC
325.0000 mg | DELAYED_RELEASE_TABLET | Freq: Every day | ORAL | Status: DC
  Administered 2016-12-24: 325 mg via ORAL
  Filled 2016-12-23: qty 1

## 2016-12-23 MED ORDER — METOPROLOL TARTRATE 12.5 MG HALF TABLET
12.5000 mg | ORAL_TABLET | Freq: Two times a day (BID) | ORAL | Status: DC
  Administered 2016-12-24 – 2016-12-27 (×7): 12.5 mg via ORAL
  Filled 2016-12-23 (×8): qty 1

## 2016-12-23 MED ORDER — LIDOCAINE 2% (20 MG/ML) 5 ML SYRINGE
INTRAMUSCULAR | Status: AC
  Filled 2016-12-23: qty 10

## 2016-12-23 MED ORDER — LACTATED RINGERS IV SOLN
INTRAVENOUS | Status: DC | PRN
  Administered 2016-12-23 (×2): via INTRAVENOUS

## 2016-12-23 MED ORDER — DOPAMINE-DEXTROSE 3.2-5 MG/ML-% IV SOLN
0.0000 ug/kg/min | INTRAVENOUS | Status: DC
  Filled 2016-12-23: qty 250

## 2016-12-23 MED ORDER — DEXTROSE 5 % IV SOLN
750.0000 mg | INTRAVENOUS | Status: AC
  Administered 2016-12-23: 750 mg via INTRAVENOUS
  Filled 2016-12-23: qty 750

## 2016-12-23 MED ORDER — PHENYLEPHRINE HCL 10 MG/ML IJ SOLN
0.0000 ug/min | INTRAMUSCULAR | Status: DC
  Filled 2016-12-23: qty 2

## 2016-12-23 MED ORDER — SODIUM BICARBONATE 8.4 % IV SOLN
100.0000 meq | Freq: Once | INTRAVENOUS | Status: AC
  Administered 2016-12-23: 100 meq via INTRAVENOUS

## 2016-12-23 MED ORDER — SODIUM CHLORIDE 0.9% FLUSH: 10.0000 mL | INTRAVENOUS | Status: DC | PRN

## 2016-12-23 MED ORDER — SODIUM CHLORIDE 0.9% FLUSH
10.0000 mL | Freq: Two times a day (BID) | INTRAVENOUS | Status: DC
  Administered 2016-12-24 – 2016-12-26 (×2): 10 mL

## 2016-12-23 MED ORDER — ARTIFICIAL TEARS OPHTHALMIC OINT
TOPICAL_OINTMENT | OPHTHALMIC | Status: DC | PRN
  Administered 2016-12-23: 1 via OPHTHALMIC

## 2016-12-23 MED ORDER — ONDANSETRON HCL 4 MG/2ML IJ SOLN
INTRAMUSCULAR | Status: AC
  Filled 2016-12-23: qty 2

## 2016-12-23 MED ORDER — ACETAMINOPHEN 160 MG/5ML PO SOLN: 650.0000 mg | Freq: Once | ORAL | Status: AC

## 2016-12-23 MED ORDER — TRANEXAMIC ACID (OHS) BOLUS VIA INFUSION
15.0000 mg/kg | INTRAVENOUS | Status: AC
  Administered 2016-12-23: 1167 mg via INTRAVENOUS
  Filled 2016-12-23: qty 1167

## 2016-12-23 MED ORDER — ORAL CARE MOUTH RINSE
15.0000 mL | Freq: Four times a day (QID) | OROMUCOSAL | Status: DC
  Administered 2016-12-24 – 2016-12-27 (×9): 15 mL via OROMUCOSAL

## 2016-12-23 MED ORDER — POTASSIUM CHLORIDE 2 MEQ/ML IV SOLN
80.0000 meq | INTRAVENOUS | Status: DC
  Filled 2016-12-23: qty 40

## 2016-12-23 MED ORDER — MORPHINE SULFATE (PF) 4 MG/ML IV SOLN: 1.0000 mg | INTRAVENOUS | Status: DC | PRN

## 2016-12-23 MED ORDER — ACETAMINOPHEN 160 MG/5ML PO SOLN
1000.0000 mg | Freq: Four times a day (QID) | ORAL | Status: DC
  Administered 2016-12-24: 1000 mg
  Filled 2016-12-23: qty 40.6

## 2016-12-23 MED ORDER — SODIUM CHLORIDE 0.45 % IV SOLN: INTRAVENOUS | Status: DC | PRN

## 2016-12-23 MED ORDER — MIDAZOLAM HCL 5 MG/5ML IJ SOLN
INTRAMUSCULAR | Status: DC | PRN
  Administered 2016-12-23: 1 mg via INTRAVENOUS
  Administered 2016-12-23: 3 mg via INTRAVENOUS
  Administered 2016-12-23: 1 mg via INTRAVENOUS

## 2016-12-23 MED ORDER — VANCOMYCIN HCL 10 G IV SOLR
1250.0000 mg | INTRAVENOUS | Status: AC
  Administered 2016-12-23: 1250 mg via INTRAVENOUS
  Filled 2016-12-23: qty 1250

## 2016-12-23 MED ORDER — POTASSIUM CHLORIDE 10 MEQ/100ML IV SOLN
10.0000 meq | INTRAVENOUS | Status: AC
  Administered 2016-12-23 (×3): 10 meq via INTRAVENOUS

## 2016-12-23 MED ORDER — FENTANYL CITRATE (PF) 250 MCG/5ML IJ SOLN
INTRAMUSCULAR | Status: AC
  Filled 2016-12-23: qty 5

## 2016-12-23 MED ORDER — DOCUSATE SODIUM 100 MG PO CAPS
200.0000 mg | ORAL_CAPSULE | Freq: Every day | ORAL | Status: DC
  Administered 2016-12-24 – 2016-12-25 (×2): 200 mg via ORAL
  Filled 2016-12-23 (×3): qty 2

## 2016-12-23 MED ORDER — EPINEPHRINE PF 1 MG/ML IJ SOLN
0.0000 ug/min | INTRAVENOUS | Status: DC
  Filled 2016-12-23: qty 4

## 2016-12-23 MED ORDER — METOPROLOL TARTRATE 25 MG/10 ML ORAL SUSPENSION
12.5000 mg | Freq: Two times a day (BID) | ORAL | Status: DC
  Administered 2016-12-24: 12.5 mg
  Filled 2016-12-23: qty 5

## 2016-12-23 MED ORDER — ALBUMIN HUMAN 5 % IV SOLN
INTRAVENOUS | Status: DC | PRN
  Administered 2016-12-23 (×2): via INTRAVENOUS

## 2016-12-23 MED ORDER — OXYCODONE HCL 5 MG PO TABS: 5.0000 mg | ORAL_TABLET | ORAL | Status: DC | PRN

## 2016-12-23 MED ORDER — ASPIRIN 81 MG PO CHEW: 324.0000 mg | CHEWABLE_TABLET | Freq: Every day | ORAL | Status: DC

## 2016-12-23 MED ORDER — LACTATED RINGERS IV SOLN
INTRAVENOUS | Status: DC | PRN
  Administered 2016-12-23: 16:00:00 via INTRAVENOUS

## 2016-12-23 MED ORDER — CHLORHEXIDINE GLUCONATE 0.12 % MT SOLN
15.0000 mL | Freq: Once | OROMUCOSAL | Status: AC
  Administered 2016-12-23: 15 mL via OROMUCOSAL

## 2016-12-23 MED ORDER — LIDOCAINE 2% (20 MG/ML) 5 ML SYRINGE
INTRAMUSCULAR | Status: DC | PRN
  Administered 2016-12-23: 80 mg via INTRAVENOUS

## 2016-12-23 MED ORDER — 0.9 % SODIUM CHLORIDE (POUR BTL) OPTIME
TOPICAL | Status: DC | PRN
  Administered 2016-12-23: 5000 mL

## 2016-12-23 MED ORDER — EPHEDRINE SULFATE 50 MG/ML IJ SOLN: INTRAMUSCULAR | Status: DC | PRN

## 2016-12-23 MED ORDER — CHLORHEXIDINE GLUCONATE 0.12 % MT SOLN
15.0000 mL | OROMUCOSAL | Status: AC
  Administered 2016-12-23: 15 mL via OROMUCOSAL

## 2016-12-23 MED ORDER — SODIUM CHLORIDE 0.9 % IV SOLN: 250.0000 mL | INTRAVENOUS | Status: DC

## 2016-12-23 MED ORDER — MAGNESIUM SULFATE 4 GM/100ML IV SOLN
4.0000 g | Freq: Once | INTRAVENOUS | Status: AC
  Administered 2016-12-23: 4 g via INTRAVENOUS
  Filled 2016-12-23: qty 100

## 2016-12-23 MED ORDER — FAMOTIDINE IN NACL 20-0.9 MG/50ML-% IV SOLN
20.0000 mg | Freq: Two times a day (BID) | INTRAVENOUS | Status: AC
  Administered 2016-12-23 – 2016-12-24 (×2): 20 mg via INTRAVENOUS
  Filled 2016-12-23: qty 50

## 2016-12-23 MED ORDER — PROTAMINE SULFATE 10 MG/ML IV SOLN
INTRAVENOUS | Status: DC | PRN
Start: 2016-12-23 — End: 2016-12-23
  Administered 2016-12-23: 50 mg via INTRAVENOUS
  Administered 2016-12-23: 200 mg via INTRAVENOUS

## 2016-12-23 MED ORDER — SODIUM CHLORIDE 0.9 % IV SOLN
INTRAVENOUS | Status: AC
  Administered 2016-12-23: .5 [IU]/h via INTRAVENOUS
  Filled 2016-12-23: qty 1

## 2016-12-23 MED ORDER — DEXMEDETOMIDINE HCL IN NACL 400 MCG/100ML IV SOLN
0.0000 ug/kg/h | INTRAVENOUS | Status: DC
  Administered 2016-12-23: 0.5 ug/kg/h via INTRAVENOUS
  Filled 2016-12-23: qty 100

## 2016-12-23 MED ORDER — FENTANYL CITRATE (PF) 250 MCG/5ML IJ SOLN
INTRAMUSCULAR | Status: AC
  Filled 2016-12-23: qty 25

## 2016-12-23 MED ORDER — DEXTROSE 5 % IV SOLN: INTRAVENOUS | Status: DC | PRN

## 2016-12-23 MED ORDER — PROPOFOL 10 MG/ML IV BOLUS
INTRAVENOUS | Status: AC
  Filled 2016-12-23: qty 20

## 2016-12-23 MED ORDER — ALBUMIN HUMAN 5 % IV SOLN
250.0000 mL | INTRAVENOUS | Status: AC | PRN
  Administered 2016-12-23 – 2016-12-24 (×3): 250 mL via INTRAVENOUS
  Filled 2016-12-23: qty 250

## 2016-12-23 MED ORDER — BISACODYL 5 MG PO TBEC
10.0000 mg | DELAYED_RELEASE_TABLET | Freq: Every day | ORAL | Status: DC
  Administered 2016-12-24 – 2016-12-25 (×2): 10 mg via ORAL
  Filled 2016-12-23 (×3): qty 2

## 2016-12-23 MED ORDER — SODIUM CHLORIDE 0.9 % IJ SOLN
OROMUCOSAL | Status: DC | PRN
  Administered 2016-12-23 (×3): 4 mL via TOPICAL

## 2016-12-23 MED ORDER — ROCURONIUM BROMIDE 10 MG/ML (PF) SYRINGE
PREFILLED_SYRINGE | INTRAVENOUS | Status: DC | PRN
  Administered 2016-12-23 (×2): 50 mg via INTRAVENOUS

## 2016-12-23 MED ORDER — HEPARIN SODIUM (PORCINE) 1000 UNIT/ML IJ SOLN
INTRAMUSCULAR | Status: DC
  Filled 2016-12-23: qty 30

## 2016-12-23 MED ORDER — METOPROLOL TARTRATE 5 MG/5ML IV SOLN
2.5000 mg | INTRAVENOUS | Status: DC | PRN
  Administered 2016-12-24 – 2016-12-25 (×2): 5 mg via INTRAVENOUS
  Administered 2016-12-26 (×2): 2.5 mg via INTRAVENOUS
  Administered 2016-12-26: 5 mg via INTRAVENOUS
  Administered 2016-12-27: 2.5 mg via INTRAVENOUS
  Administered 2016-12-27 – 2017-01-02 (×9): 5 mg via INTRAVENOUS
  Filled 2016-12-23 (×15): qty 5

## 2016-12-23 MED ORDER — INSULIN REGULAR HUMAN 100 UNIT/ML IJ SOLN
INTRAMUSCULAR | Status: DC
  Filled 2016-12-23: qty 1

## 2016-12-23 MED ORDER — FENTANYL CITRATE (PF) 250 MCG/5ML IJ SOLN
INTRAMUSCULAR | Status: DC | PRN
  Administered 2016-12-23: 100 ug via INTRAVENOUS
  Administered 2016-12-23: 50 ug via INTRAVENOUS
  Administered 2016-12-23: 150 ug via INTRAVENOUS
  Administered 2016-12-23: 50 ug via INTRAVENOUS
  Administered 2016-12-23: 400 ug via INTRAVENOUS
  Administered 2016-12-23: 500 ug via INTRAVENOUS
  Administered 2016-12-23: 100 ug via INTRAVENOUS
  Administered 2016-12-23: 150 ug via INTRAVENOUS

## 2016-12-23 MED ORDER — MIDAZOLAM HCL 2 MG/2ML IJ SOLN: 2.0000 mg | INTRAMUSCULAR | Status: DC | PRN

## 2016-12-23 MED ORDER — ACETAMINOPHEN 500 MG PO TABS
1000.0000 mg | ORAL_TABLET | Freq: Four times a day (QID) | ORAL | Status: AC
  Administered 2016-12-24 – 2016-12-28 (×15): 1000 mg via ORAL
  Filled 2016-12-23 (×16): qty 2

## 2016-12-23 MED ORDER — LACTATED RINGERS IV SOLN: INTRAVENOUS | Status: DC

## 2016-12-23 MED ORDER — TRANEXAMIC ACID (OHS) PUMP PRIME SOLUTION
2.0000 mg/kg | INTRAVENOUS | Status: DC
  Filled 2016-12-23: qty 1.56

## 2016-12-23 MED ORDER — CEFUROXIME SODIUM 1.5 G IV SOLR
1.5000 g | INTRAVENOUS | Status: AC
  Administered 2016-12-23: 1.5 g via INTRAVENOUS
  Filled 2016-12-23: qty 1.5

## 2016-12-23 MED ORDER — ROCURONIUM BROMIDE 10 MG/ML (PF) SYRINGE
PREFILLED_SYRINGE | INTRAVENOUS | Status: AC
  Filled 2016-12-23: qty 10

## 2016-12-23 MED ORDER — NITROGLYCERIN IN D5W 200-5 MCG/ML-% IV SOLN
2.0000 ug/min | INTRAVENOUS | Status: DC
  Filled 2016-12-23: qty 250

## 2016-12-23 MED ORDER — SODIUM CHLORIDE 0.9% FLUSH
3.0000 mL | Freq: Two times a day (BID) | INTRAVENOUS | Status: DC
  Administered 2016-12-25 – 2016-12-31 (×8): 3 mL via INTRAVENOUS

## 2016-12-23 MED ORDER — CHLORHEXIDINE GLUCONATE CLOTH 2 % EX PADS
6.0000 | MEDICATED_PAD | Freq: Every day | CUTANEOUS | Status: DC
  Administered 2016-12-24 – 2016-12-26 (×3): 6 via TOPICAL

## 2016-12-23 MED ORDER — MAGNESIUM SULFATE 50 % IJ SOLN
40.0000 meq | INTRAMUSCULAR | Status: DC
  Filled 2016-12-23 (×2): qty 9.85

## 2016-12-23 MED ORDER — DEXMEDETOMIDINE HCL IN NACL 400 MCG/100ML IV SOLN
0.1000 ug/kg/h | INTRAVENOUS | Status: AC
  Administered 2016-12-23: 0.7 ug/kg/h via INTRAVENOUS
  Filled 2016-12-23: qty 100

## 2016-12-23 MED ORDER — POTASSIUM CHLORIDE 10 MEQ/100ML IV SOLN
INTRAVENOUS | Status: AC
  Filled 2016-12-23: qty 300

## 2016-12-23 MED ORDER — PLASMA-LYTE 148 IV SOLN
INTRAVENOUS | Status: AC
  Administered 2016-12-23: 500 mL
  Filled 2016-12-23: qty 2.5

## 2016-12-23 MED ORDER — METOPROLOL TARTRATE 12.5 MG HALF TABLET
12.5000 mg | ORAL_TABLET | Freq: Once | ORAL | Status: AC
  Administered 2016-12-23: 12.5 mg via ORAL
  Filled 2016-12-23: qty 1

## 2016-12-23 MED ORDER — MORPHINE SULFATE (PF) 4 MG/ML IV SOLN
1.0000 mg | INTRAVENOUS | Status: DC | PRN
  Administered 2016-12-23: 2 mg via INTRAVENOUS
  Administered 2016-12-24: 1 mg via INTRAVENOUS
  Administered 2016-12-24: 2 mg via INTRAVENOUS
  Filled 2016-12-23 (×3): qty 1

## 2016-12-23 MED ORDER — HEPARIN SODIUM (PORCINE) 1000 UNIT/ML IJ SOLN
INTRAMUSCULAR | Status: DC | PRN
  Administered 2016-12-23: 25000 [IU] via INTRAVENOUS

## 2016-12-23 MED ORDER — INSULIN REGULAR BOLUS VIA INFUSION
0.0000 [IU] | Freq: Three times a day (TID) | INTRAVENOUS | Status: DC
  Filled 2016-12-23: qty 10

## 2016-12-23 MED ORDER — SODIUM CHLORIDE 0.9% FLUSH: 3.0000 mL | INTRAVENOUS | Status: DC | PRN

## 2016-12-23 MED ORDER — LACTATED RINGERS IV SOLN
INTRAVENOUS | Status: DC | PRN
  Administered 2016-12-23: 15:00:00 via INTRAVENOUS

## 2016-12-23 MED ORDER — PHENYLEPHRINE 40 MCG/ML (10ML) SYRINGE FOR IV PUSH (FOR BLOOD PRESSURE SUPPORT)
PREFILLED_SYRINGE | INTRAVENOUS | Status: AC
  Filled 2016-12-23: qty 10

## 2016-12-23 MED ORDER — TRANEXAMIC ACID 1000 MG/10ML IV SOLN
1.5000 mg/kg/h | INTRAVENOUS | Status: AC
  Administered 2016-12-23: 1.5 mg/kg/h via INTRAVENOUS
  Filled 2016-12-23: qty 25

## 2016-12-23 MED ORDER — POTASSIUM CHLORIDE 10 MEQ/50ML IV SOLN
10.0000 meq | INTRAVENOUS | Status: AC
  Administered 2016-12-23 (×3): 10 meq via INTRAVENOUS

## 2016-12-23 MED ORDER — VANCOMYCIN HCL 1000 MG IV SOLR
INTRAVENOUS | Status: AC
  Administered 2016-12-23: 1000 mL
  Filled 2016-12-23: qty 1000

## 2016-12-23 MED ORDER — PROPOFOL 10 MG/ML IV BOLUS
INTRAVENOUS | Status: DC | PRN
  Administered 2016-12-23: 30 mg via INTRAVENOUS

## 2016-12-23 MED ORDER — ONDANSETRON HCL 4 MG/2ML IJ SOLN
4.0000 mg | Freq: Four times a day (QID) | INTRAMUSCULAR | Status: DC | PRN
  Administered 2016-12-24 – 2016-12-28 (×5): 4 mg via INTRAVENOUS
  Filled 2016-12-23 (×5): qty 2

## 2016-12-23 MED ORDER — DEXAMETHASONE SODIUM PHOSPHATE 10 MG/ML IJ SOLN
INTRAMUSCULAR | Status: AC
  Filled 2016-12-23: qty 1

## 2016-12-23 MED ORDER — SODIUM CHLORIDE 0.9 % IV SOLN
30.0000 ug/min | INTRAVENOUS | Status: AC
  Administered 2016-12-23: 25 ug/min via INTRAVENOUS
  Filled 2016-12-23: qty 2

## 2016-12-23 MED ORDER — NITROGLYCERIN IN D5W 200-5 MCG/ML-% IV SOLN
0.0000 ug/min | INTRAVENOUS | Status: DC
  Filled 2016-12-23: qty 250

## 2016-12-23 MED ORDER — VANCOMYCIN HCL IN DEXTROSE 1-5 GM/200ML-% IV SOLN
1000.0000 mg | Freq: Once | INTRAVENOUS | Status: AC
  Administered 2016-12-24: 1000 mg via INTRAVENOUS
  Filled 2016-12-23: qty 200

## 2016-12-23 SURGICAL SUPPLY — 103 items
APPLIER CLIP 9.375 SM OPEN (CLIP) ×3
BAG DECANTER FOR FLEXI CONT (MISCELLANEOUS) ×6 IMPLANT
BANDAGE ACE 4X5 VEL STRL LF (GAUZE/BANDAGES/DRESSINGS) ×3 IMPLANT
BANDAGE ACE 6X5 VEL STRL LF (GAUZE/BANDAGES/DRESSINGS) ×3 IMPLANT
BASKET HEART (ORDER IN 25'S) (MISCELLANEOUS) ×1
BASKET HEART (ORDER IN 25S) (MISCELLANEOUS) ×2 IMPLANT
BLADE CLIPPER SURG (BLADE) IMPLANT
BLADE STERNUM SYSTEM 6 (BLADE) ×3 IMPLANT
BNDG GAUZE ELAST 4 BULKY (GAUZE/BANDAGES/DRESSINGS) ×3 IMPLANT
CANISTER SUCT 3000ML PPV (MISCELLANEOUS) ×3 IMPLANT
CANNULA EZ GLIDE AORTIC 21FR (CANNULA) ×6 IMPLANT
CATH CPB KIT OWEN (MISCELLANEOUS) ×3 IMPLANT
CATH THORACIC 36FR (CATHETERS) ×3 IMPLANT
CLIP APPLIE 9.375 SM OPEN (CLIP) ×2 IMPLANT
CLIP RETRACTION 3.0MM CORONARY (MISCELLANEOUS) ×3 IMPLANT
CLIP VESOCCLUDE MED 24/CT (CLIP) IMPLANT
CLIP VESOCCLUDE SM WIDE 24/CT (CLIP) IMPLANT
CRADLE DONUT ADULT HEAD (MISCELLANEOUS) ×3 IMPLANT
DRAIN CHANNEL 32F RND 10.7 FF (WOUND CARE) ×6 IMPLANT
DRAPE CARDIOVASCULAR INCISE (DRAPES) ×1
DRAPE INCISE IOBAN 66X45 STRL (DRAPES) ×3 IMPLANT
DRAPE OEC MINIVIEW 54X84 (DRAPES) ×3 IMPLANT
DRAPE SLUSH/WARMER DISC (DRAPES) ×3 IMPLANT
DRAPE SRG 135X102X78XABS (DRAPES) ×2 IMPLANT
DRSG AQUACEL AG ADV 3.5X14 (GAUZE/BANDAGES/DRESSINGS) ×3 IMPLANT
DRSG COVADERM 4X14 (GAUZE/BANDAGES/DRESSINGS) ×3 IMPLANT
ELECT BLADE 4.0 EZ CLEAN MEGAD (MISCELLANEOUS) ×3
ELECT REM PT RETURN 9FT ADLT (ELECTROSURGICAL) ×6
ELECTRODE BLDE 4.0 EZ CLN MEGD (MISCELLANEOUS) ×2 IMPLANT
ELECTRODE REM PT RTRN 9FT ADLT (ELECTROSURGICAL) ×4 IMPLANT
FELT TEFLON 1X6 (MISCELLANEOUS) ×6 IMPLANT
GAUZE SPONGE 4X4 12PLY STRL (GAUZE/BANDAGES/DRESSINGS) ×6 IMPLANT
GAUZE SPONGE 4X4 12PLY STRL LF (GAUZE/BANDAGES/DRESSINGS) ×9 IMPLANT
GAUZE XEROFORM 1X8 LF (GAUZE/BANDAGES/DRESSINGS) ×3 IMPLANT
GLOVE BIOGEL PI IND STRL 6 (GLOVE) ×2 IMPLANT
GLOVE BIOGEL PI IND STRL 6.5 (GLOVE) ×2 IMPLANT
GLOVE BIOGEL PI INDICATOR 6 (GLOVE) ×1
GLOVE BIOGEL PI INDICATOR 6.5 (GLOVE) ×1
GLOVE ORTHO TXT STRL SZ7.5 (GLOVE) ×6 IMPLANT
GOWN STRL REUS W/ TWL LRG LVL3 (GOWN DISPOSABLE) ×8 IMPLANT
GOWN STRL REUS W/TWL LRG LVL3 (GOWN DISPOSABLE) ×4
HEMOSTAT POWDER SURGIFOAM 1G (HEMOSTASIS) ×9 IMPLANT
INSERT FOGARTY XLG (MISCELLANEOUS) ×3 IMPLANT
KIT BASIN OR (CUSTOM PROCEDURE TRAY) ×3 IMPLANT
KIT ROOM TURNOVER OR (KITS) ×3 IMPLANT
KIT SUCTION CATH 14FR (SUCTIONS) ×9 IMPLANT
KIT VASOVIEW HEMOPRO VH 3000 (KITS) ×3 IMPLANT
LEAD PACING MYOCARDI (MISCELLANEOUS) ×3 IMPLANT
MARKER GRAFT CORONARY BYPASS (MISCELLANEOUS) ×9 IMPLANT
NS IRRIG 1000ML POUR BTL (IV SOLUTION) ×15 IMPLANT
PACK E OPEN HEART (SUTURE) ×3 IMPLANT
PACK OPEN HEART (CUSTOM PROCEDURE TRAY) ×3 IMPLANT
PAD ARMBOARD 7.5X6 YLW CONV (MISCELLANEOUS) ×6 IMPLANT
PAD ELECT DEFIB RADIOL ZOLL (MISCELLANEOUS) ×3 IMPLANT
PENCIL BUTTON HOLSTER BLD 10FT (ELECTRODE) ×3 IMPLANT
PUNCH AORTIC ROT 4.0MM RCL 40 (MISCELLANEOUS) ×3 IMPLANT
PUNCH AORTIC ROTATE 4.0MM (MISCELLANEOUS) IMPLANT
PUNCH AORTIC ROTATE 4.5MM 8IN (MISCELLANEOUS) IMPLANT
PUNCH AORTIC ROTATE 5MM 8IN (MISCELLANEOUS) IMPLANT
SET CARDIOPLEGIA MPS 5001102 (MISCELLANEOUS) ×3 IMPLANT
SOLUTION ANTI FOG 6CC (MISCELLANEOUS) IMPLANT
SPONGE LAP 18X18 X RAY DECT (DISPOSABLE) IMPLANT
SPONGE LAP 4X18 X RAY DECT (DISPOSABLE) IMPLANT
SUT BONE WAX W31G (SUTURE) ×3 IMPLANT
SUT ETHIBOND X763 2 0 SH 1 (SUTURE) ×15 IMPLANT
SUT MNCRL AB 3-0 PS2 18 (SUTURE) ×6 IMPLANT
SUT MNCRL AB 4-0 PS2 18 (SUTURE) ×6 IMPLANT
SUT PDS AB 1 CTX 36 (SUTURE) ×6 IMPLANT
SUT PROLENE 2 0 SH DA (SUTURE) IMPLANT
SUT PROLENE 3 0 SH DA (SUTURE) ×12 IMPLANT
SUT PROLENE 3 0 SH1 36 (SUTURE) IMPLANT
SUT PROLENE 4 0 RB 1 (SUTURE) ×2
SUT PROLENE 4 0 SH DA (SUTURE) IMPLANT
SUT PROLENE 4-0 RB1 .5 CRCL 36 (SUTURE) ×4 IMPLANT
SUT PROLENE 5 0 C 1 36 (SUTURE) IMPLANT
SUT PROLENE 6 0 C 1 30 (SUTURE) IMPLANT
SUT PROLENE 7 0 BV 1 (SUTURE) ×12 IMPLANT
SUT PROLENE 7.0 RB 3 (SUTURE) ×27 IMPLANT
SUT PROLENE 8 0 BV175 6 (SUTURE) IMPLANT
SUT PROLENE BLUE 7 0 (SUTURE) ×3 IMPLANT
SUT PROLENE POLY MONO (SUTURE) IMPLANT
SUT SILK  1 MH (SUTURE) ×1
SUT SILK 1 MH (SUTURE) ×2 IMPLANT
SUT STEEL 6MS V (SUTURE) IMPLANT
SUT STEEL STERNAL CCS#1 18IN (SUTURE) IMPLANT
SUT STEEL SZ 6 DBL 3X14 BALL (SUTURE) IMPLANT
SUT VIC AB 1 CTX 36 (SUTURE)
SUT VIC AB 1 CTX36XBRD ANBCTR (SUTURE) IMPLANT
SUT VIC AB 2-0 CT1 27 (SUTURE) ×2
SUT VIC AB 2-0 CT1 TAPERPNT 27 (SUTURE) ×4 IMPLANT
SUT VIC AB 2-0 CTX 27 (SUTURE) IMPLANT
SUT VIC AB 3-0 SH 27 (SUTURE)
SUT VIC AB 3-0 SH 27X BRD (SUTURE) IMPLANT
SUT VIC AB 3-0 X1 27 (SUTURE) IMPLANT
SUT VICRYL 4-0 PS2 18IN ABS (SUTURE) IMPLANT
SYSTEM SAHARA CHEST DRAIN ATS (WOUND CARE) ×3 IMPLANT
TAPE CLOTH SURG 4X10 WHT LF (GAUZE/BANDAGES/DRESSINGS) ×9 IMPLANT
TAPE PAPER 2X10 WHT MICROPORE (GAUZE/BANDAGES/DRESSINGS) ×3 IMPLANT
TOWEL GREEN STERILE FF (TOWEL DISPOSABLE) ×3 IMPLANT
TRAY FOLEY SILVER 16FR TEMP (SET/KITS/TRAYS/PACK) ×3 IMPLANT
TUBING INSUFFLATION (TUBING) ×3 IMPLANT
UNDERPAD 30X30 (UNDERPADS AND DIAPERS) ×3 IMPLANT
WATER STERILE IRR 1000ML POUR (IV SOLUTION) ×6 IMPLANT

## 2016-12-23 NOTE — Anesthesia Procedure Notes (Signed)
Procedure Name: Intubation Date/Time: 12/23/2016 2:34 PM Performed by: Oletta Lamas, CRNA Pre-anesthesia Checklist: Patient identified, Emergency Drugs available, Suction available and Patient being monitored Patient Re-evaluated:Patient Re-evaluated prior to induction Oxygen Delivery Method: Circle System Utilized Preoxygenation: Pre-oxygenation with 100% oxygen Induction Type: IV induction Ventilation: Mask ventilation without difficulty Laryngoscope Size: Mac and 3 Grade View: Grade II Tube type: Oral Number of attempts: 1 Airway Equipment and Method: Stylet Placement Confirmation: ETT inserted through vocal cords under direct vision,  positive ETCO2 and breath sounds checked- equal and bilateral Secured at: 22 cm Tube secured with: Tape Dental Injury: Teeth and Oropharynx as per pre-operative assessment

## 2016-12-23 NOTE — Anesthesia Procedure Notes (Signed)
Arterial Line Insertion Start/End12/12/2016 2:10 PM, 12/23/2016 2:16 PM Performed by: Oletta Lamas, CRNA, CRNA  Preanesthetic checklist: patient identified, IV checked, site marked, risks and benefits discussed, surgical consent, monitors and equipment checked and pre-op evaluation Lidocaine 1% used for infiltration radial was placed Catheter size: 20 G Hand hygiene performed , maximum sterile barriers used  and Seldinger technique used Allen's test indicative of satisfactory collateral circulation Attempts: 1 Procedure performed without using ultrasound guided technique. Following insertion, Biopatch and dressing applied. Post procedure assessment: normal

## 2016-12-23 NOTE — Transfer of Care (Signed)
Immediate Anesthesia Transfer of Care Note  Patient: [name]  Procedure(s) Performed: CORONARY ARTERY BYPASS GRAFTING (CABG) TIMES THREE USING LEFT INTERNAL MAMMARY ARTERY AND LEFT SAPHENOUS LEG VEIN HARVESTED ENDOSCOPICALLY, (N/A Chest) TRANSESOPHAGEAL ECHOCARDIOGRAM (TEE) (N/A )  Patient Location: PACU and SICU  Anesthesia Type:General  Level of Consciousness: sedated and Patient remains intubated per anesthesia plan  Airway & Oxygen Therapy: Patient remains intubated per anesthesia plan and Patient placed on Ventilator (see vital sign flow sheet for setting)  Post-op Assessment: Report given to RN and Post -op Vital signs reviewed and stable  Post vital signs: Reviewed and stable  Last Vitals:  Vitals:   12/23/16 1200 12/23/16 1300  BP: 109/65 136/78  Pulse: 67   Resp: 13   Temp: 36.9 C   SpO2: 100%     Last Pain:  Vitals:   12/23/16 1200  TempSrc: Oral  PainSc:          Complications: No apparent anesthesia complications

## 2016-12-23 NOTE — Progress Notes (Signed)
      [address]       [place] [address]             [phone]        CARDIOTHORACIC SURGERY PROGRESS NOTE  Subjective: No chest pain overnight.  No SOB.  Family at bedside.  Objective: Vital signs: BP Readings from Last 1 Encounters:  12/23/16 (!) 145/61   Pulse Readings from Last 1 Encounters:  12/23/16 70   Resp Readings from Last 1 Encounters:  12/23/16 11   Temp Readings from Last 1 Encounters:  12/23/16 98.4 F (36.9 C) (Oral)    Hemodynamics:    Physical Exam:  Rhythm:   sinus  Breath sounds: clear  Heart sounds:  RRR  Incisions:  n/a  Abdomen:  soft  Extremities:  Warm, well perfused   Intake/Output from previous day: 12/11 0701 - 12/12 0700 In: 123 [I.V.:123] Out: 655 [Urine:655] Intake/Output this shift: No intake/output data recorded.  Lab Results:  CBC: Recent Labs    12/21/16 1812 12/23/16 0611  WBC 6.5 6.1  HGB 12.9 11.8*  HCT 38.6 36.0  PLT 178 149*    BMET:  Recent Labs    12/21/16 1812 12/23/16 0611  NA 137 138  K 4.0 3.2*  CL 106 105  CO2 24 23  GLUCOSE 147* 87  BUN 13 8  CREATININE 0.85 0.78  CALCIUM 9.0 8.5*     PT/INR:   Recent Labs    12/22/16 0552  LABPROT 12.7  INR 0.96    CBG (last 3)  Recent Labs    12/22/16 1401  GLUCAP 96    ABG No results found for: PHART, PCO2ART, PO2ART, HCO3, TCO2, ACIDBASEDEF, O2SAT  CXR: PORTABLE CHEST 1 VIEW  COMPARISON:  Portable chest x-ray of December 22, 2016  FINDINGS: Again demonstrated is the IABP marker that projects over inferior aspect of the aortic arch. The cardiac silhouette is top-normal in size. The interstitium within the mid and lower aspects of both lungs is more conspicuous today. There is no alveolar infiltrate, pleural effusion, or pneumothorax. There is dense calcification in the wall of the thoracic aorta.  IMPRESSION: Stable positioning of the intra-aortic balloon pump marker.  Mildly increased interstitial  markings in the mid and lower lungs bilaterally may reflect low-grade interstitial edema.  Thoracic aortic atherosclerosis.   Electronically Signed   By: David  Martinique M.D.   On: 12/23/2016 07:52  Assessment/Plan:  I have again reviewed options with the patient and her family this morning.  We discussed the possibility that high risk PCI may or may not be feasible.  If not then her alternatives are high risk surgery or palliative care.  She reaffirmed her decision that she does not want surgery.  All questions answered.  I spent in excess of 15 minutes during the conduct of this hospital encounter and >50% of this time involved direct face-to-face encounter with the patient for counseling and/or coordination of their care.   Rexene Alberts, MD 12/23/2016 7:55 AM

## 2016-12-23 NOTE — Anesthesia Preprocedure Evaluation (Addendum)
Anesthesia Evaluation  Patient identified by MRN, date of birth, ID band Patient awake    Reviewed: Allergy & Precautions, NPO status , Patient's Chart, lab work & pertinent test results  Airway Mallampati: III  TM Distance: >3 FB Neck ROM: Full    Dental no notable dental hx.    Pulmonary neg pulmonary ROS,    Pulmonary exam normal breath sounds clear to auscultation       Cardiovascular hypertension, + CAD, + Past MI and + Cardiac Stents  Normal cardiovascular exam Rhythm:Regular Rate:Normal  ECG: NSR, rate 71  ECHO:  Left ventricle: The cavity size was normal. Wall thickness was normal. Systolic function was normal. The estimated ejection fraction was in the range of 60% to 65%. Doppler parameters are consistent with abnormal left ventricular relaxation (grade 1   diastolic dysfunction). Aortic valve: Mildly thickened leaflets. Mitral valve: Calcified annulus. Mildly thickened leaflets . Right ventricle: Poorly visualized. The cavity size was normal. Systolic function was normal. Right atrium: The atrium was mildly dilated.  Conclusions: Severe, three vessel coronary artery disease, including 90% distal LMCA stenosis involving the ostia of the LAD and LCx, as well as chronic total occlusion of the RCA. Distal RCA fills via left-to-right collaterals. Mildly elevated left ventricular filling pressure. Abdominal aortic atherosclerosis and calcification without significant stenosis and aneurysm. Successful placement of 40 mL intraaortic balloon pump via the right common femoral artery.    Neuro/Psych PSYCHIATRIC DISORDERS Anxiety Depression    GI/Hepatic negative GI ROS, Neg liver ROS,   Endo/Other  negative endocrine ROS  Renal/GU negative Renal ROS     Musculoskeletal Left hand hematoma   Abdominal   Peds  Hematology Hyperlipidemia   Anesthesia Other Findings IABP @ 1:1 Heparin gtt Nitro gtt   Reproductive/Obstetrics                            Anesthesia Physical Anesthesia Plan  ASA: IV and emergent  Anesthesia Plan: General   Post-op Pain Management:    Induction: Intravenous  PONV Risk Score and Plan:   Airway Management Planned: Oral ETT  Additional Equipment: Arterial line, CVP, PA Cath, TEE and Ultrasound Guidance Line Placement  Intra-op Plan:   Post-operative Plan: Post-operative intubation/ventilation  Informed Consent: I have reviewed the patients History and Physical, chart, labs and discussed the procedure including the risks, benefits and alternatives for the proposed anesthesia with the patient or authorized representative who has indicated his/her understanding and acceptance.   Dental advisory given  Plan Discussed with: CRNA  Anesthesia Plan Comments:         Anesthesia Quick Evaluation

## 2016-12-23 NOTE — Plan of Care (Signed)
Plan of care 

## 2016-12-23 NOTE — Anesthesia Procedure Notes (Signed)
Central Venous Catheter Insertion Performed by: Lillia Abed, MD, anesthesiologist Start/End12/12/2016 2:00 PM, 12/23/2016 2:15 PM Patient location: Pre-op. Preanesthetic checklist: patient identified, IV checked, risks and benefits discussed, surgical consent, monitors and equipment checked, pre-op evaluation, timeout performed and anesthesia consent Position: Trendelenburg Patient sedated Hand hygiene performed , maximum sterile barriers used  and Seldinger technique used Catheter size: 8.5 Fr PA cath was placed.MAC introducer Swan type:thermodilution Procedure performed using ultrasound guided technique. Ultrasound Notes:anatomy identified, needle tip was noted to be adjacent to the nerve/plexus identified, no ultrasound evidence of intravascular and/or intraneural injection and image(s) printed for medical record Attempts: 1 Following insertion, line sutured and dressing applied. Post procedure assessment: blood return through all ports, free fluid flow and no air  Patient tolerated the procedure well with no immediate complications.

## 2016-12-23 NOTE — Progress Notes (Signed)
  Echocardiogram Echocardiogram Transesophageal has been performed.  [name] 12/23/2016, 3:16 PM

## 2016-12-23 NOTE — Op Note (Signed)
CARDIOTHORACIC SURGERY OPERATIVE NOTE  Date of Procedure: 12/23/2016  Preoperative Diagnosis:   Critical Left Main and 3-vessel Coronary Artery Disease  Unstable Angina Pectoris  S/P Acute Non-STEMI  S/P IABP Placement  Postoperative Diagnosis: Same  Procedure:    Coronary Artery Bypass Grafting x 3   Left Internal Mammary Artery to Distal Left Anterior Descending Coronary Artery  Saphenous Vein Graft to Posterior Descending Coronary Artery  Saphenous Vein Graft to Obtuse Marginal Branch of Left Circumflex Coronary Artery  Endoscopic Vein Harvest from Right Thigh and Lower Leg  Surgeon: Valentina Gu. Roxy Manns, MD  Assistant: Nicholes Rough, PA-C  Anesthesia: Lillia Abed, MD  Operative Findings:  Normal left ventricular systolic function  Good quality left internal mammary artery conduit  Fair quality saphenous vein conduit  Fair quality target vessels for grafting     BRIEF CLINICAL NOTE AND INDICATIONS FOR SURGERY  Patient is an 83 year old female with known history of coronary artery disease status post multiple previous coronary interventions in the past, hypertension, hyperlipidemia, previous TIA, chronic diarrhea, depression, anxiety, and previous right lower lobectomy for atypical carcinoid tumor of the lung who presented with acute coronary syndrome and weakly positive cardiac enzymes, underwent catheterization earlier today at [facility] by Dr. Saunders Revel demonstrating critical left main and three-vessel coronary artery disease, and was transferred to [facility] for surgical consultation.  The patient's cardiac history dates back to 1986 when she underwent PTCA of the left anterior descending coronary artery.  2001 she had PCI and stenting of the left anterior descending coronary artery and the left circumflex.  For the past 6 months the patient states she has been having symptoms of substernal chest pain associated with shortness of breath.  Symptoms initially were  associated with activity or meals and frequently relieved by sublingual nitroglycerin.  She was hospitalized in June 2018 and ruled out for myocardial infarction.  Noninvasive stress test was recommended but the patient declined.  Symptoms have progressed recently and on the afternoon of December 21, 2016 she was awoken with severe pain between her shoulder blades chest and upper abdomen, all associated with shortness of breath.  Symptoms persisted for more than 20 minutes.  EMS was called and she was taken to [facility] where ECG revealed sinus rhythm without acute changes.  Initial troponin was normal but subsequent enzymes increased to 0.29.  The patient underwent diagnostic cardiac catheterization earlier today demonstrating critical left main and three-vessel coronary artery disease.  The patient had some chest pain prior to and during catheterization, and intra-aortic balloon pump was placed.  The patient has been pain-free ever since.  She was transferred to [facility] for further management.  The patient has been seen in consultation and counseled at length regarding the indications, risks and potential benefits of surgery.  All questions have been answered, and the patient provides full informed consent for the operation as described.      DETAILS OF THE OPERATIVE PROCEDURE  Preparation:  The patient is brought to the operating room on the above mentioned date and central monitoring was established by the anesthesia team including placement of Swan-Ganz catheter and radial arterial line. The patient is placed in the supine position on the operating table.  Intravenous antibiotics are administered. General endotracheal anesthesia is induced uneventfully. A Foley catheter is placed.  Baseline transesophageal echocardiogram was performed.  Findings were notable for normal LV size and systolic function.  The patient's chest, abdomen, both groins, and both lower extremities are  prepared  and draped in a sterile manner. A time out procedure is performed.   Surgical Approach and Conduit Harvest:  A median sternotomy incision was performed.  There was a large dermoid cyst in the midline approximately 4-6 cm above the xyphoid process that had to be excised sharply.  There were no signs of infection.  The left internal mammary artery is dissected from the chest wall and prepared for bypass grafting. The left internal mammary artery is notably good quality conduit. Simultaneously, the greater saphenous vein is obtained from the patient's left thigh using endoscopic vein harvest technique. The saphenous vein is notably fair quality conduit. After removal of the saphenous vein, the small surgical incisions in the lower extremity are closed with absorbable suture. Following systemic heparinization, the left internal mammary artery was transected distally noted to have excellent flow.   Extracorporeal Cardiopulmonary Bypass and Myocardial Protection:  The pericardium is opened. The ascending aorta is mildly dilated in appearance. The ascending aorta and the right atrium are cannulated for cardiopulmonary bypass.  Adequate heparinization is verified.     The entire pre-bypass portion of the operation was notable for stable hemodynamics.  Cardiopulmonary bypass was begun and the surface of the heart is inspected. Distal target vessels are selected for coronary artery bypass grafting. A cardioplegia cannula is placed in the ascending aorta.  A temperature probe was placed in the interventricular septum.  The patient is allowed to cool passively to [facility] systemic temperature.  The aortic cross clamp is applied and cold blood cardioplegia is delivered initially in an antegrade fashion through the aortic root.  Iced saline slush is applied for topical hypothermia.  The initial cardioplegic arrest is rapid with early diastolic arrest.  Repeat doses of cardioplegia are administered intermittently  throughout the entire cross clamp portion of the operation through the aortic root and through subsequently placed vein grafts in order to maintain completely flat electrocardiogram and septal myocardial temperature below 15C.  Myocardial protection was felt to be excellent.  Coronary Artery Bypass Grafting:   The posterior descending branch of the right coronary artery was grafted using a reversed saphenous vein graft in an end-to-side fashion.  At the site of distal anastomosis the target vessel was fair quality and measured approximately 1.5 mm in diameter.  The obtuse marginal branch of the left circumflex coronary artery was grafted using a reversed saphenous vein graft in an end-to-side fashion.  At the site of distal anastomosis the target vessel was good quality and measured approximately 2.0 mm in diameter.  The distal left anterior coronary artery was grafted with the left internal mammary artery in an end-to-side fashion.  At the site of distal anastomosis the target vessel was fair quality and measured approximately 2.0 mm in diameter.  All proximal vein graft anastomoses were placed directly to the ascending aorta prior to removal of the aortic cross clamp.  The septal myocardial temperature rose rapidly after reperfusion of the left internal mammary artery graft.  The aortic cross clamp was removed after a total cross clamp time of 58 minutes.   Procedure Completion:  All proximal and distal coronary anastomoses were inspected for hemostasis and appropriate graft orientation. Epicardial pacing wires are fixed to the right ventricular outflow tract and to the right atrial appendage. The patient is rewarmed to 37C temperature. The patient is weaned and disconnected from cardiopulmonary bypass.  The patient's rhythm at separation from bypass was sinus.  The patient was weaned from cardiopulmonary bypass without any inotropic  support. Total cardiopulmonary bypass time for the operation  was 81 minutes.  Followup transesophageal echocardiogram performed after separation from bypass revealed no changes from the preoperative exam.  The aortic and venous cannula were removed. Several extra pledgeted Prolene sutures were required to control the aortic cannulation site.  Protamine was administered to reverse the anticoagulation. The mediastinum and pleural space were inspected for hemostasis and irrigated with saline solution. The mediastinum and the left pleural space were drained using 3 chest tubes placed through separate stab incisions inferiorly.  The soft tissues anterior to the aorta were reapproximated loosely. The sternum is closed with double strength sternal wire. The soft tissues anterior to the sternum were closed in multiple layers and the skin is closed with a running subcuticular skin closure.  The post-bypass portion of the operation was notable for stable rhythm and hemodynamics.  No blood products were administered during the operation.   Disposition:  The patient tolerated the procedure well and is transported to the surgical intensive care in stable condition. There are no intraoperative complications. All sponge instrument and needle counts are verified correct at completion of the operation.    Valentina Gu. Roxy Manns MD 12/23/2016 6:48 PM

## 2016-12-23 NOTE — Care Management Note (Addendum)
Case Management Note  Patient Details  Name: LOUCINDA CROY MRN: [id number] Date of Birth: 1930/12/10  Subjective/Objective:   From home  With son and grandson and daughter n law, sone and daughter n law works and grandson going off to [employer] in January, s/p cath  At [facility] yesterday, she was transferred to [facility], per cath report-Critical life threatening multivessel CAD:  95% calcified eccentric LM, plan for CABG surgery today per Cards note. Per Yolanda Bonine they plan for her to be able to go to [facility] in [place], [place] ordered, Await pt eval.      12/17 0959 Tomi Bamberger RN, BSN - POD 5  CABG, post op afib over the weekend, med adjustments made, BP elevated , cont to diuresis. Plan for CIR vs SNF, transfer to SDU.  -                 Action/Plan: NCM will follow for dc needs.   Expected Discharge Date:                  Expected Discharge Plan:     In-House Referral:     Discharge planning Services  CM Consult  Post Acute Care Choice:    Choice offered to:     DME Arranged:    DME Agency:     HH Arranged:    HH Agency:     Status of Service:  In process, will continue to follow  If discussed at Long Length of Stay Meetings, dates discussed:    Additional Comments:  Zenon Mayo, RN 12/23/2016, 10:23 AM

## 2016-12-23 NOTE — Progress Notes (Addendum)
Progress Note  Patient Name: [name] Date of Encounter: 12/23/2016  Primary Cardiologist: Dr. Saunders Revel  Subjective   No chest pain  Inpatient Medications    Scheduled Meds: . aspirin EC  81 mg Oral Daily  . atorvastatin  80 mg Oral Daily  . furosemide  20 mg Oral Daily  . losartan  100 mg Oral Daily  . mouth rinse  15 mL Mouth Rinse BID  . metoprolol succinate  75 mg Oral Daily  . multivitamin with minerals  1 tablet Oral Daily  . pantoprazole  40 mg Oral Daily   Continuous Infusions: . heparin 800 Units/hr (12/23/16 0800)  . nitroGLYCERIN 5 mcg/min (12/23/16 0800)   PRN Meds: acetaminophen, HYDROcodone-acetaminophen, LORazepam, morphine, nitroGLYCERIN, ondansetron (ZOFRAN) IV, zolpidem   Vital Signs    Vitals:   12/23/16 0600 12/23/16 0700 12/23/16 0758 12/23/16 0800  BP: (!) 144/80 (!) 145/61  (!) 148/93  Pulse: 70 73  77  Resp: 11 13  13   Temp:   98.1 F (36.7 C)   TempSrc:   Oral   SpO2: 97% 97%  97%  Weight:      Height:        Intake/Output Summary (Last 24 hours) at 12/23/2016 0838 Last data filed at 12/23/2016 0800 Gross per 24 hour  Intake 132.45 ml  Output 675 ml  Net -542.55 ml    I/O since admission: -[place]   12/22/16 1830  Weight: 171 lb 8.3 oz (77.8 kg)    Telemetry    Sinus rhythm 73; rare PVCs - Personally Reviewed  ECG    ECG (independently read by me): NSR at 71; Nonspecific T wave changes; QTc 462  Physical Exam   BP (!) 148/93   Pulse 77   Temp 98.1 F (36.7 C) (Oral)   Resp 13   Ht 5\' 6"  (1.676 m)   Wt 171 lb 8.3 oz (77.8 kg)   SpO2 97%   BMI 27.68 kg/m  General: Alert, oriented, no distress.  Skin: normal turgor, no rashes, warm and dry HEENT: Normocephalic, atraumatic. Pupils equal round and reactive to light; sclera anicteric; extraocular muscles intact;  Nose without nasal septal hypertrophy Mouth/Parynx benign; Mallinpatti scale 3 Neck: No JVD, no carotid bruits; normal carotid  upstroke Lungs: clear to ausculatation and percussion; no wheezing or rales Chest wall: without tenderness to palpitation Heart: PMI not displaced, RRR, s1 s2 normal, 1/6 systolic murmur, no diastolic murmur, no rubs, gallops, thrills, or heaves Abdomen: soft, nontender; no hepatosplenomehaly, BS+; abdominal aorta nontender and not dilated by palpation. Back: no CVA tenderness Pulses 2+;   IABP in R groin; no hematoma or ecchymosis Musculoskeletal: full range of motion, normal strength, no joint deformities Extremities: ecchymosis, hematoma dorsum of left hand Neurologic: grossly nonfocal; Cranial nerves grossly wnl Psychologic: Normal mood and affect   Labs    Chemistry Recent Labs  Lab 12/21/16 1812 12/23/16 0611  NA 137 138  K 4.0 3.2*  CL 106 105  CO2 24 23  GLUCOSE 147* 87  BUN 13 8  CREATININE 0.85 0.78  CALCIUM 9.0 8.5*  PROT 6.7 5.7*  ALBUMIN 3.7 3.0*  AST 29 23  ALT 17 15  ALKPHOS 104 82  BILITOT 1.6* 2.0*  GFRNONAA >60 >60  GFRAA >60 >60  ANIONGAP 7 10     Hematology Recent Labs  Lab 12/21/16 1812 12/23/16 0611  WBC 6.5 6.1  RBC 4.40 4.04  HGB 12.9 11.8*  HCT 38.6 36.0  MCV 87.7 89.1  MCH 29.3 29.2  MCHC 33.5 32.8  RDW 13.8 13.5  PLT 178 149*    Cardiac Enzymes Recent Labs  Lab 12/21/16 1812 12/21/16 2251 12/22/16 0437 12/22/16 1449  TROPONINI <0.03 0.04* 0.29* 0.44*   No results for input(s): TROPIPOC in the last 168 hours.   BNPNo results for input(s): BNP, PROBNP in the last 168 hours.   DDimer No results for input(s): DDIMER in the last 168 hours.   Lipid Panel     Component Value Date/Time   CHOL 121 12/22/2016 0437   CHOL 136 11/04/2013 0541   TRIG 129 12/22/2016 0437   TRIG 329 (H) 11/04/2013 0541   HDL 48 12/22/2016 0437   HDL 56 11/04/2013 0541   CHOLHDL 2.5 12/22/2016 0437   VLDL 26 12/22/2016 0437   VLDL 66 (H) 11/04/2013 0541   LDLCALC 47 12/22/2016 0437   LDLCALC 14 11/04/2013 0541    Radiology    Ct Abdomen  Pelvis W Contrast  Addendum Date: 12/21/2016   ADDENDUM REPORT: 12/21/2016 19:52 ADDENDUM: This is an addendum to the original report. The pneumobilia identified on today's study was present on study from 07/08/2016 but is a new finding when compared with 10/29/2011. Therefore, this may reflect a benign process such as pneumobilia due to instrumentation. Further. in the absence of signs or symptoms of infection this makes the original consideration for cholangitis less likely. The filling defects discussed in today's report however appear to be a new finding from 10/29/2011. I cannot confidently confirm the these were present on study from 07/08/2016. And MRCP may be helpful to confirm presence of choledocholithiasis. These findings were discussed with Dr. Merlyn Lot at 12/21/2016, 7:51 p.m. Electronically Signed   By: Kerby Moors M.D.   On: 12/21/2016 19:52   Result Date: 12/21/2016 CLINICAL DATA:  Abdominal pain and fever. EXAM: CT ABDOMEN AND PELVIS WITH CONTRAST TECHNIQUE: Multidetector CT imaging of the abdomen and pelvis was performed using the standard protocol following bolus administration of intravenous contrast. CONTRAST:  190mL ISOVUE-300 IOPAMIDOL (ISOVUE-300) INJECTION 61% COMPARISON:  07/08/2016 FINDINGS: Lower chest: There is a small right pleural effusion. Unchanged from previous exam. Hepatobiliary: No focal liver abnormality. Pneumobilia is identified status post cholecystectomy. This is new when compared with 07/08/2016 and 10/29/2011. Soft tissue attenuating filling defects within the common bile duct are identified which appear new from previous exam. Pancreas: Unremarkable. No pancreatic ductal dilatation or surrounding inflammatory changes. Spleen: Normal in size without focal abnormality. Adrenals/Urinary Tract: The adrenal glands appear normal. Unremarkable appearance of the kidneys. No mass or hydronephrosis. Urinary bladder is unremarkable. Stomach/Bowel: The stomach is  normal. The small bowel loops have a normal course and caliber without obstruction. The appendix is not visualized. No secondary signs of acute appendicitis. Vascular/Lymphatic: Aortic atherosclerosis. Branch vessel aortic atherosclerotic calcifications noted. No aneurysm. No adenopathy within the abdomen or pelvis. Reproductive: Status post hysterectomy. No adnexal masses. Other: No free fluid or fluid collections. Musculoskeletal: Degenerative disc disease noted within the lumbar spine. IMPRESSION: 1. Interval development of pneumobilia. There also several soft tissue attenuating filling defects within the common bile duct which may represent sequelae of inflammation/ infection or choledocholithiasis, this is new from 07/08/2016. In the setting of suspected infection and elevated bilirubin cholangitis cannot be excluded. 2. No intra-abdominal abscess identified. 3.  Aortic Atherosclerosis (ICD10-I70.0). 4. Chronic right pleural effusion. Electronically Signed: By: Kerby Moors M.D. On: 12/21/2016 19:24   Dg Chest Port 1 View  Result Date: 12/23/2016  CLINICAL DATA:  Chest soreness, intra aortic balloon pump placed yesterday. EXAM: PORTABLE CHEST 1 VIEW COMPARISON:  Portable chest x-ray of December 22, 2016 FINDINGS: Again demonstrated is the IABP marker that projects over inferior aspect of the aortic arch. The cardiac silhouette is top-normal in size. The interstitium within the mid and lower aspects of both lungs is more conspicuous today. There is no alveolar infiltrate, pleural effusion, or pneumothorax. There is dense calcification in the wall of the thoracic aorta. IMPRESSION: Stable positioning of the intra-aortic balloon pump marker. Mildly increased interstitial markings in the mid and lower lungs bilaterally may reflect low-grade interstitial edema. Thoracic aortic atherosclerosis. Electronically Signed   By: David  Martinique M.D.   On: 12/23/2016 07:52   Dg Chest Port 1 View  Result Date:  12/22/2016 CLINICAL DATA:  History of chest pain and shortness of breath. Intra-aortic balloon pump in place. Patient status post cardiac catheterization today. EXAM: PORTABLE CHEST 1 VIEW COMPARISON:  Single-view of the chest 12/22/2016. FINDINGS: Marker for the patient's intra-aortic balloon pump projects at the inferior margin of the aortic arch. Trace right pleural effusion is noted. Lungs are clear without edema. No pneumothorax. Heart size is normal. Aortic atherosclerosis noted. IMPRESSION: Marker for intra-aortic balloon pump projects at the inferior aspect of the aortic arch. Tiny right pleural effusion. Electronically Signed   By: Inge Rise M.D.   On: 12/22/2016 19:58   Dg Chest Port 1 View  Result Date: 12/22/2016 CLINICAL DATA:  83 year old female with history of coronary artery disease and hypertension is status post left heart catheterization and coronary angiography today. Right lower lobectomy. EXAM: PORTABLE CHEST 1 VIEW COMPARISON:  12/21/2016 CXR and 07/08/2016 CT FINDINGS: Stable cardiomegaly with moderate aortic atherosclerosis. Surgical clips project over the cardiac silhouette and mediastinum. There is stable blunting of the right cardiophrenic and right lateral costophrenic angles that appears to be associated with postop change from previous reported right lower lobectomy. No acute pneumonic consolidation or CHF. No pneumothorax. No acute nor suspicious osseous lesions. IMPRESSION: 1. Chronic stable cardiomegaly with aortic atherosclerosis. 2. Stable postop change at the right lung with blunting of the right cardiophrenic and costophrenic angles. 3. No active pulmonary disease. Electronically Signed   By: Ashley Royalty M.D.   On: 12/22/2016 15:00   Dg Chest Portable 1 View  Result Date: 12/21/2016 CLINICAL DATA:  Epigastric pain EXAM: PORTABLE CHEST 1 VIEW COMPARISON:  07/08/2016 FINDINGS: Cardiac shadow is stable. Aortic calcifications are again seen. The lungs are well  aerated. Postsurgical changes are noted on the right with mild volume loss. No acute infiltrate or sizable effusion is seen. No bony abnormality is noted. IMPRESSION: Postoperative changes on the right.  No acute abnormality seen. Electronically Signed   By: Inez Catalina M.D.   On: 12/21/2016 18:17    Cardiac Studies   Cardiac Cath Conclusion   Conclusions: 1. Severe, three vessel coronary artery disease, including 90% distal LMCA stenosis involving the ostia of the LAD and LCx, as well as chronic total occlusion of the RCA. Distal RCA fills via left-to-right collaterals. 2. Mildly elevated left ventricular filling pressure. 3. Abdominal aortic atherosclerosis and calcification without significant stenosis and aneurysm. 4. Successful placement of 40 mL intraaortic balloon pump via the right common femoral artery.  Recommendations: 1. Transfer to [facility] for cardiac surgery consultation. Though the patient is DNR, she was active until her chest pain began ~6 months ago. She is willing to rescind the DNR order for procedures. Surgical revascularization  is preferable over PCI, as PCI would require Impella support and atherectomy with significant risk of morbidity and mortality. 2. Initiate heparin infusion (no bolus) 2 hours after removal of right radial artery sheath. 3. Discontinue clopidogrel. Last dose was given this morning (12/22/16). 4. NTG infusion titrated to relief of chest pain. 5. Increase statin therapy. 6. Obtain echo today for evaluation of LV function.    ------------------------------------------------------------------- ECHO Study Conclusions  - Left ventricle: The cavity size was normal. Wall thickness was normal. Systolic function was normal. The estimated ejection fraction was in the range of 60% to 65%. Doppler parameters are consistent with abnormal left ventricular relaxation (grade 1 diastolic dysfunction). - Aortic valve: Mildly thickened  leaflets. - Mitral valve: Calcified annulus. Mildly thickened leaflets . - Right ventricle: Poorly visualized. The cavity size was normal. Systolic function was normal. - Right atrium: The atrium was mildly dilated.    Patient Profile     83 y.o. female who was transferred from [facility] after cath yesterday demonstrated critical life threatening coronary anatomy with IABP in place..  Assessment & Plan    1. Critical life threatening multivessel CAD:  95% calcified eccentric LM stenosis extending into ostium of LAD and LCX with total occlusion of RCA and left to right collaterals. I have personally reviewed the cines along with Dr. Burt Knack. Pt has optimal surgical anatomy; We believe the risk of PCI is very high even with Impella hemodynamic support  particularly with the total RCA collateralized from the left and projected surgical mortality may be lower than PCI.  I have had over a hour discussion with patient and family members. I have reviewed the PCI option and detail. Distal targets for CABG are good. The patient was unable to do any activity as a result of severe coronary stenosis and this should significantly improve if she is revascularized. The had not thought of this when they had previously made their decision against surgery.  After a prolonged discussion of both PCI vs CABG the patient and family are now all in agreement to pursue surgical revascularization  which should provide greater long term benefit compared to PCI attempt. They are fully aware of surgical mortality risk, potential for morbidity and possible subsequent need for nursing home. I have discussed with Dr. Roxy Manns.  The family and patient agree to cancel the DNR status. Plan surgery today.  Last dose of clopidogrel was 12/22/16.   2. IABP: stable hemodynamics with diastolic augmentation to 924 mmHg. Continue heparin.   3. Hypokalemia: K 3.2; will give 3 runs os 10 meq iv KCL.   4. Essential hypertension: Will  keep NPO for surgery today.  Continue iv NTG and titrate as needed. Hold losartan.  4. Hyperlipidemia: on atorvastatin.   5. Code status changed to full code.   6. Left hand ecchymosis/hematoma:  Pt on heparin. Apply ice or ace wrap.  Time spent 90 minutes  Signed, Troy Sine, MD, [facility] 12/23/2016, 8:38 AM

## 2016-12-23 NOTE — Progress Notes (Signed)
ANTICOAGULATION CONSULT NOTE  Pharmacy Consult:  Heparin Indication:  IABP  Allergies  Allergen Reactions  . Biaxin [Clarithromycin]     depressed  . Celecoxib Other (See Comments)    Reaction: Unknown  . Ciprofloxacin   . Codeine Other (See Comments)    Reaction: Unknown  . Hydromorphone   . Lisinopril     cough  . Macrobid [employer] Macro] Other (See Comments)    Reaction: Unknown  . Oxycodone Hcl Other (See Comments)    Reaction: Unknown  . Penicillins Other (See Comments)    Reaction: Unknown  . Prednisone   . Sertraline Hcl Other (See Comments)    Reaction: Unknown    Patient Measurements: Height: 5\' 6"  (167.6 cm) Weight: 171 lb 8.3 oz (77.8 kg) IBW/kg (Calculated) : 59.3 Heparin Dosing Weight: 78kg  Vital Signs: Temp: 98.1 F (36.7 C) (12/12 0758) Temp Source: Oral (12/12 0758) BP: 148/93 (12/12 0800) Pulse Rate: 77 (12/12 0800)  Labs: Recent Labs    12/21/16 1812 12/21/16 2251 12/22/16 0437 12/22/16 0552 12/22/16 1449 12/22/16 2141 12/23/16 0543 12/23/16 0611  HGB 12.9  --   --   --   --   --   --  11.8*  HCT 38.6  --   --   --   --   --   --  36.0  PLT 178  --   --   --   --   --   --  149*  APTT  --   --   --  30  --   --   --   --   LABPROT  --   --   --  12.7  --   --   --   --   INR  --   --   --  0.96  --   --   --   --   HEPARINUNFRC  --   --   --   --   --  0.38 0.26*  --   CREATININE 0.85  --   --   --   --   --   --  0.78  TROPONINI <0.03 0.04* 0.29*  --  0.44*  --   --   --     Estimated Creatinine Clearance: 55.1 mL/min (by C-G formula based on SCr of 0.78 mg/dL).    Assessment: 83 y.o. female with CAD awaiting PCI, IABP in place, for heparin. Patient discussed surgery options with CVTS and declined.   Follow up heparin level this morning is at goal, no issues noted. Hgb down slightly but stable overall, pltc trending down 178>149 will follow closely.   Goal of Therapy:  Heparin level 0.2-0.5 units/mL Monitor  platelets by anticoagulation protocol: Yes   Plan:  Continue Heparin at current rate  Follow-up daily labs.  Erin Hearing PharmD., BCPS Clinical Pharmacist Pager [phone] 12/23/2016 8:47 AM

## 2016-12-23 NOTE — Brief Op Note (Signed)
12/22/2016 - 12/23/2016  6:47 PM  PATIENT:  [name]  83 y.o. female  PRE-OPERATIVE DIAGNOSIS:  coronary artery disease  POST-OPERATIVE DIAGNOSIS:  coronary artery disease  PROCEDURE:  Procedure(s): CORONARY ARTERY BYPASS GRAFTING (CABG) TIMES THREE USING LEFT INTERNAL MAMMARY ARTERY AND LEFT SAPHENOUS LEG VEIN HARVESTED ENDOSCOPICALLY, (N/A) TRANSESOPHAGEAL ECHOCARDIOGRAM (TEE) (N/A)  LIMA to LAD SVG to PDA SVG to OM1  SURGEON:  Surgeon(s) and Role:    * Rexene Alberts, MD - Primary  PHYSICIAN ASSISTANT:  Nicholes Rough, PA-C    ANESTHESIA:   general  EBL:  800 mL   BLOOD ADMINISTERED:none  DRAINS: ROUTINE   LOCAL MEDICATIONS USED:  NONE  SPECIMEN:  No Specimen  DISPOSITION OF SPECIMEN: n/a  COUNTS:  YES  DICTATION: .Dragon Dictation  PLAN OF CARE: Admit to inpatient   PATIENT DISPOSITION:  ICU - intubated and hemodynamically stable.   Delay start of Pharmacological VTE agent (>24hrs) due to surgical blood loss or risk of bleeding: yes

## 2016-12-23 NOTE — Progress Notes (Signed)
ANTICOAGULATION CONSULT NOTE  Pharmacy Consult:  Heparin Indication:  IABP  Allergies  Allergen Reactions  . Biaxin [Clarithromycin]     depressed  . Celecoxib Other (See Comments)    Reaction: Unknown  . Ciprofloxacin   . Codeine Other (See Comments)    Reaction: Unknown  . Hydromorphone   . Lisinopril     cough  . Macrobid [employer] Macro] Other (See Comments)    Reaction: Unknown  . Oxycodone Hcl Other (See Comments)    Reaction: Unknown  . Penicillins Other (See Comments)    Reaction: Unknown  . Prednisone   . Sertraline Hcl Other (See Comments)    Reaction: Unknown    Patient Measurements: Height: 5\' 6"  (167.6 cm) Weight: 171 lb 8.3 oz (77.8 kg) IBW/kg (Calculated) : 59.3 Heparin Dosing Weight: 78kg  Vital Signs: Temp: 98 F (36.7 C) (12/11 2308) Temp Source: Oral (12/11 2308) BP: 158/65 (12/11 2300) Pulse Rate: 74 (12/11 2300)  Labs: Recent Labs    12/21/16 1812 12/21/16 2251 12/22/16 0437 12/22/16 0552 12/22/16 1449 12/22/16 2141  HGB 12.9  --   --   --   --   --   HCT 38.6  --   --   --   --   --   PLT 178  --   --   --   --   --   APTT  --   --   --  30  --   --   LABPROT  --   --   --  12.7  --   --   INR  --   --   --  0.96  --   --   HEPARINUNFRC  --   --   --   --   --  0.38  CREATININE 0.85  --   --   --   --   --   TROPONINI <0.03 0.04* 0.29*  --  0.44*  --     Estimated Creatinine Clearance: 51.9 mL/min (by C-G formula based on SCr of 0.85 mg/dL).    Assessment: 83 y.o. female with CAD awaiting PIC, IABP in place, for heparin  Goal of Therapy:  Heparin level 0.2-0.5 units/mL Monitor platelets by anticoagulation protocol: Yes   Plan:  Continue Heparin at current rate  Follow-up am labs.  Phillis Knack, PharmD, BCPS  12/23/2016, 12:13 AM

## 2016-12-23 NOTE — Progress Notes (Signed)
TCTS BRIEF SICU PROGRESS NOTE  After extensive discussion w/ Dr Kelly, Mrs Swamy has changed her mind and now wishes to proceed with high risk CABG.  I have again met briefly with Mrs Solano and her family.  All questions answered.  Plan: To OR for CABG  Clarence H Owen, MD 12/23/2016 1:50 PM   

## 2016-12-24 ENCOUNTER — Inpatient Hospital Stay (HOSPITAL_COMMUNITY): Payer: Medicare Other

## 2016-12-24 ENCOUNTER — Encounter (HOSPITAL_COMMUNITY): Payer: Self-pay | Admitting: Thoracic Surgery (Cardiothoracic Vascular Surgery)

## 2016-12-24 DIAGNOSIS — Z951 Presence of aortocoronary bypass graft: Secondary | ICD-10-CM

## 2016-12-24 LAB — BASIC METABOLIC PANEL
Anion gap: 9 (ref 5–15)
BUN: 11 mg/dL (ref 6–20)
CHLORIDE: 111 mmol/L (ref 101–111)
CO2: 19 mmol/L — AB (ref 22–32)
CREATININE: 0.83 mg/dL (ref 0.44–1.00)
Calcium: 7.3 mg/dL — ABNORMAL LOW (ref 8.9–10.3)
GFR calc Af Amer: >60 mL/min (ref >60)
GFR calc non Af Amer: >60 mL/min (ref >60)
Glucose, Bld: 151 mg/dL — ABNORMAL HIGH (ref 65–99)
Potassium: 3.5 mmol/L (ref 3.5–5.1)
Sodium: 139 mmol/L (ref 135–145)

## 2016-12-24 LAB — POCT I-STAT 3, ART BLOOD GAS (G3+)
ACID-BASE DEFICIT: 2 mmol/L (ref 0.0–2.0)
ACID-BASE DEFICIT: 5 mmol/L — AB (ref 0.0–2.0)
Acid-base deficit: 7 mmol/L — ABNORMAL HIGH (ref 0.0–2.0)
Acid-base deficit: 3 mmol/L — ABNORMAL HIGH (ref 0.0–2.0)
BICARBONATE: 17.3 mmol/L — AB (ref 20.0–28.0)
BICARBONATE: 21 mmol/L (ref 20.0–28.0)
BICARBONATE: 20.9 mmol/L (ref 20.0–28.0)
Bicarbonate: 22 mmol/L (ref 20.0–28.0)
O2 SAT: 100 %
O2 SAT: 99 %
O2 Saturation: 100 %
O2 Saturation: 99 %
PCO2 ART: 26.7 mmHg — AB (ref 32.0–48.0)
PCO2 ART: 41.2 mmHg (ref 32.0–48.0)
PH ART: 7.419 (ref 7.350–7.450)
PO2 ART: 242 mmHg — AB (ref 83.0–108.0)
PO2 ART: 223 mmHg — AB (ref 83.0–108.0)
Patient temperature: 36.4
Patient temperature: 37.2
Patient temperature: 37.3
TCO2: 18 mmol/L — AB (ref 22–32)
TCO2: 22 mmol/L (ref 22–32)
TCO2: 23 mmol/L (ref 22–32)
TCO2: 22 mmol/L (ref 22–32)
pCO2 arterial: 29.7 mmHg — ABNORMAL LOW (ref 32.0–48.0)
pCO2 arterial: 40.7 mmHg (ref 32.0–48.0)
pH, Arterial: 7.458 — ABNORMAL HIGH (ref 7.350–7.450)
pH, Arterial: 7.336 — ABNORMAL LOW (ref 7.350–7.450)
pH, Arterial: 7.319 — ABNORMAL LOW (ref 7.350–7.450)
pO2, Arterial: 162 mmHg — ABNORMAL HIGH (ref 83.0–108.0)
pO2, Arterial: 127 mmHg — ABNORMAL HIGH (ref 83.0–108.0)

## 2016-12-24 LAB — GLUCOSE, CAPILLARY
GLUCOSE-CAPILLARY: 108 mg/dL — AB (ref 65–99)
GLUCOSE-CAPILLARY: 109 mg/dL — AB (ref 65–99)
GLUCOSE-CAPILLARY: 112 mg/dL — AB (ref 65–99)
GLUCOSE-CAPILLARY: 117 mg/dL — AB (ref 65–99)
GLUCOSE-CAPILLARY: 122 mg/dL — AB (ref 65–99)
GLUCOSE-CAPILLARY: 129 mg/dL — AB (ref 65–99)
GLUCOSE-CAPILLARY: 144 mg/dL — AB (ref 65–99)
GLUCOSE-CAPILLARY: 152 mg/dL — AB (ref 65–99)
GLUCOSE-CAPILLARY: 154 mg/dL — AB (ref 65–99)
Glucose-Capillary: 114 mg/dL — ABNORMAL HIGH (ref 65–99)
Glucose-Capillary: 121 mg/dL — ABNORMAL HIGH (ref 65–99)
Glucose-Capillary: 124 mg/dL — ABNORMAL HIGH (ref 65–99)
Glucose-Capillary: 126 mg/dL — ABNORMAL HIGH (ref 65–99)
Glucose-Capillary: 129 mg/dL — ABNORMAL HIGH (ref 65–99)
Glucose-Capillary: 137 mg/dL — ABNORMAL HIGH (ref 65–99)
Glucose-Capillary: 138 mg/dL — ABNORMAL HIGH (ref 65–99)
Glucose-Capillary: 161 mg/dL — ABNORMAL HIGH (ref 65–99)
Glucose-Capillary: 168 mg/dL — ABNORMAL HIGH (ref 65–99)

## 2016-12-24 LAB — POCT I-STAT, CHEM 8
BUN: 11 mg/dL (ref 6–20)
CREATININE: 0.7 mg/dL (ref 0.44–1.00)
Calcium, Ion: 1.18 mmol/L (ref 1.15–1.40)
Chloride: 105 mmol/L (ref 101–111)
Glucose, Bld: 156 mg/dL — ABNORMAL HIGH (ref 65–99)
HEMATOCRIT: 24 % — AB (ref 36.0–46.0)
HEMOGLOBIN: 8.2 g/dL — AB (ref 12.0–15.0)
POTASSIUM: 4.3 mmol/L (ref 3.5–5.1)
SODIUM: 141 mmol/L (ref 135–145)
TCO2: 23 mmol/L (ref 22–32)

## 2016-12-24 LAB — CBC
HEMATOCRIT: 26.6 % — AB (ref 36.0–46.0)
HEMATOCRIT: 25.7 % — AB (ref 36.0–46.0)
HEMOGLOBIN: 9.2 g/dL — AB (ref 12.0–15.0)
HEMOGLOBIN: 8.6 g/dL — AB (ref 12.0–15.0)
MCH: 29.5 pg (ref 26.0–34.0)
MCH: 29 pg (ref 26.0–34.0)
MCHC: 34.6 g/dL (ref 30.0–36.0)
MCHC: 33.5 g/dL (ref 30.0–36.0)
MCV: 85.3 fL (ref 78.0–100.0)
MCV: 86.5 fL (ref 78.0–100.0)
Platelets: 91 10*3/uL — ABNORMAL LOW (ref 150–400)
Platelets: 124 10*3/uL — ABNORMAL LOW (ref 150–400)
RBC: 3.12 MIL/uL — ABNORMAL LOW (ref 3.87–5.11)
RBC: 2.97 MIL/uL — ABNORMAL LOW (ref 3.87–5.11)
RDW: 13.9 % (ref 11.5–15.5)
RDW: 14.7 % (ref 11.5–15.5)
WBC: 8.5 10*3/uL (ref 4.0–10.5)
WBC: 13.6 10*3/uL — AB (ref 4.0–10.5)

## 2016-12-24 LAB — ECHO INTRAOPERATIVE TEE
Height: 66 in
Weight: 2744.29 oz

## 2016-12-24 LAB — MAGNESIUM
MAGNESIUM: 2.1 mg/dL (ref 1.7–2.4)
Magnesium: 2.5 mg/dL — ABNORMAL HIGH (ref 1.7–2.4)

## 2016-12-24 LAB — CREATININE, SERUM: Creatinine, Ser: 0.84 mg/dL (ref 0.44–1.00)

## 2016-12-24 LAB — POCT ACTIVATED CLOTTING TIME: Activated Clotting Time: 131 seconds

## 2016-12-24 MED ORDER — POTASSIUM CHLORIDE 10 MEQ/50ML IV SOLN
10.0000 meq | INTRAVENOUS | Status: AC
  Administered 2016-12-24 (×3): 10 meq via INTRAVENOUS
  Filled 2016-12-24 (×3): qty 50

## 2016-12-24 MED ORDER — FUROSEMIDE 10 MG/ML IJ SOLN
20.0000 mg | Freq: Four times a day (QID) | INTRAMUSCULAR | Status: AC
  Administered 2016-12-24 (×3): 20 mg via INTRAVENOUS
  Filled 2016-12-24 (×3): qty 2

## 2016-12-24 MED ORDER — METOCLOPRAMIDE HCL 5 MG/ML IJ SOLN
10.0000 mg | Freq: Four times a day (QID) | INTRAMUSCULAR | Status: DC
  Administered 2016-12-24 – 2017-01-01 (×12): 10 mg via INTRAVENOUS
  Filled 2016-12-24 (×17): qty 2

## 2016-12-24 MED ORDER — POTASSIUM CHLORIDE 10 MEQ/50ML IV SOLN
10.0000 meq | INTRAVENOUS | Status: AC | PRN
  Administered 2016-12-24 (×3): 10 meq via INTRAVENOUS
  Filled 2016-12-24: qty 50

## 2016-12-24 MED ORDER — INSULIN ASPART 100 UNIT/ML ~~LOC~~ SOLN
0.0000 [IU] | SUBCUTANEOUS | Status: DC
  Administered 2016-12-24 – 2016-12-25 (×3): 2 [IU] via SUBCUTANEOUS

## 2016-12-24 NOTE — Progress Notes (Signed)
Attempted to start rapid wean protocol again, RR set to 4.  After 5 minutes, patient's RR dropped to 6 sustained.  Placed back on full support, with rate to 10.

## 2016-12-24 NOTE — Plan of Care (Signed)
  Safety: Ability to remain free from injury will improve 12/24/2016 0319 - Progressing by Tish Frederickson, RN   Pain Managment: General experience of comfort will improve 12/24/2016 0319 - Progressing by Tish Frederickson, RN   Clinical Measurements: Respiratory complications will improve 12/24/2016 0319 - Not Progressing by Tish Frederickson, RN

## 2016-12-24 NOTE — Progress Notes (Signed)
Attempted to start rapid vent wean on patient who followed commands.  Patient's resp rate dropped below 8 with minimal effort.  Placed back on full support until more awake.

## 2016-12-24 NOTE — Progress Notes (Signed)
[address]       [place] [address]             [phone]        CARDIOTHORACIC SURGERY PROGRESS NOTE   R1 Day Post-Op Procedure(s) (LRB): CORONARY ARTERY BYPASS GRAFTING (CABG) TIMES THREE USING LEFT INTERNAL MAMMARY ARTERY AND LEFT SAPHENOUS LEG VEIN HARVESTED ENDOSCOPICALLY, (N/A) TRANSESOPHAGEAL ECHOCARDIOGRAM (TEE) (N/A)  Subjective: extubated this morning.  Alert and oriented, neuro intact.  Complains of feeling anxious and nauseated  Objective: Vital signs: BP Readings from Last 1 Encounters:  12/24/16 122/62   Pulse Readings from Last 1 Encounters:  12/24/16 (!) 31   Resp Readings from Last 1 Encounters:  12/24/16 13   Temp Readings from Last 1 Encounters:  12/24/16 99.1 F (37.3 C)    Hemodynamics: PAP: (23-28)/(1-10) 23/1 CO:  [3 L/min-4.5 L/min] 4.5 L/min CI:  [1.6 L/min/m2-2.4 L/min/m2] 2.4 L/min/m2  Physical Exam:  Rhythm:   sinus  Breath sounds: clear  Heart sounds:  RRR  Incisions:  Dressings dry, intact  Abdomen:  Soft, non-distended, non-tender  Extremities:  Warm, well-perfused  Chest tubes:  low volume thin serosanguinous output, no air leak    Intake/Output from previous day: 12/12 0701 - 12/13 0700 In: 6731.1 [I.V.:3947.1; Blood:734; IV [id number]:8101] Out: 7510 [Urine:1040; Blood:800; Chest Tube:470] Intake/Output this shift: No intake/output data recorded.  Lab Results:  CBC: Recent Labs    12/23/16 1918 12/23/16 1925 12/24/16 0231  WBC 9.0  --  8.5  HGB 8.2* 6.8* 9.2*  HCT 24.3* 20.0* 26.6*  PLT 93*  --  91*    BMET:  Recent Labs    12/23/16 0611  12/23/16 1818 12/23/16 1925 12/24/16 0231  NA 138   < > 138 141 139  K 3.2*   < > 3.8 3.8 3.5  CL 105   < > 102  --  111  CO2 23  --   --   --  19*  GLUCOSE 87   < > 131* 124* 151*  BUN 8   < > 8  --  11  CREATININE 0.78   < > 0.40*  --  0.83  CALCIUM 8.5*  --   --   --  7.3*   < > = values in this interval not displayed.     PT/INR:     Recent Labs    12/23/16 1918  LABPROT 20.2*  INR 1.74    CBG (last 3)  Recent Labs    12/24/16 0537 12/24/16 0639 12/24/16 0735  GLUCAP 137* 121* 112*    ABG    Component Value Date/Time   PHART 7.319 (L) 12/24/2016 0715   PCO2ART 40.7 12/24/2016 0715   PO2ART 127.0 (H) 12/24/2016 0715   HCO3 20.9 12/24/2016 0715   TCO2 22 12/24/2016 0715   ACIDBASEDEF 5.0 (H) 12/24/2016 0715   O2SAT 99.0 12/24/2016 0715    CXR: Clear  EKG: NSR w/out acute ischemic changes    Assessment/Plan: S/P Procedure(s) (LRB): CORONARY ARTERY BYPASS GRAFTING (CABG) TIMES THREE USING LEFT INTERNAL MAMMARY ARTERY AND LEFT SAPHENOUS LEG VEIN HARVESTED ENDOSCOPICALLY, (N/A) TRANSESOPHAGEAL ECHOCARDIOGRAM (TEE) (N/A)  Doing well POD1 Maintaining NSR w/ stable BP, on NTG for HTN Breathing comfortably w/ O2 sats 100% on 4 L/min Expected post op acute blood loss anemia, stable Expected post op atelectasis, mild Expected post op volume excess, weight reportedly weight reportedly 17 lbs > preop Post op thrombocytopenia, platelet count stable 91k Chronic anxiety,  depression   D/C IABP  Mobilize once IABP out  D/C lines  Gentle diuresis D/C tubes later today or tomorrow, depending on output   Rexene Alberts, MD 12/24/2016 8:00 AM

## 2016-12-24 NOTE — Progress Notes (Signed)
TCTS BRIEF SICU PROGRESS NOTE  1 Day Post-Op  S/P Procedure(s) (LRB): CORONARY ARTERY BYPASS GRAFTING (CABG) TIMES THREE USING LEFT INTERNAL MAMMARY ARTERY AND LEFT SAPHENOUS LEG VEIN HARVESTED ENDOSCOPICALLY, (N/A) TRANSESOPHAGEAL ECHOCARDIOGRAM (TEE) (N/A)   Stable day IABP d/c'd uneventfully  Plan: Continue routine early postop.  Mobilize  Rexene Alberts, MD 12/24/2016 7:50 PM

## 2016-12-24 NOTE — Anesthesia Postprocedure Evaluation (Signed)
Anesthesia Post Note  Patient: [name]  Procedure(s) Performed: CORONARY ARTERY BYPASS GRAFTING (CABG) TIMES THREE USING LEFT INTERNAL MAMMARY ARTERY AND LEFT SAPHENOUS LEG VEIN HARVESTED ENDOSCOPICALLY, (N/A Chest) TRANSESOPHAGEAL ECHOCARDIOGRAM (TEE) (N/A )     Patient location during evaluation: SICU Anesthesia Type: General Level of consciousness: sedated Pain management: pain level controlled Vital Signs Assessment: post-procedure vital signs reviewed and stable Respiratory status: patient remains intubated per anesthesia plan Cardiovascular status: stable Postop Assessment: no apparent nausea or vomiting Anesthetic complications: no    Last Vitals:  Vitals:   12/24/16 0700 12/24/16 0800  BP: (!) 125/58 122/61  Pulse: (!) 30 (!) 30  Resp: 19 15  Temp: 37.2 C 37.2 C  SpO2: 100% 100%    Last Pain:  Vitals:   12/23/16 2338  TempSrc:   PainSc: 4                  Alizon Schmeling DAVID

## 2016-12-24 NOTE — Progress Notes (Signed)
Removal of IABP via right groin without complication. Manual compression applied for 30 minutes. Hemostasis achieved. BP; 142/75, HR: 73, SP02: 96%. Distal pulses present Dp+2. Sterile 4x4 gauze applied to insertion site with tape to secure. Dressing dry and intact. Pateint understands post recovery instructions.

## 2016-12-24 NOTE — Procedures (Signed)
Extubation Procedure Note  Patient Details:   Name: [name] DOB: Oct 19, 1930 MRN: [id number]   Airway Documentation:   Patient extubated to 4 lpm nasal cannula.  VC 750ml, NIF -30, patient able to hold head off bed.  Patient able to breathe around deflated cuff and speak post procedure.  Tolerated well, no complications.  Evaluation  O2 sats: stable throughout Complications: No apparent complications Patient did tolerate procedure well. Bilateral Breath Sounds: Clear   Yes  Tatyanna Cronk, Elwyn Lade 12/24/2016, 6:18 AM

## 2016-12-24 NOTE — Progress Notes (Signed)
Pt left hand more edematous. Hand elevated, wrapped with ace and ice applied. MD notified. No new orders at this time.

## 2016-12-25 ENCOUNTER — Inpatient Hospital Stay (HOSPITAL_COMMUNITY): Payer: Medicare Other

## 2016-12-25 LAB — GLUCOSE, CAPILLARY
GLUCOSE-CAPILLARY: 124 mg/dL — AB (ref 65–99)
GLUCOSE-CAPILLARY: 104 mg/dL — AB (ref 65–99)
GLUCOSE-CAPILLARY: 123 mg/dL — AB (ref 65–99)
Glucose-Capillary: 112 mg/dL — ABNORMAL HIGH (ref 65–99)

## 2016-12-25 LAB — CBC
HEMATOCRIT: 25.7 % — AB (ref 36.0–46.0)
HEMOGLOBIN: 8.4 g/dL — AB (ref 12.0–15.0)
MCH: 28.8 pg (ref 26.0–34.0)
MCHC: 32.7 g/dL (ref 30.0–36.0)
MCV: 88 fL (ref 78.0–100.0)
Platelets: 112 10*3/uL — ABNORMAL LOW (ref 150–400)
RBC: 2.92 MIL/uL — AB (ref 3.87–5.11)
RDW: 15 % (ref 11.5–15.5)
WBC: 11.3 10*3/uL — ABNORMAL HIGH (ref 4.0–10.5)

## 2016-12-25 LAB — BASIC METABOLIC PANEL
ANION GAP: 5 (ref 5–15)
BUN: 14 mg/dL (ref 6–20)
CALCIUM: 8.3 mg/dL — AB (ref 8.9–10.3)
CO2: 25 mmol/L (ref 22–32)
Chloride: 108 mmol/L (ref 101–111)
Creatinine, Ser: 1.01 mg/dL — ABNORMAL HIGH (ref 0.44–1.00)
GFR calc Af Amer: 58 mL/min — ABNORMAL LOW (ref >60)
GFR calc non Af Amer: 50 mL/min — ABNORMAL LOW (ref >60)
GLUCOSE: 135 mg/dL — AB (ref 65–99)
Potassium: 4.4 mmol/L (ref 3.5–5.1)
Sodium: 138 mmol/L (ref 135–145)

## 2016-12-25 MED ORDER — PANTOPRAZOLE SODIUM 40 MG PO TBEC: 40.0000 mg | DELAYED_RELEASE_TABLET | Freq: Every day | ORAL | Status: DC

## 2016-12-25 MED ORDER — MOVING RIGHT ALONG BOOK
Freq: Once | Status: AC
  Administered 2016-12-25: 1
  Filled 2016-12-25: qty 1

## 2016-12-25 MED ORDER — FUROSEMIDE 10 MG/ML IJ SOLN
20.0000 mg | Freq: Four times a day (QID) | INTRAMUSCULAR | Status: AC
  Administered 2016-12-25 (×3): 20 mg via INTRAVENOUS
  Filled 2016-12-25 (×3): qty 2

## 2016-12-25 MED ORDER — AMIODARONE HCL IN DEXTROSE 360-4.14 MG/200ML-% IV SOLN
INTRAVENOUS | Status: AC
  Filled 2016-12-25: qty 200

## 2016-12-25 MED ORDER — POLYSACCHARIDE IRON COMPLEX 150 MG PO CAPS
150.0000 mg | ORAL_CAPSULE | Freq: Every day | ORAL | Status: DC
  Administered 2016-12-26 – 2017-01-02 (×8): 150 mg via ORAL
  Filled 2016-12-25 (×8): qty 1

## 2016-12-25 MED ORDER — ATORVASTATIN CALCIUM 80 MG PO TABS
80.0000 mg | ORAL_TABLET | Freq: Every day | ORAL | Status: DC
  Administered 2016-12-25 – 2017-01-02 (×8): 80 mg via ORAL
  Filled 2016-12-25 (×9): qty 1

## 2016-12-25 MED ORDER — ASPIRIN EC 81 MG PO TBEC
81.0000 mg | DELAYED_RELEASE_TABLET | Freq: Every day | ORAL | Status: DC
  Administered 2016-12-25 – 2017-01-02 (×9): 81 mg via ORAL
  Filled 2016-12-25 (×9): qty 1

## 2016-12-25 MED ORDER — ENOXAPARIN SODIUM 30 MG/0.3ML ~~LOC~~ SOLN
30.0000 mg | SUBCUTANEOUS | Status: DC
  Administered 2016-12-26 – 2017-01-01 (×6): 30 mg via SUBCUTANEOUS
  Filled 2016-12-25 (×6): qty 0.3

## 2016-12-25 MED ORDER — AMIODARONE HCL IN DEXTROSE 360-4.14 MG/200ML-% IV SOLN: 30.0000 mg/h | INTRAVENOUS | Status: DC

## 2016-12-25 MED ORDER — AMIODARONE HCL IN DEXTROSE 360-4.14 MG/200ML-% IV SOLN
60.0000 mg/h | INTRAVENOUS | Status: DC
  Administered 2016-12-25 (×2): 60 mg/h via INTRAVENOUS
  Filled 2016-12-25 (×2): qty 200

## 2016-12-25 MED ORDER — CLOPIDOGREL BISULFATE 75 MG PO TABS
75.0000 mg | ORAL_TABLET | Freq: Every day | ORAL | Status: DC
  Administered 2016-12-25 – 2017-01-02 (×9): 75 mg via ORAL
  Filled 2016-12-25 (×9): qty 1

## 2016-12-25 MED ORDER — AMIODARONE LOAD VIA INFUSION
150.0000 mg | Freq: Once | INTRAVENOUS | Status: AC
  Administered 2016-12-25: 150 mg via INTRAVENOUS
  Filled 2016-12-25: qty 83.34

## 2016-12-25 MED ORDER — FA-PYRIDOXINE-CYANOCOBALAMIN 2.5-25-2 MG PO TABS
1.0000 | ORAL_TABLET | Freq: Every day | ORAL | Status: DC
  Administered 2016-12-25 – 2017-01-02 (×9): 1 via ORAL
  Filled 2016-12-25 (×9): qty 1

## 2016-12-25 MED ORDER — LORAZEPAM 0.5 MG PO TABS
0.5000 mg | ORAL_TABLET | Freq: Three times a day (TID) | ORAL | Status: DC | PRN
  Administered 2016-12-25 – 2017-01-02 (×18): 0.5 mg via ORAL
  Filled 2016-12-25 (×18): qty 1

## 2016-12-25 MED ORDER — MORPHINE SULFATE (PF) 4 MG/ML IV SOLN
1.0000 mg | INTRAVENOUS | Status: DC | PRN
Start: 2016-12-25 — End: 2016-12-27

## 2016-12-25 MED FILL — Potassium Chloride Inj 2 mEq/ML: INTRAVENOUS | Qty: 40 | Status: AC

## 2016-12-25 MED FILL — Heparin Sodium (Porcine) Inj 1000 Unit/ML: INTRAMUSCULAR | Qty: 10 | Status: AC

## 2016-12-25 MED FILL — Electrolyte-R (PH 7.4) Solution: INTRAVENOUS | Qty: 4000 | Status: AC

## 2016-12-25 MED FILL — Sodium Chloride IV Soln 0.9%: INTRAVENOUS | Qty: 2000 | Status: AC

## 2016-12-25 MED FILL — Sodium Bicarbonate IV Soln 8.4%: INTRAVENOUS | Qty: 50 | Status: AC

## 2016-12-25 MED FILL — Magnesium Sulfate Inj 50%: INTRAMUSCULAR | Qty: 10 | Status: AC

## 2016-12-25 MED FILL — Lidocaine HCl IV Inj 20 MG/ML: INTRAVENOUS | Qty: 5 | Status: AC

## 2016-12-25 MED FILL — Heparin Sodium (Porcine) Inj 1000 Unit/ML: INTRAMUSCULAR | Qty: 30 | Status: AC

## 2016-12-25 MED FILL — Mannitol IV Soln 20%: INTRAVENOUS | Qty: 500 | Status: AC

## 2016-12-25 NOTE — Progress Notes (Signed)
Dr. Lucianne Lei Tight aware on evening rounds of pt back in afib RVR with rate 120's-140's. BP stable. No central access. Orders to keep amio drip at 60 ml/hr for 6 hours, then switch to 4ml/hr. Will continue to monitor.

## 2016-12-25 NOTE — NC FL2 (Signed)
[place] LEVEL OF CARE SCREENING TOOL     IDENTIFICATION  Patient Name: [name] Birthdate: 05-06-1930 Sex: female Admission Date (Current Location): 12/22/2016  [facility] and [place] Number:  Engineering geologist and Address:  [facility], [place] [address], [place], [place] [address]      Provider Number: [id number]  Attending Physician Name and Address:  Rexene Alberts, MD  Relative Name and Phone Number:       Current Level of Care: Hospital Recommended Level of Care: [place] Prior Approval Number:    Date Approved/Denied:   PASRR Number: [id number]  Discharge Plan: SNF    Current Diagnoses: Patient Active Problem List   Diagnosis Date Noted  . S/P CABG x 3 12/23/2016  . On intra-aortic balloon pump assist   . NSTEMI (non-ST elevated myocardial infarction) ([place]) 12/22/2016  . Weakness of lower extremity 12/09/2015  . Viral URI 11/07/2015  . Hypokalemia 11/07/2015  . Cough   . Diaphoresis   . Sore throat   . Non-ST elevation (NSTEMI) myocardial infarction ([place]) 12/29/2014  . Chest pain 12/28/2014  . Angina pectoris ([place])   . Coronary artery disease involving native coronary artery of native heart with unstable angina pectoris ([place])   . Emesis   . Gastroenteritis, acute   . PVC's (premature ventricular contractions) 04/25/2012  . Cardiac arrest ([place])   . Left carotid stenosis 03/01/2012  . Leg fatigue 03/01/2012  . Dysuria 03/30/2011  . Mitral regurgitation 03/30/2011  . HYPERCHOLESTEROLEMIA  IIA 08/06/2008  . Carotid bruit 05/28/2008  . Essential hypertension 05/11/2008  . Coronary atherosclerosis 05/11/2008  . Angina at rest [facility]) 05/11/2008    Orientation RESPIRATION BLADDER Height & Weight     Self, Time, Situation, Place  O2(1L [place]) Incontinent, Indwelling catheter Weight: 188 lb 7.9 oz (85.5 kg) Height:  5' 8" (172.7 cm)(measured at bedside with RN)  BEHAVIORAL SYMPTOMS/MOOD NEUROLOGICAL  BOWEL NUTRITION STATUS      Continent Diet(see DC summary)  AMBULATORY STATUS COMMUNICATION OF NEEDS Skin   Limited Assist Verbally Surgical wounds(left and right leg wounds with gauze; chest wound with silver hydrofiber)                       Personal Care Assistance Level of Assistance  Bathing, Dressing Bathing Assistance: Limited assistance   Dressing Assistance: Limited assistance     Functional Limitations Info             SPECIAL CARE FACTORS FREQUENCY  PT (By licensed PT), OT (By licensed OT)     PT Frequency: 5/wk OT Frequency: 5/wk            Contractures      Additional Factors Info  Code Status, Allergies Code Status Info: FULL Allergies Info: Biaxin Clarithromycin, Celecoxib, Ciprofloxacin, Codeine, Hydromorphone, Lisinopril, Macrobid Nitrofurantoin Monohyd Macro, Oxycodone Hcl, Prednisone, Sertraline Hcl, Penicillins           Current Medications (12/25/2016):  This is the current hospital active medication list Current Facility-Administered Medications  Medication Dose Route Frequency Provider Last Rate Last Dose  . 0.9 %  sodium chloride infusion  250 mL Intravenous Continuous Rexene Alberts, MD      . acetaminophen (TYLENOL) tablet 1,000 mg  1,000 mg Oral Q6H Rexene Alberts, MD   1,000 mg at 12/25/16 1259  . aspirin EC tablet 81 mg  81 mg Oral Daily Rexene Alberts, MD   [phone]  mg at 12/25/16 1037  . atorvastatin (LIPITOR) tablet 80 mg  80 mg Oral Daily Rexene Alberts, MD   80 mg at 12/25/16 1037  . bisacodyl (DULCOLAX) EC tablet 10 mg  10 mg Oral Daily Rexene Alberts, MD   10 mg at 12/25/16 1037   Or  . bisacodyl (DULCOLAX) suppository 10 mg  10 mg Rectal Daily Rexene Alberts, MD      . cefUROXime (ZINACEF) 1.5 g in dextrose 5 % 50 mL IVPB  1.5 g Intravenous Q12H Rexene Alberts, MD   Stopped at 12/25/16 0315  . chlorhexidine gluconate (MEDLINE KIT) (PERIDEX) 0.12 % solution 15 mL  15 mL Mouth Rinse BID Rexene Alberts, MD   15 mL at  12/25/16 0800  . Chlorhexidine Gluconate Cloth 2 % PADS 6 each  6 each Topical Daily Rexene Alberts, MD   6 each at 12/24/16 0730  . clopidogrel (PLAVIX) tablet 75 mg  75 mg Oral Daily Rexene Alberts, MD   75 mg at 12/25/16 1037  . docusate sodium (COLACE) capsule 200 mg  200 mg Oral Daily Rexene Alberts, MD   200 mg at 12/25/16 1037  . [START ON 12/26/2016] enoxaparin (LOVENOX) injection 30 mg  30 mg Subcutaneous Q24H Rexene Alberts, MD      . folic acid-pyridoxine-cyancobalamin (FOLTX) 2.5-25-2 MG per tablet 1 tablet  1 tablet Oral Daily Rexene Alberts, MD   1 tablet at 12/25/16 1037  . furosemide (LASIX) injection 20 mg  20 mg Intravenous Q6H Rexene Alberts, MD   20 mg at 12/25/16 1259  . insulin regular (NOVOLIN R,HUMULIN R) 100 Units in sodium chloride 0.9 % 100 mL (1 Units/mL) infusion   Intravenous Continuous Rexene Alberts, MD   Stopped at 12/24/16 1343  . insulin regular bolus via infusion 0-10 Units  0-10 Units Intravenous TID WC Rexene Alberts, MD      . Derrill Memo ON 12/26/2016] iron polysaccharides (NIFEREX) capsule 150 mg  150 mg Oral Daily Rexene Alberts, MD      . lactated ringers infusion   Intravenous Continuous Rexene Alberts, MD      . lactated ringers infusion   Intravenous Continuous Rexene Alberts, MD 20 mL/hr at 12/24/16 2000    . LORazepam (ATIVAN) tablet 0.5 mg  0.5 mg Oral Q8H PRN Rexene Alberts, MD      . MEDLINE mouth rinse  15 mL Mouth Rinse QID Rexene Alberts, MD   15 mL at 12/25/16 1200  . metoCLOPramide (REGLAN) injection 10 mg  10 mg Intravenous Q6H Rexene Alberts, MD   10 mg at 12/25/16 1259  . metoprolol tartrate (LOPRESSOR) injection 2.5-5 mg  2.5-5 mg Intravenous Q2H PRN Rexene Alberts, MD   5 mg at 12/25/16 1319  . metoprolol tartrate (LOPRESSOR) tablet 12.5 mg  12.5 mg Oral BID Rexene Alberts, MD   12.5 mg at 12/25/16 1037  . morphine 4 MG/ML injection 1 mg  1 mg Intravenous Q1H PRN Rexene Alberts, MD      . ondansetron [facility])  injection 4 mg  4 mg Intravenous Q6H PRN Rexene Alberts, MD   4 mg at 12/24/16 1203  . oxyCODONE (Oxy IR/ROXICODONE) immediate release tablet 5-10 mg  5-10 mg Oral Q3H PRN Rexene Alberts, MD      . sodium chloride flush (NS) 0.9 % injection 10-40 mL  10-40 mL Intracatheter Q12H Rexene Alberts,  MD   10 mL at 12/24/16 0010  . sodium chloride flush (NS) 0.9 % injection 10-40 mL  10-40 mL Intracatheter PRN Rexene Alberts, MD      . sodium chloride flush (NS) 0.9 % injection 3 mL  3 mL Intravenous Q12H Rexene Alberts, MD   3 mL at 12/25/16 1040  . sodium chloride flush (NS) 0.9 % injection 3 mL  3 mL Intravenous PRN Rexene Alberts, MD      . traMADol Veatrice Bourbon) tablet 50-100 mg  50-100 mg Oral Q4H PRN Rexene Alberts, MD         Discharge Medications: Please see discharge summary for a list of discharge medications.  Relevant Imaging Results:  Relevant Lab Results:   Additional Information [id number]  Jorge Ny, LCSW

## 2016-12-25 NOTE — Progress Notes (Addendum)
[address]       [place] [address]             [phone]        CARDIOTHORACIC SURGERY PROGRESS NOTE   R2 Days Post-Op Procedure(s) (LRB): CORONARY ARTERY BYPASS GRAFTING (CABG) TIMES THREE USING LEFT INTERNAL MAMMARY ARTERY AND LEFT SAPHENOUS LEG VEIN HARVESTED ENDOSCOPICALLY, (N/A) TRANSESOPHAGEAL ECHOCARDIOGRAM (TEE) (N/A)  Subjective: Feels anxious.  Complains that she couldn't sleep last night.  Minimal pain.  No SOB  Objective: Vital signs: BP Readings from Last 1 Encounters:  12/25/16 135/61   Pulse Readings from Last 1 Encounters:  12/25/16 92   Resp Readings from Last 1 Encounters:  12/25/16 19   Temp Readings from Last 1 Encounters:  12/25/16 98.5 F (36.9 C) (Oral)    Hemodynamics: PAP: (27)/(10) 27/10  Physical Exam:  Rhythm:   sinus  Breath sounds: clear  Heart sounds:  RRR  Incisions:  Dressings dry, intact  Abdomen:  Soft, non-distended, non-tender  Extremities:  Warm, well-perfused  Chest tubes:  low volume thin serosanguinous output, no air leak    Intake/Output from previous day: 12/13 0701 - 12/14 0700 In: 1038 [P.O.:130; I.V.:858; IV Piggyback:50] Out: 1779 [TJQZE:0923; Chest Tube:350] Intake/Output this shift: No intake/output data recorded.  Lab Results:  CBC: Recent Labs    12/24/16 1552 12/24/16 1633 12/25/16 0450  WBC 13.6*  --  11.3*  HGB 8.6* 8.2* 8.4*  HCT 25.7* 24.0* 25.7*  PLT 124*  --  112*    BMET:  Recent Labs    12/24/16 0231  12/24/16 1633 12/25/16 0300  NA 139  --  141 138  K 3.5  --  4.3 4.4  CL 111  --  105 108  CO2 19*  --   --  25  GLUCOSE 151*  --  156* 135*  BUN 11  --  11 14  CREATININE 0.83   < > 0.70 1.01*  CALCIUM 7.3*  --   --  8.3*   < > = values in this interval not displayed.     PT/INR:   Recent Labs    12/23/16 1918  LABPROT 20.2*  INR 1.74    CBG (last 3)  Recent Labs    12/24/16 2355 12/25/16 0307 12/25/16 0748  GLUCAP 126* 124* 104*     ABG    Component Value Date/Time   PHART 7.319 (L) 12/24/2016 0715   PCO2ART 40.7 12/24/2016 0715   PO2ART 127.0 (H) 12/24/2016 0715   HCO3 20.9 12/24/2016 0715   TCO2 23 12/24/2016 1633   ACIDBASEDEF 5.0 (H) 12/24/2016 0715   O2SAT 99.0 12/24/2016 0715    CXR: PORTABLE CHEST 1 VIEW  COMPARISON:  12/24/2016  FINDINGS: Cardiac shadow is stable. Postoperative changes are again seen. Intra-aortic balloon pump has been removed in the interval. Swan-Ganz catheter endotracheal tube and nasogastric catheter have also been removed in the interval. Right jugular sheath remains. Pericardial drain, mediastinal drain and left thoracostomy catheter all are stable. No pneumothorax is seen. Small right pleural effusion is again identified and stable. Minimal left basilar atelectasis is seen.  IMPRESSION: Minimal bibasilar changes.  Tubes and lines as described.   Electronically Signed   By: Inez Catalina M.D.   On: 12/25/2016 07:07  Assessment/Plan: S/P Procedure(s) (LRB): CORONARY ARTERY BYPASS GRAFTING (CABG) TIMES THREE USING LEFT INTERNAL MAMMARY ARTERY AND LEFT SAPHENOUS LEG VEIN HARVESTED ENDOSCOPICALLY, (N/A) TRANSESOPHAGEAL ECHOCARDIOGRAM (TEE) (N/A)  Doing well POD2 Maintaining  NSR w/ stable BP Breathing comfortably w/ O2 sats  96-100% on 1 L/min Expected post op acute blood loss anemia, Hgb 8.4 stable Expected post op atelectasis, very mild Expected post op volume excess, diuresing but weight still reportedly 17 lbs > preop Post op thrombocytopenia, stable Chronic anxiety and depression   Mobilize  Diuresis  D/C tubes  DAPT  Statin  Low dose beta blocker  Continue to hold preop ARB for now  PT eval to assist w/ mobility and placement  Rexene Alberts, MD 12/25/2016 8:41 AM

## 2016-12-26 ENCOUNTER — Inpatient Hospital Stay (HOSPITAL_COMMUNITY): Payer: Medicare Other

## 2016-12-26 DIAGNOSIS — R609 Edema, unspecified: Secondary | ICD-10-CM

## 2016-12-26 LAB — BPAM RBC
BLOOD PRODUCT EXPIRATION DATE: 201901012359
BLOOD PRODUCT EXPIRATION DATE: 201901012359
Blood Product Expiration Date: 201901012359
Blood Product Expiration Date: 201901012359
Blood Product Expiration Date: 201901012359
ISSUE DATE / TIME: 201812121443
ISSUE DATE / TIME: 201812121443
ISSUE DATE / TIME: 201812122202
UNIT TYPE AND RH: 5100
UNIT TYPE AND RH: 5100
Unit Type and Rh: 5100
Unit Type and Rh: 5100
Unit Type and Rh: 5100

## 2016-12-26 LAB — TYPE AND SCREEN
ABO/RH(D): O POS
Antibody Screen: NEGATIVE
UNIT DIVISION: 0
UNIT DIVISION: 0
Unit division: 0
Unit division: 0
Unit division: 0

## 2016-12-26 LAB — CBC
HEMATOCRIT: 26 % — AB (ref 36.0–46.0)
HEMOGLOBIN: 8.7 g/dL — AB (ref 12.0–15.0)
MCH: 29.8 pg (ref 26.0–34.0)
MCHC: 33.5 g/dL (ref 30.0–36.0)
MCV: 89 fL (ref 78.0–100.0)
Platelets: 125 10*3/uL — ABNORMAL LOW (ref 150–400)
RBC: 2.92 MIL/uL — ABNORMAL LOW (ref 3.87–5.11)
RDW: 14.8 % (ref 11.5–15.5)
WBC: 12 10*3/uL — AB (ref 4.0–10.5)

## 2016-12-26 LAB — BASIC METABOLIC PANEL
ANION GAP: 7 (ref 5–15)
BUN: 18 mg/dL (ref 6–20)
CALCIUM: 8.1 mg/dL — AB (ref 8.9–10.3)
CO2: 25 mmol/L (ref 22–32)
CREATININE: 0.81 mg/dL (ref 0.44–1.00)
Chloride: 104 mmol/L (ref 101–111)
Glucose, Bld: 126 mg/dL — ABNORMAL HIGH (ref 65–99)
Potassium: 3.5 mmol/L (ref 3.5–5.1)
Sodium: 136 mmol/L (ref 135–145)

## 2016-12-26 MED ORDER — AMIODARONE HCL IN DEXTROSE 360-4.14 MG/200ML-% IV SOLN
30.0000 mg/h | INTRAVENOUS | Status: DC
  Filled 2016-12-26: qty 200

## 2016-12-26 MED ORDER — CEPHALEXIN 500 MG PO CAPS
500.0000 mg | ORAL_CAPSULE | Freq: Three times a day (TID) | ORAL | Status: DC
  Administered 2016-12-26 – 2017-01-02 (×20): 500 mg via ORAL
  Filled 2016-12-26 (×22): qty 1

## 2016-12-26 MED ORDER — AMIODARONE HCL 200 MG PO TABS
200.0000 mg | ORAL_TABLET | Freq: Two times a day (BID) | ORAL | Status: DC
  Administered 2016-12-26 – 2016-12-27 (×3): 200 mg via ORAL
  Filled 2016-12-26 (×4): qty 1

## 2016-12-26 MED ORDER — POTASSIUM CHLORIDE CRYS ER 20 MEQ PO TBCR
20.0000 meq | EXTENDED_RELEASE_TABLET | ORAL | Status: DC | PRN
  Administered 2016-12-26 – 2016-12-28 (×2): 20 meq via ORAL
  Filled 2016-12-26 (×2): qty 1

## 2016-12-26 NOTE — Evaluation (Signed)
Physical Therapy Evaluation Patient Details Name: [name] MRN: [id number] DOB: 10/13/1930 Today's Date: 12/26/2016   History of Present Illness  83 y.o. female with hx of CAD, CA, HTN, Gilbert's syndrome, TIA , and macular degeneration.  Clinical Impression  Patient is s/p above procedure presenting with functional limitations due to the deficits listed below (see PT Problem List). Demonstrates ability to transfer out of bed to [facility] with min assist, up to mod assist +2 for safe transfer back into bed while maintaining sternal precautions. Pt with watery bowel incontinence. She was independent prior to admission and apparently lives with her son at home. May be a good candidate for CIR pending her progress from her on out. Patient will benefit from skilled PT to increase their independence and safety with mobility to allow discharge to the venue listed below.       Follow Up Recommendations CIR (Pending progress)    Equipment Recommendations  (TBD)    Recommendations for Other Services Rehab consult;OT consult     Precautions / Restrictions Precautions Precautions: Sternal;Fall;Other (comment)(Pacer wires) Precaution Comments: Educated on sternal precautions. Restrictions Weight Bearing Restrictions: No Other Position/Activity Restrictions: Sternal precautions      Mobility  Bed Mobility Overal bed mobility: Needs Assistance Bed Mobility: Rolling;Sidelying to Sit;Sit to Sidelying Rolling: Min assist Sidelying to sit: Min assist     Sit to sidelying: Mod assist;+2 for physical assistance General bed mobility comments: Min assist with cues for safety and sternal precautions while rolling to pts Rt side. Min assist for truncal support to rise to EOB. Mod assist +2 for return to bed with LE support and truncal support for safe transition without using chest musculature.  Transfers Overall transfer level: Needs assistance Equipment used: 1 person hand held assist Transfers:  Sit to/from [facility] Sit to Stand: Min assist Stand pivot transfers: Min assist;+2 safety/equipment       General transfer comment: Education for safe transfer methods due to sternal precautions. Good LE strength for boost from bed with min assist for balance and guidance. Able to take small pivotal steps to [facility] and back with min assist for support. No overt buckling or reported dizziness.  Ambulation/Gait             General Gait Details: Declines, too fatigued and having bowel incontinence.  Stairs            Wheelchair Mobility    Modified Rankin (Stroke Patients Only)       Balance Overall balance assessment: Needs assistance Sitting-balance support: No upper extremity supported;Feet supported Sitting balance-Leahy Scale: Fair     Standing balance support: No upper extremity supported Standing balance-Leahy Scale: Fair Standing balance comment: Tolerated standing briefly without UE support.                             Pertinent Vitals/Pain Pain Assessment: Faces Faces Pain Scale: Hurts even more Pain Descriptors / Indicators: Grimacing;Guarding;Operative site guarding Pain Intervention(s): Monitored during session;Repositioned;Limited activity within patient's tolerance    Home Living Family/patient expects to be discharged to:: Skilled nursing facility Living Arrangements: Children Available Help at Discharge: Family Type of Home: House Home Access: Stairs to enter Entrance Stairs-Rails: Psychiatric nurse of Steps: 4 Home Layout: Two level;Able to live on main level with bedroom/bathroom Home Equipment: Shower seat      Prior Function Level of Independence: Independent  Hand Dominance        Extremity/Trunk Assessment   Upper Extremity Assessment Upper Extremity Assessment: Defer to OT evaluation    Lower Extremity Assessment Lower Extremity Assessment: Generalized  weakness       Communication   Communication: No difficulties  Cognition Arousal/Alertness: Awake/alert Behavior During Therapy: WFL for tasks assessed/performed Overall Cognitive Status: Within Functional Limits for tasks assessed                                        General Comments General comments (skin integrity, edema, etc.): Pt with bowel incontinence. Hygiene performed and sat on [facility] for a moment with urine void only.    Exercises     Assessment/Plan    PT Assessment Patient needs continued PT services  PT Problem List Decreased strength;Decreased activity tolerance;Decreased balance;Decreased mobility;Decreased knowledge of use of DME;Decreased knowledge of precautions;Cardiopulmonary status limiting activity;Pain       PT Treatment Interventions DME instruction;Gait training;Functional mobility training;Therapeutic activities;Therapeutic exercise;Balance training;Patient/family education    PT Goals (Current goals can be found in the Care Plan section)  Acute Rehab PT Goals Patient Stated Goal: Get well PT Goal Formulation: With patient Time For Goal Achievement: 01/09/17 Potential to Achieve Goals: Good    Frequency Min 3X/week   Barriers to discharge        Co-evaluation               AM-PAC PT "6 Clicks" Daily Activity  Outcome Measure Difficulty turning over in bed (including adjusting bedclothes, sheets and blankets)?: A Lot Difficulty moving from lying on back to sitting on the side of the bed? : A Lot Difficulty sitting down on and standing up from a chair with arms (e.g., wheelchair, bedside commode, etc,.)?: A Little Help needed moving to and from a bed to chair (including a wheelchair)?: A Little Help needed walking in hospital room?: A Lot Help needed climbing 3-5 steps with a railing? : A Lot 6 Click Score: 14    End of Session Equipment Utilized During Treatment: Gait belt;Oxygen Activity Tolerance: Patient limited  by fatigue Patient left: in bed;with call bell/phone within reach Nurse Communication: Mobility status;Precautions;Other (comment)(dressing with stool on it Lt groin) PT Visit Diagnosis: Muscle weakness (generalized) (M62.81);Difficulty in walking, not elsewhere classified (R26.2);Pain    Time: 5732-2025 PT Time Calculation (min) (ACUTE ONLY): 24 min   Charges:   PT Evaluation $PT Eval High Complexity: 1 High PT Treatments $Therapeutic Activity: 8-22 mins   PT G Codes:        Elayne Snare, [place] [phone]  Ellouise Newer 12/26/2016, 2:14 PM

## 2016-12-26 NOTE — Progress Notes (Signed)
Patient had sustain hr of 155 and above. 5 mg of IV metoprolol given, patient appears stable.  Currently on Amiodarone drip.  Rate return to 90s to 100 after 6 mins of administration of beta blocker. Will continue to monitor. Also noted on morning assessment is swollen right arm. D/C  IV that had amiodarone infusing and restarted a 22g on right thumb. Pt has had repeated lose stool will hold laxative for today and reevaluate later.

## 2016-12-26 NOTE — Progress Notes (Signed)
VASCULAR LAB PRELIMINARY  PRELIMINARY  PRELIMINARY  PRELIMINARY  Right upper extremity venous duplex completed.    Preliminary report:  There is no DVT noted in the right upper extremity.  There are short segments of superficial thrombosis noted in the the right cephalic and basilic at the [facility]  There is significant interstitial fluid noted throughout the right upper extremity.   [name], RVT 12/26/2016, 6:56 PM

## 2016-12-27 MED ORDER — FUROSEMIDE 40 MG PO TABS
40.0000 mg | ORAL_TABLET | Freq: Every day | ORAL | Status: DC
  Administered 2016-12-27 – 2016-12-29 (×3): 40 mg via ORAL
  Filled 2016-12-27 (×3): qty 1

## 2016-12-27 MED ORDER — AMIODARONE HCL 200 MG PO TABS
400.0000 mg | ORAL_TABLET | Freq: Two times a day (BID) | ORAL | Status: DC
  Administered 2016-12-27 – 2016-12-31 (×8): 400 mg via ORAL
  Filled 2016-12-27 (×9): qty 2

## 2016-12-27 MED ORDER — METOPROLOL TARTRATE 25 MG PO TABS
25.0000 mg | ORAL_TABLET | Freq: Two times a day (BID) | ORAL | Status: DC
  Administered 2016-12-27: 25 mg via ORAL
  Filled 2016-12-27: qty 1

## 2016-12-27 MED ORDER — AMIODARONE HCL 200 MG PO TABS: 400.0000 mg | ORAL_TABLET | Freq: Two times a day (BID) | ORAL | Status: DC

## 2016-12-27 MED ORDER — POTASSIUM CHLORIDE CRYS ER 20 MEQ PO TBCR
20.0000 meq | EXTENDED_RELEASE_TABLET | Freq: Every day | ORAL | Status: DC
  Administered 2016-12-27 – 2016-12-28 (×2): 20 meq via ORAL
  Filled 2016-12-27 (×3): qty 1

## 2016-12-27 NOTE — Progress Notes (Signed)
Rehab Admissions Coordinator Note:  Patient was screened by Retta Diones for appropriateness for an Inpatient Acute Rehab Consult.  At this time, we are recommending Inpatient Rehab consult.  Retta Diones 12/27/2016, 11:01 AM  I can be reached at [phone].

## 2016-12-27 NOTE — Progress Notes (Signed)
4 Days Post-Op Procedure(s) (LRB): CORONARY ARTERY BYPASS GRAFTING (CABG) TIMES THREE USING LEFT INTERNAL MAMMARY ARTERY AND LEFT SAPHENOUS LEG VEIN HARVESTED ENDOSCOPICALLY, (N/A) TRANSESOPHAGEAL ECHOCARDIOGRAM (TEE) (N/A) Subjective: Walked in hall this am R arm swelling better nsr Objective: Vital signs in last 24 hours: Temp:  [98.1 F (36.7 C)-99 F (37.2 C)] 98.5 F (36.9 C) (12/16 0821) Pulse Rate:  [81-107] 92 (12/16 0700) Cardiac Rhythm: Normal sinus rhythm;Atrial fibrillation (12/16 0900) Resp:  [13-27] 24 (12/16 0900) BP: (98-177)/(50-88) 140/77 (12/16 0900) SpO2:  [99 %-100 %] 100 % (12/16 0900) Weight:  [185 lb 13.6 oz (84.3 kg)] 185 lb 13.6 oz (84.3 kg) (12/16 0500)  Hemodynamic parameters for last 24 hours:    Intake/Output from previous day: 12/15 0701 - 12/16 0700 In: 546.9 [P.O.:290; I.V.:256.9] Out: 475 [Urine:475] Intake/Output this shift: Total I/O In: 120 [P.O.:120] Out: -        Exam    General- alert and comfortable   Lungs- clear without rales, wheezes   Cor- regular rate and rhythm, no murmur , gallop   Abdomen- soft, non-tender   Extremities - warm, non-tender, minimal edema   Neuro- oriented, appropriate, no focal weakness   Lab Results: Recent Labs    12/25/16 0450 12/26/16 0333  WBC 11.3* 12.0*  HGB 8.4* 8.7*  HCT 25.7* 26.0*  PLT 112* 125*   BMET:  Recent Labs    12/25/16 0300 12/26/16 0333  NA 138 136  K 4.4 3.5  CL 108 104  CO2 25 25  GLUCOSE 135* 126*  BUN 14 18  CREATININE 1.01* 0.81  CALCIUM 8.3* 8.1*    PT/INR: No results for input(s): LABPROT, INR in the last 72 hours. ABG    Component Value Date/Time   PHART 7.319 (L) 12/24/2016 0715   HCO3 20.9 12/24/2016 0715   TCO2 23 12/24/2016 1633   ACIDBASEDEF 5.0 (H) 12/24/2016 0715   O2SAT 99.0 12/24/2016 0715   CBG (last 3)  Recent Labs    12/25/16 0748 12/25/16 1212 12/25/16 1603  GLUCAP 104* 112* 123*    Assessment/Plan: S/P Procedure(s)  (LRB): CORONARY ARTERY BYPASS GRAFTING (CABG) TIMES THREE USING LEFT INTERNAL MAMMARY ARTERY AND LEFT SAPHENOUS LEG VEIN HARVESTED ENDOSCOPICALLY, (N/A) TRANSESOPHAGEAL ECHOCARDIOGRAM (TEE) (N/A) Mobilize Diuresis too weak for stepdown but slowly progressing   LOS: 5 days    Tharon Aquas Trigt III 12/27/2016

## 2016-12-28 LAB — CBC
HCT: 26.8 % — ABNORMAL LOW (ref 36.0–46.0)
Hemoglobin: 8.7 g/dL — ABNORMAL LOW (ref 12.0–15.0)
MCH: 29 pg (ref 26.0–34.0)
MCHC: 32.5 g/dL (ref 30.0–36.0)
MCV: 89.3 fL (ref 78.0–100.0)
Platelets: 192 10*3/uL (ref 150–400)
RBC: 3 MIL/uL — ABNORMAL LOW (ref 3.87–5.11)
RDW: 14.5 % (ref 11.5–15.5)
WBC: 8.5 10*3/uL (ref 4.0–10.5)

## 2016-12-28 LAB — BASIC METABOLIC PANEL
Anion gap: 8 (ref 5–15)
BUN: 13 mg/dL (ref 6–20)
CO2: 30 mmol/L (ref 22–32)
Calcium: 8.3 mg/dL — ABNORMAL LOW (ref 8.9–10.3)
Chloride: 100 mmol/L — ABNORMAL LOW (ref 101–111)
Creatinine, Ser: 0.82 mg/dL (ref 0.44–1.00)
GFR calc Af Amer: >60 mL/min (ref >60)
GFR calc non Af Amer: >60 mL/min (ref >60)
Glucose, Bld: 105 mg/dL — ABNORMAL HIGH (ref 65–99)
Potassium: 3.6 mmol/L (ref 3.5–5.1)
Sodium: 138 mmol/L (ref 135–145)

## 2016-12-28 LAB — GLUCOSE, CAPILLARY
GLUCOSE-CAPILLARY: 91 mg/dL (ref 65–99)
GLUCOSE-CAPILLARY: 138 mg/dL — AB (ref 65–99)

## 2016-12-28 MED ORDER — PANTOPRAZOLE SODIUM 40 MG PO TBEC
40.0000 mg | DELAYED_RELEASE_TABLET | Freq: Every day | ORAL | Status: DC
  Administered 2016-12-28 – 2017-01-02 (×6): 40 mg via ORAL
  Filled 2016-12-28 (×6): qty 1

## 2016-12-28 MED ORDER — AMIODARONE IV BOLUS ONLY 150 MG/100ML
150.0000 mg | Freq: Once | INTRAVENOUS | Status: AC
  Administered 2016-12-28: 150 mg via INTRAVENOUS
  Filled 2016-12-28: qty 100

## 2016-12-28 MED ORDER — LOSARTAN POTASSIUM 50 MG PO TABS
100.0000 mg | ORAL_TABLET | Freq: Every day | ORAL | Status: DC
Start: 2016-12-28 — End: 2017-01-02
  Administered 2016-12-28 – 2017-01-02 (×6): 100 mg via ORAL
  Filled 2016-12-28 (×6): qty 2

## 2016-12-28 MED ORDER — INSULIN ASPART 100 UNIT/ML ~~LOC~~ SOLN
0.0000 [IU] | SUBCUTANEOUS | Status: DC
  Administered 2016-12-28 – 2016-12-29 (×3): 2 [IU] via SUBCUTANEOUS

## 2016-12-28 MED ORDER — MOVING RIGHT ALONG BOOK
Freq: Once | Status: AC
  Administered 2016-12-28: 15:00:00
  Filled 2016-12-28: qty 1

## 2016-12-28 MED ORDER — SODIUM CHLORIDE 0.9% FLUSH
3.0000 mL | INTRAVENOUS | Status: DC | PRN
  Administered 2016-12-31: 3 mL via INTRAVENOUS
  Filled 2016-12-28: qty 3

## 2016-12-28 MED ORDER — METOPROLOL TARTRATE 50 MG PO TABS
50.0000 mg | ORAL_TABLET | Freq: Two times a day (BID) | ORAL | Status: DC
  Administered 2016-12-28 – 2017-01-02 (×11): 50 mg via ORAL
  Filled 2016-12-28 (×11): qty 1

## 2016-12-28 MED ORDER — SODIUM CHLORIDE 0.9 % IV SOLN: 250.0000 mL | INTRAVENOUS | Status: DC | PRN

## 2016-12-28 MED ORDER — SODIUM CHLORIDE 0.9% FLUSH
3.0000 mL | Freq: Two times a day (BID) | INTRAVENOUS | Status: DC
  Administered 2016-12-28 – 2017-01-02 (×5): 3 mL via INTRAVENOUS

## 2016-12-28 NOTE — Progress Notes (Signed)
Physical Therapy Treatment Patient Details Name: [name] MRN: [id number] DOB: Mar 27, 1930 Today's Date: 12/28/2016    History of Present Illness 83 y.o. female with hx of CAD, CA, HTN, Gilbert's syndrome, TIA , and macular degeneration.  CABG x 3 on 12/12.      PT Comments    Pt admitted with above diagnosis. Pt currently with functional limitations due to the deficits listed below (see PT Problem List). Pt was able to ambulate with Harmon Pier walker and min assist up to 295 feet.  Progressing well.   Pt will benefit from skilled PT to increase their independence and safety with mobility to allow discharge to the venue listed below.     Follow Up Recommendations  CIR(Pending progress)     Equipment Recommendations  (TBD)    Recommendations for Other Services Rehab consult;OT consult     Precautions / Restrictions Precautions Precautions: Sternal Precaution Comments: Educated on sternal precautions. Restrictions Weight Bearing Restrictions: Yes(Sternal Precautions) Other Position/Activity Restrictions: Sternal precautions    Mobility  Bed Mobility Overal bed mobility: Needs Assistance Bed Mobility: Rolling;Sidelying to Sit;Sit to Sidelying Rolling: Min assist Sidelying to sit: Min assist       General bed mobility comments: Min assist with cues for safety and sternal precautions while rolling to pts Rt side. Min assist for truncal support to rise to EOB.  Transfers Overall transfer level: Needs assistance Equipment used: Harmon Pier walker) Transfers: Sit to/from [employer] to Stand: Min assist         General transfer comment: Education for safe transfer methods due to sternal precautions with pt able to use UEs on her LEs to push up. Good LE strength for boost from bed with min assist for balance and guidance. No overt buckling or reported dizziness.  Ambulation/Gait Ambulation/Gait assistance: Min guard Ambulation Distance (Feet): 295  Feet Assistive device: (Eva walker) Gait Pattern/deviations: Step-through pattern;Decreased stride length   Gait velocity interpretation: Below normal speed for age/gender General Gait Details: Pt used Eva walker well without need for steering assist.   Pt needed occasional cues to stand tall.  VSS throughout.    Stairs            Wheelchair Mobility    Modified Rankin (Stroke Patients Only)       Balance Overall balance assessment: Needs assistance Sitting-balance support: No upper extremity supported;Feet supported Sitting balance-Leahy Scale: Fair     Standing balance support: Bilateral upper extremity supported;During functional activity Standing balance-Leahy Scale: Poor Standing balance comment: Needs light bil UE support for balance                            Cognition Arousal/Alertness: Awake/alert Behavior During Therapy: WFL for tasks assessed/performed Overall Cognitive Status: Within Functional Limits for tasks assessed                                        Exercises General Exercises - Lower Extremity Long Arc Quad: AROM;Both;10 reps;Seated    General Comments General comments (skin integrity, edema, etc.): Pt was on 4LO2 on arrival with sats at 100%.  Pt was able to ambulate on 2L with sats 90-94% therefore incr to 4L with O2 95-100%.  Left pt on 4L O2.        Pertinent Vitals/Pain Pain Assessment: Faces Faces Pain Scale: Hurts little more Pain Location:  chest incision Pain Descriptors / Indicators: Grimacing;Guarding;Operative site guarding Pain Intervention(s): Limited activity within patient's tolerance;Monitored during session;Premedicated before session;Repositioned    Home Living                      Prior Function            PT Goals (current goals can now be found in the care plan section) Acute Rehab PT Goals Patient Stated Goal: Get well Progress towards PT goals: Progressing toward goals     Frequency    Min 3X/week      PT Plan Current plan remains appropriate    Co-evaluation              AM-PAC PT "6 Clicks" Daily Activity  Outcome Measure  Difficulty turning over in bed (including adjusting bedclothes, sheets and blankets)?: A Lot Difficulty moving from lying on back to sitting on the side of the bed? : A Lot Difficulty sitting down on and standing up from a chair with arms (e.g., wheelchair, bedside commode, etc,.)?: A Little Help needed moving to and from a bed to chair (including a wheelchair)?: A Little Help needed walking in hospital room?: A Little Help needed climbing 3-5 steps with a railing? : A Lot 6 Click Score: 15    End of Session Equipment Utilized During Treatment: Gait belt;Oxygen Activity Tolerance: Patient limited by fatigue Patient left: with call bell/phone within reach;in chair;with chair alarm set;with family/visitor present Nurse Communication: Mobility status;Precautions PT Visit Diagnosis: Muscle weakness (generalized) (M62.81);Difficulty in walking, not elsewhere classified (R26.2);Pain Pain - part of body: (sternal)     Time: 4481-8563 PT Time Calculation (min) (ACUTE ONLY): 24 min  Charges:  $Gait Training: 23-37 mins                    G Codes:       [place] [phone] (pager)    Denice Paradise 12/28/2016, 1:42 PM

## 2016-12-28 NOTE — Progress Notes (Signed)
Came to ambulate pt however pts RN wanted to work on getting BP down and pt still getting settled in bed. She has walked x2 today. Encouraged third walk this pm, will f/u tomorrow. Yves Dill CES, ACSM 2:56 PM 12/28/2016

## 2016-12-28 NOTE — Progress Notes (Signed)
Pt back in NSR

## 2016-12-28 NOTE — Progress Notes (Addendum)
[facility] DAILY ICU PROGRESS NOTE                   [address]            [place] [address]          [phone]   5 Days Post-Op Procedure(s) (LRB): CORONARY ARTERY BYPASS GRAFTING (CABG) TIMES THREE USING LEFT INTERNAL MAMMARY ARTERY AND LEFT SAPHENOUS LEG VEIN HARVESTED ENDOSCOPICALLY, (N/A) TRANSESOPHAGEAL ECHOCARDIOGRAM (TEE) (N/A)  Total Length of Stay:  LOS: 6 days   Subjective: Feels good this morning. She is eating her breakfast.   Objective: Vital signs in last 24 hours: Temp:  [97.8 F (36.6 C)-99.1 F (37.3 C)] 98.1 F (36.7 C) (12/17 0736) Pulse Rate:  [77-105] 84 (12/17 0703) Cardiac Rhythm: Normal sinus rhythm (12/17 0400) Resp:  [12-42] 16 (12/17 0703) BP: (107-178)/(47-86) 178/77 (12/17 0736) SpO2:  [93 %-100 %] 100 % (12/17 0703) Weight:  [185 lb 13.6 oz (84.3 kg)] 185 lb 13.6 oz (84.3 kg) (12/17 0600)  Filed Weights   12/26/16 0630 12/27/16 0500 12/28/16 0600  Weight: 187 lb 2.7 oz (84.9 kg) 185 lb 13.6 oz (84.3 kg) 185 lb 13.6 oz (84.3 kg)    Weight change: 0 lb (0 kg)      Intake/Output from previous day: 12/16 0701 - 12/17 0700 In: 3570 [P.O.:1080; I.V.:10] Out: 1779 [Urine:1650]  Intake/Output this shift: No intake/output data recorded.  Current Meds: Scheduled Meds: . acetaminophen  1,000 mg Oral Q6H  . amiodarone  400 mg Oral BID  . aspirin EC  81 mg Oral Daily  . atorvastatin  80 mg Oral Daily  . bisacodyl  10 mg Oral Daily   Or  . bisacodyl  10 mg Rectal Daily  . cephALEXin  500 mg Oral Q8H  . clopidogrel  75 mg Oral Daily  . docusate sodium  200 mg Oral Daily  . enoxaparin (LOVENOX) injection  30 mg Subcutaneous Q24H  . folic acid-pyridoxine-cyancobalamin  1 tablet Oral Daily  . furosemide  40 mg Oral Daily  . iron polysaccharides  150 mg Oral Daily  . metoCLOPramide (REGLAN) injection  10 mg Intravenous Q6H  . metoprolol tartrate  25 mg Oral BID  . potassium chloride  20 mEq Oral Daily  . sodium chloride  flush  3 mL Intravenous Q12H   Continuous Infusions: . sodium chloride    . lactated ringers    . lactated ringers 20 mL/hr at 12/26/16 1400   PRN Meds:.LORazepam, metoprolol tartrate, ondansetron (ZOFRAN) IV, potassium chloride, sodium chloride flush, sodium chloride flush, traMADol  General appearance: alert, cooperative and no distress Heart: regular rate and rhythm, S1, S2 normal, no murmur, click, rub or gallop Lungs: clear to auscultation bilaterally Abdomen: soft, non-tender; bowel sounds normal; no masses,  no organomegaly Extremities: 1-2+ nonpitting edema in upper and lower extremity Wound: clean and dry  Lab Results: CBC: Recent Labs    12/26/16 0333 12/28/16 0240  WBC 12.0* 8.5  HGB 8.7* 8.7*  HCT 26.0* 26.8*  PLT 125* 192   BMET:  Recent Labs    12/26/16 0333 12/28/16 0240  NA 136 138  K 3.5 3.6  CL 104 100*  CO2 25 30  GLUCOSE 126* 105*  BUN 18 13  CREATININE 0.81 0.82  CALCIUM 8.1* 8.3*    CMET: Lab Results  Component Value Date   WBC 8.5 12/28/2016   HGB 8.7 (L) 12/28/2016   HCT 26.8 (L) 12/28/2016   PLT 192  12/28/2016   GLUCOSE 105 (H) 12/28/2016   CHOL 121 12/22/2016   TRIG 129 12/22/2016   HDL 48 12/22/2016   LDLCALC 47 12/22/2016   ALT 15 12/23/2016   AST 23 12/23/2016   NA 138 12/28/2016   K 3.6 12/28/2016   CL 100 (L) 12/28/2016   CREATININE 0.82 12/28/2016   BUN 13 12/28/2016   CO2 30 12/28/2016   TSH 1.472 11/07/2015   INR 1.74 12/23/2016   HGBA1C 5.6 12/23/2016      PT/INR: No results for input(s): LABPROT, INR in the last 72 hours. Radiology: No results found.   Assessment/Plan: S/P Procedure(s) (LRB): CORONARY ARTERY BYPASS GRAFTING (CABG) TIMES THREE USING LEFT INTERNAL MAMMARY ARTERY AND LEFT SAPHENOUS LEG VEIN HARVESTED ENDOSCOPICALLY, (N/A) TRANSESOPHAGEAL ECHOCARDIOGRAM (TEE) (N/A)  1. CV- some postop atrial fibrillation over weekend, now in NSR in the 80s-90s. BP elevated this morning. Restart Losartan at  her home dose and increase Lopresor to 50mg  BID. Continue DAPT and atorvastatin.  2. Pulm-remains on 3L [place] for oxygen support. Encourage incentive spirometer use.  3. Renal-creatinine 0.82, electrolytes okay.  Good urine output. Remains about 15lbs fluid overloaded. On Lasix 40mg  daily.  4. H and H stable, expected acute blood loss anemia. Platelets trending up. 5. Endo-blood glucose level well controlled on current regimen.  6. Continue Plavix  75mg  and Lovenox injections.   Plan: okay for transfer to the telemetry floor as soon as a bed is available.    Elgie Collard 12/28/2016 8:09 AM   I have seen and examined the patient and agree with the assessment and plan as outlined.  Making slow but steady progress.  Rexene Alberts, MD 12/28/2016 8:55 AM

## 2016-12-28 NOTE — Progress Notes (Addendum)
0939 after giving PO am meds, pt went back into rapid afib rates up to 130-140. Roxy Manns MD made aware that I had already given the 400 of amio PO and her daily metoprolol. MD verbal order to give amio bolus now, and hold off on pulling the epicardial wires and see if she converts back to NSR. Will administer and monitor closely.  Lucius Conn, RN

## 2016-12-28 NOTE — Progress Notes (Signed)
Patient arrived to the room from 2 heart. Patient VS are  B/P 166/63 Heart rate 82 and Resp 23. Patient is alert and oriented. Patient denies any pain. Patient Left Arm is bruised and swollen. Midsternal Incision is clean dry and intact.

## 2016-12-29 ENCOUNTER — Inpatient Hospital Stay (HOSPITAL_COMMUNITY): Payer: Medicare Other

## 2016-12-29 LAB — GLUCOSE, CAPILLARY
GLUCOSE-CAPILLARY: 104 mg/dL — AB (ref 65–99)
GLUCOSE-CAPILLARY: 130 mg/dL — AB (ref 65–99)
GLUCOSE-CAPILLARY: 108 mg/dL — AB (ref 65–99)
GLUCOSE-CAPILLARY: 121 mg/dL — AB (ref 65–99)
Glucose-Capillary: 112 mg/dL — ABNORMAL HIGH (ref 65–99)
Glucose-Capillary: 118 mg/dL — ABNORMAL HIGH (ref 65–99)

## 2016-12-29 LAB — BASIC METABOLIC PANEL
Anion gap: 8 (ref 5–15)
BUN: 18 mg/dL (ref 6–20)
CALCIUM: 8.5 mg/dL — AB (ref 8.9–10.3)
CO2: 30 mmol/L (ref 22–32)
CREATININE: 0.8 mg/dL (ref 0.44–1.00)
Chloride: 100 mmol/L — ABNORMAL LOW (ref 101–111)
GFR calc non Af Amer: >60 mL/min (ref >60)
Glucose, Bld: 109 mg/dL — ABNORMAL HIGH (ref 65–99)
Potassium: 3.2 mmol/L — ABNORMAL LOW (ref 3.5–5.1)
SODIUM: 138 mmol/L (ref 135–145)

## 2016-12-29 LAB — CBC
HEMATOCRIT: 25.6 % — AB (ref 36.0–46.0)
Hemoglobin: 8.2 g/dL — ABNORMAL LOW (ref 12.0–15.0)
MCH: 28.8 pg (ref 26.0–34.0)
MCHC: 32 g/dL (ref 30.0–36.0)
MCV: 89.8 fL (ref 78.0–100.0)
Platelets: 218 10*3/uL (ref 150–400)
RBC: 2.85 MIL/uL — ABNORMAL LOW (ref 3.87–5.11)
RDW: 14.5 % (ref 11.5–15.5)
WBC: 6.7 10*3/uL (ref 4.0–10.5)

## 2016-12-29 MED ORDER — POTASSIUM CHLORIDE 20 MEQ PO PACK
40.0000 meq | PACK | Freq: Once | ORAL | Status: AC
  Administered 2016-12-29: 40 meq via ORAL
  Filled 2016-12-29 (×2): qty 2

## 2016-12-29 MED ORDER — LOPERAMIDE HCL 2 MG PO CAPS
2.0000 mg | ORAL_CAPSULE | ORAL | Status: DC | PRN
  Administered 2016-12-29: 2 mg via ORAL
  Filled 2016-12-29: qty 1

## 2016-12-29 MED ORDER — FUROSEMIDE 10 MG/ML IJ SOLN
40.0000 mg | Freq: Two times a day (BID) | INTRAMUSCULAR | Status: AC
  Administered 2016-12-29 – 2016-12-30 (×2): 40 mg via INTRAVENOUS
  Filled 2016-12-29 (×2): qty 4

## 2016-12-29 MED ORDER — POTASSIUM CHLORIDE CRYS ER 20 MEQ PO TBCR
40.0000 meq | EXTENDED_RELEASE_TABLET | Freq: Once | ORAL | Status: AC
  Administered 2016-12-29: 40 meq via ORAL

## 2016-12-29 MED ORDER — POTASSIUM CHLORIDE CRYS ER 20 MEQ PO TBCR: 20.0000 meq | EXTENDED_RELEASE_TABLET | Freq: Once | ORAL | Status: DC

## 2016-12-29 NOTE — Progress Notes (Addendum)
[address]       [place] [address]             [phone]      6 Days Post-Op Procedure(s) (LRB): CORONARY ARTERY BYPASS GRAFTING (CABG) TIMES THREE USING LEFT INTERNAL MAMMARY ARTERY AND LEFT SAPHENOUS LEG VEIN HARVESTED ENDOSCOPICALLY, (N/A) TRANSESOPHAGEAL ECHOCARDIOGRAM (TEE) (N/A) Subjective: Having lots of diarrhea. This has been a chronic issue for her.   Objective: Vital signs in last 24 hours: Temp:  [97.7 F (36.5 C)-98.4 F (36.9 C)] 98.4 F (36.9 C) (12/18 0422) Pulse Rate:  [68-145] 92 (12/18 0651) Cardiac Rhythm: Normal sinus rhythm (12/18 0700) Resp:  [14-44] 14 (12/18 0651) BP: (123-187)/(51-83) 128/53 (12/18 0600) SpO2:  [63 %-100 %] 96 % (12/18 0651) Weight:  [181 lb 11.2 oz (82.4 kg)] 181 lb 11.2 oz (82.4 kg) (12/18 0650)     Intake/Output from previous day: 12/17 0701 - 12/18 0700 In: 785.6 [P.O.:510; I.V.:275.6] Out: 1150 [Urine:1150] Intake/Output this shift: No intake/output data recorded.  General appearance: alert, cooperative and no distress Heart: regular rate and rhythm, S1, S2 normal, no murmur, click, rub or gallop Lungs: clear to auscultation bilaterally Abdomen: soft, non-tender; bowel sounds normal; no masses,  no organomegaly Extremities: upper extremity edema. left hand and forearm with extensive hematoma and ecchymosis. Wound: sternal wound clean and dry  Lab Results: Recent Labs    12/28/16 0240 12/29/16 0350  WBC 8.5 6.7  HGB 8.7* 8.2*  HCT 26.8* 25.6*  PLT 192 218   BMET:  Recent Labs    12/28/16 0240 12/29/16 0350  NA 138 138  K 3.6 3.2*  CL 100* 100*  CO2 30 30  GLUCOSE 105* 109*  BUN 13 18  CREATININE 0.82 0.80  CALCIUM 8.3* 8.5*    PT/INR: No results for input(s): LABPROT, INR in the last 72 hours. ABG    Component Value Date/Time   PHART 7.319 (L) 12/24/2016 0715   HCO3 20.9 12/24/2016 0715   TCO2 23 12/24/2016 1633   ACIDBASEDEF 5.0 (H) 12/24/2016 0715   O2SAT 99.0  12/24/2016 0715   CBG (last 3)  Recent Labs    12/28/16 1925 12/29/16 0008 12/29/16 0403  GLUCAP 138* 118* 104*    Assessment/Plan: S/P Procedure(s) (LRB): CORONARY ARTERY BYPASS GRAFTING (CABG) TIMES THREE USING LEFT INTERNAL MAMMARY ARTERY AND LEFT SAPHENOUS LEG VEIN HARVESTED ENDOSCOPICALLY, (N/A) TRANSESOPHAGEAL ECHOCARDIOGRAM (TEE) (N/A)  1. CV- some postop atrial fibrillation over weekend and yesterday, now in NSR in the 80s-90s. BP better controlled on Losartan at her home dose and Lopressor to 50mg  BID.  Continue DAPT and atorvastatin.  2. Pulm-tolerating room air with excellent oxygenation. Encourage incentive spirometer use. CXR this morning is stable with probable small bilateral pleural effusions.  3. Renal-creatinine 0.80, hypokalemia will order an extra 20MEQ of k-dur.  Good urine output. Remains about 10lbs fluid overloaded. On Lasix 40mg  daily.  4. H and H stable, expected acute blood loss anemia. Platelets trending up. 5. Endo-blood glucose level well controlled on current regimen.  6. Continue Plavix  75mg  and Lovenox injections.   Plan: Imodium for diarrhea, heat therapy for left hand hematoma, work on ambulation with cardiac rehab, encourage incentive spirometer. Replace potassium. Continue diuretics for fluid overload.    LOS: 7 days    Elgie Collard 12/29/2016  I have seen and examined the patient and agree with the assessment and plan as outlined.  Slow but steady progress.  Maintaining NSR.  Need  more diuresis  Rexene Alberts, MD 12/29/2016 10:41 AM

## 2016-12-29 NOTE — Progress Notes (Signed)
Physical Therapy Treatment Patient Details Name: [name] MRN: [id number] DOB: [birth year] Today's Date: 12/29/2016    History of Present Illness 83 y.o. female with hx of CAD, CA, HTN, Gilbert's syndrome, TIA , and macular degeneration.  CABG x 3 on 12/12.    PT Comments    Patient received in bed napping and fatigued after working with cardiac rehab, but pleasant and willing to participate in skilled PT services. She continues to require assistance and cues to maintain sternal precautions during bed mobility and functional transfers; note she is able to perform sit to stand with Min guard from elevated surface but requires Mod assist for sit to stand from low/standard height surface. Able to gait train approximately 185f on 2LPM O2, limited by fatigue this afternoon. Performed functional exercises at edge of bed, then assisted patient to bathroom for which she required min guard to Mod assist for safe transfers while maintaining sternal precautions. Patient left in bed with all needs met this afternoon.     Follow Up Recommendations  CIR(pending progress )     Equipment Recommendations  (TBD )    Recommendations for Other Services Rehab consult;OT consult     Precautions / Restrictions Precautions Precautions: Sternal Precaution Comments: Educated on sternal precautions. Restrictions Weight Bearing Restrictions: Yes RUE Weight Bearing: (sternal precautions ) LUE Weight Bearing: (sternal precautions ) Other Position/Activity Restrictions: Sternal precautions    Mobility  Bed Mobility Overal bed mobility: Needs Assistance Bed Mobility: Rolling;Supine to Sit;Sit to Sidelying Rolling: Min guard Sidelying to sit: Min assist     Sit to sidelying: Min assist General bed mobility comments: VC to maintain sternal precautions; Min assist to raise trunk up to full sitting position at EOB   Transfers Overall transfer level: Needs assistance Equipment used: (Harmon Pierwalker  ) Transfers: Sit to/from Stand Sit to Stand: Mod assist;Min guard(mod assist from low surface, min guard from elevated surface )         General transfer comment: VC to maintain sternal precautions with transfers, rocking and multiple attempts with heart pillow hugged to chest   Ambulation/Gait Ambulation/Gait assistance: Min guard Ambulation Distance (Feet): 175 Feet Assistive device: (eva walker )       General Gait Details: correct use of Eva walker, gait distance limited by fatigue    Stairs            Wheelchair Mobility    Modified Rankin (Stroke Patients Only)       Balance Overall balance assessment: Needs assistance Sitting-balance support: No upper extremity supported;Feet supported Sitting balance-Leahy Scale: Good     Standing balance support: Bilateral upper extremity supported;During functional activity Standing balance-Leahy Scale: Fair                              Cognition Arousal/Alertness: Awake/alert Behavior During Therapy: WFL for tasks assessed/performed Overall Cognitive Status: Within Functional Limits for tasks assessed                                        Exercises General Exercises - Lower Extremity Long Arc Quad: Both;10 reps;Seated Hip ABduction/ADduction: Both;10 reps;Seated;Other (comment)(seated clams with manual resistance from PT ) Hip Flexion/Marching: Both;10 reps;Seated    General Comments General comments (skin integrity, edema, etc.): 2LPM O2 during gait       Pertinent Vitals/Pain Pain Assessment: No/denies pain  Pain Intervention(s): Monitored during session    Home Living                      Prior Function            PT Goals (current goals can now be found in the care plan section) Acute Rehab PT Goals Patient Stated Goal: Get well PT Goal Formulation: With patient Time For Goal Achievement: 01/09/17 Potential to Achieve Goals: Good Progress towards PT goals:  Progressing toward goals    Frequency    Min 3X/week      PT Plan Current plan remains appropriate    Co-evaluation              AM-PAC PT "6 Clicks" Daily Activity  Outcome Measure  Difficulty turning over in bed (including adjusting bedclothes, sheets and blankets)?: Unable Difficulty moving from lying on back to sitting on the side of the bed? : Unable Difficulty sitting down on and standing up from a chair with arms (e.g., wheelchair, bedside commode, etc,.)?: Unable Help needed moving to and from a bed to chair (including a wheelchair)?: A Little Help needed walking in hospital room?: A Little Help needed climbing 3-5 steps with a railing? : A Lot 6 Click Score: 11    End of Session Equipment Utilized During Treatment: Oxygen Activity Tolerance: Patient limited by fatigue Patient left: in bed;with call bell/phone within reach   PT Visit Diagnosis: Muscle weakness (generalized) (M62.81);Difficulty in walking, not elsewhere classified (R26.2);Pain Pain - part of body: (sternal )     Time: 1450-1530 PT Time Calculation (min) (ACUTE ONLY): 40 min  Charges:  $Gait Training: 8-22 mins $Therapeutic Exercise: 8-22 mins $Therapeutic Activity: 8-22 mins                    G Codes:       Deniece Ree PT, DPT, CBIS  Supplemental Physical Therapist [place]

## 2016-12-29 NOTE — Progress Notes (Signed)
Physical medicine rehabilitation consult requested chart reviewed. Patient progressing nicely ambulating 295 feet with minimal assistance and should continue to progress nicely. Hold on formal rehabilitation consult at this time as patient should continue to mobilize and be able to return home.

## 2016-12-29 NOTE — Progress Notes (Signed)
CARDIAC REHAB PHASE I   PRE:  Rate/Rhythm: 90 SR    BP: sitting 141/65    SaO2: 100 1L, 98 RA  MODE:  Ambulation: 230 ft   POST:  Rate/Rhythm: 86 SR    BP: sitting 156/69     SaO2: 98 RA, 100 1L in bed  Pt weak, feels tired. Reluctant to walk. To [facility] then pushed rollator in hall. Fairly steady, able to stand well. Slow pace, c/o arms fatiguing with distance. Pt just needs strengthening, more walking. To bed for nap after walk. Got SOB going to bed and wanted O2 back, did not need it walking. Practiced IS, 500 mL x3. Encouraged more walking, sitting in recliner, IS. [place], ACSM 12/29/2016 1:33 PM

## 2016-12-29 NOTE — Care Management Important Message (Signed)
Important Message  Patient Details  Name: [name] MRN: [id number] Date of Birth: July 06, 1930   Medicare Important Message Given:  Yes    [name] Abena 12/29/2016, 10:00 AM

## 2016-12-29 NOTE — Discharge Summary (Signed)
Physician Discharge Summary  Patient ID: [name] MRN: [id number] DOB/AGE: [birth year] 83 y.o.  Admit date: 12/22/2016 Discharge date: 01/02/2017   Admission Diagnoses: Patient Active Problem List   Diagnosis Date Noted  . On intra-aortic balloon pump assist   . Weakness of lower extremity 12/09/2015  . Angina pectoris ([place])   . Coronary artery disease involving native coronary artery of native heart with unstable angina pectoris ([place])   . Gastroenteritis, acute   . Cardiac arrest ([place])   . Left carotid stenosis 03/01/2012  . Mitral regurgitation 03/30/2011  . HYPERCHOLESTEROLEMIA  IIA 08/06/2008  . Essential hypertension 05/11/2008  . Coronary atherosclerosis 05/11/2008    Discharge Diagnoses:  Principal Problem:   S/P CABG x 3 Active Problems:   Essential hypertension   Coronary atherosclerosis   Angina at rest [facility])   Chest pain   Angina pectoris (HCC)   Coronary artery disease involving native coronary artery of native heart with unstable angina pectoris (HCC)   Non-ST elevation (NSTEMI) myocardial infarction (HCC)   Weakness of lower extremity   NSTEMI (non-ST elevated myocardial infarction) ([place])   On intra-aortic balloon pump assist   Discharged Condition: good  HPI:   Patient is an 83 year old female with known history of coronary artery disease status post multiple previous coronary interventions in the past, hypertension, hyperlipidemia, previous TIA, chronic diarrhea, depression, anxiety, and previous right lower lobectomy for atypical carcinoid tumor of the lung who presented with acute coronary syndrome and weakly positive cardiac enzymes, underwent catheterization earlier today at [facility] by Dr. Saunders Revel demonstrating critical left main and three-vessel coronary artery disease, and was transferred to [facility] for surgical consultation.  The patient's cardiac history dates back to 1986 when she underwent PTCA of the left anterior descending  coronary artery.  2001 she had PCI and stenting of the left anterior descending coronary artery and the left circumflex.  For the past 6 months the patient states she has been having symptoms of substernal chest pain associated with shortness of breath.  Symptoms initially were associated with activity or meals and frequently relieved by sublingual nitroglycerin.  She was hospitalized in June 2018 and ruled out for myocardial infarction.  Noninvasive stress test was recommended but the patient declined.  Symptoms have progressed recently and on the afternoon of December 21, 2016 she was awoken with severe pain between her shoulder blades chest and upper abdomen, all associated with shortness of breath.  Symptoms persisted for more than 20 minutes.  EMS was called and she was taken to [facility] where ECG revealed sinus rhythm without acute changes.  Initial troponin was normal but subsequent enzymes increased to 0.29.  The patient underwent diagnostic cardiac catheterization earlier today demonstrating critical left main and three-vessel coronary artery disease.  The patient had some chest pain prior to and during catheterization, and intra-aortic balloon pump was placed.  The patient has been pain-free ever since.  She was transferred to [facility] for further management.  The patient is divorced and lives with her son and his family in [place].  She has remained reasonably functionally independent although she admits that she has been slowing down considerably over the past 6 months.  She has been severely limited by generalized fatigue, exertional shortness of breath, and frequent chest pain.  She denies resting shortness of breath, PND, orthopnea, or lower extremity edema.  She walks without need for cane or other mechanical support.   Hospital Course:  On 12/23/2016 Ms.  Rochette underwent a coronary bypass grafting x3.  She tolerated the procedure well and was transferred to the ICU.  She was  extubated in a timely manner.  Postop day 1 she was maintaining normal sinus rhythm on nitro for hypertension.  She was breathing comfortably on 4 L/min.  We initiated a diuretic regimen for fluid overload.  She remained thrombocytopenic and we continue to trend her platelet count.  We discontinued her balloon pump and lines.  Postop day 2 we continue to wean her oxygen support as tolerated.  She did have some expected atelectasis which was very mild.  We discontinued her chest tubes.  We started dual antiplatelet therapy.  We initiated a low-dose beta-blocker for heart rate and blood pressure control.  We began to mobilize the patient and consulted physical therapy for assistance.  Postop day 4 she continued to progress.  Worked on mobilization and  Diuresis.  Postop day 5 she has some atrial fibrillation and was given an amiodarone bolus.  She converted back to normal sinus rhythm.  We restarted her losartan and increased her Lopressor for better blood pressure and heart rate score.  Continue to wean her oxygen as tolerated.  She was stable to transfer to the telemetry unit for continued care.  Postop day 6 she remained in normal sinus rhythm.  We replaced her potassium for hypokalemia.  She did have some excessive diarrhea, therefore we started Imodium.  We will continue to monitor her left hand hematoma.  Her duplex was negative for DVT.  We continue her diuretic regimen since she still remain fluid overloaded.  We continue to work on ambulation and building strength cardiac rehab.  We encourage incentive spirometer for continued pulmonary toilet. She continued to progress at a slow pace.  She is maintaining NSR.  Her pacing wires were removed prior to discharge without difficulty.  She remained volume overloaded and was treated with IV lasix.  Her weight has trended down with improvement of LE edema.  She has been transitioned to an oral regimen of Lasix.  She was hypertensive and her lopressor and cozaar were  increased.  Despite these measures she remained hypertensive and was started on Norvasc which help with better blood pressure control.  She remains deconditioned and requires a lot of encouragement to ambulate.  She will require SNF at discharge which has been arranged.  She is tolerating a diet.  Her pain is well controlled.  She is medically stable for discharge to SNF today.   Consults: None  Significant Diagnostic Studies:   CLINICAL DATA:  CABG  EXAM: CHEST  2 VIEW  COMPARISON:  12/26/2016  FINDINGS: Bibasilar atelectasis and effusion unchanged. Negative for edema. Prior CABG.  IMPRESSION: Bibasilar atelectasis and bilateral pleural effusions unchanged.   Electronically Signed   By: Franchot Gallo M.D.   On: 12/29/2016 09:25  Treatments:   CARDIOTHORACIC SURGERY OPERATIVE NOTE  Date of Procedure:    12/23/2016  Preoperative Diagnosis:        Critical Left Main and 3-vessel Coronary Artery Disease  Unstable Angina Pectoris  S/P Acute Non-STEMI  S/P IABP Placement  Postoperative Diagnosis:    Same  Procedure:        Coronary Artery Bypass Grafting x 3              Left Internal Mammary Artery to Distal Left Anterior Descending Coronary Artery             Saphenous Vein Graft to Posterior Descending Coronary  Artery             Saphenous Vein Graft to Obtuse Marginal Branch of Left Circumflex Coronary Artery             Endoscopic Vein Harvest from Right Thigh and Lower Leg  Surgeon:        Valentina Gu. Roxy Manns, MD  Assistant:       Nicholes Rough, PA-C  Anesthesia:    Lillia Abed, MD  Operative Findings: ? Normal left ventricular systolic function ? Good quality left internal mammary artery conduit ? Fair quality saphenous vein conduit ? Fair quality target vessels for grafting   Discharge Exam: Blood pressure (!) 161/84, pulse 100, temperature 98.5 F (36.9 C), temperature source Oral, resp. rate (!) 24, height 5\' 8"  (1.727 m), weight 171  lb 1.6 oz (77.6 kg), SpO2 100 %.   Disposition: SNF  Discharge Medications:  Discharge Instructions    Amb Referral to Cardiac Rehabilitation   Complete by:  As directed    Diagnosis:  CABG   CABG X ___:  3     Allergies as of 01/02/2017      Reactions   Biaxin [clarithromycin]    depressed   Celecoxib Other (See Comments)   Reaction: Unknown   Ciprofloxacin    Codeine Other (See Comments)   Reaction: Unknown   Hydromorphone    Lisinopril    cough   Macrobid [nitrofurantoin Monohyd Macro] Other (See Comments)   Reaction: Unknown   Oxycodone Hcl Other (See Comments)   Reaction: Unknown   Prednisone    Sertraline Hcl Other (See Comments)   Reaction: Unknown   Penicillins Rash   Reaction: Unknown      Medication List    STOP taking these medications   HYDROcodone-acetaminophen 5-325 MG tablet Commonly known as:  NORCO/VICODIN   isosorbide mononitrate 30 MG 24 hr tablet Commonly known as:  IMDUR   metoprolol succinate 25 MG 24 hr tablet Commonly known as:  TOPROL-XL     TAKE these medications   acetaminophen 325 MG tablet Commonly known as:  TYLENOL Take 2 tablets (650 mg total) by mouth every 6 (six) hours as needed for mild pain or headache.   amiodarone 200 MG tablet Commonly known as:  PACERONE Take 1 tablet (200 mg total) by mouth 2 (two) times daily. X 7 days, then decrease to 200 mg daily   amLODipine 10 MG tablet Commonly known as:  NORVASC Take 1 tablet (10 mg total) by mouth daily.   aspirin 81 MG tablet Take 81 mg by mouth daily.   atorvastatin 80 MG tablet Commonly known as:  LIPITOR Take 1 tablet (80 mg total) by mouth daily. What changed:    medication strength  how much to take   CALCIUM 600 + D PO Take 1 tablet by mouth daily. 600 mg/ 400 mg   clopidogrel 75 MG tablet Commonly known as:  PLAVIX Take 1 tablet (75 mg total) by mouth daily.   estrogens (conjugated) 0.3 MG tablet Commonly known as:  PREMARIN Take 0.3 mg by  mouth daily. Take daily for 21 days then do not take for 7 days.   furosemide 40 MG tablet Commonly known as:  LASIX Take 1 tablet (40 mg total) by mouth daily. What changed:    medication strength  how much to take   LORazepam 0.5 MG tablet Commonly known as:  ATIVAN Take 0.5 mg by mouth every 8 (eight) hours as needed for anxiety. What changed:  Another medication with the same name was added. Make sure you understand how and when to take each.   LORazepam 0.5 MG tablet Commonly known as:  ATIVAN Take 1 tablet (0.5 mg total) by mouth every 8 (eight) hours as needed for anxiety. What changed:  You were already taking a medication with the same name, and this prescription was added. Make sure you understand how and when to take each.   losartan 100 MG tablet Commonly known as:  COZAAR Take 1 tablet (100 mg total) by mouth daily.   metoprolol tartrate 50 MG tablet Commonly known as:  LOPRESSOR Take 1 tablet (50 mg total) by mouth 2 (two) times daily.   multivitamin tablet Take 1 tablet by mouth daily.   nitroGLYCERIN 0.4 MG SL tablet Commonly known as:  NITROSTAT PLACE 1 TABLET (0.4 MG TOTAL) UNDER THE TONGUE EVERY 5 (FIVE) MINUTES AS NEEDED FOR CHEST PAIN.   ondansetron 4 MG tablet Commonly known as:  ZOFRAN Take 1 tablet (4 mg total) by mouth every 6 (six) hours as needed for nausea.   pantoprazole 40 MG tablet Commonly known as:  PROTONIX Take 40 mg by mouth daily.   potassium chloride 20 MEQ/15ML (10%) Soln Take 30 mLs (40 mEq total) by mouth daily.   traMADol 50 MG tablet Commonly known as:  ULTRAM Take 1-2 tablets (50-100 mg total) by mouth every 4 (four) hours as needed for moderate pain.       Contact information for follow-up providers    Rexene Alberts, MD Follow up.   Specialty:  Cardiothoracic Surgery Why:  Your appointment is on 02/01/2017 at 2:30pm. Please arrive at 2:00pm for a chest xray located at [facility] which is on the first floor  of our building. Contact information: [address] [place] [address] [phone]        Lajean Manes, MD. Call in 1 day(s).   Specialty:  Internal Medicine Contact information: [address] [place] [address] [place] [address] [phone]        Minna Merritts, MD Follow up.   Specialty:  Cardiology Contact information: [place] [address] [phone]        Nelva Bush, MD Follow up.   Specialty:  Cardiology Why:  Please call to make an appointment.  Contact information: [place] [address] [phone]            Contact information for after-discharge care    Destination    [facility] .   Service:  Skilled Nursing Contact information: [address] [place] [phone]                 The patient has been discharged on:   1.Beta Blocker:  Yes [ x  ]                              No   [   ]                              If No, reason:  2.Ace Inhibitor/ARB: Yes [ x  ]  No  [    ]                                     If No, reason:  3.Statin:   Yes [ x  ]                  No  [   ]                  If No, reason:  4.Ecasa:  Yes  [ x  ]                  No   [   ]                  If No, reason:    Signed: Laythan Hayter 01/02/2017, 8:24 AM

## 2016-12-29 NOTE — Discharge Instructions (Signed)

## 2016-12-30 ENCOUNTER — Telehealth: Payer: Self-pay

## 2016-12-30 LAB — BASIC METABOLIC PANEL
Anion gap: 9 (ref 5–15)
BUN: 17 mg/dL (ref 6–20)
CHLORIDE: 99 mmol/L — AB (ref 101–111)
CO2: 31 mmol/L (ref 22–32)
Calcium: 8.4 mg/dL — ABNORMAL LOW (ref 8.9–10.3)
Creatinine, Ser: 0.85 mg/dL (ref 0.44–1.00)
GFR calc Af Amer: >60 mL/min (ref >60)
GFR calc non Af Amer: >60 mL/min (ref >60)
Glucose, Bld: 104 mg/dL — ABNORMAL HIGH (ref 65–99)
POTASSIUM: 3.4 mmol/L — AB (ref 3.5–5.1)
SODIUM: 139 mmol/L (ref 135–145)

## 2016-12-30 LAB — GLUCOSE, CAPILLARY
GLUCOSE-CAPILLARY: 104 mg/dL — AB (ref 65–99)
GLUCOSE-CAPILLARY: 110 mg/dL — AB (ref 65–99)
GLUCOSE-CAPILLARY: 118 mg/dL — AB (ref 65–99)
GLUCOSE-CAPILLARY: 98 mg/dL (ref 65–99)
Glucose-Capillary: 127 mg/dL — ABNORMAL HIGH (ref 65–99)

## 2016-12-30 MED ORDER — POTASSIUM CHLORIDE 20 MEQ/15ML (10%) PO SOLN
20.0000 meq | Freq: Two times a day (BID) | ORAL | Status: DC
  Administered 2016-12-30 – 2016-12-31 (×3): 20 meq via ORAL
  Filled 2016-12-30 (×3): qty 15

## 2016-12-30 MED ORDER — POTASSIUM CHLORIDE 20 MEQ/15ML (10%) PO SOLN: 20.0000 meq | Freq: Once | ORAL | Status: DC

## 2016-12-30 MED ORDER — ACETAMINOPHEN 325 MG PO TABS
650.0000 mg | ORAL_TABLET | Freq: Four times a day (QID) | ORAL | Status: DC | PRN
  Administered 2016-12-30: 650 mg via ORAL
  Filled 2016-12-30: qty 2

## 2016-12-30 MED ORDER — INSULIN ASPART 100 UNIT/ML ~~LOC~~ SOLN: 0.0000 [IU] | SUBCUTANEOUS | Status: DC

## 2016-12-30 NOTE — Progress Notes (Signed)
Physical Therapy Treatment Patient Details Name: [name] MRN: [id number] DOB: 02/08/1930 Today's Date: 12/30/2016    History of Present Illness 83 y.o. female with hx of CAD, CA, HTN, Gilbert's syndrome, TIA , and macular degeneration.  CABG x 3 on 12/12.    PT Comments    Patient received sitting upright at EOB, pleasant and willing to work with skilled PT services. Gait trained approximately 268f with EHarmon Pierwalker this morning, patient limited by fatigue and weakness in LEs. [facility], able to perform toileting with S from PT but did need assist in adjusting briefs; ambulated around bed with rolling walker to chair. Patient continues to be limited by fatigue but participates well with skilled PT services and remains motivated. Patient left up in chair with all needs met this morning.     Follow Up Recommendations  CIR(pending progress )     Equipment Recommendations  (TBD)    Recommendations for Other Services Rehab consult;OT consult     Precautions / Restrictions Precautions Precautions: Sternal Restrictions Weight Bearing Restrictions: Yes Other Position/Activity Restrictions: Sternal precautions    Mobility  Bed Mobility               General bed mobility comments: DNT, received sitting at EOB   Transfers Overall transfer level: Needs assistance Equipment used: (eva walker ) Transfers: Sit to/from Stand Sit to Stand: Min guard         General transfer comment: occasional VC to maintain sternal precautions   Ambulation/Gait Ambulation/Gait assistance: Supervision Ambulation Distance (Feet): 200 Feet Assistive device: (eva walker ) Gait Pattern/deviations: Step-through pattern;Decreased stride length     General Gait Details: correct use of Eva walker, gait distance limited by fatigue    Stairs            Wheelchair Mobility    Modified Rankin (Stroke Patients Only)       Balance Overall  balance assessment: Needs assistance Sitting-balance support: Feet supported;No upper extremity supported Sitting balance-Leahy Scale: Good     Standing balance support: Bilateral upper extremity supported;During functional activity Standing balance-Leahy Scale: Fair                              Cognition Arousal/Alertness: Awake/alert Behavior During Therapy: WFL for tasks assessed/performed Overall Cognitive Status: Within Functional Limits for tasks assessed                                        Exercises      General Comments General comments (skin integrity, edema, etc.): 2LPM O2 during gait       Pertinent Vitals/Pain Pain Assessment: No/denies pain Pain Intervention(s): Monitored during session    Home Living                      Prior Function            PT Goals (current goals can now be found in the care plan section) Acute Rehab PT Goals Patient Stated Goal: Get well PT Goal Formulation: With patient Time For Goal Achievement: 01/09/17 Potential to Achieve Goals: Good Progress towards PT goals: Progressing toward goals    Frequency    Min 3X/week      PT Plan Current plan remains appropriate    Co-evaluation  AM-PAC PT "6 Clicks" Daily Activity  Outcome Measure  Difficulty turning over in bed (including adjusting bedclothes, sheets and blankets)?: Unable Difficulty moving from lying on back to sitting on the side of the bed? : Unable Difficulty sitting down on and standing up from a chair with arms (e.g., wheelchair, bedside commode, etc,.)?: Unable Help needed moving to and from a bed to chair (including a wheelchair)?: A Little Help needed walking in hospital room?: A Little Help needed climbing 3-5 steps with a railing? : A Little 6 Click Score: 12    End of Session Equipment Utilized During Treatment: Oxygen Activity Tolerance: Patient limited by fatigue Patient left: in  chair;with call bell/phone within reach   PT Visit Diagnosis: Muscle weakness (generalized) (M62.81);Difficulty in walking, not elsewhere classified (R26.2);Pain Pain - part of body: (sternal precautions )     Time: 2355-7322 PT Time Calculation (min) (ACUTE ONLY): 27 min  Charges:  $Gait Training: 8-22 mins $Therapeutic Activity: 8-22 mins                    G Codes:       Deniece Ree PT, DPT, CBIS  Supplemental Physical Therapist [place]

## 2016-12-30 NOTE — Telephone Encounter (Signed)
l mom to call and schedule with Dr. Rockey Situ

## 2016-12-30 NOTE — Progress Notes (Addendum)
[address]       [place] [address]             [phone]      7 Days Post-Op Procedure(s) (LRB): CORONARY ARTERY BYPASS GRAFTING (CABG) TIMES THREE USING LEFT INTERNAL MAMMARY ARTERY AND LEFT SAPHENOUS LEG VEIN HARVESTED ENDOSCOPICALLY, (N/A) TRANSESOPHAGEAL ECHOCARDIOGRAM (TEE) (N/A) Subjective: Depressed this morning. Asking for when she can ger her ativan.   Objective: Vital signs in last 24 hours: Temp:  [98.1 F (36.7 C)-98.4 F (36.9 C)] 98.1 F (36.7 C) (12/19 0418) Pulse Rate:  [77-88] 77 (12/19 0418) Cardiac Rhythm: Normal sinus rhythm (12/19 0701) Resp:  [17-29] 18 (12/19 0418) BP: (141-162)/(65-82) 149/66 (12/19 0418) SpO2:  [95 %-100 %] 100 % (12/19 0418) Weight:  [182 lb 3.2 oz (82.6 kg)] 182 lb 3.2 oz (82.6 kg) (12/19 0250)     Intake/Output from previous day: 12/18 0701 - 12/19 0700 In: 960 [P.O.:960] Out: 2400 [Urine:2400] Intake/Output this shift: No intake/output data recorded.  General appearance: alert, cooperative and no distress Heart: regular rate and rhythm, S1, S2 normal, no murmur, click, rub or gallop Lungs: clear to auscultation bilaterally Abdomen: soft, non-tender; bowel sounds normal; no masses,  no organomegaly Extremities: 1+ nonpitting pedal edema. 1-2+ upper extremity edema Wound: clean and dry. EVH site c/d/i  Lab Results: Recent Labs    12/28/16 0240 12/29/16 0350  WBC 8.5 6.7  HGB 8.7* 8.2*  HCT 26.8* 25.6*  PLT 192 218   BMET:  Recent Labs    12/29/16 0350 12/30/16 0220  NA 138 139  K 3.2* 3.4*  CL 100* 99*  CO2 30 31  GLUCOSE 109* 104*  BUN 18 17  CREATININE 0.80 0.85  CALCIUM 8.5* 8.4*    PT/INR: No results for input(s): LABPROT, INR in the last 72 hours. ABG    Component Value Date/Time   PHART 7.319 (L) 12/24/2016 0715   HCO3 20.9 12/24/2016 0715   TCO2 23 12/24/2016 1633   ACIDBASEDEF 5.0 (H) 12/24/2016 0715   O2SAT 99.0 12/24/2016 0715   CBG (last 3)  Recent Labs   12/29/16 1947 12/30/16 0014 12/30/16 0416  GLUCAP 121* 98 104*    Assessment/Plan: S/P Procedure(s) (LRB): CORONARY ARTERY BYPASS GRAFTING (CABG) TIMES THREE USING LEFT INTERNAL MAMMARY ARTERY AND LEFT SAPHENOUS LEG VEIN HARVESTED ENDOSCOPICALLY, (N/A) TRANSESOPHAGEAL ECHOCARDIOGRAM (TEE) (N/A)  1. CV-some postopatrial fibrillation over weekend and Monday, now in NSR in the 80s-90s. BP better controlled on Losartan at her home dose and Lopressor to 50mg  BID.  ContinueDAPTand atorvastatin.  2. Pulm-tolerating room air with excellent oxygenation. Encourage incentive spirometer use. CXR this morning is stable with probable small bilateral pleural effusions.  3. Renal-creatinine 0.85, hypokalemia switch to k-dur 20MEQ BID liquid. Good urine output. Remains about 11lbs fluid overloaded. Switched to IV Lasix 40mg  BID.  4. H and H stable, expected acute blood loss anemia. Platelets trending up. 5. Endo-blood glucose level well controlled on current regimen.  6. Continue Plavix 75mg  and Lovenox injections.  7. Diarrhea-resolved with imodium  Plan: Continue IV Lasix today, may need a few days worth. Remains fluid overloaded. Work on ambulation. Work on Chiropodist. Replace potassium. CIR consult requested. If not a candidate will require SNF.     LOS: 8 days    Elgie Collard 12/30/2016   I have seen and examined the patient and agree with the assessment and plan as outlined.  Slow progress.  Anticipate d/c to SNF in  2-3 days if not a candidate for d/c to CIR service.  Rexene Alberts, MD 12/30/2016 2:39 PM

## 2016-12-30 NOTE — Progress Notes (Signed)
M.D., Does patient still need CBG'S q 4 hrs around the clock?

## 2016-12-30 NOTE — Telephone Encounter (Signed)
-----   Message from Vanessa Ralphs, RN sent at 12/30/2016  8:00 AM EST ----- Regarding: RE: follow up needed please Patient will need f/u with Dr Rockey Situ as well as Dr Roxy Manns. It looks like patient is already scheduled with Dr Roxy Manns for this Friday. I would put patient to f/u with Dr Rockey Situ as soon as he's back in the office in January.  Thanks, Anderson Malta  ----- Message ----- From: Blain Pais Sent: 12/29/2016   3:57 PM To: Vanessa Ralphs, RN Subject: FW: follow up needed please                    Can you help me with this? Should this pt f/u with dr Rockey Situ or dr Roxy Manns? ----- Message ----- From: Candis Schatz Sent: 12/29/2016  10:54 AM To: Rebeca Alert [place] Support Pool Subject: follow up needed please                          ----- Message ----- From: Elgie Collard, PA-C Sent: 12/29/2016  10:09 AM To: Candis Schatz  On 12/12 Ms. Mariane Masters underwent CABG x 3 with Dr. Roxy Manns. She will need a follow-up appointment. Thanks!   Referring: Dr. Saunders Revel

## 2016-12-30 NOTE — Progress Notes (Signed)
CARDIAC REHAB PHASE I   PRE:  Rate/Rhythm: 78 SR  BP:  Supine: 151/72  Sitting:   Standing:    SaO2: 98% 2L  MODE:  Ambulation: 350 ft   POST:  Rate/Rhythm: 95 SR  BP:  Supine:   Sitting: 154/87  Standing:    SaO2: 98%RA hall and room 1420-1500 Pt walked 350 ft on RA with rollator and asst x 1. Sat once to rest and took several standing rest breaks. Does not need oxygen to walk. Requested in once back to bed. Assisted to bathroom after walk.   Graylon Good, RN BSN  12/30/2016 2:57 PM

## 2016-12-31 LAB — GLUCOSE, CAPILLARY: GLUCOSE-CAPILLARY: 116 mg/dL — AB (ref 65–99)

## 2016-12-31 MED ORDER — FUROSEMIDE 40 MG PO TABS
40.0000 mg | ORAL_TABLET | Freq: Every day | ORAL | Status: DC
  Administered 2016-12-31: 40 mg via ORAL
  Filled 2016-12-31: qty 1

## 2016-12-31 MED ORDER — POTASSIUM CHLORIDE 20 MEQ/15ML (10%) PO SOLN
40.0000 meq | Freq: Two times a day (BID) | ORAL | Status: DC
  Administered 2016-12-31 – 2017-01-02 (×4): 40 meq via ORAL
  Filled 2016-12-31 (×4): qty 30

## 2016-12-31 MED ORDER — FUROSEMIDE 10 MG/ML IJ SOLN
40.0000 mg | Freq: Two times a day (BID) | INTRAMUSCULAR | Status: DC
  Administered 2016-12-31 – 2017-01-02 (×4): 40 mg via INTRAVENOUS
  Filled 2016-12-31 (×4): qty 4

## 2016-12-31 MED ORDER — AMIODARONE HCL 200 MG PO TABS
200.0000 mg | ORAL_TABLET | Freq: Two times a day (BID) | ORAL | Status: DC
  Administered 2016-12-31 – 2017-01-02 (×4): 200 mg via ORAL
  Filled 2016-12-31 (×4): qty 1

## 2016-12-31 NOTE — Progress Notes (Signed)
CARDIAC REHAB PHASE I   PRE:  Rate/Rhythm: 83 SR    BP: sitting 146/84    SaO2: 100 2L  MODE:  Ambulation: 320 ft   POST:  Rate/Rhythm: 94 SR    BP: sitting 166/90     SaO2: 100 RA  Pt continues to slowly progress. She moves well but continues to c/o weakness and fatigue. Rested at 1/2 way for 5 min. She is not requiring O2. Using rollator. To BR after walk then bed. Requires much encouragement. Encouraged IS use.  [id number]   Darrick Meigs CES, ACSM 12/31/2016 10:30 AM

## 2016-12-31 NOTE — Progress Notes (Signed)
Physical Therapy Treatment Patient Details Name: [name] MRN: [id number] DOB: 1930-07-03 Today's Date: 12/31/2016    History of Present Illness 83 y.o. female with hx of CAD, CA, HTN, Gilbert's syndrome, TIA , and macular degeneration.  CABG x 3 on 12/12.    PT Comments    Patient received in bed, pleasant and willing to participate in PT session this morning; she continues to require Min assist to get OOB however is able to perform stand pivot transfers and sit to stand generally with min guard, occasional verbal cues for precautions. Able to gait train approximately 338f in hallway today with rollator, 1 seated rest break, and with O2 sats on room air remaining 98% and above. Patient left at EOB with lunch set up, all needs otherwise met.     Follow Up Recommendations  CIR(pending progress)     Equipment Recommendations  None recommended by PT(TBD)    Recommendations for Other Services Rehab consult;OT consult     Precautions / Restrictions Precautions Precautions: Sternal Precaution Comments: Educated on sternal precautions. Restrictions Weight Bearing Restrictions: Yes(sternal precautions ) Other Position/Activity Restrictions: Sternal precautions    Mobility  Bed Mobility Overal bed mobility: Needs Assistance Bed Mobility: Sidelying to Sit;Rolling Rolling: Supervision Sidelying to sit: Min assist          Transfers Overall transfer level: Needs assistance Equipment used: 4-wheeled walker(rollator ) Transfers: Sit to/from Stand Sit to Stand: Min guard Stand pivot transfers: Min guard       General transfer comment: occasional VC to maintain sternal precautions   Ambulation/Gait Ambulation/Gait assistance: Supervision Ambulation Distance (Feet): 300 Feet Assistive device: 4-wheeled walker(rollator ) Gait Pattern/deviations: Step-through pattern;Decreased stride length     General Gait Details: gait distance limited by fatigue, required 1 seated  rest break; O2 sats remained 98% and above    Stairs            Wheelchair Mobility    Modified Rankin (Stroke Patients Only)       Balance Overall balance assessment: Needs assistance Sitting-balance support: Feet supported;No upper extremity supported Sitting balance-Leahy Scale: Good     Standing balance support: Bilateral upper extremity supported;During functional activity Standing balance-Leahy Scale: Fair                              Cognition Arousal/Alertness: Awake/alert Behavior During Therapy: WFL for tasks assessed/performed Overall Cognitive Status: Within Functional Limits for tasks assessed                                        Exercises      General Comments General comments (skin integrity, edema, etc.): room air during gait       Pertinent Vitals/Pain Pain Assessment: No/denies pain Pain Intervention(s): Monitored during session    Home Living                      Prior Function            PT Goals (current goals can now be found in the care plan section) Acute Rehab PT Goals Patient Stated Goal: Get well PT Goal Formulation: With patient Time For Goal Achievement: 01/09/17 Potential to Achieve Goals: Good Progress towards PT goals: Progressing toward goals    Frequency    Min 3X/week      PT Plan Current  plan remains appropriate    Co-evaluation              AM-PAC PT "6 Clicks" Daily Activity  Outcome Measure  Difficulty turning over in bed (including adjusting bedclothes, sheets and blankets)?: Unable Difficulty moving from lying on back to sitting on the side of the bed? : Unable Difficulty sitting down on and standing up from a chair with arms (e.g., wheelchair, bedside commode, etc,.)?: Unable Help needed moving to and from a bed to chair (including a wheelchair)?: A Little Help needed walking in hospital room?: A Little Help needed climbing 3-5 steps with a railing? : A  Little 6 Click Score: 12    End of Session Equipment Utilized During Treatment: Gait belt Activity Tolerance: Patient limited by fatigue Patient left: in bed;Other (comment)(seated at EOB)   PT Visit Diagnosis: Muscle weakness (generalized) (M62.81);Difficulty in walking, not elsewhere classified (R26.2);Pain Pain - part of body: (sternal precautions)     Time: 4068-4033 PT Time Calculation (min) (ACUTE ONLY): 25 min  Charges:  $Gait Training: 8-22 mins $Therapeutic Activity: 8-22 mins                    G Codes:        Deniece Ree PT, DPT, CBIS  Supplemental Physical Therapist [place]

## 2016-12-31 NOTE — Progress Notes (Addendum)
      [address]       [name],[place] [address]             [phone]      8 Days Post-Op Procedure(s) (LRB): CORONARY ARTERY BYPASS GRAFTING (CABG) TIMES THREE USING LEFT INTERNAL MAMMARY ARTERY AND LEFT SAPHENOUS LEG VEIN HARVESTED ENDOSCOPICALLY, (N/A) TRANSESOPHAGEAL ECHOCARDIOGRAM (TEE) (N/A)   Subjective:  Complains of fatigue this morning.  States she has been sitting on the side of the bed for awhile.  She complains of pain along her right arm IV site and states there is a "knot" present.  Continued swelling, bruising, discomfort from her left arm.    Objective: Vital signs in last 24 hours: Temp:  [97.6 F (36.4 C)-98.7 F (37.1 C)] 97.6 F (36.4 C) (12/20 0434) Pulse Rate:  [75-85] 75 (12/20 0434) Cardiac Rhythm: Normal sinus rhythm (12/19 1900) Resp:  [24] 24 (12/20 0434) BP: (161-168)/(71-94) 161/71 (12/20 0434) SpO2:  [100 %] 100 % (12/20 0434) Weight:  [179 lb 6.4 oz (81.4 kg)] 179 lb 6.4 oz (81.4 kg) (12/20 0447)  Intake/Output from previous day: 12/19 0701 - 12/20 0700 In: -  Out: 780 [Urine:780]  General appearance: alert, cooperative and no distress Heart: regular rate and rhythm Lungs: clear to auscultation bilaterally Abdomen: soft, non-tender; bowel sounds normal; no masses,  no organomegaly Extremities: edema trace, LUE remains edeamatous with hematoma and extensive ecchymosis, RUE with small knot medial of IV site, no erythema present Wound: clean and dry  Lab Results: Recent Labs    12/29/16 0350  WBC 6.7  HGB 8.2*  HCT 25.6*  PLT 218   BMET:  Recent Labs    12/29/16 0350 12/30/16 0220  NA 138 139  K 3.2* 3.4*  CL 100* 99*  CO2 30 31  GLUCOSE 109* 104*  BUN 18 17  CREATININE 0.80 0.85  CALCIUM 8.5* 8.4*    PT/INR: No results for input(s): LABPROT, INR in the last 72 hours. ABG    Component Value Date/Time   PHART 7.319 (L) 12/24/2016 0715   HCO3 20.9 12/24/2016 0715   TCO2 23 12/24/2016 1633   ACIDBASEDEF  5.0 (H) 12/24/2016 0715   O2SAT 99.0 12/24/2016 0715   CBG (last 3)  Recent Labs    12/30/16 1609 12/30/16 2058 12/31/16 0608  GLUCAP 110* 118* 116*    Assessment/Plan: S/P Procedure(s) (LRB): CORONARY ARTERY BYPASS GRAFTING (CABG) TIMES THREE USING LEFT INTERNAL MAMMARY ARTERY AND LEFT SAPHENOUS LEG VEIN HARVESTED ENDOSCOPICALLY, (N/A) TRANSESOPHAGEAL ECHOCARDIOGRAM (TEE) (N/A)  1. CV- NSR, BP well controlled- continue Lopressor, Cozaar, Amiodarone 2. Pulm- no acute issues, continue IS 3. Renal- creatinine WNL, weight is trending down, will start oral Lasix today 4. LUE hematoma, ecchymosis- DVT study is negative 5. Expected post operative blood loss anemia- Hgb 8.2 stable 6. CBGs controlled- will d/c SSIP, patient is not a diabetic 7. Dispo- patient stable, continue diuretics, continue supportive therapy for LUE, CIR feels patient is not a candidate for admission, will plan for SNF placement in the next 24-48 hours pending bed availability   LOS: 9 days    [name] 12/31/2016  I have seen and examined the patient and agree with the assessment and plan as outlined.  Still volume overloaded.   Will resume IV lasix.  BP trending up.  Need to verify patient is receiving metoprolol and losartan.  May need to increase.  Audrey Alberts, MD 12/31/2016 5:01 PM

## 2016-12-31 NOTE — Clinical Social Work Note (Signed)
Clinical Social Work Assessment  Patient Details  Name: [name] MRN: [id number] Date of Birth: 22-Aug-1930  Date of referral:  12/30/16               Reason for consult:  Facility Placement, Discharge Planning                Permission sought to share information with:  Facility Sport and exercise psychologist, Family Supports Permission granted to share information::  Yes, Verbal Permission Granted  Name::     Marylyn Ishihara  Agency::  SNF  Relationship::  Son, Ship broker Information:     Housing/Transportation Living arrangements for the past 2 months:  Single Family Home Source of Information:  Patient, Adult Children Patient Interpreter Needed:  None Criminal Activity/Legal Involvement Pertinent to Current Situation/Hospitalization:  No - Comment as needed Significant Relationships:  Adult Children, Other Family Members Lives with:  Self, Adult Children Do you feel safe going back to the place where you live?  Yes Need for family participation in patient care:  No (Coment)  Care giving concerns:  Patient lives at home with son who works during the day, and will benefit from short term rehab at discharge prior to returning home.    Social Worker assessment / plan:  CSW met with patient and patient's son at bedside per RN request to confirm availability at [facility]. CSW contacted Admissions at [facility] and confirmed referral had been received, approved, and the facility would have a bed for the patient at discharge. CSW provided information to patient and patient's son. CSW confirmed with patient's son that he would be able to transport her at discharge, and he would be notified. CSW will continue to follow.  Employment status:  Retired Forensic scientist:  Medicare PT Recommendations:  [place] / Referral to community resources:  [place]  Patient/Family's Response to care:  Patient and patient's son are agreeable to SNF  placement.  Patient/Family's Understanding of and Emotional Response to Diagnosis, Current Treatment, and Prognosis:  Patient and patient's son were appreciative of information and the update. Patient's son discussed how the patient thought she would be transitioning tomorrow to SNF, but didn't know where that information was coming from. Patient's son appreciated reassurance that he would be updated on the patient's status and discharge date.   Emotional Assessment Appearance:  Appears stated age Attitude/Demeanor/Rapport:    Affect (typically observed):  Appropriate, Calm Orientation:  Oriented to Self, Oriented to Place, Oriented to  Time, Oriented to Situation Alcohol / Substance use:  Not Applicable Psych involvement (Current and /or in the community):  No (Comment)  Discharge Needs  Concerns to be addressed:  Care Coordination Readmission within the last 30 days:  Yes Current discharge risk:  Physical Impairment, Dependent with Mobility Barriers to Discharge:  Continued Medical Work up   [employer], [place] 12/31/2016, 12:47 PM

## 2017-01-01 ENCOUNTER — Other Ambulatory Visit: Payer: Self-pay | Admitting: Cardiovascular Disease

## 2017-01-01 LAB — CBC
HCT: 29.6 % — ABNORMAL LOW (ref 36.0–46.0)
Hemoglobin: 9.5 g/dL — ABNORMAL LOW (ref 12.0–15.0)
MCH: 29.1 pg (ref 26.0–34.0)
MCHC: 32.1 g/dL (ref 30.0–36.0)
MCV: 90.8 fL (ref 78.0–100.0)
PLATELETS: 375 10*3/uL (ref 150–400)
RBC: 3.26 MIL/uL — ABNORMAL LOW (ref 3.87–5.11)
RDW: 15 % (ref 11.5–15.5)
WBC: 7.5 10*3/uL (ref 4.0–10.5)

## 2017-01-01 LAB — BASIC METABOLIC PANEL
Anion gap: 11 (ref 5–15)
BUN: 13 mg/dL (ref 6–20)
CALCIUM: 8.9 mg/dL (ref 8.9–10.3)
CO2: 30 mmol/L (ref 22–32)
Chloride: 97 mmol/L — ABNORMAL LOW (ref 101–111)
Creatinine, Ser: 0.9 mg/dL (ref 0.44–1.00)
GFR calc Af Amer: >60 mL/min (ref >60)
GFR, EST NON AFRICAN AMERICAN: 57 mL/min — AB (ref >60)
GLUCOSE: 118 mg/dL — AB (ref 65–99)
Potassium: 3.4 mmol/L — ABNORMAL LOW (ref 3.5–5.1)
SODIUM: 138 mmol/L (ref 135–145)

## 2017-01-01 MED ORDER — AMLODIPINE BESYLATE 5 MG PO TABS
5.0000 mg | ORAL_TABLET | Freq: Every day | ORAL | Status: DC
  Administered 2017-01-01: 5 mg via ORAL
  Filled 2017-01-01: qty 1

## 2017-01-01 NOTE — Progress Notes (Signed)
CARDIAC REHAB PHASE I   PRE:  Rate/Rhythm: 82 SR    BP: sitting 135/76    SaO2: 97 RA  MODE:  Ambulation: 350 ft   POST:  Rate/Rhythm: 97 SR    BP: sitting 161/73     SaO2: 97 RA   Pt continues to st she feels weak. She is able to stand independently and use rollator. She struggles with maneuvering rollator. She needed to sit x3 this walk. Sts she feels weaker than she has been feeling. Sts she thinks its the Fe supplement, that it has not agreed with her in the past. To bathroom then bed after walk. Ed completed with pt and grandson. Encouraged continued mobility and IS. Will refer to [place]. [place], ACSM 01/01/2017 1:52 PM

## 2017-01-01 NOTE — Progress Notes (Addendum)
      [address]       [place], [address]             [phone]      9 Days Post-Op Procedure(s) (LRB): CORONARY ARTERY BYPASS GRAFTING (CABG) TIMES THREE USING LEFT INTERNAL MAMMARY ARTERY AND LEFT SAPHENOUS LEG VEIN HARVESTED ENDOSCOPICALLY, (N/A) TRANSESOPHAGEAL ECHOCARDIOGRAM (TEE) (N/A)   Subjective:  Patient states she feels weak today.  She states that the anxiety medication isn't working and requesting additional medication.  She also states she feels bloated.  She moved her bowels yesterday.  Objective: Vital signs in last 24 hours: Temp:  [98.2 F (36.8 C)-99.1 F (37.3 C)] 98.9 F (37.2 C) (12/21 0333) Pulse Rate:  [78-95] 86 (12/21 0605) Cardiac Rhythm: Normal sinus rhythm (12/20 2300) Resp:  [20-30] 24 (12/21 0605) BP: (148-197)/(71-90) 169/73 (12/21 0605) SpO2:  [95 %-98 %] 96 % (12/21 0605) Weight:  [175 lb (79.4 kg)] 175 lb (79.4 kg) (12/21 0333)  Intake/Output from previous day: 12/20 0701 - 12/21 0700 In: 960 [P.O.:960] Out: 1402 [Urine:1400; Stool:2]  General appearance: alert, cooperative and no distress Heart: regular rate and rhythm Lungs: clear to auscultation bilaterally Abdomen: soft, non-tender; bowel sounds normal; no masses,  no organomegaly Extremities: LUE ecchymosis stable, trace LE edema Wound: clean and dry  Lab Results: Recent Labs    01/01/17 0402  WBC 7.5  HGB 9.5*  HCT 29.6*  PLT 375   BMET:  Recent Labs    12/30/16 0220 01/01/17 0402  NA 139 138  K 3.4* 3.4*  CL 99* 97*  CO2 31 30  GLUCOSE 104* 118*  BUN 17 13  CREATININE 0.85 0.90  CALCIUM 8.4* 8.9    PT/INR: No results for input(s): LABPROT, INR in the last 72 hours. ABG    Component Value Date/Time   PHART 7.319 (L) 12/24/2016 0715   HCO3 20.9 12/24/2016 0715   TCO2 23 12/24/2016 1633   ACIDBASEDEF 5.0 (H) 12/24/2016 0715   O2SAT 99.0 12/24/2016 0715   CBG (last 3)  Recent Labs    12/30/16 1609 12/30/16 2058 12/31/16 0608    GLUCAP 110* 118* 116*    Assessment/Plan: S/P Procedure(s) (LRB): CORONARY ARTERY BYPASS GRAFTING (CABG) TIMES THREE USING LEFT INTERNAL MAMMARY ARTERY AND LEFT SAPHENOUS LEG VEIN HARVESTED ENDOSCOPICALLY, (N/A) TRANSESOPHAGEAL ECHOCARDIOGRAM (TEE) (N/A)  1. CV- NSR, BP remains elevated- she is receiving lopressor 50 mg BID and Cozaar 100 mg daily, will start Norvasc at 5 mg daily 2. Pulm- no acute issues, off oxygen, continue IS 3. Renal- creatinine has been stable, weight is trending down, IV lasix ordered by Dr. Roxy Manns will continue 4. Anxiety- patient on home regimen of Lorazepam at her age I would not add additional agent or increase current dose, but will discuss with Dr. Roxy Manns 5. Deconditioning- patient is not very motivated, she is not ambulating much, she needs aggressive PT/OT... She has SNF placement available when stable for discharge 6. Dispo- patient stable, will start Norvasc for HTN, continue diuretics, deconditioning... Needs to ambulate, to SNF once appropriate will discuss with Dr. Roxy Manns   LOS: 10 days    Ellwood Handler 01/01/2017  I have seen and examined the patient and agree with the assessment and plan as outlined.  Tentatively plan d/c to SNF tomorrow.  Continue IV lasix today.  Supplement potassium.  Add Norvasc  Rexene Alberts, MD 01/01/2017 1:30 PM

## 2017-01-01 NOTE — Care Management Important Message (Signed)
Important Message  Patient Details  Name: [name] MRN: [id number] Date of Birth: 1930-04-06   Medicare Important Message Given:  Yes    Nathen May 01/01/2017, 9:49 AM

## 2017-01-01 NOTE — Progress Notes (Signed)
Patient had requested prayer.  Recent bypass surgery.  Had visit and then prayer for patient. Conard Novak, Chaplain   01/01/17 1500  Clinical Encounter Type  Visited With Patient  Visit Type Initial;Spiritual support  Referral From Patient  Consult/Referral To Chaplain  Spiritual Encounters  Spiritual Needs Ritual;Emotional  Stress Factors  Patient Stress Factors Health changes;Other (Comment)  Family Stress Factors None identified (anxiety-being in hospital)

## 2017-01-01 NOTE — Progress Notes (Signed)
Pt ambulated 359ft. Standby assistance with rollator. Standing breaks x4, and 2 sitting breaks. Pt c/o feeling weak and tired. Needing lots of encouragement. Tolerated fair. Pt returned to bed. Call bell and phone within reach. Will continue to monitor.

## 2017-01-02 DIAGNOSIS — I10 Essential (primary) hypertension: Secondary | ICD-10-CM | POA: Diagnosis not present

## 2017-01-02 DIAGNOSIS — I2510 Atherosclerotic heart disease of native coronary artery without angina pectoris: Secondary | ICD-10-CM | POA: Diagnosis not present

## 2017-01-02 DIAGNOSIS — F411 Generalized anxiety disorder: Secondary | ICD-10-CM | POA: Diagnosis not present

## 2017-01-02 DIAGNOSIS — I4891 Unspecified atrial fibrillation: Secondary | ICD-10-CM | POA: Diagnosis not present

## 2017-01-02 DIAGNOSIS — E876 Hypokalemia: Secondary | ICD-10-CM | POA: Diagnosis not present

## 2017-01-02 DIAGNOSIS — Z7982 Long term (current) use of aspirin: Secondary | ICD-10-CM | POA: Diagnosis not present

## 2017-01-02 DIAGNOSIS — E785 Hyperlipidemia, unspecified: Secondary | ICD-10-CM | POA: Diagnosis not present

## 2017-01-02 DIAGNOSIS — Z7902 Long term (current) use of antithrombotics/antiplatelets: Secondary | ICD-10-CM | POA: Diagnosis not present

## 2017-01-02 DIAGNOSIS — K219 Gastro-esophageal reflux disease without esophagitis: Secondary | ICD-10-CM | POA: Diagnosis not present

## 2017-01-02 DIAGNOSIS — I209 Angina pectoris, unspecified: Secondary | ICD-10-CM | POA: Diagnosis not present

## 2017-01-02 DIAGNOSIS — R531 Weakness: Secondary | ICD-10-CM | POA: Diagnosis not present

## 2017-01-02 DIAGNOSIS — Z79890 Hormone replacement therapy (postmenopausal): Secondary | ICD-10-CM | POA: Diagnosis not present

## 2017-01-02 DIAGNOSIS — E569 Vitamin deficiency, unspecified: Secondary | ICD-10-CM | POA: Diagnosis not present

## 2017-01-02 DIAGNOSIS — Z951 Presence of aortocoronary bypass graft: Secondary | ICD-10-CM | POA: Diagnosis not present

## 2017-01-02 DIAGNOSIS — F419 Anxiety disorder, unspecified: Secondary | ICD-10-CM | POA: Diagnosis not present

## 2017-01-02 DIAGNOSIS — M6281 Muscle weakness (generalized): Secondary | ICD-10-CM | POA: Diagnosis not present

## 2017-01-02 DIAGNOSIS — D649 Anemia, unspecified: Secondary | ICD-10-CM | POA: Diagnosis not present

## 2017-01-02 MED ORDER — CLOPIDOGREL BISULFATE 75 MG PO TABS: 75.0000 mg | ORAL_TABLET | Freq: Every day | ORAL | Status: DC

## 2017-01-02 MED ORDER — METOPROLOL TARTRATE 50 MG PO TABS: 50.0000 mg | ORAL_TABLET | Freq: Two times a day (BID) | ORAL | Status: DC

## 2017-01-02 MED ORDER — AMLODIPINE BESYLATE 10 MG PO TABS: 10.0000 mg | ORAL_TABLET | Freq: Every day | ORAL | Status: DC

## 2017-01-02 MED ORDER — AMLODIPINE BESYLATE 10 MG PO TABS
10.0000 mg | ORAL_TABLET | Freq: Every day | ORAL | Status: DC
  Administered 2017-01-02: 10 mg via ORAL
  Filled 2017-01-02: qty 1

## 2017-01-02 MED ORDER — LORAZEPAM 0.5 MG PO TABS: 0.5000 mg | ORAL_TABLET | Freq: Three times a day (TID) | ORAL | 0 refills | Status: AC | PRN

## 2017-01-02 MED ORDER — FUROSEMIDE 40 MG PO TABS: 40.0000 mg | ORAL_TABLET | Freq: Every day | ORAL | Status: DC

## 2017-01-02 MED ORDER — ACETAMINOPHEN 325 MG PO TABS: 650.0000 mg | ORAL_TABLET | Freq: Four times a day (QID) | ORAL | Status: AC | PRN

## 2017-01-02 MED ORDER — TRAMADOL HCL 50 MG PO TABS: 50.0000 mg | ORAL_TABLET | ORAL | 0 refills | Status: DC | PRN

## 2017-01-02 MED ORDER — LOSARTAN POTASSIUM 100 MG PO TABS
100.0000 mg | ORAL_TABLET | Freq: Every day | ORAL | Status: DC
Start: 2017-01-02 — End: 2017-02-22

## 2017-01-02 MED ORDER — POTASSIUM CHLORIDE 20 MEQ/15ML (10%) PO SOLN: 40.0000 meq | Freq: Every day | ORAL | 0 refills | Status: DC

## 2017-01-02 MED ORDER — AMIODARONE HCL 200 MG PO TABS: 200.0000 mg | ORAL_TABLET | Freq: Two times a day (BID) | ORAL | Status: DC

## 2017-01-02 MED ORDER — ATORVASTATIN CALCIUM 80 MG PO TABS: 80.0000 mg | ORAL_TABLET | Freq: Every day | ORAL | Status: DC

## 2017-01-02 NOTE — Clinical Social Work Placement (Signed)
   CLINICAL SOCIAL WORK PLACEMENT  NOTE  Date:  01/02/2017  Patient Details  Name: [name] MRN: [id number] Date of Birth: [birth year]  Clinical Social Work is seeking post-discharge placement for this patient at the [place] level of care (*CSW will initial, date and re-position this form in  chart as items are completed):  Yes   Patient/family provided with [place] Work Department's list of facilities offering this level of care within the geographic area requested by the patient (or if unable, by the patient's family).  Yes   Patient/family informed of their freedom to choose among providers that offer the needed level of care, that participate in Medicare, Medicaid or managed care program needed by the patient, have an available bed and are willing to accept the patient.  Yes   Patient/family informed of [facility]'s ownership interest in [facility] and [facility], as well as of the fact that they are under no obligation to receive care at these facilities.  PASRR submitted to EDS on 12/25/16     PASRR number received on 12/25/16     Existing PASRR number confirmed on       FL2 transmitted to all facilities in geographic area requested by pt/family on 12/25/16     FL2 transmitted to all facilities within larger geographic area on       Patient informed that his/her managed care company has contracts with or will negotiate with certain facilities, including the following:        Yes   Patient/family informed of bed offers received.  Patient chooses bed at [facility]     Physician recommends and patient chooses bed at      Patient to be transferred to [facility] on 01/02/17.  Patient to be transferred to facility by Pandora Leiter)     Patient family notified on 01/02/17 of transfer.  Name of family member notified:  Fritz Pickerel (son)     PHYSICIAN       Additional Comment:     _______________________________________________ Eileen Stanford, LCSW 01/02/2017, 8:52 AM

## 2017-01-02 NOTE — Progress Notes (Signed)
      [address]       [place] [address]             [phone]      10 Days Post-Op Procedure(s) (LRB): CORONARY ARTERY BYPASS GRAFTING (CABG) TIMES THREE USING LEFT INTERNAL MAMMARY ARTERY AND LEFT SAPHENOUS LEG VEIN HARVESTED ENDOSCOPICALLY, (N/A) TRANSESOPHAGEAL ECHOCARDIOGRAM (TEE) (N/A)   Subjective:  No new complaints.  Feeling a bit better today.  She ambulated yesterday with assistance, required a lot of encouragement.  Objective: Vital signs in last 24 hours: Temp:  [97.8 F (36.6 C)-98.5 F (36.9 C)] 98.5 F (36.9 C) (12/22 0403) Pulse Rate:  [75-100] 100 (12/21 2100) Cardiac Rhythm: Normal sinus rhythm (12/22 0700) Resp:  [17-30] 24 (12/22 0522) BP: (135-211)/(65-92) 161/84 (12/22 0522) SpO2:  [96 %-100 %] 100 % (12/22 0403) Weight:  [171 lb 1.6 oz (77.6 kg)] 171 lb 1.6 oz (77.6 kg) (12/22 0411)  Intake/Output from previous day: 12/21 0701 - 12/22 0700 In: 720 [P.O.:720] Out: 2000 [Urine:2000]  General appearance: alert, cooperative and no distress Heart: regular rate and rhythm Lungs: clear to auscultation bilaterally Abdomen: soft, non-tender; bowel sounds normal; no masses,  no organomegaly Extremities: edema trace Wound: clean and dry, ecchymosis of EVH site right leg  Lab Results: Recent Labs    01/01/17 0402  WBC 7.5  HGB 9.5*  HCT 29.6*  PLT 375   BMET:  Recent Labs    01/01/17 0402  NA 138  K 3.4*  CL 97*  CO2 30  GLUCOSE 118*  BUN 13  CREATININE 0.90  CALCIUM 8.9    PT/INR: No results for input(s): LABPROT, INR in the last 72 hours. ABG    Component Value Date/Time   PHART 7.319 (L) 12/24/2016 0715   HCO3 20.9 12/24/2016 0715   TCO2 23 12/24/2016 1633   ACIDBASEDEF 5.0 (H) 12/24/2016 0715   O2SAT 99.0 12/24/2016 0715   CBG (last 3)  Recent Labs    12/30/16 1609 12/30/16 2058 12/31/16 0608  GLUCAP 110* 118* 116*    Assessment/Plan: S/P Procedure(s) (LRB): CORONARY ARTERY BYPASS GRAFTING (CABG)  TIMES THREE USING LEFT INTERNAL MAMMARY ARTERY AND LEFT SAPHENOUS LEG VEIN HARVESTED ENDOSCOPICALLY, (N/A) TRANSESOPHAGEAL ECHOCARDIOGRAM (TEE) (N/A)  1. CV- NSR, BP remains elevated at times- continue Lopressor, Cozaar, and will increase Norvasc to 10 mg daily 2. Pulm- no acute issues, off oxygen, continue IS 3. Renal- creatinine remains stable, weight continues to trend down, will transition to oral lasix today 4. Deconditioning- patient requires a lot of encouragement to ambulate, SNF placement has been arranged, will see if they can take patient today 5. Dispo- patient stable, transition to oral lasix, increase Norvasc, to SNF today if bed available   LOS: 11 days    Ellwood Handler 01/02/2017

## 2017-01-02 NOTE — Clinical Social Work Note (Signed)
Clinical Social Worker facilitated patient discharge including contacting patient family and facility to confirm patient discharge plans.  Clinical information faxed to facility and family agreeable with plan.  Per Pt's son he will transport pt to [facility] .  RN to call [phone] for report prior to discharge. Patient will go to room 801.  Clinical Social Worker will sign off for now as social work intervention is no longer needed. Please consult [place] again if new need arises.  [place], [place]

## 2017-01-02 NOTE — Progress Notes (Signed)
Sutures  Removed from abdomen x 3 and painted with betadine.   IV site removed and Telemetry discontinued.    Discharged to [facility] with son.  Mervyn Skeeters, RN

## 2017-01-04 DIAGNOSIS — F419 Anxiety disorder, unspecified: Secondary | ICD-10-CM | POA: Diagnosis not present

## 2017-01-04 DIAGNOSIS — I2510 Atherosclerotic heart disease of native coronary artery without angina pectoris: Secondary | ICD-10-CM | POA: Diagnosis not present

## 2017-01-04 DIAGNOSIS — E785 Hyperlipidemia, unspecified: Secondary | ICD-10-CM | POA: Diagnosis not present

## 2017-01-04 DIAGNOSIS — Z951 Presence of aortocoronary bypass graft: Secondary | ICD-10-CM | POA: Diagnosis not present

## 2017-01-04 DIAGNOSIS — R531 Weakness: Secondary | ICD-10-CM | POA: Diagnosis not present

## 2017-01-04 DIAGNOSIS — I10 Essential (primary) hypertension: Secondary | ICD-10-CM | POA: Diagnosis not present

## 2017-01-08 DIAGNOSIS — Z951 Presence of aortocoronary bypass graft: Secondary | ICD-10-CM | POA: Diagnosis not present

## 2017-01-08 DIAGNOSIS — K219 Gastro-esophageal reflux disease without esophagitis: Secondary | ICD-10-CM | POA: Diagnosis not present

## 2017-01-08 DIAGNOSIS — I2510 Atherosclerotic heart disease of native coronary artery without angina pectoris: Secondary | ICD-10-CM | POA: Diagnosis not present

## 2017-01-08 DIAGNOSIS — Z7902 Long term (current) use of antithrombotics/antiplatelets: Secondary | ICD-10-CM | POA: Diagnosis not present

## 2017-01-08 DIAGNOSIS — Z7982 Long term (current) use of aspirin: Secondary | ICD-10-CM | POA: Diagnosis not present

## 2017-01-08 DIAGNOSIS — E785 Hyperlipidemia, unspecified: Secondary | ICD-10-CM | POA: Diagnosis not present

## 2017-01-08 DIAGNOSIS — F419 Anxiety disorder, unspecified: Secondary | ICD-10-CM | POA: Diagnosis not present

## 2017-01-08 DIAGNOSIS — I10 Essential (primary) hypertension: Secondary | ICD-10-CM | POA: Diagnosis not present

## 2017-01-21 DIAGNOSIS — D649 Anemia, unspecified: Secondary | ICD-10-CM | POA: Diagnosis not present

## 2017-01-21 DIAGNOSIS — K219 Gastro-esophageal reflux disease without esophagitis: Secondary | ICD-10-CM | POA: Diagnosis not present

## 2017-01-21 DIAGNOSIS — I10 Essential (primary) hypertension: Secondary | ICD-10-CM | POA: Diagnosis not present

## 2017-01-21 DIAGNOSIS — I2510 Atherosclerotic heart disease of native coronary artery without angina pectoris: Secondary | ICD-10-CM | POA: Diagnosis not present

## 2017-01-21 DIAGNOSIS — F419 Anxiety disorder, unspecified: Secondary | ICD-10-CM | POA: Diagnosis not present

## 2017-01-21 DIAGNOSIS — Z951 Presence of aortocoronary bypass graft: Secondary | ICD-10-CM | POA: Diagnosis not present

## 2017-01-21 DIAGNOSIS — Z7902 Long term (current) use of antithrombotics/antiplatelets: Secondary | ICD-10-CM | POA: Diagnosis not present

## 2017-01-21 DIAGNOSIS — E785 Hyperlipidemia, unspecified: Secondary | ICD-10-CM | POA: Diagnosis not present

## 2017-01-23 DIAGNOSIS — K219 Gastro-esophageal reflux disease without esophagitis: Secondary | ICD-10-CM | POA: Diagnosis not present

## 2017-01-23 DIAGNOSIS — Z955 Presence of coronary angioplasty implant and graft: Secondary | ICD-10-CM | POA: Diagnosis not present

## 2017-01-23 DIAGNOSIS — I2510 Atherosclerotic heart disease of native coronary artery without angina pectoris: Secondary | ICD-10-CM | POA: Diagnosis not present

## 2017-01-23 DIAGNOSIS — S31829A Unspecified open wound of left buttock, initial encounter: Secondary | ICD-10-CM | POA: Diagnosis not present

## 2017-01-23 DIAGNOSIS — L89311 Pressure ulcer of right buttock, stage 1: Secondary | ICD-10-CM | POA: Diagnosis not present

## 2017-01-23 DIAGNOSIS — Z7902 Long term (current) use of antithrombotics/antiplatelets: Secondary | ICD-10-CM | POA: Diagnosis not present

## 2017-01-23 DIAGNOSIS — E785 Hyperlipidemia, unspecified: Secondary | ICD-10-CM | POA: Diagnosis not present

## 2017-01-23 DIAGNOSIS — Z951 Presence of aortocoronary bypass graft: Secondary | ICD-10-CM | POA: Diagnosis not present

## 2017-01-23 DIAGNOSIS — S31819A Unspecified open wound of right buttock, initial encounter: Secondary | ICD-10-CM | POA: Diagnosis not present

## 2017-01-23 DIAGNOSIS — I10 Essential (primary) hypertension: Secondary | ICD-10-CM | POA: Diagnosis not present

## 2017-01-23 DIAGNOSIS — F419 Anxiety disorder, unspecified: Secondary | ICD-10-CM | POA: Diagnosis not present

## 2017-01-23 DIAGNOSIS — Z48812 Encounter for surgical aftercare following surgery on the circulatory system: Secondary | ICD-10-CM | POA: Diagnosis not present

## 2017-01-25 DIAGNOSIS — I2510 Atherosclerotic heart disease of native coronary artery without angina pectoris: Secondary | ICD-10-CM | POA: Diagnosis not present

## 2017-01-25 DIAGNOSIS — Z48812 Encounter for surgical aftercare following surgery on the circulatory system: Secondary | ICD-10-CM | POA: Diagnosis not present

## 2017-01-26 DIAGNOSIS — Z48812 Encounter for surgical aftercare following surgery on the circulatory system: Secondary | ICD-10-CM | POA: Diagnosis not present

## 2017-01-26 DIAGNOSIS — I2510 Atherosclerotic heart disease of native coronary artery without angina pectoris: Secondary | ICD-10-CM | POA: Diagnosis not present

## 2017-01-27 DIAGNOSIS — I2510 Atherosclerotic heart disease of native coronary artery without angina pectoris: Secondary | ICD-10-CM | POA: Diagnosis not present

## 2017-01-27 DIAGNOSIS — Z48812 Encounter for surgical aftercare following surgery on the circulatory system: Secondary | ICD-10-CM | POA: Diagnosis not present

## 2017-01-28 ENCOUNTER — Other Ambulatory Visit: Payer: Self-pay | Admitting: Thoracic Surgery (Cardiothoracic Vascular Surgery)

## 2017-01-28 DIAGNOSIS — Z48812 Encounter for surgical aftercare following surgery on the circulatory system: Secondary | ICD-10-CM | POA: Diagnosis not present

## 2017-01-28 DIAGNOSIS — I2510 Atherosclerotic heart disease of native coronary artery without angina pectoris: Secondary | ICD-10-CM | POA: Diagnosis not present

## 2017-01-28 DIAGNOSIS — I25110 Atherosclerotic heart disease of native coronary artery with unstable angina pectoris: Secondary | ICD-10-CM

## 2017-01-29 DIAGNOSIS — Z48812 Encounter for surgical aftercare following surgery on the circulatory system: Secondary | ICD-10-CM | POA: Diagnosis not present

## 2017-01-29 DIAGNOSIS — I2510 Atherosclerotic heart disease of native coronary artery without angina pectoris: Secondary | ICD-10-CM | POA: Diagnosis not present

## 2017-02-01 ENCOUNTER — Ambulatory Visit: Payer: Self-pay

## 2017-02-01 ENCOUNTER — Telehealth: Payer: Self-pay

## 2017-02-01 DIAGNOSIS — I2510 Atherosclerotic heart disease of native coronary artery without angina pectoris: Secondary | ICD-10-CM | POA: Diagnosis not present

## 2017-02-01 DIAGNOSIS — Z48812 Encounter for surgical aftercare following surgery on the circulatory system: Secondary | ICD-10-CM | POA: Diagnosis not present

## 2017-02-01 NOTE — Telephone Encounter (Signed)
[name] called this morning asking if she should be taking her metoprolol 50 mg two times a day instead of once like she was before her surgery.  Notes from Dr. Roxy Manns stated that she is to take 50 mg twice a day.  Patient understood.  I also asked if she was checking her blood pressure and heart rate, and she stated that she has "people" come in a check her vitals and "so far" everything has been normal.  I told her if she has any other questions to give [place] a call. She acknowledged receipt.

## 2017-02-02 DIAGNOSIS — I2510 Atherosclerotic heart disease of native coronary artery without angina pectoris: Secondary | ICD-10-CM | POA: Diagnosis not present

## 2017-02-02 DIAGNOSIS — Z48812 Encounter for surgical aftercare following surgery on the circulatory system: Secondary | ICD-10-CM | POA: Diagnosis not present

## 2017-02-03 DIAGNOSIS — Z79899 Other long term (current) drug therapy: Secondary | ICD-10-CM | POA: Diagnosis not present

## 2017-02-03 DIAGNOSIS — I10 Essential (primary) hypertension: Secondary | ICD-10-CM | POA: Diagnosis not present

## 2017-02-03 DIAGNOSIS — D649 Anemia, unspecified: Secondary | ICD-10-CM | POA: Diagnosis not present

## 2017-02-03 DIAGNOSIS — E7800 Pure hypercholesterolemia, unspecified: Secondary | ICD-10-CM | POA: Diagnosis not present

## 2017-02-03 DIAGNOSIS — F5101 Primary insomnia: Secondary | ICD-10-CM | POA: Diagnosis not present

## 2017-02-04 ENCOUNTER — Other Ambulatory Visit: Payer: Self-pay | Admitting: Cardiovascular Disease

## 2017-02-04 ENCOUNTER — Ambulatory Visit
Admission: RE | Admit: 2017-02-04 | Discharge: 2017-02-04 | Disposition: A | Discharge status: Home / Self Care | Payer: Medicare Other | Source: Ambulatory Visit | Attending: Thoracic Surgery (Cardiothoracic Vascular Surgery) | Admitting: Thoracic Surgery (Cardiothoracic Vascular Surgery)

## 2017-02-04 ENCOUNTER — Other Ambulatory Visit: Payer: Self-pay

## 2017-02-04 ENCOUNTER — Ambulatory Visit (INDEPENDENT_AMBULATORY_CARE_PROVIDER_SITE_OTHER): Payer: Self-pay | Admitting: Surgical

## 2017-02-04 VITALS — BP 144/75 | HR 70 | Resp 18 | Ht 66.5 in

## 2017-02-04 DIAGNOSIS — J90 Pleural effusion, not elsewhere classified: Secondary | ICD-10-CM | POA: Diagnosis not present

## 2017-02-04 DIAGNOSIS — Z951 Presence of aortocoronary bypass graft: Secondary | ICD-10-CM

## 2017-02-04 DIAGNOSIS — I25110 Atherosclerotic heart disease of native coronary artery with unstable angina pectoris: Secondary | ICD-10-CM

## 2017-02-04 NOTE — Progress Notes (Signed)
[address]       [place] [address]             [id number]      Dilcia R [facility] Medical Record #[id number] Date of Birth: 1930/05/16  Referring: Nelva Bush, MD Primary Care: Lajean Manes, MD Primary Cardiologist: Ida Rogue, MD   Chief Complaint:   POST OP FOLLOW UP CARDIOTHORACIC SURGERY OPERATIVE NOTE  Date of Procedure:    12/23/2016  Preoperative Diagnosis:        Critical Left Main and 3-vessel Coronary Artery Disease  Unstable Angina Pectoris  S/P Acute Non-STEMI  S/P IABP Placement  Postoperative Diagnosis:    Same  Procedure:        Coronary Artery Bypass Grafting x 3              Left Internal Mammary Artery to Distal Left Anterior Descending Coronary Artery             Saphenous Vein Graft to Posterior Descending Coronary Artery             Saphenous Vein Graft to Obtuse Marginal Branch of Left Circumflex Coronary Artery             Endoscopic Vein Harvest from Right Thigh and Lower Leg  Surgeon:        Valentina Gu. Roxy Manns, MD  Assistant:       Nicholes Rough, PA-C  Anesthesia:    Lillia Abed, MD  Operative Findings: ? Normal left ventricular systolic function ? Good quality left internal mammary artery conduit ? Fair quality saphenous vein conduit ? Fair quality target vessels for grafting   History of Present Illness: The patient is an 84 year old female status post the above described procedure.  She is seen in the office on today's date and routine postsurgical follow-up.  Following discharge she did spend a short time in a skilled nursing facility for further rehabilitation.  She is continuing to make excellent overall progress.  She does fatigue easily.  She does  some degree of orthopnea and dyspnea on exertion.  She is on daily Lasix and potassium.  She denies chest pain.  She has not had fevers or chills.  She has had no difficulties with her incisions.  She is having some difficulty with  insomnia but does nap frequently during the day.  She is having some issues related to anxiety but these seem to be stable and she is questioning whether she should be weaning herself off some of the Ativan dosing.  In general she does appear to be pleased with her overall progress.      Past Medical History:  Diagnosis Date  . Anxiety   . Atypical carcinoid lung tumor ([place])   . CAD (coronary artery disease)    a. 1986: s/p PTCA of bifurcation lesion in LAD;  b. 2001: s/p BMS to LAD & LCX;  c. 04/2006 Cath: diff CAD w/o critical narrowing, nl EF;  d. 12/2014 MV: inflat ST dep, mild basal inf ischemia, EF 55-65%-->Low risk; e. 12/2016 Cath: LM 90d, LAD 90ost, 50p, 84m, LCX 80ost, 42m, RCA 90p, 16m/100-CTO w/ L->R collats filling RPDA.  . Cancer ([place])   . Carotid arterial disease ([place])    a. 08/2015 Carotid U/S: < 39% bilat ICA stenosis.  . Carotid disease, bilateral ([employer])    a. RICA 0-08%, LICA 67-61%  . Cataracts, bilateral   . Cholelithiases   . Chronic diarrhea   .  Depression   . Gilbert's syndrome   . History of echocardiogram    a. 06/2010 Echo: EF 60-65%, mild AI/MR; b. 10/2015 Echo: EF 60-65%, no rwma, mild MR, nl RV fxn.  Marland Kitchen HTN (hypertension)   . Hyperlipidemia   . Impaired fasting blood sugar   . Keloid    mid sternum  . Macular degeneration   . Osteoarthritis   . Positional vertigo    benign  . S/P CABG x 3 12/23/2016   LIMA to LAD, SVG to OM, SVG to PDA, EVH via left thigh  . Symptoms, such as flushing, sleeplessness, headache, lack of concentration, associated with the menopause   . TIA (transient ischemic attack)      Social History   Tobacco Use  Smoking Status Never Smoker  Smokeless Tobacco Never Used    Social History   Substance and Sexual Activity  Alcohol Use No     Allergies  Allergen Reactions  . Biaxin [Clarithromycin]     depressed  . Celecoxib Other (See Comments)    Reaction: Unknown  . Ciprofloxacin   . Codeine Other (See Comments)     Reaction: Unknown  . Hydromorphone   . Lisinopril     cough  . Macrobid [employer] Macro] Other (See Comments)    Reaction: Unknown  . Oxycodone Hcl Other (See Comments)    Reaction: Unknown  . Prednisone   . Sertraline Hcl Other (See Comments)    Reaction: Unknown  . Penicillins Rash    Reaction: Unknown    Current Outpatient Medications  Medication Sig Dispense Refill  . acetaminophen (TYLENOL) 325 MG tablet Take 2 tablets (650 mg total) by mouth every 6 (six) hours as needed for mild pain or headache.    Marland Kitchen amiodarone (PACERONE) 200 MG tablet Take 1 tablet (200 mg total) by mouth 2 (two) times daily. X 7 days, then decrease to 200 mg daily    . amLODipine (NORVASC) 10 MG tablet Take 1 tablet (10 mg total) by mouth daily.    Marland Kitchen aspirin 81 MG tablet Take 81 mg by mouth daily.    Marland Kitchen atorvastatin (LIPITOR) 80 MG tablet Take 1 tablet (80 mg total) by mouth daily.    . Calcium Carbonate-Vitamin D (CALCIUM 600 + D PO) Take 1 tablet by mouth daily. 600 mg/ 400 mg    . clopidogrel (PLAVIX) 75 MG tablet Take 1 tablet (75 mg total) by mouth daily.    Marland Kitchen estrogens, conjugated, (PREMARIN) 0.3 MG tablet Take 0.3 mg by mouth daily. Take daily for 21 days then do not take for 7 days.    . furosemide (LASIX) 40 MG tablet Take 1 tablet (40 mg total) by mouth daily. 30 tablet   . LORazepam (ATIVAN) 0.5 MG tablet Take 0.5 mg by mouth every 8 (eight) hours as needed for anxiety.     Marland Kitchen LORazepam (ATIVAN) 0.5 MG tablet Take 1 tablet (0.5 mg total) by mouth every 8 (eight) hours as needed for anxiety. 30 tablet 0  . losartan (COZAAR) 100 MG tablet Take 1 tablet (100 mg total) by mouth daily.    . metoprolol succinate (TOPROL-XL) 50 MG 24 hr tablet TAKE 1 TABLET (50 MG TOTAL) BY MOUTH DAILY. TAKE WITH OR IMMEDIATELY FOLLOWING A MEAL. 90 tablet 3  . metoprolol tartrate (LOPRESSOR) 50 MG tablet Take 1 tablet (50 mg total) by mouth 2 (two) times daily.    . Multiple Vitamin (MULTIVITAMIN) tablet Take  1 tablet by mouth daily.    Marland Kitchen  nitroGLYCERIN (NITROSTAT) 0.4 MG SL tablet PLACE 1 TABLET (0.4 MG TOTAL) UNDER THE TONGUE EVERY 5 (FIVE) MINUTES AS NEEDED FOR CHEST PAIN. 25 tablet 0  . ondansetron (ZOFRAN) 4 MG tablet Take 1 tablet (4 mg total) by mouth every 6 (six) hours as needed for nausea. 20 tablet 0  . pantoprazole (PROTONIX) 40 MG tablet Take 40 mg by mouth daily.    . potassium chloride 20 MEQ/15ML (10%) SOLN Take 30 mLs (40 mEq total) by mouth daily. 450 mL 0  . traMADol (ULTRAM) 50 MG tablet Take 1-2 tablets (50-100 mg total) by mouth every 4 (four) hours as needed for moderate pain. 30 tablet 0   No current facility-administered medications for this visit.        Physical Exam: BP (!) 144/75 (BP Location: Right Arm, Patient Position: Sitting, Cuff Size: Normal)   Pulse 70   Resp 18   Ht 5' 6.5" (1.689 m)   SpO2 97% Comment: RA  BMI 27.20 kg/m   General appearance: alert, cooperative and no distress Heart: regular rate and rhythm and 2/6 systolic murmur Lungs: Mildly diminished in the bases bilaterally. Abdomen: Benign exam Extremities: Mild bilateral lower extremity edema Wound: Incisions well-healed without evidence of infection.   Diagnostic Studies & Laboratory data:     Recent Radiology Findings:   Dg Chest 2 View  Result Date: 02/04/2017 CLINICAL DATA:  Recent coronary artery bypass graft. Shortness of breath. EXAM: CHEST  2 VIEW COMPARISON:  Chest radiograph 12/29/2016 FINDINGS: Unchanged mild cardiomegaly with calcific aortic atherosclerosis. Prior median sternotomy and CABG. Small pleural effusions, left-greater-than-right, with associated atelectasis. These are unchanged. No new consolidation or pulmonary edema. No pneumothorax. IMPRESSION: Unchanged small pleural effusions and associated atelectasis. Electronically Signed   By: Ulyses Jarred M.D.   On: 02/04/2017 14:42      Recent Lab Findings: Lab Results  Component Value Date   WBC 7.5 01/01/2017    HGB 9.5 (L) 01/01/2017   HCT 29.6 (L) 01/01/2017   PLT 375 01/01/2017   GLUCOSE 118 (H) 01/01/2017   CHOL 121 12/22/2016   TRIG 129 12/22/2016   HDL 48 12/22/2016   LDLCALC 47 12/22/2016   ALT 15 12/23/2016   AST 23 12/23/2016   NA 138 01/01/2017   K 3.4 (L) 01/01/2017   CL 97 (L) 01/01/2017   CREATININE 0.90 01/01/2017   BUN 13 01/01/2017   CO2 30 01/01/2017   TSH 1.472 11/07/2015   INR 1.74 12/23/2016   HGBA1C 5.6 12/23/2016      Assessment / Plan: The patient is making good overall progress.  This discussed some sleep hygiene approaches for her insomnia.  I also told her that it would be reasonable to begin to try to wean off the to lower the risk of ongoing dependence.  We will continue current dosing of Lasix with small amount of lower extremity edema and stable small effusions.  Encouraged her to continue to increase her activities as tolerated in particular ambulation.  We will see her again in 2 months for office visit recheck.          John Giovanni, PA-C 02/04/2017 3:04 PM

## 2017-02-04 NOTE — Patient Instructions (Signed)
Instructions regarding activity progression.

## 2017-02-05 ENCOUNTER — Telehealth: Payer: Self-pay

## 2017-02-05 DIAGNOSIS — Z48812 Encounter for surgical aftercare following surgery on the circulatory system: Secondary | ICD-10-CM | POA: Diagnosis not present

## 2017-02-05 DIAGNOSIS — I2510 Atherosclerotic heart disease of native coronary artery without angina pectoris: Secondary | ICD-10-CM | POA: Diagnosis not present

## 2017-02-05 NOTE — Telephone Encounter (Signed)
Left msg for pt to call and schedule follow up appt with Dr. Rockey Situ

## 2017-02-05 NOTE — Telephone Encounter (Signed)
-----   Message from Vanessa Ralphs, RN sent at 12/30/2016  8:00 AM EST ----- Regarding: RE: follow up needed please Patient will need f/u with Dr Rockey Situ as well as Dr Roxy Manns. It looks like patient is already scheduled with Dr Roxy Manns for this Friday. I would put patient to f/u with Dr Rockey Situ as soon as he's back in the office in January.  Thanks, Anderson Malta  ----- Message ----- From: Blain Pais Sent: 12/29/2016   3:57 PM To: Vanessa Ralphs, RN Subject: FW: follow up needed please                    Can you help me with this? Should this pt f/u with dr Rockey Situ or dr Roxy Manns? ----- Message ----- From: Candis Schatz Sent: 12/29/2016  10:54 AM To: Rebeca Alert [place] Support Pool Subject: follow up needed please                          ----- Message ----- From: Elgie Collard, PA-C Sent: 12/29/2016  10:09 AM To: Candis Schatz  On 12/12 Ms. Mariane Masters underwent CABG x 3 with Dr. Roxy Manns. She will need a follow-up appointment. Thanks!   Referring: Dr. Saunders Revel

## 2017-02-08 ENCOUNTER — Telehealth: Payer: Self-pay | Admitting: Cardiovascular Disease

## 2017-02-08 DIAGNOSIS — I2510 Atherosclerotic heart disease of native coronary artery without angina pectoris: Secondary | ICD-10-CM | POA: Diagnosis not present

## 2017-02-08 DIAGNOSIS — Z48812 Encounter for surgical aftercare following surgery on the circulatory system: Secondary | ICD-10-CM | POA: Diagnosis not present

## 2017-02-08 NOTE — Telephone Encounter (Signed)
S/w patient. She wanted to make sure if she should have been taking the isosorbide or not.  At discharge from the hospital, her instructions said to stop this medication.  At appointment on 02/04/17 with Dr Girtha Rm, isosorbide was not on her list either. Advised patient she did not need to be taking isosorbide at this time. Patient aware of upcoming appointment with Dr Rockey Situ on 02/16/17.

## 2017-02-08 NOTE — Telephone Encounter (Signed)
Pt is on isosorbide  She states she just had open heart surgery and is calling to see if she needs to be on this still  Please advise

## 2017-02-09 DIAGNOSIS — I2510 Atherosclerotic heart disease of native coronary artery without angina pectoris: Secondary | ICD-10-CM | POA: Diagnosis not present

## 2017-02-09 DIAGNOSIS — Z48812 Encounter for surgical aftercare following surgery on the circulatory system: Secondary | ICD-10-CM | POA: Diagnosis not present

## 2017-02-11 ENCOUNTER — Telehealth: Payer: Self-pay

## 2017-02-11 ENCOUNTER — Telehealth: Payer: Self-pay | Admitting: Cardiovascular Disease

## 2017-02-11 DIAGNOSIS — Z48812 Encounter for surgical aftercare following surgery on the circulatory system: Secondary | ICD-10-CM | POA: Diagnosis not present

## 2017-02-11 DIAGNOSIS — I2510 Atherosclerotic heart disease of native coronary artery without angina pectoris: Secondary | ICD-10-CM | POA: Diagnosis not present

## 2017-02-11 NOTE — Telephone Encounter (Signed)
Pt scheduled 02/16/17   Nothing else needed

## 2017-02-11 NOTE — Telephone Encounter (Signed)
Don with [facility] calling in regards to the medication amiodarone  He would like a confirmation on dosage and frequency States patient was taking medication at [facility] Dr Roxy Manns had it placed on list but not 100% sure on continuing Please call Timmothy Sours to discuss or patient at [phone]

## 2017-02-11 NOTE — Telephone Encounter (Signed)
Dr. Rockey Situ, this patient has post hospital follow up with you on 02/16/17. She was discharged on 12/29/17 post CABG with Dr. Roxy Manns. She had post-op a-fib and discharged on amiodarone 200 mg BID x 7 days, then 200 mg once daily after that.  She did see Jadene Pierini, PA for surgical follow up on 02/04/17 and the amiodarone was still on her med list per his office note, but the directions still read as they did at hospital discharge so I'm not sure how well this was verified at her surgical follow up. Should she still be taking amiodarone 200 mg once daily at this point?

## 2017-02-11 NOTE — Telephone Encounter (Signed)
[name] called to ask about whether she should still be taking Amiodarone after begin discharged in December from the hospital for CABG.  She stated that she has not been taking it.  Advised her to speak with Dr. Rockey Situ about whether she should be taking/ continuing the medication.  Patient acknowledged receipt.  She has an appointment with him next week.

## 2017-02-13 NOTE — Telephone Encounter (Signed)
She can continue 1 a day until she sees me

## 2017-02-13 NOTE — Progress Notes (Signed)
I Cardiology Office Note  Date:  02/16/2017   ID:  MISTY FOUTZ, DOB 1930-02-11, MRN [id number]  PCP:  Lajean Manes, MD   Chief Complaint  Patient presents with  . Other    ED follow up 12/18 CP. Patient denies chest pain and SOB at this time. Meds reviewed verbally with patient.     HPI:  84 yo woman with  CAD,  prior PCI of the LAD in 2001 with non DES, PCI of mid LCX with non des and cutting balloon,  chronic chest pain episodes,   cardiac catheterization April 2014 showing occluded RCA, patent LAD and left circumflex and  50% carotid disease on the left.  Chronic leg weakness  hospitalization for bronchitis  hospital 10/25, d/c 11/07/2015  She presents today for follow-up of her coronary artery disease, frequent PVCs, hypertension, carotid arterial disease  Admitted to the hospital December 2018 with chest pain, non-ST elevation MI Hospital records reviewed with the patient in detail Cardiac catheterization by Dr. Saunders Revel with concern for critical left main disease, three-vessel disease, sent to [facility] for surgical consultation  Underwent CABG x3 Postop day 5 developed atrial fibrillation, given amiodarone, normal sinus rhythm restored LIMA to the LAD, vein graft to PDA, vein graft to OM Left hand hematoma  Does not want cardiac rehab Denies having any significant chest pain Going up and down stairs, has home PT  EKG personally reviewed by myself on todays visit  shows normal sinus rhythm with rate 82 bpm nonspecific ST abnormality anterolateral leads  Other past medical history reviewed Previously reported having chest tightness, rare episodes that wake her at night One episode at 4 AM, low-grade chest tightness, then she resolved on its own. Did not take nitroglycerin dramatic improvement in her symptoms on isosorbide 15 mg daily.  Long history of GI issues, diarrhea, has to take extra potassium daily Most recent potassium above 3.5 at the end of December  2016  Chronic leg weakness, does her ADLs, does not work in the garden anymore, unable to get up if she is on the ground   seen in the hospital 11/03/2013 for chest pain. Workup was negative. Stress test was offered but she declined Blood pressure was elevated in the hospital, likely exacerbated by anxiety. Losartan was increased up to 100 mg daily  Previous lab work total cholesterol 136, LDL 14, HDL 56, hemoglobin A1c 5.8, TSH 3.7 Echocardiogram 11/04/2013 was reviewed with her showing normal LV systolic function, moderately elevated right ventricular systolic pressure, otherwise normal study  echocardiogram in 2012 demonstrated well-preserved left ventricular function, and trace AI.   3 vessel [facility]CA at [facility] in [facility] and then had a stent placed several years ago  (non DES).     PMH:   has a past medical history of Anxiety, Atypical carcinoid lung tumor ([place]), CAD (coronary artery disease), Cancer ([place]), Carotid arterial disease ([place]), Carotid disease, bilateral ([place]), Cataracts, bilateral, Cholelithiases, Chronic diarrhea, Depression, Gilbert's syndrome, History of echocardiogram, HTN (hypertension), Hyperlipidemia, Impaired fasting blood sugar, Keloid, Macular degeneration, Osteoarthritis, Positional vertigo, S/P CABG x 3 (12/23/2016), Symptoms, such as flushing, sleeplessness, headache, lack of concentration, associated with the menopause, and TIA (transient ischemic attack).  PSH:    Past Surgical History:  Procedure Laterality Date  . ABDOMINAL AORTOGRAM N/A 12/22/2016   Procedure: ABDOMINAL AORTOGRAM;  Surgeon: Nelva Bush, MD;  Location: [place] CV LAB;  Service: Cardiovascular;  Laterality: N/A;  . CORONARY ARTERY BYPASS GRAFT N/A 12/23/2016   Procedure: CORONARY ARTERY BYPASS  GRAFTING (CABG) TIMES THREE USING LEFT INTERNAL MAMMARY ARTERY AND LEFT SAPHENOUS LEG VEIN HARVESTED ENDOSCOPICALLY,;  Surgeon: Rexene Alberts, MD;  Location: [place];  Service: Open Heart Surgery;   Laterality: N/A;  . IABP INSERTION N/A 12/22/2016   Procedure: IABP Insertion;  Surgeon: Nelva Bush, MD;  Location: [place] [facility];  Service: Cardiovascular;  Laterality: N/A;  . LEFT HEART CATH AND CORONARY ANGIOGRAPHY N/A 12/22/2016   Procedure: LEFT HEART CATH AND CORONARY ANGIOGRAPHY;  Surgeon: Nelva Bush, MD;  Location: [place] [facility];  Service: Cardiovascular;  Laterality: N/A;  . LEFT HEART CATHETERIZATION WITH CORONARY ANGIOGRAM N/A 04/27/2012   Procedure: LEFT HEART CATHETERIZATION WITH CORONARY ANGIOGRAM;  Surgeon: Peter M Martinique, MD;  Location: [facility] CATH LAB;  Service: Cardiovascular;  Laterality: N/A;  . LOBECTOMY Right    RLL atypical carcinoid tumor - Dr Arlyce Dice  . pci  1986  . PCI of LAD  2001   with non des lad   . pci of mid cfx     with non des and cutting balloon pci of av circumflex  . TEE WITHOUT CARDIOVERSION N/A 12/23/2016   Procedure: TRANSESOPHAGEAL ECHOCARDIOGRAM (TEE);  Surgeon: Rexene Alberts, MD;  Location: [place];  Service: Open Heart Surgery;  Laterality: N/A;    Current Outpatient Medications  Medication Sig Dispense Refill  . acetaminophen (TYLENOL) 325 MG tablet Take 2 tablets (650 mg total) by mouth every 6 (six) hours as needed for mild pain or headache. (Patient taking differently: Take 2 tablets (650 mg total) by mouth every 6 (six) hours as needed for mild pain or headache.)    . amLODipine (NORVASC) 10 MG tablet Take 1 tablet (10 mg total) by mouth daily.    Marland Kitchen aspirin 81 MG tablet Take 81 mg by mouth daily.    Marland Kitchen atorvastatin (LIPITOR) 80 MG tablet Take 1 tablet (80 mg total) by mouth daily.    . Calcium Carbonate-Vitamin D (CALCIUM 600 + D PO) Take 1 tablet by mouth daily. 600 mg/ 400 mg    . clopidogrel (PLAVIX) 75 MG tablet Take 1 tablet (75 mg total) by mouth daily.    Marland Kitchen estrogens, conjugated, (PREMARIN) 0.3 MG tablet Take 0.3 mg by mouth daily. Take daily for 21 days then do not take for 7 days.    . furosemide (LASIX) 20 MG  tablet Take 20 mg by mouth daily.    . IRON PO Take 65 mg by mouth.    Marland Kitchen LORazepam (ATIVAN) 0.5 MG tablet Take 1 tablet (0.5 mg total) by mouth every 8 (eight) hours as needed for anxiety. (Patient taking differently: Take 1 tablet (0.5 mg total) by mouth every 8 (eight) hours as needed for anxiety.) 30 tablet 0  . losartan (COZAAR) 100 MG tablet Take 1 tablet (100 mg total) by mouth daily.    . metoprolol tartrate (LOPRESSOR) 50 MG tablet Take 1 tablet (50 mg total) by mouth 2 (two) times daily.    . Multiple Vitamin (MULTIVITAMIN) tablet Take 1 tablet by mouth daily.    . nitroGLYCERIN (NITROSTAT) 0.4 MG SL tablet PLACE 1 TABLET (0.4 MG TOTAL) UNDER THE TONGUE EVERY 5 (FIVE) MINUTES AS NEEDED FOR CHEST PAIN. (Patient taking differently: PLACE 1 TABLET (0.4 MG TOTAL) UNDER THE TONGUE EVERY 5 (FIVE) MINUTES AS NEEDED FOR CHEST PAIN.) 25 tablet 0  . potassium chloride 20 MEQ/15ML (10%) SOLN Take 30 mLs (40 mEq total) by mouth daily. 450 mL 0   No current facility-administered medications  for this visit.      Allergies:   Biaxin [clarithromycin]; Celecoxib; Ciprofloxacin; Codeine; Hydromorphone; Lisinopril; Macrobid [nitrofurantoin monohyd macro]; Oxycodone hcl; Prednisone; Sertraline hcl; and Penicillins   Social History:  The patient  reports that  has never smoked. she has never used smokeless tobacco. She reports that she does not drink alcohol or use drugs.   Family History:   family history includes CAD in her father and mother; Diabetes Mellitus I in her brother, mother, sister, and sister.    Review of Systems: Review of Systems  Constitutional: Positive for malaise/fatigue.  Respiratory: Negative.   Cardiovascular: Negative.   Gastrointestinal: Negative.   Musculoskeletal: Negative.        Leg weakness  Neurological: Positive for weakness.  Psychiatric/Behavioral: Negative.   All other systems reviewed and are negative.    PHYSICAL EXAM: VS:  BP 115/69 (BP Location: Left  Arm, Patient Position: Sitting, Cuff Size: Normal)   Pulse 82   Ht 5\' 6"  (1.676 m)   Wt 166 lb 12 oz (75.6 kg)   BMI 26.91 kg/m  , BMI Body mass index is 26.91 kg/m. Constitutional:  oriented to person, place, and time. No distress.  Unsteady gait  HENT:  Head: Normocephalic and atraumatic.  Eyes:  no discharge. No scleral icterus.  Neck: Normal range of motion. Neck supple. No JVD present.  Cardiovascular: Normal rate, regular rhythm, normal heart sounds and intact distal pulses. Exam reveals no gallop and no friction rub. No edema 1+ SEM RSB well-healed mediastinal incision,  Pulmonary/Chest: Effort normal and breath sounds normal. No stridor. No respiratory distress.  no wheezes.  no rales.  no tenderness.  Abdominal: Soft.  no distension.  no tenderness.  Musculoskeletal: Normal range of motion.  no  tenderness or deformity.  Neurological:  normal muscle tone. Coordination normal. No atrophy Skin: Skin is warm and dry. No rash noted. not diaphoretic.  Psychiatric:  normal mood and affect. behavior is normal. Thought content normal.      Recent Labs: 12/23/2016: ALT 15 12/24/2016: Magnesium 2.1 01/01/2017: BUN 13; Creatinine, Ser 0.90; Hemoglobin 9.5; Platelets 375; Potassium 3.4; Sodium 138    Lipid Panel Lab Results  Component Value Date   CHOL 121 12/22/2016   HDL 48 12/22/2016   LDLCALC 47 12/22/2016   TRIG 129 12/22/2016      Wt Readings from Last 3 Encounters:  02/16/17 166 lb 12 oz (75.6 kg)  01/02/17 171 lb 1.6 oz (77.6 kg)  12/22/16 179 lb 7.3 oz (81.4 kg)       ASSESSMENT AND PLAN:   CABG Recovered well from three-vessel bypass surgery November 2018 No significant medication changes made Discussed each of her medications with her  small pleural effusion on chest x-ray 2 weeks ago  Coronary artery disease involving native coronary artery of native heart with angina pectoris with documented spasm ([place]) - Plan: EKG 12-Lead She does not want cardiac  rehab  discussed recent events as detailed above   Essential hypertension - Plan: EKG 12-Lead Blood pressure running low end of normal Recommended she monitor blood pressure and if this continues to run low she could cut amlodipine or losartan in half daily  PVC's (premature ventricular contractions) No PVCs on today's visit  Denies any ectopy symptoms  Left carotid stenosis Less than 39% blockage in the carotids  Weakness of lower extremity, unspecified laterality Recommended regular exercise program She does not want cardiac rehab   Total encounter time more than 25 minutes  Greater  than 50% was spent in counseling and coordination of care with the patient   Disposition:   F/U  6 months   Orders Placed This Encounter  Procedures  . EKG 12-Lead     Signed, Esmond Plants, M.D., Ph.D. 02/16/2017  [place], [place]

## 2017-02-15 NOTE — Telephone Encounter (Signed)
No answer to [facility] with Home health. Left message to call back.   S/w patient. When she was [facility] she is not sure if she was taking the amiodarone. She left [facility] about 2 weeks ago and is back at home now.  She does not have the amiodarone and has not taken it for 2 weeks. Patient would like to wait to see Dr Rockey Situ tomorrow, 02/16/17, at her appointment to discuss with him. Advised patient I will let Dr Rockey Situ know and call her back if needed.

## 2017-02-15 NOTE — Telephone Encounter (Signed)
Attempted to reach [facility] with [place].  No answer. He has secure voicemail.  Left message letting him know I did speak with patient and he did not need to call me back unless needed.

## 2017-02-16 ENCOUNTER — Encounter: Payer: Self-pay | Admitting: Cardiovascular Disease

## 2017-02-16 ENCOUNTER — Ambulatory Visit (INDEPENDENT_AMBULATORY_CARE_PROVIDER_SITE_OTHER): Payer: Medicare Other | Admitting: Cardiovascular Disease

## 2017-02-16 VITALS — BP 115/69 | HR 82 | Ht 66.0 in | Wt 166.8 lb

## 2017-02-16 DIAGNOSIS — R29898 Other symptoms and signs involving the musculoskeletal system: Secondary | ICD-10-CM | POA: Diagnosis not present

## 2017-02-16 DIAGNOSIS — I25118 Atherosclerotic heart disease of native coronary artery with other forms of angina pectoris: Secondary | ICD-10-CM | POA: Diagnosis not present

## 2017-02-16 DIAGNOSIS — Z951 Presence of aortocoronary bypass graft: Secondary | ICD-10-CM

## 2017-02-16 DIAGNOSIS — I209 Angina pectoris, unspecified: Secondary | ICD-10-CM | POA: Diagnosis not present

## 2017-02-16 DIAGNOSIS — E7800 Pure hypercholesterolemia, unspecified: Secondary | ICD-10-CM

## 2017-02-16 DIAGNOSIS — I6522 Occlusion and stenosis of left carotid artery: Secondary | ICD-10-CM | POA: Diagnosis not present

## 2017-02-16 DIAGNOSIS — I10 Essential (primary) hypertension: Secondary | ICD-10-CM | POA: Diagnosis not present

## 2017-02-16 DIAGNOSIS — R0789 Other chest pain: Secondary | ICD-10-CM | POA: Diagnosis not present

## 2017-02-16 NOTE — Patient Instructions (Signed)
Medication Instructions:    Please monitor blood pressure If it runs low, Cut the amlodipine or losartan in 1/2 daily  For constipation, Try mom Try 1/2 cap full of miralex (generic) every other day to start Can be taken every day  Labwork:  No new labs needed  Testing/Procedures:  No further testing at this time   Follow-Up: It was a pleasure seeing you in the office today. Please call us if you have new issues that need to be addressed before your next appt.  [phone]  Your physician wants you to follow-up in: 6 months.  You will receive a reminder letter in the mail two months in advance. If you don't receive a letter, please call our office to schedule the follow-up appointment.  If you need a refill on your cardiac medications before your next appointment, please call your pharmacy.

## 2017-02-17 DIAGNOSIS — Z48812 Encounter for surgical aftercare following surgery on the circulatory system: Secondary | ICD-10-CM | POA: Diagnosis not present

## 2017-02-17 DIAGNOSIS — I2510 Atherosclerotic heart disease of native coronary artery without angina pectoris: Secondary | ICD-10-CM | POA: Diagnosis not present

## 2017-02-22 ENCOUNTER — Telehealth: Payer: Self-pay | Admitting: Cardiovascular Disease

## 2017-02-22 MED ORDER — LOSARTAN POTASSIUM 100 MG PO TABS: 50.0000 mg | ORAL_TABLET | Freq: Every day | ORAL | Status: DC

## 2017-02-22 NOTE — Telephone Encounter (Signed)
I called and spoke with the patient. She states her BP was running as low as 101/54. She cut the dose of losartan in 1/2 starting on 02/19/17. She is currently taking losartan 100 mg- 1/2 tablet (50 mg) once daily. She reports BP readings of 131/59, 403/70, & a systolic reading of 964 as her highest since cutting the losartan in half. I have advised her to continue to take the 1/2 tablet of losartan and monitor her BP reading.  She is aware of the SBP starts to consistently run in the mid 140's or higher to call us back. She voices understanding.  To Dr. Rockey Situ as an Juluis Rainier.

## 2017-02-22 NOTE — Telephone Encounter (Signed)
Pt calling stating her BP was 101-103/54-57 (around this range she states) Pt was advised to cut losartan in half if it was low like this  She cut it in half and it is looking a lot better.   She is wondering if she needs to stay on the half or go back to the full pill.  132/62 is what it is looking like now  Please call back with advise

## 2017-02-23 DIAGNOSIS — Z48812 Encounter for surgical aftercare following surgery on the circulatory system: Secondary | ICD-10-CM | POA: Diagnosis not present

## 2017-02-23 DIAGNOSIS — I2510 Atherosclerotic heart disease of native coronary artery without angina pectoris: Secondary | ICD-10-CM | POA: Diagnosis not present

## 2017-02-24 DIAGNOSIS — Z48812 Encounter for surgical aftercare following surgery on the circulatory system: Secondary | ICD-10-CM | POA: Diagnosis not present

## 2017-02-24 DIAGNOSIS — I2510 Atherosclerotic heart disease of native coronary artery without angina pectoris: Secondary | ICD-10-CM | POA: Diagnosis not present

## 2017-03-03 DIAGNOSIS — Z48812 Encounter for surgical aftercare following surgery on the circulatory system: Secondary | ICD-10-CM | POA: Diagnosis not present

## 2017-03-03 DIAGNOSIS — I2510 Atherosclerotic heart disease of native coronary artery without angina pectoris: Secondary | ICD-10-CM | POA: Diagnosis not present

## 2017-03-17 DIAGNOSIS — R202 Paresthesia of skin: Secondary | ICD-10-CM | POA: Diagnosis not present

## 2017-03-17 DIAGNOSIS — I10 Essential (primary) hypertension: Secondary | ICD-10-CM | POA: Diagnosis not present

## 2017-03-17 DIAGNOSIS — S60222A Contusion of left hand, initial encounter: Secondary | ICD-10-CM | POA: Diagnosis not present

## 2017-03-17 DIAGNOSIS — M25512 Pain in left shoulder: Secondary | ICD-10-CM | POA: Diagnosis not present

## 2017-03-17 DIAGNOSIS — R05 Cough: Secondary | ICD-10-CM | POA: Diagnosis not present

## 2017-03-17 DIAGNOSIS — T148XXA Other injury of unspecified body region, initial encounter: Secondary | ICD-10-CM | POA: Diagnosis not present

## 2017-04-02 ENCOUNTER — Other Ambulatory Visit: Payer: Self-pay | Admitting: Thoracic Surgery (Cardiothoracic Vascular Surgery)

## 2017-04-02 DIAGNOSIS — Z951 Presence of aortocoronary bypass graft: Secondary | ICD-10-CM

## 2017-04-05 ENCOUNTER — Encounter: Payer: Self-pay | Admitting: Thoracic Surgery (Cardiothoracic Vascular Surgery)

## 2017-04-05 ENCOUNTER — Other Ambulatory Visit: Payer: Self-pay

## 2017-04-05 ENCOUNTER — Ambulatory Visit
Admission: RE | Admit: 2017-04-05 | Discharge: 2017-04-05 | Disposition: A | Discharge status: Home / Self Care | Payer: Medicare Other | Source: Ambulatory Visit | Attending: Thoracic Surgery (Cardiothoracic Vascular Surgery) | Admitting: Thoracic Surgery (Cardiothoracic Vascular Surgery)

## 2017-04-05 ENCOUNTER — Ambulatory Visit (INDEPENDENT_AMBULATORY_CARE_PROVIDER_SITE_OTHER): Payer: Medicare Other | Admitting: Thoracic Surgery (Cardiothoracic Vascular Surgery)

## 2017-04-05 VITALS — BP 130/69 | HR 65 | Resp 16 | Ht 66.5 in | Wt 173.0 lb

## 2017-04-05 DIAGNOSIS — I6522 Occlusion and stenosis of left carotid artery: Secondary | ICD-10-CM | POA: Diagnosis not present

## 2017-04-05 DIAGNOSIS — Z951 Presence of aortocoronary bypass graft: Secondary | ICD-10-CM

## 2017-04-05 DIAGNOSIS — J90 Pleural effusion, not elsewhere classified: Secondary | ICD-10-CM | POA: Diagnosis not present

## 2017-04-05 NOTE — Patient Instructions (Addendum)
Continue all previous medications without any changes at this time  You may resume unrestricted physical activity without any particular limitations at this time.  Continue to monitor your weight on a regular basis and look for signs of fluid retention, such as worsening shortness of breath, persistent cough, and/or swelling in your lower legs.  Discuss whether or not you should adjust your fluid pill (diuretic) dose with your cardiologist.  Make every effort to stay physically active, get some type of exercise on a regular basis, and stick to a "heart healthy diet".  The long term benefits for regular exercise and a healthy diet are critically important to your overall health and wellbeing.

## 2017-04-05 NOTE — Progress Notes (Signed)
[address]       [place] [address]             [phone]     CARDIOTHORACIC SURGERY OFFICE NOTE  Referring Provider is End, Harrell Gave, MD  Primary Cardiologist is Rockey Situ, Kathlene November, MD PCP is Lajean Manes, MD   HPI:  Patient is an 84 year old female with known history of coronary artery disease status post multiple previous coronary interventions in the past, hypertension, hyperlipidemia, previous TIA, chronic diarrhea, depression, anxiety, and previous right lower lobectomy for atypical carcinoid tumor of the lung who returns to the office today for routine follow-up status post emergent coronary artery bypass grafting x3 for critical left main and three-vessel coronary artery disease status post acute non-ST segment elevation myocardial infarction on December 23, 2016.  Her early postoperative recovery was notable for some postoperative atrial fibrillation and a slow return to physical activity which was expected given her advanced age and acute presentation.  She also had a large hematoma in her left hand which was secondary to an infiltrated IV at [facility] and present prior to admission at [facility].  The swelling in her hand slowly improved but remained quite significant.  She was ultimately discharged to a local skilled nursing facility for rehabilitation on the 10th postoperative day.  Since then she has done well and she was last seen here in our office on February 04, 2017 at which time she was home and making steady progress with her rehabilitation.  She was seen in follow-up by Dr. Rockey Situ on February 16, 2017 and participated in the outpatient cardiac rehab program.  She returns to our office today.  The patient states that she has continued to make good progress.  She has mild occasional discomfort across her chest.  She denies shortness of breath.  She still feels a little bit weak and wobbly but she is ambulating without physical  assistance in getting around reasonably well.  She drove herself to her office visit today from [place].  She denies shortness of breath.  She reports some intermittent mild lower extremity edema.  She reports occasional intermittent "tickly" nonproductive cough.  Appetite is good.  Strength continues to gradually improve.  She states that she still has a firm mass on the dorsum of her left hand.  She states that the swelling and discoloration from the previous large hematoma essentially resolved with exception of this firm mass which has not changed in size much for the past 4-6 weeks.  She states that it is not tender and it does not really bother her much.  She has not had fevers or chills.     Current Outpatient Medications  Medication Sig Dispense Refill  . acetaminophen (TYLENOL) 325 MG tablet Take 2 tablets (650 mg total) by mouth every 6 (six) hours as needed for mild pain or headache. (Patient taking differently: Take 2 tablets (650 mg total) by mouth every 6 (six) hours as needed for mild pain or headache.)    . amLODipine (NORVASC) 10 MG tablet Take 1 tablet (10 mg total) by mouth daily.    Marland Kitchen aspirin 81 MG tablet Take 81 mg by mouth daily.    Marland Kitchen atorvastatin (LIPITOR) 80 MG tablet Take 1 tablet (80 mg total) by mouth daily.    . Calcium Carbonate-Vitamin D (CALCIUM 600 + D PO) Take 1 tablet by mouth daily. 600 mg/ 400 mg    . clopidogrel (PLAVIX) 75 MG  tablet Take 1 tablet (75 mg total) by mouth daily.    Marland Kitchen estrogens, conjugated, (PREMARIN) 0.3 MG tablet Take 0.3 mg by mouth daily. Take daily for 21 days then do not take for 7 days.    . furosemide (LASIX) 20 MG tablet Take 20 mg by mouth daily.    . IRON PO Take 65 mg by mouth.    Marland Kitchen LORazepam (ATIVAN) 0.5 MG tablet Take 1 tablet (0.5 mg total) by mouth every 8 (eight) hours as needed for anxiety. (Patient taking differently: Take 1 tablet (0.5 mg total) by mouth every 8 (eight) hours as needed for anxiety.) 30 tablet 0  . losartan  (COZAAR) 100 MG tablet Take 0.5 tablets (50 mg total) by mouth daily.    . metoprolol tartrate (LOPRESSOR) 50 MG tablet Take 1 tablet (50 mg total) by mouth 2 (two) times daily. (Patient taking differently: Take 50 mg by mouth daily. )    . Multiple Vitamin (MULTIVITAMIN) tablet Take 1 tablet by mouth daily.    . nitroGLYCERIN (NITROSTAT) 0.4 MG SL tablet PLACE 1 TABLET (0.4 MG TOTAL) UNDER THE TONGUE EVERY 5 (FIVE) MINUTES AS NEEDED FOR CHEST PAIN. (Patient taking differently: PLACE 1 TABLET (0.4 MG TOTAL) UNDER THE TONGUE EVERY 5 (FIVE) MINUTES AS NEEDED FOR CHEST PAIN.) 25 tablet 0  . potassium chloride SA (K-DUR,KLOR-CON) 20 MEQ tablet Take 1 tablet (20 mEq total) by mouth 2 (two) times daily. 180 tablet 3   No current facility-administered medications for this visit.       Physical Exam:   BP 130/69 (BP Location: Right Arm, Patient Position: Sitting, Cuff Size: Large)   Pulse 65   Resp 16   Ht 5' 6.5" (1.689 m)   Wt 173 lb (78.5 kg)   SpO2 98% Comment: ON RA  BMI 27.50 kg/m   General:  Elderly but well-appearing  Chest:   Clear to auscultation with symmetric breath sounds  CV:   Regular rate and rhythm without murmur  Incisions:  Sternotomy scar healing nicely, sternum is stable  Abdomen:  Soft nontender  Extremities:  Warm and well-perfused, mild bilateral lower extremity edema.  Patient has a 2 cm firm mass on the dorsum of her left hand with some fluctuance on palpation suggestive of a persistent cyst.  It is difficult to tell whether or not this might communicate with underlying joint space but presumably does not.  There is no surrounding erythema or other signs of infection.   Diagnostic Tests:  CHEST - 2 VIEW  COMPARISON:  Chest radiograph 02/04/2017  FINDINGS: Patient status post median sternotomy. Stable cardiac and mediastinal contours. Aortic atherosclerosis. Persistent small bilateral pleural effusions. No large area pulmonary consolidation. No pleural  effusion or pneumothorax.  IMPRESSION: Small bilateral pleural effusions.  No acute process.   Electronically Signed   By: Lovey Newcomer M.D.   On: 04/05/2017 12:16   Impression:  Patient is doing well from a cardiac standpoint approximately 3 months status post emergency coronary artery bypass grafting for critical left main and three-vessel coronary artery disease, status post acute non-ST segment elevation myocardial infarction.  Prior to surgery she had severe hematoma secondary to an infiltrated IV on the dorsum of her left hand.  The hematoma has resolved but she has been left with a cystic mass which is quite firm but somewhat fluctuant.  This might need surgical intervention.  Plan:  We will refer the patient for consultation from Dr. Roseanne Kaufman, a well-known hand surgeon locally  in [place].  We have not recommended any changes to the patient's current medications.  I have encouraged the patient to continue to gradually increase her physical activity without any particular limitation.  I reminded the patient that she should check her weight on a regular basis and look for signs of fluid overload, such as increased shortness of breath, worsening nonproductive cough, or lower extremity swelling.  She should discuss with Dr. Rockey Situ whether or not to adjust her dose of Lasix periodically.  The patient will continue to follow-up regularly with Dr. Rockey Situ and return to our office next December for routine follow-up approximately 1 year after her surgery.  She will call and return sooner should specific problems or questions arise.   I spent in excess of 15 minutes during the conduct of this office consultation and >50% of this time involved direct face-to-face encounter with the patient for counseling and/or coordination of their care.   Valentina Gu. Roxy Manns, MD 04/05/2017 12:38 PM

## 2017-04-19 DIAGNOSIS — H353212 Exudative age-related macular degeneration, right eye, with inactive choroidal neovascularization: Secondary | ICD-10-CM | POA: Diagnosis not present

## 2017-04-19 DIAGNOSIS — H353122 Nonexudative age-related macular degeneration, left eye, intermediate dry stage: Secondary | ICD-10-CM | POA: Diagnosis not present

## 2017-05-19 DIAGNOSIS — R2232 Localized swelling, mass and lump, left upper limb: Secondary | ICD-10-CM | POA: Diagnosis not present

## 2017-05-19 DIAGNOSIS — R2230 Localized swelling, mass and lump, unspecified upper limb: Secondary | ICD-10-CM | POA: Insufficient documentation

## 2017-05-26 DIAGNOSIS — F3340 Major depressive disorder, recurrent, in remission, unspecified: Secondary | ICD-10-CM | POA: Diagnosis not present

## 2017-05-26 DIAGNOSIS — Z1389 Encounter for screening for other disorder: Secondary | ICD-10-CM | POA: Diagnosis not present

## 2017-05-26 DIAGNOSIS — J029 Acute pharyngitis, unspecified: Secondary | ICD-10-CM | POA: Diagnosis not present

## 2017-05-26 DIAGNOSIS — I872 Venous insufficiency (chronic) (peripheral): Secondary | ICD-10-CM | POA: Diagnosis not present

## 2017-05-26 DIAGNOSIS — D509 Iron deficiency anemia, unspecified: Secondary | ICD-10-CM | POA: Diagnosis not present

## 2017-05-26 DIAGNOSIS — I10 Essential (primary) hypertension: Secondary | ICD-10-CM | POA: Diagnosis not present

## 2017-05-26 DIAGNOSIS — Z0000 Encounter for general adult medical examination without abnormal findings: Secondary | ICD-10-CM | POA: Diagnosis not present

## 2017-05-27 DIAGNOSIS — H5310 Unspecified subjective visual disturbances: Secondary | ICD-10-CM | POA: Diagnosis not present

## 2017-05-27 DIAGNOSIS — H353212 Exudative age-related macular degeneration, right eye, with inactive choroidal neovascularization: Secondary | ICD-10-CM | POA: Diagnosis not present

## 2017-07-03 ENCOUNTER — Emergency Department: Payer: Medicare Other

## 2017-07-03 ENCOUNTER — Telehealth: Payer: Self-pay | Admitting: Cardiology

## 2017-07-03 ENCOUNTER — Encounter: Payer: Self-pay | Admitting: Emergency Medicine

## 2017-07-03 ENCOUNTER — Emergency Department
Admission: EM | Admit: 2017-07-03 | Discharge: 2017-07-03 | Disposition: A | Discharge status: Home / Self Care | Payer: Medicare Other | Attending: Emergency Medicine | Admitting: Emergency Medicine

## 2017-07-03 DIAGNOSIS — R002 Palpitations: Secondary | ICD-10-CM | POA: Diagnosis not present

## 2017-07-03 DIAGNOSIS — I119 Hypertensive heart disease without heart failure: Secondary | ICD-10-CM | POA: Insufficient documentation

## 2017-07-03 DIAGNOSIS — Z79899 Other long term (current) drug therapy: Secondary | ICD-10-CM | POA: Insufficient documentation

## 2017-07-03 DIAGNOSIS — I493 Ventricular premature depolarization: Secondary | ICD-10-CM | POA: Insufficient documentation

## 2017-07-03 DIAGNOSIS — Z7982 Long term (current) use of aspirin: Secondary | ICD-10-CM | POA: Diagnosis not present

## 2017-07-03 DIAGNOSIS — I2510 Atherosclerotic heart disease of native coronary artery without angina pectoris: Secondary | ICD-10-CM | POA: Insufficient documentation

## 2017-07-03 DIAGNOSIS — I10 Essential (primary) hypertension: Secondary | ICD-10-CM | POA: Diagnosis not present

## 2017-07-03 DIAGNOSIS — R030 Elevated blood-pressure reading, without diagnosis of hypertension: Secondary | ICD-10-CM | POA: Diagnosis present

## 2017-07-03 DIAGNOSIS — Z7902 Long term (current) use of antithrombotics/antiplatelets: Secondary | ICD-10-CM | POA: Diagnosis not present

## 2017-07-03 DIAGNOSIS — R05 Cough: Secondary | ICD-10-CM | POA: Diagnosis not present

## 2017-07-03 LAB — CBC
HEMATOCRIT: 37.1 % (ref 35.0–47.0)
Hemoglobin: 12.5 g/dL (ref 12.0–16.0)
MCH: 29.6 pg (ref 26.0–34.0)
MCHC: 33.7 g/dL (ref 32.0–36.0)
MCV: 87.7 fL (ref 80.0–100.0)
PLATELETS: 158 10*3/uL (ref 150–440)
RBC: 4.23 MIL/uL (ref 3.80–5.20)
RDW: 16 % — AB (ref 11.5–14.5)
WBC: 5.1 10*3/uL (ref 3.6–11.0)

## 2017-07-03 LAB — COMPREHENSIVE METABOLIC PANEL
ALT: 16 U/L (ref 14–54)
ANION GAP: 6 (ref 5–15)
AST: 24 U/L (ref 15–41)
Albumin: 4.1 g/dL (ref 3.5–5.0)
Alkaline Phosphatase: 97 U/L (ref 38–126)
BILIRUBIN TOTAL: 1.7 mg/dL — AB (ref 0.3–1.2)
BUN: 19 mg/dL (ref 6–20)
CHLORIDE: 105 mmol/L (ref 101–111)
CO2: 29 mmol/L (ref 22–32)
Calcium: 9.2 mg/dL (ref 8.9–10.3)
Creatinine, Ser: 0.77 mg/dL (ref 0.44–1.00)
Glucose, Bld: 141 mg/dL — ABNORMAL HIGH (ref 65–99)
POTASSIUM: 3.8 mmol/L (ref 3.5–5.1)
Sodium: 140 mmol/L (ref 135–145)
TOTAL PROTEIN: 7.4 g/dL (ref 6.5–8.1)

## 2017-07-03 LAB — TROPONIN I

## 2017-07-03 MED ORDER — SODIUM CHLORIDE 0.9 % IV BOLUS
500.0000 mL | Freq: Once | INTRAVENOUS | Status: AC
  Administered 2017-07-03: 500 mL via INTRAVENOUS

## 2017-07-03 NOTE — ED Provider Notes (Signed)
[facility] Emergency Department Provider Note  Time seen: 10:09 PM  I have reviewed the triage vital signs and the nursing notes.   HISTORY  Chief Complaint Hypertension    HPI [name] is a 84 y.o. female with a past medical history of anxiety, CAD, hypertension, hyperlipidemia, presents to the emergency department for high blood pressure.  According to the patient she checked her blood pressure tonight 3 times at home, states that blood pressure readings were around 403 systolic.  She became concerned so she called EMS.  She states initially she refused transport by EMS, but asked him to listen to her heart while they were there.  Paramedics state the patient.  Somewhat anxious, but had an irregular heartbeat on auscultation so they brought her to the emergency department for evaluation.  In route to the emergency department they noted the patient to be at times in bigeminy.  Patient denies any chest pain or trouble breathing.  Does state some generalized weakness today.  Patient's major concern was her elevated blood pressure.   Past Medical History:  Diagnosis Date  . Anxiety   . Atypical carcinoid lung tumor ([place])   . CAD (coronary artery disease)    a. 1986: s/p PTCA of bifurcation lesion in LAD;  b. 2001: s/p BMS to LAD & LCX;  c. 04/2006 Cath: diff CAD w/o critical narrowing, nl EF;  d. 12/2014 MV: inflat ST dep, mild basal inf ischemia, EF 55-65%-->Low risk; e. 12/2016 Cath: LM 90d, LAD 90ost, 50p, 87m, LCX 80ost, 8m, RCA 90p, 61m/100-CTO w/ L->R collats filling RPDA.  . Cancer ([place])   . Carotid arterial disease ([place])    a. 08/2015 Carotid U/S: < 39% bilat ICA stenosis.  . Carotid disease, bilateral ([place])    a. RICA 4-74%, LICA 25-95%  . Cataracts, bilateral   . Cholelithiases   . Chronic diarrhea   . Depression   . Gilbert's syndrome   . History of echocardiogram    a. 06/2010 Echo: EF 60-65%, mild AI/MR; b. 10/2015 Echo: EF 60-65%, no rwma, mild  MR, nl RV fxn.  Marland Kitchen HTN (hypertension)   . Hyperlipidemia   . Impaired fasting blood sugar   . Keloid    mid sternum  . Macular degeneration   . Osteoarthritis   . Positional vertigo    benign  . S/P CABG x 3 12/23/2016   LIMA to LAD, SVG to OM, SVG to PDA, EVH via left thigh  . Symptoms, such as flushing, sleeplessness, headache, lack of concentration, associated with the menopause   . TIA (transient ischemic attack)     Patient Active Problem List   Diagnosis Date Noted  . S/P CABG x 3 12/23/2016  . On intra-aortic balloon pump assist   . NSTEMI (non-ST elevated myocardial infarction) ([place]) 12/22/2016  . Weakness of lower extremity 12/09/2015  . Viral URI 11/07/2015  . Hypokalemia 11/07/2015  . Cough   . Diaphoresis   . Sore throat   . Non-ST elevation (NSTEMI) myocardial infarction ([place]) 12/29/2014  . Chest pain 12/28/2014  . Angina pectoris ([place])   . Coronary artery disease involving native coronary artery of native heart with unstable angina pectoris ([place])   . Emesis   . Gastroenteritis, acute   . PVC's (premature ventricular contractions) 04/25/2012  . Cardiac arrest ([place])   . Left carotid stenosis 03/01/2012  . Leg fatigue 03/01/2012  . Dysuria 03/30/2011  . Mitral regurgitation 03/30/2011  . HYPERCHOLESTEROLEMIA  IIA 08/06/2008  .  Carotid bruit 05/28/2008  . Essential hypertension 05/11/2008  . Coronary atherosclerosis 05/11/2008  . Angina at rest [facility]) 05/11/2008    Past Surgical History:  Procedure Laterality Date  . ABDOMINAL AORTOGRAM N/A 12/22/2016   Procedure: ABDOMINAL AORTOGRAM;  Surgeon: Nelva Bush, MD;  Location: [place] CV [facility];  Service: Cardiovascular;  Laterality: N/A;  . ABDOMINAL HYSTERECTOMY    . CHOLECYSTECTOMY    . CORONARY ARTERY BYPASS GRAFT N/A 12/23/2016   Procedure: CORONARY ARTERY BYPASS GRAFTING (CABG) TIMES THREE USING LEFT INTERNAL MAMMARY ARTERY AND LEFT SAPHENOUS LEG VEIN HARVESTED ENDOSCOPICALLY,;  Surgeon: Rexene Alberts, MD;  Location: [place];  Service: Open Heart Surgery;  Laterality: N/A;  . IABP INSERTION N/A 12/22/2016   Procedure: IABP Insertion;  Surgeon: Nelva Bush, MD;  Location: [place] [facility];  Service: Cardiovascular;  Laterality: N/A;  . LEFT HEART CATH AND CORONARY ANGIOGRAPHY N/A 12/22/2016   Procedure: LEFT HEART CATH AND CORONARY ANGIOGRAPHY;  Surgeon: Nelva Bush, MD;  Location: [place] [facility];  Service: Cardiovascular;  Laterality: N/A;  . LEFT HEART CATHETERIZATION WITH CORONARY ANGIOGRAM N/A 04/27/2012   Procedure: LEFT HEART CATHETERIZATION WITH CORONARY ANGIOGRAM;  Surgeon: Peter M Martinique, MD;  Location: [facility] CATH LAB;  Service: Cardiovascular;  Laterality: N/A;  . LOBECTOMY Right    RLL atypical carcinoid tumor - Dr Arlyce Dice  . pci  1986  . PCI of LAD  2001   with non des lad   . pci of mid cfx     with non des and cutting balloon pci of av circumflex  . TEE WITHOUT CARDIOVERSION N/A 12/23/2016   Procedure: TRANSESOPHAGEAL ECHOCARDIOGRAM (TEE);  Surgeon: Rexene Alberts, MD;  Location: [place];  Service: Open Heart Surgery;  Laterality: N/A;    Prior to Admission medications   Medication Sig Start Date End Date Taking? Authorizing Provider  acetaminophen (TYLENOL) 325 MG tablet Take 2 tablets (650 mg total) by mouth every 6 (six) hours as needed for mild pain or headache. Patient taking differently: Take 2 tablets (650 mg total) by mouth every 6 (six) hours as needed for mild pain or headache. 01/02/17   Barrett, Erin R, PA-C  amLODipine (NORVASC) 10 MG tablet Take 1 tablet (10 mg total) by mouth daily. 01/02/17   Barrett, Lodema Hong, PA-C  aspirin 81 MG tablet Take 81 mg by mouth daily.    [provider]  atorvastatin (LIPITOR) 80 MG tablet Take 1 tablet (80 mg total) by mouth daily. 01/02/17   Barrett, Erin R, PA-C  Calcium Carbonate-Vitamin D (CALCIUM 600 + D PO) Take 1 tablet by mouth daily. 600 mg/ 400 mg    [provider]  clopidogrel  (PLAVIX) 75 MG tablet Take 1 tablet (75 mg total) by mouth daily. 01/02/17   Barrett, Erin R, PA-C  estrogens, conjugated, (PREMARIN) 0.3 MG tablet Take 0.3 mg by mouth daily. Take daily for 21 days then do not take for 7 days.    [provider]  furosemide (LASIX) 20 MG tablet Take 20 mg by mouth daily.    [provider]  IRON PO Take 65 mg by mouth.    [provider]  LORazepam (ATIVAN) 0.5 MG tablet Take 1 tablet (0.5 mg total) by mouth every 8 (eight) hours as needed for anxiety. Patient taking differently: Take 1 tablet (0.5 mg total) by mouth every 8 (eight) hours as needed for anxiety. 01/02/17   Barrett, Erin R, PA-C  losartan (COZAAR) 100 MG tablet  Take 0.5 tablets (50 mg total) by mouth daily. 02/22/17   Minna Merritts, MD  metoprolol tartrate (LOPRESSOR) 50 MG tablet Take 1 tablet (50 mg total) by mouth 2 (two) times daily. Patient taking differently: Take 50 mg by mouth daily.  01/02/17   Barrett, Erin R, PA-C  Multiple Vitamin (MULTIVITAMIN) tablet Take 1 tablet by mouth daily.    [provider]  nitroGLYCERIN (NITROSTAT) 0.4 MG SL tablet PLACE 1 TABLET (0.4 MG TOTAL) UNDER THE TONGUE EVERY 5 (FIVE) MINUTES AS NEEDED FOR CHEST PAIN. Patient taking differently: PLACE 1 TABLET (0.4 MG TOTAL) UNDER THE TONGUE EVERY 5 (FIVE) MINUTES AS NEEDED FOR CHEST PAIN. 01/04/17   End, Harrell Gave, MD  potassium chloride SA (K-DUR,KLOR-CON) 20 MEQ tablet Take 1 tablet (20 mEq total) by mouth 2 (two) times daily. 02/16/17   Minna Merritts, MD    Allergies  Allergen Reactions  . Biaxin [Clarithromycin]     depressed  . Celecoxib Other (See Comments)    Reaction: Unknown  . Ciprofloxacin   . Codeine Other (See Comments)    Reaction: Unknown  . Hydromorphone   . Lisinopril     cough  . Macrobid [employer] Macro] Other (See Comments)    Reaction: Unknown  . Oxycodone Hcl Other (See Comments)    Reaction: Unknown  . Prednisone   .  Sertraline Hcl Other (See Comments)    Reaction: Unknown  . Penicillins Rash    Reaction: Unknown    Family History  Problem Relation Age of Onset  . Diabetes Mellitus I Mother   . CAD Mother   . CAD Father   . Diabetes Mellitus I Sister   . Diabetes Mellitus I Brother   . Diabetes Mellitus I Sister     Social History Social History   Tobacco Use  . Smoking status: Never Smoker  . Smokeless tobacco: Never Used  Substance Use Topics  . Alcohol use: No  . Drug use: No    Review of Systems Constitutional: Negative for fever.  Mild generalized weakness today. Eyes: Negative for visual complaints ENT: Negative for recent illness/congestion Cardiovascular: Negative for chest pain. Respiratory: Negative for shortness of breath. Gastrointestinal: Negative for abdominal pain, vomiting and diarrhea. Genitourinary: Negative for urinary compaints Musculoskeletal: Negative for musculoskeletal complaints Skin: Negative for skin complaints  Neurological: Negative for headache All other ROS negative  ____________________________________________   PHYSICAL EXAM:  VITAL SIGNS: ED Triage Vitals  Enc Vitals Group     BP 07/03/17 2133 (!) 183/57     Pulse Rate 07/03/17 2133 71     Resp 07/03/17 2133 18     Temp 07/03/17 2133 98.1 F (36.7 C)     Temp Source 07/03/17 2133 Oral     SpO2 07/03/17 2133 98 %     Weight 07/03/17 2134 175 lb (79.4 kg)     Height 07/03/17 2134 5' 6.5" (1.689 m)     Head Circumference --      Peak Flow --      Pain Score 07/03/17 2133 0     Pain Loc --      Pain Edu? --      Excl. in [place]? --     Constitutional: Alert and oriented. Well appearing and in no distress. Eyes: Normal exam ENT   Head: Normocephalic and atraumatic.   Mouth/Throat: Mucous membranes are moist. Cardiovascular: Overall regular rhythm occasional ectopic beat auscultated.  Regular rate.  2/6 systolic murmur. Respiratory: Normal respiratory effort  without tachypnea nor  retractions. Breath sounds are clear Gastrointestinal: Soft and nontender. No distention.   Musculoskeletal: Nontender with normal range of motion in all extremities.  Neurologic:  Normal speech and language. No gross focal neurologic deficits  Skin:  Skin is warm, dry and intact.  Psychiatric: Mood and affect are normal.   ____________________________________________    EKG  EKG reviewed and interpreted by myself shows a sinus rhythm at 79 bpm with a narrow QRS, normal axis, largely normal intervals besides slight PR prolongation, nonspecific ST changes.  Patient does have frequent PVCs nearly every other beat.  ____________________________________________    RADIOLOGY  X-ray shows small right pleural effusion versus thickening.  No other acute abnormality noted.  ____________________________________________   INITIAL IMPRESSION / ASSESSMENT AND PLAN / ED COURSE  Pertinent labs & imaging results that were available during my care of the patient were reviewed by me and considered in my medical decision making (see chart for details).  Patient presents to the emergency department for hypertension.  Differential would include hypertension, arrhythmia, ACS, metabolic or electrolyte abnormality.  Currently patient appears well has no complaints besides feeling a little more weak today.  Her main concern was her elevated blood pressure at home.  Currently 183/57 in the emergency department.  Patient believes she took her pressure pills today.  We will check labs including cardiac enzymes.  Patient's EKG does show frequent PVCs at times she is in bigeminy, on the cardiac monitor at other times she will have a normal sinus rhythm.  Denies any chest pain or shortness of breath at any point.  Patient's labs are reassuring including normal electrolytes, negative troponin.  Chest x-ray shows possible small pleural effusion versus thickening but no other acute abnormality.  Patient feels well in  the emergency department, no complaints or distress at this time.  Patient on the cardiac monitor right now has no PVCs whatsoever.  Patient will follow up with Dr. Rockey Situ. ____________________________________________   FINAL CLINICAL IMPRESSION(S) / ED DIAGNOSES  PVCs Hypertension    Harvest Dark, MD 07/03/17 2313

## 2017-07-03 NOTE — Telephone Encounter (Signed)
  I was asked to contact the patient's daughter. The patient's blood pressure was reportedly elevated with SBP of 214, which subsequently came down to 195. She had some headache and blurred vision but denied chest pain. She has not had significantly elevated BP recently.   She was encouraged to give the patient an extra half dose of her metoprolol. If her headache/vision do not improve and/or she has new/different symptoms or if SBP remains >180, she should call 911.

## 2017-07-03 NOTE — ED Triage Notes (Signed)
Patient brought in by ems from home for hypertension. Patient states that she checks her bp every day and that her SBP is normally in the 130's. Patient states that today it was 200. Per ems her initially blood pressure for them was 150/68. Per ems the patient had bigeminy pvc.

## 2017-07-03 NOTE — ED Notes (Signed)
Patient transported to X-ray 

## 2017-07-03 NOTE — ED Notes (Signed)
Assisted pt to ambulate to bathroom. Pt resting comfortably in bed.

## 2017-07-05 ENCOUNTER — Telehealth: Payer: Self-pay | Admitting: Cardiovascular Disease

## 2017-07-05 NOTE — Telephone Encounter (Signed)
S/w patient. She was in the ED on 07/03/17 after these reported BP's were taken.  Patient was discharged in stable condition to f/u with Dr Donivan Scull office.  Patient's BP this morning was 134/73, HR 57.  Denies chest pain, shortness of breath or swelling. She says she feels fine.  Recently her grandson has started boot camp and she is worried about him. Advised patient to continue to monitor BP/HR and if remains consistently greater than 140/90 and to work on techniques to help with her worry. She verbalized understanding and will call if any further issues arise.

## 2017-07-05 NOTE — Telephone Encounter (Signed)
Pt c/o BP issue: STAT if pt c/o blurred vision, one-sided weakness or slurred speech  1. What are your last 5 BP readings?   07/03/17  These were taken around 5 pm  173/85 175/76 190/77  She then had paramedics come, they told her she had an irregular heart and advised her to go ER   2. Are you having any other symptoms (ex. Dizziness, headache, blurred vision, passed out)? no  3. What is your BP issue?   She states on Saturday her BP was higher than normal it even got up to 200 over (she doesn't remember)  She is calling to see if we need to adjust her medication, losartan is what is she questioning  She states the only other thing that could be causing her BP to run higher could be the fact her grandson just went off into the [employer] she states she is worried about that. She states other than that she feels fine   Would like some advise on this Please call back

## 2017-07-09 DIAGNOSIS — I10 Essential (primary) hypertension: Secondary | ICD-10-CM | POA: Diagnosis not present

## 2017-07-09 DIAGNOSIS — R05 Cough: Secondary | ICD-10-CM | POA: Diagnosis not present

## 2017-08-01 NOTE — Progress Notes (Signed)
I Cardiology Office Note  Date:  08/03/2017   ID:  [name], DOB 15-Oct-1930, MRN [id number]  PCP:  Lajean Manes, MD   Chief Complaint  Patient presents with  . Other    6 month follow up. Patient was recently in the ED for Hypertension. Patient c/o SOB on exertion. Meds reviewed verbally with patient.     HPI:  84 yo woman with  CAD,  prior PCI of the LAD in 2001 with non DES, PCI of mid LCX with non des  chronic chest pain episodes,   cardiac catheterization April 2014 showing occluded RCA, patent LAD and left circumflex and  50% carotid disease on the left.  Chronic leg weakness  hospitalization for bronchitis  hospital 10/25, d/c 11/07/2015  She presents today for follow-up of her coronary artery disease, frequent PVCs, hypertension, carotid arterial disease  In follow-up today she reports that she is only taking metoprolol once a day instead of twice a day Also cut back on her losartan from 100 only taking 50 mg  Recent episode where daughter checked her blood pressure is 086 systolic She got anxious and on repeat checks it continued to rise EMTs called and did not feel that they needed to take her to the emergency room except she was having PVCs in a bigeminal pattern. She was asymptomatic Went to the emergency room 07/03/2017 for hypertension Blood pressure 761 systolic at home Called EMS She was anxious Heart rate in bigeminy, PVCs normal electrolytes, negative troponin.   Hospital records reviewed with the patient in detail  No significant chest pain No regular exercise program Problems with anxiety   EKG personally reviewed by myself on todays visit Shows normal sinus rhythm rate 61 bpm nonspecific ST abnormality in   Other past medical history reviewed  hospital December 2018 with chest pain, non-ST elevation MI Cardiac catheterization with critical left main disease, three-vessel disease Underwent CABG x3 Postop day 5 developed atrial fibrillation,  given amiodarone, normal sinus rhythm restored LIMA to the LAD, vein graft to PDA, vein graft to OM  Declined cardiac rehab  Previously reported having chest tightness, rare episodes that wake her at night One episode at 4 AM, low-grade chest tightness, then she resolved on its own. Did not take nitroglycerin dramatic improvement in her symptoms on isosorbide 15 mg daily.  Long history of GI issues, diarrhea, has to take extra potassium daily Most recent potassium above 3.5 at the end of December 2016  Chronic leg weakness, does her ADLs, does not work in the garden anymore, unable to get up if she is on the ground   seen in the hospital 11/03/2013 for chest pain. Workup was negative. Stress test was offered but she declined Blood pressure was elevated in the hospital, likely exacerbated by anxiety. Losartan was increased up to 100 mg daily  Previous lab work total cholesterol 136, LDL 14, HDL 56, hemoglobin A1c 5.8, TSH 3.7 Echocardiogram 11/04/2013 was reviewed with her showing normal LV systolic function, moderately elevated right ventricular systolic pressure, otherwise normal study  echocardiogram in 2012 demonstrated well-preserved left ventricular function, and trace AI.   3 vessel PTCA at [facility] in 1986 and then had a stent placed several years ago  (non DES).     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  PSH:    Past  Surgical History:  Procedure Laterality Date  . ABDOMINAL AORTOGRAM N/A 12/22/2016   Procedure: ABDOMINAL AORTOGRAM;  Surgeon: Nelva Bush, MD;  Location: [facility];  Service: Cardiovascular;  Laterality: N/A;  . ABDOMINAL HYSTERECTOMY    . CHOLECYSTECTOMY    . CORONARY ARTERY BYPASS GRAFT N/A 12/23/2016   Procedure: CORONARY ARTERY BYPASS GRAFTING (CABG) TIMES THREE USING LEFT INTERNAL MAMMARY ARTERY AND LEFT SAPHENOUS LEG VEIN HARVESTED ENDOSCOPICALLY,;  Surgeon: Rexene Alberts, MD;  Location: [facility];  Service: Open Heart Surgery;  Laterality: N/A;  . IABP INSERTION N/A 12/22/2016   Procedure: IABP Insertion;  Surgeon: Nelva Bush, MD;  Location: [facility];  Service: Cardiovascular;  Laterality: N/A;  . LEFT HEART CATH AND CORONARY ANGIOGRAPHY N/A 12/22/2016   Procedure: LEFT HEART CATH AND CORONARY ANGIOGRAPHY;  Surgeon: Nelva Bush, MD;  Location: [facility];  Service: Cardiovascular;  Laterality: N/A;  . LEFT HEART CATHETERIZATION WITH CORONARY ANGIOGRAM N/A 04/27/2012   Procedure: LEFT HEART CATHETERIZATION WITH CORONARY ANGIOGRAM;  Surgeon: Peter M Martinique, MD;  Location: [facility];  Service: Cardiovascular;  Laterality: N/A;  . LOBECTOMY Right    RLL atypical carcinoid tumor - Dr Arlyce Dice  . pci  1986  . PCI of LAD  2001   with non des lad   . pci of mid cfx     with non des and cutting balloon pci of av circumflex  . TEE WITHOUT CARDIOVERSION N/A 12/23/2016   Procedure: TRANSESOPHAGEAL ECHOCARDIOGRAM (TEE);  Surgeon: Rexene Alberts, MD;  Location: [facility];  Service: Open Heart Surgery;  Laterality: N/A;    Current Outpatient Medications  Medication Sig Dispense Refill  . acetaminophen (TYLENOL) 325 MG tablet Take 2 tablets (650 mg total) by mouth every 6 (six) hours as needed for mild pain or headache. (Patient taking differently: Take 2 tablets (650 mg total) by mouth every 6 (six) hours as needed for mild pain or headache.)    .  amLODipine (NORVASC) 10 MG tablet Take 1 tablet (10 mg total) by mouth daily.    Marland Kitchen aspirin 81 MG tablet Take 81 mg by mouth daily.    Marland Kitchen atorvastatin (LIPITOR) 80 MG tablet Take 1 tablet (80 mg total) by mouth daily.    . Calcium Carbonate-Vitamin D (CALCIUM 600 + D PO) Take 1 tablet by mouth daily. 600 mg/ 400 mg    . clopidogrel (PLAVIX) 75 MG tablet Take 1 tablet (75 mg total) by mouth daily.    Marland Kitchen estrogens, conjugated, (PREMARIN) 0.3 MG tablet Take 0.3 mg by mouth daily. Take daily for 21 days then do not take for 7 days.    . furosemide (LASIX) 20 MG tablet Take 20 mg by mouth daily.    . IRON PO Take 65 mg by mouth.    Marland Kitchen LORazepam (ATIVAN) 0.5 MG tablet Take 1 tablet (0.5 mg total) by mouth every 8 (eight) hours as needed for anxiety. (Patient taking differently: Take 1 tablet (0.5 mg total) by mouth every 8 (eight) hours as needed for anxiety.) 30 tablet 0  . losartan (COZAAR) 100 MG tablet Take 0.5 tablets (50 mg total) by mouth daily.    . metoprolol succinate (TOPROL-XL) 50 MG 24 hr tablet  TAKE 1 TABLET (50 MG TOTAL) BY MOUTH DAILY. TAKE WITH OR IMMEDIATELY FOLLOWING A MEAL.  3  . Multiple Vitamin (MULTIVITAMIN) tablet Take 1 tablet by mouth daily.    . nitroGLYCERIN (NITROSTAT) 0.4 MG SL tablet PLACE 1 TABLET (0.4 MG TOTAL) UNDER THE TONGUE EVERY 5 (FIVE) MINUTES AS NEEDED FOR CHEST PAIN. (Patient taking differently: PLACE 1 TABLET (0.4 MG TOTAL) UNDER THE TONGUE EVERY 5 (FIVE) MINUTES AS NEEDED FOR CHEST PAIN.) 25 tablet 0  . potassium chloride SA (K-DUR,KLOR-CON) 20 MEQ tablet Take 1 tablet (20 mEq total) by mouth 2 (two) times daily. 180 tablet 3   No current facility-administered medications for this visit.      Allergies:   Biaxin [clarithromycin]; Celecoxib; Ciprofloxacin; Codeine; Hydromorphone; Lisinopril; Macrobid [nitrofurantoin monohyd macro]; Oxycodone hcl; Prednisone; Sertraline hcl; and Penicillins   Social History:  The patient  reports that she has never smoked. She has  never used smokeless tobacco. She reports that she does not drink alcohol or use drugs.   Family History:   family history includes CAD in her father and mother; Diabetes Mellitus I in her brother, mother, sister, and sister.    Review of Systems: Review of Systems  Constitutional: Negative.   Respiratory: Negative.   Cardiovascular: Negative.   Gastrointestinal: Negative.   Musculoskeletal: Negative.        Leg weakness  Neurological: Negative.   Psychiatric/Behavioral: The patient is nervous/anxious.   All other systems reviewed and are negative.    PHYSICAL EXAM: VS:  BP (!) 149/79 (BP Location: Left Arm, Patient Position: Sitting, Cuff Size: Normal)   Pulse 61   Ht 5' 6.5" (1.689 m)   Wt 174 lb (78.9 kg)   BMI 27.66 kg/m  , BMI Body mass index is 27.66 kg/m.  No significant change in exam Constitutional:  oriented to person, place, and time. No distress.   HENT:  Head: Normocephalic and atraumatic.  Eyes:  no discharge. No scleral icterus.  Neck: Normal range of motion. Neck supple. No JVD present.  Cardiovascular: Normal rate, regular rhythm, normal heart sounds and intact distal pulses. Exam reveals no gallop and no friction rub. No edema 1+ SEM RSB well-healed mediastinal incision,  Pulmonary/Chest: Effort normal and breath sounds normal. No stridor. No respiratory distress.  no wheezes.  no rales.  no tenderness.  Abdominal: Soft.  no distension.  no tenderness.  Musculoskeletal: Normal range of motion.  no  tenderness or deformity.  Neurological:  normal muscle tone. Coordination normal. No atrophy Skin: Skin is warm and dry. No rash noted. not diaphoretic.  Psychiatric:  normal mood and affect. behavior is normal. Thought content normal.    Recent Labs: 12/24/2016: Magnesium 2.1 07/03/2017: ALT 16; BUN 19; Creatinine, Ser 0.77; Hemoglobin 12.5; Platelets 158; Potassium 3.8; Sodium 140    Lipid Panel Lab Results  Component Value Date   CHOL 121 12/22/2016    HDL 48 12/22/2016   LDLCALC 47 12/22/2016   TRIG 129 12/22/2016      Wt Readings from Last 3 Encounters:  08/03/17 174 lb (78.9 kg)  07/03/17 175 lb (79.4 kg)  04/05/17 173 lb (78.5 kg)       ASSESSMENT AND PLAN:   CABG Recovered well from three-vessel bypass surgery November 2018 Continue aspirin Plavix statin Encouraged her to take metoprolol twice a day  Coronary artery disease involving native coronary artery of native heart with angina pectoris with documented spasm [facility]) -  Previously declined cardiac rehabilitation Denies any symptoms concerning  for angina  Essential hypertension - Plan: EKG 12-Lead Blood pressure elevated today Recently seen in the emergency room for hypertension Recommended she increase losartan back to 100 mg daily Metoprolol back to twice a day For spikes in her blood pressure recommended she take nitroglycerin For anxiety causing hypertension she could take Ativan  PVC's (premature ventricular contractions) History of asymptomatic PVCs Recently and bigeminal pattern Continue metoprolol  Left carotid stenosis Less than 39% blockage in the carotids No further workup at this time    Total encounter time more than 25 minutes  Greater than 50% was spent in counseling and coordination of care with the patient   Disposition:   F/U  12 months   Orders Placed This Encounter  Procedures  . EKG 12-Lead     Signed, Esmond Plants, M.D., Ph.D. 08/03/2017  [place], [place]

## 2017-08-03 ENCOUNTER — Encounter: Payer: Self-pay | Admitting: Cardiovascular Disease

## 2017-08-03 ENCOUNTER — Ambulatory Visit (INDEPENDENT_AMBULATORY_CARE_PROVIDER_SITE_OTHER): Payer: Medicare Other | Admitting: Cardiovascular Disease

## 2017-08-03 VITALS — BP 149/79 | HR 61 | Ht 66.5 in | Wt 174.0 lb

## 2017-08-03 DIAGNOSIS — I209 Angina pectoris, unspecified: Secondary | ICD-10-CM

## 2017-08-03 DIAGNOSIS — I6522 Occlusion and stenosis of left carotid artery: Secondary | ICD-10-CM | POA: Diagnosis not present

## 2017-08-03 DIAGNOSIS — I10 Essential (primary) hypertension: Secondary | ICD-10-CM | POA: Diagnosis not present

## 2017-08-03 DIAGNOSIS — E7800 Pure hypercholesterolemia, unspecified: Secondary | ICD-10-CM

## 2017-08-03 DIAGNOSIS — I469 Cardiac arrest, cause unspecified: Secondary | ICD-10-CM | POA: Diagnosis not present

## 2017-08-03 DIAGNOSIS — Z951 Presence of aortocoronary bypass graft: Secondary | ICD-10-CM | POA: Diagnosis not present

## 2017-08-03 DIAGNOSIS — I25110 Atherosclerotic heart disease of native coronary artery with unstable angina pectoris: Secondary | ICD-10-CM | POA: Diagnosis not present

## 2017-08-03 MED ORDER — LOSARTAN POTASSIUM 100 MG PO TABS: 50.0000 mg | ORAL_TABLET | Freq: Two times a day (BID) | ORAL | 3 refills | Status: DC

## 2017-08-03 NOTE — Patient Instructions (Addendum)
Medication Instructions:   Please increase the losartan up to 1/2 pill  twice a day, 6 and an 6 pm  For very high pressure take NTG as needed up to 3 pills one every 15 min  Labwork:  No new labs needed  Testing/Procedures:  No further testing at this time   Follow-Up: It was a pleasure seeing you in the office today. Please call us if you have new issues that need to be addressed before your next appt.  [phone]  Your physician wants you to follow-up in: 12 months.  You will receive a reminder letter in the mail two months in advance. If you don't receive a letter, please call our office to schedule the follow-up appointment.  If you need a refill on your cardiac medications before your next appointment, please call your pharmacy.  For educational health videos Log in to : [email or web address] Or : [email or web address], password : [email or web address]

## 2017-10-05 ENCOUNTER — Telehealth: Payer: Self-pay | Admitting: Cardiovascular Disease

## 2017-10-05 NOTE — Telephone Encounter (Signed)
Error

## 2017-10-18 DIAGNOSIS — H353122 Nonexudative age-related macular degeneration, left eye, intermediate dry stage: Secondary | ICD-10-CM | POA: Diagnosis not present

## 2017-10-18 DIAGNOSIS — H353212 Exudative age-related macular degeneration, right eye, with inactive choroidal neovascularization: Secondary | ICD-10-CM | POA: Diagnosis not present

## 2017-10-26 ENCOUNTER — Telehealth: Payer: Self-pay | Admitting: Cardiovascular Disease

## 2017-10-26 NOTE — Telephone Encounter (Signed)
1. What dental office are you calling from? [facility]   2. What is your office phone number? [phone]  3. What is your fax number? [phone]  4. What type of procedure is the patient having performed? Root canal and bridge    5. What date is procedure scheduled or is the patient there now? Thursday, 10/17  6. What is your question (ex. Antibiotics prior to procedure, holding medication-we need to know how long dentist wants pt to hold med)? Any antibiotics prior to procedure.

## 2017-10-26 NOTE — Telephone Encounter (Signed)
No premedication needed Would stay on asa and plavix Could hold plavix the morning of the procedure

## 2017-10-26 NOTE — Telephone Encounter (Signed)
Tried calling dental office but there was no answer. Will route to provider for review.

## 2017-10-27 NOTE — Telephone Encounter (Signed)
Clearance routed through Epic to fax number listed.

## 2017-11-01 ENCOUNTER — Other Ambulatory Visit: Payer: Self-pay | Admitting: Cardiovascular Disease

## 2017-11-08 ENCOUNTER — Other Ambulatory Visit: Payer: Self-pay | Admitting: Orthopedic Surgery

## 2017-11-08 DIAGNOSIS — M25551 Pain in right hip: Secondary | ICD-10-CM | POA: Diagnosis not present

## 2017-11-08 DIAGNOSIS — Z23 Encounter for immunization: Secondary | ICD-10-CM | POA: Diagnosis not present

## 2017-11-08 DIAGNOSIS — M8438XA Stress fracture, other site, initial encounter for fracture: Secondary | ICD-10-CM

## 2017-11-08 DIAGNOSIS — M545 Low back pain: Secondary | ICD-10-CM | POA: Diagnosis not present

## 2017-11-08 DIAGNOSIS — I10 Essential (primary) hypertension: Secondary | ICD-10-CM | POA: Diagnosis not present

## 2017-11-11 ENCOUNTER — Ambulatory Visit
Admission: RE | Admit: 2017-11-11 | Discharge: 2017-11-11 | Disposition: A | Discharge status: Home / Self Care | Payer: Medicare Other | Source: Ambulatory Visit | Attending: Orthopedic Surgery | Admitting: Orthopedic Surgery

## 2017-11-11 DIAGNOSIS — M25551 Pain in right hip: Secondary | ICD-10-CM | POA: Diagnosis not present

## 2017-11-11 DIAGNOSIS — M8438XA Stress fracture, other site, initial encounter for fracture: Secondary | ICD-10-CM

## 2017-12-08 ENCOUNTER — Emergency Department: Payer: Medicare Other

## 2017-12-08 ENCOUNTER — Other Ambulatory Visit: Payer: Self-pay

## 2017-12-08 ENCOUNTER — Emergency Department
Admission: EM | Admit: 2017-12-08 | Discharge: 2017-12-08 | Disposition: A | Discharge status: Other Acute Inpatient Hospital | Payer: Medicare Other | Attending: Emergency Medicine | Admitting: Emergency Medicine

## 2017-12-08 ENCOUNTER — Encounter (HOSPITAL_COMMUNITY): Payer: Self-pay | Admitting: Emergency Medicine

## 2017-12-08 ENCOUNTER — Inpatient Hospital Stay (HOSPITAL_COMMUNITY)
Admission: EM | Admit: 2017-12-08 | Discharge: 2017-12-12 | DRG: 444 | Disposition: A | Discharge status: Home Health | Payer: Medicare Other | Attending: Internal Medicine | Admitting: Internal Medicine

## 2017-12-08 DIAGNOSIS — E785 Hyperlipidemia, unspecified: Secondary | ICD-10-CM | POA: Diagnosis present

## 2017-12-08 DIAGNOSIS — I340 Nonrheumatic mitral (valve) insufficiency: Secondary | ICD-10-CM | POA: Diagnosis not present

## 2017-12-08 DIAGNOSIS — F419 Anxiety disorder, unspecified: Secondary | ICD-10-CM | POA: Diagnosis present

## 2017-12-08 DIAGNOSIS — I700 Atherosclerosis of aorta: Secondary | ICD-10-CM | POA: Diagnosis present

## 2017-12-08 DIAGNOSIS — Z955 Presence of coronary angioplasty implant and graft: Secondary | ICD-10-CM | POA: Diagnosis not present

## 2017-12-08 DIAGNOSIS — Z7902 Long term (current) use of antithrombotics/antiplatelets: Secondary | ICD-10-CM

## 2017-12-08 DIAGNOSIS — Z90710 Acquired absence of both cervix and uterus: Secondary | ICD-10-CM | POA: Diagnosis not present

## 2017-12-08 DIAGNOSIS — R339 Retention of urine, unspecified: Secondary | ICD-10-CM | POA: Diagnosis not present

## 2017-12-08 DIAGNOSIS — R7989 Other specified abnormal findings of blood chemistry: Secondary | ICD-10-CM | POA: Diagnosis not present

## 2017-12-08 DIAGNOSIS — Z79899 Other long term (current) drug therapy: Secondary | ICD-10-CM | POA: Insufficient documentation

## 2017-12-08 DIAGNOSIS — I21A1 Myocardial infarction type 2: Secondary | ICD-10-CM | POA: Diagnosis not present

## 2017-12-08 DIAGNOSIS — I493 Ventricular premature depolarization: Secondary | ICD-10-CM | POA: Diagnosis present

## 2017-12-08 DIAGNOSIS — Z4659 Encounter for fitting and adjustment of other gastrointestinal appliance and device: Secondary | ICD-10-CM | POA: Diagnosis not present

## 2017-12-08 DIAGNOSIS — R079 Chest pain, unspecified: Secondary | ICD-10-CM | POA: Diagnosis not present

## 2017-12-08 DIAGNOSIS — Z885 Allergy status to narcotic agent status: Secondary | ICD-10-CM

## 2017-12-08 DIAGNOSIS — E876 Hypokalemia: Secondary | ICD-10-CM | POA: Diagnosis not present

## 2017-12-08 DIAGNOSIS — Z8673 Personal history of transient ischemic attack (TIA), and cerebral infarction without residual deficits: Secondary | ICD-10-CM

## 2017-12-08 DIAGNOSIS — Z7982 Long term (current) use of aspirin: Secondary | ICD-10-CM | POA: Insufficient documentation

## 2017-12-08 DIAGNOSIS — D696 Thrombocytopenia, unspecified: Secondary | ICD-10-CM | POA: Diagnosis present

## 2017-12-08 DIAGNOSIS — M1990 Unspecified osteoarthritis, unspecified site: Secondary | ICD-10-CM | POA: Diagnosis present

## 2017-12-08 DIAGNOSIS — E7800 Pure hypercholesterolemia, unspecified: Secondary | ICD-10-CM | POA: Diagnosis not present

## 2017-12-08 DIAGNOSIS — Z833 Family history of diabetes mellitus: Secondary | ICD-10-CM

## 2017-12-08 DIAGNOSIS — Z888 Allergy status to other drugs, medicaments and biological substances status: Secondary | ICD-10-CM | POA: Diagnosis not present

## 2017-12-08 DIAGNOSIS — K8031 Calculus of bile duct with cholangitis, unspecified, with obstruction: Principal | ICD-10-CM | POA: Diagnosis present

## 2017-12-08 DIAGNOSIS — M1611 Unilateral primary osteoarthritis, right hip: Secondary | ICD-10-CM | POA: Diagnosis present

## 2017-12-08 DIAGNOSIS — K838 Other specified diseases of biliary tract: Secondary | ICD-10-CM | POA: Diagnosis not present

## 2017-12-08 DIAGNOSIS — Z9049 Acquired absence of other specified parts of digestive tract: Secondary | ICD-10-CM

## 2017-12-08 DIAGNOSIS — Z880 Allergy status to penicillin: Secondary | ICD-10-CM | POA: Diagnosis not present

## 2017-12-08 DIAGNOSIS — R932 Abnormal findings on diagnostic imaging of liver and biliary tract: Secondary | ICD-10-CM | POA: Diagnosis not present

## 2017-12-08 DIAGNOSIS — I10 Essential (primary) hypertension: Secondary | ICD-10-CM | POA: Diagnosis present

## 2017-12-08 DIAGNOSIS — K8050 Calculus of bile duct without cholangitis or cholecystitis without obstruction: Secondary | ICD-10-CM | POA: Insufficient documentation

## 2017-12-08 DIAGNOSIS — K8309 Other cholangitis: Secondary | ICD-10-CM | POA: Diagnosis not present

## 2017-12-08 DIAGNOSIS — H3530 Unspecified macular degeneration: Secondary | ICD-10-CM | POA: Diagnosis present

## 2017-12-08 DIAGNOSIS — R1013 Epigastric pain: Secondary | ICD-10-CM | POA: Diagnosis not present

## 2017-12-08 DIAGNOSIS — R1011 Right upper quadrant pain: Secondary | ICD-10-CM | POA: Diagnosis not present

## 2017-12-08 DIAGNOSIS — K8030 Calculus of bile duct with cholangitis, unspecified, without obstruction: Secondary | ICD-10-CM | POA: Diagnosis not present

## 2017-12-08 DIAGNOSIS — Z951 Presence of aortocoronary bypass graft: Secondary | ICD-10-CM | POA: Insufficient documentation

## 2017-12-08 DIAGNOSIS — R7881 Bacteremia: Secondary | ICD-10-CM | POA: Diagnosis present

## 2017-12-08 DIAGNOSIS — R17 Unspecified jaundice: Secondary | ICD-10-CM | POA: Diagnosis not present

## 2017-12-08 DIAGNOSIS — I361 Nonrheumatic tricuspid (valve) insufficiency: Secondary | ICD-10-CM | POA: Diagnosis not present

## 2017-12-08 DIAGNOSIS — I2510 Atherosclerotic heart disease of native coronary artery without angina pectoris: Secondary | ICD-10-CM

## 2017-12-08 DIAGNOSIS — R945 Abnormal results of liver function studies: Secondary | ICD-10-CM

## 2017-12-08 DIAGNOSIS — Z79890 Hormone replacement therapy (postmenopausal): Secondary | ICD-10-CM

## 2017-12-08 DIAGNOSIS — Z881 Allergy status to other antibiotic agents status: Secondary | ICD-10-CM | POA: Diagnosis not present

## 2017-12-08 DIAGNOSIS — Z8249 Family history of ischemic heart disease and other diseases of the circulatory system: Secondary | ICD-10-CM | POA: Diagnosis not present

## 2017-12-08 DIAGNOSIS — R978 Other abnormal tumor markers: Secondary | ICD-10-CM | POA: Diagnosis not present

## 2017-12-08 DIAGNOSIS — R778 Other specified abnormalities of plasma proteins: Secondary | ICD-10-CM

## 2017-12-08 DIAGNOSIS — R109 Unspecified abdominal pain: Secondary | ICD-10-CM | POA: Diagnosis not present

## 2017-12-08 DIAGNOSIS — R740 Nonspecific elevation of levels of transaminase and lactic acid dehydrogenase [LDH]: Secondary | ICD-10-CM | POA: Diagnosis not present

## 2017-12-08 DIAGNOSIS — R748 Abnormal levels of other serum enzymes: Secondary | ICD-10-CM | POA: Diagnosis not present

## 2017-12-08 LAB — COMPREHENSIVE METABOLIC PANEL
ALT: 707 U/L — AB (ref 0–44)
ANION GAP: 14 (ref 5–15)
AST: 927 U/L — ABNORMAL HIGH (ref 15–41)
Albumin: 4.2 g/dL (ref 3.5–5.0)
Alkaline Phosphatase: 185 U/L — ABNORMAL HIGH (ref 38–126)
BUN: 17 mg/dL (ref 8–23)
CHLORIDE: 98 mmol/L (ref 98–111)
CO2: 26 mmol/L (ref 22–32)
Calcium: 9.4 mg/dL (ref 8.9–10.3)
Creatinine, Ser: 0.88 mg/dL (ref 0.44–1.00)
GFR calc non Af Amer: 60 mL/min — ABNORMAL LOW (ref >60)
Glucose, Bld: 136 mg/dL — ABNORMAL HIGH (ref 70–99)
Potassium: 3.3 mmol/L — ABNORMAL LOW (ref 3.5–5.1)
SODIUM: 138 mmol/L (ref 135–145)
Total Bilirubin: 8.4 mg/dL — ABNORMAL HIGH (ref 0.3–1.2)
Total Protein: 7.7 g/dL (ref 6.5–8.1)

## 2017-12-08 LAB — CBC WITH DIFFERENTIAL/PLATELET
ABS IMMATURE GRANULOCYTES: 0.06 10*3/uL (ref 0.00–0.07)
BASOS PCT: 0 %
Basophils Absolute: 0 10*3/uL (ref 0.0–0.1)
Eosinophils Absolute: 0 10*3/uL (ref 0.0–0.5)
Eosinophils Relative: 0 %
HCT: 42.3 % (ref 36.0–46.0)
Hemoglobin: 14.2 g/dL (ref 12.0–15.0)
Immature Granulocytes: 1 %
Lymphocytes Relative: 9 %
Lymphs Abs: 0.9 10*3/uL (ref 0.7–4.0)
MCH: 30.1 pg (ref 26.0–34.0)
MCHC: 33.6 g/dL (ref 30.0–36.0)
MCV: 89.8 fL (ref 80.0–100.0)
MONO ABS: 0.3 10*3/uL (ref 0.1–1.0)
Monocytes Relative: 3 %
NEUTROS ABS: 9 10*3/uL — AB (ref 1.7–7.7)
NEUTROS PCT: 87 %
Platelets: 142 10*3/uL — ABNORMAL LOW (ref 150–400)
RBC: 4.71 MIL/uL (ref 3.87–5.11)
RDW: 13 % (ref 11.5–15.5)
WBC: 10.3 10*3/uL (ref 4.0–10.5)
nRBC: 0 % (ref 0.0–0.2)

## 2017-12-08 LAB — URINALYSIS, COMPLETE (UACMP) WITH MICROSCOPIC
Bilirubin Urine: NEGATIVE
GLUCOSE, UA: NEGATIVE mg/dL
HGB URINE DIPSTICK: NEGATIVE
KETONES UR: NEGATIVE mg/dL
LEUKOCYTES UA: NEGATIVE
NITRITE: NEGATIVE
PROTEIN: NEGATIVE mg/dL
Specific Gravity, Urine: 1.01 (ref 1.005–1.030)
pH: 6 (ref 5.0–8.0)

## 2017-12-08 LAB — TROPONIN I: TROPONIN I: 0.25 ng/mL — AB (ref <0.03)

## 2017-12-08 LAB — LIPASE, BLOOD: Lipase: 32 U/L (ref 11–51)

## 2017-12-08 LAB — CG4 I-STAT (LACTIC ACID): Lactic Acid, Venous: 1.73 mmol/L (ref 0.5–1.9)

## 2017-12-08 MED ORDER — ONDANSETRON HCL 4 MG/2ML IJ SOLN
4.0000 mg | Freq: Four times a day (QID) | INTRAMUSCULAR | Status: DC | PRN
  Administered 2017-12-12: 4 mg via INTRAVENOUS
  Filled 2017-12-08: qty 2

## 2017-12-08 MED ORDER — ONDANSETRON HCL 4 MG PO TABS
4.0000 mg | ORAL_TABLET | Freq: Four times a day (QID) | ORAL | Status: DC | PRN
  Administered 2017-12-09: 4 mg via ORAL
  Filled 2017-12-08: qty 1

## 2017-12-08 MED ORDER — IOPAMIDOL (ISOVUE-300) INJECTION 61%
30.0000 mL | Freq: Once | INTRAVENOUS | Status: AC | PRN
  Administered 2017-12-08: 30 mL via ORAL

## 2017-12-08 MED ORDER — FENTANYL CITRATE (PF) 100 MCG/2ML IJ SOLN
50.0000 ug | Freq: Once | INTRAMUSCULAR | Status: AC
  Administered 2017-12-08: 50 ug via INTRAVENOUS
  Filled 2017-12-08: qty 2

## 2017-12-08 MED ORDER — LORAZEPAM 0.5 MG PO TABS: 0.5000 mg | ORAL_TABLET | Freq: Three times a day (TID) | ORAL | Status: DC | PRN

## 2017-12-08 MED ORDER — SODIUM CHLORIDE 0.9 % IV SOLN
1.0000 g | Freq: Two times a day (BID) | INTRAVENOUS | Status: DC
  Administered 2017-12-09 (×3): 1 g via INTRAVENOUS
  Filled 2017-12-08 (×5): qty 1

## 2017-12-08 MED ORDER — IOPAMIDOL (ISOVUE-300) INJECTION 61%
100.0000 mL | Freq: Once | INTRAVENOUS | Status: AC | PRN
  Administered 2017-12-08: 100 mL via INTRAVENOUS

## 2017-12-08 MED ORDER — METOPROLOL SUCCINATE ER 50 MG PO TB24
50.0000 mg | ORAL_TABLET | Freq: Every day | ORAL | Status: DC
  Administered 2017-12-09 – 2017-12-12 (×4): 50 mg via ORAL
  Filled 2017-12-08 (×4): qty 1

## 2017-12-08 MED ORDER — HYDRALAZINE HCL 20 MG/ML IJ SOLN
5.0000 mg | INTRAMUSCULAR | Status: DC | PRN
  Administered 2017-12-09 – 2017-12-12 (×4): 5 mg via INTRAVENOUS
  Filled 2017-12-08 (×4): qty 1

## 2017-12-08 MED ORDER — SODIUM CHLORIDE 0.9 % IV SOLN
Freq: Once | INTRAVENOUS | Status: AC
  Administered 2017-12-08: 16:00:00 via INTRAVENOUS

## 2017-12-08 MED ORDER — LABETALOL HCL 5 MG/ML IV SOLN
10.0000 mg | Freq: Once | INTRAVENOUS | Status: AC
  Administered 2017-12-08: 10 mg via INTRAVENOUS
  Filled 2017-12-08: qty 4

## 2017-12-08 MED ORDER — SODIUM CHLORIDE 0.9 % IV BOLUS
1000.0000 mL | Freq: Once | INTRAVENOUS | Status: AC
  Administered 2017-12-08: 1000 mL via INTRAVENOUS

## 2017-12-08 MED ORDER — NITROGLYCERIN 0.4 MG SL SUBL: 0.4000 mg | SUBLINGUAL_TABLET | SUBLINGUAL | Status: DC | PRN

## 2017-12-08 MED ORDER — FENTANYL CITRATE (PF) 100 MCG/2ML IJ SOLN
75.0000 ug | Freq: Once | INTRAMUSCULAR | Status: AC
  Administered 2017-12-08: 75 ug via INTRAVENOUS
  Filled 2017-12-08: qty 2

## 2017-12-08 MED ORDER — SODIUM CHLORIDE 0.9 % IV SOLN
1.0000 g | Freq: Three times a day (TID) | INTRAVENOUS | Status: DC
  Administered 2017-12-08: 1 g via INTRAVENOUS
  Filled 2017-12-08 (×4): qty 1

## 2017-12-08 MED ORDER — ASPIRIN 325 MG PO TABS
325.0000 mg | ORAL_TABLET | Freq: Once | ORAL | Status: AC
  Administered 2017-12-08: 325 mg via ORAL
  Filled 2017-12-08: qty 1

## 2017-12-08 MED ORDER — FENTANYL CITRATE (PF) 100 MCG/2ML IJ SOLN
25.0000 ug | INTRAMUSCULAR | Status: DC | PRN
  Administered 2017-12-08: 25 ug via INTRAVENOUS
  Filled 2017-12-08: qty 2

## 2017-12-08 MED ORDER — ONDANSETRON HCL 4 MG/2ML IJ SOLN
4.0000 mg | Freq: Once | INTRAMUSCULAR | Status: AC
  Administered 2017-12-08: 4 mg via INTRAVENOUS
  Filled 2017-12-08: qty 2

## 2017-12-08 NOTE — ED Notes (Signed)
Dr. Darl Householder notified of troponin 0.25

## 2017-12-08 NOTE — ED Notes (Signed)
Pt returned from CT °

## 2017-12-08 NOTE — ED Notes (Signed)
Pt grandson Myriam Jacobson, requesting to be called with updates on pt's room number, when pt is transferred. Casey's number is [phone]

## 2017-12-08 NOTE — ED Notes (Signed)
Lemon swabs and mouth moisterizer given.

## 2017-12-08 NOTE — ED Provider Notes (Addendum)
[facility] EMERGENCY DEPARTMENT Provider Note   CSN: [id number] Arrival date & time: 12/08/17  1018     History   Chief Complaint Chief Complaint  Patient presents with  . Abdominal Pain    HPI MIKAELYN ARTHURS is a 84 y.o. female history of CAD status post CABG, hypertension, previous hysterectomy here presenting with abdominal pain, chest pain.  Patient states that she has been having epigastric pain and back pain since yesterday.  She also has been nauseated.  This morning she did eat breakfast and then vomited out her breakfast afterwards.  Patient also developed chest pain this morning as well.  Patient states that the chest pain seems to be radiating from her abdominal pain.  She took nitros at home but still had pain.  Patient came by EMS was noted to be hypertensive 206/98.   The history is provided by the patient.    Past Medical History:  Diagnosis Date  . Anxiety   . Atypical carcinoid lung tumor ([place])   . CAD (coronary artery disease)    a. 1986: s/p PTCA of bifurcation lesion in LAD;  b. 2001: s/p BMS to LAD & LCX;  c. 04/2006 Cath: diff CAD w/o critical narrowing, nl EF;  d. 12/2014 MV: inflat ST dep, mild basal inf ischemia, EF 55-65%-->Low risk; e. 12/2016 Cath: LM 90d, LAD 90ost, 50p, 19m, LCX 80ost, 66m, RCA 90p, 22m/100-CTO w/ L->R collats filling RPDA.  . Cancer ([place])   . Carotid arterial disease ([place])    a. 08/2015 Carotid U/S: < 39% bilat ICA stenosis.  . Carotid disease, bilateral ([place])    a. RICA 1-30%, LICA 86-57%  . Cataracts, bilateral   . Cholelithiases   . Chronic diarrhea   . Depression   . Gilbert's syndrome   . History of echocardiogram    a. 06/2010 Echo: EF 60-65%, mild AI/MR; b. 10/2015 Echo: EF 60-65%, no rwma, mild MR, nl RV fxn.  Marland Kitchen HTN (hypertension)   . Hyperlipidemia   . Impaired fasting blood sugar   . Keloid    mid sternum  . Macular degeneration   . Osteoarthritis   . Positional vertigo    benign  . S/P CABG  x 3 12/23/2016   LIMA to LAD, SVG to OM, SVG to PDA, EVH via left thigh  . Symptoms, such as flushing, sleeplessness, headache, lack of concentration, associated with the menopause   . TIA (transient ischemic attack)     Patient Active Problem List   Diagnosis Date Noted  . S/P CABG x 3 12/23/2016  . On intra-aortic balloon pump assist   . NSTEMI (non-ST elevated myocardial infarction) ([place]) 12/22/2016  . Weakness of lower extremity 12/09/2015  . Viral URI 11/07/2015  . Hypokalemia 11/07/2015  . Cough   . Diaphoresis   . Sore throat   . Non-ST elevation (NSTEMI) myocardial infarction ([place]) 12/29/2014  . Chest pain 12/28/2014  . Angina pectoris ([place])   . Coronary artery disease involving native coronary artery of native heart with unstable angina pectoris ([place])   . Emesis   . Gastroenteritis, acute   . PVC's (premature ventricular contractions) 04/25/2012  . Cardiac arrest ([place])   . Left carotid stenosis 03/01/2012  . Leg fatigue 03/01/2012  . Dysuria 03/30/2011  . Mitral regurgitation 03/30/2011  . HYPERCHOLESTEROLEMIA  IIA 08/06/2008  . Carotid bruit 05/28/2008  . Essential hypertension 05/11/2008  . Coronary atherosclerosis 05/11/2008  . Angina at rest [facility]) 05/11/2008    Past  Surgical History:  Procedure Laterality Date  . ABDOMINAL AORTOGRAM N/A 12/22/2016   Procedure: ABDOMINAL AORTOGRAM;  Surgeon: Nelva Bush, MD;  Location: [facility] CV LAB;  Service: Cardiovascular;  Laterality: N/A;  . ABDOMINAL HYSTERECTOMY    . CHOLECYSTECTOMY    . CORONARY ARTERY BYPASS GRAFT N/A 12/23/2016   Procedure: CORONARY ARTERY BYPASS GRAFTING (CABG) TIMES THREE USING LEFT INTERNAL MAMMARY ARTERY AND LEFT SAPHENOUS LEG VEIN HARVESTED ENDOSCOPICALLY,;  Surgeon: Rexene Alberts, MD;  Location: [facility];  Service: Open Heart Surgery;  Laterality: N/A;  . IABP INSERTION N/A 12/22/2016   Procedure: IABP Insertion;  Surgeon: Nelva Bush, MD;  Location: [facility] CV LAB;   Service: Cardiovascular;  Laterality: N/A;  . LEFT HEART CATH AND CORONARY ANGIOGRAPHY N/A 12/22/2016   Procedure: LEFT HEART CATH AND CORONARY ANGIOGRAPHY;  Surgeon: Nelva Bush, MD;  Location: [facility] CV LAB;  Service: Cardiovascular;  Laterality: N/A;  . LEFT HEART CATHETERIZATION WITH CORONARY ANGIOGRAM N/A 04/27/2012   Procedure: LEFT HEART CATHETERIZATION WITH CORONARY ANGIOGRAM;  Surgeon: Peter M Martinique, MD;  Location: [facility] CATH LAB;  Service: Cardiovascular;  Laterality: N/A;  . LOBECTOMY Right    RLL atypical carcinoid tumor - Dr Arlyce Dice  . pci  1986  . PCI of LAD  2001   with non des lad   . pci of mid cfx     with non des and cutting balloon pci of av circumflex  . TEE WITHOUT CARDIOVERSION N/A 12/23/2016   Procedure: TRANSESOPHAGEAL ECHOCARDIOGRAM (TEE);  Surgeon: Rexene Alberts, MD;  Location: [place];  Service: Open Heart Surgery;  Laterality: N/A;     OB History   None      Home Medications    Prior to Admission medications   Medication Sig Start Date End Date Taking? Authorizing Provider  acetaminophen (TYLENOL) 325 MG tablet Take 2 tablets (650 mg total) by mouth every 6 (six) hours as needed for mild pain or headache. 01/02/17  Yes Barrett, [place] R, PA-C  amLODipine (NORVASC) 10 MG tablet Take 1 tablet (10 mg total) by mouth daily. Patient taking differently: Take 5 mg by mouth daily.  01/02/17  Yes Barrett, [place] R, PA-C  aspirin 81 MG tablet Take 81 mg by mouth daily.   Yes [provider]  atorvastatin (LIPITOR) 80 MG tablet Take 1 tablet (80 mg total) by mouth daily. 01/02/17  Yes Barrett, [place] R, PA-C  Calcium Carbonate-Vitamin D (CALCIUM 600 + D PO) Take 1 tablet by mouth daily. 600 mg/ 400 mg   Yes [provider]  clopidogrel (PLAVIX) 75 MG tablet Take 1 tablet (75 mg total) by mouth daily. 01/02/17  Yes Barrett, [place] R, PA-C  estrogens, conjugated, (PREMARIN) 0.3 MG tablet Take 0.3 mg by mouth daily. Take daily for 21 days then do not  take for 7 days.   Yes [provider]  furosemide (LASIX) 20 MG tablet Take 40 mg by mouth daily.    Yes [provider]  IRON PO Take 65 mg by mouth.   Yes [provider]  KLOR-CON M20 20 MEQ tablet TAKE 1 TABLET (20 MEQ TOTAL) BY MOUTH 2 (TWO) TIMES DAILY. 11/01/17  Yes Gollan, Kathlene November, MD  LORazepam (ATIVAN) 0.5 MG tablet Take 1 tablet (0.5 mg total) by mouth every 8 (eight) hours as needed for anxiety. 01/02/17  Yes Barrett, [place] R, PA-C  losartan (COZAAR) 100 MG tablet Take 0.5 tablets (50 mg total) by mouth 2 (two) times daily. 08/03/17  Yes Minna Merritts, MD  metoprolol succinate (TOPROL-XL) 50 MG 24 hr tablet TAKE 1 TABLET (50 MG TOTAL) BY MOUTH DAILY. TAKE WITH OR IMMEDIATELY FOLLOWING A MEAL. 07/31/17  Yes [provider]  Multiple Vitamin (MULTIVITAMIN) tablet Take 1 tablet by mouth daily.   Yes [provider]  nitroGLYCERIN (NITROSTAT) 0.4 MG SL tablet PLACE 1 TABLET (0.4 MG TOTAL) UNDER THE TONGUE EVERY 5 (FIVE) MINUTES AS NEEDED FOR CHEST PAIN. 01/04/17  Yes End, Harrell Gave, MD    Family History Family History  Problem Relation Age of Onset  . Diabetes Mellitus I Mother   . CAD Mother   . CAD Father   . Diabetes Mellitus I Sister   . Diabetes Mellitus I Brother   . Diabetes Mellitus I Sister     Social History Social History   Tobacco Use  . Smoking status: Never Smoker  . Smokeless tobacco: Never Used  Substance Use Topics  . Alcohol use: No  . Drug use: No     Allergies   Biaxin [clarithromycin]; Celecoxib; Ciprofloxacin; Codeine; Hydromorphone; Lisinopril; Macrobid [nitrofurantoin monohyd macro]; Oxycodone hcl; Prednisone; Sertraline hcl; and Penicillins   Review of Systems Review of Systems  Cardiovascular: Positive for chest pain.  Gastrointestinal: Positive for abdominal pain.  All other systems reviewed and are negative.    Physical Exam Updated Vital Signs BP (!) 161/74   Pulse 95   Temp 99.7  F (37.6 C) (Oral)   Resp 18   Ht 5\' 7"  (1.702 m)   Wt 74.8 kg   SpO2 100%   BMI 25.84 kg/m   Physical Exam  Constitutional: She is oriented to person, place, and time.  Uncomfortable, dry heaving   HENT:  Head: Normocephalic.  MM dry   Eyes: Pupils are equal, round, and reactive to light. EOM are normal.  Cardiovascular: Normal rate, regular rhythm and normal heart sounds.  Pulmonary/Chest: Effort normal and breath sounds normal.  Abdominal: Normal appearance.  Increased bowel sounds, + epigastric tenderness, mild distention.   Neurological: She is alert and oriented to person, place, and time.  Skin: Skin is warm. Capillary refill takes less than 2 seconds.  Psychiatric: She has a normal mood and affect. Her behavior is normal.  Nursing note and vitals reviewed.    ED Treatments / Results  Labs (all labs ordered are listed, but only abnormal results are displayed) Labs Reviewed  CBC WITH DIFFERENTIAL/PLATELET - Abnormal; Notable for the following components:      Result Value   Platelets 142 (*)    Neutro Abs 9.0 (*)    All other components within normal limits  COMPREHENSIVE METABOLIC PANEL - Abnormal; Notable for the following components:   Potassium 3.3 (*)    Glucose, Bld 136 (*)    AST 927 (*)    ALT 707 (*)    Alkaline Phosphatase 185 (*)    Total Bilirubin 8.4 (*)    GFR calc non Af Amer 60 (*)    All other components within normal limits  TROPONIN I - Abnormal; Notable for the following components:   Troponin I 0.25 (*)    All other components within normal limits  URINALYSIS, COMPLETE (UACMP) WITH MICROSCOPIC - Abnormal; Notable for the following components:   Color, Urine YELLOW (*)    APPearance CLEAR (*)    Bacteria, UA RARE (*)    All other components within normal limits  CULTURE, BLOOD (ROUTINE X 2)  CULTURE, BLOOD (ROUTINE X 2)  LIPASE, BLOOD  HEPATITIS PANEL, ACUTE  I-STAT CG4 LACTIC ACID, ED  CG4 I-STAT (LACTIC ACID)    EKG EKG  Interpretation  Date/Time:  Wednesday December 08 2017 10:23:51 EST Ventricular Rate:  94 PR Interval:    QRS Duration: 86 QT Interval:  358 QTC Calculation: 448 R Axis:   68 Text Interpretation:  Sinus rhythm Prolonged PR interval LAE, consider biatrial enlargement Repol abnrm, severe global ischemia (LM/MVD) Baseline wander in lead(s) II III aVL aVF previous tracing showed bigeminy  Confirmed by Wandra Arthurs [phone]) on 12/08/2017 10:37:45 AM   Radiology Dg Chest 2 View  Result Date: 12/08/2017 CLINICAL DATA:  Chest pain. Right upper quadrant pain. Nausea and vomiting. EXAM: CHEST - 2 VIEW COMPARISON:  07/03/2017 FINDINGS: Sequelae of prior CABG and right lower lobectomy are again identified with volume loss in the right hemithorax. The cardiac silhouette remains mildly enlarged. Aortic atherosclerosis is noted. No airspace consolidation, edema, or pneumothorax is identified. There is chronic blunting of the right costophrenic angle which may reflect a small pleural effusion or pleural thickening, unchanged. No acute osseous abnormality is identified. IMPRESSION: Small chronic right pleural effusion or pleural thickening without evidence of acute airspace disease. Electronically Signed   By: Logan Bores M.D.   On: 12/08/2017 11:19   Ct Abdomen Pelvis W Contrast  Result Date: 12/08/2017 CLINICAL DATA:  84 year old female with nausea vomiting and right upper quadrant abdominal pain for 2 days. History of lung cancer. Dilated CBD and intrahepatic ducts on right upper quadrant ultrasound today. EXAM: CT ABDOMEN AND PELVIS WITH CONTRAST TECHNIQUE: Multidetector CT imaging of the abdomen and pelvis was performed using the standard protocol following bolus administration of intravenous contrast. CONTRAST:  151mL ISOVUE-300 IOPAMIDOL (ISOVUE-300) INJECTION 61% COMPARISON:  Right upper quadrant ultrasound 1150 hours today. CT Abdomen and Pelvis 12/21/2016. FINDINGS: Lower chest: Cardiomegaly and lung  bases are stable since 2018, including a small layering right pleural effusion. Hepatobiliary: Intra- and extrahepatic biliary ductal dilatation. The CBD is up to 16 millimeters. In the distal CBD at the level of the pancreas there is a 12 millimeter rounded hyperdense filling defect with stippled fat density compatible with choledocholithiasis. See coronal image 36 and sagittal image 65. This is just proximal to the ampullary. Surgically absent gallbladder. Pancreas: Negative; no pancreatic inflammation or main pancreatic ductal dilatation. Spleen: Stable and negative; scattered small calcified granulomas. Adrenals/Urinary Tract: Normal adrenal glands. Bilateral renal enhancement and contrast excretion is symmetric and within normal limits. Extrarenal pelvis, normal variant. Decompressed ureters. The bladder is distended but otherwise unremarkable. Estimated bladder volume is 566 milliliters. Stomach/Bowel: Mild gaseous distension of the rectum. Decompressed and negative sigmoid colon, descending colon, redundant transverse colon. Negative right colon. The cecum is on a lax mesentery located anteriorly. The appendix is diminutive or absent. Negative terminal ileum. No dilated small bowel. Small gastric hiatal hernia suspected. Negative intraabdominal stomach. No abnormality of the duodenum. No free air, free fluid. Vascular/Lymphatic: Severe Aortoiliac calcified atherosclerosis. The major arterial structures in the abdomen and pelvis remain patent. Portal venous system is patent. No lymphadenopathy. Reproductive: Surgically absent. Other: No pelvic free fluid. Musculoskeletal: No acute osseous abnormality identified. IMPRESSION: 1. Obstructing 12 mm choledocholithiasis suspected in the distal CBD. Intra- and extrahepatic biliary ductal dilatation. 2. No other acute or inflammatory process identified in the abdomen or pelvis. 3. Distended urinary bladder with estimated volume of 566 mL. 4. Small chronic right  pleural effusion. 5.  Aortic Atherosclerosis (ICD10-I70.0). Electronically Signed   By: Herminio Heads.D.  On: 12/08/2017 12:36   US Abdomen Limited Ruq  Result Date: 12/08/2017 CLINICAL DATA:  84 year old female with elevated LFTs. EXAM: ULTRASOUND ABDOMEN LIMITED RIGHT UPPER QUADRANT COMPARISON:  CT Abdomen and Pelvis today reported separately. CT Abdomen and Pelvis 12/21/2016. FINDINGS: Gallbladder: Surgically absent. Common bile duct: Diameter: Up to 17 millimeters (dilated) on image 5. However, the CBD was 14 millimeters in 2018. No obstructing lesion identified within the visible portion of the duct. Liver: Intrahepatic ductal dilatation (image 24). Liver echogenicity within normal limits. No discrete liver lesion. Portal vein is patent on color Doppler imaging with normal direction of blood flow towards the liver. Other findings: A small right pleural effusion is noted on image 42. Negative visible right kidney. IMPRESSION: 1. Dilated CBD and intrahepatic biliary ductal dilatation, similar to but greater than that by CT in 2018 which appeared due to choledocholithiasis. 2. Small right pleural effusion. Electronically Signed   By: Genevie Ann M.D.   On: 12/08/2017 12:17    Procedures Procedures (including critical care time)  Medications Ordered in ED Medications  meropenem (MERREM) 1 g in sodium chloride 0.9 % 100 mL IVPB ( Intravenous Stopped 12/08/17 1418)  0.9 %  sodium chloride infusion (has no administration in time range)  fentaNYL (SUBLIMAZE) injection 50 mcg (50 mcg Intravenous Given 12/08/17 1031)  ondansetron (ZOFRAN) injection 4 mg (4 mg Intravenous Given 12/08/17 1031)  sodium chloride 0.9 % bolus 1,000 mL (1,000 mLs Intravenous Bolus 12/08/17 1041)  labetalol (NORMODYNE,TRANDATE) injection 10 mg (10 mg Intravenous Given 12/08/17 1031)  iopamidol (ISOVUE-300) 61 % injection 30 mL (30 mLs Oral Contrast Given 12/08/17 1037)  aspirin tablet 325 mg (325 mg Oral Given 12/08/17 1137)    iopamidol (ISOVUE-300) 61 % injection 100 mL (100 mLs Intravenous Contrast Given 12/08/17 1205)  sodium chloride 0.9 % bolus 1,000 mL (1,000 mLs Intravenous Bolus 12/08/17 1314)  fentaNYL (SUBLIMAZE) injection 50 mcg (50 mcg Intravenous Given 12/08/17 1339)     Initial Impression / Assessment and Plan / ED Course  I have reviewed the triage vital signs and the nursing notes.  Pertinent labs & imaging results that were available during my care of the patient were reviewed by me and considered in my medical decision making (see chart for details).    MERCADEZ HEITMAN is a 84 y.o. female hx of CABG, hysterectomy, here with abdominal pain, chest pain. Consider SBO vs pancreatitis vs biliary colic. Patient has hx of CABG so high risk for ACS as well. Will get labs, CT ab/pel, trop. Will likely need admission.   12: 30 pm Patient's bili was 8, AST/ALT around 1000. RUQ US showed dilated CBD. CT also showed dilated CBD with 12 mm stone. I think patient has choledocholithiasis. I called Dr. Bonna Gains from GI. She states that patient needs ERCP and she doesn't do ERCP and will need to be transferred to tertiary care center. Patient request [facility] Paged Care link to call GI at [facility].   2 PM Didn't hear back from [facility] despite multiple attempts and messages. Patient developed temp 101.5 F in the mean time. Concerned that she may be septic from choledocholithiasis vs early cholangitis so I ordered lactate, blood cultures, IV zosyn. Also sent hepatitis panel. Of note, her Trop is 0.25, likely demand ischemia. Given ASA, hold heparin for now given potential procedure or surgery. Will call [facility] transfer center.   2:20 pm Dr. Cristina Gong from GI called back and will see patient on transfer. Recommend transfer to [facility]. Will  page hospitalist at [facility].   2:50 PM Dr. Earnest Conroy, hospitalist at [facility], will accept patient at [facility]. There is no stepdown or tele bed currently. I talked to Dr. Tyrone Nine in the ED, who accepting  patient on transfer to the ED to expediate the ERCP. Hospitalist to admit afterwards.      Final Clinical Impressions(s) / ED Diagnoses   Final diagnoses:  Elevated LFTs  Choledocholithiasis  Elevated troponin    ED Discharge Orders    None       Drenda Freeze, MD 12/08/17 1450    Drenda Freeze, MD 12/08/17 1540

## 2017-12-08 NOTE — ED Triage Notes (Signed)
Pt comes from home after n/v for 2 days. Pt c/o RUQ pain. Emesis 2x yesterday and 1x with EMS. She took 1 of her own nitro. 206/98 per EMS. Reports some vision changes with EMS. CBG 145 per ems.

## 2017-12-08 NOTE — ED Notes (Signed)
Patient transported to CT/ US °

## 2017-12-08 NOTE — ED Triage Notes (Signed)
Pt arrives from [facility] ED with abd pain and nausea/vomiting, coming for GI consult/admission for elevated LFTs and ERCP.

## 2017-12-08 NOTE — Consult Note (Signed)
Referring Provider:  Dr. Hal Hope ([facility]) Primary Care Physician:  Lajean Manes, MD Primary Gastroenterologist:   None (unassigned)  Reason for Consultation: Common bile duct stone  HPI: [name] is a 84 y.o. female is transferred from [facility] hospital because of a stone in the common bile duct.  The patient, who states she is several years status post cholecystectomy, developed epigastric abdominal pain yesterday morning, which has persisted since that time and has been associated with nausea, vomiting, food intolerance, and chills.  She presented today to the [facility] emergency room where she was noted to have markedly elevated liver chemistries, and a stone in the common bile duct seen on CT scanning, with associated biliary ductal dilatation.  Lipase normal.  White count borderline elevated.  She did develop a fever to 101.5 degrees while in the emergency room there.  She is transferred here because ERCP services are not available there.  Total bilirubin 8.4, alk phos 185, AST 927, ALT 707, white count 10,300, platelets 142,000.  The patient thinks that her pain is perhaps a little bit better but not by any means resolved, roughly 5 on a scale of 10 at present.  The patient has a history of a non-ST elevation MI last December, with three-vessel bypass surgery done at that time.  Cardiac ejection fraction at that time was normal.  She is maintained on aspirin and Plavix, and her last dose of Plavix was this morning.  Her troponins were elevated in the emergency room, to a level of 0.25.  She indicates that her functional status is reasonable, fairly sedentary lifestyle.  She lives at home with her son.  She walks around the house but does not get out very much.  Arthritis in her right hip is her main limiting factor, no problems with chest pain or shortness of breath.     Past Medical History:  Diagnosis Date  . Anxiety   . Atypical carcinoid lung tumor ([place])    . CAD (coronary artery disease)    a. 1986: s/p PTCA of bifurcation lesion in LAD;  b. 2001: s/p BMS to LAD & LCX;  c. 04/2006 Cath: diff CAD w/o critical narrowing, nl EF;  d. 12/2014 MV: inflat ST dep, mild basal inf ischemia, EF 55-65%-->Low risk; e. 12/2016 Cath: LM 90d, LAD 90ost, 50p, 72m LCX 80ost, 2107mRCA 90p, 7060m0-CTO w/ L->R collats filling RPDA.  . Cancer ([facility] . Carotid arterial disease ([facility]  a. 08/2015 Carotid U/S: < 39% bilat ICA stenosis.  . Carotid disease, bilateral ([facility]  a. RICA 0-31-44%ICA 40-31-54% Cataracts, bilateral   . Cholelithiases   . Chronic diarrhea   . Depression   . Gilbert's syndrome   . History of echocardiogram    a. 06/2010 Echo: EF 60-65%, mild AI/MR; b. 10/2015 Echo: EF 60-65%, no rwma, mild MR, nl RV fxn.  . HMarland KitchenN (hypertension)   . Hyperlipidemia   . Impaired fasting blood sugar   . Keloid    mid sternum  . Macular degeneration   . Osteoarthritis   . Positional vertigo    benign  . S/P CABG x 3 12/23/2016   LIMA to LAD, SVG to OM, SVG to PDA, EVH via left thigh  . Symptoms, such as flushing, sleeplessness, headache, lack of concentration, associated with the menopause   . TIA (transient ischemic attack)     Past Surgical History:  Procedure Laterality Date  . ABDOMINAL AORTOGRAM N/A  12/22/2016   Procedure: ABDOMINAL AORTOGRAM;  Surgeon: Nelva Bush, MD;  Location: [place] [facility];  Service: Cardiovascular;  Laterality: N/A;  . ABDOMINAL HYSTERECTOMY    . CHOLECYSTECTOMY    . CORONARY ARTERY BYPASS GRAFT N/A 12/23/2016   Procedure: CORONARY ARTERY BYPASS GRAFTING (CABG) TIMES THREE USING LEFT INTERNAL MAMMARY ARTERY AND LEFT SAPHENOUS LEG VEIN HARVESTED ENDOSCOPICALLY,;  Surgeon: Rexene Alberts, MD;  Location: [place];  Service: Open Heart Surgery;  Laterality: N/A;  . IABP INSERTION N/A 12/22/2016   Procedure: IABP Insertion;  Surgeon: Nelva Bush, MD;  Location: [place] [facility];  Service: Cardiovascular;   Laterality: N/A;  . LEFT HEART CATH AND CORONARY ANGIOGRAPHY N/A 12/22/2016   Procedure: LEFT HEART CATH AND CORONARY ANGIOGRAPHY;  Surgeon: Nelva Bush, MD;  Location: [place] [facility];  Service: Cardiovascular;  Laterality: N/A;  . LEFT HEART CATHETERIZATION WITH CORONARY ANGIOGRAM N/A 04/27/2012   Procedure: LEFT HEART CATHETERIZATION WITH CORONARY ANGIOGRAM;  Surgeon: Peter M Martinique, MD;  Location: [facility] C[facility];  Service: Cardiovascular;  Laterality: N/A;  . LOBECTOMY Right    RLL atypical carcinoid tumor - Dr Arlyce Dice  . pci  1986  . PCI of LAD  2001   with non des lad   . pci of mid cfx     with non des and cutting balloon pci of av circumflex  . TEE WITHOUT CARDIOVERSION N/A 12/23/2016   Procedure: TRANSESOPHAGEAL ECHOCARDIOGRAM (TEE);  Surgeon: Rexene Alberts, MD;  Location: [place];  Service: Open Heart Surgery;  Laterality: N/A;    Prior to Admission medications   Medication Sig Start Date End Date Taking? Authorizing Provider  acetaminophen (TYLENOL) 325 MG tablet Take 2 tablets (650 mg total) by mouth every 6 (six) hours as needed for mild pain or headache. 01/02/17  Yes Barrett, Erin R, PA-C  amLODipine (NORVASC) 10 MG tablet Take 1 tablet (10 mg total) by mouth daily. Patient taking differently: Take 5 mg by mouth daily.  01/02/17  Yes Barrett, Erin R, PA-C  aspirin 81 MG tablet Take 81 mg by mouth daily.   Yes [provider]  atorvastatin (LIPITOR) 80 MG tablet Take 1 tablet (80 mg total) by mouth daily. 01/02/17  Yes Barrett, Erin R, PA-C  Calcium Carbonate-Vitamin D (CALCIUM 600 + D PO) Take 1 tablet by mouth daily. 600 mg/ 400 mg   Yes [provider]  clopidogrel (PLAVIX) 75 MG tablet Take 1 tablet (75 mg total) by mouth daily. 01/02/17  Yes Barrett, Erin R, PA-C  estrogens, conjugated, (PREMARIN) 0.3 MG tablet Take 0.3 mg by mouth daily. Take daily for 21 days then do not take for 7 days.   Yes [provider]  furosemide (LASIX) 20 MG  tablet Take 40 mg by mouth daily.    Yes [provider]  IRON PO Take 65 mg by mouth.   Yes [provider]  KLOR-CON M20 20 MEQ tablet TAKE 1 TABLET (20 MEQ TOTAL) BY MOUTH 2 (TWO) TIMES DAILY. 11/01/17  Yes Gollan, Kathlene November, MD  LORazepam (ATIVAN) 0.5 MG tablet Take 1 tablet (0.5 mg total) by mouth every 8 (eight) hours as needed for anxiety. 01/02/17  Yes Barrett, Erin R, PA-C  losartan (COZAAR) 100 MG tablet Take 0.5 tablets (50 mg total) by mouth 2 (two) times daily. 08/03/17  Yes Minna Merritts, MD  metoprolol succinate (TOPROL-XL) 50 MG 24 hr tablet Take 50 mg by mouth daily.  07/31/17  Yes [provider]  Multiple Vitamin (MULTIVITAMIN) tablet Take 1 tablet by mouth daily.   Yes [provider]  nitroGLYCERIN (NITROSTAT) 0.4 MG SL tablet PLACE 1 TABLET (0.4 MG TOTAL) UNDER THE TONGUE EVERY 5 (FIVE) MINUTES AS NEEDED FOR CHEST PAIN. 01/04/17  Yes End, Harrell Gave, MD    No current facility-administered medications for this encounter.    Current Outpatient Medications  Medication Sig Dispense Refill  . acetaminophen (TYLENOL) 325 MG tablet Take 2 tablets (650 mg total) by mouth every 6 (six) hours as needed for mild pain or headache.    Marland Kitchen amLODipine (NORVASC) 10 MG tablet Take 1 tablet (10 mg total) by mouth daily. (Patient taking differently: Take 5 mg by mouth daily. )    . aspirin 81 MG tablet Take 81 mg by mouth daily.    Marland Kitchen atorvastatin (LIPITOR) 80 MG tablet Take 1 tablet (80 mg total) by mouth daily.    . Calcium Carbonate-Vitamin D (CALCIUM 600 + D PO) Take 1 tablet by mouth daily. 600 mg/ 400 mg    . clopidogrel (PLAVIX) 75 MG tablet Take 1 tablet (75 mg total) by mouth daily.    Marland Kitchen estrogens, conjugated, (PREMARIN) 0.3 MG tablet Take 0.3 mg by mouth daily. Take daily for 21 days then do not take for 7 days.    . furosemide (LASIX) 20 MG tablet Take 40 mg by mouth daily.     . IRON PO Take 65 mg by mouth.    Marland Kitchen KLOR-CON M20 20 MEQ tablet TAKE  1 TABLET (20 MEQ TOTAL) BY MOUTH 2 (TWO) TIMES DAILY. 180 tablet 3  . LORazepam (ATIVAN) 0.5 MG tablet Take 1 tablet (0.5 mg total) by mouth every 8 (eight) hours as needed for anxiety. 30 tablet 0  . losartan (COZAAR) 100 MG tablet Take 0.5 tablets (50 mg total) by mouth 2 (two) times daily. 90 tablet 3  . metoprolol succinate (TOPROL-XL) 50 MG 24 hr tablet Take 50 mg by mouth daily.   3  . Multiple Vitamin (MULTIVITAMIN) tablet Take 1 tablet by mouth daily.    . nitroGLYCERIN (NITROSTAT) 0.4 MG SL tablet PLACE 1 TABLET (0.4 MG TOTAL) UNDER THE TONGUE EVERY 5 (FIVE) MINUTES AS NEEDED FOR CHEST PAIN. 25 tablet 0    Allergies as of 12/08/2017 - Review Complete 12/08/2017  Allergen Reaction Noted  . Sertraline hcl Nausea Only 12/08/2017  . Biaxin [clarithromycin]  07/06/2013  . Celecoxib Other (See Comments)   . Ciprofloxacin  12/21/2016  . Codeine Other (See Comments)   . Hydromorphone  12/21/2016  . Lisinopril  07/06/2013  . Macrobid [nitrofurantoin monohyd macro] Other (See Comments) 12/28/2014  . Oxycodone hcl Other (See Comments)   . Prednisone  12/21/2016  . Sertraline hcl Other (See Comments)   . Penicillins Rash     Family History  Problem Relation Age of Onset  . Diabetes Mellitus I Mother   . CAD Mother   . CAD Father   . Diabetes Mellitus I Sister   . Diabetes Mellitus I Brother   . Diabetes Mellitus I Sister     Social History   Socioeconomic History  . Marital status: Divorced    Spouse name: Not on file  . Number of children: Not on file  . Years of education: Not on file  . Highest education level: Not on file  Occupational History  . Occupation: retired  Scientific laboratory technician  . Financial resource strain: Not on file  . Food insecurity:    Worry:  Not on file    Inability: Not on file  . Transportation needs:    Medical: Not on file    Non-medical: Not on file  Tobacco Use  . Smoking status: Never Smoker  . Smokeless tobacco: Never Used  Substance and Sexual  Activity  . Alcohol use: No  . Drug use: No  . Sexual activity: Not on file  Lifestyle  . Physical activity:    Days per week: Not on file    Minutes per session: Not on file  . Stress: Not on file  Relationships  . Social connections:    Talks on phone: Not on file    Gets together: Not on file    Attends religious service: Not on file    Active member of club or organization: Not on file    Attends meetings of clubs or organizations: Not on file    Relationship status: Not on file  . Intimate partner violence:    Fear of current or ex partner: Not on file    Emotionally abused: Not on file    Physically abused: Not on file    Forced sexual activity: Not on file  Other Topics Concern  . Not on file  Social History Narrative  . Not on file    Review of Systems: See history of present illness  Physical Exam: Vital signs in last 24 hours: Temp:  [98.1 F (36.7 C)-101.5 F (38.6 C)] 100 F (37.8 C) (11/27 1757) Pulse Rate:  [81-100] 92 (11/27 1930) Resp:  [15-21] 19 (11/27 1930) BP: (126-177)/(60-124) 154/69 (11/27 1930) SpO2:  [87 %-100 %] 100 % (11/27 1930) Weight:  [74.8 kg] 74.8 kg (11/27 1025)   General:   Alert,  Relaxed, Well-developed, well-nourished, pleasant and cooperative in NAD lying on the ER stretcher.  She does not appear to be in pain, certainly does not appear at all toxic. Head:  Normocephalic and atraumatic. Eyes:  Mild icterus.   Conjunctiva pink. Mouth:   No ulcerations or lesions.  Oropharynx dry. Neck:   No masses or thyromegaly. Lungs:  Clear throughout to auscultation.   No wheezes, crackles, or rhonchi. No evident respiratory distress. Heart:   Regular rate and rhythm; 3/6 aortic outflow murmur, no clicks, rubs,  or gallops. Abdomen: Moderate reproducible epigastric tenderness with some guarding, no peritoneal findings, no evident mass-effect. Msk:   Symmetrical without gross deformities. Extremities:   Without  edema. Neurologic:  Alert and  coherent;  grossly normal neurologically and appears to be cognitively very intact. Skin:  Intact without significant lesions or rashes. Cervical Nodes:  No significant cervical adenopathy. Psych:   Alert and cooperative. Normal mood and affect.  Intake/Output from previous day: No intake/output data recorded. Intake/Output this shift: No intake/output data recorded.  Lab Results: Recent Labs    12/08/17 1027  WBC 10.3  HGB 14.2  HCT 42.3  PLT 142*   BMET Recent Labs    12/08/17 1027  NA 138  K 3.3*  CL 98  CO2 26  GLUCOSE 136*  BUN 17  CREATININE 0.88  CALCIUM 9.4   LFT Recent Labs    12/08/17 1027  PROT 7.7  ALBUMIN 4.2  AST 927*  ALT 707*  ALKPHOS 185*  BILITOT 8.4*   PT/INR No results for input(s): LABPROT, INR in the last 72 hours.  Studies/Results: Dg Chest 2 View  Result Date: 12/08/2017 CLINICAL DATA:  Chest pain. Right upper quadrant pain. Nausea and vomiting. EXAM: CHEST - 2 VIEW  COMPARISON:  07/03/2017 FINDINGS: Sequelae of prior CABG and right lower lobectomy are again identified with volume loss in the right hemithorax. The cardiac silhouette remains mildly enlarged. Aortic atherosclerosis is noted. No airspace consolidation, edema, or pneumothorax is identified. There is chronic blunting of the right costophrenic angle which may reflect a small pleural effusion or pleural thickening, unchanged. No acute osseous abnormality is identified. IMPRESSION: Small chronic right pleural effusion or pleural thickening without evidence of acute airspace disease. Electronically Signed   By: Logan Bores M.D.   On: 12/08/2017 11:19   Ct Abdomen Pelvis W Contrast  Result Date: 12/08/2017 CLINICAL DATA:  84 year old female with nausea vomiting and right upper quadrant abdominal pain for 2 days. History of lung cancer. Dilated CBD and intrahepatic ducts on right upper quadrant ultrasound today. EXAM: CT ABDOMEN AND PELVIS WITH CONTRAST TECHNIQUE: Multidetector CT  imaging of the abdomen and pelvis was performed using the standard protocol following bolus administration of intravenous contrast. CONTRAST:  163m ISOVUE-300 IOPAMIDOL (ISOVUE-300) INJECTION 61% COMPARISON:  Right upper quadrant ultrasound 1150 hours today. CT Abdomen and Pelvis 12/21/2016. FINDINGS: Lower chest: Cardiomegaly and lung bases are stable since 2018, including a small layering right pleural effusion. Hepatobiliary: Intra- and extrahepatic biliary ductal dilatation. The CBD is up to 16 millimeters. In the distal CBD at the level of the pancreas there is a 12 millimeter rounded hyperdense filling defect with stippled fat density compatible with choledocholithiasis. See coronal image 36 and sagittal image 65. This is just proximal to the ampullary. Surgically absent gallbladder. Pancreas: Negative; no pancreatic inflammation or main pancreatic ductal dilatation. Spleen: Stable and negative; scattered small calcified granulomas. Adrenals/Urinary Tract: Normal adrenal glands. Bilateral renal enhancement and contrast excretion is symmetric and within normal limits. Extrarenal pelvis, normal variant. Decompressed ureters. The bladder is distended but otherwise unremarkable. Estimated bladder volume is 566 milliliters. Stomach/Bowel: Mild gaseous distension of the rectum. Decompressed and negative sigmoid colon, descending colon, redundant transverse colon. Negative right colon. The cecum is on a lax mesentery located anteriorly. The appendix is diminutive or absent. Negative terminal ileum. No dilated small bowel. Small gastric hiatal hernia suspected. Negative intraabdominal stomach. No abnormality of the duodenum. No free air, free fluid. Vascular/Lymphatic: Severe Aortoiliac calcified atherosclerosis. The major arterial structures in the abdomen and pelvis remain patent. Portal venous system is patent. No lymphadenopathy. Reproductive: Surgically absent. Other: No pelvic free fluid. Musculoskeletal: No  acute osseous abnormality identified. IMPRESSION: 1. Obstructing 12 mm choledocholithiasis suspected in the distal CBD. Intra- and extrahepatic biliary ductal dilatation. 2. No other acute or inflammatory process identified in the abdomen or pelvis. 3. Distended urinary bladder with estimated volume of 566 mL. 4. Small chronic right pleural effusion. 5.  Aortic Atherosclerosis (ICD10-I70.0). Electronically Signed   By: HGenevie AnnM.D.   On: 12/08/2017 12:36   [place]Abdomen Limited Ruq  Result Date: 12/08/2017 CLINICAL DATA:  84year old female with elevated LFTs. EXAM: ULTRASOUND ABDOMEN LIMITED RIGHT UPPER QUADRANT COMPARISON:  CT Abdomen and Pelvis today reported separately. CT Abdomen and Pelvis 12/21/2016. FINDINGS: Gallbladder: Surgically absent. Common bile duct: Diameter: Up to 17 millimeters (dilated) on image 5. However, the CBD was 14 millimeters in 2018. No obstructing lesion identified within the visible portion of the duct. Liver: Intrahepatic ductal dilatation (image 24). Liver echogenicity within normal limits. No discrete liver lesion. Portal vein is patent on color Doppler imaging with normal direction of blood flow towards the liver. Other findings: A small right pleural effusion is noted on image 42.  Negative visible right kidney. IMPRESSION: 1. Dilated CBD and intrahepatic biliary ductal dilatation, similar to but greater than that by CT in 2018 which appeared due to choledocholithiasis. 2. Small right pleural effusion. Electronically Signed   By: Genevie Ann M.D.   On: 12/08/2017 12:17    Impression: 1.  Documented choledocholithiasis, status post remote cholecystectomy 2.  Acute abdominal pain of approximately 36 hours duration, not improving 3.  Elevated liver chemistries, low-grade fever, history of chills all suggestive of cholangitis, without overt clinical sepsis or toxicity. 4.  History of coronary artery disease status post bypass grafting without active ongoing ischemic symptoms (did  have chest pain at [facility] ER, not clear if this was related to her common duct stone), troponins elevated, EKG (by my review) shows some anterior ST segment depression.  Plan: My recommendations are as follows, have discussed with ERCP physician Dr. Valarie Merino Mansouraty:  1.  IV antibiotics.  The choice of antibiotic is somewhat constrained by multiple allergies.  Although she reports a history of a rash to penicillin, cephalosporins would presumably be safe so Cefotan might be a reasonable choice.  2.  Pain medication  3.  Clear liquids tonight, n.p.o. after midnight in case ERCP needs to be done tomorrow.  4.  Repeat labs in a.m.  5.  Would recommend cardiology consultation for procedural clearance in anticipation of ERCP with general anesthesia sometime in the near future  6.  Hold Plavix for now--hopefully cardiology will be okay with her being off it for 5 days  6.  Depending on patient's clinical and biochemical evolution overnight and what the cardiologists say, it might be ideal to wait a day or 2 before doing ERCP.  If the patient is not doing well, however, we can proceed with ERCP at any time.   7.  If ERCP is performed in the next couple of days, as is likely, most likely it would be a temporizing procedure with stent placement, to allow the Plavix effect to fully get out of her system.  Thereafter, the patient would need definitive management with a subsequent ERCP for sphincterotomy and stone removal.   LOS: 0 days   Youlanda Mighty Violanda Bobeck  12/08/2017, 8:06 PM   Pager [phone] If no answer or after 5 PM call [phone]

## 2017-12-08 NOTE — H&P (Addendum)
History and Physical    [name] QMV:[id number] DOB: [birth year] DOA: 12/08/2017  PCP: Lajean Manes, MD  Patient coming from: Home.  Chief Complaint: Abdominal pain nausea vomiting.  HPI: [name] is a 84 y.o. female with history of CAD status post CABG, hypertension presents to the ER at [facility] with complaints of abdominal pain nausea vomiting.  Patient has been having these symptoms for last 2 days.  Pain is mostly epigastric area nonradiating increased on eating with no diarrhea.  Has subjective feeling of fever chills.  Denies any chest pain or shortness of breath.  ED Course: In the ER patient is noticed to have fever with markedly elevated LFTs.  CT abdomen pelvis done shows distal CBD stone and patient was referred to [facility] since patient will need ERCP.  Patient was evaluated by Dr. Cristina Gong on-call gastroenterologist admitted for further management of CBD stone with cholangitis.  EKG shows normal sinus rhythm with ST depression in the V4 V5.  Troponin mildly elevated.  Patient denies chest pain.  Review of Systems: As per HPI, rest all negative.   Past Medical History:  Diagnosis Date  . Anxiety   . Atypical carcinoid lung tumor ([place])   . CAD (coronary artery disease)    a. 1986: s/p PTCA of bifurcation lesion in LAD;  b. 2001: s/p BMS to LAD & LCX;  c. 04/2006 Cath: diff CAD w/o critical narrowing, nl EF;  d. 12/2014 MV: inflat ST dep, mild basal inf ischemia, EF 55-65%-->Low risk; e. 12/2016 Cath: LM 90d, LAD 90ost, 50p, 87m, LCX 80ost, 17m, RCA 90p, 60m/100-CTO w/ L->R collats filling RPDA.  . Cancer ([place])   . Carotid arterial disease ([place])    a. 08/2015 Carotid U/S: < 39% bilat ICA stenosis.  . Carotid disease, bilateral ([place])    a. RICA 2-84%, LICA 13-24%  . Cataracts, bilateral   . Cholelithiases   . Chronic diarrhea   . Depression   . Gilbert's syndrome   . History of echocardiogram    a. 06/2010 Echo: EF 60-65%, mild AI/MR; b. 10/2015 Echo: EF 60-65%,  no rwma, mild MR, nl RV fxn.  Marland Kitchen HTN (hypertension)   . Hyperlipidemia   . Impaired fasting blood sugar   . Keloid    mid sternum  . Macular degeneration   . Osteoarthritis   . Positional vertigo    benign  . S/P CABG x 3 12/23/2016   LIMA to LAD, SVG to OM, SVG to PDA, EVH via left thigh  . Symptoms, such as flushing, sleeplessness, headache, lack of concentration, associated with the menopause   . TIA (transient ischemic attack)     Past Surgical History:  Procedure Laterality Date  . ABDOMINAL AORTOGRAM N/A 12/22/2016   Procedure: ABDOMINAL AORTOGRAM;  Surgeon: Nelva Bush, MD;  Location: [place] [facility];  Service: Cardiovascular;  Laterality: N/A;  . ABDOMINAL HYSTERECTOMY    . CHOLECYSTECTOMY    . CORONARY ARTERY BYPASS GRAFT N/A 12/23/2016   Procedure: CORONARY ARTERY BYPASS GRAFTING (CABG) TIMES THREE USING LEFT INTERNAL MAMMARY ARTERY AND LEFT SAPHENOUS LEG VEIN HARVESTED ENDOSCOPICALLY,;  Surgeon: Rexene Alberts, MD;  Location: [place];  Service: Open Heart Surgery;  Laterality: N/A;  . IABP INSERTION N/A 12/22/2016   Procedure: IABP Insertion;  Surgeon: Nelva Bush, MD;  Location: [place] [facility];  Service: Cardiovascular;  Laterality: N/A;  . LEFT HEART CATH AND CORONARY ANGIOGRAPHY N/A 12/22/2016   Procedure: LEFT HEART CATH AND CORONARY  ANGIOGRAPHY;  Surgeon: Nelva Bush, MD;  Location: [place] [facility];  Service: Cardiovascular;  Laterality: N/A;  . LEFT HEART CATHETERIZATION WITH CORONARY ANGIOGRAM N/A 04/27/2012   Procedure: LEFT HEART CATHETERIZATION WITH CORONARY ANGIOGRAM;  Surgeon: Peter M Martinique, MD;  Location[facility];  Service: Cardiovascular;  Laterality: N/A;  . LOBECTOMY Right    RLL atypical carcinoid tumor - Dr Arlyce Dice  . pci  1986  . PCI of LAD  2001   with non des lad   . pci of mid cfx     with non des and cutting balloon pci of av circumflex  . TEE WITHOUT CARDIOVERSION N/A 12/23/2016   Procedure: TRANSESOPHAGEAL  ECHOCARDIOGRAM (TEE);  Surgeon: Rexene Alberts, MD;  Location: [place];  Service: Open Heart Surgery;  Laterality: N/A;     reports that she has never smoked. She has never used smokeless tobacco. She reports that she does not drink alcohol or use drugs.  Allergies  Allergen Reactions  . Sertraline Hcl Nausea Only  . Biaxin [Clarithromycin]     depressed  . Celecoxib Other (See Comments)    Reaction: Unknown  . Ciprofloxacin   . Codeine Other (See Comments)    Reaction: Unknown  . Hydromorphone   . Lisinopril     cough  . Macrobid [employer] Macro] Other (See Comments)    Reaction: Unknown  . Oxycodone Hcl Other (See Comments)    Reaction: Unknown  . Prednisone   . Sertraline Hcl Other (See Comments)    Reaction: Unknown  . Penicillins Rash    Reaction: Unknown    Family History  Problem Relation Age of Onset  . Diabetes Mellitus I Mother   . CAD Mother   . CAD Father   . Diabetes Mellitus I Sister   . Diabetes Mellitus I Brother   . Diabetes Mellitus I Sister     Prior to Admission medications   Medication Sig Start Date End Date Taking? Authorizing Provider  acetaminophen (TYLENOL) 325 MG tablet Take 2 tablets (650 mg total) by mouth every 6 (six) hours as needed for mild pain or headache. 01/02/17  Yes Barrett, Erin R, PA-C  amLODipine (NORVASC) 10 MG tablet Take 1 tablet (10 mg total) by mouth daily. Patient taking differently: Take 5 mg by mouth daily.  01/02/17  Yes Barrett, Erin R, PA-C  aspirin 81 MG tablet Take 81 mg by mouth daily.   Yes [provider]  atorvastatin (LIPITOR) 80 MG tablet Take 1 tablet (80 mg total) by mouth daily. 01/02/17  Yes Barrett, Erin R, PA-C  Calcium Carbonate-Vitamin D (CALCIUM 600 + D PO) Take 1 tablet by mouth daily. 600 mg/ 400 mg   Yes [provider]  clopidogrel (PLAVIX) 75 MG tablet Take 1 tablet (75 mg total) by mouth daily. 01/02/17  Yes Barrett, Erin R, PA-C  estrogens, conjugated,  (PREMARIN) 0.3 MG tablet Take 0.3 mg by mouth daily. Take daily for 21 days then do not take for 7 days.   Yes [provider]  furosemide (LASIX) 20 MG tablet Take 40 mg by mouth daily.    Yes [provider]  IRON PO Take 65 mg by mouth.   Yes [provider]  KLOR-CON M20 20 MEQ tablet TAKE 1 TABLET (20 MEQ TOTAL) BY MOUTH 2 (TWO) TIMES DAILY. 11/01/17  Yes Gollan, Kathlene November, MD  LORazepam (ATIVAN) 0.5 MG tablet Take 1 tablet (0.5 mg total) by mouth every 8 (eight) hours  as needed for anxiety. 01/02/17  Yes Barrett, Erin R, PA-C  losartan (COZAAR) 100 MG tablet Take 0.5 tablets (50 mg total) by mouth 2 (two) times daily. 08/03/17  Yes Minna Merritts, MD  metoprolol succinate (TOPROL-XL) 50 MG 24 hr tablet Take 50 mg by mouth daily.  07/31/17  Yes [provider]  Multiple Vitamin (MULTIVITAMIN) tablet Take 1 tablet by mouth daily.   Yes [provider]  nitroGLYCERIN (NITROSTAT) 0.4 MG SL tablet PLACE 1 TABLET (0.4 MG TOTAL) UNDER THE TONGUE EVERY 5 (FIVE) MINUTES AS NEEDED FOR CHEST PAIN. 01/04/17  Yes End, Harrell Gave, MD    Physical Exam: Vitals:   12/08/17 2015 12/08/17 2030 12/08/17 2056 12/08/17 2101  BP: (!) 142/59 (!) 143/83    Pulse: 87 75    Resp: 15 20    Temp:    99.7 F (37.6 C)  TempSrc:    Oral  SpO2: 98% 98%    Weight:   80.8 kg   Height:   5\' 6"  (1.676 m)       Constitutional: Moderately built and nourished. Vitals:   12/08/17 2015 12/08/17 2030 12/08/17 2056 12/08/17 2101  BP: (!) 142/59 (!) 143/83    Pulse: 87 75    Resp: 15 20    Temp:    99.7 F (37.6 C)  TempSrc:    Oral  SpO2: 98% 98%    Weight:   80.8 kg   Height:   5\' 6"  (1.676 m)    Eyes: Anicteric no pallor. ENMT: No discharge from the ears eyes nose or mouth. Neck: No mass felt.  No neck rigidity. Respiratory: No rhonchi or crepitations. Cardiovascular: S1-S2 heard no murmurs appreciated. Abdomen: Epigastric tenderness no guarding or rigidity no  rebound tenderness. Musculoskeletal: No edema. Skin: No rash. Neurologic: Alert awake oriented to time place and person.  Moves all extremities. Psychiatric: Appears normal.  Normal affect.   Labs on Admission: I have personally reviewed following labs and imaging studies  CBC: Recent Labs  Lab 12/08/17 1027  WBC 10.3  NEUTROABS 9.0*  HGB 14.2  HCT 42.3  MCV 89.8  PLT 419*   Basic Metabolic Panel: Recent Labs  Lab 12/08/17 1027  NA 138  K 3.3*  CL 98  CO2 26  GLUCOSE 136*  BUN 17  CREATININE 0.88  CALCIUM 9.4   GFR: Estimated Creatinine Clearance: 50.1 mL/min (by C-G formula based on SCr of 0.88 mg/dL). Liver Function Tests: Recent Labs  Lab 12/08/17 1027  AST 927*  ALT 707*  ALKPHOS 185*  BILITOT 8.4*  PROT 7.7  ALBUMIN 4.2   Recent Labs  Lab 12/08/17 1027  LIPASE 32   No results for input(s): AMMONIA in the last 168 hours. Coagulation Profile: No results for input(s): INR, PROTIME in the last 168 hours. Cardiac Enzymes: Recent Labs  Lab 12/08/17 1027  TROPONINI 0.25*   BNP (last 3 results) No results for input(s): PROBNP in the last 8760 hours. HbA1C: No results for input(s): HGBA1C in the last 72 hours. CBG: No results for input(s): GLUCAP in the last 168 hours. Lipid Profile: No results for input(s): CHOL, HDL, LDLCALC, TRIG, CHOLHDL, LDLDIRECT in the last 72 hours. Thyroid Function Tests: No results for input(s): TSH, T4TOTAL, FREET4, T3FREE, THYROIDAB in the last 72 hours. Anemia Panel: No results for input(s): VITAMINB12, FOLATE, FERRITIN, TIBC, IRON, RETICCTPCT in the last 72 hours. Urine analysis:    Component Value Date/Time   COLORURINE YELLOW (A) 12/08/2017 1134  APPEARANCEUR CLEAR (A) 12/08/2017 1134   APPEARANCEUR Clear 11/03/2013 1527   LABSPEC 1.010 12/08/2017 1134   LABSPEC 1.006 11/03/2013 1527   PHURINE 6.0 12/08/2017 1134   GLUCOSEU NEGATIVE 12/08/2017 1134   GLUCOSEU Negative 11/03/2013 1527   GLUCOSEU NEGATIVE  03/30/2011 1306   HGBUR NEGATIVE 12/08/2017 1134   BILIRUBINUR NEGATIVE 12/08/2017 1134   BILIRUBINUR Negative 11/03/2013 1527   KETONESUR NEGATIVE 12/08/2017 1134   PROTEINUR NEGATIVE 12/08/2017 1134   UROBILINOGEN 0.2 03/30/2011 1306   NITRITE NEGATIVE 12/08/2017 1134   LEUKOCYTESUR NEGATIVE 12/08/2017 1134   LEUKOCYTESUR Negative 11/03/2013 1527   Sepsis Labs: @LABRCNTIP (procalcitonin:4,lacticidven:4) )No results found for this or any previous visit (from the past 240 hour(s)).   Radiological Exams on Admission: Dg Chest 2 View  Result Date: 12/08/2017 CLINICAL DATA:  Chest pain. Right upper quadrant pain. Nausea and vomiting. EXAM: CHEST - 2 VIEW COMPARISON:  07/03/2017 FINDINGS: Sequelae of prior CABG and right lower lobectomy are again identified with volume loss in the right hemithorax. The cardiac silhouette remains mildly enlarged. Aortic atherosclerosis is noted. No airspace consolidation, edema, or pneumothorax is identified. There is chronic blunting of the right costophrenic angle which may reflect a small pleural effusion or pleural thickening, unchanged. No acute osseous abnormality is identified. IMPRESSION: Small chronic right pleural effusion or pleural thickening without evidence of acute airspace disease. Electronically Signed   By: Logan Bores M.D.   On: 12/08/2017 11:19   Ct Abdomen Pelvis W Contrast  Result Date: 12/08/2017 CLINICAL DATA:  84 year old female with nausea vomiting and right upper quadrant abdominal pain for 2 days. History of lung cancer. Dilated CBD and intrahepatic ducts on right upper quadrant ultrasound today. EXAM: CT ABDOMEN AND PELVIS WITH CONTRAST TECHNIQUE: Multidetector CT imaging of the abdomen and pelvis was performed using the standard protocol following bolus administration of intravenous contrast. CONTRAST:  120mL ISOVUE-300 IOPAMIDOL (ISOVUE-300) INJECTION 61% COMPARISON:  Right upper quadrant ultrasound 1150 hours today. CT Abdomen and  Pelvis 12/21/2016. FINDINGS: Lower chest: Cardiomegaly and lung bases are stable since 2018, including a small layering right pleural effusion. Hepatobiliary: Intra- and extrahepatic biliary ductal dilatation. The CBD is up to 16 millimeters. In the distal CBD at the level of the pancreas there is a 12 millimeter rounded hyperdense filling defect with stippled fat density compatible with choledocholithiasis. See coronal image 36 and sagittal image 65. This is just proximal to the ampullary. Surgically absent gallbladder. Pancreas: Negative; no pancreatic inflammation or main pancreatic ductal dilatation. Spleen: Stable and negative; scattered small calcified granulomas. Adrenals/Urinary Tract: Normal adrenal glands. Bilateral renal enhancement and contrast excretion is symmetric and within normal limits. Extrarenal pelvis, normal variant. Decompressed ureters. The bladder is distended but otherwise unremarkable. Estimated bladder volume is 566 milliliters. Stomach/Bowel: Mild gaseous distension of the rectum. Decompressed and negative sigmoid colon, descending colon, redundant transverse colon. Negative right colon. The cecum is on a lax mesentery located anteriorly. The appendix is diminutive or absent. Negative terminal ileum. No dilated small bowel. Small gastric hiatal hernia suspected. Negative intraabdominal stomach. No abnormality of the duodenum. No free air, free fluid. Vascular/Lymphatic: Severe Aortoiliac calcified atherosclerosis. The major arterial structures in the abdomen and pelvis remain patent. Portal venous system is patent. No lymphadenopathy. Reproductive: Surgically absent. Other: No pelvic free fluid. Musculoskeletal: No acute osseous abnormality identified. IMPRESSION: 1. Obstructing 12 mm choledocholithiasis suspected in the distal CBD. Intra- and extrahepatic biliary ductal dilatation. 2. No other acute or inflammatory process identified in the abdomen or pelvis. 3. Distended  urinary  bladder with estimated volume of 566 mL. 4. Small chronic right pleural effusion. 5.  Aortic Atherosclerosis (ICD10-I70.0). Electronically Signed   By: Genevie Ann M.D.   On: 12/08/2017 12:36   US Abdomen Limited Ruq  Result Date: 12/08/2017 CLINICAL DATA:  84 year old female with elevated LFTs. EXAM: ULTRASOUND ABDOMEN LIMITED RIGHT UPPER QUADRANT COMPARISON:  CT Abdomen and Pelvis today reported separately. CT Abdomen and Pelvis 12/21/2016. FINDINGS: Gallbladder: Surgically absent. Common bile duct: Diameter: Up to 17 millimeters (dilated) on image 5. However, the CBD was 14 millimeters in 2018. No obstructing lesion identified within the visible portion of the duct. Liver: Intrahepatic ductal dilatation (image 24). Liver echogenicity within normal limits. No discrete liver lesion. Portal vein is patent on color Doppler imaging with normal direction of blood flow towards the liver. Other findings: A small right pleural effusion is noted on image 42. Negative visible right kidney. IMPRESSION: 1. Dilated CBD and intrahepatic biliary ductal dilatation, similar to but greater than that by CT in 2018 which appeared due to choledocholithiasis. 2. Small right pleural effusion. Electronically Signed   By: Genevie Ann M.D.   On: 12/08/2017 12:17    EKG: Independently reviewed.  Normal sinus rhythm with ST depression in the lateral leads.  Assessment/Plan Principal Problem:   Acute cholangitis Active Problems:   Essential hypertension   PVC's (premature ventricular contractions)   S/P CABG x 3   Elevated troponin   Choledocholithiasis    1. Acute cholangitis with CBD stone -appreciate gastroenterology consult.  Patient has had previous cholecystectomy.  Patient is placed on empiric antibiotics and kept n.p.o.  Follow LFTs.  Hold Plavix for now in anticipation of possible intervention for CBD stone. 2. Non-ST relation MI -discussed with on-call cardiologist.  Since troponin is showing increasing trend which  could be secondary to sepsis but does show some EKG changes cardiologist advised to keep patient on heparin.  If blood pressure allows we will keep patient on IV metoprolol.  Cycle cardiac markers.  Cardiology to see patient in consult. 3. Hypertension we will keep patient on PRN IV hydralazine and scheduled dose of IV metoprolol. 4. Thrombocytopenia could be from infectious cause.  Follow CBC.   DVT prophylaxis: Heparin. Code Status: Full code. Family Communication: Discussed with patient. Disposition Plan: Home. Consults called: Gastroenterology and cardiology. Admission status: Inpatient.   Rise Patience MD [facility] Pager [phone].  If 7PM-7AM, please contact night-coverage [email or web address] Password [email or web address]  12/08/2017, 10:53 PM

## 2017-12-09 DIAGNOSIS — R7989 Other specified abnormal findings of blood chemistry: Secondary | ICD-10-CM

## 2017-12-09 DIAGNOSIS — E7800 Pure hypercholesterolemia, unspecified: Secondary | ICD-10-CM

## 2017-12-09 DIAGNOSIS — K8309 Other cholangitis: Secondary | ICD-10-CM

## 2017-12-09 DIAGNOSIS — I10 Essential (primary) hypertension: Secondary | ICD-10-CM

## 2017-12-09 DIAGNOSIS — I2510 Atherosclerotic heart disease of native coronary artery without angina pectoris: Secondary | ICD-10-CM

## 2017-12-09 LAB — BASIC METABOLIC PANEL
Anion gap: 7 (ref 5–15)
BUN: 13 mg/dL (ref 8–23)
CALCIUM: 8.1 mg/dL — AB (ref 8.9–10.3)
CO2: 26 mmol/L (ref 22–32)
CREATININE: 1.09 mg/dL — AB (ref 0.44–1.00)
Chloride: 105 mmol/L (ref 98–111)
GFR calc Af Amer: 54 mL/min — ABNORMAL LOW (ref >60)
GFR, EST NON AFRICAN AMERICAN: 46 mL/min — AB (ref >60)
GLUCOSE: 129 mg/dL — AB (ref 70–99)
Potassium: 2.9 mmol/L — ABNORMAL LOW (ref 3.5–5.1)
Sodium: 138 mmol/L (ref 135–145)

## 2017-12-09 LAB — CBC
HCT: 35.8 % — ABNORMAL LOW (ref 36.0–46.0)
HCT: 35.5 % — ABNORMAL LOW (ref 36.0–46.0)
HEMOGLOBIN: 11.6 g/dL — AB (ref 12.0–15.0)
Hemoglobin: 11.6 g/dL — ABNORMAL LOW (ref 12.0–15.0)
MCH: 29.7 pg (ref 26.0–34.0)
MCH: 29.6 pg (ref 26.0–34.0)
MCHC: 32.4 g/dL (ref 30.0–36.0)
MCHC: 32.7 g/dL (ref 30.0–36.0)
MCV: 91.6 fL (ref 80.0–100.0)
MCV: 90.6 fL (ref 80.0–100.0)
NRBC: 0 % (ref 0.0–0.2)
PLATELETS: 107 10*3/uL — AB (ref 150–400)
Platelets: 100 10*3/uL — ABNORMAL LOW (ref 150–400)
RBC: 3.91 MIL/uL (ref 3.87–5.11)
RBC: 3.92 MIL/uL (ref 3.87–5.11)
RDW: 13.4 % (ref 11.5–15.5)
RDW: 13.2 % (ref 11.5–15.5)
WBC: 9.2 10*3/uL (ref 4.0–10.5)
WBC: 8.1 10*3/uL (ref 4.0–10.5)
nRBC: 0 % (ref 0.0–0.2)

## 2017-12-09 LAB — BLOOD CULTURE ID PANEL (REFLEXED)
ACINETOBACTER BAUMANNII: NOT DETECTED
CANDIDA ALBICANS: NOT DETECTED
CANDIDA GLABRATA: NOT DETECTED
CANDIDA KRUSEI: NOT DETECTED
CANDIDA PARAPSILOSIS: NOT DETECTED
CANDIDA TROPICALIS: NOT DETECTED
Carbapenem resistance: NOT DETECTED
ENTEROBACTER CLOACAE COMPLEX: NOT DETECTED
ESCHERICHIA COLI: NOT DETECTED
Enterobacteriaceae species: DETECTED — AB
Enterococcus species: NOT DETECTED
Haemophilus influenzae: NOT DETECTED
KLEBSIELLA OXYTOCA: DETECTED — AB
KLEBSIELLA PNEUMONIAE: NOT DETECTED
Listeria monocytogenes: NOT DETECTED
NEISSERIA MENINGITIDIS: NOT DETECTED
PROTEUS SPECIES: NOT DETECTED
Pseudomonas aeruginosa: NOT DETECTED
SERRATIA MARCESCENS: NOT DETECTED
STAPHYLOCOCCUS SPECIES: NOT DETECTED
Staphylococcus aureus (BCID): NOT DETECTED
Streptococcus agalactiae: NOT DETECTED
Streptococcus pneumoniae: NOT DETECTED
Streptococcus pyogenes: NOT DETECTED
Streptococcus species: NOT DETECTED

## 2017-12-09 LAB — COMPREHENSIVE METABOLIC PANEL
ALBUMIN: 2.8 g/dL — AB (ref 3.5–5.0)
ALT: 343 U/L — AB (ref 0–44)
AST: 280 U/L — AB (ref 15–41)
Alkaline Phosphatase: 124 U/L (ref 38–126)
Anion gap: 7 (ref 5–15)
BUN: 14 mg/dL (ref 8–23)
CALCIUM: 8.3 mg/dL — AB (ref 8.9–10.3)
CHLORIDE: 105 mmol/L (ref 98–111)
CO2: 28 mmol/L (ref 22–32)
CREATININE: 1.04 mg/dL — AB (ref 0.44–1.00)
GFR calc Af Amer: 57 mL/min — ABNORMAL LOW (ref >60)
GFR, EST NON AFRICAN AMERICAN: 49 mL/min — AB (ref >60)
Glucose, Bld: 116 mg/dL — ABNORMAL HIGH (ref 70–99)
Potassium: 3 mmol/L — ABNORMAL LOW (ref 3.5–5.1)
SODIUM: 140 mmol/L (ref 135–145)
TOTAL PROTEIN: 5.8 g/dL — AB (ref 6.5–8.1)
Total Bilirubin: 5.7 mg/dL — ABNORMAL HIGH (ref 0.3–1.2)

## 2017-12-09 LAB — HEPATITIS PANEL, ACUTE
HEP A IGM: NEGATIVE
Hep B C IgM: NEGATIVE
Hepatitis B Surface Ag: NEGATIVE

## 2017-12-09 LAB — LIPASE, BLOOD: LIPASE: 27 U/L (ref 11–51)

## 2017-12-09 LAB — PROTIME-INR
INR: 1.27
INR: 1.48
PROTHROMBIN TIME: 17.8 s — AB (ref 11.4–15.2)
Prothrombin Time: 15.7 seconds — ABNORMAL HIGH (ref 11.4–15.2)

## 2017-12-09 LAB — HEPARIN LEVEL (UNFRACTIONATED): HEPARIN UNFRACTIONATED: 0.54 [IU]/mL (ref 0.30–0.70)

## 2017-12-09 LAB — HEPATIC FUNCTION PANEL
ALT: 371 U/L — ABNORMAL HIGH (ref 0–44)
AST: 333 U/L — AB (ref 15–41)
Albumin: 2.7 g/dL — ABNORMAL LOW (ref 3.5–5.0)
Alkaline Phosphatase: 121 U/L (ref 38–126)
BILIRUBIN DIRECT: 3 mg/dL — AB (ref 0.0–0.2)
BILIRUBIN INDIRECT: 2.3 mg/dL — AB (ref 0.3–0.9)
BILIRUBIN TOTAL: 5.3 mg/dL — AB (ref 0.3–1.2)
Total Protein: 5.3 g/dL — ABNORMAL LOW (ref 6.5–8.1)

## 2017-12-09 LAB — TROPONIN I
Troponin I: 1.1 ng/mL (ref <0.03)
Troponin I: 1.17 ng/mL (ref <0.03)

## 2017-12-09 MED ORDER — HEPARIN BOLUS VIA INFUSION
3000.0000 [IU] | Freq: Once | INTRAVENOUS | Status: AC
  Administered 2017-12-09: 3000 [IU] via INTRAVENOUS
  Filled 2017-12-09: qty 3000

## 2017-12-09 MED ORDER — POTASSIUM CHLORIDE CRYS ER 20 MEQ PO TBCR
40.0000 meq | EXTENDED_RELEASE_TABLET | Freq: Once | ORAL | Status: AC
  Administered 2017-12-09: 40 meq via ORAL
  Filled 2017-12-09: qty 2

## 2017-12-09 MED ORDER — HEPARIN (PORCINE) 25000 UT/250ML-% IV SOLN
1000.0000 [IU]/h | INTRAVENOUS | Status: DC
  Administered 2017-12-09: 1000 [IU]/h via INTRAVENOUS
  Filled 2017-12-09 (×2): qty 250

## 2017-12-09 MED ORDER — SODIUM CHLORIDE 0.9 % IV SOLN
INTRAVENOUS | Status: DC
  Administered 2017-12-09: 21:00:00 via INTRAVENOUS

## 2017-12-09 MED ORDER — POTASSIUM CHLORIDE CRYS ER 20 MEQ PO TBCR
20.0000 meq | EXTENDED_RELEASE_TABLET | Freq: Once | ORAL | Status: AC
  Administered 2017-12-09: 20 meq via ORAL
  Filled 2017-12-09: qty 1

## 2017-12-09 MED ORDER — LACTATED RINGERS IV SOLN
INTRAVENOUS | Status: AC
  Administered 2017-12-09 – 2017-12-10 (×2): via INTRAVENOUS

## 2017-12-09 MED ORDER — LORAZEPAM 0.5 MG PO TABS
0.5000 mg | ORAL_TABLET | Freq: Four times a day (QID) | ORAL | Status: DC | PRN
  Administered 2017-12-10 – 2017-12-11 (×4): 0.5 mg via ORAL
  Filled 2017-12-09 (×4): qty 1

## 2017-12-09 MED ORDER — MORPHINE SULFATE (PF) 2 MG/ML IV SOLN
2.0000 mg | INTRAVENOUS | Status: DC | PRN
  Administered 2017-12-09 – 2017-12-11 (×4): 2 mg via INTRAVENOUS
  Filled 2017-12-09 (×4): qty 1

## 2017-12-09 MED ORDER — MAGNESIUM SULFATE IN D5W 1-5 GM/100ML-% IV SOLN
1.0000 g | Freq: Once | INTRAVENOUS | Status: AC
  Administered 2017-12-09: 1 g via INTRAVENOUS
  Filled 2017-12-09: qty 100

## 2017-12-09 MED ORDER — LORAZEPAM 2 MG/ML IJ SOLN
0.5000 mg | Freq: Four times a day (QID) | INTRAMUSCULAR | Status: AC | PRN
  Administered 2017-12-09: 0.5 mg via INTRAVENOUS
  Filled 2017-12-09: qty 1

## 2017-12-09 NOTE — Progress Notes (Signed)
ANTICOAGULATION CONSULT NOTE - Initial Consult  Pharmacy Consult for Heparin Indication: chest pain/ACS  Allergies  Allergen Reactions  . Sertraline Hcl Nausea Only  . Biaxin [Clarithromycin]     depressed  . Celecoxib Other (See Comments)    Reaction: Unknown  . Ciprofloxacin   . Codeine Other (See Comments)    Reaction: Unknown  . Hydromorphone   . Lisinopril     cough  . Macrobid [employer] Macro] Other (See Comments)    Reaction: Unknown  . Oxycodone Hcl Other (See Comments)    Reaction: Unknown  . Prednisone   . Sertraline Hcl Other (See Comments)    Reaction: Unknown  . Penicillins Rash    Reaction: Unknown    Patient Measurements: Height: 5\' 6"  (167.6 cm) Weight: 178 lb 2.1 oz (80.8 kg) IBW/kg (Calculated) : 59.3  Vital Signs: Temp: 98.3 F (36.8 C) (11/28 0027) Temp Source: Oral (11/28 0027) BP: 137/61 (11/28 0100) Pulse Rate: 81 (11/28 0100)  Labs: Recent Labs    12/08/17 1027 12/08/17 2323  HGB 14.2  --   HCT 42.3  --   PLT 142*  --   CREATININE 0.88  --   TROPONINI 0.25* 1.10*    Estimated Creatinine Clearance: 50.1 mL/min (by C-G formula based on SCr of 0.88 mg/dL).   Medical History: Past Medical History:  Diagnosis Date  . Anxiety   . Atypical carcinoid lung tumor ([place])   . CAD (coronary artery disease)    a. 1986: s/p PTCA of bifurcation lesion in LAD;  b. 2001: s/p BMS to LAD & LCX;  c. 04/2006 Cath: diff CAD w/o critical narrowing, nl EF;  d. 12/2014 MV: inflat ST dep, mild basal inf ischemia, EF 55-65%-->Low risk; e. 12/2016 Cath: LM 90d, LAD 90ost, 50p, 88m, LCX 80ost, 72m, RCA 90p, 35m/100-CTO w/ L->R collats filling RPDA.  . Cancer ([place])   . Carotid arterial disease ([place])    a. 08/2015 Carotid U/S: < 39% bilat ICA stenosis.  . Carotid disease, bilateral ([place])    a. RICA 1-21%, LICA 97-58%  . Cataracts, bilateral   . Cholelithiases   . Chronic diarrhea   . Depression   . Gilbert's syndrome   . History of  echocardiogram    a. 06/2010 Echo: EF 60-65%, mild AI/MR; b. 10/2015 Echo: EF 60-65%, no rwma, mild MR, nl RV fxn.  Marland Kitchen HTN (hypertension)   . Hyperlipidemia   . Impaired fasting blood sugar   . Keloid    mid sternum  . Macular degeneration   . Osteoarthritis   . Positional vertigo    benign  . S/P CABG x 3 12/23/2016   LIMA to LAD, SVG to OM, SVG to PDA, EVH via left thigh  . Symptoms, such as flushing, sleeplessness, headache, lack of concentration, associated with the menopause   . TIA (transient ischemic attack)     Medications:  Medications Prior to Admission  Medication Sig Dispense Refill Last Dose  . acetaminophen (TYLENOL) 325 MG tablet Take 2 tablets (650 mg total) by mouth every 6 (six) hours as needed for mild pain or headache.   12/08/2017 at Unknown time  . amLODipine (NORVASC) 10 MG tablet Take 1 tablet (10 mg total) by mouth daily. (Patient taking differently: Take 5 mg by mouth daily. )   12/08/2017 at Unknown time  . aspirin 81 MG tablet Take 81 mg by mouth daily.   12/08/2017 at Unknown time  . atorvastatin (LIPITOR) 80 MG tablet Take 1 tablet (  80 mg total) by mouth daily.   12/08/2017 at Unknown time  . Calcium Carbonate-Vitamin D (CALCIUM 600 + D PO) Take 1 tablet by mouth daily. 600 mg/ 400 mg   12/08/2017 at Unknown time  . clopidogrel (PLAVIX) 75 MG tablet Take 1 tablet (75 mg total) by mouth daily.   12/08/2017 at 6am  . estrogens, conjugated, (PREMARIN) 0.3 MG tablet Take 0.3 mg by mouth daily. Take daily for 21 days then do not take for 7 days.   12/08/2017 at Unknown time  . furosemide (LASIX) 20 MG tablet Take 40 mg by mouth daily.    12/08/2017 at Unknown time  . IRON PO Take 65 mg by mouth.   12/08/2017 at Unknown time  . KLOR-CON M20 20 MEQ tablet TAKE 1 TABLET (20 MEQ TOTAL) BY MOUTH 2 (TWO) TIMES DAILY. 180 tablet 3 12/08/2017 at am  . LORazepam (ATIVAN) 0.5 MG tablet Take 1 tablet (0.5 mg total) by mouth every 8 (eight) hours as needed for anxiety. 30  tablet 0 unk at prn  . losartan (COZAAR) 100 MG tablet Take 0.5 tablets (50 mg total) by mouth 2 (two) times daily. 90 tablet 3 12/08/2017 at am  . metoprolol succinate (TOPROL-XL) 50 MG 24 hr tablet Take 50 mg by mouth daily.   3 12/08/2017 at 6am  . Multiple Vitamin (MULTIVITAMIN) tablet Take 1 tablet by mouth daily.   12/08/2017 at Unknown time  . nitroGLYCERIN (NITROSTAT) 0.4 MG SL tablet PLACE 1 TABLET (0.4 MG TOTAL) UNDER THE TONGUE EVERY 5 (FIVE) MINUTES AS NEEDED FOR CHEST PAIN. 25 tablet 0 unk at prn    Assessment: 84 y.o. female with choleangitis, now with elevated cardiac markers, for heparin Goal of Therapy:  Heparin level 0.3-0.7 units/ml Monitor platelets by anticoagulation protocol: Yes   Plan:  Heparin 3000 units IV bolus, then start heparin 1000 units/hr Check heparin level in 6 hours.   Ehab Humber, Bronson Curb 12/09/2017,1:47 AM

## 2017-12-09 NOTE — Consult Note (Signed)
Admit date: 12/08/2017 Referring Physician  Dr. Candiss Norse Primary Physician  Dr. Lajean Manes Primary Cardiologist  Dr.Timothy Rockey Situ Reason for Consultation  CP with elevated trop  HPI: [name] is a 84 y.o. female who is being seen today for the evaluation of CP and elevated troponin at the request of Dr. Candiss Norse.  This is an 84 year old female with a history of a SCID status post PCI to LAD and left circumflex in 2001 with bare-metal stents.  Repeat cardiac cath in April 2014 showed an occluded RCA with a patent LAD and left circumflex stents.  She had recurrent chest pain in 11/2016 and underwent three-vessel CABG (LIMA>LAD, SVG>PDA, SVG>OM) after cath showed critical left main disease with severe three-vessel CAD.  She had postoperative atrial fibrillation treated with amiodarone.  She also has a history of <50% left ICA stenosis.  She has a history of hypertension and PVCs as well as hyperlipidemia.  In review of Dr. Donivan Scull last office visit note she has had some rare episodes of chest pain that wake her up at night which significantly improved after initiation of Imdur 15 mg daily.  At last office visit she was not having any chest pain.  She had had some problems with hypertension and losartan was increased to 100 mg daily and Lopressor changed from daily to twice daily.  She presented to [facility] ER yesterday with complaints of abdominal pain, nausea and vomiting.  She been having these symptoms for the past 2 days.  The pain is epigastric and does not radiate and is worse with eating.  She has not had any diarrhea but has had some fever and chills.  She denies any anginal chest pain and no exertional shortness of breath.  She was noted to have markedly elevated LFTs in the ER and a CT of the abdomen showed a distal common bile duct stone.  She was transferred to [facility] emergency room for admission for ERCP with a diagnosis of common bile duct stone with cholangitis.  She was also noted to have  a mildly elevated troponin and cardiology is now asked to consult.      PMH:   Past Medical History:  Diagnosis Date  . Anxiety   . Atypical carcinoid lung tumor ([place])   . CAD (coronary artery disease)    a. 1986: s/p PTCA of bifurcation lesion in LAD;  b. 2001: s/p BMS to LAD & LCX;  c. 04/2006 Cath: diff CAD w/o critical narrowing, nl EF;  d. 12/2014 MV: inflat ST dep, mild basal inf ischemia, EF 55-65%-->Low risk; e. 12/2016 Cath: LM 90d, LAD 90ost, 50p, 66m, LCX 80ost, 56m, RCA 90p, 43m/100-CTO w/ L->R collats filling RPDA.  . Cancer ([place])   . Carotid arterial disease ([place])    a. 08/2015 Carotid U/S: < 39% bilat ICA stenosis.  . Carotid disease, bilateral ([place])    a. RICA 7-62%, LICA 26-33%  . Cataracts, bilateral   . Cholelithiases   . Chronic diarrhea   . Depression   . Gilbert's syndrome   . History of echocardiogram    a. 06/2010 Echo: EF 60-65%, mild AI/MR; b. 10/2015 Echo: EF 60-65%, no rwma, mild MR, nl RV fxn.  Marland Kitchen HTN (hypertension)   . Hyperlipidemia   . Impaired fasting blood sugar   . Keloid    mid sternum  . Macular degeneration   . Osteoarthritis   . Positional vertigo    benign  . S/P CABG x 3 12/23/2016  LIMA to LAD, SVG to OM, SVG to PDA, EVH via left thigh  . Symptoms, such as flushing, sleeplessness, headache, lack of concentration, associated with the menopause   . TIA (transient ischemic attack)      PSH:   Past Surgical History:  Procedure Laterality Date  . ABDOMINAL AORTOGRAM N/A 12/22/2016   Procedure: ABDOMINAL AORTOGRAM;  Surgeon: Nelva Bush, MD;  Location: [place] [facility];  Service: Cardiovascular;  Laterality: N/A;  . ABDOMINAL HYSTERECTOMY    . CHOLECYSTECTOMY    . CORONARY ARTERY BYPASS GRAFT N/A 12/23/2016   Procedure: CORONARY ARTERY BYPASS GRAFTING (CABG) TIMES THREE USING LEFT INTERNAL MAMMARY ARTERY AND LEFT SAPHENOUS LEG VEIN HARVESTED ENDOSCOPICALLY,;  Surgeon: Rexene Alberts, MD;  Location: [place];  Service: Open Heart  Surgery;  Laterality: N/A;  . IABP INSERTION N/A 12/22/2016   Procedure: IABP Insertion;  Surgeon: Nelva Bush, MD;  Location: [place] [facility];  Service: Cardiovascular;  Laterality: N/A;  . LEFT HEART CATH AND CORONARY ANGIOGRAPHY N/A 12/22/2016   Procedure: LEFT HEART CATH AND CORONARY ANGIOGRAPHY;  Surgeon: Nelva Bush, MD;  Location: [place];  Service: Cardiovascular;  Laterality: N/A;  . LEFT HEART CATHETERIZATION WITH CORONARY ANGIOGRAM N/A 04/27/2012   Procedure: LEFT HEART CATHETERIZATION WITH CORONARY ANGIOGRAM;  Surgeon: Peter M Martinique, MD;  Location: [facility] C[facility] LAB;  Service: Cardiovascular;  Laterality: N/A;  . LOBECTOMY Right    RLL atypical carcinoid tumor - Dr Arlyce Dice  . pci  1986  . PCI of LAD  2001   with non des lad   . pci of mid cfx     with non des and cutting balloon pci of av circumflex  . TEE WITHOUT CARDIOVERSION N/A 12/23/2016   Procedure: TRANSESOPHAGEAL ECHOCARDIOGRAM (TEE);  Surgeon: Rexene Alberts, MD;  Location: [place];  Service: Open Heart Surgery;  Laterality: N/A;    Allergies:  Sertraline hcl; Biaxin [clarithromycin]; Celecoxib; Ciprofloxacin; Codeine; Hydromorphone; Lisinopril; Macrobid [nitrofurantoin monohyd macro]; Oxycodone hcl; Prednisone; Sertraline hcl; and Penicillins Prior to Admit Meds:   Medications Prior to Admission  Medication Sig Dispense Refill Last Dose  . acetaminophen (TYLENOL) 325 MG tablet Take 2 tablets (650 mg total) by mouth every 6 (six) hours as needed for mild pain or headache.   12/08/2017 at Unknown time  . amLODipine (NORVASC) 10 MG tablet Take 1 tablet (10 mg total) by mouth daily. (Patient taking differently: Take 5 mg by mouth daily. )   12/08/2017 at Unknown time  . aspirin 81 MG tablet Take 81 mg by mouth daily.   12/08/2017 at Unknown time  . atorvastatin (LIPITOR) 80 MG tablet Take 1 tablet (80 mg total) by mouth daily.   12/08/2017 at Unknown time  . Calcium Carbonate-Vitamin D (CALCIUM 600 + D  PO) Take 1 tablet by mouth daily. 600 mg/ 400 mg   12/08/2017 at Unknown time  . clopidogrel (PLAVIX) 75 MG tablet Take 1 tablet (75 mg total) by mouth daily.   12/08/2017 at 6am  . estrogens, conjugated, (PREMARIN) 0.3 MG tablet Take 0.3 mg by mouth daily. Take daily for 21 days then do not take for 7 days.   12/08/2017 at Unknown time  . furosemide (LASIX) 20 MG tablet Take 40 mg by mouth daily.    12/08/2017 at Unknown time  . IRON PO Take 65 mg by mouth.   12/08/2017 at Unknown time  . KLOR-CON M20 20 MEQ tablet TAKE 1 TABLET (20 MEQ TOTAL) BY MOUTH 2 (TWO)  TIMES DAILY. 180 tablet 3 12/08/2017 at am  . LORazepam (ATIVAN) 0.5 MG tablet Take 1 tablet (0.5 mg total) by mouth every 8 (eight) hours as needed for anxiety. 30 tablet 0 unk at prn  . losartan (COZAAR) 100 MG tablet Take 0.5 tablets (50 mg total) by mouth 2 (two) times daily. 90 tablet 3 12/08/2017 at am  . metoprolol succinate (TOPROL-XL) 50 MG 24 hr tablet Take 50 mg by mouth daily.   3 12/08/2017 at 6am  . Multiple Vitamin (MULTIVITAMIN) tablet Take 1 tablet by mouth daily.   12/08/2017 at Unknown time  . nitroGLYCERIN (NITROSTAT) 0.4 MG SL tablet PLACE 1 TABLET (0.4 MG TOTAL) UNDER THE TONGUE EVERY 5 (FIVE) MINUTES AS NEEDED FOR CHEST PAIN. 25 tablet 0 unk at prn   Fam HX:    Family History  Problem Relation Age of Onset  . Diabetes Mellitus I Mother   . CAD Mother   . CAD Father   . Diabetes Mellitus I Sister   . Diabetes Mellitus I Brother   . Diabetes Mellitus I Sister    Social HX:    Social History   Socioeconomic History  . Marital status: Divorced    Spouse name: Not on file  . Number of children: Not on file  . Years of education: Not on file  . Highest education level: Not on file  Occupational History  . Occupation: retired  Scientific laboratory technician  . Financial resource strain: Not on file  . Food insecurity:    Worry: Not on file    Inability: Not on file  . Transportation needs:    Medical: Not on file     Non-medical: Not on file  Tobacco Use  . Smoking status: Never Smoker  . Smokeless tobacco: Never Used  Substance and Sexual Activity  . Alcohol use: No  . Drug use: No  . Sexual activity: Not on file  Lifestyle  . Physical activity:    Days per week: Not on file    Minutes per session: Not on file  . Stress: Not on file  Relationships  . Social connections:    Talks on phone: Not on file    Gets together: Not on file    Attends religious service: Not on file    Active member of club or organization: Not on file    Attends meetings of clubs or organizations: Not on file    Relationship status: Not on file  . Intimate partner violence:    Fear of current or ex partner: Not on file    Emotionally abused: Not on file    Physically abused: Not on file    Forced sexual activity: Not on file  Other Topics Concern  . Not on file  Social History Narrative  . Not on file     ROS:  All  ROS were addressed and are negative except what is stated in the HPI  Physical Exam: Blood pressure (!) 156/74, pulse 78, temperature 99.5 F (37.5 C), temperature source Oral, resp. rate 14, height 5\' 6"  (1.676 m), weight 80.8 kg, SpO2 95 %.    General: Well developed, well nourished, in no acute distress Head: Eyes PERRLA, No xanthomas.   Normal cephalic and atramatic  Lungs:   Clear bilaterally to auscultation and percussion. Heart:   HRRR S1 S2 Pulses are 2+ & equal.            No carotid bruit. No JVD.  No abdominal bruits. No  femoral bruits. Abdomen: Bowel sounds are positive, abdomen soft and non-tender without masses or                  Hernia's noted. Msk:  Back normal, normal gait. Normal strength and tone for age. Extremities:   No clubbing, cyanosis or edema.  DP +1 Neuro: Alert and oriented X 3. Psych:  Good affect, responds appropriately    Labs:   Lab Results  Component Value Date   WBC 9.2 12/09/2017   HGB 11.6 (L) 12/09/2017   HCT 35.8 (L) 12/09/2017   MCV 91.6  12/09/2017   PLT 107 (L) 12/09/2017    Recent Labs  Lab 12/09/17 0350  NA 138  K 2.9*  CL 105  CO2 26  BUN 13  CREATININE 1.09*  CALCIUM 8.1*  PROT 5.3*  BILITOT 5.3*  ALKPHOS 121  ALT 371*  AST 333*  GLUCOSE 129*   No results found for: PTT Lab Results  Component Value Date   INR 1.48 12/09/2017   INR 1.27 12/09/2017   INR 1.74 12/23/2016   Lab Results  Component Value Date   CKTOTAL 73 03/01/2012   TROPONINI 1.17 (HH) 12/09/2017     Lab Results  Component Value Date   CHOL 121 12/22/2016   CHOL 134 11/07/2015   CHOL 136 11/04/2013   Lab Results  Component Value Date   HDL 48 12/22/2016   HDL 64 11/07/2015   HDL 56 11/04/2013   Lab Results  Component Value Date   LDLCALC 47 12/22/2016   LDLCALC 40 11/07/2015   LDLCALC 14 11/04/2013   Lab Results  Component Value Date   TRIG 129 12/22/2016   TRIG 149 11/07/2015   TRIG 329 (H) 11/04/2013   Lab Results  Component Value Date   CHOLHDL 2.5 12/22/2016   CHOLHDL 2.1 11/07/2015   No results found for: LDLDIRECT    Radiology:  Dg Chest 2 View  Result Date: 12/08/2017 CLINICAL DATA:  Chest pain. Right upper quadrant pain. Nausea and vomiting. EXAM: CHEST - 2 VIEW COMPARISON:  07/03/2017 FINDINGS: Sequelae of prior CABG and right lower lobectomy are again identified with volume loss in the right hemithorax. The cardiac silhouette remains mildly enlarged. Aortic atherosclerosis is noted. No airspace consolidation, edema, or pneumothorax is identified. There is chronic blunting of the right costophrenic angle which may reflect a small pleural effusion or pleural thickening, unchanged. No acute osseous abnormality is identified. IMPRESSION: Small chronic right pleural effusion or pleural thickening without evidence of acute airspace disease. Electronically Signed   By: Logan Bores M.D.   On: 12/08/2017 11:19   Ct Abdomen Pelvis W Contrast  Result Date: 12/08/2017 CLINICAL DATA:  84 year old female with  nausea vomiting and right upper quadrant abdominal pain for 2 days. History of lung cancer. Dilated CBD and intrahepatic ducts on right upper quadrant ultrasound today. EXAM: CT ABDOMEN AND PELVIS WITH CONTRAST TECHNIQUE: Multidetector CT imaging of the abdomen and pelvis was performed using the standard protocol following bolus administration of intravenous contrast. CONTRAST:  135mL ISOVUE-300 IOPAMIDOL (ISOVUE-300) INJECTION 61% COMPARISON:  Right upper quadrant ultrasound 1150 hours today. CT Abdomen and Pelvis 12/21/2016. FINDINGS: Lower chest: Cardiomegaly and lung bases are stable since 2018, including a small layering right pleural effusion. Hepatobiliary: Intra- and extrahepatic biliary ductal dilatation. The CBD is up to 16 millimeters. In the distal CBD at the level of the pancreas there is a 12 millimeter rounded hyperdense filling defect with stippled fat density compatible with  choledocholithiasis. See coronal image 36 and sagittal image 65. This is just proximal to the ampullary. Surgically absent gallbladder. Pancreas: Negative; no pancreatic inflammation or main pancreatic ductal dilatation. Spleen: Stable and negative; scattered small calcified granulomas. Adrenals/Urinary Tract: Normal adrenal glands. Bilateral renal enhancement and contrast excretion is symmetric and within normal limits. Extrarenal pelvis, normal variant. Decompressed ureters. The bladder is distended but otherwise unremarkable. Estimated bladder volume is 566 milliliters. Stomach/Bowel: Mild gaseous distension of the rectum. Decompressed and negative sigmoid colon, descending colon, redundant transverse colon. Negative right colon. The cecum is on a lax mesentery located anteriorly. The appendix is diminutive or absent. Negative terminal ileum. No dilated small bowel. Small gastric hiatal hernia suspected. Negative intraabdominal stomach. No abnormality of the duodenum. No free air, free fluid. Vascular/Lymphatic: Severe  Aortoiliac calcified atherosclerosis. The major arterial structures in the abdomen and pelvis remain patent. Portal venous system is patent. No lymphadenopathy. Reproductive: Surgically absent. Other: No pelvic free fluid. Musculoskeletal: No acute osseous abnormality identified. IMPRESSION: 1. Obstructing 12 mm choledocholithiasis suspected in the distal CBD. Intra- and extrahepatic biliary ductal dilatation. 2. No other acute or inflammatory process identified in the abdomen or pelvis. 3. Distended urinary bladder with estimated volume of 566 mL. 4. Small chronic right pleural effusion. 5.  Aortic Atherosclerosis (ICD10-I70.0). Electronically Signed   By: Genevie Ann M.D.   On: 12/08/2017 12:36   US Abdomen Limited Ruq  Result Date: 12/08/2017 CLINICAL DATA:  84 year old female with elevated LFTs. EXAM: ULTRASOUND ABDOMEN LIMITED RIGHT UPPER QUADRANT COMPARISON:  CT Abdomen and Pelvis today reported separately. CT Abdomen and Pelvis 12/21/2016. FINDINGS: Gallbladder: Surgically absent. Common bile duct: Diameter: Up to 17 millimeters (dilated) on image 5. However, the CBD was 14 millimeters in 2018. No obstructing lesion identified within the visible portion of the duct. Liver: Intrahepatic ductal dilatation (image 24). Liver echogenicity within normal limits. No discrete liver lesion. Portal vein is patent on color Doppler imaging with normal direction of blood flow towards the liver. Other findings: A small right pleural effusion is noted on image 42. Negative visible right kidney. IMPRESSION: 1. Dilated CBD and intrahepatic biliary ductal dilatation, similar to but greater than that by CT in 2018 which appeared due to choledocholithiasis. 2. Small right pleural effusion. Electronically Signed   By: Genevie Ann M.D.   On: 12/08/2017 12:17     Telemetry    NSR - Personally Reviewed  ECG    Normal sinus rhythm with PACs, 1 mm of downsloping ST T wave abnormality in the lateral precordial leads left atrial  enlargement- Personally Reviewed   ASSESSMENT/PLAN:   1.  Elevated troponin -This is likely related to demand ischemia in the setting of acute cholangitis secondary to common bile duct stone. -Troponin initially 0.25 and then increased to 1.1> 1.17 -She does have some accentuation of downsloping ST segments in the lateral precordial leads which is more prominent than prior EKGs -She has not had any of her typical anginal symptoms that she has had in the past -I do not think this is related to an ACS syndrome as her troponin has been fairly flat at 1.1-1.17 except for the 0.25 initially.  Her symptoms are not like what she typically gets with her angina -Check 2D echocardiogram to assess LV function and if no wall motion normalities would not pursue further inpatient ischemic work-up  2.  ASCAD -Severe three-vessel CAD status post CABG 11/2016 -She has not had any anginal symptoms -ASA and Plavix currently on hold for  possible ERCP -would stop estrogen replacement give advanced age and CAD -continue BB  -statin on hold due to transaminitis  3.  HTN -BP borderline controlled on exam -continue Toprol XL 50mg  daily -consider restarting Losartan  4.  Hyperlipidemia -statin on hold due to elevated LFTs  5.  Hypokalemia -replete per TRH  6.  Acute cholangitis due to CBD stone -per GI and TRH  Fransico Him, MD  12/09/2017  9:23 AM

## 2017-12-09 NOTE — Progress Notes (Signed)
Brief GI Notation In anticipation of a possible ERCP tomorrow, we have placed a tentative hold with anesthesia for around 1130 for this patient's procedure. I reached out to cardiology service to understand timing and ability to stop heparin before procedure. We recognize she is on Plavix as well that was received within the last couple days.  Ideally we would have her off Plavix for 5 days but in the setting of cholangitis and bacteremia although she is stable at this point in time I am not sure that we will have the ability to wait for 5 days off Plavix.  Thus the intention would be an ERCP with stent placement hopefully not requiring any sort of sphincterotomy or fistulotomy to be created to get in however we do not always know about this. Cardiology is completing a echocardiogram and once that has been read they will alert the triad service or myself about the ability to come off heparin if necessary. Ideally she is off heparin for 6 hours and thus I would ask that if we are okay to proceed with her procedure tomorrow that we stop her heparin around 5 in the morning on 12/10/2017.  Still awaiting decision per cardiology which would come later this afternoon. In interim please reach out to [facility] until our [place] takes over at time of procedure date.  Justice Britain, MD [facility] Office # [phone]

## 2017-12-09 NOTE — Progress Notes (Signed)
PROGRESS NOTE                                                                                                                                                                                                             Patient Demographics:    [name], is a 84 y.o. female, DOB - 27-Dec-1930, NIO:[id number]  Admit date - 12/08/2017   Admitting Physician Rise Patience, MD  Outpatient Primary MD for the patient is Lajean Manes, MD  LOS - 1  Chief Complaint  Patient presents with  . Abnormal Lab    AST/ALT        Brief Narrative   [name] is a 84 y.o. female with history of CAD status post CABG, hypertension presents to the ER at [facility] with complaints of abdominal pain nausea vomiting.  Patient has been having these symptoms for last 2 days, CT scan suggested CBD stone and blockage.  She was admitted for further treatment.   Subjective:    Mariana Wiederholt today has, No headache, No chest pain, imoproved abdominal pain - No Nausea, No new weakness tingling or numbness, No Cough - SOB.   Assessment  & Plan :     1.  Acute cholangitis due to CBD stone and obstruction.  Currently on bowel rest, gentle IV fluids, IV cephalosporin, GI on board with plans for ERCP soon.  She is Plavix at baseline which is currently on hold.  Clinically mildly improved.  2.  Mild non-STEMI due to demand ischemia from stress from #1 above.  Currently chest pain-free, cardiology on board, getting echocardiogram to evaluate wall motion and EF, for now on beta-blocker stop heparin drip as told by cardiologist Dr. Radford Pax, antiplatelets and Plavix on hold due to #1 above.  Defer further management to cardiology.  3.  Hypertension.  Currently stable home dose beta-blocker and PRN hydralazine on board.  4.  Thrombocytopenia.  Placed on SCDs.  Too soon for HIT.  Most likely due to infection.  5.  Hypokalemia.  Replaced will monitor.   Family Communication  :  None  Code Status :   Full  Disposition Plan  :  TBD  Consults  :  Cards, GI  Procedures  :    TTE  CT - 1. Obstructing 12 mm choledocholithiasis suspected in the distal CBD. Intra- and extrahepatic biliary ductal dilatation. 2. No other acute or inflammatory process identified in the abdomen or pelvis. 3. Distended urinary bladder with estimated volume of 566  mL. 4. Small chronic right pleural effusion. 5.  Aortic Atherosclerosis.   DVT Prophylaxis  :  SCDs   Lab Results  Component Value Date   PLT 100 (L) 12/09/2017    Diet :  Diet Order            Diet NPO time specified  Diet effective midnight        Diet clear liquid Room service appropriate? Yes; Fluid consistency: Thin  Diet effective now               Inpatient Medications Scheduled Meds: . metoprolol succinate  50 mg Oral Daily   Continuous Infusions: . cefoTEtan (CEFOTAN) IV 1 g (12/09/17 0100)  . heparin 1,000 Units/hr (12/09/17 0423)   PRN Meds:.fentaNYL (SUBLIMAZE) injection, hydrALAZINE, morphine injection, nitroGLYCERIN, ondansetron **OR** ondansetron (ZOFRAN) IV  Antibiotics  :   Anti-infectives (From admission, onward)   Start     Dose/Rate Route Frequency Ordered Stop   12/08/17 2330  cefoTEtan (CEFOTAN) 1 g in sodium chloride 0.9 % 100 mL IVPB     1 g 200 mL/hr over 30 Minutes Intravenous Every 12 hours 12/08/17 2254            Objective:   Vitals:   12/09/17 0027 12/09/17 0100 12/09/17 0404 12/09/17 0832  BP:  137/61 (!) 141/60 (!) 156/74  Pulse:  81 76 78  Resp:  (!) 23 20 14   Temp: 98.3 F (36.8 C)  99.5 F (37.5 C)   TempSrc: Oral  Oral   SpO2:  95% 95% 95%  Weight:      Height:        Wt Readings from Last 3 Encounters:  12/08/17 80.8 kg  12/08/17 74.8 kg  08/03/17 78.9 kg     Intake/Output Summary (Last 24 hours) at 12/09/2017 1016 Last data filed at 12/09/2017 0423 Gross per 24 hour  Intake -  Output 450 ml  Net -450 ml     Physical Exam  Awake Alert, Oriented X 3, No new F.N  deficits, Normal affect [place].AT,PERRAL Supple Neck,No JVD, No cervical lymphadenopathy appriciated.  Symmetrical Chest wall movement, Good air movement bilaterally, CTAB RRR,No Gallops, loud aortic systolic murmur, No Parasternal Heave +ve B.Sounds, Abd Soft, +ve RUQ tenderness, No organomegaly appriciated, No rebound - guarding or rigidity. No Cyanosis, Clubbing or edema, No new Rash or bruise       Data Review:    CBC Recent Labs  Lab 12/08/17 1027 12/09/17 0557 12/09/17 0856  WBC 10.3 9.2 8.1  HGB 14.2 11.6* 11.6*  HCT 42.3 35.8* 35.5*  PLT 142* 107* 100*  MCV 89.8 91.6 90.6  MCH 30.1 29.7 29.6  MCHC 33.6 32.4 32.7  RDW 13.0 13.4 13.2  LYMPHSABS 0.9  --   --   MONOABS 0.3  --   --   EOSABS 0.0  --   --   BASOSABS 0.0  --   --     Chemistries  Recent Labs  Lab 12/08/17 1027 12/09/17 0350 12/09/17 0856  NA 138 138 140  K 3.3* 2.9* 3.0*  CL 98 105 105  CO2 26 26 28   GLUCOSE 136* 129* 116*  BUN 17 13 14   CREATININE 0.88 1.09* 1.04*  CALCIUM 9.4 8.1* 8.3*  AST 927* 333* 280*  ALT 707* 371* 343*  ALKPHOS 185* 121 124  BILITOT 8.4* 5.3* 5.7*   ------------------------------------------------------------------------------------------------------------------ No results for input(s): CHOL, HDL, LDLCALC, TRIG, CHOLHDL, LDLDIRECT in the last 72 hours.  Lab Results  Component Value Date   HGBA1C 5.6 12/23/2016   ------------------------------------------------------------------------------------------------------------------ No results for input(s): TSH, T4TOTAL, T3FREE, THYROIDAB in the last 72 hours.  Invalid input(s): FREET3 ------------------------------------------------------------------------------------------------------------------ No results for input(s): VITAMINB12, FOLATE, FERRITIN, TIBC, IRON, RETICCTPCT in the last 72 hours.  Coagulation profile Recent Labs  Lab 12/09/17 0350 12/09/17 0557  INR 1.27 1.48    No results for input(s): DDIMER in  the last 72 hours.  Cardiac Enzymes Recent Labs  Lab 12/08/17 1027 12/08/17 2323 12/09/17 0557  TROPONINI 0.25* 1.10* 1.17*   ------------------------------------------------------------------------------------------------------------------    Component Value Date/Time   BNP 280 11/03/2013 1222    Micro Results Recent Results (from the past 240 hour(s))  Blood culture (routine x 2)     Status: None (Preliminary result)   Collection Time: 12/08/17  1:12 PM  Result Value Ref Range Status   Specimen Description BLOOD LEFT HAND  Final   Special Requests   Final    BOTTLES DRAWN AEROBIC AND ANAEROBIC Blood Culture adequate volume   Culture   Final    NO GROWTH < 24 HOURS Performed at [facility], [address], [place], [place] [address]    Report Status PENDING  Incomplete  Blood culture (routine x 2)     Status: None (Preliminary result)   Collection Time: 12/08/17  1:12 PM  Result Value Ref Range Status   Specimen Description BLOOD RIGHT AC  Final   Special Requests   Final    BOTTLES DRAWN AEROBIC AND ANAEROBIC Blood Culture adequate volume   Culture  Setup Time   Final    Organism ID to follow GRAM NEGATIVE RODS IN BOTH AEROBIC AND ANAEROBIC BOTTLES CRITICAL RESULT CALLED TO, READ BACK BY AND VERIFIED WITH: CASSEY BAILEY AT 9417 12/09/17 [place] Performed at [facility], [address] [place], [place] [address]    Culture GRAM NEGATIVE RODS  Final   Report Status PENDING  Incomplete  Blood Culture ID Panel (Reflexed)     Status: Abnormal   Collection Time: 12/08/17  1:12 PM  Result Value Ref Range Status   Enterococcus species NOT DETECTED NOT DETECTED Final   Listeria monocytogenes NOT DETECTED NOT DETECTED Final   Staphylococcus species NOT DETECTED NOT DETECTED Final   Staphylococcus aureus (BCID) NOT DETECTED NOT DETECTED Final   Streptococcus species NOT DETECTED NOT DETECTED Final   Streptococcus agalactiae NOT DETECTED NOT DETECTED  Final   Streptococcus pneumoniae NOT DETECTED NOT DETECTED Final   Streptococcus pyogenes NOT DETECTED NOT DETECTED Final   Acinetobacter baumannii NOT DETECTED NOT DETECTED Final   Enterobacteriaceae species DETECTED (A) NOT DETECTED Final    Comment: Enterobacteriaceae represent a large family of gram-negative bacteria, not a single organism. CRITICAL RESULT CALLED TO, READ BACK BY AND VERIFIED WITH:  CASSEY BAILEY AT 4818 12/09/17 SDR    Enterobacter cloacae complex NOT DETECTED NOT DETECTED Final   Escherichia coli NOT DETECTED NOT DETECTED Final   Klebsiella oxytoca DETECTED (A) NOT DETECTED Final    Comment: CRITICAL RESULT CALLED TO, READ BACK BY AND VERIFIED WITH:  [place] AT 0514 12/09/17 SDR    Klebsiella pneumoniae NOT DETECTED NOT DETECTED Final   Proteus species NOT DETECTED NOT DETECTED Final   Serratia marcescens NOT DETECTED NOT DETECTED Final   Carbapenem resistance NOT DETECTED NOT DETECTED Final   Haemophilus influenzae NOT DETECTED NOT DETECTED Final   Neisseria meningitidis NOT DETECTED NOT DETECTED Final   Pseudomonas aeruginosa NOT DETECTED NOT DETECTED Final  Candida albicans NOT DETECTED NOT DETECTED Final   Candida glabrata NOT DETECTED NOT DETECTED Final   Candida krusei NOT DETECTED NOT DETECTED Final   Candida parapsilosis NOT DETECTED NOT DETECTED Final   Candida tropicalis NOT DETECTED NOT DETECTED Final    Comment: Performed at [facility], [address] [place], [place] [address]    Radiology Reports Dg Chest 2 View  Result Date: 12/08/2017 CLINICAL DATA:  Chest pain. Right upper quadrant pain. Nausea and vomiting. EXAM: CHEST - 2 VIEW COMPARISON:  07/03/2017 FINDINGS: Sequelae of prior CABG and right lower lobectomy are again identified with volume loss in the right hemithorax. The cardiac silhouette remains mildly enlarged. Aortic atherosclerosis is noted. No airspace consolidation, edema, or pneumothorax is identified. There is  chronic blunting of the right costophrenic angle which may reflect a small pleural effusion or pleural thickening, unchanged. No acute osseous abnormality is identified. IMPRESSION: Small chronic right pleural effusion or pleural thickening without evidence of acute airspace disease. Electronically Signed   By: Logan Bores M.D.   On: 12/08/2017 11:19   Ct Abdomen Pelvis W Contrast  Result Date: 12/08/2017 CLINICAL DATA:  84 year old female with nausea vomiting and right upper quadrant abdominal pain for 2 days. History of lung cancer. Dilated CBD and intrahepatic ducts on right upper quadrant ultrasound today. EXAM: CT ABDOMEN AND PELVIS WITH CONTRAST TECHNIQUE: Multidetector CT imaging of the abdomen and pelvis was performed using the standard protocol following bolus administration of intravenous contrast. CONTRAST:  140mL ISOVUE-300 IOPAMIDOL (ISOVUE-300) INJECTION 61% COMPARISON:  Right upper quadrant ultrasound 1150 hours today. CT Abdomen and Pelvis 12/21/2016. FINDINGS: Lower chest: Cardiomegaly and lung bases are stable since 2018, including a small layering right pleural effusion. Hepatobiliary: Intra- and extrahepatic biliary ductal dilatation. The CBD is up to 16 millimeters. In the distal CBD at the level of the pancreas there is a 12 millimeter rounded hyperdense filling defect with stippled fat density compatible with choledocholithiasis. See coronal image 36 and sagittal image 65. This is just proximal to the ampullary. Surgically absent gallbladder. Pancreas: Negative; no pancreatic inflammation or main pancreatic ductal dilatation. Spleen: Stable and negative; scattered small calcified granulomas. Adrenals/Urinary Tract: Normal adrenal glands. Bilateral renal enhancement and contrast excretion is symmetric and within normal limits. Extrarenal pelvis, normal variant. Decompressed ureters. The bladder is distended but otherwise unremarkable. Estimated bladder volume is 566 milliliters.  Stomach/Bowel: Mild gaseous distension of the rectum. Decompressed and negative sigmoid colon, descending colon, redundant transverse colon. Negative right colon. The cecum is on a lax mesentery located anteriorly. The appendix is diminutive or absent. Negative terminal ileum. No dilated small bowel. Small gastric hiatal hernia suspected. Negative intraabdominal stomach. No abnormality of the duodenum. No free air, free fluid. Vascular/Lymphatic: Severe Aortoiliac calcified atherosclerosis. The major arterial structures in the abdomen and pelvis remain patent. Portal venous system is patent. No lymphadenopathy. Reproductive: Surgically absent. Other: No pelvic free fluid. Musculoskeletal: No acute osseous abnormality identified. IMPRESSION: 1. Obstructing 12 mm choledocholithiasis suspected in the distal CBD. Intra- and extrahepatic biliary ductal dilatation. 2. No other acute or inflammatory process identified in the abdomen or pelvis. 3. Distended urinary bladder with estimated volume of 566 mL. 4. Small chronic right pleural effusion. 5.  Aortic Atherosclerosis (ICD10-I70.0). Electronically Signed   By: Genevie Ann M.D.   On: 12/08/2017 12:36   Ct Hip Right Wo Contrast  Result Date: 11/11/2017 CLINICAL DATA:  Right hip and thigh pain for the past few weeks. Fall a few months ago.  Evaluate for fracture. EXAM: CT OF THE RIGHT HIP WITHOUT CONTRAST TECHNIQUE: Multidetector CT imaging of the right hip was performed according to the standard protocol. Multiplanar CT image reconstructions were also generated. COMPARISON:  CT abdomen pelvis dated December 21, 2016. FINDINGS: Bones/Joint/Cartilage No fracture or dislocation. No periosteal reaction. Normal alignment. No joint effusion. Mild right hip joint space narrowing with small marginal osteophytes. Ligaments Ligaments are suboptimally evaluated by CT. Muscles and Tendons Gluteus minimus muscle atrophy. The gluteal, hamstring, and iliopsoas tendons are grossly  intact. Soft tissue No fluid collection or hematoma. No soft tissue mass. Aortoiliac atherosclerotic vascular disease. Prior hysterectomy. IMPRESSION: 1.  No acute osseous abnormality. 2. Mild right hip osteoarthritis. Electronically Signed   By: Titus Dubin M.D.   On: 11/11/2017 15:23   US Abdomen Limited Ruq  Result Date: 12/08/2017 CLINICAL DATA:  84 year old female with elevated LFTs. EXAM: ULTRASOUND ABDOMEN LIMITED RIGHT UPPER QUADRANT COMPARISON:  CT Abdomen and Pelvis today reported separately. CT Abdomen and Pelvis 12/21/2016. FINDINGS: Gallbladder: Surgically absent. Common bile duct: Diameter: Up to 17 millimeters (dilated) on image 5. However, the CBD was 14 millimeters in 2018. No obstructing lesion identified within the visible portion of the duct. Liver: Intrahepatic ductal dilatation (image 24). Liver echogenicity within normal limits. No discrete liver lesion. Portal vein is patent on color Doppler imaging with normal direction of blood flow towards the liver. Other findings: A small right pleural effusion is noted on image 42. Negative visible right kidney. IMPRESSION: 1. Dilated CBD and intrahepatic biliary ductal dilatation, similar to but greater than that by CT in 2018 which appeared due to choledocholithiasis. 2. Small right pleural effusion. Electronically Signed   By: Genevie Ann M.D.   On: 12/08/2017 12:17    Time Spent in minutes  30   Lala Lund M.D on 12/09/2017 at 10:16 AM  To page go to [email or web address] - password [email or web address]

## 2017-12-09 NOTE — H&P (View-Only) (Signed)
Patient is feeling much better overnight.  She now rates her pain at a level of 1 on a scale of 10, as compared to 5 yesterday evening.  She slept well through the night.  Apparently there has been notification of a positive blood culture from blood obtained at [facility].  Identification is pending.  Cardiology consultation reviewed and much appreciated.  Echocardiogram is pending.  Exam: Patient lying in bed in no distress.  Afebrile.  Mild jaundice.  Active bowel sounds are present, and abdominal exam is somewhat "jumpy" with some voluntary guarding but, with distraction, no exquisite tenderness is present.  No peritoneal findings.  Chest and heart are unremarkable to my exam.  Patient is alert, coherent, and appropriate.  Labs: Transaminases are moderately improved overnight, but the total bilirubin has crept up slightly from 5.3 to a current level of 5.7.  White count normal at 8100.  Impression:   1.  Incipient cholangitis secondary to biliary obstruction from a common bile duct stone, responding nicely to Cefotan therapy.  2.  History of coronary disease status post bypass surgery about a year ago.  Recent chest pain, felt probably to be noncardiac, and elevated troponins most likely related to demand ischemia.  Recommendations (discussed with Dr. Rush Landmark, ERCP physician with [facility]):   1.  At present, it does not appear that the patient needs emergent ERCP and drainage 2.  Await echo results, tentative plan for ERCP with biliary stent placement tomorrow to achieve drainage (could do sooner if patient became unstable, but fortunately, she seems to be heading in the right direction rather than the wrong direction) 3.  Continue antibiotic therapy, particularly in view of positive blood culture 4. Will allow clear liquid diet today, have made n.p.o. after midnight in anticipation of possible ERCP tomorrow 5.  Timing of definitive ERCP with sphincterotomy and stone extraction  to be determined; presumably, it will occur about 5 days from now.  Unclear if the patient will need to stay in the hospital during that time, or whether she could go home and come back. 6.  Since this patient's problem requires multiple decisions regarding arrangement and timing of her ERCP, Dr. Rush Landmark has kindly consented to assume her GI care starting tomorrow.  Please feel free to call me in the meantime with any issues or questions.  Cleotis Nipper, M.D. Pager [phone] If no answer or after 5 PM call [phone]

## 2017-12-09 NOTE — Progress Notes (Addendum)
Pt have a positive blood culture from [facility], page on call about result, call back not received yet. Removed pt Foley due to c/o of liking and pain. After removal bladder scan show 17cc. Pt stated that she have no problem urinated.

## 2017-12-09 NOTE — Progress Notes (Signed)
Patient is feeling much better overnight.  She now rates her pain at a level of 1 on a scale of 10, as compared to 5 yesterday evening.  She slept well through the night.  Apparently there has been notification of a positive blood culture from blood obtained at [facility].  Identification is pending.  Cardiology consultation reviewed and much appreciated.  Echocardiogram is pending.  Exam: Patient lying in bed in no distress.  Afebrile.  Mild jaundice.  Active bowel sounds are present, and abdominal exam is somewhat "jumpy" with some voluntary guarding but, with distraction, no exquisite tenderness is present.  No peritoneal findings.  Chest and heart are unremarkable to my exam.  Patient is alert, coherent, and appropriate.  Labs: Transaminases are moderately improved overnight, but the total bilirubin has crept up slightly from 5.3 to a current level of 5.7.  White count normal at 8100.  Impression:   1.  Incipient cholangitis secondary to biliary obstruction from a common bile duct stone, responding nicely to Cefotan therapy.  2.  History of coronary disease status post bypass surgery about a year ago.  Recent chest pain, felt probably to be noncardiac, and elevated troponins most likely related to demand ischemia.  Recommendations (discussed with Dr. Rush Landmark, ERCP physician with [facility]):   1.  At present, it does not appear that the patient needs emergent ERCP and drainage 2.  Await echo results, tentative plan for ERCP with biliary stent placement tomorrow to achieve drainage (could do sooner if patient became unstable, but fortunately, she seems to be heading in the right direction rather than the wrong direction) 3.  Continue antibiotic therapy, particularly in view of positive blood culture 4. Will allow clear liquid diet today, have made n.p.o. after midnight in anticipation of possible ERCP tomorrow 5.  Timing of definitive ERCP with sphincterotomy and stone extraction  to be determined; presumably, it will occur about 5 days from now.  Unclear if the patient will need to stay in the hospital during that time, or whether she could go home and come back. 6.  Since this patient's problem requires multiple decisions regarding arrangement and timing of her ERCP, Dr. Rush Landmark has kindly consented to assume her GI care starting tomorrow.  Please feel free to call me in the meantime with any issues or questions.  Cleotis Nipper, M.D. Pager [phone] If no answer or after 5 PM call [phone]

## 2017-12-09 NOTE — Progress Notes (Addendum)
CRITICAL VALUE ALERT  Critical Value:  Troponin 1.10  Date & Time Notied:  12/09/17 0111  Provider Notified: Dr. Glenis Smoker  Orders Received/Actions taken: Order for EKG.

## 2017-12-10 ENCOUNTER — Inpatient Hospital Stay (HOSPITAL_COMMUNITY): Payer: Medicare Other | Admitting: Registered Nurse

## 2017-12-10 ENCOUNTER — Inpatient Hospital Stay (HOSPITAL_COMMUNITY): Payer: Medicare Other

## 2017-12-10 ENCOUNTER — Encounter (HOSPITAL_COMMUNITY)
Admission: EM | Disposition: A | Discharge status: Home Health | Payer: Self-pay | Source: Home / Self Care | Attending: Internal Medicine

## 2017-12-10 ENCOUNTER — Other Ambulatory Visit (HOSPITAL_COMMUNITY): Payer: Self-pay

## 2017-12-10 ENCOUNTER — Encounter (HOSPITAL_COMMUNITY): Payer: Self-pay | Admitting: *Deleted

## 2017-12-10 DIAGNOSIS — I340 Nonrheumatic mitral (valve) insufficiency: Secondary | ICD-10-CM

## 2017-12-10 DIAGNOSIS — I361 Nonrheumatic tricuspid (valve) insufficiency: Secondary | ICD-10-CM

## 2017-12-10 DIAGNOSIS — K8030 Calculus of bile duct with cholangitis, unspecified, without obstruction: Secondary | ICD-10-CM

## 2017-12-10 DIAGNOSIS — K838 Other specified diseases of biliary tract: Secondary | ICD-10-CM

## 2017-12-10 DIAGNOSIS — R748 Abnormal levels of other serum enzymes: Secondary | ICD-10-CM

## 2017-12-10 HISTORY — PX: BILIARY STENT PLACEMENT: SHX5538

## 2017-12-10 HISTORY — PX: ERCP: SHX5425

## 2017-12-10 LAB — PROTIME-INR
INR: 1.09
Prothrombin Time: 14.1 seconds (ref 11.4–15.2)

## 2017-12-10 LAB — CBC
HCT: 36.9 % (ref 36.0–46.0)
Hemoglobin: 11.9 g/dL — ABNORMAL LOW (ref 12.0–15.0)
MCH: 29.5 pg (ref 26.0–34.0)
MCHC: 32.2 g/dL (ref 30.0–36.0)
MCV: 91.6 fL (ref 80.0–100.0)
PLATELETS: 103 10*3/uL — AB (ref 150–400)
RBC: 4.03 MIL/uL (ref 3.87–5.11)
RDW: 13.2 % (ref 11.5–15.5)
WBC: 7.3 10*3/uL (ref 4.0–10.5)
nRBC: 0 % (ref 0.0–0.2)

## 2017-12-10 LAB — ECHOCARDIOGRAM COMPLETE
Height: 66 in
Weight: 2850.11 oz

## 2017-12-10 LAB — MAGNESIUM: Magnesium: 2 mg/dL (ref 1.7–2.4)

## 2017-12-10 LAB — COMPREHENSIVE METABOLIC PANEL
ALT: 252 U/L — ABNORMAL HIGH (ref 0–44)
AST: 154 U/L — ABNORMAL HIGH (ref 15–41)
Albumin: 2.6 g/dL — ABNORMAL LOW (ref 3.5–5.0)
Alkaline Phosphatase: 156 U/L — ABNORMAL HIGH (ref 38–126)
Anion gap: 8 (ref 5–15)
BUN: 15 mg/dL (ref 8–23)
CO2: 25 mmol/L (ref 22–32)
Calcium: 8.5 mg/dL — ABNORMAL LOW (ref 8.9–10.3)
Chloride: 105 mmol/L (ref 98–111)
Creatinine, Ser: 1.01 mg/dL — ABNORMAL HIGH (ref 0.44–1.00)
GFR calc Af Amer: 59 mL/min — ABNORMAL LOW (ref >60)
GFR calc non Af Amer: 51 mL/min — ABNORMAL LOW (ref >60)
Glucose, Bld: 152 mg/dL — ABNORMAL HIGH (ref 70–99)
POTASSIUM: 3.8 mmol/L (ref 3.5–5.1)
Sodium: 138 mmol/L (ref 135–145)
Total Bilirubin: 6.2 mg/dL — ABNORMAL HIGH (ref 0.3–1.2)
Total Protein: 5.9 g/dL — ABNORMAL LOW (ref 6.5–8.1)

## 2017-12-10 LAB — TROPONIN I: Troponin I: 0.49 ng/mL (ref <0.03)

## 2017-12-10 SURGERY — ERCP, WITH INTERVENTION IF INDICATED
Anesthesia: General

## 2017-12-10 MED ORDER — SUGAMMADEX SODIUM 200 MG/2ML IV SOLN
INTRAVENOUS | Status: DC | PRN
  Administered 2017-12-10: 200 mg via INTRAVENOUS

## 2017-12-10 MED ORDER — GLUCAGON HCL RDNA (DIAGNOSTIC) 1 MG IJ SOLR
INTRAMUSCULAR | Status: AC
  Filled 2017-12-10: qty 1

## 2017-12-10 MED ORDER — PROPOFOL 10 MG/ML IV BOLUS
INTRAVENOUS | Status: DC | PRN
  Administered 2017-12-10: 90 mg via INTRAVENOUS

## 2017-12-10 MED ORDER — INDOMETHACIN 50 MG RE SUPP
RECTAL | Status: DC | PRN
  Administered 2017-12-10: 100 mg via RECTAL

## 2017-12-10 MED ORDER — LACTATED RINGERS IV SOLN
INTRAVENOUS | Status: DC | PRN
  Administered 2017-12-10: 11:00:00 via INTRAVENOUS

## 2017-12-10 MED ORDER — ROCURONIUM BROMIDE 10 MG/ML (PF) SYRINGE
PREFILLED_SYRINGE | INTRAVENOUS | Status: DC | PRN
  Administered 2017-12-10: 50 mg via INTRAVENOUS

## 2017-12-10 MED ORDER — IOPAMIDOL (ISOVUE-300) INJECTION 61%
INTRAVENOUS | Status: AC
  Filled 2017-12-10: qty 50

## 2017-12-10 MED ORDER — SODIUM CHLORIDE 0.9 % IV SOLN
INTRAVENOUS | Status: DC | PRN
  Administered 2017-12-10: 1 g via INTRAVENOUS

## 2017-12-10 MED ORDER — ONDANSETRON HCL 4 MG/2ML IJ SOLN
INTRAMUSCULAR | Status: DC | PRN
  Administered 2017-12-10: 4 mg via INTRAVENOUS

## 2017-12-10 MED ORDER — PHENYLEPHRINE 40 MCG/ML (10ML) SYRINGE FOR IV PUSH (FOR BLOOD PRESSURE SUPPORT)
PREFILLED_SYRINGE | INTRAVENOUS | Status: DC | PRN
  Administered 2017-12-10: 80 ug via INTRAVENOUS

## 2017-12-10 MED ORDER — SODIUM CHLORIDE 0.9 % IV SOLN
INTRAVENOUS | Status: DC | PRN
  Administered 2017-12-10: 10 mL

## 2017-12-10 MED ORDER — INDOMETHACIN 50 MG RE SUPP
100.0000 mg | Freq: Once | RECTAL | Status: AC
  Administered 2017-12-10: 100 mg via RECTAL
  Filled 2017-12-10: qty 2

## 2017-12-10 MED ORDER — SODIUM CHLORIDE 0.9 % IV SOLN
2.0000 g | INTRAVENOUS | Status: DC
  Administered 2017-12-10 – 2017-12-11 (×2): 2 g via INTRAVENOUS
  Filled 2017-12-10 (×3): qty 20

## 2017-12-10 MED ORDER — FENTANYL CITRATE (PF) 250 MCG/5ML IJ SOLN
INTRAMUSCULAR | Status: DC | PRN
  Administered 2017-12-10: 50 ug via INTRAVENOUS

## 2017-12-10 MED ORDER — PROPOFOL 500 MG/50ML IV EMUL
INTRAVENOUS | Status: DC | PRN
  Administered 2017-12-10: 20 ug/kg/min via INTRAVENOUS

## 2017-12-10 MED ORDER — LIDOCAINE 2% (20 MG/ML) 5 ML SYRINGE
INTRAMUSCULAR | Status: DC | PRN
  Administered 2017-12-10: 60 mg via INTRAVENOUS

## 2017-12-10 MED ORDER — INDOMETHACIN 50 MG RE SUPP
RECTAL | Status: AC
  Filled 2017-12-10: qty 2

## 2017-12-10 MED ORDER — GLUCAGON HCL RDNA (DIAGNOSTIC) 1 MG IJ SOLR
INTRAMUSCULAR | Status: DC | PRN
  Administered 2017-12-10: 0.25 mg via INTRAVENOUS

## 2017-12-10 NOTE — Progress Notes (Signed)
Progress Note  Patient Name: [name] Date of Encounter: 12/10/2017  Primary Cardiologist:   Ida Rogue, MD   Subjective   She has continued to be nauseated without bleeding.  She denies chest pain.   No precordial pain.  Inpatient Medications    Scheduled Meds: . metoprolol succinate  50 mg Oral Daily   Continuous Infusions: . sodium chloride 20 mL/hr at 12/09/17 2323  . cefoTEtan (CEFOTAN) IV Stopped (12/09/17 2155)  . lactated ringers 75 mL/hr at 12/10/17 0137   PRN Meds: fentaNYL (SUBLIMAZE) injection, hydrALAZINE, LORazepam, morphine injection, nitroGLYCERIN, ondansetron **OR** ondansetron (ZOFRAN) IV   Vital Signs    Vitals:   12/09/17 2330 12/10/17 0000 12/10/17 0421 12/10/17 0718  BP: (!) 166/78  (!) 173/76 (!) 164/86  Pulse: 78  73 85  Resp: 19  12 17   Temp:  98.6 F (37 C) 98.1 F (36.7 C)   TempSrc:  Oral Oral   SpO2: 91%  93% 95%  Weight:      Height:        Intake/Output Summary (Last 24 hours) at 12/10/2017 0855 Last data filed at 12/09/2017 2323 Gross per 24 hour  Intake 1009.52 ml  Output 520 ml  Net 489.52 ml   Filed Weights   12/08/17 2056  Weight: 80.8 kg    Telemetry    NSR - Personally Reviewed  ECG    NA - Personally Reviewed  Physical Exam   GEN: No acute distress.   Neck: No  JVD Cardiac: RRR, 3/6 apical systolic murmur, no diastolic murmurs, rubs, or gallops.  Respiratory: Clear  to auscultation bilaterally. GI: Soft, nontender, non-distended  MS: No  edema; No deformity. Neuro:  Nonfocal  Psych: Normal affect  Labs    Chemistry Recent Labs  Lab 12/09/17 0350 12/09/17 0856 12/10/17 0407  NA 138 140 138  K 2.9* 3.0* 3.8  CL 105 105 105  CO2 26 28 25   GLUCOSE 129* 116* 152*  BUN 13 14 15   CREATININE 1.09* 1.04* 1.01*  CALCIUM 8.1* 8.3* 8.5*  PROT 5.3* 5.8* 5.9*  ALBUMIN 2.7* 2.8* 2.6*  AST 333* 280* 154*  ALT 371* 343* 252*  ALKPHOS 121 124 156*  BILITOT 5.3* 5.7* 6.2*  GFRNONAA 46* 49*  51*  GFRAA 54* 57* 59*  ANIONGAP 7 7 8      Hematology Recent Labs  Lab 12/09/17 0557 12/09/17 0856 12/10/17 0407  WBC 9.2 8.1 7.3  RBC 3.91 3.92 4.03  HGB 11.6* 11.6* 11.9*  HCT 35.8* 35.5* 36.9  MCV 91.6 90.6 91.6  MCH 29.7 29.6 29.5  MCHC 32.4 32.7 32.2  RDW 13.4 13.2 13.2  PLT 107* 100* 103*    Cardiac Enzymes Recent Labs  Lab 12/08/17 1027 12/08/17 2323 12/09/17 0557 12/10/17 0407  TROPONINI 0.25* 1.10* 1.17* 0.49*   No results for input(s): TROPIPOC in the last 168 hours.   BNPNo results for input(s): BNP, PROBNP in the last 168 hours.   DDimer No results for input(s): DDIMER in the last 168 hours.   Radiology    Dg Chest 2 View  Result Date: 12/08/2017 CLINICAL DATA:  Chest pain. Right upper quadrant pain. Nausea and vomiting. EXAM: CHEST - 2 VIEW COMPARISON:  07/03/2017 FINDINGS: Sequelae of prior CABG and right lower lobectomy are again identified with volume loss in the right hemithorax. The cardiac silhouette remains mildly enlarged. Aortic atherosclerosis is noted. No airspace consolidation, edema, or pneumothorax is identified. There is chronic blunting of the right costophrenic  angle which may reflect a small pleural effusion or pleural thickening, unchanged. No acute osseous abnormality is identified. IMPRESSION: Small chronic right pleural effusion or pleural thickening without evidence of acute airspace disease. Electronically Signed   By: Logan Bores M.D.   On: 12/08/2017 11:19   Ct Abdomen Pelvis W Contrast  Result Date: 12/08/2017 CLINICAL DATA:  84 year old female with nausea vomiting and right upper quadrant abdominal pain for 2 days. History of lung cancer. Dilated CBD and intrahepatic ducts on right upper quadrant ultrasound today. EXAM: CT ABDOMEN AND PELVIS WITH CONTRAST TECHNIQUE: Multidetector CT imaging of the abdomen and pelvis was performed using the standard protocol following bolus administration of intravenous contrast. CONTRAST:  170mL  ISOVUE-300 IOPAMIDOL (ISOVUE-300) INJECTION 61% COMPARISON:  Right upper quadrant ultrasound 1150 hours today. CT Abdomen and Pelvis 12/21/2016. FINDINGS: Lower chest: Cardiomegaly and lung bases are stable since 2018, including a small layering right pleural effusion. Hepatobiliary: Intra- and extrahepatic biliary ductal dilatation. The CBD is up to 16 millimeters. In the distal CBD at the level of the pancreas there is a 12 millimeter rounded hyperdense filling defect with stippled fat density compatible with choledocholithiasis. See coronal image 36 and sagittal image 65. This is just proximal to the ampullary. Surgically absent gallbladder. Pancreas: Negative; no pancreatic inflammation or main pancreatic ductal dilatation. Spleen: Stable and negative; scattered small calcified granulomas. Adrenals/Urinary Tract: Normal adrenal glands. Bilateral renal enhancement and contrast excretion is symmetric and within normal limits. Extrarenal pelvis, normal variant. Decompressed ureters. The bladder is distended but otherwise unremarkable. Estimated bladder volume is 566 milliliters. Stomach/Bowel: Mild gaseous distension of the rectum. Decompressed and negative sigmoid colon, descending colon, redundant transverse colon. Negative right colon. The cecum is on a lax mesentery located anteriorly. The appendix is diminutive or absent. Negative terminal ileum. No dilated small bowel. Small gastric hiatal hernia suspected. Negative intraabdominal stomach. No abnormality of the duodenum. No free air, free fluid. Vascular/Lymphatic: Severe Aortoiliac calcified atherosclerosis. The major arterial structures in the abdomen and pelvis remain patent. Portal venous system is patent. No lymphadenopathy. Reproductive: Surgically absent. Other: No pelvic free fluid. Musculoskeletal: No acute osseous abnormality identified. IMPRESSION: 1. Obstructing 12 mm choledocholithiasis suspected in the distal CBD. Intra- and extrahepatic biliary  ductal dilatation. 2. No other acute or inflammatory process identified in the abdomen or pelvis. 3. Distended urinary bladder with estimated volume of 566 mL. 4. Small chronic right pleural effusion. 5.  Aortic Atherosclerosis (ICD10-I70.0). Electronically Signed   By: Genevie Ann M.D.   On: 12/08/2017 12:36   US Abdomen Limited Ruq  Result Date: 12/08/2017 CLINICAL DATA:  84 year old female with elevated LFTs. EXAM: ULTRASOUND ABDOMEN LIMITED RIGHT UPPER QUADRANT COMPARISON:  CT Abdomen and Pelvis today reported separately. CT Abdomen and Pelvis 12/21/2016. FINDINGS: Gallbladder: Surgically absent. Common bile duct: Diameter: Up to 17 millimeters (dilated) on image 5. However, the CBD was 14 millimeters in 2018. No obstructing lesion identified within the visible portion of the duct. Liver: Intrahepatic ductal dilatation (image 24). Liver echogenicity within normal limits. No discrete liver lesion. Portal vein is patent on color Doppler imaging with normal direction of blood flow towards the liver. Other findings: A small right pleural effusion is noted on image 42. Negative visible right kidney. IMPRESSION: 1. Dilated CBD and intrahepatic biliary ductal dilatation, similar to but greater than that by CT in 2018 which appeared due to choledocholithiasis. 2. Small right pleural effusion. Electronically Signed   By: Genevie Ann M.D.   On: 12/08/2017 12:17  Cardiac Studies   ECHO: - Left ventricle: The cavity size was normal. Wall thickness was   normal. Systolic function was normal. The estimated ejection   fraction was in the range of 55% to 60%. Wall motion was normal;   there were no regional wall motion abnormalities. Doppler   parameters are consistent with abnormal left ventricular   relaxation (grade 1 diastolic dysfunction). The E/e&' ratio is   >15, suggesting elevated LV filling pressure. - Mitral valve: Mildly thickened leaflets . There was moderate   regurgitation. - Left atrium: The atrium  was normal in size. - Tricuspid valve: There was mild regurgitation. - Pulmonary arteries: PA peak pressure: 52 mm Hg (S). - Inferior vena cava: The vessel was dilated. The respirophasic   diameter changes were blunted (< 50%), consistent with elevated   central venous pressure.  Patient Profile     84 y.o. female with a history of a SCID status post PCI to LAD and left circumflex in 2001 with bare-metal stents.  Repeat cardiac cath in April 2014 showed an occluded RCA with a patent LAD and left circumflex stents.  She had recurrent chest pain in 11/2016 and underwent three-vessel CABG (LIMA>LAD, SVG>PDA, SVG>OM) after cath showed critical left main disease with severe three-vessel CAD.  She had postoperative atrial fibrillation treated with amiodarone.  She also has a history of <50% left ICA stenosis.  She has a history of hypertension and PVCs as well as hyperlipidemia.  Assessment & Plan   PREOP:  The patient has no high risk symptoms.  Echo prelim reviewed and she continues to have NL LVEF with no regional wall motion abnormalities.  She continues to have elevated trop but they have trended down and she has not had any acute EKG changes.  She is not having anginal chest pain and her planned GI surgery is not high risk and is indicated based on her clinical scenario.  Therefore, based on all of the above and , according to ACC/AHA guidelines.  The patient is at acceptable risk for the planned procedure.    I reviewed the echo images personally.    ELEVATED TROPONIN:  Likely demand ischemia.  Follow exam, symptoms after surgery.  No indication for further cardiac testing at this point.   HTN:  BP is elevated.  However, we will follow this post op.  Restart Norvasc when taking POs post op.  CAD:  Plavix on hold.  Would resume ASA if OK with GI.   MR:  This is moderate and will be followed clinically as an out patient.   For questions or updates, please contact [place] Please consult  [email or web address] for contact info under Cardiology/STEMI.   Signed, Minus Breeding, MD  12/10/2017, 8:55 AM

## 2017-12-10 NOTE — Consult Note (Signed)
            [facility] CM Primary Care Navigator  12/10/2017  [name] 26-Jan-1930 [id number]   Went to see patient at the bedside to identify possible discharge needs butstaff reports that she is off the unit in Endo for a procedure at this time(EGD- Esophagogastroduodenoscopy).   Will attempt tofollow-up andsee patientforfurther [facility]-CM needs when available in the room.    Addendum (12/14/17):  Went back to seepatient at the bedside to identify possible discharge needs butshewas already discharged home(declined home health) over the weekend.  Per MD note, patient presented with complaints of abdominal pain, nausea and vomiting with CT scan suggesting CBD- common bile duct stone and blockage. (acute cholangitis due to common bile duct stone and obstruction, mild non-ST elevated myocardial infarction due to demand ischemia; hypokalemia, choledocholithiasis)  Patient has discharge instruction to follow-up with primary care provider in 1- 2 weeks, cardiology and gastroenterology follow-up in 1 week.  Primary care provider's office is listed as providing transition of care (TOC) follow-up.    For additional questions please contact:  Edwena Felty A. Girlie Veltri, BSN, RN-BC [facility] PRIMARY CARE Navigator Cell: [phone]

## 2017-12-10 NOTE — Anesthesia Preprocedure Evaluation (Addendum)
Anesthesia Evaluation  Patient identified by MRN, date of birth, ID band Patient awake    Reviewed: Allergy & Precautions, NPO status , Patient's Chart, lab work & pertinent test results  History of Anesthesia Complications Negative for: history of anesthetic complications  Airway Mallampati: II  TM Distance: <3 FB Neck ROM: Full    Dental  (+) Dental Advisory Given, Teeth Intact   Pulmonary   Carcinoid lung tumor    breath sounds clear to auscultation       Cardiovascular hypertension, Pt. on medications and Pt. on home beta blockers + CAD, + Past MI, + Cardiac Stents and + CABG (2018)   Rhythm:Regular Rate:Normal + Systolic murmurs  '19 TTE - EF 55% to 60%. Grade 1 diastolic dysfunction. Moderate MR. Mild TR. PA peak pressure: 52 mm Hg.  Demand ischemia with elevated troponins, seen and cleared by cardiology for procedure     Neuro/Psych PSYCHIATRIC DISORDERS Anxiety Depression TIA   GI/Hepatic negative GI ROS,  Elevated LFTs Cholangitis Gilbert's syndrome    Endo/Other  negative endocrine ROS  Renal/GU negative Renal ROS     Musculoskeletal  (+) Arthritis ,   Abdominal   Peds  Hematology  Thrombocytopenia    Anesthesia Other Findings   Reproductive/Obstetrics                           Anesthesia Physical Anesthesia Plan  ASA: III  Anesthesia Plan: General   Post-op Pain Management:    Induction: Intravenous  PONV Risk Score and Plan: 3 and Treatment may vary due to age or medical condition, Ondansetron and Propofol infusion  Airway Management Planned: Oral ETT  Additional Equipment: None  Intra-op Plan:   Post-operative Plan: Extubation in OR  Informed Consent: I have reviewed the patients History and Physical, chart, labs and discussed the procedure including the risks, benefits and alternatives for the proposed anesthesia with the patient or authorized  representative who has indicated his/her understanding and acceptance.   Dental advisory given  Plan Discussed with: CRNA and Anesthesiologist  Anesthesia Plan Comments:        Anesthesia Quick Evaluation

## 2017-12-10 NOTE — Anesthesia Procedure Notes (Signed)
Procedure Name: Intubation Date/Time: 12/10/2017 10:53 AM Performed by: Jearld Pies, CRNA Pre-anesthesia Checklist: Patient identified, Emergency Drugs available, Suction available and Patient being monitored Patient Re-evaluated:Patient Re-evaluated prior to induction Oxygen Delivery Method: Circle System Utilized Preoxygenation: Pre-oxygenation with 100% oxygen Induction Type: IV induction Ventilation: Mask ventilation without difficulty Laryngoscope Size: Mac and 3 Grade View: Grade II Tube type: Oral Tube size: 7.0 mm Number of attempts: 1 Airway Equipment and Method: Stylet and Oral airway Placement Confirmation: ETT inserted through vocal cords under direct vision,  positive ETCO2 and breath sounds checked- equal and bilateral Secured at: 22 cm Tube secured with: Tape Dental Injury: Teeth and Oropharynx as per pre-operative assessment

## 2017-12-10 NOTE — Transfer of Care (Signed)
Immediate Anesthesia Transfer of Care Note  Patient: [name]  Procedure(s) Performed: ENDOSCOPIC RETROGRADE CHOLANGIOPANCREATOGRAPHY (ERCP) (N/A )  Patient Location: Endoscopy Unit  Anesthesia Type:General  Level of Consciousness: drowsy and patient cooperative  Airway & Oxygen Therapy: Patient Spontanous Breathing and Patient connected to face mask oxygen  Post-op Assessment: Report given to RN and Post -op Vital signs reviewed and stable  Post vital signs: Reviewed and stable  Last Vitals:  Vitals Value Taken Time  BP 167/64 12/10/2017 11:55 AM  Temp 36.7 C 12/10/2017 11:55 AM  Pulse 66 12/10/2017 11:59 AM  Resp 16 12/10/2017 11:59 AM  SpO2 100 % 12/10/2017 11:59 AM  Vitals shown include unvalidated device data.  Last Pain:  Vitals:   12/10/17 1155  TempSrc: Oral  PainSc: 0-No pain      Patients Stated Pain Goal: 2 (81/82/99 3716)  Complications: No apparent anesthesia complications

## 2017-12-10 NOTE — Progress Notes (Signed)
PROGRESS NOTE                                                                                                                                                                                                             Patient Demographics:    [name], is a 84 y.o. female, DOB - 07/17/1930, LHT:[id number]  Admit date - 12/08/2017   Admitting Physician Rise Patience, MD  Outpatient Primary MD for the patient is Lajean Manes, MD  LOS - 2  Chief Complaint  Patient presents with  . Abnormal Lab    AST/ALT        Brief Narrative   [name] is a 84 y.o. female with history of CAD status post CABG, hypertension presents to the ER at [facility] with complaints of abdominal pain nausea vomiting.  Patient has been having these symptoms for last 2 days, CT scan suggested CBD stone and blockage.  She was admitted for further treatment.   Subjective:   Patient in bed, appears comfortable, denies any headache, no fever, no chest pain or pressure, no shortness of breath , improved abdominal pain. No focal weakness.    Assessment  & Plan :     1.  Acute cholangitis due to CBD stone and obstruction.  Currently on bowel rest, gentle IV fluids, IV cephalosporin, likely stable GI taking her for ERCP on 12/10/2017, pain improved, Plavix on hold.  2.  Mild non-STEMI due to demand ischemia from stress from #1 above.  Currently chest pain-free, cardiology on board, echocardiogram shows a preserved EF of 55% although somewhat reduced since 2019 with no wall motion abnormalities, currently her Plavix is on hold due to #1 above.  Defer further management to cardiology.  3.  Hypertension.  Currently stable home dose beta-blocker and PRN hydralazine on board.  4.  Thrombocytopenia.  Placed on SCDs.  Too soon for HIT.  Most likely due to infection.  5.  Hypokalemia.  Replaced and stable.   Family Communication  :  None  Code Status :  Full  Disposition Plan  :  TBD  Consults  :   Cards, GI  Procedures  :    TTE - Compared to a prior study in 2018, the LVEF is now 55-60% without  gross regional wall motion abnormalities. Moderate eccentric MR.   There is diastolic dysfunction with elevated LV filling pressure  and moderate pulmonary hypertension.  CT - 1. Obstructing 12 mm choledocholithiasis suspected in the distal CBD.  Intra- and extrahepatic biliary ductal dilatation. 2. No other acute or inflammatory process identified in the abdomen or pelvis. 3. Distended urinary bladder with estimated volume of 566 mL. 4. Small chronic right pleural effusion. 5.  Aortic Atherosclerosis.   DVT Prophylaxis  :  SCDs   Lab Results  Component Value Date   PLT 103 (L) 12/10/2017    Diet :  Diet Order            Diet NPO time specified  Diet effective now               Inpatient Medications Scheduled Meds: . [MAR Hold] metoprolol succinate  50 mg Oral Daily   Continuous Infusions: . sodium chloride 20 mL/hr at 12/09/17 2323  . [MAR Hold] cefoTEtan (CEFOTAN) IV Stopped (12/09/17 2155)   PRN Meds:.[MAR Hold] fentaNYL (SUBLIMAZE) injection, [MAR Hold] hydrALAZINE, [MAR Hold] LORazepam, [MAR Hold]  morphine injection, [MAR Hold] nitroGLYCERIN, [MAR Hold] ondansetron **OR** [MAR Hold] ondansetron (ZOFRAN) IV  Antibiotics  :   Anti-infectives (From admission, onward)   Start     Dose/Rate Route Frequency Ordered Stop   12/08/17 2330  [MAR Hold]  cefoTEtan (CEFOTAN) 1 g in sodium chloride 0.9 % 100 mL IVPB     (MAR Hold since Fri 12/10/2017 at 0936. Reason: Transfer to a Procedural area.)   1 g 200 mL/hr over 30 Minutes Intravenous Every 12 hours 12/08/17 2254            Objective:   Vitals:   12/10/17 0000 12/10/17 0421 12/10/17 0718 12/10/17 0942  BP:  (!) 173/76 (!) 164/86 (!) 134/91  Pulse:  73 85 87  Resp:  12 17 16   Temp: 98.6 F (37 C) 98.1 F (36.7 C)  97.7 F (36.5 C)  TempSrc: Oral Oral  Oral  SpO2:  93% 95% 95%  Weight:      Height:         Wt Readings from Last 3 Encounters:  12/08/17 80.8 kg  12/08/17 74.8 kg  08/03/17 78.9 kg     Intake/Output Summary (Last 24 hours) at 12/10/2017 1057 Last data filed at 12/09/2017 2323 Gross per 24 hour  Intake 1009.52 ml  Output 400 ml  Net 609.52 ml     Physical Exam  Awake Alert, Oriented X 3, No new F.N deficits, Normal affect [place].AT,PERRAL Supple Neck,No JVD, No cervical lymphadenopathy appriciated.  Symmetrical Chest wall movement, Good air movement bilaterally, CTAB RRR,No Gallops, loud aortic systolic murmur, No Parasternal Heave +ve B.Sounds, Abd Soft, +ve RUQ tenderness, No organomegaly appriciated, No rebound - guarding or rigidity. No Cyanosis, Clubbing or edema, No new Rash or bruise       Data Review:    CBC Recent Labs  Lab 12/08/17 1027 12/09/17 0557 12/09/17 0856 12/10/17 0407  WBC 10.3 9.2 8.1 7.3  HGB 14.2 11.6* 11.6* 11.9*  HCT 42.3 35.8* 35.5* 36.9  PLT 142* 107* 100* 103*  MCV 89.8 91.6 90.6 91.6  MCH 30.1 29.7 29.6 29.5  MCHC 33.6 32.4 32.7 32.2  RDW 13.0 13.4 13.2 13.2  LYMPHSABS 0.9  --   --   --   MONOABS 0.3  --   --   --   EOSABS 0.0  --   --   --   BASOSABS 0.0  --   --   --     Chemistries  Recent Labs  Lab 12/08/17 1027 12/09/17 0350 12/09/17 0856 12/10/17 0407  NA 138 138 140 138  K  3.3* 2.9* 3.0* 3.8  CL 98 105 105 105  CO2 26 26 28 25   GLUCOSE 136* 129* 116* 152*  BUN 17 13 14 15   CREATININE 0.88 1.09* 1.04* 1.01*  CALCIUM 9.4 8.1* 8.3* 8.5*  MG  --   --   --  2.0  AST 927* 333* 280* 154*  ALT 707* 371* 343* 252*  ALKPHOS 185* 121 124 156*  BILITOT 8.4* 5.3* 5.7* 6.2*   ------------------------------------------------------------------------------------------------------------------ No results for input(s): CHOL, HDL, LDLCALC, TRIG, CHOLHDL, LDLDIRECT in the last 72 hours.  Lab Results  Component Value Date   HGBA1C 5.6 12/23/2016    ------------------------------------------------------------------------------------------------------------------ No results for input(s): TSH, T4TOTAL, T3FREE, THYROIDAB in the last 72 hours.  Invalid input(s): FREET3 ------------------------------------------------------------------------------------------------------------------ No results for input(s): VITAMINB12, FOLATE, FERRITIN, TIBC, IRON, RETICCTPCT in the last 72 hours.  Coagulation profile Recent Labs  Lab 12/09/17 0350 12/09/17 0557 12/10/17 0407  INR 1.27 1.48 1.09    No results for input(s): DDIMER in the last 72 hours.  Cardiac Enzymes Recent Labs  Lab 12/08/17 2323 12/09/17 0557 12/10/17 0407  TROPONINI 1.10* 1.17* 0.49*   ------------------------------------------------------------------------------------------------------------------    Component Value Date/Time   BNP 280 11/03/2013 1222    Micro Results Recent Results (from the past 240 hour(s))  Blood culture (routine x 2)     Status: None (Preliminary result)   Collection Time: 12/08/17  1:12 PM  Result Value Ref Range Status   Specimen Description BLOOD LEFT HAND  Final   Special Requests   Final    BOTTLES DRAWN AEROBIC AND ANAEROBIC Blood Culture adequate volume   Culture   Final    NO GROWTH 2 DAYS Performed at [facility], [address], [place], [place] [address]    Report Status PENDING  Incomplete  Blood culture (routine x 2)     Status: Abnormal (Preliminary result)   Collection Time: 12/08/17  1:12 PM  Result Value Ref Range Status   Specimen Description   Final    BLOOD RIGHT AC Performed at [facility], [address] [place], [place] [address]    Special Requests   Final    BOTTLES DRAWN AEROBIC AND ANAEROBIC Blood Culture adequate volume Performed at [facility], [address] [place], [place] [address]    Culture  Setup Time   Final    Organism ID to follow [place] AEROBIC AND ANAEROBIC BOTTLES CRITICAL RESULT CALLED TO, READ BACK BY AND VERIFIED WITHWilder Glade AT 5621 12/09/17 SDR Performed at [facility], [address], [place], [place] [address]    Culture (A)  Final    KLEBSIELLA OXYTOCA SUSCEPTIBILITIES TO FOLLOW Performed at [facility], [place] [address], [place], [place] [address]    Report Status PENDING  Incomplete  Blood Culture ID Panel (Reflexed)     Status: Abnormal   Collection Time: 12/08/17  1:12 PM  Result Value Ref Range Status   Enterococcus species NOT DETECTED NOT DETECTED Final   Listeria monocytogenes NOT DETECTED NOT DETECTED Final   Staphylococcus species NOT DETECTED NOT DETECTED Final   Staphylococcus aureus (BCID) NOT DETECTED NOT DETECTED Final   Streptococcus species NOT DETECTED NOT DETECTED Final   Streptococcus agalactiae NOT DETECTED NOT DETECTED Final   Streptococcus pneumoniae NOT DETECTED NOT DETECTED Final   Streptococcus pyogenes NOT DETECTED NOT DETECTED Final   Acinetobacter baumannii NOT DETECTED NOT DETECTED Final   Enterobacteriaceae species DETECTED (A) NOT DETECTED  Final    Comment: Enterobacteriaceae represent a large family of gram-negative bacteria, not a single organism. CRITICAL RESULT CALLED TO, READ BACK BY AND VERIFIED WITH:  CASSEY BAILEY AT 1540 12/09/17 SDR    Enterobacter cloacae complex NOT DETECTED NOT DETECTED Final   Escherichia coli NOT DETECTED NOT DETECTED Final   Klebsiella oxytoca DETECTED (A) NOT DETECTED Final    Comment: CRITICAL RESULT CALLED TO, READ BACK BY AND VERIFIED WITH:  CASSEY BAILEY AT 0867 12/09/17 SDR    Klebsiella pneumoniae NOT DETECTED NOT DETECTED Final   Proteus species NOT DETECTED NOT DETECTED Final   Serratia marcescens NOT DETECTED NOT DETECTED Final   Carbapenem resistance NOT DETECTED NOT DETECTED Final   Haemophilus influenzae NOT DETECTED NOT DETECTED Final   Neisseria meningitidis NOT DETECTED NOT DETECTED Final    Pseudomonas aeruginosa NOT DETECTED NOT DETECTED Final   Candida albicans NOT DETECTED NOT DETECTED Final   Candida glabrata NOT DETECTED NOT DETECTED Final   Candida krusei NOT DETECTED NOT DETECTED Final   Candida parapsilosis NOT DETECTED NOT DETECTED Final   Candida tropicalis NOT DETECTED NOT DETECTED Final    Comment: Performed at [facility], [address], [place], [place] [address]    Radiology Reports Dg Chest 2 View  Result Date: 12/08/2017 CLINICAL DATA:  Chest pain. Right upper quadrant pain. Nausea and vomiting. EXAM: CHEST - 2 VIEW COMPARISON:  07/03/2017 FINDINGS: Sequelae of prior CABG and right lower lobectomy are again identified with volume loss in the right hemithorax. The cardiac silhouette remains mildly enlarged. Aortic atherosclerosis is noted. No airspace consolidation, edema, or pneumothorax is identified. There is chronic blunting of the right costophrenic angle which may reflect a small pleural effusion or pleural thickening, unchanged. No acute osseous abnormality is identified. IMPRESSION: Small chronic right pleural effusion or pleural thickening without evidence of acute airspace disease. Electronically Signed   By: Logan Bores M.D.   On: 12/08/2017 11:19   Ct Abdomen Pelvis W Contrast  Result Date: 12/08/2017 CLINICAL DATA:  84 year old female with nausea vomiting and right upper quadrant abdominal pain for 2 days. History of lung cancer. Dilated CBD and intrahepatic ducts on right upper quadrant ultrasound today. EXAM: CT ABDOMEN AND PELVIS WITH CONTRAST TECHNIQUE: Multidetector CT imaging of the abdomen and pelvis was performed using the standard protocol following bolus administration of intravenous contrast. CONTRAST:  134mL ISOVUE-300 IOPAMIDOL (ISOVUE-300) INJECTION 61% COMPARISON:  Right upper quadrant ultrasound 1150 hours today. CT Abdomen and Pelvis 12/21/2016. FINDINGS: Lower chest: Cardiomegaly and lung bases are stable since 2018,  including a small layering right pleural effusion. Hepatobiliary: Intra- and extrahepatic biliary ductal dilatation. The CBD is up to 16 millimeters. In the distal CBD at the level of the pancreas there is a 12 millimeter rounded hyperdense filling defect with stippled fat density compatible with choledocholithiasis. See coronal image 36 and sagittal image 65. This is just proximal to the ampullary. Surgically absent gallbladder. Pancreas: Negative; no pancreatic inflammation or main pancreatic ductal dilatation. Spleen: Stable and negative; scattered small calcified granulomas. Adrenals/Urinary Tract: Normal adrenal glands. Bilateral renal enhancement and contrast excretion is symmetric and within normal limits. Extrarenal pelvis, normal variant. Decompressed ureters. The bladder is distended but otherwise unremarkable. Estimated bladder volume is 566 milliliters. Stomach/Bowel: Mild gaseous distension of the rectum. Decompressed and negative sigmoid colon, descending colon, redundant transverse colon. Negative right colon. The cecum is on a lax mesentery located anteriorly. The appendix is diminutive or absent. Negative terminal ileum. No dilated small  bowel. Small gastric hiatal hernia suspected. Negative intraabdominal stomach. No abnormality of the duodenum. No free air, free fluid. Vascular/Lymphatic: Severe Aortoiliac calcified atherosclerosis. The major arterial structures in the abdomen and pelvis remain patent. Portal venous system is patent. No lymphadenopathy. Reproductive: Surgically absent. Other: No pelvic free fluid. Musculoskeletal: No acute osseous abnormality identified. IMPRESSION: 1. Obstructing 12 mm choledocholithiasis suspected in the distal CBD. Intra- and extrahepatic biliary ductal dilatation. 2. No other acute or inflammatory process identified in the abdomen or pelvis. 3. Distended urinary bladder with estimated volume of 566 mL. 4. Small chronic right pleural effusion. 5.  Aortic  Atherosclerosis (ICD10-I70.0). Electronically Signed   By: Genevie Ann M.D.   On: 12/08/2017 12:36   Ct Hip Right Wo Contrast  Result Date: 11/11/2017 CLINICAL DATA:  Right hip and thigh pain for the past few weeks. Fall a few months ago. Evaluate for fracture. EXAM: CT OF THE RIGHT HIP WITHOUT CONTRAST TECHNIQUE: Multidetector CT imaging of the right hip was performed according to the standard protocol. Multiplanar CT image reconstructions were also generated. COMPARISON:  CT abdomen pelvis dated December 21, 2016. FINDINGS: Bones/Joint/Cartilage No fracture or dislocation. No periosteal reaction. Normal alignment. No joint effusion. Mild right hip joint space narrowing with small marginal osteophytes. Ligaments Ligaments are suboptimally evaluated by CT. Muscles and Tendons Gluteus minimus muscle atrophy. The gluteal, hamstring, and iliopsoas tendons are grossly intact. Soft tissue No fluid collection or hematoma. No soft tissue mass. Aortoiliac atherosclerotic vascular disease. Prior hysterectomy. IMPRESSION: 1.  No acute osseous abnormality. 2. Mild right hip osteoarthritis. Electronically Signed   By: Titus Dubin M.D.   On: 11/11/2017 15:23   US Abdomen Limited Ruq  Result Date: 12/08/2017 CLINICAL DATA:  84 year old female with elevated LFTs. EXAM: ULTRASOUND ABDOMEN LIMITED RIGHT UPPER QUADRANT COMPARISON:  CT Abdomen and Pelvis today reported separately. CT Abdomen and Pelvis 12/21/2016. FINDINGS: Gallbladder: Surgically absent. Common bile duct: Diameter: Up to 17 millimeters (dilated) on image 5. However, the CBD was 14 millimeters in 2018. No obstructing lesion identified within the visible portion of the duct. Liver: Intrahepatic ductal dilatation (image 24). Liver echogenicity within normal limits. No discrete liver lesion. Portal vein is patent on color Doppler imaging with normal direction of blood flow towards the liver. Other findings: A small right pleural effusion is noted on image 42.  Negative visible right kidney. IMPRESSION: 1. Dilated CBD and intrahepatic biliary ductal dilatation, similar to but greater than that by CT in 2018 which appeared due to choledocholithiasis. 2. Small right pleural effusion. Electronically Signed   By: Genevie Ann M.D.   On: 12/08/2017 12:17    Time Spent in minutes  30   Lala Lund M.D on 12/10/2017 at 10:57 AM  To page go to [email or web address] - password [facility]

## 2017-12-10 NOTE — Op Note (Addendum)
[facility] Patient Name: [name] Procedure Date : 12/10/2017 MRN: [id number] Attending MD: Justice Britain , MD Date of Birth: 12/05/1930 CSN: [id number] Age: 84 Admit Type: Inpatient Procedure:                ERCP Indications:              Bile duct stone(s), Ascending cholangitis,                            Jaundice, Elevated liver enzymes Providers:                Justice Britain, MD, Carmie End, RN, Elspeth Cho Tech., Technician, Derinda Sis, CRNA Referring MD:             Azucena Freed PA, PA, Ronald Lobo, MD, Thurnell Lose, Scarlette Shorts, MD Medicines:                Ceftriaxone x1 dose, Indomethacin 782 mg PR Complications:            No immediate complications. Estimated Blood Loss:     Estimated blood loss: none. Procedure:                Pre-Anesthesia Assessment:                           - Prior to the procedure, a History and Physical                            was performed, and patient medications and                            allergies were reviewed. The patient's tolerance of                            previous anesthesia was also reviewed. The risks                            and benefits of the procedure and the sedation                            options and risks were discussed with the patient.                            All questions were answered, and informed consent                            was obtained. Prior Anticoagulants: The patient has                            taken Plavix (clopidogrel), last dose was 2 days  prior to procedure. ASA Grade Assessment: III - A                            patient with severe systemic disease. After                            reviewing the risks and benefits, the patient was                            deemed in satisfactory condition to undergo the                            procedure.                            After obtaining informed consent, the scope was                            passed under direct vision. Throughout the                            procedure, the patient's blood pressure, pulse, and                            oxygen saturations were monitored continuously. The                            ERCP was accomplished without difficulty. The                            patient tolerated the procedure. The [id number]-[id number]                            ([id number] Olympus Duodensocope was introduced                            through the mouth, and used to inject contrast into                            and used to inject contrast into the bile duct. Scope In: Scope Out: Findings:      A scout film of the abdomen was obtained. Surgical clips, consistent       with a previous cholecystectomy, were seen in the area of the right       upper quadrant of the abdomen.      The esophagus was successfully intubated under direct vision without       detailed examination of the pharynx, larynx, and associated structures,       and upper GI tract. The upper GI tract was grossly normal. The major       papilla was flat. Pus was emerging from the major papilla.      A short 0.035 inch Soft Jagwire was passed into the biliary tree. The       short-nosed traction sphincterotome was passed over the guidewire and       the bile duct was  then deeply cannulated. Contrast was injected, though       with the concern for cholangitis, we did not want to overinject. I       personally interpreted the bile duct images. Ductal flow of contrast was       adequate. Image quality was adequate. Contrast extended to the hepatic       ducts. Opacification of the entire biliary tree except for the cystic       duct and gallbladder was successful. The lower third of the main bile       duct contained a large filling defect thought to be a stone. The main       bile duct was severely dilated. The largest diameter was 17  mm. One 10       Fr by 7 cm plastic biliary stent with a single external flap and a       single internal flap was placed into the common bile duct. Bile and pus       flowed through the stent. The stent was in good position.      A pancreatogram was not performed.      The duodenoscope was withdrawn from the patient. Impression:               - The major papilla appeared to be flat. Pus was                            seen emerging from the major papilla.                           - A filling defect consistent with a stone was seen                            on the cholangiogram.                           - The entire main bile duct was severely dilated                            due to the stone.                           - The examination was suspicious for                            choledocholithiasis. Removal was not attempted due                            to Plavix only being stopped less than 48 hours                            before and in setting of cholangitis, a stent was                            inserted and traversed the large stone into the                            proximal CBD.  Recommendation:           - The patient will be observed post-procedure,                            until all discharge criteria are met.                           - Return patient to hospital ward for ongoing care.                           - Advance diet as tolerated.                           - Check liver enzymes (AST, ALT, alkaline                            phosphatase, bilirubin) in the morning.                           - Observe patient's clinical course.                           - Watch for pancreatitis, bleeding, perforation,                            and cholangitis.                           - From GI Perspective could be restarted on Plavix                            later today.                           - Repeat ERCP in 6-8 weeks for retreatment.                           -  Antibiotics as per Medical service in regards to                            treatment of bacteremia, but would at least                            consider 5-days worth.                           - Patient will need to be off Plavix for 5-days                            prior to procedure and potentially 2-3 days after                            based on large sphincterotomy and sphincteroplasty  that will be required in attempt of removing the                            stone.                           - The findings and recommendations were discussed                            with the patient.                           - The findings and recommendations were discussed                            with the referring physician. Procedure Code(s):        --- Professional ---                           [phone], Endoscopic retrograde                            cholangiopancreatography (ERCP); with placement of                            endoscopic stent into biliary or pancreatic duct,                            including pre- and post-dilation and guide wire                            passage, when performed, including sphincterotomy,                            when performed, each stent Diagnosis Code(s):        --- Professional ---                           K83.8, Other specified diseases of biliary tract                           K80.30, Calculus of bile duct with cholangitis,                            unspecified, without obstruction                           K83.09, Other cholangitis                           R17, Unspecified jaundice                           R74.8, Abnormal levels of other serum enzymes                           R93.2, Abnormal findings on diagnostic imaging of  liver and biliary tract CPT copyright 2018 [employer]. All rights reserved. The codes documented in this report are preliminary and upon  coder review may  be revised to meet current compliance requirements. Justice Britain, MD 12/10/2017 11:51:07 AM Number of Addenda: 0

## 2017-12-10 NOTE — Progress Notes (Signed)
  Echocardiogram 2D Echocardiogram has been performed.  Jannett Celestine 12/10/2017, 8:50 AM

## 2017-12-10 NOTE — Interval H&P Note (Signed)
History and Physical Interval Note:  12/10/2017 10:28 AM  [name]  has presented today for surgery, with the diagnosis of Choledocholithiasis  The various methods of treatment have been discussed with the patient and family. After consideration of risks, benefits and other options for treatment, the patient has consented to  Procedure(s): ENDOSCOPIC RETROGRADE CHOLANGIOPANCREATOGRAPHY (ERCP) (N/A) as a surgical intervention .  The patient's history has been reviewed, patient examined, no change in status, stable for surgery.  I have reviewed the patient's chart and labs.  Questions were answered to the patient's satisfaction.    The risks of an ERCP were discussed at length, including but not limited to the risk of perforation, bleeding, abdominal pain, post-ERCP pancreatitis (while usually mild can be severe and even life threatening).     [employer]

## 2017-12-10 NOTE — Plan of Care (Signed)

## 2017-12-11 DIAGNOSIS — R17 Unspecified jaundice: Secondary | ICD-10-CM

## 2017-12-11 DIAGNOSIS — R932 Abnormal findings on diagnostic imaging of liver and biliary tract: Secondary | ICD-10-CM

## 2017-12-11 LAB — CBC
HEMATOCRIT: 32.3 % — AB (ref 36.0–46.0)
Hemoglobin: 10.7 g/dL — ABNORMAL LOW (ref 12.0–15.0)
MCH: 30.1 pg (ref 26.0–34.0)
MCHC: 33.1 g/dL (ref 30.0–36.0)
MCV: 90.7 fL (ref 80.0–100.0)
Platelets: 123 10*3/uL — ABNORMAL LOW (ref 150–400)
RBC: 3.56 MIL/uL — ABNORMAL LOW (ref 3.87–5.11)
RDW: 13.5 % (ref 11.5–15.5)
WBC: 6.5 10*3/uL (ref 4.0–10.5)
nRBC: 0 % (ref 0.0–0.2)

## 2017-12-11 LAB — COMPREHENSIVE METABOLIC PANEL
ALT: 163 U/L — ABNORMAL HIGH (ref 0–44)
AST: 79 U/L — ABNORMAL HIGH (ref 15–41)
Albumin: 2.3 g/dL — ABNORMAL LOW (ref 3.5–5.0)
Alkaline Phosphatase: 154 U/L — ABNORMAL HIGH (ref 38–126)
Anion gap: 9 (ref 5–15)
BUN: 17 mg/dL (ref 8–23)
CHLORIDE: 103 mmol/L (ref 98–111)
CO2: 27 mmol/L (ref 22–32)
Calcium: 8.2 mg/dL — ABNORMAL LOW (ref 8.9–10.3)
Creatinine, Ser: 0.82 mg/dL (ref 0.44–1.00)
Glucose, Bld: 100 mg/dL — ABNORMAL HIGH (ref 70–99)
Potassium: 3.1 mmol/L — ABNORMAL LOW (ref 3.5–5.1)
Sodium: 139 mmol/L (ref 135–145)
Total Bilirubin: 2.4 mg/dL — ABNORMAL HIGH (ref 0.3–1.2)
Total Protein: 5.4 g/dL — ABNORMAL LOW (ref 6.5–8.1)

## 2017-12-11 LAB — LIPASE, BLOOD: Lipase: 31 U/L (ref 11–51)

## 2017-12-11 MED ORDER — POTASSIUM CHLORIDE CRYS ER 20 MEQ PO TBCR
40.0000 meq | EXTENDED_RELEASE_TABLET | Freq: Once | ORAL | Status: AC
  Administered 2017-12-11: 40 meq via ORAL
  Filled 2017-12-11: qty 2

## 2017-12-11 MED ORDER — CLOPIDOGREL BISULFATE 75 MG PO TABS
75.0000 mg | ORAL_TABLET | Freq: Every day | ORAL | Status: DC
  Administered 2017-12-11 – 2017-12-12 (×2): 75 mg via ORAL
  Filled 2017-12-11 (×2): qty 1

## 2017-12-11 MED ORDER — POTASSIUM CHLORIDE CRYS ER 20 MEQ PO TBCR
20.0000 meq | EXTENDED_RELEASE_TABLET | Freq: Once | ORAL | Status: AC
  Administered 2017-12-11: 20 meq via ORAL

## 2017-12-11 MED ORDER — TRAMADOL HCL 50 MG PO TABS
50.0000 mg | ORAL_TABLET | Freq: Once | ORAL | Status: DC
  Filled 2017-12-11: qty 1

## 2017-12-11 MED ORDER — ACETAMINOPHEN 325 MG PO TABS
650.0000 mg | ORAL_TABLET | Freq: Four times a day (QID) | ORAL | Status: DC | PRN
  Administered 2017-12-11: 650 mg via ORAL
  Filled 2017-12-11 (×2): qty 2

## 2017-12-11 NOTE — Progress Notes (Signed)
Daily Rounding Note  12/11/2017, 11:22 AM  LOS: 3 days   SUBJECTIVE:   Chief complaint:  Choledocholithiasis and ascending cholangitis.    Pt feels full, no nausea, not eating a lot because of fullness. Tolerating solid food.  No abdominal pain.  OBJECTIVE:         Vital signs in last 24 hours:    Temp:  [97.8 F (36.6 C)-98.5 F (36.9 C)] 98.3 F (36.8 C) (11/30 0801) Pulse Rate:  [63-89] 79 (11/30 0801) Resp:  [15-27] 21 (11/30 0801) BP: (130-187)/(59-105) 162/66 (11/30 0801) SpO2:  [92 %-100 %] 93 % (11/30 0801) Weight:  [83.3 kg] 83.3 kg (11/30 0438) Last BM Date: 12/10/17(per patient had a small bm) Filed Weights   12/08/17 2056 12/11/17 0438  Weight: 80.8 kg 83.3 kg   General: looks good, a bit tired   Heart: RRR Chest: clear bil.  Overall reduced but clear BS Abdomen: soft, mild tenderness across upper abdomen, no guard or rebound.  BS present but reduced.  No tinkling or tympanitis BS  Extremities: no CCE Neuro/Psych:  Alert.  Appropriate.  No gross deficits.    Intake/Output from previous day: 11/29 0701 - 11/30 0700 In: 1000 [P.O.:300; I.V.:700] Out: 160 [Urine:150; Blood:10]  Intake/Output this shift: Total I/O In: 400 [P.O.:400] Out: -   Lab Results: Recent Labs    12/09/17 0856 12/10/17 0407 12/11/17 0350  WBC 8.1 7.3 6.5  HGB 11.6* 11.9* 10.7*  HCT 35.5* 36.9 32.3*  PLT 100* 103* 123*   BMET Recent Labs    12/09/17 0856 12/10/17 0407 12/11/17 0350  NA 140 138 139  K 3.0* 3.8 3.1*  CL 105 105 103  CO2 28 25 27   GLUCOSE 116* 152* 100*  BUN 14 15 17   CREATININE 1.04* 1.01* 0.82  CALCIUM 8.3* 8.5* 8.2*   LFT Recent Labs    12/09/17 0350 12/09/17 0856 12/10/17 0407 12/11/17 0350  PROT 5.3* 5.8* 5.9* 5.4*  ALBUMIN 2.7* 2.8* 2.6* 2.3*  AST 333* 280* 154* 79*  ALT 371* 343* 252* 163*  ALKPHOS 121 124 156* 154*  BILITOT 5.3* 5.7* 6.2* 2.4*  BILIDIR 3.0*  --   --   --     IBILI 2.3*  --   --   --    PT/INR Recent Labs    12/09/17 0557 12/10/17 0407  LABPROT 17.8* 14.1  INR 1.48 1.09   Hepatitis Panel Recent Labs    12/08/17 1312  HEPBSAG Negative  HCVAB <0.1  HEPAIGM Negative  HEPBIGM Negative    Studies/Results: Dg Ercp  Result Date: 12/10/2017 CLINICAL DATA:  Choledocholithiasis EXAM: ERCP TECHNIQUE: Multiple spot images obtained with the fluoroscopic device and submitted for interpretation post-procedure. COMPARISON:  CT 12/08/2017 FINDINGS: 2 fluoroscopic spot images document endoscopic cannulation and opacification of the CBD with passage of a guidewire. A large filling defect is noted in the distal CBD consistent with choledocholithiasis seen on prior CT. Cholecystectomy clips noted. There is limited opacification of the intrahepatic biliary tree. Final image documents placement of a plastic biliary stent in expected location. No evidence of extravasation. IMPRESSION: 1. Endoscopic CBD cannulation and intervention with stent placement. These images were submitted for radiologic interpretation only. Please see the procedural report for the amount of contrast and the fluoroscopy time utilized. Electronically Signed   By: Lucrezia Europe M.D.   On: 12/10/2017 12:05   Scheduled Meds: . clopidogrel  75 mg Oral Daily  . metoprolol  succinate  50 mg Oral Daily  . potassium chloride  20 mEq Oral Once  . traMADol  50 mg Oral Once   Continuous Infusions: . cefTRIAXone (ROCEPHIN)  IV 2 g (12/10/17 1317)   PRN Meds:.acetaminophen, fentaNYL (SUBLIMAZE) injection, hydrALAZINE, LORazepam, morphine injection, nitroGLYCERIN, ondansetron **OR** ondansetron (ZOFRAN) IV   ASSESMENT:   *  Choledocholithiasis.  Ascending cholangitis.   12/10/17 ERCP with CBD stone and pus.  S/p stent placement.  Removal/sphincterotomy not attempted due to Plavix stopped less than 48 hours efore procedure.  Marland Kitchen LFTs improved.       *  Chronic Plavix.  CABG 12/2016.  Hx PCI LAD  2001.  Hx TIA. Non-critical carotid stenosis. Started back on Plavix this morning.      *   Thrombocytopenia.  Dates back at least 1year.      PLAN   *  abx for another 4 days.    *  Repeat ERCP with aim to pursue stone extraction in 6 to 8 weeks when off Plavix for 5 days.  Dr Rush Landmark will arrange fup.      Azucena Freed  12/11/2017, 11:22 AM Phone [phone]

## 2017-12-11 NOTE — Progress Notes (Signed)
PROGRESS NOTE                                                                                                                                                                                                             Patient Demographics:    [name], is a 84 y.o. female, DOB - 1930-10-17, FHL:[id number]  Admit date - 12/08/2017   Admitting Physician Rise Patience, MD  Outpatient Primary MD for the patient is Lajean Manes, MD  LOS - 3  Chief Complaint  Patient presents with  . Abnormal Lab    AST/ALT        Brief Narrative   [name] is a 84 y.o. female with history of CAD status post CABG, hypertension presents to the ER at [facility] with complaints of abdominal pain nausea vomiting.  Patient has been having these symptoms for last 2 days, CT scan suggested CBD stone and blockage.  She was admitted for further treatment.   Subjective:   Patient in bed, appears comfortable, denies any headache, no fever, no chest pain or pressure, no shortness of breath , improved abdominal pain. No focal weakness.    Assessment  & Plan :     1.  Acute cholangitis due to CBD stone and obstruction.  She was treated with IV fluids and IV antibiotics which will be continued, seen by GI underwent ERCP on 12/10/2017 with large CBD dilation suggesting intraductal stone, she underwent stent placement but no stone removal or sphincterotomy was attempted as she was on Plavix, clinically much improved plan is to repeat ERCP in 5 to 6 weeks will defer to GI.  2.  Mild non-STEMI due to demand ischemia from stress from #1 above.  Currently chest pain-free, cardiology on board, echocardiogram shows a preserved EF of 55% although somewhat reduced since 2019 with no wall motion abnormalities, Plavix has been resumed she is at baseline.  Cardiology following.  3.  Hypertension.  Currently stable home dose beta-blocker and PRN hydralazine on board.  4.  Thrombocytopenia.  Placed on SCDs.  Too  soon for HIT.  Most likely due to infection.  5.  Hypokalemia.  Replaced and stable.   Family Communication  :  None  Code Status :  Full  Disposition Plan  :  TBD  Consults  :  Cards, GI  Procedures  :    ERCP 12/10/17 by Dr Rush Landmark -large CBD stone noted with stent placement.  No sphincterotomy or stone removal due to  recent Plavix use.  Plan is to repeat ERCP in a few weeks.  TTE - Compared to a prior study in 2018, the LVEF is now 55-60% without  gross regional wall motion abnormalities. Moderate eccentric MR.   There is diastolic dysfunction with elevated LV filling pressure  and moderate pulmonary hypertension.  CT - 1. Obstructing 12 mm choledocholithiasis suspected in the distal CBD. Intra- and extrahepatic biliary ductal dilatation. 2. No other acute or inflammatory process identified in the abdomen or pelvis. 3. Distended urinary bladder with estimated volume of 566 mL. 4. Small chronic right pleural effusion. 5.  Aortic Atherosclerosis.   DVT Prophylaxis  :  SCDs   Lab Results  Component Value Date   PLT 123 (L) 12/11/2017    Diet :  Diet Order            Diet Heart Room service appropriate? Yes; Fluid consistency: Thin  Diet effective now               Inpatient Medications Scheduled Meds: . clopidogrel  75 mg Oral Daily  . metoprolol succinate  50 mg Oral Daily  . potassium chloride  20 mEq Oral Once  . traMADol  50 mg Oral Once   Continuous Infusions: . cefTRIAXone (ROCEPHIN)  IV 2 g (12/10/17 1317)   PRN Meds:.fentaNYL (SUBLIMAZE) injection, hydrALAZINE, LORazepam, morphine injection, nitroGLYCERIN, ondansetron **OR** ondansetron (ZOFRAN) IV  Antibiotics  :   Anti-infectives (From admission, onward)   Start     Dose/Rate Route Frequency Ordered Stop   12/10/17 1300  cefTRIAXone (ROCEPHIN) 2 g in sodium chloride 0.9 % 100 mL IVPB     2 g 200 mL/hr over 30 Minutes Intravenous Every 24 hours 12/10/17 1113     12/08/17 2330  cefoTEtan (CEFOTAN)  1 g in sodium chloride 0.9 % 100 mL IVPB  Status:  Discontinued     1 g 200 mL/hr over 30 Minutes Intravenous Every 12 hours 12/08/17 2254 12/10/17 1112          Objective:   Vitals:   12/11/17 0058 12/11/17 0358 12/11/17 0438 12/11/17 0801  BP:  (!) 144/63  (!) 162/66  Pulse:  66  79  Resp:  15  (!) 21  Temp: 97.9 F (36.6 C)  97.9 F (36.6 C) 98.3 F (36.8 C)  TempSrc: Oral  Oral Oral  SpO2:  92%  93%  Weight:   83.3 kg   Height:        Wt Readings from Last 3 Encounters:  12/11/17 83.3 kg  12/08/17 74.8 kg  08/03/17 78.9 kg     Intake/Output Summary (Last 24 hours) at 12/11/2017 1118 Last data filed at 12/11/2017 0900 Gross per 24 hour  Intake 1400 ml  Output 160 ml  Net 1240 ml     Physical Exam  Awake Alert, Oriented X 3, No new F.N deficits, Normal affect [place].AT,PERRAL Supple Neck,No JVD, No cervical lymphadenopathy appriciated.  Symmetrical Chest wall movement, Good air movement bilaterally, CTAB RRR,No Gallops, Rubs, loud aortic systolic murmur, No Parasternal Heave +ve B.Sounds, Abd Soft, mild right upper quadrant tenderness, No organomegaly appriciated, No rebound - guarding or rigidity. No Cyanosis, Clubbing or edema, No new Rash or bruise       Data Review:    CBC Recent Labs  Lab 12/08/17 1027 12/09/17 0557 12/09/17 0856 12/10/17 0407 12/11/17 0350  WBC 10.3 9.2 8.1 7.3 6.5  HGB 14.2 11.6* 11.6* 11.9* 10.7*  HCT 42.3 35.8* 35.5* 36.9 32.3*  PLT 142* 107* 100* 103* 123*  MCV 89.8 91.6 90.6 91.6 90.7  MCH 30.1 29.7 29.6 29.5 30.1  MCHC 33.6 32.4 32.7 32.2 33.1  RDW 13.0 13.4 13.2 13.2 13.5  LYMPHSABS 0.9  --   --   --   --   MONOABS 0.3  --   --   --   --   EOSABS 0.0  --   --   --   --   BASOSABS 0.0  --   --   --   --     Chemistries  Recent Labs  Lab 12/08/17 1027 12/09/17 0350 12/09/17 0856 12/10/17 0407 12/11/17 0350  NA 138 138 140 138 139  K 3.3* 2.9* 3.0* 3.8 3.1*  CL 98 105 105 105 103  CO2 26 26 28 25 27     GLUCOSE 136* 129* 116* 152* 100*  BUN 17 13 14 15 17   CREATININE 0.88 1.09* 1.04* 1.01* 0.82  CALCIUM 9.4 8.1* 8.3* 8.5* 8.2*  MG  --   --   --  2.0  --   AST 927* 333* 280* 154* 79*  ALT 707* 371* 343* 252* 163*  ALKPHOS 185* 121 124 156* 154*  BILITOT 8.4* 5.3* 5.7* 6.2* 2.4*   ------------------------------------------------------------------------------------------------------------------ No results for input(s): CHOL, HDL, LDLCALC, TRIG, CHOLHDL, LDLDIRECT in the last 72 hours.  Lab Results  Component Value Date   HGBA1C 5.6 12/23/2016   ------------------------------------------------------------------------------------------------------------------ No results for input(s): TSH, T4TOTAL, T3FREE, THYROIDAB in the last 72 hours.  Invalid input(s): FREET3 ------------------------------------------------------------------------------------------------------------------ No results for input(s): VITAMINB12, FOLATE, FERRITIN, TIBC, IRON, RETICCTPCT in the last 72 hours.  Coagulation profile Recent Labs  Lab 12/09/17 0350 12/09/17 0557 12/10/17 0407  INR 1.27 1.48 1.09    No results for input(s): DDIMER in the last 72 hours.  Cardiac Enzymes Recent Labs  Lab 12/08/17 2323 12/09/17 0557 12/10/17 0407  TROPONINI 1.10* 1.17* 0.49*   ------------------------------------------------------------------------------------------------------------------    Component Value Date/Time   BNP 280 11/03/2013 1222    Micro Results Recent Results (from the past 240 hour(s))  Blood culture (routine x 2)     Status: None (Preliminary result)   Collection Time: 12/08/17  1:12 PM  Result Value Ref Range Status   Specimen Description BLOOD LEFT HAND  Final   Special Requests   Final    BOTTLES DRAWN AEROBIC AND ANAEROBIC Blood Culture adequate volume   Culture   Final    NO GROWTH 3 DAYS Performed at [facility], [address], [place], [place] [address]    Report  Status PENDING  Incomplete  Blood culture (routine x 2)     Status: Abnormal   Collection Time: 12/08/17  1:12 PM  Result Value Ref Range Status   Specimen Description   Final    BLOOD RIGHT [facility] Performed at [facility], [address], [place], [place] [address]    Special Requests   Final    BOTTLES DRAWN AEROBIC AND ANAEROBIC Blood Culture adequate volume Performed at [facility], [address] [place], [place] [address]    Culture  Setup Time   Final    Organism ID to follow [place] AEROBIC AND ANAEROBIC BOTTLES CRITICAL RESULT CALLED TO, READ BACK BY AND VERIFIED WITHWilder Glade AT 0865 12/09/17 [place] Performed at [facility], [address], [place], [place] [address]    Culture KLEBSIELLA OXYTOCA (A)  Final   Report Status 12/11/2017 FINAL  Final  Blood Culture ID Panel (Reflexed)     Status: Abnormal   Collection Time: 12/08/17  1:12 PM  Result Value Ref Range Status   Enterococcus species NOT DETECTED NOT DETECTED Final   Listeria monocytogenes NOT DETECTED NOT DETECTED Final   Staphylococcus species NOT DETECTED NOT DETECTED Final   Staphylococcus aureus (BCID) NOT DETECTED NOT DETECTED Final   Streptococcus species NOT DETECTED NOT DETECTED Final   Streptococcus agalactiae NOT DETECTED NOT DETECTED Final   Streptococcus pneumoniae NOT DETECTED NOT DETECTED Final   Streptococcus pyogenes NOT DETECTED NOT DETECTED Final   Acinetobacter baumannii NOT DETECTED NOT DETECTED Final   Enterobacteriaceae species DETECTED (A) NOT DETECTED Final    Comment: Enterobacteriaceae represent a large family of gram-negative bacteria, not a single organism. CRITICAL RESULT CALLED TO, READ BACK BY AND VERIFIED WITH:  CASSEY BAILEY AT 6789 12/09/17 SDR    Enterobacter cloacae complex NOT DETECTED NOT DETECTED Final   Escherichia coli NOT DETECTED NOT DETECTED Final   Klebsiella oxytoca DETECTED (A) NOT DETECTED Final    Comment:  CRITICAL RESULT CALLED TO, READ BACK BY AND VERIFIED WITH:  CASSEY BAILEY AT 3810 12/09/17 SDR    Klebsiella pneumoniae NOT DETECTED NOT DETECTED Final   Proteus species NOT DETECTED NOT DETECTED Final   Serratia marcescens NOT DETECTED NOT DETECTED Final   Carbapenem resistance NOT DETECTED NOT DETECTED Final   Haemophilus influenzae NOT DETECTED NOT DETECTED Final   Neisseria meningitidis NOT DETECTED NOT DETECTED Final   Pseudomonas aeruginosa NOT DETECTED NOT DETECTED Final   Candida albicans NOT DETECTED NOT DETECTED Final   Candida glabrata NOT DETECTED NOT DETECTED Final   Candida krusei NOT DETECTED NOT DETECTED Final   Candida parapsilosis NOT DETECTED NOT DETECTED Final   Candida tropicalis NOT DETECTED NOT DETECTED Final    Comment: Performed at [facility], [address], [place], [place] [address]    Radiology Reports Dg Chest 2 View  Result Date: 12/08/2017 CLINICAL DATA:  Chest pain. Right upper quadrant pain. Nausea and vomiting. EXAM: CHEST - 2 VIEW COMPARISON:  07/03/2017 FINDINGS: Sequelae of prior CABG and right lower lobectomy are again identified with volume loss in the right hemithorax. The cardiac silhouette remains mildly enlarged. Aortic atherosclerosis is noted. No airspace consolidation, edema, or pneumothorax is identified. There is chronic blunting of the right costophrenic angle which may reflect a small pleural effusion or pleural thickening, unchanged. No acute osseous abnormality is identified. IMPRESSION: Small chronic right pleural effusion or pleural thickening without evidence of acute airspace disease. Electronically Signed   By: Logan Bores M.D.   On: 12/08/2017 11:19   Ct Abdomen Pelvis W Contrast  Result Date: 12/08/2017 CLINICAL DATA:  84 year old female with nausea vomiting and right upper quadrant abdominal pain for 2 days. History of lung cancer. Dilated CBD and intrahepatic ducts on right upper quadrant ultrasound today. EXAM: CT  ABDOMEN AND PELVIS WITH CONTRAST TECHNIQUE: Multidetector CT imaging of the abdomen and pelvis was performed using the standard protocol following bolus administration of intravenous contrast. CONTRAST:  140mL ISOVUE-300 IOPAMIDOL (ISOVUE-300) INJECTION 61% COMPARISON:  Right upper quadrant ultrasound 1150 hours today. CT Abdomen and Pelvis 12/21/2016. FINDINGS: Lower chest: Cardiomegaly and lung bases are stable since 2018, including a small layering right pleural effusion. Hepatobiliary: Intra- and extrahepatic biliary ductal dilatation. The CBD is up to 16 millimeters. In the distal CBD at the level of the pancreas there is a 12 millimeter rounded hyperdense filling defect with stippled fat density compatible  with choledocholithiasis. See coronal image 36 and sagittal image 65. This is just proximal to the ampullary. Surgically absent gallbladder. Pancreas: Negative; no pancreatic inflammation or main pancreatic ductal dilatation. Spleen: Stable and negative; scattered small calcified granulomas. Adrenals/Urinary Tract: Normal adrenal glands. Bilateral renal enhancement and contrast excretion is symmetric and within normal limits. Extrarenal pelvis, normal variant. Decompressed ureters. The bladder is distended but otherwise unremarkable. Estimated bladder volume is 566 milliliters. Stomach/Bowel: Mild gaseous distension of the rectum. Decompressed and negative sigmoid colon, descending colon, redundant transverse colon. Negative right colon. The cecum is on a lax mesentery located anteriorly. The appendix is diminutive or absent. Negative terminal ileum. No dilated small bowel. Small gastric hiatal hernia suspected. Negative intraabdominal stomach. No abnormality of the duodenum. No free air, free fluid. Vascular/Lymphatic: Severe Aortoiliac calcified atherosclerosis. The major arterial structures in the abdomen and pelvis remain patent. Portal venous system is patent. No lymphadenopathy. Reproductive:  Surgically absent. Other: No pelvic free fluid. Musculoskeletal: No acute osseous abnormality identified. IMPRESSION: 1. Obstructing 12 mm choledocholithiasis suspected in the distal CBD. Intra- and extrahepatic biliary ductal dilatation. 2. No other acute or inflammatory process identified in the abdomen or pelvis. 3. Distended urinary bladder with estimated volume of 566 mL. 4. Small chronic right pleural effusion. 5.  Aortic Atherosclerosis (ICD10-I70.0). Electronically Signed   By: Genevie Ann M.D.   On: 12/08/2017 12:36   Ct Hip Right Wo Contrast  Result Date: 11/11/2017 CLINICAL DATA:  Right hip and thigh pain for the past few weeks. Fall a few months ago. Evaluate for fracture. EXAM: CT OF THE RIGHT HIP WITHOUT CONTRAST TECHNIQUE: Multidetector CT imaging of the right hip was performed according to the standard protocol. Multiplanar CT image reconstructions were also generated. COMPARISON:  CT abdomen pelvis dated December 21, 2016. FINDINGS: Bones/Joint/Cartilage No fracture or dislocation. No periosteal reaction. Normal alignment. No joint effusion. Mild right hip joint space narrowing with small marginal osteophytes. Ligaments Ligaments are suboptimally evaluated by CT. Muscles and Tendons Gluteus minimus muscle atrophy. The gluteal, hamstring, and iliopsoas tendons are grossly intact. Soft tissue No fluid collection or hematoma. No soft tissue mass. Aortoiliac atherosclerotic vascular disease. Prior hysterectomy. IMPRESSION: 1.  No acute osseous abnormality. 2. Mild right hip osteoarthritis. Electronically Signed   By: Titus Dubin M.D.   On: 11/11/2017 15:23   Dg Ercp  Result Date: 12/10/2017 CLINICAL DATA:  Choledocholithiasis EXAM: ERCP TECHNIQUE: Multiple spot images obtained with the fluoroscopic device and submitted for interpretation post-procedure. COMPARISON:  CT 12/08/2017 FINDINGS: 2 fluoroscopic spot images document endoscopic cannulation and opacification of the CBD with passage of a  guidewire. A large filling defect is noted in the distal CBD consistent with choledocholithiasis seen on prior CT. Cholecystectomy clips noted. There is limited opacification of the intrahepatic biliary tree. Final image documents placement of a plastic biliary stent in expected location. No evidence of extravasation. IMPRESSION: 1. Endoscopic CBD cannulation and intervention with stent placement. These images were submitted for radiologic interpretation only. Please see the procedural report for the amount of contrast and the fluoroscopy time utilized. Electronically Signed   By: Lucrezia Europe M.D.   On: 12/10/2017 12:05   US Abdomen Limited Ruq  Result Date: 12/08/2017 CLINICAL DATA:  84 year old female with elevated LFTs. EXAM: ULTRASOUND ABDOMEN LIMITED RIGHT UPPER QUADRANT COMPARISON:  CT Abdomen and Pelvis today reported separately. CT Abdomen and Pelvis 12/21/2016. FINDINGS: Gallbladder: Surgically absent. Common bile duct: Diameter: Up to 17 millimeters (dilated) on image 5. However, the CBD was  14 millimeters in 2018. No obstructing lesion identified within the visible portion of the duct. Liver: Intrahepatic ductal dilatation (image 24). Liver echogenicity within normal limits. No discrete liver lesion. Portal vein is patent on color Doppler imaging with normal direction of blood flow towards the liver. Other findings: A small right pleural effusion is noted on image 42. Negative visible right kidney. IMPRESSION: 1. Dilated CBD and intrahepatic biliary ductal dilatation, similar to but greater than that by CT in 2018 which appeared due to choledocholithiasis. 2. Small right pleural effusion. Electronically Signed   By: Genevie Ann M.D.   On: 12/08/2017 12:17    Time Spent in minutes  30   Lala Lund M.D on 12/11/2017 at 11:18 AM  To page go to [email or web address] - password [facility]

## 2017-12-11 NOTE — Progress Notes (Signed)
Progress Note  Patient Name: [name] Date of Encounter: 12/11/2017  Primary Cardiologist: Ida Rogue, MD   Subjective   Doing better, no chest pain, no shortness of breath.  Post ERCP without sphincterotomy secondary to Plavix  Inpatient Medications    Scheduled Meds: . clopidogrel  75 mg Oral Daily  . metoprolol succinate  50 mg Oral Daily  . potassium chloride  20 mEq Oral Once  . traMADol  50 mg Oral Once   Continuous Infusions: . cefTRIAXone (ROCEPHIN)  IV 2 g (12/10/17 1317)   PRN Meds: fentaNYL (SUBLIMAZE) injection, hydrALAZINE, LORazepam, morphine injection, nitroGLYCERIN, ondansetron **OR** ondansetron (ZOFRAN) IV   Vital Signs    Vitals:   12/11/17 0058 12/11/17 0358 12/11/17 0438 12/11/17 0801  BP:  (!) 144/63  (!) 162/66  Pulse:  66  79  Resp:  15  (!) 21  Temp: 97.9 F (36.6 C)  97.9 F (36.6 C) 98.3 F (36.8 C)  TempSrc: Oral  Oral Oral  SpO2:  92%  93%  Weight:   83.3 kg   Height:        Intake/Output Summary (Last 24 hours) at 12/11/2017 1138 Last data filed at 12/11/2017 0900 Gross per 24 hour  Intake 1400 ml  Output 150 ml  Net 1250 ml   Filed Weights   12/08/17 2056 12/11/17 0438  Weight: 80.8 kg 83.3 kg    Telemetry    Normal sinus rhythm- Personally Reviewed  ECG    No new- Personally Reviewed  Physical Exam   GEN: No acute distress.   Neck: No JVD Cardiac: RRR, 2/6 apical systolic murmur, no rubs, or gallops.  Respiratory: Clear to auscultation bilaterally. GI: Soft, nontender, non-distended  MS: No edema; No deformity. Neuro:  Nonfocal  Psych: Normal affect   Labs    Chemistry Recent Labs  Lab 12/09/17 0856 12/10/17 0407 12/11/17 0350  NA 140 138 139  K 3.0* 3.8 3.1*  CL 105 105 103  CO2 28 25 27   GLUCOSE 116* 152* 100*  BUN 14 15 17   CREATININE 1.04* 1.01* 0.82  CALCIUM 8.3* 8.5* 8.2*  PROT 5.8* 5.9* 5.4*  ALBUMIN 2.8* 2.6* 2.3*  AST 280* 154* 79*  ALT 343* 252* 163*  ALKPHOS 124 156*  154*  BILITOT 5.7* 6.2* 2.4*  GFRNONAA 49* 51* NOT CALCULATED  GFRAA 57* 59* NOT CALCULATED  ANIONGAP 7 8 9      Hematology Recent Labs  Lab 12/09/17 0856 12/10/17 0407 12/11/17 0350  WBC 8.1 7.3 6.5  RBC 3.92 4.03 3.56*  HGB 11.6* 11.9* 10.7*  HCT 35.5* 36.9 32.3*  MCV 90.6 91.6 90.7  MCH 29.6 29.5 30.1  MCHC 32.7 32.2 33.1  RDW 13.2 13.2 13.5  PLT 100* 103* 123*    Cardiac Enzymes Recent Labs  Lab 12/08/17 1027 12/08/17 2323 12/09/17 0557 12/10/17 0407  TROPONINI 0.25* 1.10* 1.17* 0.49*   No results for input(s): TROPIPOC in the last 168 hours.   BNPNo results for input(s): BNP, PROBNP in the last 168 hours.   DDimer No results for input(s): DDIMER in the last 168 hours.   Radiology    Dg Ercp  Result Date: 12/10/2017 CLINICAL DATA:  Choledocholithiasis EXAM: ERCP TECHNIQUE: Multiple spot images obtained with the fluoroscopic device and submitted for interpretation post-procedure. COMPARISON:  CT 12/08/2017 FINDINGS: 2 fluoroscopic spot images document endoscopic cannulation and opacification of the CBD with passage of a guidewire. A large filling defect is noted in the distal CBD consistent with  choledocholithiasis seen on prior CT. Cholecystectomy clips noted. There is limited opacification of the intrahepatic biliary tree. Final image documents placement of a plastic biliary stent in expected location. No evidence of extravasation. IMPRESSION: 1. Endoscopic CBD cannulation and intervention with stent placement. These images were submitted for radiologic interpretation only. Please see the procedural report for the amount of contrast and the fluoroscopy time utilized. Electronically Signed   By: Lucrezia Europe M.D.   On: 12/10/2017 12:05    Cardiac Studies   Echo EF 55 to 60%, pulmonary artery pressures elevated 52 mmHg, moderate, moderate mitral regurgitation as well.  Patient Profile     84 y.o. female with coronary artery disease who underwent three-vessel CABG  in November 2018 with left ICA stenosis less than 50%, hypertension, PVCs, hyperlipidemia with common bile duct stone  Assessment & Plan    Type II myocardial infarction -Troponin mildly elevated from 0.25-1.17 now down to 0.49.  This is likely secondary to underlying coronary artery disease and supply demand mismatch given underlying CAD in the setting of current metabolic derangement. -Continue with medical management.  No ischemic changes noted on ECG personally reviewed and interpreted.  Coronary artery disease status post CABG 2018 - Plavix has been resumed post ERCP.  Unable to do sphincterotomy because Plavix hold was only 48 hours.  Plan is to repeat ERCP at later date.  At that point, as outpatient, would plan on holding Plavix for 5 days prior to procedure.  Continue with aspirin if possible.  Hypertension essential - Restarting Norvasc.  Mitral regurgitation -Moderate follow clinically.  Murmur heard on exam.  [facility] will sign off.   Medication Recommendations:  No new recs Other recommendations (labs, testing, etc):  none other than following lipid panel Follow up as an outpatient:  As sched  For questions or updates, please contact [facility] Please consult [email or web address] for contact info under        Signed, Candee Furbish, MD  12/11/2017, 11:38 AM

## 2017-12-11 NOTE — Evaluation (Signed)
Physical Therapy Evaluation Patient Details Name: SHERYLANN VANGORDEN MRN: [id number] DOB: 1930/11/24 Today's Date: 12/11/2017   History of Present Illness  Pt is an 84 y/o female admitted secondary to abdominal pain, nausea and vomitting. Pt found to have Acute cholangitis due to CBD stone and obstruction now s/p ERCP. Of note, cardiology was also consulted secondary to reports of chest pain and elevated troponin. Cardiology found pt to have had a type II myocardial infarct, likely secondary to underlying coronary artery disease and supply demand mismatch. Cards recommending medical management. PMH including but not limited to atypical carcinoid lung tumor, HTN and CAD s/p CABG in 2018.    Clinical Impression  Pt presented supine in bed with HOB elevated, awake and willing to participate in therapy session. Prior to admission, pt reported that she was independent with all functional mobility and ADLs. Pt lives with her children and grandchildren in a two level house with a few steps to enter. Pt currently able to perform transfers with min guard and ambulate in hallway with RW and min guard for safety. Pt denies any dizziness, SOB or chest pain throughout. Pt on RA throughout with SPO2 at 93%. Pt would continue to benefit from skilled physical therapy services at this time while admitted and after d/c to address the below listed limitations in order to improve overall safety and independence with functional mobility.     Follow Up Recommendations Home health PT;Supervision for mobility/OOB    Equipment Recommendations  Rolling walker with 5" wheels    Recommendations for Other Services       Precautions / Restrictions Precautions Precautions: Fall Restrictions Weight Bearing Restrictions: No      Mobility  Bed Mobility               General bed mobility comments: pt seated in recliner chair upon arrival  Transfers Overall transfer level: Needs assistance Equipment used: Rolling  walker (2 wheeled) Transfers: Sit to/from Stand Sit to Stand: Min guard         General transfer comment: min guard for safety, good technique demonstrated  Ambulation/Gait Ambulation/Gait assistance: Min guard Gait Distance (Feet): 100 Feet Assistive device: Rolling walker (2 wheeled) Gait Pattern/deviations: Step-through pattern;Decreased stride length Gait velocity: decreased   General Gait Details: pt steady with use of RW, no LOB or need for physical assistance, min guard for safety  Stairs            Wheelchair Mobility    Modified Rankin (Stroke Patients Only)       Balance Overall balance assessment: Needs assistance Sitting-balance support: Feet supported Sitting balance-Leahy Scale: Good     Standing balance support: During functional activity;Bilateral upper extremity supported Standing balance-Leahy Scale: Poor Standing balance comment: bilateral UEs on RW for support                             Pertinent Vitals/Pain Pain Assessment: No/denies pain    Home Living Family/patient expects to be discharged to:: Private residence Living Arrangements: Children;Other relatives Available Help at Discharge: Family;Available 24 hours/day Type of Home: House Home Access: Stairs to enter Entrance Stairs-Rails: Psychiatric nurse of Steps: 4 Home Layout: Two level Home Equipment: Shower seat      Prior Function Level of Independence: Independent               Hand Dominance        Extremity/Trunk Assessment   Upper Extremity Assessment  Upper Extremity Assessment: Overall WFL for tasks assessed    Lower Extremity Assessment Lower Extremity Assessment: Overall WFL for tasks assessed       Communication   Communication: No difficulties  Cognition Arousal/Alertness: Awake/alert Behavior During Therapy: WFL for tasks assessed/performed Overall Cognitive Status: Within Functional Limits for tasks assessed                                  General Comments: cognition not formally assessed but [facility] for general conversation      General Comments      Exercises     Assessment/Plan    PT Assessment Patient needs continued PT services  PT Problem List Decreased balance;Decreased mobility;Decreased coordination;Decreased knowledge of use of DME;Decreased safety awareness       PT Treatment Interventions DME instruction;Gait training;Stair training;Functional mobility training;Therapeutic activities;Therapeutic exercise;Balance training;Neuromuscular re-education;Patient/family education    PT Goals (Current goals can be found in the Care Plan section)  Acute Rehab PT Goals Patient Stated Goal: return home tomorrow PT Goal Formulation: With patient Time For Goal Achievement: 12/25/17 Potential to Achieve Goals: Good    Frequency Min 3X/week   Barriers to discharge        Co-evaluation               AM-PAC PT "6 Clicks" Mobility  Outcome Measure Help needed turning from your back to your side while in a flat bed without using bedrails?: None Help needed moving from lying on your back to sitting on the side of a flat bed without using bedrails?: None Help needed moving to and from a bed to a chair (including a wheelchair)?: None Help needed standing up from a chair using your arms (e.g., wheelchair or bedside chair)?: A Little Help needed to walk in hospital room?: A Little Help needed climbing 3-5 steps with a railing? : A Little 6 Click Score: 21    End of Session Equipment Utilized During Treatment: Gait belt Activity Tolerance: Patient tolerated treatment well Patient left: in chair;with call bell/phone within reach Nurse Communication: Mobility status PT Visit Diagnosis: Other abnormalities of gait and mobility (R26.89)    Time: 8588-5027 PT Time Calculation (min) (ACUTE ONLY): 18 min   Charges:   PT Evaluation $PT Eval Moderate Complexity: 1 Mod           Sherie Don, PT, DPT  Acute Rehabilitation Services Pager [phone] Office [facility] 12/11/2017, 1:51 PM

## 2017-12-12 ENCOUNTER — Encounter (HOSPITAL_COMMUNITY): Payer: Self-pay | Admitting: Gastroenterology

## 2017-12-12 LAB — COMPREHENSIVE METABOLIC PANEL
ALT: 133 U/L — ABNORMAL HIGH (ref 0–44)
AST: 50 U/L — AB (ref 15–41)
Albumin: 2.6 g/dL — ABNORMAL LOW (ref 3.5–5.0)
Alkaline Phosphatase: 154 U/L — ABNORMAL HIGH (ref 38–126)
Anion gap: 13 (ref 5–15)
BUN: 11 mg/dL (ref 8–23)
CO2: 25 mmol/L (ref 22–32)
Calcium: 8.5 mg/dL — ABNORMAL LOW (ref 8.9–10.3)
Chloride: 101 mmol/L (ref 98–111)
Creatinine, Ser: 0.78 mg/dL (ref 0.44–1.00)
GFR calc Af Amer: >60 mL/min (ref >60)
GFR calc non Af Amer: >60 mL/min (ref >60)
GLUCOSE: 115 mg/dL — AB (ref 70–99)
Potassium: 3.3 mmol/L — ABNORMAL LOW (ref 3.5–5.1)
Sodium: 139 mmol/L (ref 135–145)
Total Bilirubin: 2.1 mg/dL — ABNORMAL HIGH (ref 0.3–1.2)
Total Protein: 6.3 g/dL — ABNORMAL LOW (ref 6.5–8.1)

## 2017-12-12 LAB — CBC
HCT: 36.8 % (ref 36.0–46.0)
Hemoglobin: 11.8 g/dL — ABNORMAL LOW (ref 12.0–15.0)
MCH: 28.9 pg (ref 26.0–34.0)
MCHC: 32.1 g/dL (ref 30.0–36.0)
MCV: 90.2 fL (ref 80.0–100.0)
Platelets: 151 10*3/uL (ref 150–400)
RBC: 4.08 MIL/uL (ref 3.87–5.11)
RDW: 13.7 % (ref 11.5–15.5)
WBC: 7.6 10*3/uL (ref 4.0–10.5)
nRBC: 0 % (ref 0.0–0.2)

## 2017-12-12 LAB — LIPASE, BLOOD: Lipase: 28 U/L (ref 11–51)

## 2017-12-12 MED ORDER — AMLODIPINE BESYLATE 10 MG PO TABS
10.0000 mg | ORAL_TABLET | Freq: Every day | ORAL | Status: DC
  Administered 2017-12-12: 10 mg via ORAL
  Filled 2017-12-12: qty 1

## 2017-12-12 MED ORDER — CEPHALEXIN 500 MG PO CAPS: 500.0000 mg | ORAL_CAPSULE | Freq: Four times a day (QID) | ORAL | 0 refills | Status: AC

## 2017-12-12 MED ORDER — LORAZEPAM 0.5 MG PO TABS: 0.5000 mg | ORAL_TABLET | Freq: Three times a day (TID) | ORAL | Status: DC | PRN

## 2017-12-12 MED ORDER — POTASSIUM CHLORIDE CRYS ER 20 MEQ PO TBCR
40.0000 meq | EXTENDED_RELEASE_TABLET | Freq: Once | ORAL | Status: AC
  Administered 2017-12-12: 40 meq via ORAL
  Filled 2017-12-12: qty 2

## 2017-12-12 MED ORDER — ONDANSETRON HCL 4 MG PO TABS: 4.0000 mg | ORAL_TABLET | Freq: Three times a day (TID) | ORAL | 0 refills | Status: DC | PRN

## 2017-12-12 MED ORDER — METOPROLOL TARTRATE 5 MG/5ML IV SOLN
5.0000 mg | Freq: Once | INTRAVENOUS | Status: AC
  Administered 2017-12-12: 5 mg via INTRAVENOUS
  Filled 2017-12-12: qty 5

## 2017-12-12 NOTE — Care Management Note (Signed)
Case Management Note  Patient Details  Name: [name] MRN: [id number] Date of Birth: December 22, 1930  Subjective/Objective:                    Action/Plan:  Patient refused HH. Would like rollator. Placed order, it will be delivered to room prior to DC.  Expected Discharge Date:  12/12/17               Expected Discharge Plan:     In-House Referral:     Discharge planning Services  CM Consult  Post Acute Care Choice:  Durable Medical Equipment Choice offered to:     DME Arranged:  Walker rolling with seat DME Agency:  [facility] Arranged:  Patient Refused [place] Agency:     Status of Service:  Completed, signed off  If discussed at [place] of Stay Meetings, dates discussed:    Additional Comments:  Carles Collet, RN 12/12/2017, 12:27 PM

## 2017-12-12 NOTE — Progress Notes (Signed)
Daily Rounding Note  12/12/2017, 12:48 PM  LOS: 4 days   SUBJECTIVE:   No significant changes from yesterday today.  Had a chance to meet with the patient's son and grandson were here to pick her up.  She is feeling well wants to continue to take get home soon. No fevers or chills.  OBJECTIVE:         Vital signs in last 24 hours:    Temp:  [97.6 F (36.4 C)-99.8 F (37.7 C)] 99.8 F (37.7 C) (11/30 2154) Pulse Rate:  [64-81] 81 (12/01 0434) Resp:  [18] 18 (11/30 2154) BP: (161-180)/(70-76) 161/75 (12/01 0626) SpO2:  [95 %-96 %] 95 % (11/30 2154) Last BM Date: 12/10/17(per patient had a small bm) Filed Weights   12/08/17 2056 12/11/17 0438  Weight: 80.8 kg 83.3 kg  General: No acute distress, resting in her chair comfortably Heart: Non-tachycardic Chest: Decreased breath sounds at bases bilaterally Abdomen: Soft, protuberant, mild tenderness to palpation in the right upper quadrant region, no rebound or guarding Extremities: Lower extremity edema present Neuro/Psych: Alert and oriented x3, no focal deficits  Intake/Output from previous day: 11/30 0701 - 12/01 0700 In: 400 [P.O.:400] Out: -   Intake/Output this shift: No intake/output data recorded.  Lab Results: Recent Labs    12/10/17 0407 12/11/17 0350 12/12/17 0514  WBC 7.3 6.5 7.6  HGB 11.9* 10.7* 11.8*  HCT 36.9 32.3* 36.8  PLT 103* 123* 151   BMET Recent Labs    12/10/17 0407 12/11/17 0350 12/12/17 0514  NA 138 139 139  K 3.8 3.1* 3.3*  CL 105 103 101  CO2 25 27 25   GLUCOSE 152* 100* 115*  BUN 15 17 11   CREATININE 1.01* 0.82 0.78  CALCIUM 8.5* 8.2* 8.5*   LFT Recent Labs    12/10/17 0407 12/11/17 0350 12/12/17 0514  PROT 5.9* 5.4* 6.3*  ALBUMIN 2.6* 2.3* 2.6*  AST 154* 79* 50*  ALT 252* 163* 133*  ALKPHOS 156* 154* 154*  BILITOT 6.2* 2.4* 2.1*   PT/INR Recent Labs    12/10/17 0407  LABPROT 14.1  INR 1.09   Hepatitis  Panel No results for input(s): HEPBSAG, HCVAB, HEPAIGM, HEPBIGM in the last 72 hours.  Studies/Results: No results found. Scheduled Meds: . amLODipine  10 mg Oral Daily  . clopidogrel  75 mg Oral Daily  . metoprolol succinate  50 mg Oral Daily  . traMADol  50 mg Oral Once   Continuous Infusions: . cefTRIAXone (ROCEPHIN)  IV 2 g (12/11/17 1400)   PRN Meds:.acetaminophen, fentaNYL (SUBLIMAZE) injection, hydrALAZINE, LORazepam, morphine injection, nitroGLYCERIN, ondansetron **OR** ondansetron (ZOFRAN) IV   ASSESSMENT:  Patient is clinically stabilizing from her episode of cholangitis.  We will plan to have her return for follow-up labs next week and in clinic visit in approximately 3 to 4 weeks so that we can ensure that her Plavix is stopped in a timely fashion for at least 5 days before repeat ERCP.  She should continue antibiotics as per medical service for treatment of bacteremia.  All patient questions were answered, to the best of my ability, and the patient agrees to the aforementioned plan of action with follow-up as indicated.   PLAN  Follow-up ERCP in 6 to 8 weeks Liver test to be arranged next week at my office Patient aware as his family of any signs or symptoms to let our office know about and/or to return to hospital   [facility],  MD [facility] Office # [phone]

## 2017-12-12 NOTE — Discharge Summary (Signed)
[name]:[id number] DOB: 04/25/1930 DOA: 12/08/2017  PCP: Lajean Manes, MD  Admit date: 12/08/2017  Discharge date: 12/12/2017  Admitted From: Home Disposition:  Home   Recommendations for Outpatient Follow-up:   Follow up with PCP in 1-2 weeks  PCP Please obtain BMP/CBC, 2 view CXR in 1week,  (see Discharge instructions)   PCP Please follow up on the following pending results:    Home Health: PT,RN   Equipment/Devices: Walker  Consultations: GI Discharge Condition: Fair   CODE STATUS: Full   Diet Recommendation: Heart Healthy     Chief Complaint  Patient presents with  . Abnormal Lab    AST/ALT      Brief history of present illness from the day of admission and additional interim summary    Audrey Cleaver Erwinis a 84 y.o.femalewithhistory of CAD status post CABG, hypertension presents to the ER at [facility] with complaints of abdominal pain nausea vomiting. Patient has been having these symptoms for last 2 days, CT scan suggested CBD stone and blockage.  She was admitted for further treatment.                                                                 Hospital Course   1.  Acute cholangitis due to CBD stone and obstruction.  She was treated with IV fluids and IV antibiotics which will be continued, seen by GI underwent ERCP on 12/10/2017 with large CBD dilation suggesting intraductal stone, she underwent stent placement but no stone removal or sphincterotomy was attempted as she was on Plavix, clinically much improved plan is to repeat ERCP in 5 to 6 weeks will defer to GI.  At this point she will be discharged home on 10 more days of Keflex.  She is symptom-free except for some generalized weakness and will be discharged home on Keflex along with PRN Zofran as needed.  2.  Mild non-STEMI due to  demand ischemia from stress from #1 above.  Currently chest pain-free, cardiology on board, echocardiogram shows a preserved EF of 55% although somewhat reduced since 2019 with no wall motion abnormalities, Plavix has been resumed she is at baseline.  Resumed her usual home regimen and requested to follow with primary cardiologist post discharge.  3.  Hypertension.   stble resume home regimen upon discharge.  4.  Thrombocytopenia.  Stable was most likely due to infection.  5.  Hypokalemia.  Replaced .   Discharge diagnosis     Principal Problem:   Acute cholangitis Active Problems:   Essential hypertension   PVC's (premature ventricular contractions)   S/P CABG x 3   Elevated troponin   Choledocholithiasis   Coronary artery disease involving native coronary artery of native heart without angina pectoris    Discharge instructions    Discharge Instructions  Diet - low sodium heart healthy   Complete by:  As directed    Increase activity slowly   Complete by:  As directed       Discharge Medications   Allergies as of 12/12/2017      Reactions   Sertraline Hcl Nausea Only   Biaxin [clarithromycin]    depressed   Celecoxib Other (See Comments)   Reaction: Unknown   Ciprofloxacin    Codeine Other (See Comments)   Reaction: Unknown   Hydromorphone    Lisinopril    cough   Macrobid [nitrofurantoin Monohyd Macro] Other (See Comments)   Reaction: Unknown   Oxycodone Hcl Other (See Comments)   Reaction: Unknown   Prednisone    Sertraline Hcl Other (See Comments)   Reaction: Unknown   Penicillins Rash   Reaction: Unknown      Medication List    TAKE these medications   acetaminophen 325 MG tablet Commonly known as:  TYLENOL Take 2 tablets (650 mg total) by mouth every 6 (six) hours as needed for mild pain or headache.   amLODipine 10 MG tablet Commonly known as:  NORVASC Take 1 tablet (10 mg total) by mouth daily. What changed:  how much to take     aspirin 81 MG tablet Take 81 mg by mouth daily.   atorvastatin 80 MG tablet Commonly known as:  LIPITOR Take 1 tablet (80 mg total) by mouth daily.   CALCIUM 600 + D PO Take 1 tablet by mouth daily. 600 mg/ 400 mg   cephALEXin 500 MG capsule Commonly known as:  KEFLEX Take 1 capsule (500 mg total) by mouth 4 (four) times daily for 10 days.   clopidogrel 75 MG tablet Commonly known as:  PLAVIX Take 1 tablet (75 mg total) by mouth daily.   estrogens (conjugated) 0.3 MG tablet Commonly known as:  PREMARIN Take 0.3 mg by mouth daily. Take daily for 21 days then do not take for 7 days.   furosemide 20 MG tablet Commonly known as:  LASIX Take 40 mg by mouth daily.   IRON PO Take 65 mg by mouth.   KLOR-CON M20 20 MEQ tablet Generic drug:  potassium chloride SA TAKE 1 TABLET (20 MEQ TOTAL) BY MOUTH 2 (TWO) TIMES DAILY.   LORazepam 0.5 MG tablet Commonly known as:  ATIVAN Take 1 tablet (0.5 mg total) by mouth every 8 (eight) hours as needed for anxiety.   losartan 100 MG tablet Commonly known as:  COZAAR Take 0.5 tablets (50 mg total) by mouth 2 (two) times daily.   metoprolol succinate 50 MG 24 hr tablet Commonly known as:  TOPROL-XL Take 50 mg by mouth daily.   multivitamin tablet Take 1 tablet by mouth daily.   nitroGLYCERIN 0.4 MG SL tablet Commonly known as:  NITROSTAT PLACE 1 TABLET (0.4 MG TOTAL) UNDER THE TONGUE EVERY 5 (FIVE) MINUTES AS NEEDED FOR CHEST PAIN.   ondansetron 4 MG tablet Commonly known as:  ZOFRAN Take 1 tablet (4 mg total) by mouth every 8 (eight) hours as needed for nausea or vomiting.            Durable Medical Equipment  (From admission, onward)         Start     Ordered   12/12/17 1145  For home use only DME Walker rolling  Once    Comments:  5 wheel  Question:  Patient needs a walker to treat with the following condition  Answer:  Weakness  12/12/17 1145          Follow-up Information    Stoneking, Hal, MD. Schedule  an appointment as soon as possible for a visit in 1 week(s).   Specialty:  Internal Medicine Why:  And with your primary cardiologist within a week. Contact information: [address] [place] [address] [phone]        Minna Merritts, MD .   Specialty:  Cardiology Contact information: [place] [address] [phone]        Mansouraty, Telford Nab., MD. Schedule an appointment as soon as possible for a visit in 1 week(s).   Specialties:  Gastroenterology, Internal Medicine Contact information: [place] [address] [phone]           Major procedures and Radiology Reports - PLEASE review detailed and final reports thoroughly  -     ERCP 12/10/17 by Dr Rush Landmark -large CBD stone noted with stent placement.  No sphincterotomy or stone removal due to recent Plavix use.  Plan is to repeat ERCP in a few weeks.  TTE - Compared to a prior study in 2018, the LVEF is now 55-60% withoutgross regional wall motion abnormalities. Moderate eccentric MR. There is diastolic dysfunction with elevated LV filling pressure and moderate pulmonary hypertension.  CT - 1. Obstructing 12 mm choledocholithiasis suspected in the distal CBD. Intra- and extrahepatic biliary ductal dilatation. 2. No other acute or inflammatory process identified in the abdomen or pelvis. 3. Distended urinary bladder with estimated volume of 566 mL. 4. Small chronic right pleural effusion. 5.  Aortic Atherosclerosis.   Dg Chest 2 View  Result Date: 12/08/2017 CLINICAL DATA:  Chest pain. Right upper quadrant pain. Nausea and vomiting. EXAM: CHEST - 2 VIEW COMPARISON:  07/03/2017 FINDINGS: Sequelae of prior CABG and right lower lobectomy are again identified with volume loss in the right hemithorax. The cardiac silhouette remains mildly enlarged. Aortic atherosclerosis is noted. No airspace consolidation, edema, or pneumothorax is identified.  There is chronic blunting of the right costophrenic angle which may reflect a small pleural effusion or pleural thickening, unchanged. No acute osseous abnormality is identified. IMPRESSION: Small chronic right pleural effusion or pleural thickening without evidence of acute airspace disease. Electronically Signed   By: Logan Bores M.D.   On: 12/08/2017 11:19   Ct Abdomen Pelvis W Contrast  Result Date: 12/08/2017 CLINICAL DATA:  84 year old female with nausea vomiting and right upper quadrant abdominal pain for 2 days. History of lung cancer. Dilated CBD and intrahepatic ducts on right upper quadrant ultrasound today. EXAM: CT ABDOMEN AND PELVIS WITH CONTRAST TECHNIQUE: Multidetector CT imaging of the abdomen and pelvis was performed using the standard protocol following bolus administration of intravenous contrast. CONTRAST:  164mL ISOVUE-300 IOPAMIDOL (ISOVUE-300) INJECTION 61% COMPARISON:  Right upper quadrant ultrasound 1150 hours today. CT Abdomen and Pelvis 12/21/2016. FINDINGS: Lower chest: Cardiomegaly and lung bases are stable since 2018, including a small layering right pleural effusion. Hepatobiliary: Intra- and extrahepatic biliary ductal dilatation. The CBD is up to 16 millimeters. In the distal CBD at the level of the pancreas there is a 12 millimeter rounded hyperdense filling defect with stippled fat density compatible with choledocholithiasis. See coronal image 36 and sagittal image 65. This is just proximal to the ampullary. Surgically absent gallbladder. Pancreas: Negative; no pancreatic inflammation or main pancreatic ductal dilatation. Spleen: Stable and negative; scattered small calcified granulomas. Adrenals/Urinary Tract: Normal adrenal glands. Bilateral renal enhancement and  contrast excretion is symmetric and within normal limits. Extrarenal pelvis, normal variant. Decompressed ureters. The bladder is distended but otherwise unremarkable. Estimated bladder volume is 566 milliliters.  Stomach/Bowel: Mild gaseous distension of the rectum. Decompressed and negative sigmoid colon, descending colon, redundant transverse colon. Negative right colon. The cecum is on a lax mesentery located anteriorly. The appendix is diminutive or absent. Negative terminal ileum. No dilated small bowel. Small gastric hiatal hernia suspected. Negative intraabdominal stomach. No abnormality of the duodenum. No free air, free fluid. Vascular/Lymphatic: Severe Aortoiliac calcified atherosclerosis. The major arterial structures in the abdomen and pelvis remain patent. Portal venous system is patent. No lymphadenopathy. Reproductive: Surgically absent. Other: No pelvic free fluid. Musculoskeletal: No acute osseous abnormality identified. IMPRESSION: 1. Obstructing 12 mm choledocholithiasis suspected in the distal CBD. Intra- and extrahepatic biliary ductal dilatation. 2. No other acute or inflammatory process identified in the abdomen or pelvis. 3. Distended urinary bladder with estimated volume of 566 mL. 4. Small chronic right pleural effusion. 5.  Aortic Atherosclerosis (ICD10-I70.0). Electronically Signed   By: Genevie Ann M.D.   On: 12/08/2017 12:36   Dg Ercp  Result Date: 12/10/2017 CLINICAL DATA:  Choledocholithiasis EXAM: ERCP TECHNIQUE: Multiple spot images obtained with the fluoroscopic device and submitted for interpretation post-procedure. COMPARISON:  CT 12/08/2017 FINDINGS: 2 fluoroscopic spot images document endoscopic cannulation and opacification of the CBD with passage of a guidewire. A large filling defect is noted in the distal CBD consistent with choledocholithiasis seen on prior CT. Cholecystectomy clips noted. There is limited opacification of the intrahepatic biliary tree. Final image documents placement of a plastic biliary stent in expected location. No evidence of extravasation. IMPRESSION: 1. Endoscopic CBD cannulation and intervention with stent placement. These images were submitted for  radiologic interpretation only. Please see the procedural report for the amount of contrast and the fluoroscopy time utilized. Electronically Signed   By: Lucrezia Europe M.D.   On: 12/10/2017 12:05   US Abdomen Limited Ruq  Result Date: 12/08/2017 CLINICAL DATA:  84 year old female with elevated LFTs. EXAM: ULTRASOUND ABDOMEN LIMITED RIGHT UPPER QUADRANT COMPARISON:  CT Abdomen and Pelvis today reported separately. CT Abdomen and Pelvis 12/21/2016. FINDINGS: Gallbladder: Surgically absent. Common bile duct: Diameter: Up to 17 millimeters (dilated) on image 5. However, the CBD was 14 millimeters in 2018. No obstructing lesion identified within the visible portion of the duct. Liver: Intrahepatic ductal dilatation (image 24). Liver echogenicity within normal limits. No discrete liver lesion. Portal vein is patent on color Doppler imaging with normal direction of blood flow towards the liver. Other findings: A small right pleural effusion is noted on image 42. Negative visible right kidney. IMPRESSION: 1. Dilated CBD and intrahepatic biliary ductal dilatation, similar to but greater than that by CT in 2018 which appeared due to choledocholithiasis. 2. Small right pleural effusion. Electronically Signed   By: Genevie Ann M.D.   On: 12/08/2017 12:17    Micro Results     Recent Results (from the past 240 hour(s))  Blood culture (routine x 2)     Status: None (Preliminary result)   Collection Time: 12/08/17  1:12 PM  Result Value Ref Range Status   Specimen Description BLOOD LEFT HAND  Final   Special Requests   Final    BOTTLES DRAWN AEROBIC AND ANAEROBIC Blood Culture adequate volume   Culture   Final    NO GROWTH 4 DAYS Performed at [facility], [address] [place], [place] [address]    Report Status  PENDING  Incomplete  Blood culture (routine x 2)     Status: Abnormal   Collection Time: 12/08/17  1:12 PM  Result Value Ref Range Status   Specimen Description   Final    BLOOD RIGHT  AC Performed at [facility], [address], [place], [place] [address]    Special Requests   Final    BOTTLES DRAWN AEROBIC AND ANAEROBIC Blood Culture adequate volume Performed at [facility], [address] [place], [place] [address]    Culture  Setup Time   Final    Organism ID to follow GRAM NEGATIVE RODS IN BOTH AEROBIC AND ANAEROBIC BOTTLES CRITICAL RESULT CALLED TO, READ BACK BY AND VERIFIED WITH: CASSEY BAILEY AT 9233 12/09/17 [place] Performed at [facility], [address] [place], [place] [address]    Culture KLEBSIELLA OXYTOCA (A)  Final   Report Status 12/11/2017 FINAL  Final  Blood Culture ID Panel (Reflexed)     Status: Abnormal   Collection Time: 12/08/17  1:12 PM  Result Value Ref Range Status   Enterococcus species NOT DETECTED NOT DETECTED Final   Listeria monocytogenes NOT DETECTED NOT DETECTED Final   Staphylococcus species NOT DETECTED NOT DETECTED Final   Staphylococcus aureus (BCID) NOT DETECTED NOT DETECTED Final   Streptococcus species NOT DETECTED NOT DETECTED Final   Streptococcus agalactiae NOT DETECTED NOT DETECTED Final   Streptococcus pneumoniae NOT DETECTED NOT DETECTED Final   Streptococcus pyogenes NOT DETECTED NOT DETECTED Final   Acinetobacter baumannii NOT DETECTED NOT DETECTED Final   Enterobacteriaceae species DETECTED (A) NOT DETECTED Final    Comment: Enterobacteriaceae represent a large family of gram-negative bacteria, not a single organism. CRITICAL RESULT CALLED TO, READ BACK BY AND VERIFIED WITH:  CASSEY BAILEY AT 2633 12/09/17 SDR    Enterobacter cloacae complex NOT DETECTED NOT DETECTED Final   Escherichia coli NOT DETECTED NOT DETECTED Final   Klebsiella oxytoca DETECTED (A) NOT DETECTED Final    Comment: CRITICAL RESULT CALLED TO, READ BACK BY AND VERIFIED WITH:  CASSEY BAILEY AT 3545 12/09/17 SDR    Klebsiella pneumoniae NOT DETECTED NOT DETECTED Final   Proteus species NOT DETECTED NOT  DETECTED Final   Serratia marcescens NOT DETECTED NOT DETECTED Final   Carbapenem resistance NOT DETECTED NOT DETECTED Final   Haemophilus influenzae NOT DETECTED NOT DETECTED Final   Neisseria meningitidis NOT DETECTED NOT DETECTED Final   Pseudomonas aeruginosa NOT DETECTED NOT DETECTED Final   Candida albicans NOT DETECTED NOT DETECTED Final   Candida glabrata NOT DETECTED NOT DETECTED Final   Candida krusei NOT DETECTED NOT DETECTED Final   Candida parapsilosis NOT DETECTED NOT DETECTED Final   Candida tropicalis NOT DETECTED NOT DETECTED Final    Comment: Performed at [facility], [address], [place], [place] [address]    Today   Subjective    Zamia Tyminski today has no headache,no chest abdominal pain,no new weakness tingling or numbness, feels much better wants to go home today.      Objective   Blood pressure (!) 161/75, pulse 81, temperature 99.8 F (37.7 C), temperature source Oral, resp. rate 18, height 5\' 6"  (1.676 m), weight 83.3 kg, SpO2 95 %.  No intake or output data in the 24 hours ending 12/12/17 1149  Exam Awake Alert, Oriented x 3, No new F.N deficits, Normal affect Inger.AT,PERRAL Supple Neck,No JVD, No cervical lymphadenopathy appriciated.  Symmetrical Chest wall movement, Good air movement bilaterally, CTAB RRR,No Gallops,Rubs or new  Murmurs, No Parasternal Heave +ve B.Sounds, Abd Soft, Non tender, No organomegaly appriciated, No rebound -guarding or rigidity. No Cyanosis, Clubbing or edema, No new Rash or bruise   Data Review   CBC w Diff:  Lab Results  Component Value Date   WBC 7.6 12/12/2017   HGB 11.8 (L) 12/12/2017   HGB 14.6 11/03/2013   HCT 36.8 12/12/2017   HCT 43.6 11/03/2013   PLT 151 12/12/2017   PLT 182 11/03/2013   LYMPHOPCT 9 12/08/2017   MONOPCT 3 12/08/2017   EOSPCT 0 12/08/2017   BASOPCT 0 12/08/2017    CMP:  Lab Results  Component Value Date   NA 139 12/12/2017   NA 140 11/03/2013   K 3.3 (L) 12/12/2017    K 3.7 11/03/2013   CL 101 12/12/2017   CL 105 11/03/2013   CO2 25 12/12/2017   CO2 29 11/03/2013   BUN 11 12/12/2017   BUN 12 11/03/2013   CREATININE 0.78 12/12/2017   CREATININE 0.73 11/03/2013   PROT 6.3 (L) 12/12/2017   PROT 7.2 08/21/2012   ALBUMIN 2.6 (L) 12/12/2017   ALBUMIN 3.6 08/21/2012   BILITOT 2.1 (H) 12/12/2017   BILITOT 1.4 (H) 08/21/2012   ALKPHOS 154 (H) 12/12/2017   ALKPHOS 117 08/21/2012   AST 50 (H) 12/12/2017   AST 36 08/21/2012   ALT 133 (H) 12/12/2017   ALT 30 08/21/2012  .   Total Time in preparing paper work, data evaluation and todays exam - 10 minutes  Lala Lund M.D on 12/12/2017 at 11:49 AM  [facility]   Office  [phone]

## 2017-12-12 NOTE — Discharge Instructions (Signed)
Follow with Primary MD Lajean Manes, MD in 7 days   Get CBC, CMP checked  by Primary MD in 5-7 days    Activity: As tolerated with Full fall precautions use walker/cane & assistance as needed  Disposition Home    Diet: Heart Healthy     Special Instructions: If you have smoked or chewed Tobacco  in the last 2 yrs please stop smoking, stop any regular Alcohol  and or any Recreational drug use.  On your next visit with your primary care physician please Get Medicines reviewed and adjusted.  Please request your Prim.MD to go over all Hospital Tests and Procedure/Radiological results at the follow up, please get all Hospital records sent to your Prim MD by signing hospital release before you go home.  If you experience worsening of your admission symptoms, develop shortness of breath, life threatening emergency, suicidal or homicidal thoughts you must seek medical attention immediately by calling 911 or calling your MD immediately  if symptoms less severe.  You Must read complete instructions/literature along with all the possible adverse reactions/side effects for all the Medicines you take and that have been prescribed to you. Take any new Medicines after you have completely understood and accpet all the possible adverse reactions/side effects.

## 2017-12-12 NOTE — Progress Notes (Signed)
Pt & son given discharge instructions, prescriptions, and care notes. Pt verbalized understanding AEB no further questions or concerns at this time. IV was discontinued, no redness, pain, or swelling noted at this time. Pt left the floor via wheelchair with staff in stable condition.   Rolling walker to be delivered to room. However son did not want to wait any longer and said they would leave.

## 2017-12-13 ENCOUNTER — Telehealth: Payer: Self-pay

## 2017-12-13 DIAGNOSIS — K8309 Other cholangitis: Secondary | ICD-10-CM

## 2017-12-13 LAB — CULTURE, BLOOD (ROUTINE X 2)
Culture: NO GROWTH
Special Requests: ADEQUATE
Special Requests: ADEQUATE

## 2017-12-13 NOTE — Telephone Encounter (Signed)
-----   Message from Irving Copas., MD sent at 12/10/2017 12:47 PM EST ----- Tanveer Dobberstein, Please schedule this patient for a follow up ERCP in 6-8 weeks. We will need to arrange holding her Plavix for 5-days prior. She will need a big sphincterotomy and sphincteroplasty. Thanks. Chester Holstein

## 2017-12-13 NOTE — Telephone Encounter (Signed)
Mansouraty, Telford Nab., MD  Timothy Lasso, RN; Vena Rua, PA-C        [name],  Please have patient come in next week for a hepatic function panel.  Please arrange a clinic visit in approximately 3 weeks to 4 weeks so that we can ensure we get cardiology approval for Plavix to be on hold for 5 days before repeat ERCP.  Please look for an ERCP time approximately 6 to 8 weeks for patient.  Thanks.  Chester Holstein

## 2017-12-13 NOTE — Telephone Encounter (Signed)
Left message on machine to call back  

## 2017-12-13 NOTE — Anesthesia Postprocedure Evaluation (Signed)
Anesthesia Post Note  Patient: [name]  Procedure(s) Performed: ENDOSCOPIC RETROGRADE CHOLANGIOPANCREATOGRAPHY (ERCP) (N/A ) BILIARY STENT PLACEMENT     Patient location during evaluation: PACU Anesthesia Type: General Level of consciousness: awake and alert Pain management: pain level controlled Vital Signs Assessment: post-procedure vital signs reviewed and stable Respiratory status: spontaneous breathing, nonlabored ventilation and respiratory function stable Cardiovascular status: blood pressure returned to baseline and stable Postop Assessment: no apparent nausea or vomiting Anesthetic complications: no    Last Vitals:  Vitals:   12/12/17 0434 12/12/17 0626  BP: (!) 170/70 (!) 161/75  Pulse: 81   Resp:    Temp:    SpO2:      Last Pain:  Vitals:   12/12/17 0850  TempSrc:   PainSc: 0-No pain                 Audry Pili

## 2017-12-16 NOTE — Telephone Encounter (Signed)
The patient has been notified of this information and all questions answered.   The pt will have labs tomorrow and f/u on 12/21/17 930 am with Dr Rush Landmark.  Plavix letter sent to Ellwood Handler PA

## 2017-12-17 ENCOUNTER — Other Ambulatory Visit (INDEPENDENT_AMBULATORY_CARE_PROVIDER_SITE_OTHER): Payer: Medicare Other

## 2017-12-17 DIAGNOSIS — K8309 Other cholangitis: Secondary | ICD-10-CM | POA: Diagnosis not present

## 2017-12-17 LAB — HEPATIC FUNCTION PANEL
ALT: 52 U/L — ABNORMAL HIGH (ref 0–35)
AST: 38 U/L — ABNORMAL HIGH (ref 0–37)
Albumin: 3.9 g/dL (ref 3.5–5.2)
Alkaline Phosphatase: 188 U/L — ABNORMAL HIGH (ref 39–117)
BILIRUBIN DIRECT: 0.6 mg/dL — AB (ref 0.0–0.3)
Total Bilirubin: 1.7 mg/dL — ABNORMAL HIGH (ref 0.2–1.2)
Total Protein: 7.5 g/dL (ref 6.0–8.3)

## 2017-12-20 ENCOUNTER — Telehealth: Payer: Self-pay | Admitting: Gastroenterology

## 2017-12-20 DIAGNOSIS — K8309 Other cholangitis: Secondary | ICD-10-CM

## 2017-12-20 NOTE — Telephone Encounter (Signed)
The pt was advised appt is ok to keep and she was notified to come in for KUB prior to next appt.  She will come in at her earliest convenience.

## 2017-12-21 ENCOUNTER — Ambulatory Visit: Payer: Self-pay | Admitting: Gastroenterology

## 2017-12-22 ENCOUNTER — Other Ambulatory Visit: Payer: Self-pay | Admitting: Geriatric Medicine

## 2017-12-22 ENCOUNTER — Ambulatory Visit
Admission: RE | Admit: 2017-12-22 | Discharge: 2017-12-22 | Disposition: A | Discharge status: Home / Self Care | Payer: Medicare Other | Source: Ambulatory Visit | Attending: Geriatric Medicine | Admitting: Geriatric Medicine

## 2017-12-22 DIAGNOSIS — K8309 Other cholangitis: Secondary | ICD-10-CM | POA: Diagnosis not present

## 2017-12-22 DIAGNOSIS — I10 Essential (primary) hypertension: Secondary | ICD-10-CM | POA: Diagnosis not present

## 2017-12-22 DIAGNOSIS — J90 Pleural effusion, not elsewhere classified: Secondary | ICD-10-CM

## 2017-12-22 DIAGNOSIS — I700 Atherosclerosis of aorta: Secondary | ICD-10-CM | POA: Diagnosis not present

## 2017-12-22 DIAGNOSIS — Z79899 Other long term (current) drug therapy: Secondary | ICD-10-CM | POA: Diagnosis not present

## 2017-12-25 NOTE — Progress Notes (Signed)
Acceptable risk for procedure Would stop plavix 5 days prior to procedure Stay on asa daily Restart plavix after procedure

## 2017-12-27 ENCOUNTER — Telehealth: Payer: Self-pay

## 2017-12-27 ENCOUNTER — Telehealth: Payer: Self-pay | Admitting: Gastroenterology

## 2017-12-27 NOTE — Telephone Encounter (Signed)
-----   Message from Emily Filbert, RN sent at 12/27/2017  8:57 AM EST -----  ----- Message ----- From: Minna Merritts, MD Sent: 12/25/2017   4:40 PM EST To: [facility] Triage  Acceptable risk for procedure Would stop plavix 5 days prior to procedure Stay on asa daily Restart plavix after procedure Thx TG ----- Message ----- From: Timothy Lasso, RN Sent: 12/16/2017  12:43 PM EST To: Minna Merritts, MD

## 2017-12-27 NOTE — Telephone Encounter (Signed)
The pt was advised to come in for x ray as recommended.

## 2017-12-28 ENCOUNTER — Ambulatory Visit (INDEPENDENT_AMBULATORY_CARE_PROVIDER_SITE_OTHER)
Admission: RE | Admit: 2017-12-28 | Discharge: 2017-12-28 | Disposition: A | Discharge status: Home / Self Care | Payer: Medicare Other | Source: Ambulatory Visit | Attending: Gastroenterology | Admitting: Gastroenterology

## 2017-12-28 DIAGNOSIS — K8033 Calculus of bile duct with acute cholangitis with obstruction: Secondary | ICD-10-CM | POA: Diagnosis not present

## 2017-12-28 DIAGNOSIS — K8309 Other cholangitis: Secondary | ICD-10-CM

## 2018-01-03 ENCOUNTER — Encounter: Payer: Self-pay | Admitting: Thoracic Surgery (Cardiothoracic Vascular Surgery)

## 2018-01-10 ENCOUNTER — Encounter: Payer: Self-pay | Admitting: Thoracic Surgery (Cardiothoracic Vascular Surgery)

## 2018-01-10 DIAGNOSIS — H353212 Exudative age-related macular degeneration, right eye, with inactive choroidal neovascularization: Secondary | ICD-10-CM | POA: Diagnosis not present

## 2018-01-10 DIAGNOSIS — H353122 Nonexudative age-related macular degeneration, left eye, intermediate dry stage: Secondary | ICD-10-CM | POA: Diagnosis not present

## 2018-01-17 ENCOUNTER — Encounter: Payer: Self-pay | Admitting: Thoracic Surgery (Cardiothoracic Vascular Surgery)

## 2018-01-19 ENCOUNTER — Other Ambulatory Visit: Payer: Self-pay | Admitting: Geriatric Medicine

## 2018-01-19 ENCOUNTER — Ambulatory Visit
Admission: RE | Admit: 2018-01-19 | Discharge: 2018-01-19 | Disposition: A | Discharge status: Home / Self Care | Payer: Medicare Other | Source: Ambulatory Visit | Attending: Geriatric Medicine | Admitting: Geriatric Medicine

## 2018-01-19 ENCOUNTER — Other Ambulatory Visit (INDEPENDENT_AMBULATORY_CARE_PROVIDER_SITE_OTHER): Payer: Medicare Other

## 2018-01-19 ENCOUNTER — Telehealth: Payer: Self-pay

## 2018-01-19 ENCOUNTER — Encounter: Payer: Self-pay | Admitting: Gastroenterology

## 2018-01-19 ENCOUNTER — Ambulatory Visit (INDEPENDENT_AMBULATORY_CARE_PROVIDER_SITE_OTHER): Payer: Medicare Other | Admitting: Gastroenterology

## 2018-01-19 VITALS — BP 138/70 | HR 68 | Ht 65.75 in | Wt 177.0 lb

## 2018-01-19 DIAGNOSIS — Z9889 Other specified postprocedural states: Secondary | ICD-10-CM | POA: Diagnosis not present

## 2018-01-19 DIAGNOSIS — K8050 Calculus of bile duct without cholangitis or cholecystitis without obstruction: Secondary | ICD-10-CM | POA: Diagnosis not present

## 2018-01-19 DIAGNOSIS — J90 Pleural effusion, not elsewhere classified: Secondary | ICD-10-CM | POA: Diagnosis not present

## 2018-01-19 LAB — HEPATIC FUNCTION PANEL
ALT: 19 U/L (ref 0–35)
AST: 28 U/L (ref 0–37)
Albumin: 4 g/dL (ref 3.5–5.2)
Alkaline Phosphatase: 105 U/L (ref 39–117)
Bilirubin, Direct: 0.3 mg/dL (ref 0.0–0.3)
Total Bilirubin: 1.3 mg/dL — ABNORMAL HIGH (ref 0.2–1.2)
Total Protein: 7 g/dL (ref 6.0–8.3)

## 2018-01-19 NOTE — Telephone Encounter (Signed)
[facility] Pre-operative Risk Assessment     Request for surgical clearance:     Endoscopy Procedure  What type of surgery is being performed?     ERCP  When is this surgery scheduled?     02/21/18  What type of clearance is required ?   Pharmacy  Are there any medications that need to be held prior to surgery and how long? Plavix  Practice name and name of physician performing surgery?      [facility]  What is your office phone and fax number?      Phone- [phone]  [phone]  Anesthesia type (None, local, MAC, general) ?       MAC

## 2018-01-19 NOTE — Patient Instructions (Signed)
You have been scheduled for an ERCP at [facility]. Please follow written instructions given to you at your visit today.   Your provider has requested that you go to the basement level for lab work before leaving today. Press "B" on the elevator. The lab is located at the first door on the left as you exit the elevator. Thank you for entrusting me with your care and choosing [facility] care.  Dr Rush Landmark

## 2018-01-20 ENCOUNTER — Encounter: Payer: Self-pay | Admitting: Gastroenterology

## 2018-01-20 NOTE — Telephone Encounter (Signed)
This is a pt of Dr. Rockey Situ and should be handled in the [place] office not in the preop pool in [place].   I will route to [place] office.

## 2018-01-20 NOTE — Telephone Encounter (Signed)
Patient with a history of PCI- 2001 CABG x 3 - 12/23/16  To Dr. Rockey Situ to review regarding plavix.

## 2018-01-20 NOTE — Progress Notes (Signed)
[facility] VISIT   Primary Care Provider Lajean Manes, MD [address] [place] [address] [phone]  Referring Provider Lajean Manes, MD [address] [place], [place] [address] [phone]  Patient Profile: [name] is a 85 y.o. female with a pmh significant for HTN, HLD, Macular Degeneration, CAD (s/p CABG - still on Plavix), hx of TIA, Carotid Artery Disease, Anxiety, s/p CCK, Choledocholithiasis (s/p ERCP with stenting).  The patient presents to the [facility] for an evaluation and management of problem(s) noted below:  Problem List 1. History of ERCP   2. Choledocholithiasis   3. History of biliary stent insertion     History of Present Illness: This is the patient's first visit to the outpatient [facility] clinic.  I met this patient in the hospital in the setting of a presentation of abnormal LFTs with fevers.  She had still been taking her Plavix.  ERCP was performed and showed evidence of choledocholithias and cholangitis.  Biliary stenting was performed.  She was discharged.  LFTs had improved and followed up as an outpatient.  She returns for follow up to discuss next steps in evaluation and follow up.  She is accompanied by her adult grandson.  She has no complaints currently.  She is feeling well and getting stronger daily.  The patient denies any issues with jaundice, scleral icterus, pruritus, darkened/amber urine, clay-colored stools, LE edema, hematemesis, coffee-ground emesis, abdominal distention, confusion, new generalized pruritus.    GI Review of Systems Positive as above Negative for dysphagia, odynophagia, nausea, vomiting, abdominal pain, jaundice, change in bowel habits  Review of Systems General: Denies fevers/chills/weight loss HEENT: Denies oral lesions Cardiovascular: Denies chest pain Pulmonary: Denies shortness of breath Gastroenterological: See  HPI Genitourinary: Denies darkened urine Hematological: Positive for history of easy bruising/bleeding due to Plavix Dermatological: Denies jaundice Psychological: Mood is stable   Medications Current Outpatient Medications  Medication Sig Dispense Refill  . acetaminophen (TYLENOL) 325 MG tablet Take 2 tablets (650 mg total) by mouth every 6 (six) hours as needed for mild pain or headache.    Marland Kitchen amLODipine (NORVASC) 10 MG tablet Take 1 tablet (10 mg total) by mouth daily. (Patient taking differently: Take 5 mg by mouth daily. )    . aspirin 81 MG tablet Take 81 mg by mouth daily.    Marland Kitchen atorvastatin (LIPITOR) 80 MG tablet Take 1 tablet (80 mg total) by mouth daily.    . Calcium Carbonate-Vitamin D (CALCIUM 600 + D PO) Take 1 tablet by mouth daily. 600 mg/ 400 mg    . clopidogrel (PLAVIX) 75 MG tablet Take 1 tablet (75 mg total) by mouth daily.    Marland Kitchen estrogens, conjugated, (PREMARIN) 0.3 MG tablet Take 0.3 mg by mouth daily. Take daily for 21 days then do not take for 7 days.    . furosemide (LASIX) 20 MG tablet Take 40 mg by mouth daily.     . IRON PO Take 65 mg by mouth.    Marland Kitchen KLOR-CON M20 20 MEQ tablet TAKE 1 TABLET (20 MEQ TOTAL) BY MOUTH 2 (TWO) TIMES DAILY. 180 tablet 3  . LORazepam (ATIVAN) 0.5 MG tablet Take 1 tablet (0.5 mg total) by mouth every 8 (eight) hours as needed for anxiety. 30 tablet 0  . losartan (COZAAR) 100 MG tablet Take 0.5 tablets (50 mg total) by mouth 2 (two) times daily. 90 tablet 3  . metoprolol succinate (TOPROL-XL) 50 MG 24 hr  tablet Take 50 mg by mouth daily.   3  . Multiple Vitamin (MULTIVITAMIN) tablet Take 1 tablet by mouth daily.    . nitroGLYCERIN (NITROSTAT) 0.4 MG SL tablet PLACE 1 TABLET (0.4 MG TOTAL) UNDER THE TONGUE EVERY 5 (FIVE) MINUTES AS NEEDED FOR CHEST PAIN. 25 tablet 0  . ondansetron (ZOFRAN) 4 MG tablet Take 1 tablet (4 mg total) by mouth every 8 (eight) hours as needed for nausea or vomiting. 20 tablet 0   No current facility-administered  medications for this visit.     Allergies Allergies  Allergen Reactions  . Sertraline Hcl Nausea Only  . Biaxin [Clarithromycin]     depressed  . Celecoxib Other (See Comments)    Reaction: Unknown  . Ciprofloxacin   . Codeine Other (See Comments)    Reaction: Unknown  . Hydromorphone   . Lisinopril     cough  . Macrobid [employer] Macro] Other (See Comments)    Reaction: Unknown  . Oxycodone Hcl Other (See Comments)    Reaction: Unknown  . Prednisone   . Sertraline Hcl Other (See Comments)    Reaction: Unknown  . Penicillins Rash    Reaction: Unknown    Histories Past Medical History:  Diagnosis Date  . Anxiety   . Atypical carcinoid lung tumor ([place])   . CAD (coronary artery disease)    a. 1986: s/p PTCA of bifurcation lesion in LAD;  b. 2001: s/p BMS to LAD & LCX;  c. 04/2006 Cath: diff CAD w/o critical narrowing, nl EF;  d. 12/2014 MV: inflat ST dep, mild basal inf ischemia, EF 55-65%-->Low risk; e. 12/2016 Cath: LM 90d, LAD 90ost, 50p, 74m LCX 80ost, 222mRCA 90p, 7067m0-CTO w/ L->R collats filling RPDA.  . Cancer ([facility] . Carotid arterial disease ([facility]  a. 08/2015 Carotid U/S: < 39% bilat ICA stenosis.  . Carotid disease, bilateral ([facility]  a. RICA 0-34-09%ICA 40-81-19% Cataracts, bilateral   . Cholelithiases   . Chronic diarrhea   . Depression   . Gilbert's syndrome   . History of echocardiogram    a. 06/2010 Echo: EF 60-65%, mild AI/MR; b. 10/2015 Echo: EF 60-65%, no rwma, mild MR, nl RV fxn.  . HMarland KitchenN (hypertension)   . Hyperlipidemia   . Impaired fasting blood sugar   . Keloid    mid sternum  . Macular degeneration   . Osteoarthritis   . Positional vertigo    benign  . S/P CABG x 3 12/23/2016   LIMA to LAD, SVG to OM, SVG to PDA, EVH via left thigh  . Symptoms, such as flushing, sleeplessness, headache, lack of concentration, associated with the menopause   . TIA (transient ischemic attack)    Past Surgical History:  Procedure  Laterality Date  . ABDOMINAL AORTOGRAM N/A 12/22/2016   Procedure: ABDOMINAL AORTOGRAM;  Surgeon: EndNelva BushD;  [facility];  Service: Cardiovascular;  Laterality: N/A;  . ABDOMINAL HYSTERECTOMY    . BILIARY STENT PLACEMENT  12/10/2017   Procedure: BILIARY STENT PLACEMENT;  Surgeon: ManIrving CopasMD;  [facility];;  . CHOLECYSTECTOMY    . CORONARY ARTERY BYPASS GRAFT N/A 12/23/2016   Procedure: CORONARY ARTERY BYPASS GRAFTING (CABG) TIMES THREE USING LEFT INTERNAL MAMMARY ARTERY AND LEFT SAPHENOUS LEG VEIN HARVESTED ENDOSCOPICALLY,;  Surgeon: OweRexene AlbertsD;  [facility];  Laterality: N/A;  . ERCP N/A 12/10/2017  Procedure: ENDOSCOPIC RETROGRADE CHOLANGIOPANCREATOGRAPHY (ERCP);  Surgeon: Irving Copas., MD;  Location: [facility];  Service: Gastroenterology;  Laterality: N/A;  . IABP INSERTION N/A 12/22/2016   Procedure: IABP Insertion;  Surgeon: Nelva Bush, MD;  Location: [facility];  Service: Cardiovascular;  Laterality: N/A;  . LEFT HEART CATH AND CORONARY ANGIOGRAPHY N/A 12/22/2016   Procedure: LEFT HEART CATH AND CORONARY ANGIOGRAPHY;  Surgeon: Nelva Bush, MD;  Location: [facility];  Service: Cardiovascular;  Laterality: N/A;  . LEFT HEART CATHETERIZATION WITH CORONARY ANGIOGRAM N/A 04/27/2012   Procedure: LEFT HEART CATHETERIZATION WITH CORONARY ANGIOGRAM;  Surgeon: Peter M Martinique, MD;  Location[facility] C[facility] LAB;  Service: Cardiovascular;  Laterality: N/A;  . LOBECTOMY Right    RLL atypical carcinoid tumor - Dr Arlyce Dice  . pci  1986  . PCI of LAD  2001   with non des lad   . pci of mid cfx     with non des and cutting balloon pci of av circumflex  . TEE WITHOUT CARDIOVERSION N/A 12/23/2016   Procedure: TRANSESOPHAGEAL ECHOCARDIOGRAM (TEE);  Surgeon: Rexene Alberts, MD;  Location: [employer];  Service: Open Heart Surgery;  Laterality:  N/A;   Social History   Socioeconomic History  . Marital status: Divorced    Spouse name: Not on file  . Number of children: Not on file  . Years of education: Not on file  . Highest education level: Not on file  Occupational History  . Occupation: retired  Scientific laboratory technician  . Financial resource strain: Not on file  . Food insecurity:    Worry: Not on file    Inability: Not on file  . Transportation needs:    Medical: Not on file    Non-medical: Not on file  Tobacco Use  . Smoking status: Never Smoker  . Smokeless tobacco: Never Used  Substance and Sexual Activity  . Alcohol use: No  . Drug use: No  . Sexual activity: Not on file  Lifestyle  . Physical activity:    Days per week: Not on file    Minutes per session: Not on file  . Stress: Not on file  Relationships  . Social connections:    Talks on phone: Not on file    Gets together: Not on file    Attends religious service: Not on file    Active member of club or organization: Not on file    Attends meetings of clubs or organizations: Not on file    Relationship status: Not on file  . Intimate partner violence:    Fear of current or ex partner: Not on file    Emotionally abused: Not on file    Physically abused: Not on file    Forced sexual activity: Not on file  Other Topics Concern  . Not on file  Social History Narrative  . Not on file   Family History  Problem Relation Age of Onset  . Diabetes Mellitus I Mother   . CAD Mother   . CAD Father   . Diabetes Mellitus I Sister   . Diabetes Mellitus I Brother   . Diabetes Mellitus I Sister   . Colon cancer Neg Hx   . Esophageal cancer Neg Hx   . Inflammatory bowel disease Neg Hx   . Liver disease Neg Hx   . Pancreatic cancer Neg Hx   . Rectal cancer Neg Hx   . Stomach cancer Neg Hx    I have reviewed her  medical, social, and family history in detail and updated the electronic medical record as necessary.    PHYSICAL EXAMINATION  BP 138/70 (BP Location:  Left Arm, Patient Position: Sitting, Cuff Size: Normal)   Pulse 68 Comment: irregular  Ht 5' 5.75" (1.67 m) Comment: height measured without shoes  Wt 177 lb (80.3 kg)   BMI 28.79 kg/m  Wt Readings from Last 3 Encounters:  01/19/18 177 lb (80.3 kg)  12/11/17 183 lb 10.3 oz (83.3 kg)  12/08/17 165 lb (74.8 kg)  GEN: NAD, appears stated age, doesn't appear chronically ill PSYCH: Cooperative, without pressured speech EYE: Conjunctivae pink, sclerae anicteric ENT: MMM, without oral ulcers, no erythema or exudates noted NECK: Supple CV: RR without R/Gs  RESP: CTAB posteriorly, without wheezing GI: NABS, soft, NT/ND, without rebound or guarding, no HSM appreciated MSK/EXT: Trace BLE edema SKIN: No jaundice NEURO:  Alert & Oriented x 3, no focal deficits   REVIEW OF DATA  I reviewed the following data at the time of this encounter:  GI Procedures and Studies  November 2019 ERCP - The major papilla appeared to be flat. Pus was seen emerging from the major papilla. - A filling defect consistent with a stone was seen on the cholangiogram. - The entire main bile duct was severely dilated due to the stone. - The examination was suspicious for choledocholithiasis. Removal was not attempted due to Plavix only being stopped less than 48 hours before and in setting of cholangitis, a stent was inserted and traversed the large stone into the proximal CBD.  Laboratory Studies  Reviewed in epic and from outside records as noted in telephone/documentation chart  Imaging Studies  No new relevant studies to review   ASSESSMENT  Ms. Ancona is a 85 y.o. female  with a pmh significant for HTN, HLD, Macular Degeneration, CAD (s/p CABG - still on Plavix), hx of TIA, Carotid Artery Disease, Anxiety, s/p CCK, Choledocholithiasis (s/p ERCP with stenting).  The patient is seen today for evaluation and management of:  1. History of ERCP   2. Choledocholithiasis   3. History of biliary stent insertion     The patient has been hemodynamically and clinically stable.  She has done well status post her ERCP with stenting in the setting of cholangitis and choledocholithiasis.  We had a long discussion about what we did while she was in the hospital and went over imaging of our ERCP as well as imaging from her CT scan.  I would like to see what her liver tests are currently.  I would like to proceed with scheduling an ERCP in February or March ideally when we can have the patient off of Plavix for 5 days so that a sphincterotomy and a sphincteroplasty can be performed.  The risks of an ERCP were discussed at length, including but not limited to the risk of perforation, bleeding, abdominal pain, post-ERCP pancreatitis (while usually mild can be severe and even life threatening).  She is willing to proceed with a repeat ERCP.  I spoke with the patient and family and discussed that multiple ERCPs may be required for completion of clearing the duct but we would do everything we can to try and minimize that as much as possible.  All patient questions were answered, to the best of my ability, and the patient agrees to the aforementioned plan of action with follow-up as indicated.   PLAN  Laboratories as outlined below ERCP to be scheduled (will need to get cardiology to give  okay for Plavix to be held 5 days before procedure and up to 2 days after insetting of likely sphincterotomy and sphincteroplasty) Patient and family aware that multiple ERCP sessions may be required to clear the duct completely   Orders Placed This Encounter  Procedures  . Hepatic function panel  . Ambulatory referral to Gastroenterology    New Prescriptions   No medications on file   Modified Medications   No medications on file    Planned Follow Up: No follow-ups on file.   Justice Britain, MD [facility] Office # [phone]

## 2018-01-23 NOTE — Telephone Encounter (Signed)
Acceptable risk for procedure Would stop plavix 5 days prior to procedure Stay on asa daily Restart plavix after procedure

## 2018-01-24 NOTE — Telephone Encounter (Signed)
Spoke to patient to patient, Informed her that Dr Rockey Situ has given her clearance to stop Plavix 5 days prior to 02/21/18 procedure. She will stay on daily asa. Patient voiced understanding.

## 2018-01-24 NOTE — Telephone Encounter (Signed)
Copy of phone encounter faxed to [facility] at [phone] with Dr. Donivan Scull recommendations.   Confirmation received.

## 2018-01-25 DIAGNOSIS — Z9889 Other specified postprocedural states: Secondary | ICD-10-CM | POA: Insufficient documentation

## 2018-01-27 ENCOUNTER — Encounter: Payer: Self-pay | Admitting: Thoracic Surgery (Cardiothoracic Vascular Surgery)

## 2018-02-03 ENCOUNTER — Encounter (HOSPITAL_COMMUNITY): Payer: Self-pay | Admitting: Physician Assistant

## 2018-02-03 ENCOUNTER — Observation Stay (HOSPITAL_COMMUNITY)
Admission: EM | Admit: 2018-02-03 | Discharge: 2018-02-04 | Disposition: A | Discharge status: Home / Self Care | Payer: Medicare Other | Attending: Internal Medicine | Admitting: Internal Medicine

## 2018-02-03 ENCOUNTER — Emergency Department (HOSPITAL_COMMUNITY): Payer: Medicare Other

## 2018-02-03 ENCOUNTER — Other Ambulatory Visit: Payer: Self-pay

## 2018-02-03 DIAGNOSIS — Z79899 Other long term (current) drug therapy: Secondary | ICD-10-CM | POA: Diagnosis not present

## 2018-02-03 DIAGNOSIS — Z881 Allergy status to other antibiotic agents status: Secondary | ICD-10-CM | POA: Insufficient documentation

## 2018-02-03 DIAGNOSIS — I10 Essential (primary) hypertension: Secondary | ICD-10-CM | POA: Diagnosis present

## 2018-02-03 DIAGNOSIS — Z885 Allergy status to narcotic agent status: Secondary | ICD-10-CM | POA: Insufficient documentation

## 2018-02-03 DIAGNOSIS — R008 Other abnormalities of heart beat: Secondary | ICD-10-CM | POA: Insufficient documentation

## 2018-02-03 DIAGNOSIS — F419 Anxiety disorder, unspecified: Secondary | ICD-10-CM | POA: Insufficient documentation

## 2018-02-03 DIAGNOSIS — Z880 Allergy status to penicillin: Secondary | ICD-10-CM | POA: Diagnosis not present

## 2018-02-03 DIAGNOSIS — Z951 Presence of aortocoronary bypass graft: Secondary | ICD-10-CM | POA: Diagnosis not present

## 2018-02-03 DIAGNOSIS — Z9049 Acquired absence of other specified parts of digestive tract: Secondary | ICD-10-CM | POA: Diagnosis not present

## 2018-02-03 DIAGNOSIS — I2510 Atherosclerotic heart disease of native coronary artery without angina pectoris: Secondary | ICD-10-CM | POA: Insufficient documentation

## 2018-02-03 DIAGNOSIS — K8030 Calculus of bile duct with cholangitis, unspecified, without obstruction: Secondary | ICD-10-CM | POA: Diagnosis not present

## 2018-02-03 DIAGNOSIS — K8050 Calculus of bile duct without cholangitis or cholecystitis without obstruction: Secondary | ICD-10-CM | POA: Diagnosis present

## 2018-02-03 DIAGNOSIS — I493 Ventricular premature depolarization: Secondary | ICD-10-CM | POA: Insufficient documentation

## 2018-02-03 DIAGNOSIS — Z7902 Long term (current) use of antithrombotics/antiplatelets: Secondary | ICD-10-CM | POA: Insufficient documentation

## 2018-02-03 DIAGNOSIS — F329 Major depressive disorder, single episode, unspecified: Secondary | ICD-10-CM | POA: Insufficient documentation

## 2018-02-03 DIAGNOSIS — I498 Other specified cardiac arrhythmias: Secondary | ICD-10-CM

## 2018-02-03 DIAGNOSIS — I119 Hypertensive heart disease without heart failure: Secondary | ICD-10-CM | POA: Diagnosis not present

## 2018-02-03 DIAGNOSIS — Z8249 Family history of ischemic heart disease and other diseases of the circulatory system: Secondary | ICD-10-CM | POA: Insufficient documentation

## 2018-02-03 DIAGNOSIS — Z888 Allergy status to other drugs, medicaments and biological substances status: Secondary | ICD-10-CM | POA: Insufficient documentation

## 2018-02-03 DIAGNOSIS — J90 Pleural effusion, not elsewhere classified: Secondary | ICD-10-CM | POA: Diagnosis not present

## 2018-02-03 DIAGNOSIS — I252 Old myocardial infarction: Secondary | ICD-10-CM | POA: Insufficient documentation

## 2018-02-03 DIAGNOSIS — I491 Atrial premature depolarization: Secondary | ICD-10-CM | POA: Diagnosis not present

## 2018-02-03 DIAGNOSIS — I259 Chronic ischemic heart disease, unspecified: Secondary | ICD-10-CM | POA: Diagnosis not present

## 2018-02-03 DIAGNOSIS — R079 Chest pain, unspecified: Secondary | ICD-10-CM | POA: Diagnosis not present

## 2018-02-03 DIAGNOSIS — Z7982 Long term (current) use of aspirin: Secondary | ICD-10-CM | POA: Diagnosis not present

## 2018-02-03 DIAGNOSIS — E785 Hyperlipidemia, unspecified: Secondary | ICD-10-CM | POA: Diagnosis not present

## 2018-02-03 DIAGNOSIS — R072 Precordial pain: Secondary | ICD-10-CM | POA: Diagnosis not present

## 2018-02-03 DIAGNOSIS — I499 Cardiac arrhythmia, unspecified: Secondary | ICD-10-CM

## 2018-02-03 DIAGNOSIS — Z8673 Personal history of transient ischemic attack (TIA), and cerebral infarction without residual deficits: Secondary | ICD-10-CM | POA: Insufficient documentation

## 2018-02-03 DIAGNOSIS — Z8674 Personal history of sudden cardiac arrest: Secondary | ICD-10-CM | POA: Insufficient documentation

## 2018-02-03 DIAGNOSIS — R0789 Other chest pain: Secondary | ICD-10-CM | POA: Diagnosis not present

## 2018-02-03 DIAGNOSIS — E7800 Pure hypercholesterolemia, unspecified: Secondary | ICD-10-CM | POA: Diagnosis present

## 2018-02-03 HISTORY — DX: Ventricular premature depolarization: I49.3

## 2018-02-03 HISTORY — DX: Unspecified atrial fibrillation: I48.91

## 2018-02-03 HISTORY — DX: Other postprocedural complications and disorders of the circulatory system, not elsewhere classified: I97.89

## 2018-02-03 HISTORY — DX: Calculus of bile duct without cholangitis or cholecystitis without obstruction: K80.50

## 2018-02-03 HISTORY — DX: Other cholangitis: K83.09

## 2018-02-03 LAB — LIPASE, BLOOD: Lipase: 39 U/L (ref 11–51)

## 2018-02-03 LAB — CBC
HCT: 39.6 % (ref 36.0–46.0)
HEMOGLOBIN: 12.9 g/dL (ref 12.0–15.0)
MCH: 29.9 pg (ref 26.0–34.0)
MCHC: 32.6 g/dL (ref 30.0–36.0)
MCV: 91.9 fL (ref 80.0–100.0)
Platelets: 165 10*3/uL (ref 150–400)
RBC: 4.31 MIL/uL (ref 3.87–5.11)
RDW: 13.1 % (ref 11.5–15.5)
WBC: 4.5 10*3/uL (ref 4.0–10.5)
nRBC: 0 % (ref 0.0–0.2)

## 2018-02-03 LAB — I-STAT TROPONIN, ED: Troponin i, poc: 0 ng/mL (ref 0.00–0.08)

## 2018-02-03 LAB — HEPATIC FUNCTION PANEL
ALT: 22 U/L (ref 0–44)
AST: 32 U/L (ref 15–41)
Albumin: 3.8 g/dL (ref 3.5–5.0)
Alkaline Phosphatase: 89 U/L (ref 38–126)
Bilirubin, Direct: 0.3 mg/dL — ABNORMAL HIGH (ref 0.0–0.2)
Indirect Bilirubin: 1.5 mg/dL — ABNORMAL HIGH (ref 0.3–0.9)
Total Bilirubin: 1.8 mg/dL — ABNORMAL HIGH (ref 0.3–1.2)
Total Protein: 7 g/dL (ref 6.5–8.1)

## 2018-02-03 LAB — URINALYSIS, ROUTINE W REFLEX MICROSCOPIC
Bilirubin Urine: NEGATIVE
Glucose, UA: NEGATIVE mg/dL
Hgb urine dipstick: NEGATIVE
Ketones, ur: NEGATIVE mg/dL
Leukocytes, UA: NEGATIVE
Nitrite: NEGATIVE
Protein, ur: NEGATIVE mg/dL
Specific Gravity, Urine: 1.005 (ref 1.005–1.030)
pH: 6 (ref 5.0–8.0)

## 2018-02-03 LAB — BASIC METABOLIC PANEL
Anion gap: 8 (ref 5–15)
BUN: 15 mg/dL (ref 8–23)
CO2: 28 mmol/L (ref 22–32)
Calcium: 9.4 mg/dL (ref 8.9–10.3)
Chloride: 103 mmol/L (ref 98–111)
Creatinine, Ser: 0.8 mg/dL (ref 0.44–1.00)
GFR calc Af Amer: >60 mL/min (ref >60)
GFR calc non Af Amer: >60 mL/min (ref >60)
GLUCOSE: 170 mg/dL — AB (ref 70–99)
Potassium: 3.5 mmol/L (ref 3.5–5.1)
Sodium: 139 mmol/L (ref 135–145)

## 2018-02-03 LAB — MAGNESIUM: Magnesium: 2 mg/dL (ref 1.7–2.4)

## 2018-02-03 LAB — TROPONIN I: Troponin I: <0.03 ng/mL (ref <0.03)

## 2018-02-03 MED ORDER — ESTROGENS CONJUGATED 0.3 MG PO TABS
0.3000 mg | ORAL_TABLET | Freq: Every day | ORAL | Status: DC
  Administered 2018-02-04: 0.3 mg via ORAL
  Filled 2018-02-03: qty 1

## 2018-02-03 MED ORDER — LORAZEPAM 1 MG PO TABS
0.5000 mg | ORAL_TABLET | Freq: Three times a day (TID) | ORAL | Status: DC | PRN
  Administered 2018-02-04: 0.5 mg via ORAL
  Filled 2018-02-03: qty 1

## 2018-02-03 MED ORDER — CALCIUM CARBONATE-VITAMIN D 500-200 MG-UNIT PO TABS
1.0000 | ORAL_TABLET | Freq: Every morning | ORAL | Status: DC
  Administered 2018-02-04: 1 via ORAL
  Filled 2018-02-03: qty 1

## 2018-02-03 MED ORDER — ASPIRIN EC 81 MG PO TBEC
81.0000 mg | DELAYED_RELEASE_TABLET | Freq: Every day | ORAL | Status: DC
  Administered 2018-02-04: 81 mg via ORAL
  Filled 2018-02-03: qty 1

## 2018-02-03 MED ORDER — AMLODIPINE BESYLATE 5 MG PO TABS
5.0000 mg | ORAL_TABLET | Freq: Every day | ORAL | Status: DC
  Administered 2018-02-04: 5 mg via ORAL
  Filled 2018-02-03: qty 1

## 2018-02-03 MED ORDER — POTASSIUM CHLORIDE CRYS ER 20 MEQ PO TBCR: 40.0000 meq | EXTENDED_RELEASE_TABLET | Freq: Once | ORAL | Status: DC

## 2018-02-03 MED ORDER — ENOXAPARIN SODIUM 40 MG/0.4ML ~~LOC~~ SOLN
40.0000 mg | SUBCUTANEOUS | Status: DC
  Administered 2018-02-03: 40 mg via SUBCUTANEOUS
  Filled 2018-02-03: qty 0.4

## 2018-02-03 MED ORDER — CALCIUM CARBONATE ANTACID 500 MG PO CHEW
2.0000 | CHEWABLE_TABLET | ORAL | Status: DC | PRN
  Administered 2018-02-04: 400 mg via ORAL
  Filled 2018-02-03: qty 2

## 2018-02-03 MED ORDER — ADULT MULTIVITAMIN W/MINERALS CH
1.0000 | ORAL_TABLET | Freq: Every day | ORAL | Status: DC
  Administered 2018-02-04: 1 via ORAL
  Filled 2018-02-03: qty 1

## 2018-02-03 MED ORDER — LOSARTAN POTASSIUM 50 MG PO TABS
50.0000 mg | ORAL_TABLET | Freq: Two times a day (BID) | ORAL | Status: DC
  Administered 2018-02-04: 50 mg via ORAL
  Filled 2018-02-03: qty 1

## 2018-02-03 MED ORDER — ACETAMINOPHEN 325 MG PO TABS: 650.0000 mg | ORAL_TABLET | ORAL | Status: DC | PRN

## 2018-02-03 MED ORDER — FERROUS SULFATE 325 (65 FE) MG PO TABS
325.0000 mg | ORAL_TABLET | Freq: Every day | ORAL | Status: DC
  Filled 2018-02-03: qty 1

## 2018-02-03 MED ORDER — PANTOPRAZOLE SODIUM 40 MG PO TBEC
40.0000 mg | DELAYED_RELEASE_TABLET | Freq: Every day | ORAL | Status: DC
  Administered 2018-02-04: 40 mg via ORAL
  Filled 2018-02-03: qty 1

## 2018-02-03 MED ORDER — ONDANSETRON HCL 4 MG PO TABS: 4.0000 mg | ORAL_TABLET | Freq: Three times a day (TID) | ORAL | Status: DC | PRN

## 2018-02-03 MED ORDER — METOPROLOL SUCCINATE ER 25 MG PO TB24
50.0000 mg | ORAL_TABLET | Freq: Every day | ORAL | Status: DC
  Administered 2018-02-04: 50 mg via ORAL
  Filled 2018-02-03 (×2): qty 2

## 2018-02-03 MED ORDER — FUROSEMIDE 20 MG PO TABS
40.0000 mg | ORAL_TABLET | Freq: Every day | ORAL | Status: DC
  Administered 2018-02-04: 40 mg via ORAL
  Filled 2018-02-03: qty 2

## 2018-02-03 MED ORDER — ATORVASTATIN CALCIUM 80 MG PO TABS
80.0000 mg | ORAL_TABLET | Freq: Every day | ORAL | Status: DC
  Administered 2018-02-03: 80 mg via ORAL
  Filled 2018-02-03 (×2): qty 1

## 2018-02-03 MED ORDER — ACETAMINOPHEN 325 MG PO TABS: 650.0000 mg | ORAL_TABLET | Freq: Four times a day (QID) | ORAL | Status: DC | PRN

## 2018-02-03 MED ORDER — ONDANSETRON HCL 4 MG/2ML IJ SOLN: 4.0000 mg | Freq: Four times a day (QID) | INTRAMUSCULAR | Status: DC | PRN

## 2018-02-03 MED ORDER — CLOPIDOGREL BISULFATE 75 MG PO TABS
75.0000 mg | ORAL_TABLET | Freq: Every day | ORAL | Status: DC
  Administered 2018-02-03: 75 mg via ORAL
  Filled 2018-02-03 (×2): qty 1

## 2018-02-03 MED ORDER — NITROGLYCERIN 0.4 MG SL SUBL: 0.4000 mg | SUBLINGUAL_TABLET | SUBLINGUAL | Status: DC | PRN

## 2018-02-03 MED ORDER — POTASSIUM CHLORIDE CRYS ER 20 MEQ PO TBCR
20.0000 meq | EXTENDED_RELEASE_TABLET | Freq: Two times a day (BID) | ORAL | Status: DC
  Administered 2018-02-03 – 2018-02-04 (×2): 20 meq via ORAL
  Filled 2018-02-03 (×2): qty 1

## 2018-02-03 NOTE — ED Triage Notes (Signed)
Pt here via [facility] after having intermittent sharp, burning, substernal chest pain that woke her up at 4 am today. EMS reports frequent PVC's that co inside with pain in route. Pt took 324 ASA and 3 nitro today with some relief, the last one 2 hours ago. Recent stent placement. No pain on arrival.

## 2018-02-03 NOTE — ED Provider Notes (Signed)
[facility] EMERGENCY DEPARTMENT Provider Note   CSN: [id number] Arrival date & time: 02/03/18  1301     History   Chief Complaint Chief Complaint  Patient presents with  . Chest Pain    HPI [name] is a 85 y.o. female.  The history is provided by the patient and medical records. No language interpreter was used.  Chest Pain  Pain location:  Substernal area Pain quality: aching, dull and hot   Pain radiates to:  Does not radiate Pain severity:  Severe Onset quality:  Sudden Duration:  1 day Timing:  Intermittent Progression:  Waxing and waning Chronicity:  Recurrent Relieved by:  Nothing Worsened by:  Nothing Associated symptoms: no abdominal pain, no back pain, no cough, no fatigue, no fever, no headache, no nausea, no palpitations, no shortness of breath and no vomiting     Past Medical History:  Diagnosis Date  . Anxiety   . Atypical carcinoid lung tumor ([place])   . CAD (coronary artery disease)    a. 1986: s/p PTCA of bifurcation lesion in LAD;  b. 2001: s/p BMS to LAD & LCX;  c. 04/2006 Cath: diff CAD w/o critical narrowing, nl EF;  d. 12/2014 MV: inflat ST dep, mild basal inf ischemia, EF 55-65%-->Low risk; e. 12/2016 Cath: LM 90d, LAD 90ost, 50p, 58m, LCX 80ost, 17m, RCA 90p, 70m/100-CTO w/ L->R collats filling RPDA.  . Cancer ([place])   . Carotid arterial disease ([place])    a. 08/2015 Carotid U/S: < 39% bilat ICA stenosis.  . Carotid disease, bilateral ([place])    a. RICA 4-09%, LICA 81-19%  . Cataracts, bilateral   . Cholelithiases   . Chronic diarrhea   . Depression   . Gilbert's syndrome   . History of echocardiogram    a. 06/2010 Echo: EF 60-65%, mild AI/MR; b. 10/2015 Echo: EF 60-65%, no rwma, mild MR, nl RV fxn.  Marland Kitchen HTN (hypertension)   . Hyperlipidemia   . Impaired fasting blood sugar   . Keloid    mid sternum  . Macular degeneration   . Osteoarthritis   . Positional vertigo    benign  . S/P CABG x 3 12/23/2016   LIMA to LAD, SVG  to OM, SVG to PDA, EVH via left thigh  . Symptoms, such as flushing, sleeplessness, headache, lack of concentration, associated with the menopause   . TIA (transient ischemic attack)     Patient Active Problem List   Diagnosis Date Noted  . History of ERCP 01/25/2018  . History of biliary stent insertion 01/25/2018  . Coronary artery disease involving native coronary artery of native heart without angina pectoris   . Acute cholangitis 12/08/2017  . Elevated troponin 12/08/2017  . Choledocholithiasis 12/08/2017  . S/P CABG x 3 12/23/2016  . On intra-aortic balloon pump assist   . NSTEMI (non-ST elevated myocardial infarction) ([place]) 12/22/2016  . Weakness of lower extremity 12/09/2015  . Viral URI 11/07/2015  . Hypokalemia 11/07/2015  . Cough   . Diaphoresis   . Sore throat   . Non-ST elevation (NSTEMI) myocardial infarction ([place]) 12/29/2014  . Chest pain 12/28/2014  . Angina pectoris ([place])   . Coronary artery disease involving native coronary artery of native heart with unstable angina pectoris ([place])   . Emesis   . Gastroenteritis, acute   . PVC's (premature ventricular contractions) 04/25/2012  . Cardiac arrest ([place])   . Left carotid stenosis 03/01/2012  . Leg fatigue 03/01/2012  . Dysuria 03/30/2011  .  Mitral regurgitation 03/30/2011  . HYPERCHOLESTEROLEMIA  IIA 08/06/2008  . Carotid bruit 05/28/2008  . Essential hypertension 05/11/2008  . Coronary atherosclerosis 05/11/2008  . Angina at rest [facility]) 05/11/2008    Past Surgical History:  Procedure Laterality Date  . ABDOMINAL AORTOGRAM N/A 12/22/2016   Procedure: ABDOMINAL AORTOGRAM;  Surgeon: Nelva Bush, MD;  Location: [facility] CV LAB;  Service: Cardiovascular;  Laterality: N/A;  . ABDOMINAL HYSTERECTOMY    . BILIARY STENT PLACEMENT  12/10/2017   Procedure: BILIARY STENT PLACEMENT;  Surgeon: Irving Copas., MD;  Location: [facility];  Service: Gastroenterology;;  . CHOLECYSTECTOMY    . CORONARY  ARTERY BYPASS GRAFT N/A 12/23/2016   Procedure: CORONARY ARTERY BYPASS GRAFTING (CABG) TIMES THREE USING LEFT INTERNAL MAMMARY ARTERY AND LEFT SAPHENOUS LEG VEIN HARVESTED ENDOSCOPICALLY,;  Surgeon: Rexene Alberts, MD;  Location: [facility];  Service: Open Heart Surgery;  Laterality: N/A;  . ERCP N/A 12/10/2017   Procedure: ENDOSCOPIC RETROGRADE CHOLANGIOPANCREATOGRAPHY (ERCP);  Surgeon: Irving Copas., MD;  Location: [facility];  Service: Gastroenterology;  Laterality: N/A;  . IABP INSERTION N/A 12/22/2016   Procedure: IABP Insertion;  Surgeon: Nelva Bush, MD;  Location: [facility] [place] CV LAB;  Service: Cardiovascular;  Laterality: N/A;  . LEFT HEART CATH AND CORONARY ANGIOGRAPHY N/A 12/22/2016   Procedure: LEFT HEART CATH AND CORONARY ANGIOGRAPHY;  Surgeon: Nelva Bush, MD;  Location: [facility] CV LAB;  Service: Cardiovascular;  Laterality: N/A;  . LEFT HEART CATHETERIZATION WITH CORONARY ANGIOGRAM N/A 04/27/2012   Procedure: LEFT HEART CATHETERIZATION WITH CORONARY ANGIOGRAM;  Surgeon: Peter M Martinique, MD;  Location: [facility] CATH LAB;  Service: Cardiovascular;  Laterality: N/A;  . LOBECTOMY Right    RLL atypical carcinoid tumor - Dr Arlyce Dice  . pci  1986  . PCI of LAD  2001   with non des lad   . pci of mid cfx     with non des and cutting balloon pci of av circumflex  . TEE WITHOUT CARDIOVERSION N/A 12/23/2016   Procedure: TRANSESOPHAGEAL ECHOCARDIOGRAM (TEE);  Surgeon: Rexene Alberts, MD;  Location: [facility];  Service: Open Heart Surgery;  Laterality: N/A;     OB History   No obstetric history on file.      Home Medications    Prior to Admission medications   Medication Sig Start Date End Date Taking? Authorizing Provider  acetaminophen (TYLENOL) 325 MG tablet Take 2 tablets (650 mg total) by mouth every 6 (six) hours as needed for mild pain or headache. 01/02/17   Barrett, Erin R, PA-C  amLODipine (NORVASC) 10 MG tablet Take 1 tablet (10 mg total) by mouth  daily. Patient taking differently: Take 5 mg by mouth daily.  01/02/17   Barrett, Lodema Hong, PA-C  aspirin 81 MG tablet Take 81 mg by mouth daily.    [provider]  atorvastatin (LIPITOR) 80 MG tablet Take 1 tablet (80 mg total) by mouth daily. 01/02/17   Barrett, Erin R, PA-C  Calcium Carbonate-Vitamin D (CALCIUM 600 + D PO) Take 1 tablet by mouth daily. 600 mg/ 400 mg    [provider]  clopidogrel (PLAVIX) 75 MG tablet Take 1 tablet (75 mg total) by mouth daily. 01/02/17   Barrett, Erin R, PA-C  estrogens, conjugated, (PREMARIN) 0.3 MG tablet Take 0.3 mg by mouth daily. Take daily for 21 days then do not take for 7 days.    [provider]  furosemide (LASIX) 20 MG tablet Take 40 mg by mouth daily.  [provider]  IRON PO Take 65 mg by mouth.    [provider]  KLOR-CON M20 20 MEQ tablet TAKE 1 TABLET (20 MEQ TOTAL) BY MOUTH 2 (TWO) TIMES DAILY. 11/01/17   Minna Merritts, MD  LORazepam (ATIVAN) 0.5 MG tablet Take 1 tablet (0.5 mg total) by mouth every 8 (eight) hours as needed for anxiety. 01/02/17   Barrett, Erin R, PA-C  losartan (COZAAR) 100 MG tablet Take 0.5 tablets (50 mg total) by mouth 2 (two) times daily. 08/03/17   Minna Merritts, MD  metoprolol succinate (TOPROL-XL) 50 MG 24 hr tablet Take 50 mg by mouth daily.  07/31/17   [provider]  Multiple Vitamin (MULTIVITAMIN) tablet Take 1 tablet by mouth daily.    [provider]  nitroGLYCERIN (NITROSTAT) 0.4 MG SL tablet PLACE 1 TABLET (0.4 MG TOTAL) UNDER THE TONGUE EVERY 5 (FIVE) MINUTES AS NEEDED FOR CHEST PAIN. 01/04/17   End, Harrell Gave, MD  ondansetron (ZOFRAN) 4 MG tablet Take 1 tablet (4 mg total) by mouth every 8 (eight) hours as needed for nausea or vomiting. 12/12/17   Thurnell Lose, MD    Family History Family History  Problem Relation Age of Onset  . Diabetes Mellitus I Mother   . CAD Mother   . CAD Father   . Diabetes Mellitus I Sister   .  Diabetes Mellitus I Brother   . Diabetes Mellitus I Sister   . Colon cancer Neg Hx   . Esophageal cancer Neg Hx   . Inflammatory bowel disease Neg Hx   . Liver disease Neg Hx   . Pancreatic cancer Neg Hx   . Rectal cancer Neg Hx   . Stomach cancer Neg Hx     Social History Social History   Tobacco Use  . Smoking status: Never Smoker  . Smokeless tobacco: Never Used  Substance Use Topics  . Alcohol use: No  . Drug use: No     Allergies   Sertraline hcl; Biaxin [clarithromycin]; Celecoxib; Ciprofloxacin; Codeine; Hydromorphone; Lisinopril; Macrobid [nitrofurantoin monohyd macro]; Oxycodone hcl; Prednisone; Sertraline hcl; and Penicillins   Review of Systems Review of Systems  Constitutional: Negative for chills, fatigue and fever.  HENT: Negative for congestion.   Eyes: Negative for visual disturbance.  Respiratory: Negative for cough, chest tightness, shortness of breath and wheezing.   Cardiovascular: Positive for chest pain. Negative for palpitations.  Gastrointestinal: Negative for abdominal pain, constipation, diarrhea, nausea and vomiting.  Genitourinary: Negative for dysuria, flank pain and frequency.  Musculoskeletal: Negative for back pain, neck pain and neck stiffness.  Skin: Negative for rash and wound.  Neurological: Negative for light-headedness and headaches.  Psychiatric/Behavioral: Negative for agitation.  All other systems reviewed and are negative.    Physical Exam Updated Vital Signs BP (!) 168/66   Pulse (!) 45   Temp (!) 97.2 F (36.2 C) (Oral)   Resp 13   Ht 5' 5.75" (1.67 m)   Wt 79.4 kg   SpO2 100%   BMI 28.46 kg/m   Physical Exam Vitals signs and nursing note reviewed.  Constitutional:      General: She is not in acute distress.    Appearance: She is well-developed. She is not ill-appearing, toxic-appearing or diaphoretic.  HENT:     Head: Normocephalic and atraumatic.  Eyes:     Extraocular Movements: Extraocular movements  intact.     Conjunctiva/sclera: Conjunctivae normal.     Pupils: Pupils are equal, round, and reactive to  light.  Neck:     Musculoskeletal: Normal range of motion and neck supple.  Cardiovascular:     Rate and Rhythm: Normal rate and regular rhythm.     Heart sounds: Murmur present.  Pulmonary:     Effort: Pulmonary effort is normal. No respiratory distress.     Breath sounds: Normal breath sounds. No decreased breath sounds, wheezing, rhonchi or rales.  Abdominal:     Palpations: Abdomen is soft.     Tenderness: There is no abdominal tenderness.  Musculoskeletal:     Right lower leg: She exhibits no tenderness. No edema.     Left lower leg: She exhibits no tenderness. No edema.  Skin:    General: Skin is warm and dry.     Capillary Refill: Capillary refill takes less than 2 seconds.     Findings: No ecchymosis or erythema.  Neurological:     General: No focal deficit present.     Mental Status: She is alert.      ED Treatments / Results  Labs (all labs ordered are listed, but only abnormal results are displayed) Labs Reviewed  BASIC METABOLIC PANEL - Abnormal; Notable for the following components:      Result Value   Glucose, Bld 170 (*)    All other components within normal limits  HEPATIC FUNCTION PANEL - Abnormal; Notable for the following components:   Total Bilirubin 1.8 (*)    Bilirubin, Direct 0.3 (*)    Indirect Bilirubin 1.5 (*)    All other components within normal limits  URINE CULTURE  CBC  LIPASE, BLOOD  URINALYSIS, ROUTINE W REFLEX MICROSCOPIC  MAGNESIUM  I-STAT TROPONIN, ED    EKG EKG Interpretation  Date/Time:  Thursday February 03 2018 13:11:45 EST Ventricular Rate:  72 PR Interval:    QRS Duration: 87 QT Interval:  426 QTC Calculation: 404 R Axis:   68 Text Interpretation:  Sinus rhythm Ventricular bigeminy Left atrial enlargement Borderline ST depression, lateral leads when compared to prior, new Bigeminy,  No STEMI Confirmed by Antony Blackbird [id number]) on 02/03/2018 1:54:12 PM   Radiology Dg Chest 2 View  Result Date: 02/03/2018 CLINICAL DATA:  Chest pain for 1 day. EXAM: CHEST - 2 VIEW COMPARISON:  PA and lateral chest 01/19/2018 and 02/04/2017. FINDINGS: Lungs are clear. Small right pleural effusion is unchanged since the most recent exam. No left effusion. No pneumothorax. Heart size is normal. Aortic atherosclerosis noted. IMPRESSION: Small right pleural effusion, unchanged since the most recent exam. Atherosclerosis. Electronically Signed   By: Inge Rise M.D.   On: 02/03/2018 14:15    Procedures Procedures (including critical care time)  Medications Ordered in ED Medications - No data to display   Initial Impression / Assessment and Plan / ED Course  I have reviewed the triage vital signs and the nursing notes.  Pertinent labs & imaging results that were available during my care of the patient were reviewed by me and considered in my medical decision making (see chart for details).     [name] is a 85 y.o. female with a past medical history significant for CAD with PCI and CABG, prior cardiac arrest, pretension, hypercholesterolemia, carotid disease, and prior cholangitis status post biliary stent who presents with chest pain.  Patient reports that she has been having chest pain today that is sharp and central in her chest.  She reports that the quality is similar to discomfort she had with prior MI.  She reports no associated  shortness of breath, diaphoresis, nausea, or vomiting.  It does not radiate.  It is not in her abdomen.  She denies any leg pain or leg swelling.  No recent trauma.  No recent fevers, chills, or other symptoms.  Patient reports that the pain was a 10 out of 10 in severity initially and then she took aspirin and nitroglycerin and her symptoms improved.  She is now chest pain-free.  On exam, lungs are clear and chest is nontender.  Abdomen is nontender.  Patient has a systolic murmur.   Abdomen is completely nontender, back is nontender, no CVA tenderness.  Legs are nontender nonedematous.  Patient resting comfortably on room air.  EKG shows no STEMI however it does show patient is in a new ventricular bigeminy.  Patient history laboratory testing showing no elevation in her troponin.  Chest x-ray shows a small pleural effusion similar to prior.  No pneumothorax or pneumonia.  Laboratory testing otherwise reassuring.  Given her new bigeminy and her chest pain feeling similar to prior MI, cardiology was called.  Cardiology will assess patient and I anticipate admit.   Final Clinical Impressions(s) / ED Diagnoses   Final diagnoses:  Precordial pain  Ventricular bigeminy    Clinical Impression: 1. Precordial pain   2. Ventricular bigeminy     Disposition: Awaiting cardiology recommendations for further management of chest pain similar to prior MI discomfort and new bigeminy.  This note was prepared with assistance of Systems analyst. Occasional wrong-word or sound-a-like substitutions may have occurred due to the inherent limitations of voice recognition software.       Tegeler, Gwenyth Allegra, MD 02/03/18 [phone]

## 2018-02-03 NOTE — ED Provider Notes (Signed)
  Physical Exam  BP (!) 152/66   Pulse (!) 31   Temp (!) 97.2 F (36.2 C) (Oral)   Resp 18   Ht 5' 5.75" (1.67 m)   Wt 79.4 kg   SpO2 100%   BMI 28.46 kg/m   Physical Exam  ED Course/Procedures     Procedures  MDM  Patient is now been seen by cardiology.  Think it is unlikely cardiac cause but states they cannot completely rule it out.  Discussed with Dr. Harrington Challenger.  Thinks admission would be prudent.  Also worried about her choledocholithiasis.  Bilirubin mildly increased but otherwise LFTs reassuring compared to prior.  Will admit to hospitalist       Davonna Belling, MD 02/03/18 1710

## 2018-02-03 NOTE — Consult Note (Addendum)
Cardiology Consultation    Patient ID: [name] MRN: [id number], DOB: January 03, 1931 Date of Encounter: 02/03/2018, 3:16 PM Primary Physician: Lajean Manes, MD Primary Cardiologist: Ida Rogue, MD Primary Electrophysiologist:  None  Chief Complaint: chest pain Reason for Admission: chest pain Requesting MD: Dr. Sherry Ruffing  HPI: [name] is a 85 y.o. female with history of CAD s/p prior PCIs then CABG 11/2016, frequent PVCs and prior bigeminy, ongoing management of choledocholithasis/cholangitis, prior cholecystectomy, HTN, carotid artery disease, atypical carcinoid tumor s/p RLL lobectomy, TIA, cataracts, chronic diarrhea, macular degeneration, vertigo, osteoarthritis, Gilbert's syndrome whom we are asked to see for chest pain.  She has a history of PTCA of LAD in 1986 and BMS to LAD and LCx 2001. Repeat cardiac cath in April 2014 showed an occluded RCA with a patent LAD and left circumflex stents.  She had recurrent chest pain in 12/2016 and underwent three-vessel CABG (LIMA>LAD, SVG>PDA, SVG>OM) after cath showed critical left main disease with severe three-vessel CAD. She had postoperative atrial fibrillation treated with amiodarone. She was more recently seen by the cardiology team in 11/2017 for elevated troponin in the setting of abdominal pain, nausea, and vomiting, with diagnosis of cholangitis. Biliary stenting was performed. The troponin was felt due to demand ischemia given underlying CAD and metabolic stressor. 2D echo 11/2017 showed EF 55-60%, grade 1 DD, moderate MR, mild TR, moderate pulm HTN. She has since seen GI back and it was discussed that multiple ERCPs may be required to completely clear the duct, and this was planned for 02/21/18.  In general she's been doing well recently otherwise - not particularly active, but no angina with ADLs. She has occasional pings of sharp pain around her sternal incision from time to time but today seemed more persistent. She presented to  the ER with sharp intermittent lower chest/upper epigastric pains. She awoke this morning and felt a sharp discomfort that lasted several minutes in the above location, long enough to warrant taking a SL NTG. It took several minutes before she felt better and dozed off again. However, over the remainder of the morning as she got up and went about her day she would occasionally develop recurrences that seemed to happen at random. They occurred both at rest and moving about, but not every time she moved around. It would last anywhere from 1-10 minutes each time and spontaneously resolve. She took 2 more NTG over the course of the morning and did find some relief but again only after a few minutes' time. The pain is not worse with inspiration or exertion. She does report she felt extremely cold this morning like she couldn't get warm. No overt chills though. She also experienced several bowel movements back to back (but not loose) as well as excessive urination. She does have some epigastric tenderness to palpation but this feels differently than the sharp pain she'd experienced. She has not had any recurrent pain in several hours. She denies any SOB, nausea, vomiting diaphoresis, or palpitations. Her prior anginal equivalent was substernal chest pain with severe back pain between her shoulder blades, which she has not experienced. She currently has no complaints.   BP elevated, 168/66, pulse normal, pulse ox normal, afebrile. Labs show elevated glucose of 170 and Tbili of 1.8, otherwise unremarkable. Normal troponin. UA wnl. CXR nonacute. EKG shows NSR with ventricular bigeminy, otherwise nonspecific STT changes but appears grossly similar to prior. She continues to have PVCs on telemetry but is asymptomatic with these.  Past Medical History:  Diagnosis Date  . Anxiety   . Atypical carcinoid lung tumor ([place])   . CAD (coronary artery disease)    a. 1986: s/p PTCA of bifurcation lesion in LAD;  b. 2001: s/p  BMS to LAD & LCX;  c. 04/2006 Cath: diff CAD w/o critical narrowing, nl EF;  d. 12/2014 MV: inflat ST dep, mild basal inf ischemia, EF 55-65%-->Low risk; e. 12/2016 - 3V CABG.  . Cancer ([place])   . Carotid disease, bilateral ([place])    a. 08/2015 Carotid U/S: < 39% bilat ICA stenosis.  . Cataracts, bilateral   . Cholangitis   . Choledocholithiasis   . Cholelithiases   . Chronic diarrhea   . Depression   . Frequent PVCs   . Gilbert's syndrome   . History of echocardiogram    a. 06/2010 Echo: EF 60-65%, mild AI/MR; b. 10/2015 Echo: EF 60-65%, no rwma, mild MR, nl RV fxn.  Marland Kitchen HTN (hypertension)   . Hyperlipidemia   . Impaired fasting blood sugar   . Keloid    mid sternum  . Macular degeneration   . Osteoarthritis   . Positional vertigo    benign  . Postoperative atrial fibrillation ([place]) 12/2016  . S/P CABG x 3 12/23/2016   LIMA to LAD, SVG to OM, SVG to PDA, EVH via left thigh  . Symptoms, such as flushing, sleeplessness, headache, lack of concentration, associated with the menopause   . TIA (transient ischemic attack)      Surgical History:  Past Surgical History:  Procedure Laterality Date  . ABDOMINAL AORTOGRAM N/A 12/22/2016   Procedure: ABDOMINAL AORTOGRAM;  Surgeon: Nelva Bush, MD;  Location: [facility];  Service: Cardiovascular;  Laterality: N/A;  . ABDOMINAL HYSTERECTOMY    . BILIARY STENT PLACEMENT  12/10/2017   Procedure: BILIARY STENT PLACEMENT;  Surgeon: Irving Copas., MD;  Location: [facility];  Service: Gastroenterology;;  . CHOLECYSTECTOMY    . CORONARY ARTERY BYPASS GRAFT N/A 12/23/2016   Procedure: CORONARY ARTERY BYPASS GRAFTING (CABG) TIMES THREE USING LEFT INTERNAL MAMMARY ARTERY AND LEFT SAPHENOUS LEG VEIN HARVESTED ENDOSCOPICALLY,;  Surgeon: Rexene Alberts, MD;  Location: [facility];  Service: Open Heart Surgery;  Laterality: N/A;  . ERCP N/A 12/10/2017   Procedure: ENDOSCOPIC RETROGRADE CHOLANGIOPANCREATOGRAPHY (ERCP);  Surgeon:  Irving Copas., MD;  Location: [facility];  Service: Gastroenterology;  Laterality: N/A;  . IABP INSERTION N/A 12/22/2016   Procedure: IABP Insertion;  Surgeon: Nelva Bush, MD;  Location: [facility];  Service: Cardiovascular;  Laterality: N/A;  . LEFT HEART CATH AND CORONARY ANGIOGRAPHY N/A 12/22/2016   Procedure: LEFT HEART CATH AND CORONARY ANGIOGRAPHY;  Surgeon: Nelva Bush, MD;  Location: [facility];  Service: Cardiovascular;  Laterality: N/A;  . LEFT HEART CATHETERIZATION WITH CORONARY ANGIOGRAM N/A 04/27/2012   Procedure: LEFT HEART CATHETERIZATION WITH CORONARY ANGIOGRAM;  Surgeon: Peter M Martinique, MD;  Location: [facility];  Service: Cardiovascular;  Laterality: N/A;  . LOBECTOMY Right    RLL atypical carcinoid tumor - Dr Arlyce Dice  . pci  1986  . PCI of LAD  2001   with non des lad   . pci of mid cfx     with non des and cutting balloon pci of av circumflex  . TEE WITHOUT CARDIOVERSION N/A 12/23/2016   Procedure: TRANSESOPHAGEAL ECHOCARDIOGRAM (TEE);  Surgeon: Rexene Alberts, MD;  Location: [facility];  Service: Open Heart Surgery;  Laterality: N/A;     Home  Meds: Prior to Admission medications   Medication Sig Start Date End Date Taking? Authorizing Provider  acetaminophen (TYLENOL) 325 MG tablet Take 2 tablets (650 mg total) by mouth every 6 (six) hours as needed for mild pain or headache. 01/02/17  Yes Barrett, Erin R, PA-C  amLODipine (NORVASC) 10 MG tablet Take 1 tablet (10 mg total) by mouth daily. Patient taking differently: Take 5 mg by mouth daily.  01/02/17  Yes Barrett, Erin R, PA-C  aspirin 81 MG tablet Take 81 mg by mouth daily.   Yes [provider]  atorvastatin (LIPITOR) 80 MG tablet Take 1 tablet (80 mg total) by mouth daily. 01/02/17  Yes Barrett, Erin R, PA-C  calcium carbonate (TUMS EX) 750 MG chewable tablet Chew 2 tablets by mouth as needed for heartburn.   Yes [provider]  Calcium Carbonate-Vitamin D  (CALCIUM 600 + D PO) Take 1 tablet by mouth daily. 600 mg/ 400 mg   Yes [provider]  clopidogrel (PLAVIX) 75 MG tablet Take 1 tablet (75 mg total) by mouth daily. 01/02/17  Yes Barrett, Erin R, PA-C  estrogens, conjugated, (PREMARIN) 0.3 MG tablet Take 0.3 mg by mouth daily. Take daily for 21 days then do not take for 7 days.   Yes [provider]  furosemide (LASIX) 20 MG tablet Take 40 mg by mouth daily.    Yes [provider]  IRON PO Take 65 mg by mouth.   Yes [provider]  KLOR-CON M20 20 MEQ tablet TAKE 1 TABLET (20 MEQ TOTAL) BY MOUTH 2 (TWO) TIMES DAILY. Patient taking differently: Take 20 mEq by mouth 2 (two) times daily.  11/01/17  Yes Gollan, Kathlene November, MD  LORazepam (ATIVAN) 0.5 MG tablet Take 1 tablet (0.5 mg total) by mouth every 8 (eight) hours as needed for anxiety. 01/02/17  Yes Barrett, Erin R, PA-C  losartan (COZAAR) 100 MG tablet Take 0.5 tablets (50 mg total) by mouth 2 (two) times daily. 08/03/17  Yes Minna Merritts, MD  metoprolol succinate (TOPROL-XL) 50 MG 24 hr tablet Take 50 mg by mouth daily.  07/31/17  Yes [provider]  Multiple Vitamin (MULTIVITAMIN) tablet Take 1 tablet by mouth daily.   Yes [provider]  nitroGLYCERIN (NITROSTAT) 0.4 MG SL tablet PLACE 1 TABLET (0.4 MG TOTAL) UNDER THE TONGUE EVERY 5 (FIVE) MINUTES AS NEEDED FOR CHEST PAIN. 01/04/17  Yes End, Harrell Gave, MD  ondansetron (ZOFRAN) 4 MG tablet Take 1 tablet (4 mg total) by mouth every 8 (eight) hours as needed for nausea or vomiting. 12/12/17  Yes Thurnell Lose, MD  pantoprazole (PROTONIX) 40 MG tablet Take 40 mg by mouth daily. 12/20/17  Yes [provider]    Allergies:  Allergies  Allergen Reactions  . Sertraline Hcl Nausea Only  . Biaxin [Clarithromycin]     depressed  . Celecoxib Other (See Comments)    Reaction: Unknown  . Ciprofloxacin   . Codeine Other (See Comments)    Reaction: Unknown  . Hydromorphone     . Lisinopril     cough  . Macrobid [employer] Macro] Other (See Comments)    Reaction: Unknown  . Oxycodone Hcl Other (See Comments)    Reaction: Unknown  . Prednisone   . Sertraline Hcl Other (See Comments)    Reaction: Unknown  . Penicillins Rash    Reaction: Unknown    Social History   Socioeconomic History  . Marital status: Divorced    Spouse name:  Not on file  . Number of children: Not on file  . Years of education: Not on file  . Highest education level: Not on file  Occupational History  . Occupation: retired  Scientific laboratory technician  . Financial resource strain: Not on file  . Food insecurity:    Worry: Not on file    Inability: Not on file  . Transportation needs:    Medical: Not on file    Non-medical: Not on file  Tobacco Use  . Smoking status: Never Smoker  . Smokeless tobacco: Never Used  Substance and Sexual Activity  . Alcohol use: No  . Drug use: No  . Sexual activity: Not on file  Lifestyle  . Physical activity:    Days per week: Not on file    Minutes per session: Not on file  . Stress: Not on file  Relationships  . Social connections:    Talks on phone: Not on file    Gets together: Not on file    Attends religious service: Not on file    Active member of club or organization: Not on file    Attends meetings of clubs or organizations: Not on file    Relationship status: Not on file  . Intimate partner violence:    Fear of current or ex partner: Not on file    Emotionally abused: Not on file    Physically abused: Not on file    Forced sexual activity: Not on file  Other Topics Concern  . Not on file  Social History Narrative  . Not on file     Family History  Problem Relation Age of Onset  . Diabetes Mellitus I Mother   . CAD Mother   . CAD Father   . Diabetes Mellitus I Sister   . Diabetes Mellitus I Brother   . Diabetes Mellitus I Sister   . Colon cancer Neg Hx   . Esophageal cancer Neg Hx   . Inflammatory bowel disease  Neg Hx   . Liver disease Neg Hx   . Pancreatic cancer Neg Hx   . Rectal cancer Neg Hx   . Stomach cancer Neg Hx     Review of Systems: General: negative for chills, fever, night sweats or weight changes.  Cardiovascular: negative for chest pain, edema, orthopnea, palpitations, paroxysmal nocturnal dyspnea, shortness of breath or dyspnea on exertion Dermatological: negative for rash Respiratory: negative for cough or wheezing Urologic: negative for hematuria Abdominal: negative for nausea, vomiting, diarrhea, bright red blood per rectum, melena, or hematemesis Neurologic: negative for visual changes, syncope, or dizziness All other systems reviewed and are otherwise negative except as noted above.  Labs:   Lab Results  Component Value Date   WBC 4.5 02/03/2018   HGB 12.9 02/03/2018   HCT 39.6 02/03/2018   MCV 91.9 02/03/2018   PLT 165 02/03/2018    Recent Labs  Lab 02/03/18 1311 02/03/18 1319  NA  --  139  K  --  3.5  CL  --  103  CO2  --  28  BUN  --  15  CREATININE  --  0.80  CALCIUM  --  9.4  PROT 7.0  --   BILITOT 1.8*  --   ALKPHOS 89  --   ALT 22  --   AST 32  --   GLUCOSE  --  170*   No results for input(s): CKTOTAL, CKMB, TROPONINI in the last 72 hours. Lab Results  Component Value Date  CHOL 121 12/22/2016   HDL 48 12/22/2016   LDLCALC 47 12/22/2016   TRIG 129 12/22/2016   No results found for: DDIMER  Radiology/Studies:  Dg Chest 2 View  Result Date: 02/03/2018 CLINICAL DATA:  Chest pain for 1 day. EXAM: CHEST - 2 VIEW COMPARISON:  PA and lateral chest 01/19/2018 and 02/04/2017. FINDINGS: Lungs are clear. Small right pleural effusion is unchanged since the most recent exam. No left effusion. No pneumothorax. Heart size is normal. Aortic atherosclerosis noted. IMPRESSION: Small right pleural effusion, unchanged since the most recent exam. Atherosclerosis. Electronically Signed   By: Inge Rise M.D.   On: 02/03/2018 14:15   Dg Chest 2  View  Result Date: 01/20/2018 CLINICAL DATA:  Pleural effusion. EXAM: CHEST - 2 VIEW COMPARISON:  12/22/2017. FINDINGS: Prior CABG. Mild cardiomegaly. No pulmonary venous congestion. Small right pleural effusion. No pneumothorax. Biapical pleural thickening most consistent scarring. IMPRESSION: 1.  Prior CABG.  Cardiomegaly.  No pulmonary venous congestion. 2.  Small right pleural effusion.  No change from prior exam. Electronically Signed   By: Marcello Moores  Register   On: 01/20/2018 08:42   Wt Readings from Last 3 Encounters:  02/03/18 79.4 kg  01/19/18 80.3 kg  12/11/17 83.3 kg    EKG: EKG shows NSR with ventricular bigeminy, otherwise nonspecific STT changes but appears grossly similar to prior.  Physical Exam: Blood pressure (!) 168/66, pulse 68, temperature (!) 97.2 F (36.2 C), temperature source Oral, resp. rate 13, height 5' 5.75" (1.67 m), weight 79.4 kg, SpO2 100 %. Body mass index is 28.46 kg/m. General: Well developed, well nourished well appearing elderly WF in no acute distress. Head: Normocephalic, atraumatic, sclera non-icteric, no xanthomas, nares are without discharge.  Neck: Negative for carotid bruits. JVD not elevated. Lungs: Clear bilaterally to auscultation without wheezes, rales, or rhonchi. Breathing is unlabored. Heart: RRR with S1 S2. 3/6 SEM particularly at apex but also heard over precordium. No rubs or gallops appreciated. Abdomen: Soft, non-distended with normoactive bowel sounds. No hepatomegaly. No rebound/guarding. No obvious abdominal masses. Mild epigastric tenderness. Msk:  Strength and tone appear normal for age. Extremities: No clubbing or cyanosis. No edema.  Distal pedal pulses are 2+ and equal bilaterally. Neuro: Alert and oriented X 3. No focal deficit. No facial asymmetry. Moves all extremities spontaneously. Psych:  Responds to questions appropriately with a normal affect.    Assessment and Plan   1. Chest pain with mixed features - symptoms  dissimilar to prior angina, troponin negative thus far. Difficult to completely exclude cardiac etiology based on one troponin but seems pretty atypical for cardiac pain. Also associated with inability to get warm, several BM and excess urination which does not really suggest cardiac etiology. Will order f/u troponin and review with MD.  2. CAD s/p prior PCIs, CABG - as above. Continue present regimen.  3. Systolic murmur - 2d Echo 11/2017 (With murmur documented at that time) showed moderate MR. No sx of acute decompensation.  4. Frequent PVCs/bigeminy - not a new finding, seen on prior EKGs and admissions (including this summer in absence of acute cardiac issue). Mg OK. K 3.5, will give 25meq now. May need to titrate KCl to 21meq BID.   5. HTN - BP elevated in ED as well as recent office/hospital encounters. Consider titration of BB or amlodipine.  For questions or updates, please contact [place] Please consult [email or web address] for contact info under Cardiology/STEMI.  Signed, Charlie Pitter, PA-C 02/03/2018, 3:16 PM   Patient  seen and examined   I have reviewed and agree with findings as noted by D DUnn PA above  Pt is a 86 yo with hx of CAD (CABG in 2018), PVCs, common duct stone  Presents to ED with one day of CP    Woke from sleep with sharp pain in substernal area  (new for her, not pleuritic or position; not associated with weakness like previous angina; no SOB)   Eased of with NTG    Second episode lasted about 1 min   Third episode sharp   Lasted several min then eased with NTG   Fourth easedwith NTG   During this time pt had several large bowel movements She is currently asymptomatic  ON exam:   Neck:  JVP is normal   Lungs are CTA   Cardiac RRR   No murmurs   ABd is supple   Ext are without edema Tele shows SR with PVCs  She has been known to have PVCs in past Initial troponins are negative   Impression   Chest pain    Sharp   I am not convinced by history that this  represents angina   SUsp may be more GI in origin with possible common duct stone     I would continue to follow troponin   Keep on telemetry I would check amylase and lipase  Will follow along with medicine service   Consider GI input  Patient has been on long term plavix (will need to review this use post CABG)   This wlll need to be stopped before any UGI study is performed.  Dorris Carnes

## 2018-02-03 NOTE — ED Notes (Signed)
Patient transported to X-ray 

## 2018-02-03 NOTE — H&P (Signed)
History and Physical    AMBERLEE GARVEY NWG:[id number] DOB: March 07, 1930 DOA: 02/03/2018  PCP: Lajean Manes, MD Consultants:  cardiology Patient coming from: cardiology clinic  Chief Complaint: chest pain  HPI: [name] is a 85 y.o. female with medical history significant for CAD status post PCI and CABG November 2018, prior cardiac arrest, hypertension, hyperlipidemia, carotid disease and prior cholangitis/ choledocholithiasis status post cholecystectomy, status post biliary stent who presented to the ED today c/o chest pain.  She woke up this morning feeling a sharp pain in her central lower chest/upper epigastric area.  It lasted long enough that she took a nitroglycerin.  Several minutes later she felt better and went back to sleep but the chest pain recurred several times over the morning at random times at rest and with activity.  She took 2 more nitroglycerin and found some relief but the pain would recur.  Chest pain is centrally located, does not radiate, is not similar to her pain that she had with a prior MI which radiated to her back between her shoulder blades. She has had no leg swelling, no shortness of breath, no orthopnea or PND. Pain was initially a 10 out of 10 which dropped to 0 after she took aspirin and nitroglycerin.   As far as her GI issues, she had biliary stenting that was performed and recently she saw GI and discussed that multiple ERCPs may be required to completely clear the duct and this was planned for February 10 of this year.   ED Course: Patient was alert and oriented, vital signs were stable.  EKG showed new ventricular bigeminy, no STEMI, grossly similar to prior.  T bili was 1.8 otherwise labs were unremarkable she had a normal initial troponin.  Chest x-ray showed a small pleural effusion which was similar to prior.  Cardiology was consulted due to new ventricular bigeminy and chest pain.  Cardiology felt patient need to be admitted for ACS rule out.  Review  of Systems: Positive for feeling extremely cold but no overt chills, she has had several bowel movements but not diarrhea, excessive urination.  There was negative except per HPI  Ambulatory Status:  Ambulates without assistance  Past Medical History:  Diagnosis Date  . Anxiety   . Atypical carcinoid lung tumor ([place])   . CAD (coronary artery disease)    a. 1986: s/p PTCA of bifurcation lesion in LAD;  b. 2001: s/p BMS to LAD & LCX;  c. 04/2006 Cath: diff CAD w/o critical narrowing, nl EF;  d. 12/2014 MV: inflat ST dep, mild basal inf ischemia, EF 55-65%-->Low risk; e. 12/2016 - 3V CABG.  . Cancer ([place])   . Carotid disease, bilateral ([place])    a. 08/2015 Carotid U/S: < 39% bilat ICA stenosis.  . Cataracts, bilateral   . Cholangitis   . Choledocholithiasis   . Cholelithiases   . Chronic diarrhea   . Depression   . Frequent PVCs   . Gilbert's syndrome   . History of echocardiogram    a. 06/2010 Echo: EF 60-65%, mild AI/MR; b. 10/2015 Echo: EF 60-65%, no rwma, mild MR, nl RV fxn.  Marland Kitchen HTN (hypertension)   . Hyperlipidemia   . Impaired fasting blood sugar   . Keloid    mid sternum  . Macular degeneration   . Osteoarthritis   . Positional vertigo    benign  . Postoperative atrial fibrillation ([place]) 12/2016  . S/P CABG x 3 12/23/2016   LIMA to LAD, SVG to  OM, SVG to PDA, EVH via left thigh  . Symptoms, such as flushing, sleeplessness, headache, lack of concentration, associated with the menopause   . TIA (transient ischemic attack)     Past Surgical History:  Procedure Laterality Date  . ABDOMINAL AORTOGRAM N/A 12/22/2016   Procedure: ABDOMINAL AORTOGRAM;  Surgeon: Nelva Bush, MD;  Location: [facility];  Service: Cardiovascular;  Laterality: N/A;  . ABDOMINAL HYSTERECTOMY    . BILIARY STENT PLACEMENT  12/10/2017   Procedure: BILIARY STENT PLACEMENT;  Surgeon: Irving Copas., MD;  Location: [facility];  Service: Gastroenterology;;  . CHOLECYSTECTOMY    .  CORONARY ARTERY BYPASS GRAFT N/A 12/23/2016   Procedure: CORONARY ARTERY BYPASS GRAFTING (CABG) TIMES THREE USING LEFT INTERNAL MAMMARY ARTERY AND LEFT SAPHENOUS LEG VEIN HARVESTED ENDOSCOPICALLY,;  Surgeon: Rexene Alberts, MD;  Location: [facility];  Service: Open Heart Surgery;  Laterality: N/A;  . ERCP N/A 12/10/2017   Procedure: ENDOSCOPIC RETROGRADE CHOLANGIOPANCREATOGRAPHY (ERCP);  Surgeon: Irving Copas., MD;  Location: [facility];  Service: Gastroenterology;  Laterality: N/A;  . IABP INSERTION N/A 12/22/2016   Procedure: IABP Insertion;  Surgeon: Nelva Bush, MD;  Location: [facility];  Service: Cardiovascular;  Laterality: N/A;  . LEFT HEART CATH AND CORONARY ANGIOGRAPHY N/A 12/22/2016   Procedure: LEFT HEART CATH AND CORONARY ANGIOGRAPHY;  Surgeon: Nelva Bush, MD;  Location: [facility];  Service: Cardiovascular;  Laterality: N/A;  . LEFT HEART CATHETERIZATION WITH CORONARY ANGIOGRAM N/A 04/27/2012   Procedure: LEFT HEART CATHETERIZATION WITH CORONARY ANGIOGRAM;  Surgeon: Peter M Martinique, MD;  Location: [facility];  Service: Cardiovascular;  Laterality: N/A;  . LOBECTOMY Right    RLL atypical carcinoid tumor - Dr Arlyce Dice  . pci  1986  . PCI of LAD  2001   with non des lad   . pci of mid cfx     with non des and cutting balloon pci of av circumflex  . TEE WITHOUT CARDIOVERSION N/A 12/23/2016   Procedure: TRANSESOPHAGEAL ECHOCARDIOGRAM (TEE);  Surgeon: Rexene Alberts, MD;  Location: [facility];  Service: Open Heart Surgery;  Laterality: N/A;    Social History   Socioeconomic History  . Marital status: Divorced    Spouse name: Not on file  . Number of children: Not on file  . Years of education: Not on file  . Highest education level: Not on file  Occupational History  . Occupation: retired  Scientific laboratory technician  . Financial resource strain: Not on file  . Food insecurity:    Worry: Not on file    Inability: Not on file  . Transportation needs:     Medical: Not on file    Non-medical: Not on file  Tobacco Use  . Smoking status: Never Smoker  . Smokeless tobacco: Never Used  Substance and Sexual Activity  . Alcohol use: No  . Drug use: No  . Sexual activity: Not on file  Lifestyle  . Physical activity:    Days per week: Not on file    Minutes per session: Not on file  . Stress: Not on file  Relationships  . Social connections:    Talks on phone: Not on file    Gets together: Not on file    Attends religious service: Not on file    Active member of club or organization: Not on file    Attends meetings of clubs or organizations: Not on file    Relationship status: Not on file  .  Intimate partner violence:    Fear of current or ex partner: Not on file    Emotionally abused: Not on file    Physically abused: Not on file    Forced sexual activity: Not on file  Other Topics Concern  . Not on file  Social History Narrative  . Not on file    Allergies  Allergen Reactions  . Sertraline Hcl Nausea Only  . Biaxin [Clarithromycin]     depressed  . Celecoxib Other (See Comments)    Reaction: Unknown  . Ciprofloxacin   . Codeine Other (See Comments)    Reaction: Unknown  . Hydromorphone   . Lisinopril     cough  . Macrobid [employer] Macro] Other (See Comments)    Reaction: Unknown  . Oxycodone Hcl Other (See Comments)    Reaction: Unknown  . Prednisone   . Sertraline Hcl Other (See Comments)    Reaction: Unknown  . Penicillins Rash    Reaction: Unknown    Family History  Problem Relation Age of Onset  . Diabetes Mellitus I Mother   . CAD Mother   . CAD Father   . Diabetes Mellitus I Sister   . Diabetes Mellitus I Brother   . Diabetes Mellitus I Sister   . Colon cancer Neg Hx   . Esophageal cancer Neg Hx   . Inflammatory bowel disease Neg Hx   . Liver disease Neg Hx   . Pancreatic cancer Neg Hx   . Rectal cancer Neg Hx   . Stomach cancer Neg Hx     Prior to Admission medications     Medication Sig Start Date End Date Taking? Authorizing Provider  acetaminophen (TYLENOL) 325 MG tablet Take 2 tablets (650 mg total) by mouth every 6 (six) hours as needed for mild pain or headache. 01/02/17  Yes Barrett, Erin R, PA-C  amLODipine (NORVASC) 10 MG tablet Take 1 tablet (10 mg total) by mouth daily. Patient taking differently: Take 5 mg by mouth daily.  01/02/17  Yes Barrett, Erin R, PA-C  aspirin 81 MG tablet Take 81 mg by mouth daily.   Yes [provider]  atorvastatin (LIPITOR) 80 MG tablet Take 1 tablet (80 mg total) by mouth daily. 01/02/17  Yes Barrett, Erin R, PA-C  calcium carbonate (TUMS EX) 750 MG chewable tablet Chew 2 tablets by mouth as needed for heartburn.   Yes [provider]  Calcium Carbonate-Vitamin D (CALCIUM 600 + D PO) Take 1 tablet by mouth daily. 600 mg/ 400 mg   Yes [provider]  clopidogrel (PLAVIX) 75 MG tablet Take 1 tablet (75 mg total) by mouth daily. 01/02/17  Yes Barrett, Erin R, PA-C  estrogens, conjugated, (PREMARIN) 0.3 MG tablet Take 0.3 mg by mouth daily. Take daily for 21 days then do not take for 7 days.   Yes [provider]  furosemide (LASIX) 20 MG tablet Take 40 mg by mouth daily.    Yes [provider]  IRON PO Take 65 mg by mouth.   Yes [provider]  KLOR-CON M20 20 MEQ tablet TAKE 1 TABLET (20 MEQ TOTAL) BY MOUTH 2 (TWO) TIMES DAILY. Patient taking differently: Take 20 mEq by mouth 2 (two) times daily.  11/01/17  Yes Gollan, Kathlene November, MD  LORazepam (ATIVAN) 0.5 MG tablet Take 1 tablet (0.5 mg total) by mouth every 8 (eight) hours as needed for anxiety. 01/02/17  Yes Barrett, Erin R, PA-C  losartan (COZAAR) 100 MG tablet Take  0.5 tablets (50 mg total) by mouth 2 (two) times daily. 08/03/17  Yes Minna Merritts, MD  metoprolol succinate (TOPROL-XL) 50 MG 24 hr tablet Take 50 mg by mouth daily.  07/31/17  Yes [provider]  Multiple Vitamin (MULTIVITAMIN) tablet Take 1  tablet by mouth daily.   Yes [provider]  nitroGLYCERIN (NITROSTAT) 0.4 MG SL tablet PLACE 1 TABLET (0.4 MG TOTAL) UNDER THE TONGUE EVERY 5 (FIVE) MINUTES AS NEEDED FOR CHEST PAIN. 01/04/17  Yes End, Harrell Gave, MD  ondansetron (ZOFRAN) 4 MG tablet Take 1 tablet (4 mg total) by mouth every 8 (eight) hours as needed for nausea or vomiting. 12/12/17  Yes Thurnell Lose, MD  pantoprazole (PROTONIX) 40 MG tablet Take 40 mg by mouth daily. 12/20/17  Yes [provider]    Physical Exam: Vitals:   02/03/18 1308 02/03/18 1330 02/03/18 1530 02/03/18 1545  BP:  (!) 168/66 (!) 156/57 (!) 152/66  Pulse: 79 (!) 45 (!) 30 (!) 31  Resp: 15 13 15 18   Temp: (!) 97.2 F (36.2 C)     TempSrc: Oral     SpO2: 100% 100% 98% 100%  Weight:      Height:         . General: Appears calm and comfortable and is in NAD . Eyes:  PERRL, EOMI, normal lids, iris . ENT:  grossly normal hearing, lips & tongue, mmm . Neck:  supple, no lymphadenopathy . Cardiovascular:  nL S1, S2, normal rate, reg rhythm. III/VI systolic M, frequent ectopy . Respiratory:   CTA bilaterally with no wheezes/rales/rhonchi.  Normal respiratory effort. . Abdomen:  soft, NT, ND, NABS . Back:   grossly normal alignment . Skin:  no rash or lesions seen on limited exam . Musculoskeletal:  grossly normal tone BUE/BLE, good ROM, no bony abnormality or obvious joint deformity . Lower extremities:  Trace BLE edema.  Limited foot exam with no ulcerations.  2+ distal pulses. Marland Kitchen Psychiatric:  grossly normal mood and affect, speech fluent and appropriate, AOx3 . Neurologic:  CN 2-12 grossly intact, moves all extremities in coordinated fashion, sensation intact, Patellar DTRs 2+ and symmetric    Radiological Exams on Admission: Dg Chest 2 View  Result Date: 02/03/2018 CLINICAL DATA:  Chest pain for 1 day. EXAM: CHEST - 2 VIEW COMPARISON:  PA and lateral chest 01/19/2018 and 02/04/2017. FINDINGS: Lungs are clear. Small right  pleural effusion is unchanged since the most recent exam. No left effusion. No pneumothorax. Heart size is normal. Aortic atherosclerosis noted. IMPRESSION: Small right pleural effusion, unchanged since the most recent exam. Atherosclerosis. Electronically Signed   By: Inge Rise M.D.   On: 02/03/2018 14:15    EKG: Independently reviewed.  Date/Time:                  Thursday February 03 2018 13:11:45 EST Ventricular Rate:         72 PR Interval:                   QRS Duration: 87 QT Interval:                 426 QTC Calculation:        404 R Axis:                         68 Text Interpretation:       Sinus rhythm Ventricular bigeminy Left atrial enlargement Borderline ST depression,  lateral leads when compared to prior, new Bigeminy,  No STEMI    Labs on Admission: I have personally reviewed the available labs and imaging studies at the time of the admission.  Pertinent labs:  CMP normal except for a glucose of 170.  Creatinine 0.8 which is her baseline Magnesium 2.0, potassium 3.5 Direct bilirubin 0.3, indirect bilirubin 1.5, total bilirubin 1.8 Troponin I, initial 0.00 CBC unremarkable Urinalysis unremarkable   Assessment/Plan Principal Problem:   Chest pain Active Problems:   HYPERCHOLESTEROLEMIA  IIA   Essential hypertension   Coronary atherosclerosis   PVC's (premature ventricular contractions)   Choledocholithiasis   Chest pain: Patient with known coronary artery disease but with atypical story/symptoms.  Cardiology has been consulted and is on board.  They would like patient to be admitted to observation for ischemic rule out. -Cycle troponins, EKG in the morning -Continue CAD meds: Aspirin, Plavix, statin, beta-blocker, ARB -Appreciate cardiology input -Check hemoglobin A1c, fasting lipid panel in the morning  HTN -cont home BP meds: amlodipine, lasix, cozaar, metoprolol  PVCs, ventricular bigeminy -maintain K >4 and Mg >2 -was given KCl already  today  Cholelithiasis/choledocholithiasis: Continue follow-up as outpatient.  Plan is for multiple ERCPs in February to attempt to clear her duct.  Follow-up CMP in the morning and trend bilirubin.   DVT prophylaxis: lovenox Code Status:  Full - confirmed with patient/family Family Communication: none  Disposition Plan:  Home once clinically improved; lives with son and his family Consults called: cardiology  Admission status: It is my clinical opinion that referral for OBSERVATION is reasonable and necessary in this patient based on the above information provided. The aforementioned taken together are felt to place the patient at high risk for further clinical deterioration. However it is anticipated that the patient may be medically stable for discharge from the hospital within 24 to 48 hours.     Janora Norlander MD [facility]  If note is complete, please contact covering daytime or nighttime physician. [email or web address] Password [facility]  02/03/2018, 5:23 PM

## 2018-02-04 ENCOUNTER — Telehealth: Payer: Self-pay

## 2018-02-04 ENCOUNTER — Encounter (HOSPITAL_COMMUNITY): Payer: Self-pay | Admitting: Student

## 2018-02-04 DIAGNOSIS — I10 Essential (primary) hypertension: Secondary | ICD-10-CM

## 2018-02-04 DIAGNOSIS — I259 Chronic ischemic heart disease, unspecified: Secondary | ICD-10-CM | POA: Diagnosis not present

## 2018-02-04 DIAGNOSIS — Z9889 Other specified postprocedural states: Secondary | ICD-10-CM

## 2018-02-04 DIAGNOSIS — K8050 Calculus of bile duct without cholangitis or cholecystitis without obstruction: Secondary | ICD-10-CM | POA: Diagnosis not present

## 2018-02-04 LAB — COMPREHENSIVE METABOLIC PANEL
ALT: 18 U/L (ref 0–44)
AST: 27 U/L (ref 15–41)
Albumin: 3.3 g/dL — ABNORMAL LOW (ref 3.5–5.0)
Alkaline Phosphatase: 70 U/L (ref 38–126)
Anion gap: 9 (ref 5–15)
BUN: 16 mg/dL (ref 8–23)
CO2: 26 mmol/L (ref 22–32)
Calcium: 9.2 mg/dL (ref 8.9–10.3)
Chloride: 105 mmol/L (ref 98–111)
Creatinine, Ser: 0.86 mg/dL (ref 0.44–1.00)
GFR calc Af Amer: >60 mL/min (ref >60)
GFR calc non Af Amer: >60 mL/min (ref >60)
Glucose, Bld: 103 mg/dL — ABNORMAL HIGH (ref 70–99)
Potassium: 3.5 mmol/L (ref 3.5–5.1)
Sodium: 140 mmol/L (ref 135–145)
Total Bilirubin: 2.2 mg/dL — ABNORMAL HIGH (ref 0.3–1.2)
Total Protein: 5.9 g/dL — ABNORMAL LOW (ref 6.5–8.1)

## 2018-02-04 LAB — LIPID PANEL
Cholesterol: 121 mg/dL (ref 0–200)
HDL: 47 mg/dL (ref >40)
LDL Cholesterol: 41 mg/dL (ref 0–99)
Total CHOL/HDL Ratio: 2.6 RATIO
Triglycerides: 167 mg/dL — ABNORMAL HIGH (ref <150)
VLDL: 33 mg/dL (ref 0–40)

## 2018-02-04 LAB — URINE CULTURE: Culture: NO GROWTH

## 2018-02-04 LAB — HEMOGLOBIN A1C
Hgb A1c MFr Bld: 5.6 % (ref 4.8–5.6)
Mean Plasma Glucose: 114.02 mg/dL

## 2018-02-04 LAB — BILIRUBIN, DIRECT: Bilirubin, Direct: 0.4 mg/dL — ABNORMAL HIGH (ref 0.0–0.2)

## 2018-02-04 MED ORDER — AMLODIPINE BESYLATE 5 MG PO TABS: 5.0000 mg | ORAL_TABLET | Freq: Two times a day (BID) | ORAL | Status: DC

## 2018-02-04 NOTE — Progress Notes (Signed)
Pt discharge education and instructions completed with pt; pt voices understanding and denies any questions. Pt IV and telemetry removed; pt discharge home with grandson to transport her home. Pt transported off unit via wheelchair with belongings and grandson to the side. Delia Heady RN

## 2018-02-04 NOTE — Discharge Summary (Signed)
Physician Discharge Summary  [name] CWU:[id number] DOB: December 08, 1930 DOA: 02/03/2018  PCP: Lajean Manes, MD  Admit date: 02/03/2018 Discharge date: 02/04/2018  Admitted From: home Discharge disposition: home   Recommendations for Outpatient Follow-Up:   1. Close follow up with Dr. Rush Landmark   Discharge Diagnosis:   Principal Problem:   Chest pain Active Problems:   HYPERCHOLESTEROLEMIA  IIA   Essential hypertension   Coronary atherosclerosis   PVC's (premature ventricular contractions)   Choledocholithiasis    Discharge Condition: Improved.  Diet recommendation: Low sodium, heart healthy  Wound care: None.  Code status: Full.   History of Present Illness:   [name] is a 85 y.o. female with medical history significant for CAD status post PCI and CABG November 2018, prior cardiac arrest, hypertension, hyperlipidemia, carotid disease and prior cholangitis/ choledocholithiasis status post cholecystectomy, status post biliary stent who presented to the ED today c/o chest pain.  She woke up this morning feeling a sharp pain in her central lower chest/upper epigastric area.  It lasted long enough that she took a nitroglycerin.  Several minutes later she felt better and went back to sleep but the chest pain recurred several times over the morning at random times at rest and with activity.  She took 2 more nitroglycerin and found some relief but the pain would recur.  Chest pain is centrally located, does not radiate, is not similar to her pain that she had with a prior MI which radiated to her back between her shoulder blades. She has had no leg swelling, no shortness of breath, no orthopnea or PND. Pain was initially a 10 out of 10 which dropped to 0 after she took aspirin and nitroglycerin.    Hospital Course by Problem:    Chest Pain - seen by cardiology and thought not to be cardiac -working on better BP control -GI notified of plavix stopping and she  will need close follow up with GI-- already scheduled for ERCP. -patient feeling much better and anxious to go home  CAD s/p CABG in 2018 - Currently on dual antiplatelet therapy with Aspirin and Plavix  She has been this since 2014    Started after PCIs at that time  Per cards: She is now 12 months post CABG (had NSTEMI prior )   OK to d/c Plavix   COntinue on ASA    Frequent PVCs/Bigeminy -on BB -K goal of 4 and Mg of 2  Hypertension -resume home meds - Currently has Amlodipine 5mg  daily, Losartan 50mg  twice daily, and Toprol-XL 50mg  daily.  Hyperlipidemia - LDL at goal of <70 given CAD. - Continue high-intensity statin.    Medical Consultants:   cards   Discharge Exam:   Vitals:   02/04/18 0820 02/04/18 1325  BP: (!) 189/76 (!) 149/64  Pulse: 94 66  Resp:  18  Temp:  98.8 F (37.1 C)  SpO2:  96%   Vitals:   02/04/18 0506 02/04/18 0700 02/04/18 0820 02/04/18 1325  BP: (!) 165/70 (!) 191/80 (!) 189/76 (!) 149/64  Pulse: 72 94 94 66  Resp: 18 18  18   Temp: 98.1 F (36.7 C) 98 F (36.7 C)  98.8 F (37.1 C)  TempSrc: Oral Oral  Oral  SpO2: 96% 98%  96%  Weight: 81 kg     Height:        General exam: Appears calm and comfortable. No further pain after eating   The results of significant diagnostics  from this hospitalization (including imaging, microbiology, ancillary and laboratory) are listed below for reference.     Procedures and Diagnostic Studies:   Dg Chest 2 View  Result Date: 02/03/2018 CLINICAL DATA:  Chest pain for 1 day. EXAM: CHEST - 2 VIEW COMPARISON:  PA and lateral chest 01/19/2018 and 02/04/2017. FINDINGS: Lungs are clear. Small right pleural effusion is unchanged since the most recent exam. No left effusion. No pneumothorax. Heart size is normal. Aortic atherosclerosis noted. IMPRESSION: Small right pleural effusion, unchanged since the most recent exam. Atherosclerosis. Electronically Signed   By: Inge Rise M.D.   On: 02/03/2018  14:15     Labs:   Basic Metabolic Panel: Recent Labs  Lab 02/03/18 1311 02/03/18 1319 02/04/18 0537  NA  --  139 140  K  --  3.5 3.5  CL  --  103 105  CO2  --  28 26  GLUCOSE  --  170* 103*  BUN  --  15 16  CREATININE  --  0.80 0.86  CALCIUM  --  9.4 9.2  MG 2.0  --   --    GFR Estimated Creatinine Clearance: 51 mL/min (by C-G formula based on SCr of 0.86 mg/dL). Liver Function Tests: Recent Labs  Lab 02/03/18 1311 02/04/18 0537  AST 32 27  ALT 22 18  ALKPHOS 89 70  BILITOT 1.8* 2.2*  PROT 7.0 5.9*  ALBUMIN 3.8 3.3*   Recent Labs  Lab 02/03/18 1311  LIPASE 39   No results for input(s): AMMONIA in the last 168 hours. Coagulation profile No results for input(s): INR, PROTIME in the last 168 hours.  CBC: Recent Labs  Lab 02/03/18 1319  WBC 4.5  HGB 12.9  HCT 39.6  MCV 91.9  PLT 165   Cardiac Enzymes: Recent Labs  Lab 02/03/18 1729  TROPONINI <0.03   BNP: Invalid input(s): POCBNP CBG: No results for input(s): GLUCAP in the last 168 hours. D-Dimer No results for input(s): DDIMER in the last 72 hours. Hgb A1c Recent Labs    02/04/18 0537  HGBA1C 5.6   Lipid Profile Recent Labs    02/04/18 0537  CHOL 121  HDL 47  LDLCALC 41  TRIG 167*  CHOLHDL 2.6   Thyroid function studies No results for input(s): TSH, T4TOTAL, T3FREE, THYROIDAB in the last 72 hours.  Invalid input(s): FREET3 Anemia work up No results for input(s): VITAMINB12, FOLATE, FERRITIN, TIBC, IRON, RETICCTPCT in the last 72 hours. Microbiology Recent Results (from the past 240 hour(s))  Urine culture     Status: None   Collection Time: 02/03/18  1:50 PM  Result Value Ref Range Status   Specimen Description URINE, RANDOM  Final   Special Requests NONE  Final   Culture   Final    NO GROWTH Performed at [facility], [address], [place], [place] [address]    Report Status 02/04/2018 FINAL  Final     Discharge Instructions:   Discharge Instructions     Diet - low sodium heart healthy   Complete by:  As directed    Discharge instructions   Complete by:  As directed    Dr. Rush Landmark has been notified of your symptoms - your ERCP is scheduled on 2-10   Increase activity slowly   Complete by:  As directed      Allergies as of 02/04/2018      Reactions   Sertraline Hcl Nausea Only   Biaxin [clarithromycin]    depressed  Celecoxib Other (See Comments)   Reaction: Unknown   Ciprofloxacin    Codeine Other (See Comments)   Reaction: Unknown   Hydromorphone    Lisinopril    cough   Macrobid [nitrofurantoin Monohyd Macro] Other (See Comments)   Reaction: Unknown   Oxycodone Hcl Other (See Comments)   Reaction: Unknown   Prednisone    Sertraline Hcl Other (See Comments)   Reaction: Unknown   Penicillins Rash   Reaction: Unknown      Medication List    STOP taking these medications   clopidogrel 75 MG tablet Commonly known as:  PLAVIX     TAKE these medications   acetaminophen 325 MG tablet Commonly known as:  TYLENOL Take 2 tablets (650 mg total) by mouth every 6 (six) hours as needed for mild pain or headache.   amLODipine 10 MG tablet Commonly known as:  NORVASC Take 1 tablet (10 mg total) by mouth daily. What changed:  how much to take   aspirin 81 MG tablet Take 81 mg by mouth daily.   atorvastatin 80 MG tablet Commonly known as:  LIPITOR Take 1 tablet (80 mg total) by mouth daily.   CALCIUM 600 + D PO Take 1 tablet by mouth daily. 600 mg/ 400 mg   calcium carbonate 750 MG chewable tablet Commonly known as:  TUMS EX Chew 2 tablets by mouth as needed for heartburn.   estrogens (conjugated) 0.3 MG tablet Commonly known as:  PREMARIN Take 0.3 mg by mouth daily. Take daily for 21 days then do not take for 7 days.   furosemide 20 MG tablet Commonly known as:  LASIX Take 40 mg by mouth daily.   IRON PO Take 65 mg by mouth.   KLOR-CON M20 20 MEQ tablet Generic drug:  potassium chloride SA TAKE 1  TABLET (20 MEQ TOTAL) BY MOUTH 2 (TWO) TIMES DAILY. What changed:  See the new instructions.   LORazepam 0.5 MG tablet Commonly known as:  ATIVAN Take 1 tablet (0.5 mg total) by mouth every 8 (eight) hours as needed for anxiety.   losartan 100 MG tablet Commonly known as:  COZAAR Take 0.5 tablets (50 mg total) by mouth 2 (two) times daily.   metoprolol succinate 50 MG 24 hr tablet Commonly known as:  TOPROL-XL Take 50 mg by mouth daily.   multivitamin tablet Take 1 tablet by mouth daily.   nitroGLYCERIN 0.4 MG SL tablet Commonly known as:  NITROSTAT PLACE 1 TABLET (0.4 MG TOTAL) UNDER THE TONGUE EVERY 5 (FIVE) MINUTES AS NEEDED FOR CHEST PAIN.   ondansetron 4 MG tablet Commonly known as:  ZOFRAN Take 1 tablet (4 mg total) by mouth every 8 (eight) hours as needed for nausea or vomiting.   pantoprazole 40 MG tablet Commonly known as:  PROTONIX Take 40 mg by mouth daily.      Follow-up Information    [facility] Follow up.   Specialty:  Cardiology Why:  You have a follow-up visit scheduled for 02/28/2018 at 9:00am with Christell Faith, PA-C. Contact information: [address], [place] [phone]           Time coordinating discharge: 25 min  Signed:  Geradine Girt DO  [facility] 02/05/2018, 4:41 PM

## 2018-02-04 NOTE — Progress Notes (Signed)
Patient feeling better and would like to go home.  Have spoke with GI who will get patient a close follow up to see if procedure can be moved up now that she is off plavix. Eulogio Bear DO

## 2018-02-04 NOTE — Progress Notes (Addendum)
Progress Note  Patient Name: [name] Date of Encounter: 02/04/2018  Primary Cardiologist: Ida Rogue, MD   Subjective   No significant overnight events. No chest pain or shortness of breath. No abdominal pain.  She did have sl discomfort after eating in epigastric area   Inpatient Medications    Scheduled Meds: . amLODipine  5 mg Oral Daily  . aspirin EC  81 mg Oral Daily  . atorvastatin  80 mg Oral Daily  . calcium-vitamin D  1 tablet Oral q morning - 10a  . clopidogrel  75 mg Oral Daily  . enoxaparin (LOVENOX) injection  40 mg Subcutaneous Q24H  . estrogens (conjugated)  0.3 mg Oral Daily  . ferrous sulfate  325 mg Oral Daily  . furosemide  40 mg Oral Daily  . losartan  50 mg Oral BID  . metoprolol succinate  50 mg Oral Daily  . multivitamin with minerals  1 tablet Oral Daily  . pantoprazole  40 mg Oral Daily  . potassium chloride SA  20 mEq Oral BID  . potassium chloride  40 mEq Oral Once   Continuous Infusions:  PRN Meds: acetaminophen, calcium carbonate, LORazepam, nitroGLYCERIN, ondansetron (ZOFRAN) IV, ondansetron   Vital Signs    Vitals:   02/03/18 1715 02/03/18 1821 02/03/18 2222 02/04/18 0506  BP: (!) 148/65 (!) 161/64 (!) 177/66 (!) 165/70  Pulse: (!) 32 74 66 72  Resp: 16 17  18   Temp:  97.9 F (36.6 C)  98.1 F (36.7 C)  TempSrc:  Oral  Oral  SpO2: 98% 98% 97% 96%  Weight:    81 kg  Height:        Intake/Output Summary (Last 24 hours) at 02/04/2018 0728 Last data filed at 02/04/2018 0600 Gross per 24 hour  Intake 240 ml  Output 0 ml  Net 240 ml   Last 3 Weights 02/04/2018 02/03/2018 01/19/2018  Weight (lbs) 178 lb 9.2 oz 175 lb 177 lb  Weight (kg) 81 kg 79.379 kg 80.287 kg      Telemetry    Sinus rhythm with heart rates in the 60's to 70's and frequent PVCs often in bigeminy pattern. - Personally Reviewed  ECG    Normal sinus rhythm, rate 67, with bigeminy PVCs but no acute ischemic changes compared to prior tracing. - Personally  Reviewed  Physical Exam   GEN: Elderly Caucasian female sitting comfortably in hospital bed. Alert and in no acute distress.   Neck: Supple. Cardiac: RRR with some premature beats. Systolic murmur noted. No rubs or gallops appreciated.  Respiratory: Clear to auscultation bilaterally. GI: Abdomen soft, non-tender, non-distended. Bowel sounds present. MS: No lower extremity edema. No deformity. Neuro:  No focal deficits. Psych: Normal affect. Responds appropriately.  Labs    Chemistry Recent Labs  Lab 02/03/18 1311 02/03/18 1319 02/04/18 0537  NA  --  139 140  K  --  3.5 3.5  CL  --  103 105  CO2  --  28 26  GLUCOSE  --  170* 103*  BUN  --  15 16  CREATININE  --  0.80 0.86  CALCIUM  --  9.4 9.2  PROT 7.0  --  5.9*  ALBUMIN 3.8  --  3.3*  AST 32  --  27  ALT 22  --  18  ALKPHOS 89  --  70  BILITOT 1.8*  --  2.2*  GFRNONAA  --  >60 >60  GFRAA  --  >60 >60  ANIONGAP  --  8 9     Hematology Recent Labs  Lab 02/03/18 1319  WBC 4.5  RBC 4.31  HGB 12.9  HCT 39.6  MCV 91.9  MCH 29.9  MCHC 32.6  RDW 13.1  PLT 165    Cardiac Enzymes Recent Labs  Lab 02/03/18 1729  TROPONINI <0.03    Recent Labs  Lab 02/03/18 1327  TROPIPOC 0.00     BNPNo results for input(s): BNP, PROBNP in the last 168 hours.   DDimer No results for input(s): DDIMER in the last 168 hours.   Radiology    Dg Chest 2 View  Result Date: 02/03/2018 CLINICAL DATA:  Chest pain for 1 day. EXAM: CHEST - 2 VIEW COMPARISON:  PA and lateral chest 01/19/2018 and 02/04/2017. FINDINGS: Lungs are clear. Small right pleural effusion is unchanged since the most recent exam. No left effusion. No pneumothorax. Heart size is normal. Aortic atherosclerosis noted. IMPRESSION: Small right pleural effusion, unchanged since the most recent exam. Atherosclerosis. Electronically Signed   By: Inge Rise M.D.   On: 02/03/2018 14:15    Cardiac Studies   Echocardiogram 12/10/2017: Study Conclusions: -  Left ventricle: The cavity size was normal. Wall thickness was   normal. Systolic function was normal. The estimated ejection   fraction was in the range of 55% to 60%. Wall motion was normal;   there were no regional wall motion abnormalities. Doppler   parameters are consistent with abnormal left ventricular   relaxation (grade 1 diastolic dysfunction). The E/e&' ratio is   >15, suggesting elevated LV filling pressure. - Mitral valve: Mildly thickened leaflets . There was moderate   regurgitation. - Left atrium: The atrium was normal in size. - Tricuspid valve: There was mild regurgitation. - Pulmonary arteries: PA peak pressure: 52 mm Hg (S). - Inferior vena cava: The vessel was dilated. The respirophasic   diameter changes were blunted (< 50%), consistent with elevated   central venous pressure.  Impressions: - Compared to a prior study in 2018, the LVEF is now 55-60% without   gross regional wall motion abnormalities. Moderate eccentric MR.   There is diastolic dysfunction with elevated LV filling pressure   and moderate pulmonary hypertension.  Patient Profile   [name] is a 85 y.o. female with a history of CAD s/p PCIs and CABG in 11/2016, carotid artery disease, frequent PVCs and prior bigeminy, hypertension, TIA, and prior cholecystectomy with ongoing management of choledocholithiasis/cholangitis who is being seen for chest pain.   Assessment & Plan     Chest Pain - Patient presented with intermittent episodes of sharp substernal chest pain with chills, multiple bowel movements, and excess urination. - EKG shows no acute ischemic changes. - I-stat troponin negative in the ED. Repeat troponin also negative.  -  Does not appear to be angina   Lean more to GI esp with some discomfort after eating     CAD s/p CABG in 2018 - Currently on dual antiplatelet therapy with Aspirin and Plavix  She has been this since 2014    Started after PCIs at that time  She is now 12 months  post CABG (had NSTEMI prior )   OK to d/c Plavix   COntinue on ASA   - Continue secondary prevention with beta-blocker, statin, and ARB.  Frequent PVCs/Bigeminy - Continue to have frequent PVCs often in bigeminy pattern on EKG and telemetry. Not a new finding. Seen on previous EKGs and admissions. - Potassium 3.5 today. Goal >  4.0. Currently has K-Dur 8mEq twice daily ordered. - Magnesium 2.0 yesterday. At goal. Will be followed as outpt  Continue b blockade     Hypertension - BP very elevated this morning at 191/80.  - Currently has Amlodipine 5mg  daily, Losartan 50mg  twice daily, and Toprol-XL 50mg  daily. - Will go ahead and ask RN to give morning medications.  Hyperlipidemia - Lipid panel this admission: Cholesterol 121, Triglycerides, HDL 47, LDL 41. - LDL at goal of <70 given CAD. - Continue high-intensity statin.  Otherwise, per primary team.   For questions or updates, please contact [place] Please consult [email or web address] for contact info under        Signed, Darreld Mclean, PA-C  02/04/2018, 7:28 AM    Pt seen and examined  I have amended note above by Virgie Dad above  PT feeling better   Had slight epigastric discomfort after eating   R/O for MI      On exam, BP elevated this am before meds given Lungs are CTA Cardiac RRR   No S3    Ext are without edema  CP   I do not think that it represents angina  COncern for GI From a cardiac standpoint she has been on Plavix for years, now 13 months post CABG    OK to d/c   Risks outweigh benefits at this pt    (I do not see that it was prescribed for neurologic reasons in psat)  Being off of plavix will then not delay any GI evaluation    I would let GI know about pts presentation    She is scheduled for procedure at end of Feb but this may need to be moved up if she is having symtpoms  HTN   BP has been elevated     Follow   Would increase  amlodipine to 5 mg bid    Follow response as outpt   Patient should take  BP at home and take log and BP cuff to clinic  Will sign off    I will make sure pt has appt in cardiology in our [place] office.  Dorris Carnes

## 2018-02-04 NOTE — Telephone Encounter (Signed)
-----   Message from Irving Copas., MD sent at 02/04/2018 12:39 PM EST ----- Regarding: RE: can she have sooner ERCP? Sarah, I don't have any availability for this procedure any earlier at this time. If she gets fevers and chills then I'll work on trying to get her done sooner, but I don't have availability at this time. If she comes into the hospital again with issues then I'll change out my schedule as necessary. With the rest of the liver tests being normal, hard to believe that this is from her stent, seeing as her Tb never completely normalized. Do we have any imaging of the stent being out of position or pneumobilia being seen. Zian Delair, please have patient come in next week for repeat HFP to see what has happened to her liver tests and also do a direct bilirubin. If she does not get a KUB before she leaves, please order this for next week as well. Thanks. GM ----- Message ----- From: Clearence Cheek Sent: 02/04/2018  10:40 AM EST To: Irving Copas., MD Subject: can she have sooner ERCP?                      Hospitalist called to tell me pt is now off Plavix permanently. Admitted overnight with some lower sternal/chest pain that is resolved.  T bil bumped to 2.2 but LFTs otherwise normal.  Has not required repeat CT etc. Cardiology cleared her and thinks GI more at play. Pt and hopitalist wondering if you would do ERCP sooner than Feb/march that was plan, now that she is not on Plavix?    She saw you on 1/8, has ERCP set for 2/10.  Let her know if you want or can do procedure sooner. Hospitalist is discharging her home today.    Thanks.  S

## 2018-02-04 NOTE — Telephone Encounter (Signed)
The patient has been notified of this information and all questions answered.   Order for lab and xray have been entered.   The pt has been advised of the information and verbalized understanding.

## 2018-02-04 NOTE — Progress Notes (Signed)
Pt BP= 189/76. MD verbal order to give pt's scheduled BP meds at this time.

## 2018-02-07 ENCOUNTER — Ambulatory Visit (INDEPENDENT_AMBULATORY_CARE_PROVIDER_SITE_OTHER): Payer: BLUE CROSS/BLUE SHIELD | Admitting: Thoracic Surgery (Cardiothoracic Vascular Surgery)

## 2018-02-07 ENCOUNTER — Encounter: Payer: Self-pay | Admitting: Thoracic Surgery (Cardiothoracic Vascular Surgery)

## 2018-02-07 ENCOUNTER — Other Ambulatory Visit: Payer: Self-pay

## 2018-02-07 VITALS — BP 98/62 | HR 81 | Resp 18 | Ht 65.75 in | Wt 175.8 lb

## 2018-02-07 DIAGNOSIS — Z951 Presence of aortocoronary bypass graft: Secondary | ICD-10-CM

## 2018-02-07 NOTE — Patient Instructions (Signed)
Continue all previous medications without any changes at this time  Make every effort to stay physically active, get some type of exercise on a regular basis, and stick to a "heart healthy diet".  The long term benefits for regular exercise and a healthy diet are critically important to your overall health and wellbeing.  

## 2018-02-07 NOTE — Progress Notes (Signed)
[address]       [place] [address]             [phone]     CARDIOTHORACIC SURGERY OFFICE NOTE  Referring Provider is End, Harrell Gave, MD  Primary Cardiologist is Rockey Situ, Kathlene November, MD PCP is Lajean Manes, MD   HPI:  Patient is an 85 year old female with known history of coronary artery disease status post multiple previous coronary interventions in the past, hypertension, hyperlipidemia, previous TIA, chronic diarrhea, depression, anxiety, and previous right lower lobectomy for atypical carcinoid tumor of the lung who returns to the office today for routine follow-up status post emergent coronary artery bypass grafting x3 for critical left main and three-vessel coronary artery disease status post acute non-ST segment elevation myocardial infarction on December 23, 2016.  She was last seen here in our office on April 05, 2017 at which time she was doing well.  Since then she has been seen in follow-up by Dr. Rockey Situ.  She has a long history of chronic abdominal pain, and she ultimately was diagnosed with choledochocholelithiasis having previously undergone cholecystectomy in the remote past.  She underwent ERCP in November.  Echocardiogram performed at that time revealed normal left ventricular systolic function.  She returns her office today and reports that she has continued to do well from a cardiac standpoint.  She states that overall she feels "much improved" in comparison with how she felt immediately prior to her heart surgery.  She admits to table symptoms of exertional shortness of breath, but the symptoms occur only with significant physical activity and her symptoms do not seem to slow her down much.  She states that she primarily just gets tired with physical exertion.  She does not experience significant exertional chest pain or chest tightness.  She has stable mild chronic lower extremity edema.  She denies resting shortness of breath, PND, orthopnea,  dizzy spells, or syncope.  Current Outpatient Medications  Medication Sig Dispense Refill  . acetaminophen (TYLENOL) 325 MG tablet Take 2 tablets (650 mg total) by mouth every 6 (six) hours as needed for mild pain or headache.    Marland Kitchen amLODipine (NORVASC) 10 MG tablet Take 1 tablet (10 mg total) by mouth daily. (Patient taking differently: Take 5 mg by mouth daily. )    . aspirin 81 MG tablet Take 81 mg by mouth daily.    Marland Kitchen atorvastatin (LIPITOR) 80 MG tablet Take 1 tablet (80 mg total) by mouth daily.    . calcium carbonate (TUMS EX) 750 MG chewable tablet Chew 2 tablets by mouth as needed for heartburn.    . Calcium Carbonate-Vitamin D (CALCIUM 600 + D PO) Take 1 tablet by mouth daily. 600 mg/ 400 mg    . estrogens, conjugated, (PREMARIN) 0.3 MG tablet Take 0.3 mg by mouth daily. Take daily for 21 days then do not take for 7 days.    . furosemide (LASIX) 20 MG tablet Take 40 mg by mouth daily.     . IRON PO Take 65 mg by mouth.    Marland Kitchen KLOR-CON M20 20 MEQ tablet TAKE 1 TABLET (20 MEQ TOTAL) BY MOUTH 2 (TWO) TIMES DAILY. (Patient taking differently: Take 20 mEq by mouth 2 (two) times daily. ) 180 tablet 3  . LORazepam (ATIVAN) 0.5 MG tablet Take 1 tablet (0.5 mg total) by mouth every 8 (eight) hours as needed for anxiety. 30 tablet 0  . losartan (COZAAR) 100 MG tablet Take 0.5  tablets (50 mg total) by mouth 2 (two) times daily. 90 tablet 3  . metoprolol succinate (TOPROL-XL) 50 MG 24 hr tablet Take 50 mg by mouth daily.   3  . Multiple Vitamin (MULTIVITAMIN) tablet Take 1 tablet by mouth daily.    . nitroGLYCERIN (NITROSTAT) 0.4 MG SL tablet PLACE 1 TABLET (0.4 MG TOTAL) UNDER THE TONGUE EVERY 5 (FIVE) MINUTES AS NEEDED FOR CHEST PAIN. 25 tablet 0  . ondansetron (ZOFRAN) 4 MG tablet Take 1 tablet (4 mg total) by mouth every 8 (eight) hours as needed for nausea or vomiting. 20 tablet 0  . pantoprazole (PROTONIX) 40 MG tablet Take 40 mg by mouth daily.     No current facility-administered medications  for this visit.       Physical Exam:   BP 98/62 (BP Location: Left Arm, Patient Position: Sitting, Cuff Size: Normal)   Pulse 81   Resp 18   Ht 5' 5.75" (1.67 m)   Wt 175 lb 12.8 oz (79.7 kg)   SpO2 99% Comment: RA  BMI 28.59 kg/m   General:  Elderly but well-appearing  Chest:   Clear to auscultation  CV:   Regular rate and rhythm with grade 2/6 systolic murmur heard best along the sternal border  Incisions:  Completely healed  Abdomen:  Soft nontender  Extremities:  Warm and well-perfused  Diagnostic Tests:  Transthoracic Echocardiography  Patient:    Arwa, Yero MR #:       [id number] Study Date: 12/10/2017 Gender:     F Age:        23 Height:     167.6 cm Weight:     80.8 kg BSA:        1.96 m^2 Pt. Status: Room:       [place], MD  ADMITTING    Gean Birchwood N  ORDERING     Lyman Bishop MD  Rochester Hilty MD  ATTENDING    Thurnell Lose  PERFORMING   [facility], Inpatient  SONOGRAPHER  Jannett Celestine, RDCS  cc:  ------------------------------------------------------------------- LV EF: 55% -   60%  ------------------------------------------------------------------- Indications:      CHF - 428.0.  ------------------------------------------------------------------- History:   PMH:   Coronary artery disease.  Transient ischemic attack.  Risk factors:  Hypertension. Dyslipidemia.  ------------------------------------------------------------------- Study Conclusions  - Left ventricle: The cavity size was normal. Wall thickness was   normal. Systolic function was normal. The estimated ejection   fraction was in the range of 55% to 60%. Wall motion was normal;   there were no regional wall motion abnormalities. Doppler   parameters are consistent with abnormal left ventricular   relaxation (grade 1 diastolic dysfunction). The E/e&' ratio is   >15, suggesting elevated LV filling pressure. - Mitral valve:  Mildly thickened leaflets . There was moderate   regurgitation. - Left atrium: The atrium was normal in size. - Tricuspid valve: There was mild regurgitation. - Pulmonary arteries: PA peak pressure: 52 mm Hg (S). - Inferior vena cava: The vessel was dilated. The respirophasic   diameter changes were blunted (< 50%), consistent with elevated   central venous pressure.  Impressions:  - Compared to a prior study in 2018, the LVEF is now 55-60% without   gross regional wall motion abnormalities. Moderate eccentric MR.   There is diastolic dysfunction with elevated LV filling pressure   and moderate pulmonary hypertension.  ------------------------------------------------------------------- Labs, prior tests, procedures,  and surgery: Coronary artery bypass grafting.  ------------------------------------------------------------------- Study data:  Comparison was made to the study of 12/22/2016.  Study status:  Routine.  Procedure:  The patient reported no pain pre or post test. Transthoracic echocardiography. Image quality was adequate. The study was technically difficult, as a result of restricted patient mobility.  Study completion:  There were no complications.          Transthoracic echocardiography.  M-mode, complete 2D, spectral Doppler, and color Doppler.  Birthdate: Patient birthdate: 1930-09-08.  Age:  Patient is 85 yr old.  Sex: Gender: female.    BMI: 28.8 kg/m^2.  Blood pressure:     164/86 Patient status:  Inpatient.  Study date:  Study date: 12/10/2017. Study time: 08:07 AM.  Location:  Bedside.  -------------------------------------------------------------------  ------------------------------------------------------------------- Left ventricle:  The cavity size was normal. Wall thickness was normal. Systolic function was normal. The estimated ejection fraction was in the range of 55% to 60%. Wall motion was normal; there were no regional wall motion  abnormalities. Doppler parameters are consistent with abnormal left ventricular relaxation (grade 1 diastolic dysfunction). The E/e&' ratio is >15, suggesting elevated LV filling pressure.  ------------------------------------------------------------------- Aortic valve:  Sclerosis without stenosis.  Doppler:  There was no regurgitation.  ------------------------------------------------------------------- Aorta:  Aortic root: The aortic root was normal in size. Ascending aorta: The ascending aorta was normal in size.  ------------------------------------------------------------------- Mitral valve:   Mildly thickened leaflets .  Doppler:  There was moderate regurgitation.    Peak gradient (D): 11 mm Hg.  ------------------------------------------------------------------- Left atrium:  The atrium was normal in size.  ------------------------------------------------------------------- Atrial septum:  Poorly visualized.  ------------------------------------------------------------------- Right ventricle:  Poorly visualized.  ------------------------------------------------------------------- Pulmonic valve:    The valve appears to be grossly normal. Doppler:  There was trivial regurgitation.  ------------------------------------------------------------------- Tricuspid valve:   Doppler:  There was mild regurgitation.  ------------------------------------------------------------------- Pulmonary artery:   The main pulmonary artery was normal-sized.  ------------------------------------------------------------------- Right atrium:  Poorly visualized.  ------------------------------------------------------------------- Pericardium:  There was no pericardial effusion.  ------------------------------------------------------------------- Systemic veins: Inferior vena cava: The vessel was dilated. The respirophasic diameter changes were blunted (< 50%), consistent with  elevated central venous pressure.  ------------------------------------------------------------------- Measurements   Left ventricle                              Value        Reference  LV ID, ED, PLAX chordal                     45.6  mm     43 - 52  LV ID, ES, PLAX chordal                     31.3  mm     23 - 38  LV fx shortening, PLAX chordal              31    %      >=29  LV PW thickness, ED                         10.3  mm     ---------  IVS/LV PW ratio, ED                         0.98         <=1.3  Stroke volume, 2D  57    ml     ---------  Stroke volume/bsa, 2D                       29    ml/m^2 ---------  LV e&', lateral                              12.3  cm/s   ---------  LV E/e&', lateral                            13.25        ---------  LV e&', medial                               5     cm/s   ---------  LV E/e&', medial                             32.6         ---------  LV e&', average                              8.65  cm/s   ---------  LV E/e&', average                            18.84        ---------    Ventricular septum                          Value        Reference  IVS thickness, ED                           10.1  mm     ---------    LVOT                                        Value        Reference  LVOT ID, S                                  18    mm     ---------  LVOT area                                   2.54  cm^2   ---------  LVOT peak velocity, S                       97.9  cm/s   ---------  LVOT mean velocity, S                       71.5  cm/s   ---------  LVOT VTI, S  22.6  cm     ---------  LVOT peak gradient, S                       4     mm Hg  ---------    Aorta                                       Value        Reference  Aortic root ID, ED                          29    mm     ---------    Left atrium                                 Value        Reference  LA ID, A-P, ES                               38    mm     ---------  LA ID/bsa, A-P                              1.94  cm/m^2 <=2.2  LA volume, S                                54.5  ml     ---------  LA volume/bsa, S                            27.8  ml/m^2 ---------  LA volume, ES, 1-p A4C                      48.2  ml     ---------  LA volume/bsa, ES, 1-p A4C                  24.6  ml/m^2 ---------  LA volume, ES, 1-p A2C                      57.7  ml     ---------  LA volume/bsa, ES, 1-p A2C                  29.4  ml/m^2 ---------    Mitral valve                                Value        Reference  Mitral E-wave peak velocity                 163   cm/s   ---------  Mitral A-wave peak velocity                 131   cm/s   ---------  Mitral deceleration time            (L)     141   ms     150 - 230  Mitral peak  gradient, D                     11    mm Hg  ---------  Mitral E/A ratio, peak                      1.2          ---------  Mitral maximal regurg velocity,             642   cm/s   ---------  PISA  Mitral regurg VTI, PISA                     223   cm     ---------  Mitral ERO, PISA                            0.06  cm^2   ---------  Mitral regurg volume, PISA                  13    ml     ---------    Pulmonary arteries                          Value        Reference  PA pressure, S, DP                  (H)     52    mm Hg  <=30    Tricuspid valve                             Value        Reference  Tricuspid regurg peak velocity              304   cm/s   ---------  Tricuspid peak RV-RA gradient               37    mm Hg  ---------    Systemic veins                              Value        Reference  Estimated CVP                               15    mm Hg  ---------    Right ventricle                             Value        Reference  TAPSE                                       13.8  mm     ---------  RV pressure, S, DP                  (H)     52    mm Hg  <=30  Legend: (L)  and  (H)  mark  values outside specified reference range.  ------------------------------------------------------------------- Prepared and Electronically Authenticated by  Lyman Bishop MD 2019-11-29T09:07:33  Impression:  Patient is doing well and remains clinically stable from a cardiac standpoint more than 1 year status post emergency coronary artery bypass grafting  Plan:  We have not recommended any changes to the patient's current medications.  She will continue to follow-up regularly with Dr. Rockey Situ.  She has been advised regarding the many benefits of a heart healthy diet and some degree of regular exercise.  All of her questions have been addressed.  In the future she will call and return to see [place] only should specific problems or questions arise.  I spent in excess of 10 minutes during the conduct of this office consultation and >50% of this time involved direct face-to-face encounter with the patient for counseling and/or coordination of their care.   Valentina Gu. Roxy Manns, MD 02/07/2018 2:18 PM

## 2018-02-09 ENCOUNTER — Ambulatory Visit (INDEPENDENT_AMBULATORY_CARE_PROVIDER_SITE_OTHER)
Admission: RE | Admit: 2018-02-09 | Discharge: 2018-02-09 | Disposition: A | Discharge status: Home / Self Care | Payer: Medicare Other | Source: Ambulatory Visit | Attending: Gastroenterology | Admitting: Gastroenterology

## 2018-02-09 DIAGNOSIS — K8050 Calculus of bile duct without cholangitis or cholecystitis without obstruction: Secondary | ICD-10-CM

## 2018-02-09 DIAGNOSIS — Z9889 Other specified postprocedural states: Secondary | ICD-10-CM

## 2018-02-09 DIAGNOSIS — R1010 Upper abdominal pain, unspecified: Secondary | ICD-10-CM | POA: Diagnosis not present

## 2018-02-16 ENCOUNTER — Other Ambulatory Visit (INDEPENDENT_AMBULATORY_CARE_PROVIDER_SITE_OTHER): Payer: Medicare Other

## 2018-02-16 DIAGNOSIS — K8050 Calculus of bile duct without cholangitis or cholecystitis without obstruction: Secondary | ICD-10-CM

## 2018-02-16 DIAGNOSIS — Z9889 Other specified postprocedural states: Secondary | ICD-10-CM

## 2018-02-16 LAB — HEPATIC FUNCTION PANEL
ALBUMIN: 4.2 g/dL (ref 3.5–5.2)
ALT: 20 U/L (ref 0–35)
AST: 25 U/L (ref 0–37)
Alkaline Phosphatase: 117 U/L (ref 39–117)
Bilirubin, Direct: 0.3 mg/dL (ref 0.0–0.3)
Total Bilirubin: 1.7 mg/dL — ABNORMAL HIGH (ref 0.2–1.2)
Total Protein: 7.2 g/dL (ref 6.0–8.3)

## 2018-02-18 ENCOUNTER — Encounter (HOSPITAL_COMMUNITY): Payer: Self-pay | Admitting: *Deleted

## 2018-02-18 ENCOUNTER — Telehealth: Payer: Self-pay | Admitting: Gastroenterology

## 2018-02-18 ENCOUNTER — Other Ambulatory Visit: Payer: Self-pay

## 2018-02-18 NOTE — Telephone Encounter (Signed)
Spoke with pt , denies fever, no drainage. Discussed with Dr. Rush Landmark ok to proceed with procedure on Monday. Pt has been informed.

## 2018-02-18 NOTE — Telephone Encounter (Signed)
Pt has deep cough and wants to know if she can still has egd on Monday 2/10. Pls call her.

## 2018-02-18 NOTE — Progress Notes (Signed)
Pt denies any acute cardiopulmonary issues. Pt stated that she is under the care of Dr. Rockey Situ, Cardiology. Pt instructed to call surgeon for pre-op Aspirin instructions. Pt made aware to stop taking vitamins, fish oil and herbal medications. Do not take any NSAIDs ie: Ibuprofen, Advil, Naproxen (Aleve), Motrin, BC and Goody Powder. Pt verbalized under standing of all pre-op instructions.

## 2018-02-18 NOTE — Anesthesia Preprocedure Evaluation (Addendum)
Anesthesia Evaluation  Patient identified by MRN, date of birth, ID band Patient awake    Reviewed: Allergy & Precautions, NPO status , Patient's Chart, lab work & pertinent test results, reviewed documented beta blocker date and time   History of Anesthesia Complications (+) PONV and history of anesthetic complications  Airway Mallampati: II  TM Distance: >3 FB Neck ROM: Full    Dental no notable dental hx. (+) Dental Advisory Given, Caps   Pulmonary    Pulmonary exam normal        Cardiovascular hypertension, Pt. on medications and Pt. on home beta blockers + angina + CAD, + Cardiac Stents and + CABG  + dysrhythmias Atrial Fibrillation  Rhythm:Regular Rate:Normal + Systolic murmurs TTE 00/86/7619: Study Conclusions  - Left ventricle: The cavity size was normal. Wall thickness was normal. Systolic function was normal. The estimated ejection fraction was in the range of 55% to 60%. Wall motion was normal; there were no regional wall motion abnormalities. Doppler parameters are consistent with abnormal left ventricular relaxation (grade 1 diastolic dysfunction). The E/e&' ratio is >15, suggesting elevated LV filling pressure. - Mitral valve: Mildly thickened leaflets . There was moderate regurgitation. - Left atrium: The atrium was normal in size. - Tricuspid valve: There was mild regurgitation. - Pulmonary arteries: PA peak pressure: 52 mm Hg (S). - Inferior vena cava: The vessel was dilated. The respirophasic diameter changes were blunted (<50%), consistent with elevated central venous pressure.  Impressions:  - Compared to a prior study in 2018, the LVEF is now 55-60% without gross regional wall motion abnormalities. Moderate eccentric MR. There is diastolic dysfunction with elevated LV filling pressure and moderate pulmonary hypertension.    Neuro/Psych PSYCHIATRIC DISORDERS Anxiety  Depression TIA   GI/Hepatic Neg liver ROS,   Endo/Other  negative endocrine ROS  Renal/GU negative Renal ROS     Musculoskeletal   Abdominal   Peds  Hematology   Anesthesia Other Findings   Reproductive/Obstetrics                          Anesthesia Physical Anesthesia Plan  ASA: III  Anesthesia Plan: General   Post-op Pain Management:    Induction: Intravenous  PONV Risk Score and Plan: 4 or greater and Ondansetron, Dexamethasone, Diphenhydramine and Treatment may vary due to age or medical condition  Airway Management Planned: Oral ETT  Additional Equipment:   Intra-op Plan:   Post-operative Plan: Extubation in OR  Informed Consent: I have reviewed the patients History and Physical, chart, labs and discussed the procedure including the risks, benefits and alternatives for the proposed anesthesia with the patient or authorized representative who has indicated his/her understanding and acceptance.     Dental advisory given  Plan Discussed with: Anesthesiologist and CRNA  Anesthesia Plan Comments: (See PAT note by Karoline Caldwell, PA-C )      Anesthesia Quick Evaluation

## 2018-02-18 NOTE — Progress Notes (Signed)
Anesthesia Chart Review: SAME DAY WORKUP   Case:  [id number] Date/Time:  02/21/18 0915   Procedure:  ENDOSCOPIC RETROGRADE CHOLANGIOPANCREATOGRAPHY (ERCP) WITH PROPOFOL (N/A )   Anesthesia type:  General   Pre-op diagnosis:  HX of ERCP choledocholithiasis   Location:  [facility] ROOM 1 / [facility]   Surgeon:  Irving Copas., MD      DISCUSSION: 85 yo female never smoker. Pertinent hx includes CAD status post PCI and CABG November 2018, prior cardiac arrest, HTN, TIA, HLD, prior cholangitis/ choledocholithiasis status post cholecystectomy, status post biliary stent, right lower lobectomy for atypical carcinoid tumor of the lung.  Pt was cleared for ERCP by Dr. Rockey Situ 01/23/18.  She was hospitalized 1/23-1/24/20 for eval of chest pain. Seen by Dr. Harrington Challenger. Per her note "Patient presented with intermittent episodes of sharp substernal chest pain with chills, multiple bowel movements, and excess urination. EKG shows no acute ischemic changes. I-stat troponin negative in the ED. Repeat troponin also negative. Does not appear to be angina   Lean more to GI esp with some discomfort after eating."  Anticipate she can proceed as planned barring acute status change.  VS: There were no vitals taken for this visit.  PROVIDERS: Lajean Manes, MD is PCP  Ida Rogue, MD is Cardiologist  LABS: Will need DOS labs.  Labs Reviewed - No data to display    IMAGES: CHEST - 2 VIEW 02/09/18  COMPARISON:  PA and lateral chest 01/19/2018 and 02/04/2017.  FINDINGS: Lungs are clear. Small right pleural effusion is unchanged since the most recent exam. No left effusion. No pneumothorax. Heart size is normal. Aortic atherosclerosis noted.  IMPRESSION: Small right pleural effusion, unchanged since the most recent exam.  Atherosclerosis.  EKG: 02/04/2018: Sinus rhythm with frequent Premature ventricular complexes in a pattern of bigeminy. Rate 76. Possible Left atrial enlargement  .Nonspecific ST abnormality.  CV: TTE 12/10/2017: Study Conclusions  - Left ventricle: The cavity size was normal. Wall thickness was   normal. Systolic function was normal. The estimated ejection   fraction was in the range of 55% to 60%. Wall motion was normal;   there were no regional wall motion abnormalities. Doppler   parameters are consistent with abnormal left ventricular   relaxation (grade 1 diastolic dysfunction). The E/e&' ratio is   >15, suggesting elevated LV filling pressure. - Mitral valve: Mildly thickened leaflets . There was moderate   regurgitation. - Left atrium: The atrium was normal in size. - Tricuspid valve: There was mild regurgitation. - Pulmonary arteries: PA peak pressure: 52 mm Hg (S). - Inferior vena cava: The vessel was dilated. The respirophasic   diameter changes were blunted (< 50%), consistent with elevated   central venous pressure.  Impressions:  - Compared to a prior study in 2018, the LVEF is now 55-60% without   gross regional wall motion abnormalities. Moderate eccentric MR.   There is diastolic dysfunction with elevated LV filling pressure   and moderate pulmonary hypertension.  Carotid US 08/19/2015: Heterogeneous plaque, bilaterally. Stable 1-39% ICA stenosis, bilaterally. Normal subclavian arteries bilaterally. Patent vertebral arteries with antegrade flow.  Past Medical History:  Diagnosis Date  . Anxiety   . Atypical carcinoid lung tumor ([place])   . CAD (coronary artery disease)    a. 1986: s/p PTCA of bifurcation lesion in LAD;  b. 2001: s/p BMS to LAD & LCX;  c. 04/2006 Cath: diff CAD w/o critical narrowing, nl EF;  d. 12/2014 MV: inflat ST dep, mild basal  inf ischemia, EF 55-65%-->Low risk; e. 12/2016 - 3V CABG.  . Cancer ([place])   . Carotid disease, bilateral ([place])    a. 08/2015 Carotid U/S: < 39% bilat ICA stenosis.  . Cataracts, bilateral   . Cholangitis   . Choledocholithiasis   . Cholelithiases   . Chronic diarrhea    . Depression   . Frequent PVCs   . Gilbert's syndrome   . Headache    PMH: migraines  . History of echocardiogram    a. 06/2010 Echo: EF 60-65%, mild AI/MR; b. 10/2015 Echo: EF 60-65%, no rwma, mild MR, nl RV fxn.  Marland Kitchen HTN (hypertension)   . Hyperlipidemia   . Impaired fasting blood sugar   . Keloid    mid sternum  . Macular degeneration   . Osteoarthritis   . Pneumonia   . PONV (postoperative nausea and vomiting)   . Positional vertigo    benign  . Postoperative atrial fibrillation ([place]) 12/2016  . S/P CABG x 3 12/23/2016   LIMA to LAD, SVG to OM, SVG to PDA, EVH via left thigh  . TIA (transient ischemic attack)   . Wears glasses   . Wears hearing aid     Past Surgical History:  Procedure Laterality Date  . ABDOMINAL AORTOGRAM N/A 12/22/2016   Procedure: ABDOMINAL AORTOGRAM;  Surgeon: Nelva Bush, MD;  Location: [facility];  Service: Cardiovascular;  Laterality: N/A;  . ABDOMINAL HYSTERECTOMY    . BILIARY STENT PLACEMENT  12/10/2017   Procedure: BILIARY STENT PLACEMENT;  Surgeon: Irving Copas., MD;  Location: [facility];  Service: Gastroenterology;;  . CHOLECYSTECTOMY    . CORONARY ARTERY BYPASS GRAFT N/A 12/23/2016   Procedure: CORONARY ARTERY BYPASS GRAFTING (CABG) TIMES THREE USING LEFT INTERNAL MAMMARY ARTERY AND LEFT SAPHENOUS LEG VEIN HARVESTED ENDOSCOPICALLY,;  Surgeon: Rexene Alberts, MD;  Location: [facility];  Service: Open Heart Surgery;  Laterality: N/A;  . ERCP N/A 12/10/2017   Procedure: ENDOSCOPIC RETROGRADE CHOLANGIOPANCREATOGRAPHY (ERCP);  Surgeon: Irving Copas., MD;  Location: [facility];  Service: Gastroenterology;  Laterality: N/A;  . IABP INSERTION N/A 12/22/2016   Procedure: IABP Insertion;  Surgeon: Nelva Bush, MD;  Location: [place] [facility];  Service: Cardiovascular;  Laterality: N/A;  . LEFT HEART CATH AND CORONARY ANGIOGRAPHY N/A 12/22/2016   Procedure: LEFT HEART CATH AND CORONARY ANGIOGRAPHY;  Surgeon:  Nelva Bush, MD;  Location: [place];  Service: Cardiovascular;  Laterality: N/A;  . LEFT HEART CATHETERIZATION WITH CORONARY ANGIOGRAM N/A 04/27/2012   Procedure: LEFT HEART CATHETERIZATION WITH CORONARY ANGIOGRAM;  Surgeon: Peter M Martinique, MD;  Location: [facility] CATH LAB;  Service: Cardiovascular;  Laterality: N/A;  . LOBECTOMY Right    RLL atypical carcinoid tumor - Dr Arlyce Dice  . pci  1986  . PCI of LAD  2001   with non des lad   . pci of mid cfx     with non des and cutting balloon pci of av circumflex  . TEE WITHOUT CARDIOVERSION N/A 12/23/2016   Procedure: TRANSESOPHAGEAL ECHOCARDIOGRAM (TEE);  Surgeon: Rexene Alberts, MD;  Location: [place];  Service: Open Heart Surgery;  Laterality: N/A;    MEDICATIONS: No current facility-administered medications for this encounter.    Marland Kitchen acetaminophen (TYLENOL) 325 MG tablet  . amLODipine (NORVASC) 10 MG tablet  . aspirin 81 MG tablet  . atorvastatin (LIPITOR) 80 MG tablet  . calcium carbonate (TUMS EX) 750 MG chewable tablet  . Calcium Carbonate-Vitamin D (CALCIUM 600 + D  PO)  . estrogens, conjugated, (PREMARIN) 0.3 MG tablet  . furosemide (LASIX) 20 MG tablet  . IRON PO  . KLOR-CON M20 20 MEQ tablet  . LORazepam (ATIVAN) 0.5 MG tablet  . losartan (COZAAR) 100 MG tablet  . metoprolol succinate (TOPROL-XL) 50 MG 24 hr tablet  . Multiple Vitamin (MULTIVITAMIN) tablet  . nitroGLYCERIN (NITROSTAT) 0.4 MG SL tablet  . ondansetron (ZOFRAN) 4 MG tablet  . pantoprazole (PROTONIX) 40 MG tablet   Wynonia Musty [facility] Short Stay Center/Anesthesiology Phone [phone] 02/18/2018 1:06 PM

## 2018-02-18 NOTE — Progress Notes (Signed)
PA, Anesthesiology, asked to review pt history. 

## 2018-02-21 ENCOUNTER — Encounter (HOSPITAL_COMMUNITY): Payer: Self-pay | Admitting: Gastroenterology

## 2018-02-21 ENCOUNTER — Other Ambulatory Visit: Payer: Self-pay

## 2018-02-21 ENCOUNTER — Ambulatory Visit (HOSPITAL_COMMUNITY)
Admission: RE | Admit: 2018-02-21 | Discharge: 2018-02-21 | Disposition: A | Discharge status: Home / Self Care | Payer: Medicare Other | Attending: Gastroenterology | Admitting: Gastroenterology

## 2018-02-21 ENCOUNTER — Ambulatory Visit (HOSPITAL_COMMUNITY): Payer: Medicare Other | Admitting: Physician Assistant

## 2018-02-21 ENCOUNTER — Encounter (HOSPITAL_COMMUNITY)
Admission: RE | Disposition: A | Discharge status: Home / Self Care | Payer: Self-pay | Source: Home / Self Care | Attending: Gastroenterology

## 2018-02-21 ENCOUNTER — Ambulatory Visit (HOSPITAL_COMMUNITY): Payer: Medicare Other

## 2018-02-21 DIAGNOSIS — I4891 Unspecified atrial fibrillation: Secondary | ICD-10-CM | POA: Insufficient documentation

## 2018-02-21 DIAGNOSIS — F419 Anxiety disorder, unspecified: Secondary | ICD-10-CM | POA: Insufficient documentation

## 2018-02-21 DIAGNOSIS — Z833 Family history of diabetes mellitus: Secondary | ICD-10-CM | POA: Diagnosis not present

## 2018-02-21 DIAGNOSIS — Z4659 Encounter for fitting and adjustment of other gastrointestinal appliance and device: Secondary | ICD-10-CM | POA: Diagnosis not present

## 2018-02-21 DIAGNOSIS — Z9889 Other specified postprocedural states: Secondary | ICD-10-CM

## 2018-02-21 DIAGNOSIS — Z880 Allergy status to penicillin: Secondary | ICD-10-CM | POA: Insufficient documentation

## 2018-02-21 DIAGNOSIS — Z888 Allergy status to other drugs, medicaments and biological substances status: Secondary | ICD-10-CM | POA: Insufficient documentation

## 2018-02-21 DIAGNOSIS — I10 Essential (primary) hypertension: Secondary | ICD-10-CM | POA: Insufficient documentation

## 2018-02-21 DIAGNOSIS — Z7982 Long term (current) use of aspirin: Secondary | ICD-10-CM | POA: Diagnosis not present

## 2018-02-21 DIAGNOSIS — Z885 Allergy status to narcotic agent status: Secondary | ICD-10-CM | POA: Insufficient documentation

## 2018-02-21 DIAGNOSIS — Z951 Presence of aortocoronary bypass graft: Secondary | ICD-10-CM | POA: Insufficient documentation

## 2018-02-21 DIAGNOSIS — Z881 Allergy status to other antibiotic agents status: Secondary | ICD-10-CM | POA: Insufficient documentation

## 2018-02-21 DIAGNOSIS — Z8249 Family history of ischemic heart disease and other diseases of the circulatory system: Secondary | ICD-10-CM | POA: Insufficient documentation

## 2018-02-21 DIAGNOSIS — I272 Pulmonary hypertension, unspecified: Secondary | ICD-10-CM | POA: Diagnosis not present

## 2018-02-21 DIAGNOSIS — T85590A Other mechanical complication of bile duct prosthesis, initial encounter: Secondary | ICD-10-CM | POA: Diagnosis not present

## 2018-02-21 DIAGNOSIS — Z90710 Acquired absence of both cervix and uterus: Secondary | ICD-10-CM | POA: Diagnosis not present

## 2018-02-21 DIAGNOSIS — H8110 Benign paroxysmal vertigo, unspecified ear: Secondary | ICD-10-CM | POA: Insufficient documentation

## 2018-02-21 DIAGNOSIS — K8309 Other cholangitis: Secondary | ICD-10-CM | POA: Diagnosis not present

## 2018-02-21 DIAGNOSIS — K8050 Calculus of bile duct without cholangitis or cholecystitis without obstruction: Secondary | ICD-10-CM

## 2018-02-21 DIAGNOSIS — I493 Ventricular premature depolarization: Secondary | ICD-10-CM | POA: Insufficient documentation

## 2018-02-21 DIAGNOSIS — Z79899 Other long term (current) drug therapy: Secondary | ICD-10-CM | POA: Insufficient documentation

## 2018-02-21 DIAGNOSIS — F329 Major depressive disorder, single episode, unspecified: Secondary | ICD-10-CM | POA: Diagnosis not present

## 2018-02-21 DIAGNOSIS — J90 Pleural effusion, not elsewhere classified: Secondary | ICD-10-CM | POA: Insufficient documentation

## 2018-02-21 DIAGNOSIS — H3530 Unspecified macular degeneration: Secondary | ICD-10-CM | POA: Insufficient documentation

## 2018-02-21 DIAGNOSIS — M1990 Unspecified osteoarthritis, unspecified site: Secondary | ICD-10-CM | POA: Diagnosis not present

## 2018-02-21 DIAGNOSIS — E785 Hyperlipidemia, unspecified: Secondary | ICD-10-CM | POA: Insufficient documentation

## 2018-02-21 DIAGNOSIS — K838 Other specified diseases of biliary tract: Secondary | ICD-10-CM | POA: Diagnosis not present

## 2018-02-21 DIAGNOSIS — K8030 Calculus of bile duct with cholangitis, unspecified, without obstruction: Secondary | ICD-10-CM

## 2018-02-21 DIAGNOSIS — I25119 Atherosclerotic heart disease of native coronary artery with unspecified angina pectoris: Secondary | ICD-10-CM | POA: Insufficient documentation

## 2018-02-21 DIAGNOSIS — Z8673 Personal history of transient ischemic attack (TIA), and cerebral infarction without residual deficits: Secondary | ICD-10-CM | POA: Diagnosis not present

## 2018-02-21 DIAGNOSIS — I081 Rheumatic disorders of both mitral and tricuspid valves: Secondary | ICD-10-CM | POA: Diagnosis not present

## 2018-02-21 HISTORY — DX: Nausea with vomiting, unspecified: R11.2

## 2018-02-21 HISTORY — PX: ENDOSCOPIC RETROGRADE CHOLANGIOPANCREATOGRAPHY (ERCP) WITH PROPOFOL: SHX5810

## 2018-02-21 HISTORY — PX: REMOVAL OF STONES: SHX5545

## 2018-02-21 HISTORY — DX: Headache, unspecified: R51.9

## 2018-02-21 HISTORY — DX: Other specified postprocedural states: Z98.890

## 2018-02-21 HISTORY — DX: Headache: R51

## 2018-02-21 HISTORY — PX: SPHINCTEROTOMY: SHX5544

## 2018-02-21 HISTORY — DX: Presence of spectacles and contact lenses: Z97.3

## 2018-02-21 HISTORY — PX: STENT REMOVAL: SHX6421

## 2018-02-21 HISTORY — DX: Presence of external hearing-aid: Z97.4

## 2018-02-21 HISTORY — DX: Pneumonia, unspecified organism: J18.9

## 2018-02-21 SURGERY — ENDOSCOPIC RETROGRADE CHOLANGIOPANCREATOGRAPHY (ERCP) WITH PROPOFOL
Anesthesia: General

## 2018-02-21 MED ORDER — PROMETHAZINE HCL 25 MG/ML IJ SOLN: 6.2500 mg | INTRAMUSCULAR | Status: DC | PRN

## 2018-02-21 MED ORDER — SODIUM CHLORIDE 0.9 % IV SOLN
1.0000 g | Freq: Once | INTRAVENOUS | Status: DC
  Filled 2018-02-21: qty 10

## 2018-02-21 MED ORDER — DEXAMETHASONE SODIUM PHOSPHATE 4 MG/ML IJ SOLN
INTRAMUSCULAR | Status: DC | PRN
  Administered 2018-02-21: 4 mg via INTRAVENOUS

## 2018-02-21 MED ORDER — CEPHALEXIN 500 MG PO CAPS: 500.0000 mg | ORAL_CAPSULE | Freq: Two times a day (BID) | ORAL | 0 refills | Status: AC

## 2018-02-21 MED ORDER — PROPOFOL 10 MG/ML IV BOLUS
INTRAVENOUS | Status: DC | PRN
  Administered 2018-02-21: 170 mg via INTRAVENOUS
  Administered 2018-02-21 (×2): 10 mg via INTRAVENOUS

## 2018-02-21 MED ORDER — INDOMETHACIN 50 MG RE SUPP
RECTAL | Status: AC
  Filled 2018-02-21: qty 2

## 2018-02-21 MED ORDER — IOPAMIDOL (ISOVUE-300) INJECTION 61%
INTRAVENOUS | Status: AC
  Filled 2018-02-21: qty 150

## 2018-02-21 MED ORDER — GLUCAGON HCL RDNA (DIAGNOSTIC) 1 MG IJ SOLR
INTRAMUSCULAR | Status: AC
  Filled 2018-02-21: qty 1

## 2018-02-21 MED ORDER — LIDOCAINE 2% (20 MG/ML) 5 ML SYRINGE
INTRAMUSCULAR | Status: DC | PRN
  Administered 2018-02-21: 100 mg via INTRAVENOUS

## 2018-02-21 MED ORDER — ONDANSETRON HCL 4 MG/2ML IJ SOLN
INTRAMUSCULAR | Status: DC | PRN
  Administered 2018-02-21: 4 mg via INTRAVENOUS

## 2018-02-21 MED ORDER — FENTANYL CITRATE (PF) 100 MCG/2ML IJ SOLN: 25.0000 ug | INTRAMUSCULAR | Status: DC | PRN

## 2018-02-21 MED ORDER — IOPAMIDOL (ISOVUE-300) INJECTION 61%
INTRAVENOUS | Status: DC | PRN
  Administered 2018-02-21: 50 mL via INTRAVENOUS

## 2018-02-21 MED ORDER — CEFTRIAXONE SODIUM 1 G IJ SOLR
1.0000 g | Freq: Once | INTRAMUSCULAR | Status: DC
  Administered 2018-02-21: 1 g via INTRAMUSCULAR
  Filled 2018-02-21 (×2): qty 10

## 2018-02-21 MED ORDER — EPHEDRINE SULFATE-NACL 50-0.9 MG/10ML-% IV SOSY
PREFILLED_SYRINGE | INTRAVENOUS | Status: DC | PRN
  Administered 2018-02-21: 5 mg via INTRAVENOUS

## 2018-02-21 MED ORDER — FENTANYL CITRATE (PF) 100 MCG/2ML IJ SOLN
INTRAMUSCULAR | Status: DC | PRN
  Administered 2018-02-21: 100 ug via INTRAVENOUS

## 2018-02-21 MED ORDER — ROCURONIUM BROMIDE 10 MG/ML (PF) SYRINGE
PREFILLED_SYRINGE | INTRAVENOUS | Status: DC | PRN
  Administered 2018-02-21: 5 mg via INTRAVENOUS
  Administered 2018-02-21: 45 mg via INTRAVENOUS

## 2018-02-21 MED ORDER — SODIUM CHLORIDE 0.9 % IV SOLN: INTRAVENOUS | Status: DC

## 2018-02-21 MED ORDER — PHENYLEPHRINE 40 MCG/ML (10ML) SYRINGE FOR IV PUSH (FOR BLOOD PRESSURE SUPPORT)
PREFILLED_SYRINGE | INTRAVENOUS | Status: DC | PRN
  Administered 2018-02-21: 120 ug via INTRAVENOUS
  Administered 2018-02-21: 80 ug via INTRAVENOUS

## 2018-02-21 MED ORDER — SUGAMMADEX SODIUM 200 MG/2ML IV SOLN
INTRAVENOUS | Status: DC | PRN
  Administered 2018-02-21: 175 mg via INTRAVENOUS

## 2018-02-21 MED ORDER — LACTATED RINGERS IV SOLN
INTRAVENOUS | Status: DC
  Administered 2018-02-21: 11:00:00 via INTRAVENOUS
  Administered 2018-02-21: 1000 mL via INTRAVENOUS

## 2018-02-21 MED ORDER — GLUCAGON HCL RDNA (DIAGNOSTIC) 1 MG IJ SOLR
INTRAMUSCULAR | Status: DC | PRN
  Administered 2018-02-21 (×2): 0.25 mg via INTRAVENOUS

## 2018-02-21 MED ORDER — INDOMETHACIN 50 MG RE SUPP
RECTAL | Status: DC | PRN
  Administered 2018-02-21: 100 mg via RECTAL

## 2018-02-21 NOTE — Anesthesia Postprocedure Evaluation (Signed)
Anesthesia Post Note  Patient: [name]  Procedure(s) Performed: ENDOSCOPIC RETROGRADE CHOLANGIOPANCREATOGRAPHY (ERCP) WITH PROPOFOL (N/A ) SPHINCTEROTOMY REMOVAL OF STONES     Patient location during evaluation: PACU Anesthesia Type: General Level of consciousness: sedated Pain management: pain level controlled Vital Signs Assessment: post-procedure vital signs reviewed and stable Respiratory status: spontaneous breathing and respiratory function stable Cardiovascular status: stable Postop Assessment: no apparent nausea or vomiting Anesthetic complications: no    Last Vitals:  Vitals:   02/21/18 1135 02/21/18 1200  BP: (!) 163/73 (!) 160/68  Pulse: 66 66  Resp: 16 15  Temp: (!) 36.4 C (!) 36.4 C  SpO2: 99% 100%    Last Pain:  Vitals:   02/21/18 1140  TempSrc:   PainSc: 0-No pain                 Alicia Seib DANIEL

## 2018-02-21 NOTE — H&P (Signed)
GASTROENTEROLOGY OUTPATIENT PROCEDURE H&P NOTE   Primary Care Physician: Lajean Manes, MD  HPI: [name] is a 85 y.o. female who presents for ERCP.  Past Medical History:  Diagnosis Date  . Anxiety   . Atypical carcinoid lung tumor ([place])   . CAD (coronary artery disease)    a. 1986: s/p PTCA of bifurcation lesion in LAD;  b. 2001: s/p BMS to LAD & LCX;  c. 04/2006 Cath: diff CAD w/o critical narrowing, nl EF;  d. 12/2014 MV: inflat ST dep, mild basal inf ischemia, EF 55-65%-->Low risk; e. 12/2016 - 3V CABG.  . Cancer ([place])   . Carotid disease, bilateral ([place])    a. 08/2015 Carotid U/S: < 39% bilat ICA stenosis.  . Cataracts, bilateral   . Cholangitis   . Choledocholithiasis   . Cholelithiases   . Chronic diarrhea   . Depression   . Frequent PVCs   . Gilbert's syndrome   . Headache    PMH: migraines  . History of echocardiogram    a. 06/2010 Echo: EF 60-65%, mild AI/MR; b. 10/2015 Echo: EF 60-65%, no rwma, mild MR, nl RV fxn.  Marland Kitchen HTN (hypertension)   . Hyperlipidemia   . Impaired fasting blood sugar   . Keloid    mid sternum  . Macular degeneration   . Osteoarthritis   . Pneumonia   . PONV (postoperative nausea and vomiting)   . Positional vertigo    benign  . Postoperative atrial fibrillation ([place]) 12/2016  . S/P CABG x 3 12/23/2016   LIMA to LAD, SVG to OM, SVG to PDA, EVH via left thigh  . TIA (transient ischemic attack)   . Wears glasses   . Wears hearing aid    Past Surgical History:  Procedure Laterality Date  . ABDOMINAL AORTOGRAM N/A 12/22/2016   Procedure: ABDOMINAL AORTOGRAM;  Surgeon: Nelva Bush, MD;  Location: [place] [facility];  Service: Cardiovascular;  Laterality: N/A;  . ABDOMINAL HYSTERECTOMY    . BILIARY STENT PLACEMENT  12/10/2017   Procedure: BILIARY STENT PLACEMENT;  Surgeon: Irving Copas., MD;  Location: [place];  Service: Gastroenterology;;  . CHOLECYSTECTOMY    . CORONARY ARTERY BYPASS GRAFT N/A 12/23/2016   Procedure: CORONARY ARTERY BYPASS GRAFTING (CABG) TIMES THREE USING LEFT INTERNAL MAMMARY ARTERY AND LEFT SAPHENOUS LEG VEIN HARVESTED ENDOSCOPICALLY,;  Surgeon: Rexene Alberts, MD;  Location: [place];  Service: Open Heart Surgery;  Laterality: N/A;  . ERCP N/A 12/10/2017   Procedure: ENDOSCOPIC RETROGRADE CHOLANGIOPANCREATOGRAPHY (ERCP);  Surgeon: Irving Copas., MD;  Location: [place];  Service: Gastroenterology;  Laterality: N/A;  . IABP INSERTION N/A 12/22/2016   Procedure: IABP Insertion;  Surgeon: Nelva Bush, MD;  Location: [place] [facility];  Service: Cardiovascular;  Laterality: N/A;  . LEFT HEART CATH AND CORONARY ANGIOGRAPHY N/A 12/22/2016   Procedure: LEFT HEART CATH AND CORONARY ANGIOGRAPHY;  Surgeon: Nelva Bush, MD;  Location: [place] [facility];  Service: Cardiovascular;  Laterality: N/A;  . LEFT HEART CATHETERIZATION WITH CORONARY ANGIOGRAM N/A 04/27/2012   Procedure: LEFT HEART CATHETERIZATION WITH CORONARY ANGIOGRAM;  Surgeon: Peter M Martinique, MD;  Location: [facility] CATH LAB;  Service: Cardiovascular;  Laterality: N/A;  . LOBECTOMY Right    RLL atypical carcinoid tumor - Dr Arlyce Dice  . pci  1986  . PCI of LAD  2001   with non des lad   . pci of mid cfx     with non des and cutting balloon pci of av  circumflex  . TEE WITHOUT CARDIOVERSION N/A 12/23/2016   Procedure: TRANSESOPHAGEAL ECHOCARDIOGRAM (TEE);  Surgeon: Rexene Alberts, MD;  Location: [place];  Service: Open Heart Surgery;  Laterality: N/A;   No current facility-administered medications for this encounter.    Allergies  Allergen Reactions  . Sertraline Hcl Nausea Only  . Biaxin [Clarithromycin]     depressed  . Celecoxib Other (See Comments)    Reaction: Unknown  . Ciprofloxacin   . Codeine Other (See Comments)    Reaction: Unknown  . Hydromorphone   . Lisinopril     cough  . Macrobid [employer] Macro] Other (See Comments)    Reaction: Unknown  . Oxycodone Hcl Other (See  Comments)    Reaction: Unknown  . Prednisone   . Sertraline Hcl Other (See Comments)    Reaction: Unknown  . Penicillins Rash    Reaction: Unknown   Family History  Problem Relation Age of Onset  . Diabetes Mellitus I Mother   . CAD Mother   . CAD Father   . Diabetes Mellitus I Sister   . Diabetes Mellitus I Brother   . Diabetes Mellitus I Sister   . Colon cancer Neg Hx   . Esophageal cancer Neg Hx   . Inflammatory bowel disease Neg Hx   . Liver disease Neg Hx   . Pancreatic cancer Neg Hx   . Rectal cancer Neg Hx   . Stomach cancer Neg Hx    Social History   Socioeconomic History  . Marital status: Divorced    Spouse name: Not on file  . Number of children: Not on file  . Years of education: Not on file  . Highest education level: Not on file  Occupational History  . Occupation: retired  Scientific laboratory technician  . Financial resource strain: Not on file  . Food insecurity:    Worry: Not on file    Inability: Not on file  . Transportation needs:    Medical: Not on file    Non-medical: Not on file  Tobacco Use  . Smoking status: Never Smoker  . Smokeless tobacco: Never Used  Substance and Sexual Activity  . Alcohol use: No  . Drug use: No  . Sexual activity: Not on file  Lifestyle  . Physical activity:    Days per week: 0 days    Minutes per session: Not on file  . Stress: Not at all  Relationships  . Social connections:    Talks on phone: Patient refused    Gets together: Patient refused    Attends religious service: Patient refused    Active member of club or organization: Patient refused    Attends meetings of clubs or organizations: Patient refused    Relationship status: Patient refused  . Intimate partner violence:    Fear of current or ex partner: Patient refused    Emotionally abused: Patient refused    Physically abused: Patient refused    Forced sexual activity: Patient refused  Other Topics Concern  . Not on file  Social History Narrative  . Not on  file    Physical Exam: Vital signs in last 24 hours:     GEN: NAD EYE: Sclerae anicteric ENT: MMM CV: RR without R/Gs  RESP: CTAB posteriorly GI: Soft, NT/ND NEURO:  Alert & Oriented x 3  Lab Results: No results for input(s): WBC, HGB, HCT, PLT in the last 72 hours. BMET No results for input(s): NA, K, CL, CO2, GLUCOSE, BUN, CREATININE, CALCIUM  in the last 72 hours. LFT No results for input(s): PROT, ALBUMIN, AST, ALT, ALKPHOS, BILITOT, BILIDIR, IBILI in the last 72 hours. PT/INR No results for input(s): LABPROT, INR in the last 72 hours.   Impression / Plan: This is a 85 y.o.female who presents for ERCP.  The risks and benefits of endoscopic evaluation were discussed with the patient; these include but are not limited to the risk of perforation, infection, bleeding, missed lesions, lack of diagnosis, severe illness requiring hospitalization, as well as anesthesia and sedation related illnesses.  The patient is agreeable to proceed.    Justice Britain, MD [facility] Advanced [facility]oscopy Office # [phone]

## 2018-02-21 NOTE — Op Note (Signed)
[facility] Patient Name: [name] Procedure Date : 02/21/2018 MRN: [id number] Attending MD: Justice Britain , MD Date of Birth: 1930-09-09 CSN: [id number] Age: 85 Admit Type: Outpatient Procedure:                ERCP Indications:              Bile duct stone(s), Follow-up of ascending                            cholangitis, Biliary stent removal Providers:                Justice Britain, MD, Carlyn Reichert, RN, Elspeth Cho Tech., Technician, Luciana Axe, CRNA Referring MD:             Adelfa Koh. Stoneking MD, MD Medicines:                General Anesthesia, Indomethacin 100 mg PR,                            Rocephin 1 g IV Complications:            No immediate complications. Estimated Blood Loss:     Estimated blood loss was minimal. Procedure:                Pre-Anesthesia Assessment:                           - Prior to the procedure, a History and Physical                            was performed, and patient medications and                            allergies were reviewed. The patient's tolerance of                            previous anesthesia was also reviewed. The risks                            and benefits of the procedure and the sedation                            options and risks were discussed with the patient.                            All questions were answered, and informed consent                            was obtained. Prior Anticoagulants: The patient has                            taken aspirin. ASA Grade Assessment: III - A  patient with severe systemic disease. After                            reviewing the risks and benefits, the patient was                            deemed in satisfactory condition to undergo the                            procedure.                           After obtaining informed consent, the scope was                            passed under direct vision.  Throughout the                            procedure, the patient's blood pressure, pulse, and                            oxygen saturations were monitored continuously. The                            [id number]-Q180V ([id number]) Olympus Duodensocope was                            introduced through the mouth, and used to inject                            contrast into and used to inject contrast into the                            bile duct. The ERCP was accomplished without                            difficulty. The patient tolerated the procedure.                            Fluroscopy on CANOPY. Scope In: Scope Out: Findings:      A scout film of the abdomen was obtained. Surgical clips, consistent       with previous cholecystectomy, were seen in the area of the right upper       quadrant of the abdomen. One stent ending in the main bile duct was seen.      The esophagus was successfully intubated under direct vision without       detailed examination of the pharynx, larynx, and associated structures,       and upper GI tract. One plastic biliary stent originating in the biliary       tree was emerging from the major papilla. The stent was partially       occluded. This stent was removed from the biliary tree using a snare.       The major papilla was slightly flat but otherwise normal.      A short  0.035 inch Soft Jagwire was passed into the biliary tree. The       Autotome sphincterotome was passed over the guidewire and the bile duct       was then deeply cannulated. Contrast was injected. I personally       interpreted the bile duct images. Ductal flow of contrast was adequate.       Image quality was adequate. Contrast extended to the hepatic ducts.       Opacification of the entire biliary tree except for the cystic duct and       gallbladder was successful. The main bile duct was severely dilated. The       largest diameter was 18 mm. The lower third of the main duct and middle        third of the main bile duct contained multiple filling defects       consistent with stone debris and sludge. A 7 mm biliary sphincterotomy       was made with a monofilament Autotome sphincterotome using ERBE       electrocautery as the papilla was relatively flat after stent removal.       There was no post-sphincterotomy bleeding. The biliary tree was swept       with a retrieval balloon 12-15 mm then 15-18 mm starting at the       bifurcation. Sludge was swept from the duct. Many stone fragments were       removed. No stones remained. An occlusion cholangiogram was performed.       The middle third of the main bile duct contained a localized       irregularity that was seen on fluoroscopy that was suggestive of the       likely area of previous stent placement and large stone that had been       seen on initial ERCP. Flow of bile was normal and an 18 mm balloon was       able to pass through the region without resistance. Otherwise, the       cholangiogram showed no further significant biliary pathology.      A pancreatogram was not performed.      The duodenoscope was withdrawn from the patient. Impression:               - One partially occluded stent from the biliary                            tree was seen in the major papilla. This was                            removed.                           - The major papilla appeared slightly flat but                            otherwise normal.                           - Filling defects consistent with stones and sludge                            were seen on  the cholangiogram.                           - The entire main bile duct was severely dilated.                           - Choledocholithiasis was found. Complete removal                            was accomplished by biliary sphincterotomy and                            balloon sweeping.                           - A slight mid-bile duct irregularity was noted                             during occlusion cholangiogram and may be a result                            of previous stenting and large stone in the region                            previously. Drainage was adequate and 18 mm balloon                            was able to traverse region without issue. Recommendation:           - The patient will be observed post-procedure,                            until all discharge criteria are met.                           - Discharge patient to home.                           - Patient has a contact number available for                            emergencies. The signs and symptoms of potential                            delayed complications were discussed with the                            patient. Return to normal activities tomorrow.                            Written discharge instructions were provided to the                            patient.                           -  Observe patient's clinical course.                           - Check liver enzymes (AST, ALT, alkaline                            phosphatase, bilirubin) in 1-2 weeks.                           - Keflex 500 BID x 5-days (Rx sent to pharmacy) -                            previously tolerated when admitted with sepsis and                            discharged on Keflex.                           - Watch for pancreatitis, bleeding, perforation,                            and cholangitis.                           - Will see back in clinic in 4 weeks and then                            consider additional cross-sectional imaging.                           - OK to initiate Aspirin later back today.                           - The findings and recommendations were discussed                            with the patient.                           - The findings and recommendations were discussed                            with the patient's family. Procedure Code(s):        --- Professional ---                            [phone], Endoscopic retrograde                            cholangiopancreatography (ERCP); with removal of                            foreign body(s) or stent(s) from biliary/pancreatic                            duct(s)  [phone], Endoscopic retrograde                            cholangiopancreatography (ERCP); with removal of                            calculi/debris from biliary/pancreatic duct(s)                           43262, Endoscopic retrograde                            cholangiopancreatography (ERCP); with                            sphincterotomy/papillotomy Diagnosis Code(s):        --- Professional ---                           B55.974B, Other mechanical complication of bile                            duct prosthesis, initial encounter                           K80.30, Calculus of bile duct with cholangitis,                            unspecified, without obstruction                           R93.2, Abnormal findings on diagnostic imaging of                            liver and biliary tract                           Z46.59, Encounter for fitting and adjustment of                            other gastrointestinal appliance and device                           K83.09, Other cholangitis                           K83.8, Other specified diseases of biliary tract CPT copyright 2018 [employer]. All rights reserved. The codes documented in this report are preliminary and upon coder review may  be revised to meet current compliance requirements. Justice Britain, MD 02/21/2018 11:22:53 AM Number of Addenda: 0

## 2018-02-21 NOTE — Discharge Instructions (Signed)
YOU HAD AN ENDOSCOPIC PROCEDURE TODAY: Refer to the procedure report and other information in the discharge instructions given to you for any specific questions about what was found during the examination. If this information does not answer your questions, please call [place] office at [phone] to clarify.  ° °YOU SHOULD EXPECT: Some feelings of bloating in the abdomen. Passage of more gas than usual. Walking can help get rid of the air that was put into your GI tract during the procedure and reduce the bloating. If you had a lower endoscopy (such as a colonoscopy or flexible sigmoidoscopy) you may notice spotting of blood in your stool or on the toilet paper. Some abdominal soreness may be present for a day or two, also. ° °DIET: Your first meal following the procedure should be a light meal and then it is ok to progress to your normal diet. A half-sandwich or bowl of soup is an example of a good first meal. Heavy or fried foods are harder to digest and may make you feel nauseous or bloated. Drink plenty of fluids but you should avoid alcoholic beverages for 24 hours. If you had a esophageal dilation, please see attached instructions for diet.   ° °ACTIVITY: Your care partner should take you home directly after the procedure. You should plan to take it easy, moving slowly for the rest of the day. You can resume normal activity the day after the procedure however YOU SHOULD NOT DRIVE, use power tools, machinery or perform tasks that involve climbing or major physical exertion for 24 hours (because of the sedation medicines used during the test).  ° °SYMPTOMS TO REPORT IMMEDIATELY: °A gastroenterologist can be reached at any hour. Please call [phone]  for any of the following symptoms:  °Following lower endoscopy (colonoscopy, flexible sigmoidoscopy) °Excessive amounts of blood in the stool  °Significant tenderness, worsening of abdominal pains  °Swelling of the abdomen that is new, acute  °Fever of 100° or  higher  °Following upper endoscopy (EGD, EUS, ERCP, esophageal dilation) °Vomiting of blood or coffee ground material  °New, significant abdominal pain  °New, significant chest pain or pain under the shoulder blades  °Painful or persistently difficult swallowing  °New shortness of breath  °Black, tarry-looking or red, bloody stools ° °FOLLOW UP:  °If any biopsies were taken you will be contacted by phone or by letter within the next 1-3 weeks. Call [phone]  if you have not heard about the biopsies in 3 weeks.  °Please also call with any specific questions about appointments or follow up tests. ° °

## 2018-02-21 NOTE — Anesthesia Procedure Notes (Signed)
Procedure Name: Intubation Date/Time: 02/21/2018 10:07 AM Performed by: Jenne Campus, CRNA Pre-anesthesia Checklist: Patient identified, Emergency Drugs available, Suction available and Patient being monitored Patient Re-evaluated:Patient Re-evaluated prior to induction Oxygen Delivery Method: Circle System Utilized Preoxygenation: Pre-oxygenation with 100% oxygen Induction Type: IV induction Ventilation: Mask ventilation without difficulty Laryngoscope Size: Miller and 2 Grade View: Grade II Tube type: Oral Tube size: 7.0 mm Number of attempts: 1 Airway Equipment and Method: Stylet Placement Confirmation: ETT inserted through vocal cords under direct vision,  positive ETCO2 and breath sounds checked- equal and bilateral Secured at: 21 cm Tube secured with: Tape Dental Injury: Teeth and Oropharynx as per pre-operative assessment

## 2018-02-21 NOTE — Transfer of Care (Signed)
Immediate Anesthesia Transfer of Care Note  Patient: [name]  Procedure(s) Performed: ENDOSCOPIC RETROGRADE CHOLANGIOPANCREATOGRAPHY (ERCP) WITH PROPOFOL (N/A ) SPHINCTEROTOMY REMOVAL OF STONES  Patient Location: PACU  Anesthesia Type:General  Level of Consciousness: oriented, drowsy and patient cooperative  Airway & Oxygen Therapy: Patient Spontanous Breathing and Patient connected to nasal cannula oxygen  Post-op Assessment: Report given to RN and Post -op Vital signs reviewed and stable  Post vital signs: Reviewed  Last Vitals:  Vitals Value Taken Time  BP 162/67 02/21/2018 11:19 AM  Temp    Pulse 74 02/21/2018 11:22 AM  Resp 16 02/21/2018 11:22 AM  SpO2 99 % 02/21/2018 11:22 AM  Vitals shown include unvalidated device data.  Last Pain:  Vitals:   02/21/18 0749  TempSrc: Oral  PainSc: 0-No pain         Complications: No apparent anesthesia complications

## 2018-02-22 ENCOUNTER — Encounter (HOSPITAL_COMMUNITY): Payer: Self-pay | Admitting: Gastroenterology

## 2018-02-28 ENCOUNTER — Ambulatory Visit: Payer: Self-pay | Admitting: Physician Assistant

## 2018-02-28 ENCOUNTER — Other Ambulatory Visit: Payer: Self-pay | Admitting: Cardiovascular Disease

## 2018-02-28 DIAGNOSIS — H353212 Exudative age-related macular degeneration, right eye, with inactive choroidal neovascularization: Secondary | ICD-10-CM | POA: Diagnosis not present

## 2018-02-28 NOTE — Telephone Encounter (Signed)
Spoke with patient.  She verified that she was taking Metoprolol 50MG  1 tablet daily.

## 2018-03-30 ENCOUNTER — Telehealth: Payer: Self-pay | Admitting: Cardiovascular Disease

## 2018-03-30 NOTE — Telephone Encounter (Signed)
Patient has an appt tomorrow with Dr. Rockey Situ and wants to know if she should come.    Patient advised triage calling to advise and do a screening and will assess at this time if appt should be changed.

## 2018-03-30 NOTE — Progress Notes (Signed)
I Cardiology Office Note  Date:  03/31/2018   ID:  JACE DOWE, DOB [birth year], MRN [id number]  PCP:  Lajean Manes, MD   Chief Complaint  Patient presents with  . Other    Hospital follow up. Patient denies chest pain and SOB. Meds reviewed verbally with patient.     HPI:  Ms. [name] is a 85 y.o. woman with  CAD,  prior PCI of the LAD in 2001 with non DES, PCI of mid LCX with non des  chronic chest pain episodes,   cardiac catheterization April 2014 showing occluded RCA, patent LAD and left circumflex and  50% carotid disease on the left.  Hospital December 2018 with chest pain, non-ST elevation MI Cardiac catheterization with critical left main disease, three-vessel disease Underwent CABG x3 Postop day 5 developed atrial fibrillation, given amiodarone, normal sinus rhythm restored LIMA to the LAD, vein graft to PDA, vein graft to OM Long history of GI issues, diarrhea, She presents today for follow-up of her coronary artery disease, frequent PVCs, hypertension, carotid arterial disease   INTERVAL HISTORY: The patient reports today for a hospital follow up. She is doing well today. She is avoiding crowds at the moment. She has not been regularly exercising due to staying inside, but is considering walking around neighborhood.  January 2020 admission to the hospital with chest pain, epigastric pain Hospital records reviewed with the patient in detail Evaluated by cardiology during her admission, symptoms felt to be GI in etiology, possibly related to gallbladder  In February 2020 she was noted to have gallbladder disease, stones for which she underwent ERCP. She is feeling better    She is currently taking losartan 50 mg daily, not 100 mg as prescribed  her blood pressure is well today. She is not monitoring her blood pressure at home.  Most recent echocardiogram in November 2019 reviewed with her. It showed ejection fraction of 55 - 60%   Reports that she is taking Lasix  daily, she reports taking 40 mg daily  Blood pressure 138/60 Reviewed labs with her in detail today. Total Chol 121/ LDL 41 HBA1C 5.6 CR 0.86 Glucose 103  EKG personally reviewed by myself on todays visit Shows sinus bradycardia rhythm with premature atrial complexes. 57 bpm. Possible left atrial enlargement. Nonspecific T wave abnormality.    OTHER PAST MEDICAL HISTORY REVIEWED BY ME FOR TODAY'S VISIT:  Martin Majestic to the emergency room 07/03/2017 for hypertension Blood pressure 950 systolic at home  anxious  Long history of GI issues, diarrhea, has to take extra potassium daily Most recent potassium above 3.5 at the end of December 2016  Chronic leg weakness, does her ADLs, does not work in the garden anymore, unable to get up if she is on the ground   PMH:   has a past medical history of Anxiety, Atypical carcinoid lung tumor ([place]), CAD (coronary artery disease), Cancer ([place]), Carotid disease, bilateral ([place]), Cataracts, bilateral, Cholangitis, Choledocholithiasis, Cholelithiases, Chronic diarrhea, Depression, Frequent PVCs, Gilbert's syndrome, Headache, History of echocardiogram, HTN (hypertension), Hyperlipidemia, Impaired fasting blood sugar, Keloid, Macular degeneration, Osteoarthritis, Pneumonia, PONV (postoperative nausea and vomiting), Positional vertigo, Postoperative atrial fibrillation ([place]) (12/2016), S/P CABG x 3 (12/23/2016), TIA (transient ischemic attack), Wears glasses, and Wears hearing aid.  PSH:    Past Surgical History:  Procedure Laterality Date  . ABDOMINAL AORTOGRAM N/A 12/22/2016   Procedure: ABDOMINAL AORTOGRAM;  Surgeon: Nelva Bush, MD;  Location: [place] [facility];  Service: Cardiovascular;  Laterality: N/A;  . ABDOMINAL HYSTERECTOMY    .  BILIARY STENT PLACEMENT  12/10/2017   Procedure: BILIARY STENT PLACEMENT;  Surgeon: Irving Copas., MD;  Location: [place];  Service: Gastroenterology;;  . CHOLECYSTECTOMY    . CORONARY ARTERY BYPASS  GRAFT N/A 12/23/2016   Procedure: CORONARY ARTERY BYPASS GRAFTING (CABG) TIMES THREE USING LEFT INTERNAL MAMMARY ARTERY AND LEFT SAPHENOUS LEG VEIN HARVESTED ENDOSCOPICALLY,;  Surgeon: Rexene Alberts, MD;  Location: [place];  Service: Open Heart Surgery;  Laterality: N/A;  . ENDOSCOPIC RETROGRADE CHOLANGIOPANCREATOGRAPHY (ERCP) WITH PROPOFOL N/A 02/21/2018   Procedure: ENDOSCOPIC RETROGRADE CHOLANGIOPANCREATOGRAPHY (ERCP) WITH PROPOFOL;  Surgeon: Rush Landmark Telford Nab., MD;  Location: [place];  Service: Gastroenterology;  Laterality: N/A;  . ERCP N/A 12/10/2017   Procedure: ENDOSCOPIC RETROGRADE CHOLANGIOPANCREATOGRAPHY (ERCP);  Surgeon: Irving Copas., MD;  Location: [place];  Service: Gastroenterology;  Laterality: N/A;  . IABP INSERTION N/A 12/22/2016   Procedure: IABP Insertion;  Surgeon: Nelva Bush, MD;  Location: [place] [facility];  Service: Cardiovascular;  Laterality: N/A;  . LEFT HEART CATH AND CORONARY ANGIOGRAPHY N/A 12/22/2016   Procedure: LEFT HEART CATH AND CORONARY ANGIOGRAPHY;  Surgeon: Nelva Bush, MD;  Location: [place] [facility];  Service: Cardiovascular;  Laterality: N/A;  . LEFT HEART CATHETERIZATION WITH CORONARY ANGIOGRAM N/A 04/27/2012   Procedure: LEFT HEART CATHETERIZATION WITH CORONARY ANGIOGRAM;  Surgeon: Peter M Martinique, MD;  [facility];  Service: Cardiovascular;  Laterality: N/A;  . LOBECTOMY Right    RLL atypical carcinoid tumor - Dr Arlyce Dice  . pci  1986  . PCI of LAD  2001   with non des lad   . pci of mid cfx     with non des and cutting balloon pci of av circumflex  . REMOVAL OF STONES  02/21/2018   Procedure: REMOVAL OF STONES;  Surgeon: Rush Landmark Telford Nab., MD;  Location: [place];  Service: Gastroenterology;;  . Joan Mayans  02/21/2018   Procedure: Joan Mayans;  Surgeon: Irving Copas., MD;  Location: [place];  Service: Gastroenterology;;  . Lavell Islam REMOVAL  02/21/2018   Procedure: STENT  REMOVAL;  Surgeon: Irving Copas., MD;  Location: [place];  Service: Gastroenterology;;  . TEE WITHOUT CARDIOVERSION N/A 12/23/2016   Procedure: TRANSESOPHAGEAL ECHOCARDIOGRAM (TEE);  Surgeon: Rexene Alberts, MD;  Location: [place];  Service: Open Heart Surgery;  Laterality: N/A;    Current Outpatient Medications  Medication Sig Dispense Refill  . acetaminophen (TYLENOL) 325 MG tablet Take 2 tablets (650 mg total) by mouth every 6 (six) hours as needed for mild pain or headache.    Marland Kitchen amLODipine (NORVASC) 10 MG tablet Take 1 tablet (10 mg total) by mouth daily. (Patient taking differently: Take 5 mg by mouth daily. )    . aspirin 81 MG tablet Take 81 mg by mouth daily.    Marland Kitchen atorvastatin (LIPITOR) 80 MG tablet Take 1 tablet (80 mg total) by mouth daily.    . calcium carbonate (TUMS EX) 750 MG chewable tablet Chew 2 tablets by mouth as needed for heartburn.    . Calcium Carbonate-Vitamin D (CALCIUM 600 + D PO) Take 1 tablet by mouth daily. 600 mg/ 400 mg    . estrogens, conjugated, (PREMARIN) 0.3 MG tablet Take 0.3 mg by mouth daily. Take daily for 21 days then do not take for 7 days.    . furosemide (LASIX) 40 MG tablet Take 1 tablet (40 mg total) by mouth daily. 90 tablet 3  . IRON PO Take 65 mg by mouth.    Marland Kitchen  KLOR-CON M20 20 MEQ tablet TAKE 1 TABLET (20 MEQ TOTAL) BY MOUTH 2 (TWO) TIMES DAILY. (Patient taking differently: Take 20 mEq by mouth 2 (two) times daily. ) 180 tablet 3  . LORazepam (ATIVAN) 0.5 MG tablet Take 1 tablet (0.5 mg total) by mouth every 8 (eight) hours as needed for anxiety. 30 tablet 0  . losartan (COZAAR) 100 MG tablet Take 0.5 tablets (50 mg total) by mouth 2 (two) times daily. 90 tablet 3  . metoprolol succinate (TOPROL-XL) 50 MG 24 hr tablet TAKE 1 TABLET (50 MG TOTAL) BY MOUTH DAILY. TAKE WITH OR IMMEDIATELY FOLLOWING A MEAL. 90 tablet 3  . Multiple Vitamin (MULTIVITAMIN) tablet Take 1 tablet by mouth daily.    . nitroGLYCERIN (NITROSTAT) 0.4 MG SL tablet  PLACE 1 TABLET (0.4 MG TOTAL) UNDER THE TONGUE EVERY 5 (FIVE) MINUTES AS NEEDED FOR CHEST PAIN. 25 tablet 0  . ondansetron (ZOFRAN) 4 MG tablet Take 1 tablet (4 mg total) by mouth every 8 (eight) hours as needed for nausea or vomiting. 20 tablet 0  . pantoprazole (PROTONIX) 40 MG tablet Take 40 mg by mouth daily.     No current facility-administered medications for this visit.      Allergies:   Sertraline hcl; Biaxin [clarithromycin]; Celecoxib; Ciprofloxacin; Codeine; Hydromorphone; Lisinopril; Macrobid [nitrofurantoin monohyd macro]; Oxycodone hcl; Prednisone; Sertraline hcl; and Penicillins   Social History:  The patient  reports that she has never smoked. She has never used smokeless tobacco. She reports that she does not drink alcohol or use drugs.   Family History:   family history includes CAD in her father and mother; Diabetes Mellitus I in her brother, mother, sister, and sister.    Review of Systems: Review of Systems  Constitutional: Negative.   Eyes: Negative.   Respiratory: Negative.  Negative for shortness of breath.   Cardiovascular: Negative.  Negative for chest pain.  Gastrointestinal: Negative.   Genitourinary: Negative.   Musculoskeletal: Negative.   Neurological: Negative.   Psychiatric/Behavioral: Negative.   All other systems reviewed and are negative.    PHYSICAL EXAM: VS:  BP 138/60 (BP Location: Left Arm, Patient Position: Sitting, Cuff Size: Normal)   Pulse (!) 57   Ht 5' 6.5" (1.689 m)   Wt 180 lb (81.6 kg)   BMI 28.62 kg/m  , BMI Body mass index is 28.62 kg/m.   Constitutional:  oriented to person, place, and time. No distress.  HENT:  Head: Grossly normal Eyes:  no discharge. No scleral icterus.  Neck: No JVD, no carotid bruits  Cardiovascular: Regular rate and rhythm, no murmurs appreciated Pulmonary/Chest: Clear to auscultation bilaterally, no wheezes or rails Abdominal: Soft.  no distension.  no tenderness.  Musculoskeletal: Normal range  of motion Neurological:  normal muscle tone. Coordination normal. No atrophy Skin: Skin warm and dry Psychiatric: normal affect, pleasant   Recent Labs: 02/03/2018: Hemoglobin 12.9; Magnesium 2.0; Platelets 165 02/04/2018: BUN 16; Creatinine, Ser 0.86; Potassium 3.5; Sodium 140 02/16/2018: ALT 20    Lipid Panel Lab Results  Component Value Date   CHOL 121 02/04/2018   HDL 47 02/04/2018   LDLCALC 41 02/04/2018   TRIG 167 (H) 02/04/2018      Wt Readings from Last 3 Encounters:  03/31/18 180 lb (81.6 kg)  02/07/18 175 lb 12.8 oz (79.7 kg)  02/04/18 178 lb 9.2 oz (81 kg)      ASSESSMENT AND PLAN:   CABG three-vessel bypass surgery November 2018 Continue aspirin aspirin statin beta-blocker Cholesterol at  goal  Coronary artery disease involving native coronary artery of native heart with angina pectoris with documented spasm ([place])  Plan:  Previously declined cardiac rehabilitation Recommended regular walking program Denies any symptoms concerning for angina -Recent episode of chest pain felt secondary to gallbladder disease, no further symptoms since ERCP  Essential hypertension  Plan: EKG 12-Lead Blood pressure stable, no changes made Only taking losartan 50 with metoprolol and amlodipine  PVC's (premature ventricular contractions) History of asymptomatic PVCs Prior history of bigeminal pattern Asymptomatic  Left carotid stenosis Less than 39% blockage in the carotids No further workup at this time    Total encounter time more than 25 minutes  Greater than 50% was spent in counseling and coordination of care with the patient   Disposition:   F/U  6 months   Orders Placed This Encounter  Procedures  . EKG 12-Lead   I, Jesus Reyes am acting as a scribe for Ida Rogue, M.D., Ph.D.  I, Ida Rogue, M.D. Ph.D., have reviewed the above documentation for accuracy and completeness, and I agree with the above.   Signed, Esmond Plants, M.D.,  Ph.D. 03/31/2018  [place], [place]

## 2018-03-30 NOTE — Telephone Encounter (Signed)
COVID-19 Pre-Screening:  1. Have you been in contact with someone who was sick?  no 2. Do you have any of the following symptoms (cough, fever, muscle pain, vomiting, diarrhea, weakness abdominal pain, rash, red eye, bruising or bleeding, joint pain, severe headache)?  No  3. Have you travelled internationally or out of state in the last month?  no 4. Do you need any refills at this time?  no  Patient aware of the following: Please be advised that we require, no one but yourself to come to appointment. If necessary, only one visitor may come with you into the building. They will also be asked the same screening questions. You will be contacted at a later time to reschedule. However, this will depend on ongoing evaluation of the Covid-19 situation.  Please call us if any new questions or concerns arise. We are here for advice.  Routing to COVID cancel pool.

## 2018-03-31 ENCOUNTER — Encounter: Payer: Self-pay | Admitting: Cardiovascular Disease

## 2018-03-31 ENCOUNTER — Other Ambulatory Visit: Payer: Self-pay

## 2018-03-31 ENCOUNTER — Ambulatory Visit (INDEPENDENT_AMBULATORY_CARE_PROVIDER_SITE_OTHER): Payer: Medicare Other | Admitting: Cardiovascular Disease

## 2018-03-31 VITALS — BP 138/60 | HR 57 | Ht 66.5 in | Wt 180.0 lb

## 2018-03-31 DIAGNOSIS — I10 Essential (primary) hypertension: Secondary | ICD-10-CM

## 2018-03-31 DIAGNOSIS — I209 Angina pectoris, unspecified: Secondary | ICD-10-CM | POA: Diagnosis not present

## 2018-03-31 DIAGNOSIS — I6522 Occlusion and stenosis of left carotid artery: Secondary | ICD-10-CM | POA: Diagnosis not present

## 2018-03-31 DIAGNOSIS — I469 Cardiac arrest, cause unspecified: Secondary | ICD-10-CM

## 2018-03-31 DIAGNOSIS — I25110 Atherosclerotic heart disease of native coronary artery with unstable angina pectoris: Secondary | ICD-10-CM

## 2018-03-31 DIAGNOSIS — Z951 Presence of aortocoronary bypass graft: Secondary | ICD-10-CM | POA: Diagnosis not present

## 2018-03-31 DIAGNOSIS — E7800 Pure hypercholesterolemia, unspecified: Secondary | ICD-10-CM

## 2018-03-31 MED ORDER — FUROSEMIDE 40 MG PO TABS: 40.0000 mg | ORAL_TABLET | Freq: Every day | ORAL | 3 refills | Status: DC

## 2018-03-31 NOTE — Patient Instructions (Signed)
Please confirm the dose of the furosemide/lasix 40 mg ? Once a day   Medication Instructions:  No changes  If you need a refill on your cardiac medications before your next appointment, please call your pharmacy.    Lab work: No new labs needed   If you have labs (blood work) drawn today and your tests are completely normal, you will receive your results only by: Marland Kitchen MyChart Message (if you have MyChart) OR . A paper copy in the mail If you have any lab test that is abnormal or we need to change your treatment, we will call you to review the results.   Testing/Procedures: No new testing needed   Follow-Up: At [facility], you and your health needs are our priority.  As part of our continuing mission to provide you with exceptional heart care, we have created designated Provider Care Teams.  These Care Teams include your primary Cardiologist (physician) and Advanced Practice Providers (APPs -  Physician Assistants and Nurse Practitioners) who all work together to provide you with the care you need, when you need it.  . You will need a follow up appointment in 6 months .   Please call our office 2 months in advance to schedule this appointment.    . Providers on your designated Care Team:   . Murray Hodgkins, NP . Christell Faith, PA-C . Marrianne Mood, PA-C  Any Other Special Instructions Will Be Listed Below (If Applicable).  For educational health videos Log in to : [email or web address] Or : [email or web address], password : [email or web address]

## 2018-04-08 ENCOUNTER — Ambulatory Visit: Payer: Self-pay | Admitting: Gastroenterology

## 2018-05-24 DIAGNOSIS — H353212 Exudative age-related macular degeneration, right eye, with inactive choroidal neovascularization: Secondary | ICD-10-CM | POA: Diagnosis not present

## 2018-07-06 DIAGNOSIS — R7309 Other abnormal glucose: Secondary | ICD-10-CM | POA: Diagnosis not present

## 2018-07-06 DIAGNOSIS — I351 Nonrheumatic aortic (valve) insufficiency: Secondary | ICD-10-CM | POA: Diagnosis not present

## 2018-07-06 DIAGNOSIS — F3340 Major depressive disorder, recurrent, in remission, unspecified: Secondary | ICD-10-CM | POA: Diagnosis not present

## 2018-07-06 DIAGNOSIS — I10 Essential (primary) hypertension: Secondary | ICD-10-CM | POA: Diagnosis not present

## 2018-07-06 DIAGNOSIS — I700 Atherosclerosis of aorta: Secondary | ICD-10-CM | POA: Diagnosis not present

## 2018-07-06 DIAGNOSIS — E7800 Pure hypercholesterolemia, unspecified: Secondary | ICD-10-CM | POA: Diagnosis not present

## 2018-07-06 DIAGNOSIS — Z79899 Other long term (current) drug therapy: Secondary | ICD-10-CM | POA: Diagnosis not present

## 2018-07-06 DIAGNOSIS — I272 Pulmonary hypertension, unspecified: Secondary | ICD-10-CM | POA: Diagnosis not present

## 2018-07-06 DIAGNOSIS — Z0000 Encounter for general adult medical examination without abnormal findings: Secondary | ICD-10-CM | POA: Diagnosis not present

## 2018-07-25 DIAGNOSIS — I2510 Atherosclerotic heart disease of native coronary artery without angina pectoris: Secondary | ICD-10-CM | POA: Diagnosis not present

## 2018-07-25 DIAGNOSIS — I209 Angina pectoris, unspecified: Secondary | ICD-10-CM | POA: Diagnosis not present

## 2018-07-25 DIAGNOSIS — F3340 Major depressive disorder, recurrent, in remission, unspecified: Secondary | ICD-10-CM | POA: Diagnosis not present

## 2018-07-25 DIAGNOSIS — I10 Essential (primary) hypertension: Secondary | ICD-10-CM | POA: Diagnosis not present

## 2018-08-22 DIAGNOSIS — H353212 Exudative age-related macular degeneration, right eye, with inactive choroidal neovascularization: Secondary | ICD-10-CM | POA: Diagnosis not present

## 2018-09-12 DIAGNOSIS — I209 Angina pectoris, unspecified: Secondary | ICD-10-CM | POA: Diagnosis not present

## 2018-09-12 DIAGNOSIS — F3340 Major depressive disorder, recurrent, in remission, unspecified: Secondary | ICD-10-CM | POA: Diagnosis not present

## 2018-09-12 DIAGNOSIS — I10 Essential (primary) hypertension: Secondary | ICD-10-CM | POA: Diagnosis not present

## 2018-09-12 DIAGNOSIS — I2510 Atherosclerotic heart disease of native coronary artery without angina pectoris: Secondary | ICD-10-CM | POA: Diagnosis not present

## 2018-09-14 DIAGNOSIS — I209 Angina pectoris, unspecified: Secondary | ICD-10-CM | POA: Diagnosis not present

## 2018-09-14 DIAGNOSIS — I10 Essential (primary) hypertension: Secondary | ICD-10-CM | POA: Diagnosis not present

## 2018-09-14 DIAGNOSIS — F3340 Major depressive disorder, recurrent, in remission, unspecified: Secondary | ICD-10-CM | POA: Diagnosis not present

## 2018-09-14 DIAGNOSIS — I2510 Atherosclerotic heart disease of native coronary artery without angina pectoris: Secondary | ICD-10-CM | POA: Diagnosis not present

## 2018-09-26 ENCOUNTER — Other Ambulatory Visit: Payer: Self-pay

## 2018-09-26 MED ORDER — NITROGLYCERIN 0.4 MG SL SUBL: 0.4000 mg | SUBLINGUAL_TABLET | SUBLINGUAL | 0 refills | Status: DC | PRN

## 2018-09-26 NOTE — Telephone Encounter (Signed)
Patient needs to follow up with Dr. Saunders Revel for further refills.  A message sent to the scheduling pool for to contact the patient.

## 2018-09-28 DIAGNOSIS — H4322 Crystalline deposits in vitreous body, left eye: Secondary | ICD-10-CM | POA: Diagnosis not present

## 2018-10-07 ENCOUNTER — Other Ambulatory Visit: Payer: Self-pay | Admitting: Cardiovascular Disease

## 2018-10-14 DIAGNOSIS — I351 Nonrheumatic aortic (valve) insufficiency: Secondary | ICD-10-CM | POA: Diagnosis not present

## 2018-10-14 DIAGNOSIS — Z23 Encounter for immunization: Secondary | ICD-10-CM | POA: Diagnosis not present

## 2018-10-14 DIAGNOSIS — I10 Essential (primary) hypertension: Secondary | ICD-10-CM | POA: Diagnosis not present

## 2018-10-14 DIAGNOSIS — Z79899 Other long term (current) drug therapy: Secondary | ICD-10-CM | POA: Diagnosis not present

## 2018-10-31 ENCOUNTER — Other Ambulatory Visit: Payer: Self-pay

## 2018-10-31 ENCOUNTER — Emergency Department: Payer: Medicare Other

## 2018-10-31 ENCOUNTER — Emergency Department
Admission: EM | Admit: 2018-10-31 | Discharge: 2018-10-31 | Disposition: A | Discharge status: Home / Self Care | Payer: Medicare Other | Attending: Emergency Medicine | Admitting: Emergency Medicine

## 2018-10-31 DIAGNOSIS — Z8673 Personal history of transient ischemic attack (TIA), and cerebral infarction without residual deficits: Secondary | ICD-10-CM | POA: Diagnosis not present

## 2018-10-31 DIAGNOSIS — I252 Old myocardial infarction: Secondary | ICD-10-CM | POA: Diagnosis not present

## 2018-10-31 DIAGNOSIS — R531 Weakness: Secondary | ICD-10-CM | POA: Diagnosis not present

## 2018-10-31 DIAGNOSIS — I10 Essential (primary) hypertension: Secondary | ICD-10-CM

## 2018-10-31 DIAGNOSIS — Z951 Presence of aortocoronary bypass graft: Secondary | ICD-10-CM | POA: Diagnosis not present

## 2018-10-31 DIAGNOSIS — J329 Chronic sinusitis, unspecified: Secondary | ICD-10-CM | POA: Diagnosis not present

## 2018-10-31 DIAGNOSIS — Z955 Presence of coronary angioplasty implant and graft: Secondary | ICD-10-CM | POA: Diagnosis not present

## 2018-10-31 DIAGNOSIS — I2510 Atherosclerotic heart disease of native coronary artery without angina pectoris: Secondary | ICD-10-CM | POA: Diagnosis not present

## 2018-10-31 DIAGNOSIS — H538 Other visual disturbances: Secondary | ICD-10-CM | POA: Diagnosis not present

## 2018-10-31 DIAGNOSIS — R2689 Other abnormalities of gait and mobility: Secondary | ICD-10-CM | POA: Diagnosis not present

## 2018-10-31 LAB — COMPREHENSIVE METABOLIC PANEL
ALT: 27 U/L (ref 0–44)
AST: 33 U/L (ref 15–41)
Albumin: 4.2 g/dL (ref 3.5–5.0)
Alkaline Phosphatase: 102 U/L (ref 38–126)
Anion gap: 8 (ref 5–15)
BUN: 17 mg/dL (ref 8–23)
CO2: 30 mmol/L (ref 22–32)
Calcium: 9.9 mg/dL (ref 8.9–10.3)
Chloride: 101 mmol/L (ref 98–111)
Creatinine, Ser: 0.97 mg/dL (ref 0.44–1.00)
GFR calc Af Amer: >60 mL/min (ref >60)
GFR calc non Af Amer: 53 mL/min — ABNORMAL LOW (ref >60)
Glucose, Bld: 142 mg/dL — ABNORMAL HIGH (ref 70–99)
Potassium: 3.5 mmol/L (ref 3.5–5.1)
Sodium: 139 mmol/L (ref 135–145)
Total Bilirubin: 1.9 mg/dL — ABNORMAL HIGH (ref 0.3–1.2)
Total Protein: 7.9 g/dL (ref 6.5–8.1)

## 2018-10-31 LAB — CBC WITH DIFFERENTIAL/PLATELET
Abs Immature Granulocytes: 0.03 10*3/uL (ref 0.00–0.07)
Basophils Absolute: 0 10*3/uL (ref 0.0–0.1)
Basophils Relative: 1 %
Eosinophils Absolute: 0.1 10*3/uL (ref 0.0–0.5)
Eosinophils Relative: 2 %
HCT: 41.6 % (ref 36.0–46.0)
Hemoglobin: 13.9 g/dL (ref 12.0–15.0)
Immature Granulocytes: 1 %
Lymphocytes Relative: 18 %
Lymphs Abs: 1.1 10*3/uL (ref 0.7–4.0)
MCH: 30.4 pg (ref 26.0–34.0)
MCHC: 33.4 g/dL (ref 30.0–36.0)
MCV: 91 fL (ref 80.0–100.0)
Monocytes Absolute: 0.6 10*3/uL (ref 0.1–1.0)
Monocytes Relative: 9 %
Neutro Abs: 4.1 10*3/uL (ref 1.7–7.7)
Neutrophils Relative %: 69 %
Platelets: 198 10*3/uL (ref 150–400)
RBC: 4.57 MIL/uL (ref 3.87–5.11)
RDW: 12.4 % (ref 11.5–15.5)
WBC: 5.9 10*3/uL (ref 4.0–10.5)
nRBC: 0 % (ref 0.0–0.2)

## 2018-10-31 LAB — URINALYSIS, COMPLETE (UACMP) WITH MICROSCOPIC
Bacteria, UA: NONE SEEN
Bilirubin Urine: NEGATIVE
Glucose, UA: NEGATIVE mg/dL
Hgb urine dipstick: NEGATIVE
Ketones, ur: NEGATIVE mg/dL
Leukocytes,Ua: NEGATIVE
Nitrite: NEGATIVE
Protein, ur: NEGATIVE mg/dL
Specific Gravity, Urine: 1.003 — ABNORMAL LOW (ref 1.005–1.030)
pH: 7 (ref 5.0–8.0)

## 2018-10-31 LAB — TROPONIN I (HIGH SENSITIVITY): Troponin I (High Sensitivity): 5 ng/L (ref <18)

## 2018-10-31 MED ORDER — AZITHROMYCIN 250 MG PO TABS: ORAL_TABLET | ORAL | 0 refills | Status: DC

## 2018-10-31 MED ORDER — METHYLPREDNISOLONE 4 MG PO TBPK: ORAL_TABLET | ORAL | 0 refills | Status: DC

## 2018-10-31 NOTE — ED Notes (Signed)
Pt assisted to toilet at this time. Pt able to stand and pivot on her own and take a few steps. Pt then transported to CT

## 2018-10-31 NOTE — ED Triage Notes (Signed)
3 days not feeling right. yesterday feeling wobbly unsteady on feet. Ems reports visual changes, blurry vision seeing spots. Hx htn, and open heart surgery unsure of date. Ems reports pt was able to ambulate to bathroom, ems said seemed wobbly, but family reported that as her normal gait. MD at bedside to assess  Ems vitals 213/91 99% HR 83

## 2018-10-31 NOTE — ED Notes (Signed)
Pt assisted to toilet at this time

## 2018-10-31 NOTE — ED Provider Notes (Signed)
[facility] Emergency Department Provider Note       Time seen: ----------------------------------------- 10:13 AM on 10/31/2018 -----------------------------------------   I have reviewed the triage vital signs and the nursing notes.  HISTORY   Chief Complaint No chief complaint on file.    HPI [name] is a 85 y.o. female with a history of anxiety, coronary artery disease, cancer, depression, hyperlipidemia, hypertension, coronary artery disease who presents to the ED for general ill feeling.  Patient states for the past 3 days she has not been feeling well, yesterday she felt "wobbly".  She states this was very brief and she does not currently have it.  She has been seeing some spots in her vision.  She was concerned she may have had a stroke because of her symptoms.  She has also had hypertension.  Past Medical History:  Diagnosis Date  . Anxiety   . Atypical carcinoid lung tumor ([place])   . CAD (coronary artery disease)    a. 1986: s/p PTCA of bifurcation lesion in LAD;  b. 2001: s/p BMS to LAD & LCX;  c. 04/2006 Cath: diff CAD w/o critical narrowing, nl EF;  d. 12/2014 MV: inflat ST dep, mild basal inf ischemia, EF 55-65%-->Low risk; e. 12/2016 - 3V CABG.  . Cancer ([place])   . Carotid disease, bilateral ([place])    a. 08/2015 Carotid U/S: < 39% bilat ICA stenosis.  . Cataracts, bilateral   . Cholangitis   . Choledocholithiasis   . Cholelithiases   . Chronic diarrhea   . Depression   . Frequent PVCs   . Gilbert's syndrome   . Headache    PMH: migraines  . History of echocardiogram    a. 06/2010 Echo: EF 60-65%, mild AI/MR; b. 10/2015 Echo: EF 60-65%, no rwma, mild MR, nl RV fxn.  Marland Kitchen HTN (hypertension)   . Hyperlipidemia   . Impaired fasting blood sugar   . Keloid    mid sternum  . Macular degeneration   . Osteoarthritis   . Pneumonia   . PONV (postoperative nausea and vomiting)   . Positional vertigo    benign  . Postoperative atrial  fibrillation ([place]) 12/2016  . S/P CABG x 3 12/23/2016   LIMA to LAD, SVG to OM, SVG to PDA, EVH via left thigh  . TIA (transient ischemic attack)   . Wears glasses   . Wears hearing aid     Patient Active Problem List   Diagnosis Date Noted  . History of ERCP 01/25/2018  . History of biliary stent insertion 01/25/2018  . Coronary artery disease involving native coronary artery of native heart without angina pectoris   . Acute cholangitis 12/08/2017  . Elevated troponin 12/08/2017  . Choledocholithiasis 12/08/2017  . S/P CABG x 3 12/23/2016  . On intra-aortic balloon pump assist   . NSTEMI (non-ST elevated myocardial infarction) ([place]) 12/22/2016  . Weakness of lower extremity 12/09/2015  . Viral URI 11/07/2015  . Hypokalemia 11/07/2015  . Cough   . Diaphoresis   . Sore throat   . Non-ST elevation (NSTEMI) myocardial infarction ([place]) 12/29/2014  . Chest pain 12/28/2014  . Angina pectoris ([place])   . Coronary artery disease involving native coronary artery of native heart with unstable angina pectoris ([place])   . Emesis   . Gastroenteritis, acute   . PVC's (premature ventricular contractions) 04/25/2012  . Cardiac arrest ([place])   . Left carotid stenosis 03/01/2012  . Leg fatigue 03/01/2012  . Dysuria 03/30/2011  .  Mitral regurgitation 03/30/2011  . HYPERCHOLESTEROLEMIA  IIA 08/06/2008  . Carotid bruit 05/28/2008  . Essential hypertension 05/11/2008  . Coronary atherosclerosis 05/11/2008  . Angina at rest [facility]) 05/11/2008    Past Surgical History:  Procedure Laterality Date  . ABDOMINAL AORTOGRAM N/A 12/22/2016   Procedure: ABDOMINAL AORTOGRAM;  Surgeon: Nelva Bush, MD;  Location: [place] CV LAB;  Service: Cardiovascular;  Laterality: N/A;  . ABDOMINAL HYSTERECTOMY    . BILIARY STENT PLACEMENT  12/10/2017   Procedure: BILIARY STENT PLACEMENT;  Surgeon: Irving Copas., MD;  Location: [place];  Service: Gastroenterology;;  . CHOLECYSTECTOMY    .  CORONARY ARTERY BYPASS GRAFT N/A 12/23/2016   Procedure: CORONARY ARTERY BYPASS GRAFTING (CABG) TIMES THREE USING LEFT INTERNAL MAMMARY ARTERY AND LEFT SAPHENOUS LEG VEIN HARVESTED ENDOSCOPICALLY,;  Surgeon: Rexene Alberts, MD;  Location: [place];  Service: Open Heart Surgery;  Laterality: N/A;  . ENDOSCOPIC RETROGRADE CHOLANGIOPANCREATOGRAPHY (ERCP) WITH PROPOFOL N/A 02/21/2018   Procedure: ENDOSCOPIC RETROGRADE CHOLANGIOPANCREATOGRAPHY (ERCP) WITH PROPOFOL;  Surgeon: Rush Landmark Telford Nab., MD;  Location: [place];  Service: Gastroenterology;  Laterality: N/A;  . ERCP N/A 12/10/2017   Procedure: ENDOSCOPIC RETROGRADE CHOLANGIOPANCREATOGRAPHY (ERCP);  Surgeon: Irving Copas., MD;  Location: [place];  Service: Gastroenterology;  Laterality: N/A;  . IABP INSERTION N/A 12/22/2016   Procedure: IABP Insertion;  Surgeon: Nelva Bush, MD;  Location: [place] CV LAB;  Service: Cardiovascular;  Laterality: N/A;  . LEFT HEART CATH AND CORONARY ANGIOGRAPHY N/A 12/22/2016   Procedure: LEFT HEART CATH AND CORONARY ANGIOGRAPHY;  Surgeon: Nelva Bush, MD;  Location: [place] CV LAB;  Service: Cardiovascular;  Laterality: N/A;  . LEFT HEART CATHETERIZATION WITH CORONARY ANGIOGRAM N/A 04/27/2012   Procedure: LEFT HEART CATHETERIZATION WITH CORONARY ANGIOGRAM;  Surgeon: Peter M Martinique, MD;  Location: [facility] CATH LAB;  Service: Cardiovascular;  Laterality: N/A;  . LOBECTOMY Right    RLL atypical carcinoid tumor - Dr Arlyce Dice  . pci  1986  . PCI of LAD  2001   with non des lad   . pci of mid cfx     with non des and cutting balloon pci of av circumflex  . REMOVAL OF STONES  02/21/2018   Procedure: REMOVAL OF STONES;  Surgeon: Rush Landmark Telford Nab., MD;  Location: [place];  Service: Gastroenterology;;  . Joan Mayans  02/21/2018   Procedure: Joan Mayans;  Surgeon: Irving Copas., MD;  Location: [place];  Service: Gastroenterology;;  . Lavell Islam REMOVAL  02/21/2018    Procedure: STENT REMOVAL;  Surgeon: Irving Copas., MD;  Location: [place];  Service: Gastroenterology;;  . TEE WITHOUT CARDIOVERSION N/A 12/23/2016   Procedure: TRANSESOPHAGEAL ECHOCARDIOGRAM (TEE);  Surgeon: Rexene Alberts, MD;  Location: [place];  Service: Open Heart Surgery;  Laterality: N/A;    Allergies Sertraline hcl, Biaxin [clarithromycin], Celecoxib, Ciprofloxacin, Codeine, Hydromorphone, Lisinopril, Macrobid [nitrofurantoin monohyd macro], Oxycodone hcl, Prednisone, Sertraline hcl, and Penicillins  Social History Social History   Tobacco Use  . Smoking status: Never Smoker  . Smokeless tobacco: Never Used  Substance Use Topics  . Alcohol use: No  . Drug use: No    Review of Systems Constitutional: Negative for fever. HEENT: Positive for vision changes Cardiovascular: Negative for chest pain. Respiratory: Negative for shortness of breath. Gastrointestinal: Negative for abdominal pain, vomiting and diarrhea. Musculoskeletal: Negative for back pain. Skin: Negative for rash. Neurological: Negative for headaches, positive for weakness  All systems negative/normal/unremarkable except as stated in the HPI  ____________________________________________   PHYSICAL EXAM:  VITAL SIGNS: ED Triage Vitals  Enc Vitals Group     BP      Pulse      Resp      Temp      Temp src      SpO2      Weight      Height      Head Circumference      Peak Flow      Pain Score      Pain Loc      Pain Edu?      Excl. in [place]?    Constitutional: Alert and oriented. Well appearing and in no distress. Eyes: Conjunctivae are normal. Normal extraocular movements. ENT      Head: Normocephalic and atraumatic.      Nose: No congestion/rhinnorhea.      Mouth/Throat: Mucous membranes are moist.      Neck: No stridor. Cardiovascular: Normal rate, regular rhythm. No murmurs, rubs, or gallops. Respiratory: Normal respiratory effort without tachypnea nor retractions. Breath  sounds are clear and equal bilaterally. No wheezes/rales/rhonchi. Gastrointestinal: Soft and nontender. Normal bowel sounds Musculoskeletal: Nontender with normal range of motion in extremities. No lower extremity tenderness nor edema. Neurologic:  Normal speech and language. No gross focal neurologic deficits are appreciated.  Skin:  Skin is warm, dry and intact. No rash noted. Psychiatric: Mood and affect are normal. Speech and behavior are normal.  ____________________________________________  EKG: Interpreted by me.  Sinus rhythm with rate of 80 bpm, prolonged PR interval, bigeminy pattern, normal QT  ____________________________________________  ED COURSE:  As part of my medical decision making, I reviewed the following data within the [place] History obtained from family if available, nursing notes, old chart and ekg, as well as notes from prior ED visits. Patient presented for weakness and unsteadiness on her feet, we will assess with labs and imaging as indicated at this time.   Procedures  [name] was evaluated in Emergency Department on 10/31/2018 for the symptoms described in the history of present illness. She was evaluated in the context of the global COVID-19 pandemic, which necessitated consideration that the patient might be at risk for infection with the SARS-CoV-2 virus that causes COVID-19. Institutional protocols and algorithms that pertain to the evaluation of patients at risk for COVID-19 are in a state of rapid change based on information released by regulatory bodies including the CDC and federal and state organizations. These policies and algorithms were followed during the patient's care in the ED.  ____________________________________________   LABS (pertinent positives/negatives)  Labs Reviewed  COMPREHENSIVE METABOLIC PANEL - Abnormal; Notable for the following components:      Result Value   Glucose, Bld 142 (*)    Total Bilirubin 1.9  (*)    GFR calc non Af Amer 53 (*)    All other components within normal limits  URINALYSIS, COMPLETE (UACMP) WITH MICROSCOPIC - Abnormal; Notable for the following components:   Color, Urine STRAW (*)    APPearance CLEAR (*)    Specific Gravity, Urine 1.003 (*)    All other components within normal limits  CBC WITH DIFFERENTIAL/PLATELET  CBG MONITORING, ED  TROPONIN I (HIGH SENSITIVITY)    RADIOLOGY Images were viewed by me  CT head IMPRESSION:  1. Progressive age related cerebral atrophy, ventriculomegaly and  advanced periventricular white matter disease.  2. No acute intracranial findings or mass lesions.  3. Advanced vascular calcifications but no aneurysm or hyperdense  vessels.  4.  Small amount of fluid in both halves of the sphenoid sinus.  ____________________________________________   DIFFERENTIAL DIAGNOSIS   CVA, TIA, hypertension, anxiety, dehydration, electrolyte abnormality  FINAL ASSESSMENT AND PLAN  Hypertension, sinusitis   Plan: The patient had presented for generally not feeling well as well as elevated blood pressure.  She does not currently have any neurologic symptoms.  Patient's labs are reassuring. Patient's imaging revealed some sphenoid sinusitis which may explain some of her symptoms.  I will place her on low-dose steroids and antibiotics.  She is cleared for outpatient follow-up.   Laurence Aly, MD    Note: This note was generated in part or whole with voice recognition software. Voice recognition is usually quite accurate but there are transcription errors that can and very often do occur. I apologize for any typographical errors that were not detected and corrected.     Earleen Newport, MD 10/31/18 [id number]

## 2018-10-31 NOTE — ED Notes (Signed)
Pt assisted to toilet at this time 

## 2018-11-16 ENCOUNTER — Other Ambulatory Visit: Payer: Self-pay | Admitting: Cardiovascular Disease

## 2018-11-17 NOTE — Telephone Encounter (Signed)
Please schedule overdue 6 mo F/U with Dr. Rockey Situ. Thank you!

## 2018-11-28 DIAGNOSIS — H353212 Exudative age-related macular degeneration, right eye, with inactive choroidal neovascularization: Secondary | ICD-10-CM | POA: Diagnosis not present

## 2018-11-29 ENCOUNTER — Other Ambulatory Visit: Payer: Self-pay | Admitting: Cardiovascular Disease

## 2018-12-06 DIAGNOSIS — I10 Essential (primary) hypertension: Secondary | ICD-10-CM | POA: Diagnosis not present

## 2018-12-06 DIAGNOSIS — I209 Angina pectoris, unspecified: Secondary | ICD-10-CM | POA: Diagnosis not present

## 2018-12-06 DIAGNOSIS — I2510 Atherosclerotic heart disease of native coronary artery without angina pectoris: Secondary | ICD-10-CM | POA: Diagnosis not present

## 2018-12-06 DIAGNOSIS — F3340 Major depressive disorder, recurrent, in remission, unspecified: Secondary | ICD-10-CM | POA: Diagnosis not present

## 2018-12-06 DIAGNOSIS — E7800 Pure hypercholesterolemia, unspecified: Secondary | ICD-10-CM | POA: Diagnosis not present

## 2018-12-12 NOTE — Telephone Encounter (Signed)
Done

## 2019-01-02 ENCOUNTER — Ambulatory Visit: Payer: Medicare Other | Admitting: Cardiovascular Disease

## 2019-01-11 ENCOUNTER — Other Ambulatory Visit: Payer: Self-pay | Admitting: Geriatric Medicine

## 2019-01-11 DIAGNOSIS — N6323 Unspecified lump in the left breast, lower outer quadrant: Secondary | ICD-10-CM

## 2019-01-17 DIAGNOSIS — E7800 Pure hypercholesterolemia, unspecified: Secondary | ICD-10-CM | POA: Diagnosis not present

## 2019-01-17 DIAGNOSIS — I209 Angina pectoris, unspecified: Secondary | ICD-10-CM | POA: Diagnosis not present

## 2019-01-17 DIAGNOSIS — I10 Essential (primary) hypertension: Secondary | ICD-10-CM | POA: Diagnosis not present

## 2019-01-17 DIAGNOSIS — I2510 Atherosclerotic heart disease of native coronary artery without angina pectoris: Secondary | ICD-10-CM | POA: Diagnosis not present

## 2019-01-17 DIAGNOSIS — F3340 Major depressive disorder, recurrent, in remission, unspecified: Secondary | ICD-10-CM | POA: Diagnosis not present

## 2019-01-19 ENCOUNTER — Ambulatory Visit
Admission: RE | Admit: 2019-01-19 | Discharge: 2019-01-19 | Disposition: A | Discharge status: Home / Self Care | Payer: Medicare Other | Source: Ambulatory Visit | Attending: Geriatric Medicine | Admitting: Geriatric Medicine

## 2019-01-19 DIAGNOSIS — N6323 Unspecified lump in the left breast, lower outer quadrant: Secondary | ICD-10-CM

## 2019-01-24 NOTE — Progress Notes (Signed)
I Cardiology Office Note  Date:  01/25/2019   ID:  [name], DOB Jan 27, 1930, MRN [id number]  PCP:  Audrey Manes, MD   Chief Complaint  Patient presents with  . office visit    6 mo F/U; Meds verbally reviewed with patient.    HPI:  [name] is a 86 y.o. woman with  CAD,  prior PCI of the LAD in 2001 with non DES, PCI of mid LCX with non des  chronic chest pain episodes,   cardiac catheterization April 2014 showing occluded RCA, patent LAD and left circumflex and  50% carotid disease on the left.  Hospital December 2018 with chest pain, non-ST elevation MI Cardiac catheterization with critical left main disease, three-vessel disease Underwent CABG x3 Postop day 5 developed atrial fibrillation, given amiodarone, normal sinus rhythm restored LIMA to the LAD, vein graft to PDA, vein graft to OM Long history of GI issues, diarrhea, She presents today for follow-up of her coronary artery disease, frequent PVCs, hypertension, carotid arterial disease   Overall feels well, sedentary Rare sharp pain, "nothing new" From lung surgery  Lab work reviewed HAB1C 5.6 Total cho  121, LDL 41 Nonsmoker  Leg weakness Walks in house, Denies any chest pain or shortness of breath Reports blood pressure well controlled Anxiety not an issue  EKG personally reviewed by myself on todays visit Shows normal sinus rhythm rate 61 bpm nonspecific ST abnormality   Other past medical history reviewed January 2020 admission to the hospital with chest pain, epigastric pain Hospital records reviewed with the patient in detail Evaluated by cardiology during her admission, symptoms felt to be GI in etiology, possibly related to gallbladder  In February 2020 she was noted to have gallbladder disease, stones for which she underwent ERCP.   echocardiogram November 2019 reviewed with her. It showed ejection fraction of 55 - 60%   emergency room 07/03/2017 for hypertension Blood pressure [id number]  systolic at home  anxious  Long history of GI issues, diarrhea, has to take extra potassium daily Most recent potassium above 3.5 at the end of December 2016  Chronic leg weakness, does her ADLs, does not work in the garden anymore, unable to get up if she is on the ground   PMH:   has a past medical history of Anxiety, Atypical carcinoid lung tumor ([name]), CAD (coronary artery disease), Cancer ([place]) (yrs ago her pt), Carotid disease, bilateral ([name]), Cataracts, bilateral, Cholangitis, Choledocholithiasis, Cholelithiases, Chronic diarrhea, Depression, Frequent PVCs, Gilbert's syndrome, Headache, History of echocardiogram, HTN (hypertension), Hyperlipidemia, Impaired fasting blood sugar, Keloid, Macular degeneration, Osteoarthritis, Pneumonia, PONV (postoperative nausea and vomiting), Positional vertigo, Postoperative atrial fibrillation ([place]) (12/2016), S/P CABG x 3 (12/23/2016), TIA (transient ischemic attack), Wears glasses, and Wears hearing aid.  PSH:    Past Surgical History:  Procedure Laterality Date  . ABDOMINAL AORTOGRAM N/A 12/22/2016   Procedure: ABDOMINAL AORTOGRAM;  Surgeon: Audrey Bush, MD;  Location: [place] [facility];  Service: Cardiovascular;  Laterality: N/A;  . ABDOMINAL HYSTERECTOMY    . BILIARY STENT PLACEMENT  12/10/2017   Procedure: BILIARY STENT PLACEMENT;  Surgeon: [name]., MD;  Location: [place];  Service: Gastroenterology;;  . CHOLECYSTECTOMY    . CORONARY ARTERY BYPASS GRAFT N/A 12/23/2016   Procedure: CORONARY ARTERY BYPASS GRAFTING (CABG) TIMES THREE USING LEFT INTERNAL MAMMARY ARTERY AND LEFT SAPHENOUS LEG VEIN HARVESTED ENDOSCOPICALLY,;  Surgeon: Audrey Alberts, MD;  Location: [place];  Service: Open Heart Surgery;  Laterality: N/A;  . ENDOSCOPIC RETROGRADE  CHOLANGIOPANCREATOGRAPHY (ERCP) WITH PROPOFOL N/A 02/21/2018   Procedure: ENDOSCOPIC RETROGRADE CHOLANGIOPANCREATOGRAPHY (ERCP) WITH PROPOFOL;  Surgeon: [name].,  MD;  Location: [place];  Service: Gastroenterology;  Laterality: N/A;  . ERCP N/A 12/10/2017   Procedure: ENDOSCOPIC RETROGRADE CHOLANGIOPANCREATOGRAPHY (ERCP);  Surgeon: [name]., MD;  Location: [place];  Service: Gastroenterology;  Laterality: N/A;  . IABP INSERTION N/A 12/22/2016   Procedure: IABP Insertion;  Surgeon: Audrey Bush, MD;  Location: [place] [facility];  Service: Cardiovascular;  Laterality: N/A;  . LEFT HEART CATH AND CORONARY ANGIOGRAPHY N/A 12/22/2016   Procedure: LEFT HEART CATH AND CORONARY ANGIOGRAPHY;  Surgeon: Audrey Bush, MD;  Location: [place] [facility];  Service: Cardiovascular;  Laterality: N/A;  . LEFT HEART CATHETERIZATION WITH CORONARY ANGIOGRAM N/A 04/27/2012   Procedure: LEFT HEART CATHETERIZATION WITH CORONARY ANGIOGRAM;  Surgeon: Audrey M Martinique, MD;  Location: [facility] CATH LAB;  Service: Cardiovascular;  Laterality: N/A;  . LOBECTOMY Right    RLL atypical carcinoid tumor - [name]  . pci  1986  . PCI of LAD  2001   with non des lad   . pci of mid cfx     with non des and cutting balloon pci of av circumflex  . REMOVAL OF STONES  02/21/2018   Procedure: REMOVAL OF STONES;  Surgeon: [name]., MD;  Location: [place];  Service: Gastroenterology;;  . [name]  02/21/2018   Procedure: [name];  Surgeon: [name]., MD;  Location: [place];  Service: Gastroenterology;;  . [name] REMOVAL  02/21/2018   Procedure: STENT REMOVAL;  Surgeon: [name]., MD;  Location: [place];  Service: Gastroenterology;;  . TEE WITHOUT CARDIOVERSION N/A 12/23/2016   Procedure: TRANSESOPHAGEAL ECHOCARDIOGRAM (TEE);  Surgeon: Audrey Alberts, MD;  Location: [place];  Service: Open Heart Surgery;  Laterality: N/A;    Current Outpatient Medications  Medication Sig Dispense Refill  . acetaminophen (TYLENOL) 325 MG tablet Take 2 tablets (650 mg total) by mouth every 6 (six) hours as needed  for mild pain or headache.    Marland Kitchen amLODipine (NORVASC) 10 MG tablet Take 1 tablet (10 mg total) by mouth daily. (Patient taking differently: Take 5 mg by mouth daily. )    . aspirin 81 MG tablet Take 81 mg by mouth daily.    Marland Kitchen atorvastatin (LIPITOR) 80 MG tablet Take 1 tablet (80 mg total) by mouth daily.    . calcium carbonate (TUMS EX) 750 MG chewable tablet Chew 2 tablets by mouth as needed for heartburn.    . Calcium Carbonate-Vitamin D (CALCIUM 600 + D PO) Take 1 tablet by mouth daily. 600 mg/ 400 mg    . cephALEXin (KEFLEX) 500 MG capsule Take 500 mg by mouth 4 (four) times daily.    Marland Kitchen estrogens, conjugated, (PREMARIN) 0.3 MG tablet Take 0.3 mg by mouth daily. Take daily for 21 days then do not take for 7 days.    . furosemide (LASIX) 40 MG tablet Take 1 tablet (40 mg total) by mouth daily. 90 tablet 3  . IRON PO Take 65 mg by mouth.    Marland Kitchen KLOR-CON M20 20 MEQ tablet TAKE 1 TABLET (20 MEQ TOTAL) BY MOUTH 2 (TWO) TIMES DAILY. *NEEDS OFFICE VISIT FOR FURTHER REFILLS-PLEASE CALL [phone] TO SCHEDULE.* 180 tablet 0  . LORazepam (ATIVAN) 0.5 MG tablet Take 1 tablet (0.5 mg total) by mouth every 8 (eight) hours as needed for anxiety. 30 tablet 0  . losartan (COZAAR) 100 MG tablet  TAKE 0.5 TABLETS (50 MG TOTAL) BY MOUTH 2 (TWO) TIMES DAILY. 90 tablet 3  . metoprolol succinate (TOPROL-XL) 50 MG 24 hr tablet TAKE 1 TABLET (50 MG TOTAL) BY MOUTH DAILY. TAKE WITH OR IMMEDIATELY FOLLOWING A MEAL. 90 tablet 3  . Multiple Vitamin (MULTIVITAMIN) tablet Take 1 tablet by mouth daily.    . nitroGLYCERIN (NITROSTAT) 0.4 MG SL tablet Place 1 tablet (0.4 mg total) under the tongue every 5 (five) minutes as needed for chest pain. 25 tablet 0  . pantoprazole (PROTONIX) 40 MG tablet Take 40 mg by mouth daily.     No current facility-administered medications for this visit.     Allergies:   Sertraline hcl, Biaxin [clarithromycin], Celecoxib, Ciprofloxacin, Codeine, Hydromorphone, Lisinopril, Macrobid  [nitrofurantoin monohyd macro], Oxycodone hcl, Prednisone, Sertraline hcl, and Penicillins   Social History:  The patient  reports that she has never smoked. She has never used smokeless tobacco. She reports that she does not drink alcohol or use drugs.   Family History:   family history includes CAD in her father and mother; Diabetes Mellitus I in her brother, mother, sister, and sister.    Review of Systems: Review of Systems  Constitutional: Negative.   Eyes: Negative.   Respiratory: Negative.  Negative for shortness of breath.   Cardiovascular: Negative.  Negative for chest pain.  Gastrointestinal: Negative.   Genitourinary: Negative.   Musculoskeletal: Negative.        Leg weakness  Neurological: Negative.   Psychiatric/Behavioral: Negative.   All other systems reviewed and are negative.    PHYSICAL EXAM: VS:  BP 140/70 (BP Location: Left Arm, Patient Position: Sitting, Cuff Size: Normal)   Pulse 61   Ht 5' 6.5" (1.689 m)   Wt 189 lb 4 oz (85.8 kg)   SpO2 98%   BMI 30.09 kg/m  , BMI Body mass index is 30.09 kg/m.   Constitutional:  oriented to person, place, and time. No distress.  HENT:  Head: Grossly normal Eyes:  no discharge. No scleral icterus.  Neck: No JVD, no carotid bruits  Cardiovascular: Regular rate and rhythm, no murmurs appreciated Pulmonary/Chest: Clear to auscultation bilaterally, no wheezes or rails Abdominal: Soft.  no distension.  no tenderness.  Musculoskeletal: Normal range of motion Neurological:  normal muscle tone. Coordination normal. No atrophy Skin: Skin warm and dry Psychiatric: normal affect, pleasant  Recent Labs: 02/03/2018: Magnesium 2.0 10/31/2018: ALT 27; BUN 17; Creatinine, Ser 0.97; Hemoglobin 13.9; Platelets 198; Potassium 3.5; Sodium 139    Lipid Panel Lab Results  Component Value Date   CHOL 121 02/04/2018   HDL 47 02/04/2018   LDLCALC 41 02/04/2018   TRIG 167 (H) 02/04/2018    Wt Readings from Last 3 Encounters:   01/25/19 189 lb 4 oz (85.8 kg)  10/31/18 194 lb (88 kg)  03/31/18 180 lb (81.6 kg)     ASSESSMENT AND PLAN:  CABG Three-vessel bypass surgery November 2018 Denies chest pain concerning for angina Continue current medications  Coronary artery disease involving native coronary artery of native heart with angina pectoris with documented spasm ([place])  Very sedentary at baseline, legs weak, no falls Recommended regular walking program Continue current medications as above  Essential hypertension  continue current medications  PVC's (premature ventricular contractions) Denies palpitations Continue metoprolol  Left carotid stenosis Less than 39% blockage in the carotids Cholesterol at goal    Total encounter time more than 25 minutes  Greater than 50% was spent in counseling and coordination of care  with the patient   Disposition:   F/U  12 months   Orders Placed This Encounter  Procedures  . EKG 12-Lead   I, Jesus Reyes am acting as a scribe for Ida Rogue, M.D., Ph.D.  I, Ida Rogue, M.D. Ph.D., have reviewed the above documentation for accuracy and completeness, and I agree with the above.   Signed, Esmond Plants, M.D., Ph.D. 01/25/2019  [place], [place]

## 2019-01-25 ENCOUNTER — Encounter: Payer: Self-pay | Admitting: Cardiovascular Disease

## 2019-01-25 ENCOUNTER — Ambulatory Visit (INDEPENDENT_AMBULATORY_CARE_PROVIDER_SITE_OTHER): Payer: Medicare Other | Admitting: Cardiovascular Disease

## 2019-01-25 ENCOUNTER — Other Ambulatory Visit: Payer: Self-pay

## 2019-01-25 VITALS — BP 140/70 | HR 61 | Ht 66.5 in | Wt 189.2 lb

## 2019-01-25 DIAGNOSIS — E7800 Pure hypercholesterolemia, unspecified: Secondary | ICD-10-CM

## 2019-01-25 DIAGNOSIS — I10 Essential (primary) hypertension: Secondary | ICD-10-CM | POA: Diagnosis not present

## 2019-01-25 DIAGNOSIS — I25110 Atherosclerotic heart disease of native coronary artery with unstable angina pectoris: Secondary | ICD-10-CM | POA: Diagnosis not present

## 2019-01-25 DIAGNOSIS — Z951 Presence of aortocoronary bypass graft: Secondary | ICD-10-CM | POA: Diagnosis not present

## 2019-01-25 DIAGNOSIS — I6522 Occlusion and stenosis of left carotid artery: Secondary | ICD-10-CM | POA: Diagnosis not present

## 2019-01-25 DIAGNOSIS — R29898 Other symptoms and signs involving the musculoskeletal system: Secondary | ICD-10-CM

## 2019-01-25 NOTE — Patient Instructions (Signed)

## 2019-02-16 ENCOUNTER — Other Ambulatory Visit: Payer: Self-pay | Admitting: Cardiovascular Disease

## 2019-02-25 ENCOUNTER — Other Ambulatory Visit: Payer: Self-pay | Admitting: Cardiovascular Disease

## 2019-03-28 DIAGNOSIS — H353122 Nonexudative age-related macular degeneration, left eye, intermediate dry stage: Secondary | ICD-10-CM | POA: Diagnosis not present

## 2019-04-07 DIAGNOSIS — E7800 Pure hypercholesterolemia, unspecified: Secondary | ICD-10-CM | POA: Diagnosis not present

## 2019-04-07 DIAGNOSIS — F3340 Major depressive disorder, recurrent, in remission, unspecified: Secondary | ICD-10-CM | POA: Diagnosis not present

## 2019-04-07 DIAGNOSIS — I209 Angina pectoris, unspecified: Secondary | ICD-10-CM | POA: Diagnosis not present

## 2019-04-07 DIAGNOSIS — I2510 Atherosclerotic heart disease of native coronary artery without angina pectoris: Secondary | ICD-10-CM | POA: Diagnosis not present

## 2019-04-07 DIAGNOSIS — I10 Essential (primary) hypertension: Secondary | ICD-10-CM | POA: Diagnosis not present

## 2019-04-18 DIAGNOSIS — I700 Atherosclerosis of aorta: Secondary | ICD-10-CM | POA: Diagnosis not present

## 2019-04-18 DIAGNOSIS — I272 Pulmonary hypertension, unspecified: Secondary | ICD-10-CM | POA: Diagnosis not present

## 2019-04-18 DIAGNOSIS — E7800 Pure hypercholesterolemia, unspecified: Secondary | ICD-10-CM | POA: Diagnosis not present

## 2019-04-18 DIAGNOSIS — I10 Essential (primary) hypertension: Secondary | ICD-10-CM | POA: Diagnosis not present

## 2019-04-18 DIAGNOSIS — I340 Nonrheumatic mitral (valve) insufficiency: Secondary | ICD-10-CM | POA: Diagnosis not present

## 2019-04-18 DIAGNOSIS — Z79899 Other long term (current) drug therapy: Secondary | ICD-10-CM | POA: Diagnosis not present

## 2019-05-12 ENCOUNTER — Other Ambulatory Visit: Payer: Self-pay | Admitting: Cardiovascular Disease

## 2019-05-18 DIAGNOSIS — Z23 Encounter for immunization: Secondary | ICD-10-CM | POA: Diagnosis not present

## 2019-05-18 DIAGNOSIS — F3340 Major depressive disorder, recurrent, in remission, unspecified: Secondary | ICD-10-CM | POA: Diagnosis not present

## 2019-05-18 DIAGNOSIS — E7800 Pure hypercholesterolemia, unspecified: Secondary | ICD-10-CM | POA: Diagnosis not present

## 2019-05-18 DIAGNOSIS — I10 Essential (primary) hypertension: Secondary | ICD-10-CM | POA: Diagnosis not present

## 2019-05-18 DIAGNOSIS — I2510 Atherosclerotic heart disease of native coronary artery without angina pectoris: Secondary | ICD-10-CM | POA: Diagnosis not present

## 2019-05-18 DIAGNOSIS — I209 Angina pectoris, unspecified: Secondary | ICD-10-CM | POA: Diagnosis not present

## 2019-06-06 ENCOUNTER — Other Ambulatory Visit: Payer: Self-pay | Admitting: Cardiovascular Disease

## 2019-06-08 DIAGNOSIS — Z23 Encounter for immunization: Secondary | ICD-10-CM | POA: Diagnosis not present

## 2019-08-02 DIAGNOSIS — I2510 Atherosclerotic heart disease of native coronary artery without angina pectoris: Secondary | ICD-10-CM | POA: Diagnosis not present

## 2019-08-02 DIAGNOSIS — I10 Essential (primary) hypertension: Secondary | ICD-10-CM | POA: Diagnosis not present

## 2019-08-02 DIAGNOSIS — I209 Angina pectoris, unspecified: Secondary | ICD-10-CM | POA: Diagnosis not present

## 2019-08-02 DIAGNOSIS — F3340 Major depressive disorder, recurrent, in remission, unspecified: Secondary | ICD-10-CM | POA: Diagnosis not present

## 2019-08-02 DIAGNOSIS — E7800 Pure hypercholesterolemia, unspecified: Secondary | ICD-10-CM | POA: Diagnosis not present

## 2019-08-06 ENCOUNTER — Other Ambulatory Visit: Payer: Self-pay | Admitting: Internal Medicine

## 2019-08-14 DIAGNOSIS — H353122 Nonexudative age-related macular degeneration, left eye, intermediate dry stage: Secondary | ICD-10-CM | POA: Diagnosis not present

## 2019-09-06 DIAGNOSIS — I209 Angina pectoris, unspecified: Secondary | ICD-10-CM | POA: Diagnosis not present

## 2019-09-06 DIAGNOSIS — E7800 Pure hypercholesterolemia, unspecified: Secondary | ICD-10-CM | POA: Diagnosis not present

## 2019-09-06 DIAGNOSIS — F3340 Major depressive disorder, recurrent, in remission, unspecified: Secondary | ICD-10-CM | POA: Diagnosis not present

## 2019-09-06 DIAGNOSIS — I2510 Atherosclerotic heart disease of native coronary artery without angina pectoris: Secondary | ICD-10-CM | POA: Diagnosis not present

## 2019-09-06 DIAGNOSIS — I10 Essential (primary) hypertension: Secondary | ICD-10-CM | POA: Diagnosis not present

## 2019-09-12 ENCOUNTER — Other Ambulatory Visit: Payer: Self-pay | Admitting: Cardiovascular Disease

## 2019-10-02 DIAGNOSIS — N1831 Chronic kidney disease, stage 3a: Secondary | ICD-10-CM | POA: Diagnosis not present

## 2019-10-02 DIAGNOSIS — I2510 Atherosclerotic heart disease of native coronary artery without angina pectoris: Secondary | ICD-10-CM | POA: Diagnosis not present

## 2019-10-02 DIAGNOSIS — I209 Angina pectoris, unspecified: Secondary | ICD-10-CM | POA: Diagnosis not present

## 2019-10-02 DIAGNOSIS — F3340 Major depressive disorder, recurrent, in remission, unspecified: Secondary | ICD-10-CM | POA: Diagnosis not present

## 2019-10-02 DIAGNOSIS — I129 Hypertensive chronic kidney disease with stage 1 through stage 4 chronic kidney disease, or unspecified chronic kidney disease: Secondary | ICD-10-CM | POA: Diagnosis not present

## 2019-10-02 DIAGNOSIS — E7800 Pure hypercholesterolemia, unspecified: Secondary | ICD-10-CM | POA: Diagnosis not present

## 2019-10-10 ENCOUNTER — Other Ambulatory Visit: Payer: Self-pay | Admitting: Cardiovascular Disease

## 2019-10-18 DIAGNOSIS — Z1389 Encounter for screening for other disorder: Secondary | ICD-10-CM | POA: Diagnosis not present

## 2019-10-18 DIAGNOSIS — I700 Atherosclerosis of aorta: Secondary | ICD-10-CM | POA: Diagnosis not present

## 2019-10-18 DIAGNOSIS — Z23 Encounter for immunization: Secondary | ICD-10-CM | POA: Diagnosis not present

## 2019-10-18 DIAGNOSIS — Z0000 Encounter for general adult medical examination without abnormal findings: Secondary | ICD-10-CM | POA: Diagnosis not present

## 2019-10-18 DIAGNOSIS — I272 Pulmonary hypertension, unspecified: Secondary | ICD-10-CM | POA: Diagnosis not present

## 2019-10-18 DIAGNOSIS — Z79899 Other long term (current) drug therapy: Secondary | ICD-10-CM | POA: Diagnosis not present

## 2019-10-18 DIAGNOSIS — I129 Hypertensive chronic kidney disease with stage 1 through stage 4 chronic kidney disease, or unspecified chronic kidney disease: Secondary | ICD-10-CM | POA: Diagnosis not present

## 2019-10-18 DIAGNOSIS — E7800 Pure hypercholesterolemia, unspecified: Secondary | ICD-10-CM | POA: Diagnosis not present

## 2019-10-18 DIAGNOSIS — F3340 Major depressive disorder, recurrent, in remission, unspecified: Secondary | ICD-10-CM | POA: Diagnosis not present

## 2019-10-18 DIAGNOSIS — N1831 Chronic kidney disease, stage 3a: Secondary | ICD-10-CM | POA: Diagnosis not present

## 2019-10-18 DIAGNOSIS — I351 Nonrheumatic aortic (valve) insufficiency: Secondary | ICD-10-CM | POA: Diagnosis not present

## 2019-10-18 DIAGNOSIS — I10 Essential (primary) hypertension: Secondary | ICD-10-CM | POA: Diagnosis not present

## 2019-10-25 DIAGNOSIS — I129 Hypertensive chronic kidney disease with stage 1 through stage 4 chronic kidney disease, or unspecified chronic kidney disease: Secondary | ICD-10-CM | POA: Diagnosis not present

## 2019-10-25 DIAGNOSIS — E7800 Pure hypercholesterolemia, unspecified: Secondary | ICD-10-CM | POA: Diagnosis not present

## 2019-10-25 DIAGNOSIS — I2510 Atherosclerotic heart disease of native coronary artery without angina pectoris: Secondary | ICD-10-CM | POA: Diagnosis not present

## 2019-10-25 DIAGNOSIS — N1831 Chronic kidney disease, stage 3a: Secondary | ICD-10-CM | POA: Diagnosis not present

## 2019-10-25 DIAGNOSIS — I209 Angina pectoris, unspecified: Secondary | ICD-10-CM | POA: Diagnosis not present

## 2019-10-25 DIAGNOSIS — F3340 Major depressive disorder, recurrent, in remission, unspecified: Secondary | ICD-10-CM | POA: Diagnosis not present

## 2019-11-10 ENCOUNTER — Other Ambulatory Visit: Payer: Self-pay | Admitting: Cardiovascular Disease

## 2019-11-10 NOTE — Telephone Encounter (Signed)
Rx request sent to pharmacy.  

## 2019-11-28 DIAGNOSIS — N1831 Chronic kidney disease, stage 3a: Secondary | ICD-10-CM | POA: Diagnosis not present

## 2019-11-28 DIAGNOSIS — I129 Hypertensive chronic kidney disease with stage 1 through stage 4 chronic kidney disease, or unspecified chronic kidney disease: Secondary | ICD-10-CM | POA: Diagnosis not present

## 2019-11-28 DIAGNOSIS — E7800 Pure hypercholesterolemia, unspecified: Secondary | ICD-10-CM | POA: Diagnosis not present

## 2019-11-28 DIAGNOSIS — I209 Angina pectoris, unspecified: Secondary | ICD-10-CM | POA: Diagnosis not present

## 2019-11-28 DIAGNOSIS — I2510 Atherosclerotic heart disease of native coronary artery without angina pectoris: Secondary | ICD-10-CM | POA: Diagnosis not present

## 2019-11-28 DIAGNOSIS — F3340 Major depressive disorder, recurrent, in remission, unspecified: Secondary | ICD-10-CM | POA: Diagnosis not present

## 2019-11-28 DIAGNOSIS — I272 Pulmonary hypertension, unspecified: Secondary | ICD-10-CM | POA: Diagnosis not present

## 2019-12-04 ENCOUNTER — Emergency Department: Payer: Medicare Other

## 2019-12-04 ENCOUNTER — Emergency Department
Admission: EM | Admit: 2019-12-04 | Discharge: 2019-12-04 | Disposition: A | Discharge status: Home / Self Care | Payer: Medicare Other | Attending: Emergency Medicine | Admitting: Emergency Medicine

## 2019-12-04 ENCOUNTER — Encounter: Payer: Self-pay | Admitting: Medical Oncology

## 2019-12-04 ENCOUNTER — Other Ambulatory Visit: Payer: Self-pay

## 2019-12-04 DIAGNOSIS — Z7982 Long term (current) use of aspirin: Secondary | ICD-10-CM | POA: Diagnosis not present

## 2019-12-04 DIAGNOSIS — R222 Localized swelling, mass and lump, trunk: Secondary | ICD-10-CM | POA: Diagnosis not present

## 2019-12-04 DIAGNOSIS — R0789 Other chest pain: Secondary | ICD-10-CM | POA: Insufficient documentation

## 2019-12-04 DIAGNOSIS — R52 Pain, unspecified: Secondary | ICD-10-CM | POA: Diagnosis not present

## 2019-12-04 DIAGNOSIS — R079 Chest pain, unspecified: Secondary | ICD-10-CM | POA: Diagnosis not present

## 2019-12-04 DIAGNOSIS — I2510 Atherosclerotic heart disease of native coronary artery without angina pectoris: Secondary | ICD-10-CM | POA: Diagnosis not present

## 2019-12-04 DIAGNOSIS — I10 Essential (primary) hypertension: Secondary | ICD-10-CM | POA: Diagnosis not present

## 2019-12-04 DIAGNOSIS — Z853 Personal history of malignant neoplasm of breast: Secondary | ICD-10-CM | POA: Insufficient documentation

## 2019-12-04 DIAGNOSIS — M47814 Spondylosis without myelopathy or radiculopathy, thoracic region: Secondary | ICD-10-CM | POA: Diagnosis not present

## 2019-12-04 DIAGNOSIS — Z79899 Other long term (current) drug therapy: Secondary | ICD-10-CM | POA: Insufficient documentation

## 2019-12-04 DIAGNOSIS — N644 Mastodynia: Secondary | ICD-10-CM | POA: Diagnosis not present

## 2019-12-04 DIAGNOSIS — I25110 Atherosclerotic heart disease of native coronary artery with unstable angina pectoris: Secondary | ICD-10-CM | POA: Diagnosis not present

## 2019-12-04 DIAGNOSIS — Z951 Presence of aortocoronary bypass graft: Secondary | ICD-10-CM | POA: Insufficient documentation

## 2019-12-04 DIAGNOSIS — J984 Other disorders of lung: Secondary | ICD-10-CM | POA: Diagnosis not present

## 2019-12-04 LAB — COMPREHENSIVE METABOLIC PANEL
ALT: 24 U/L (ref 0–44)
AST: 30 U/L (ref 15–41)
Albumin: 4.1 g/dL (ref 3.5–5.0)
Alkaline Phosphatase: 89 U/L (ref 38–126)
Anion gap: 9 (ref 5–15)
BUN: 15 mg/dL (ref 8–23)
CO2: 28 mmol/L (ref 22–32)
Calcium: 9.3 mg/dL (ref 8.9–10.3)
Chloride: 103 mmol/L (ref 98–111)
Creatinine, Ser: 0.85 mg/dL (ref 0.44–1.00)
GFR, Estimated: >60 mL/min (ref >60)
Glucose, Bld: 135 mg/dL — ABNORMAL HIGH (ref 70–99)
Potassium: 3.8 mmol/L (ref 3.5–5.1)
Sodium: 140 mmol/L (ref 135–145)
Total Bilirubin: 1.7 mg/dL — ABNORMAL HIGH (ref 0.3–1.2)
Total Protein: 7.2 g/dL (ref 6.5–8.1)

## 2019-12-04 LAB — CBC
HCT: 39.5 % (ref 36.0–46.0)
Hemoglobin: 13.1 g/dL (ref 12.0–15.0)
MCH: 30.2 pg (ref 26.0–34.0)
MCHC: 33.2 g/dL (ref 30.0–36.0)
MCV: 91 fL (ref 80.0–100.0)
Platelets: 177 10*3/uL (ref 150–400)
RBC: 4.34 MIL/uL (ref 3.87–5.11)
RDW: 12.5 % (ref 11.5–15.5)
WBC: 5.1 10*3/uL (ref 4.0–10.5)
nRBC: 0 % (ref 0.0–0.2)

## 2019-12-04 LAB — TROPONIN I (HIGH SENSITIVITY)
Troponin I (High Sensitivity): 6 ng/L (ref <18)
Troponin I (High Sensitivity): 7 ng/L (ref <18)

## 2019-12-04 MED ORDER — TRAMADOL HCL 50 MG PO TABS
50.0000 mg | ORAL_TABLET | Freq: Three times a day (TID) | ORAL | 0 refills | Status: DC | PRN
Start: 2019-12-04 — End: 2020-01-03

## 2019-12-04 MED ORDER — IOHEXOL 300 MG/ML  SOLN
75.0000 mL | Freq: Once | INTRAMUSCULAR | Status: AC | PRN
  Administered 2019-12-04: 75 mL via INTRAVENOUS

## 2019-12-04 MED ORDER — TRAMADOL HCL 50 MG PO TABS
50.0000 mg | ORAL_TABLET | ORAL | Status: AC
  Administered 2019-12-04: 50 mg via ORAL
  Filled 2019-12-04: qty 1

## 2019-12-04 NOTE — ED Triage Notes (Signed)
Pt reports that since Thursday she has been having pain to a certain area of her breast. States that when she touches it there is pain. Pt denies lump or abscess to area. Pt in NAD at this time.

## 2019-12-04 NOTE — ED Triage Notes (Signed)
FIRST NURSE: Pt to ER via EMS for left breast pain since Thursday.  Reports of pain increases with movement.  EMS reports VS WNL and EKG WNL.

## 2019-12-04 NOTE — ED Provider Notes (Addendum)
ECG reviewed briefly for triage purposes.  Patient evidently complaining of breast pain.  Her ECG does have abnormalities that could be concerning for ischemia, no STEMI however.  Patient will be triaged with priority   Delman Kitten, MD 12/04/19 1112   ----------------------------------------- 2:11 PM on 12/04/2019 -----------------------------------------  I was able to later make comparison to a more recent ECG performed at the cardiology clinic, and I note that her abnormalities appear chronic in nature and less acute than originally thought   Delman Kitten, MD 12/04/19 1412

## 2019-12-04 NOTE — ED Provider Notes (Signed)
[facility] Emergency Department Provider Note   ____________________________________________   First MD Initiated Contact with Patient 12/04/19 1311     (approximate)  I have reviewed the triage vital signs and the nursing notes.   HISTORY  Chief Complaint Breast Pain    HPI [name] is a 86 y.o. female the history of coronary disease, lung cancer, carcinoid tumor, depression, anxiety,  Patient has had a previous coronary bypass.  Patient reports that since Thursday she has been having a sharp somewhat burning stinging pain in 1 small spot just in the middle of her breastbone.  It seems to be right at an area where she has her old scar at the bottom of her bypass site.  She reports that the pain goes away if she sits still but when she is moving about or presses over the area she gets a sharp stinging pain.  It does radiate out slightly towards her left breast when this occurs  She has not noticed any rash redness or swelling no fevers or chills.  The pain is very associated with movement she reports that she lifts her left arm or pushes over her breastbone the pain comes  She has not taken any medication.  This has been intermittent with movement and pressing on the area since Thursday.  No trouble breathing.  No shortness of breath.  No leg swelling.   Past Medical History:  Diagnosis Date  . Anxiety   . Atypical carcinoid lung tumor ([place])   . CAD (coronary artery disease)    a. 1986: s/p PTCA of bifurcation lesion in LAD;  b. 2001: s/p BMS to LAD & LCX;  c. 04/2006 Cath: diff CAD w/o critical narrowing, nl EF;  d. 12/2014 MV: inflat ST dep, mild basal inf ischemia, EF 55-65%-->Low risk; e. 12/2016 - 3V CABG.  . Cancer [facility]) yrs ago her pt   right lobe of lung. surgery only.   . Carotid disease, bilateral ([place])    a. 08/2015 Carotid U/S: < 39% bilat ICA stenosis.  . Cataracts, bilateral   . Cholangitis   . Choledocholithiasis   . Cholelithiases    . Chronic diarrhea   . Depression   . Frequent PVCs   . Gilbert's syndrome   . Headache    PMH: migraines  . History of echocardiogram    a. 06/2010 Echo: EF 60-65%, mild AI/MR; b. 10/2015 Echo: EF 60-65%, no rwma, mild MR, nl RV fxn.  Marland Kitchen HTN (hypertension)   . Hyperlipidemia   . Impaired fasting blood sugar   . Keloid    mid sternum  . Macular degeneration   . Osteoarthritis   . Pneumonia   . PONV (postoperative nausea and vomiting)   . Positional vertigo    benign  . Postoperative atrial fibrillation ([place]) 12/2016  . S/P CABG x 3 12/23/2016   LIMA to LAD, SVG to OM, SVG to PDA, EVH via left thigh  . TIA (transient ischemic attack)   . Wears glasses   . Wears hearing aid     Patient Active Problem List   Diagnosis Date Noted  . History of ERCP 01/25/2018  . History of biliary stent insertion 01/25/2018  . Coronary artery disease involving native coronary artery of native heart without angina pectoris   . Acute cholangitis 12/08/2017  . Elevated troponin 12/08/2017  . Choledocholithiasis 12/08/2017  . S/P CABG x 3 12/23/2016  . On intra-aortic balloon pump assist   . NSTEMI (non-ST  elevated myocardial infarction) ([place]) 12/22/2016  . Weakness of lower extremity 12/09/2015  . Viral URI 11/07/2015  . Hypokalemia 11/07/2015  . Cough   . Diaphoresis   . Sore throat   . Non-ST elevation (NSTEMI) myocardial infarction ([place]) 12/29/2014  . Chest pain 12/28/2014  . Angina pectoris ([place])   . Coronary artery disease involving native coronary artery of native heart with unstable angina pectoris ([place])   . Emesis   . Gastroenteritis, acute   . PVC's (premature ventricular contractions) 04/25/2012  . Cardiac arrest ([place])   . Left carotid stenosis 03/01/2012  . Leg fatigue 03/01/2012  . Dysuria 03/30/2011  . Mitral regurgitation 03/30/2011  . HYPERCHOLESTEROLEMIA  IIA 08/06/2008  . Carotid bruit 05/28/2008  . Essential hypertension 05/11/2008  . Coronary atherosclerosis  05/11/2008  . Angina at rest [facility]) 05/11/2008    Past Surgical History:  Procedure Laterality Date  . ABDOMINAL AORTOGRAM N/A 12/22/2016   Procedure: ABDOMINAL AORTOGRAM;  Surgeon: Nelva Bush, MD;  Location: [place] [facility];  Service: Cardiovascular;  Laterality: N/A;  . ABDOMINAL HYSTERECTOMY    . BILIARY STENT PLACEMENT  12/10/2017   Procedure: BILIARY STENT PLACEMENT;  Surgeon: Irving Copas., MD;  Location: [place];  Service: Gastroenterology;;  . CHOLECYSTECTOMY    . CORONARY ARTERY BYPASS GRAFT N/A 12/23/2016   Procedure: CORONARY ARTERY BYPASS GRAFTING (CABG) TIMES THREE USING LEFT INTERNAL MAMMARY ARTERY AND LEFT SAPHENOUS LEG VEIN HARVESTED ENDOSCOPICALLY,;  Surgeon: Rexene Alberts, MD;  Location: [place];  Service: Open Heart Surgery;  Laterality: N/A;  . ENDOSCOPIC RETROGRADE CHOLANGIOPANCREATOGRAPHY (ERCP) WITH PROPOFOL N/A 02/21/2018   Procedure: ENDOSCOPIC RETROGRADE CHOLANGIOPANCREATOGRAPHY (ERCP) WITH PROPOFOL;  Surgeon: Rush Landmark Telford Nab., MD;  Location: [place];  Service: Gastroenterology;  Laterality: N/A;  . ERCP N/A 12/10/2017   Procedure: ENDOSCOPIC RETROGRADE CHOLANGIOPANCREATOGRAPHY (ERCP);  Surgeon: Irving Copas., MD;  Location: [place];  Service: Gastroenterology;  Laterality: N/A;  . IABP INSERTION N/A 12/22/2016   Procedure: IABP Insertion;  Surgeon: Nelva Bush, MD;  Location: [place] [facility];  Service: Cardiovascular;  Laterality: N/A;  . LEFT HEART CATH AND CORONARY ANGIOGRAPHY N/A 12/22/2016   Procedure: LEFT HEART CATH AND CORONARY ANGIOGRAPHY;  Surgeon: Nelva Bush, MD;  Location: [place] [facility];  Service: Cardiovascular;  Laterality: N/A;  . LEFT HEART CATHETERIZATION WITH CORONARY ANGIOGRAM N/A 04/27/2012   Procedure: LEFT HEART CATHETERIZATION WITH CORONARY ANGIOGRAM;  Surgeon: Peter M Martinique, MD;  Location: [facility] CATH [facility];  Service: Cardiovascular;  Laterality: N/A;  . LOBECTOMY Right     RLL atypical carcinoid tumor - Dr Arlyce Dice  . pci  1986  . PCI of LAD  2001   with non des lad   . pci of mid cfx     with non des and cutting balloon pci of av circumflex  . REMOVAL OF STONES  02/21/2018   Procedure: REMOVAL OF STONES;  Surgeon: Rush Landmark Telford Nab., MD;  Location: [place];  Service: Gastroenterology;;  . Joan Mayans  02/21/2018   Procedure: Joan Mayans;  Surgeon: Irving Copas., MD;  Location: [place];  Service: Gastroenterology;;  . Lavell Islam REMOVAL  02/21/2018   Procedure: STENT REMOVAL;  Surgeon: Irving Copas., MD;  Location: [place];  Service: Gastroenterology;;  . TEE WITHOUT CARDIOVERSION N/A 12/23/2016   Procedure: TRANSESOPHAGEAL ECHOCARDIOGRAM (TEE);  Surgeon: Rexene Alberts, MD;  Location: [place];  Service: Open Heart Surgery;  Laterality: N/A;    Prior to Admission medications   Medication Sig Start Date End  Date Taking? Authorizing Provider  acetaminophen (TYLENOL) 325 MG tablet Take 2 tablets (650 mg total) by mouth every 6 (six) hours as needed for mild pain or headache. 01/02/17   Barrett, Erin R, PA-C  amLODipine (NORVASC) 10 MG tablet Take 1 tablet (10 mg total) by mouth daily. Patient taking differently: Take 5 mg by mouth daily.  01/02/17   Barrett, Lodema Hong, PA-C  aspirin 81 MG tablet Take 81 mg by mouth daily.    [provider]  atorvastatin (LIPITOR) 80 MG tablet Take 1 tablet (80 mg total) by mouth daily. 01/02/17   Barrett, Erin R, PA-C  calcium carbonate (TUMS EX) 750 MG chewable tablet Chew 2 tablets by mouth as needed for heartburn.    [provider]  Calcium Carbonate-Vitamin D (CALCIUM 600 + D PO) Take 1 tablet by mouth daily. 600 mg/ 400 mg    [provider]  cephALEXin (KEFLEX) 500 MG capsule Take 500 mg by mouth 4 (four) times daily.    [provider]  estrogens, conjugated, (PREMARIN) 0.3 MG tablet Take 0.3 mg by mouth daily. Take daily for 21 days then do not take  for 7 days.    [provider]  furosemide (LASIX) 40 MG tablet TAKE 1 TABLET BY MOUTH EVERY DAY 05/12/19   Minna Merritts, MD  IRON PO Take 65 mg by mouth.    [provider]  KLOR-CON M20 20 MEQ tablet TAKE 1 TABLET BY MOUTH TWICE A DAY 09/12/19   Gollan, Kathlene November, MD  LORazepam (ATIVAN) 0.5 MG tablet Take 1 tablet (0.5 mg total) by mouth every 8 (eight) hours as needed for anxiety. 01/02/17   Barrett, Erin R, PA-C  losartan (COZAAR) 100 MG tablet TAKE 0.5 TABLETS (50 MG TOTAL) BY MOUTH 2 (TWO) TIMES DAILY. 11/10/19   Minna Merritts, MD  metoprolol succinate (TOPROL-XL) 50 MG 24 hr tablet TAKE 1 TABLET (50 MG TOTAL) BY MOUTH DAILY. TAKE WITH OR IMMEDIATELY FOLLOWING A MEAL. 02/16/19   Minna Merritts, MD  Multiple Vitamin (MULTIVITAMIN) tablet Take 1 tablet by mouth daily.    [provider]  nitroGLYCERIN (NITROSTAT) 0.4 MG SL tablet PLACE 1 TABLET (0.4 MG TOTAL) UNDER THE TONGUE EVERY 5 (FIVE) MINUTES AS NEEDED FOR CHEST PAIN. 08/07/19   End, Harrell Gave, MD  pantoprazole (PROTONIX) 40 MG tablet Take 40 mg by mouth daily. 12/20/17   [provider]  traMADol (ULTRAM) 50 MG tablet Take 1 tablet (50 mg total) by mouth every 8 (eight) hours as needed for moderate pain or severe pain (pain along breastbone). 12/04/19   Delman Kitten, MD    Allergies Sertraline hcl, Biaxin [clarithromycin], Celecoxib, Ciprofloxacin, Codeine, Hydromorphone, Lisinopril, Macrobid [nitrofurantoin monohyd macro], Oxycodone hcl, Prednisone, Sertraline hcl, and Penicillins  Family History  Problem Relation Age of [place]  . Diabetes Mellitus I Mother   . CAD Mother   . CAD Father   . Diabetes Mellitus I Sister   . Diabetes Mellitus I Brother   . Diabetes Mellitus I Sister   . Colon cancer Neg Hx   . Esophageal cancer Neg Hx   . Inflammatory bowel disease Neg Hx   . Liver disease Neg Hx   . Pancreatic cancer Neg Hx   . Rectal cancer Neg Hx   . Stomach cancer Neg Hx      Social History Social History   Tobacco Use  . Smoking status: Never Smoker  . Smokeless tobacco: Never Used  Vaping Use  .  Vaping Use: Never used  Substance Use Topics  . Alcohol use: No  . Drug use: No    Review of Systems Constitutional: No fever/chills Cardiovascular: see HPI.  No pain that she would describe as tightness.  Does not radiate into her arm or neck Respiratory: Denies shortness of breath. Gastrointestinal: No abdominal pain.   Genitourinary: Negative for dysuria. Musculoskeletal: Negative for back pain. Skin: Negative for rash. Neurological: Negative for headaches, areas of focal weakness or numbness.    ____________________________________________   PHYSICAL EXAM:  VITAL SIGNS: ED Triage Vitals [12/04/19 1027]  Enc Vitals Group     BP (!) 141/68     Pulse Rate 65     Resp 16     Temp 98.2 F (36.8 C)     Temp Source Oral     SpO2 97 %     Weight 197 lb (89.4 kg)     Height 5\' 6"  (1.676 m)     Head Circumference      Peak Flow      Pain Score 10     Pain Loc      Pain Edu?      Excl. in [place]?     Constitutional: Alert and oriented. Well appearing and in no acute distress.  Patient is here with her grandson.  Both very pleasant and calm Eyes: Conjunctivae are normal. Head: Atraumatic. Nose: No congestion/rhinnorhea. Mouth/Throat: Mucous membranes are moist. Neck: No stridor.  Cardiovascular: Normal rate, regular rhythm. Grossly normal heart sounds.  Good peripheral circulation.  She has a large old previous sternotomy site with what appears to be some slight pounding of scarlike tissue at the inferior border.  There is no induration redness or warmth.  She does report endpoints and has a reproducible tenderness without noted skin lesion just lateral of her midline sternum.  There is no rash or lesion.  Underneath the breast tissue she has a old scar that she reports from a previous cyst that was treated for infection in the past  distantly Respiratory: Normal respiratory effort.  No retractions. Lungs CTAB. Gastrointestinal: Soft and nontender. No distention. Musculoskeletal: No lower extremity tenderness nor edema. Neurologic:  Normal speech and language. No gross focal neurologic deficits are appreciated.  Skin:  Skin is warm, dry and intact. No rash noted. Psychiatric: Mood and affect are normal. Speech and behavior are normal.  ____________________________________________   LABS (all labs ordered are listed, but only abnormal results are displayed)  Labs Reviewed  COMPREHENSIVE METABOLIC PANEL - Abnormal; Notable for the following components:      Result Value   Glucose, Bld 135 (*)    Total Bilirubin 1.7 (*)    All other components within normal limits  CBC  TROPONIN I (HIGH SENSITIVITY)  TROPONIN I (HIGH SENSITIVITY)   ____________________________________________  EKG  Reviewed inter by me at 11 AM Heart rate 70 QRS 85 QTc 440 Normal sinus rhythm, mild ST segment inversion in inferior and lateral distribution _As compared with previous EKG from Dr. Donivan Scull office on January 13 of this year I do not see significant change ___________________________________________  RADIOLOGY  DG Chest 2 View  Result Date: 12/04/2019 CLINICAL DATA:  86 year old female with chest pain.  Palpable pain. EXAM: CHEST - 2 VIEW COMPARISON:  Chest CT 07/08/2016. Radiographs 02/03/2018 and earlier. FINDINGS: Calcified aortic atherosclerosis. Prior CABG. Cardiac size and mediastinal contours are stable and within normal limits. Visualized tracheal air column is within normal limits. No pneumothorax, pulmonary edema, pleural effusion  or acute pulmonary opacity. Mild chronic pleural scarring suspected at the right costophrenic angle. Mild apical scarring. No acute osseous abnormality identified. Mild upper thoracic superior endplate compression is stable. Negative visible bowel gas pattern. IMPRESSION: 1. No acute  cardiopulmonary abnormality. 2.  Aortic Atherosclerosis (ICD10-I70.0). Electronically Signed   By: Genevie Ann M.D.   On: 12/04/2019 11:57   CT Chest W Contrast  Result Date: 12/04/2019 CLINICAL DATA:  Left-sided chest wall pain, initial encounter EXAM: CT CHEST WITH CONTRAST TECHNIQUE: Multidetector CT imaging of the chest was performed during intravenous contrast administration. CONTRAST:  36mL OMNIPAQUE IOHEXOL 300 MG/ML  SOLN COMPARISON:  07/08/2016 CT, chest x-ray from earlier in the same day. FINDINGS: Cardiovascular: No cardiac enlargement is noted. Coronary calcifications are seen as well as changes of prior coronary bypass grafting. Aortic atherosclerotic calcifications are seen without aneurysmal dilatation. The pulmonary artery as visualized is within normal limits. Mediastinum/Nodes: Thoracic inlet is unremarkable. No hilar or mediastinal adenopathy is noted. The esophagus as visualized is within normal limits. Lungs/Pleura: Mild scarring is noted in the bases bilaterally. No focal infiltrate or sizable effusion is seen. No sizable parenchymal nodules are noted. Changes of prior right lower lobectomy stable from the previous exam. Upper Abdomen: Pneumobilia is noted likely related to prior sphincterotomy. Gallbladder has been surgically removed. Scattered calcifications are noted within the spleen stable from the previous exam. Musculoskeletal: Mild degenerative changes of the thoracic spine are seen. No rib abnormality is noted. Changes of prior median sternotomy are seen. In the area of clinical concern is the inferior most sternal wire although no areas of inflammatory change or focal soft tissue mass is seen. No other focal abnormality is noted. IMPRESSION: Changes consistent with prior right lower lobectomy. Pneumobilia likely related to prior sphincterotomy. The area of clinical concern lies adjacent to the inferior most sternal wire although no focal area of inflammatory change with soft tissue  mass is seen. No other focal abnormality is seen. Electronically Signed   By: Inez Catalina M.D.   On: 12/04/2019 14:31    Chest x-ray reviewed negative for acute abnormalities.  CT reviewed, do not see evidence of acute concern on CT imaging.  Prior lobectomy right lower lobe.  The area of concern does correlate with inferior sternal wire region. ____________________________________________   PROCEDURES  Procedure(s) performed: None  Procedures  Critical Care performed: No  ____________________________________________   INITIAL IMPRESSION / ASSESSMENT AND PLAN / ED COURSE  Pertinent labs & imaging results that were available during my care of the patient were reviewed by me and considered in my medical decision making (see chart for details).   Differential diagnosis includes, but is not limited to, ACS, aortic dissection, pulmonary embolism, cardiac tamponade, pneumothorax, pneumonia, pericarditis, myocarditis, GI-related causes including esophagitis/gastritis, and musculoskeletal chest wall pain.  In her instance it seems that her pain is quite reproducible on limited to a point over the inferior sternal border at the base of her sternotomy site.  I do not see evidence of infection induration or lesion.  I question if this could be related to scar to showing, chondritis, or musculoskeletal etiology.  Her first troponin and ECG are quite reassuring and her symptoms are extremely atypical of ACS.  No shortness of breath no hypoxia no tachycardia, no signs or symptoms suggest pulmonary embolism.  With shared medical decision making and discussion of goals of care with the patient and her grandson we will proceed with CT scan to further evaluate for etiology for area  of possible abnormality around her surgical scar.  If this is negative I would anticipate discharge.  Patient willing to trial Ultram for pain, does report nausea with some prescription pain medications in the past.     HEAR  Score: 5   Patient repeat troponin reassuring.  Patient pain is improved, alert oriented after Ultram.  Will prescribe brief prescription, discussed not to utilize this while using her lorazepam due to concerns for sedation.  Patient and grandson understanding.  Patient to follow-up closely, advised follow-up with her cardiac surgeon at [facility] seems reasonable, and the patient agreeable.  I suspect this may be musculoskeletal or potentially related to her sternotomy incision.  Return precautions and treatment recommendations and follow-up discussed with the patient who is agreeable with the plan.   ____________________________________________   FINAL CLINICAL IMPRESSION(S) / ED DIAGNOSES  Final diagnoses:  Chest pain, atypical  Musculoskeletal chest pain        Note:  This document was prepared using Dragon voice recognition software and may include unintentional dictation errors       Delman Kitten, MD 12/04/19 1532

## 2019-12-06 IMAGING — DX DG CHEST 1V PORT
1 series · 1 of 1 positions shown · non-contrast
Comparison: 07/08/2016

CLINICAL DATA: Epigastric pain

EXAM:
PORTABLE CHEST 1 VIEW

[chest ap]
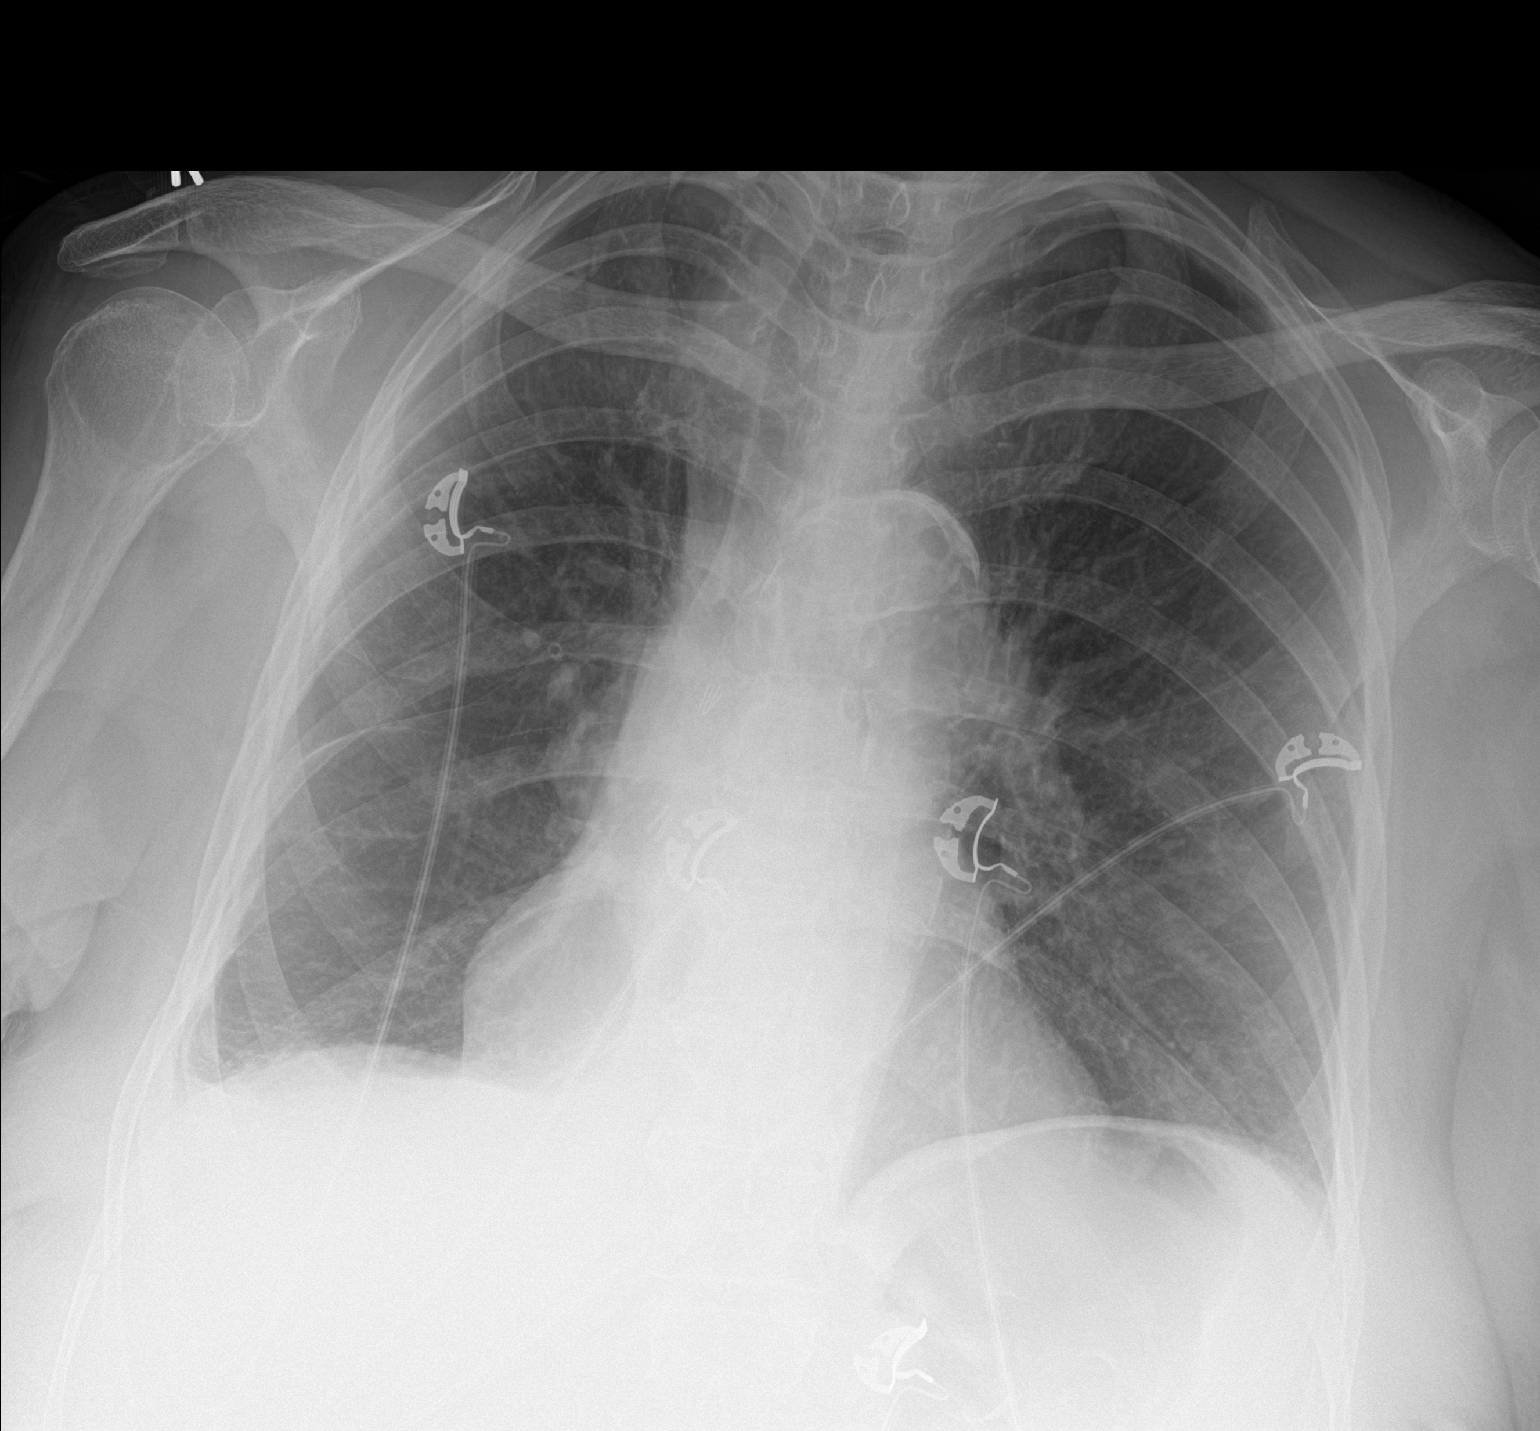

[1 of 1 positions shown; findings below may reference images not displayed]

FINDINGS: Cardiac shadow is stable. Aortic calcifications are again seen. The
lungs are well aerated. Postsurgical changes are noted on the right
with mild volume loss. No acute infiltrate or sizable effusion is
seen. No bony abnormality is noted.
IMPRESSION: Postoperative changes on the right.  No acute abnormality seen.

## 2019-12-06 IMAGING — CT CT ABD-PELV W/ CM
2 of 5 series · 15 of 46 positions shown, 17 images · IV contrast (APPLIED)
Comparison: 07/08/2016

ADDENDUM:
This is an addendum to the original report. The pneumobilia
identified on today's study was present on study from 07/08/2016 but
is a new finding when compared with 10/29/2011. Therefore, this may
reflect a benign process such as pneumobilia due to instrumentation.
Further. in the absence of signs or symptoms of infection this makes
the original consideration for cholangitis less likely.

The filling defects discussed in today's report however appear to be
a new finding from 10/29/2011. I cannot confidently confirm the
these were present on study from 07/08/2016. And MRCP may be helpful
to confirm presence of choledocholithiasis.
These findings were discussed with Dr. Darel Basta at
12/21/2016, [DATE] p.m..
CLINICAL DATA: Abdominal pain and fever.
EXAM:
CT ABDOMEN AND PELVIS WITH CONTRAST
TECHNIQUE: Multidetector CT imaging of the abdomen and pelvis was performed
using the standard protocol following bolus administration of
intravenous contrast.
CONTRAST:  100mL [id number] IOPAMIDOL ([id number]) INJECTION 61%

[Series 2: routine abd/pel with · axial · 0.75mm/px · z∈[-473,-73]mm · 12 of 90 slices shown, 14 images]
[im 5/90  soft-tissue]
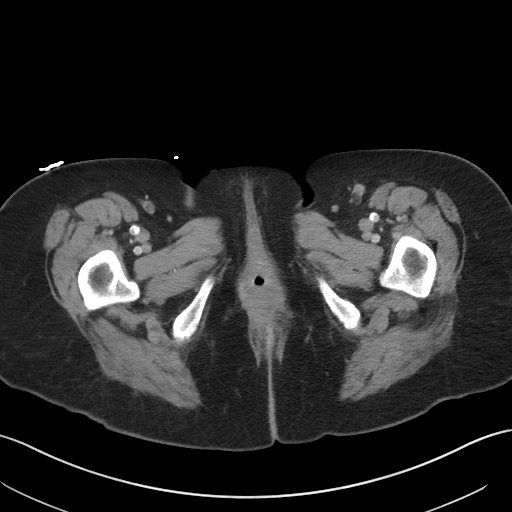
[im 5/90  bone]
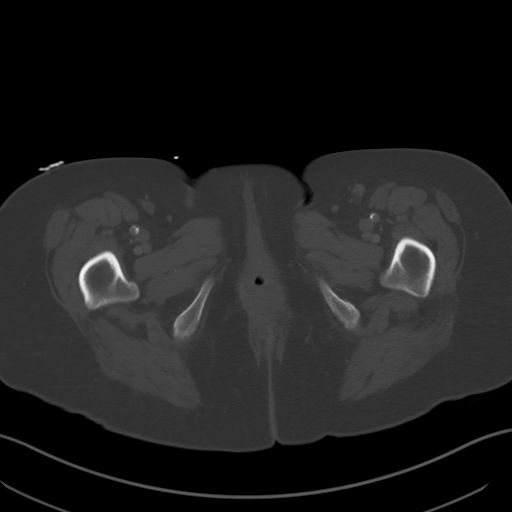
[im 15/90  soft-tissue]
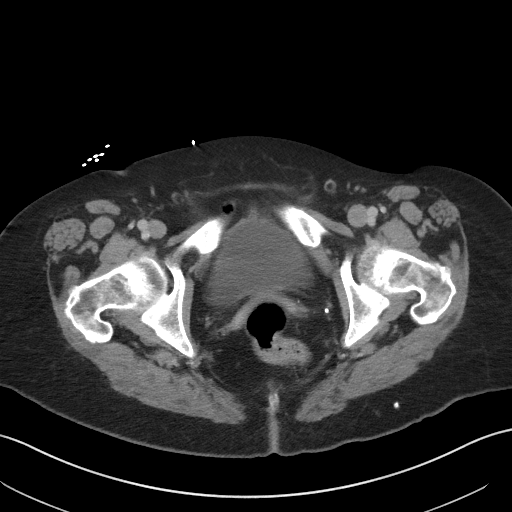
[im 19/90  soft-tissue]
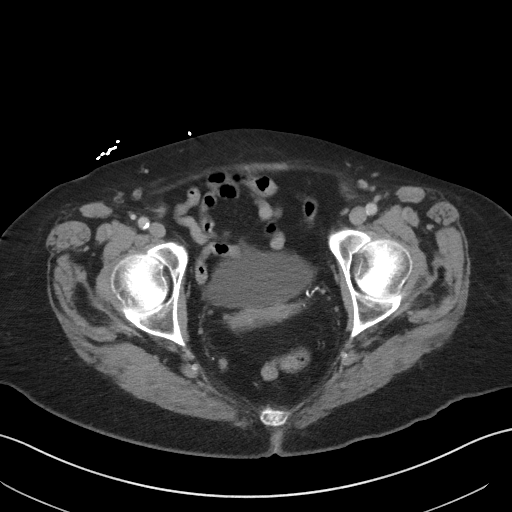
[im 29/90  soft-tissue]
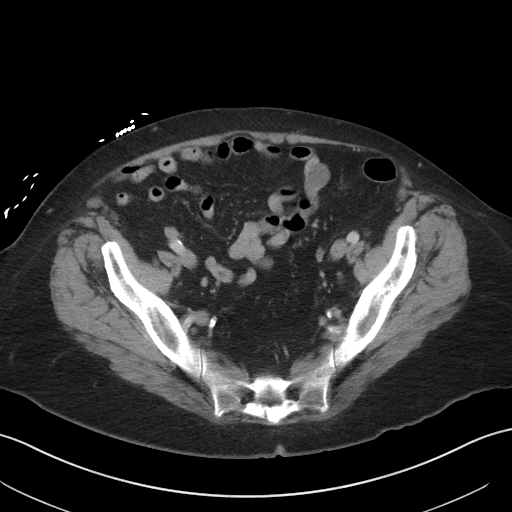
[im 33/90  soft-tissue]
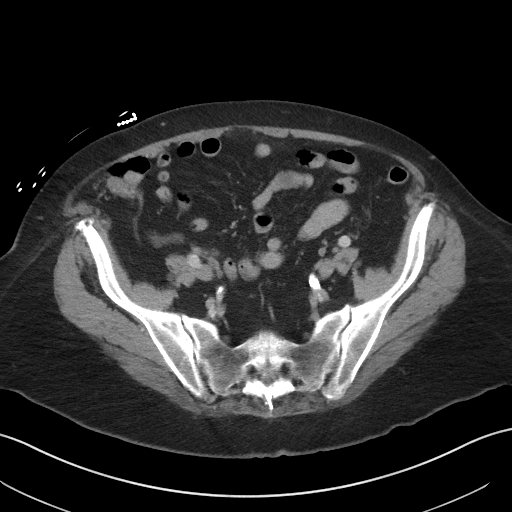
[im 43/90  soft-tissue]
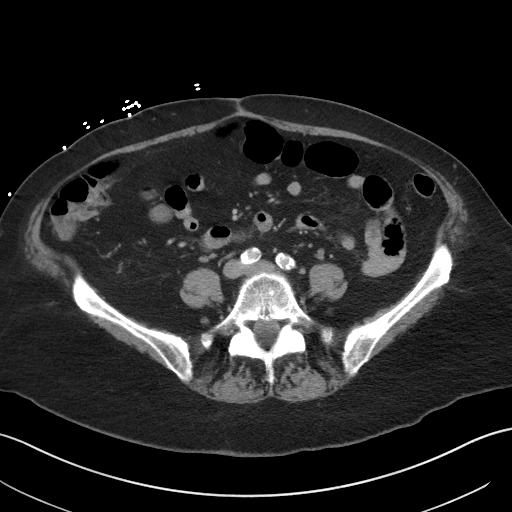
[im 47/90  soft-tissue]
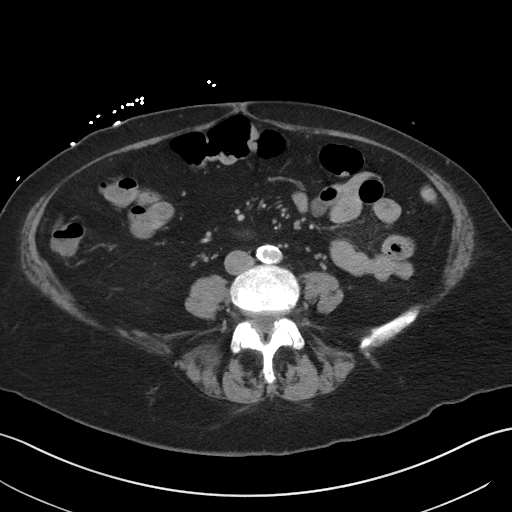
[im 57/90  soft-tissue]
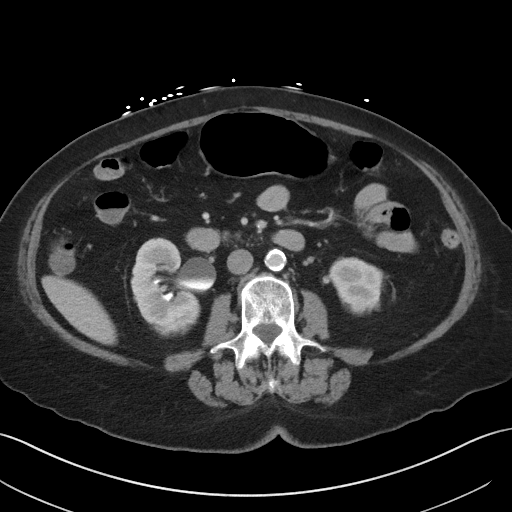
[im 61/90  soft-tissue]
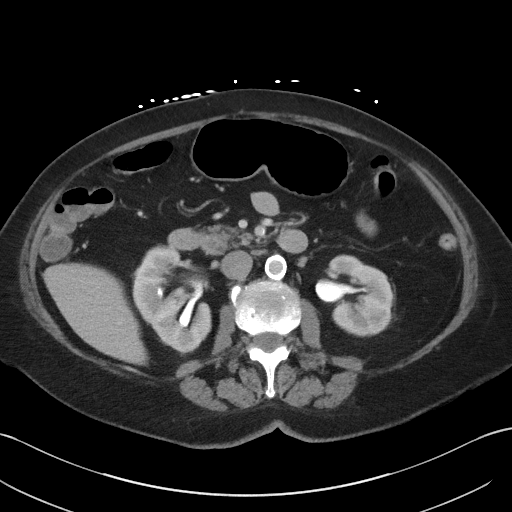
[im 61/90  bone]
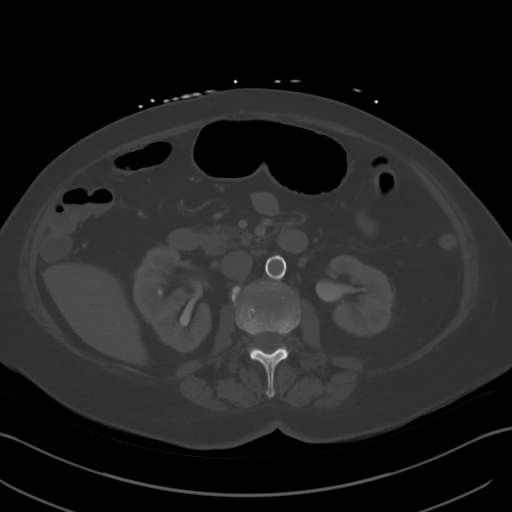
[im 71/90  soft-tissue]
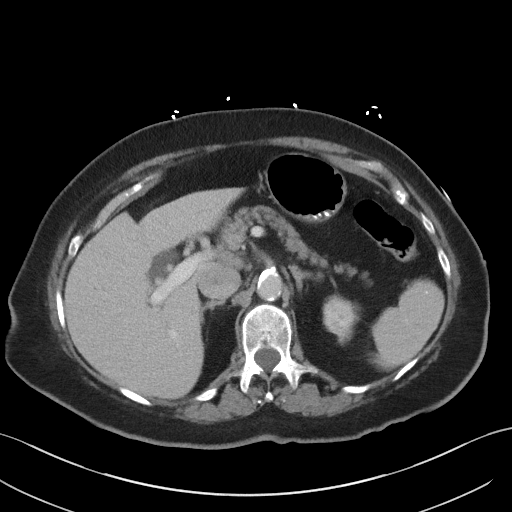
[im 75/90  soft-tissue]
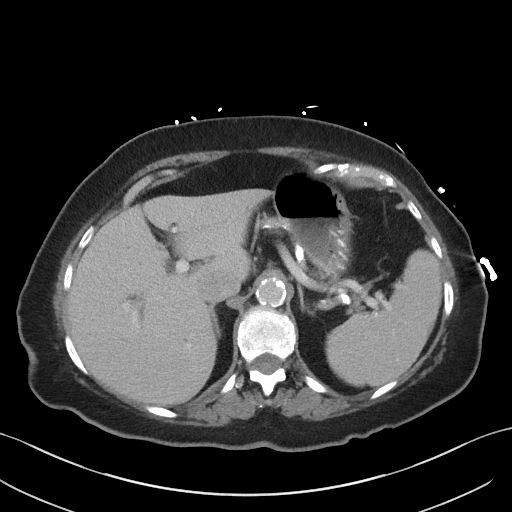
[im 85/90  soft-tissue]
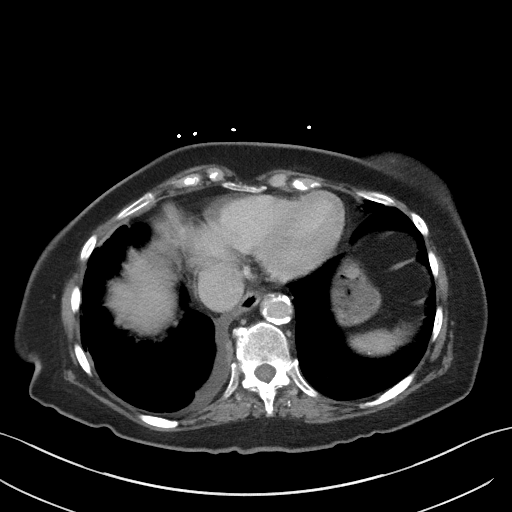

[Series 5: coronal st · coronal · 0.79mm/px · 3 of 93 slices shown]
[im 31/93  soft-tissue]
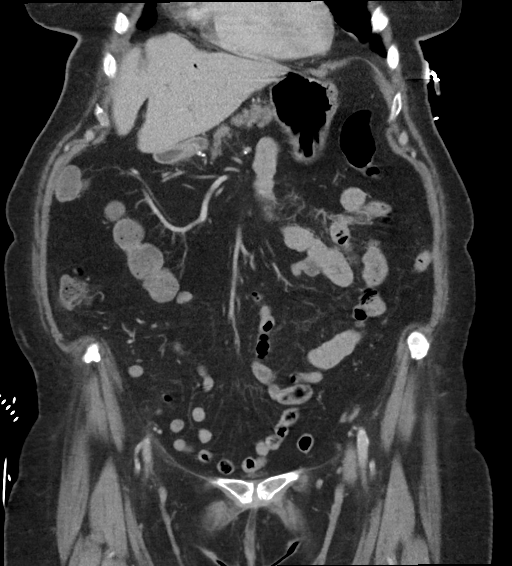
[im 41/93  soft-tissue]
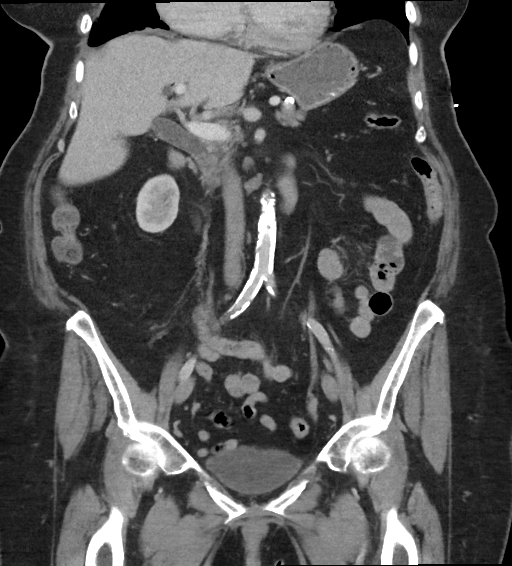
[im 52/93  soft-tissue]
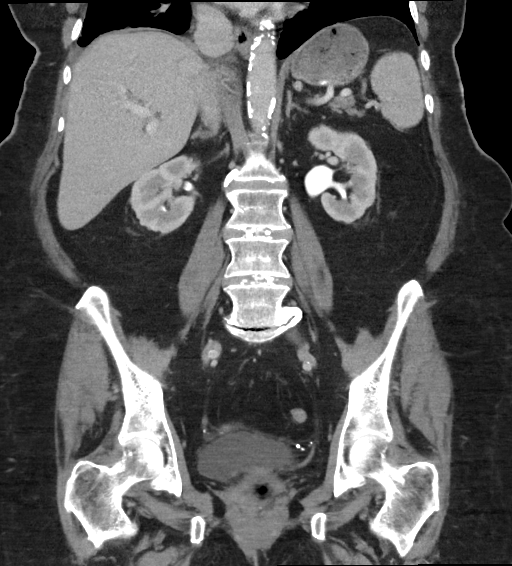

[15 of 46 positions shown; findings below may reference images not displayed]

FINDINGS: Lower chest: There is a small right pleural effusion. Unchanged from
previous exam.

Hepatobiliary: No focal liver abnormality. Pneumobilia is identified
status post cholecystectomy. This is new when compared with
07/08/2016 and 10/29/2011. Soft tissue attenuating filling defects
within the common bile duct are identified which appear new from
previous exam.

Pancreas: Unremarkable. No pancreatic ductal dilatation or
surrounding inflammatory changes.

Spleen: Normal in size without focal abnormality.

Adrenals/Urinary Tract: The adrenal glands appear normal.
Unremarkable appearance of the kidneys. No mass or hydronephrosis.
Urinary bladder is unremarkable.

Stomach/Bowel: The stomach is normal. The small bowel loops have a
normal course and caliber without obstruction. The appendix is not
visualized. No secondary signs of acute appendicitis.

Vascular/Lymphatic: Aortic atherosclerosis. Branch vessel aortic
atherosclerotic calcifications noted. No aneurysm. No adenopathy
within the abdomen or pelvis.

Reproductive: Status post hysterectomy. No adnexal masses.

Other: No free fluid or fluid collections.

Musculoskeletal: Degenerative disc disease noted within the lumbar
spine.
IMPRESSION: 1. Interval development of pneumobilia. There also several soft
tissue attenuating filling defects within the common bile duct which
may represent sequelae of inflammation/ infection or
choledocholithiasis, this is new from 07/08/2016. In the setting of
suspected infection and elevated bilirubin cholangitis cannot be
excluded.
2. No intra-abdominal abscess identified.
3.  Aortic Atherosclerosis ([id number]).
4. Chronic right pleural effusion.

## 2019-12-08 IMAGING — DX DG CHEST 1V PORT
1 series · 1 of 1 positions shown · non-contrast
Comparison: Portable chest x-ray December 22, 2016

CLINICAL DATA: Chest soreness, intra aortic balloon pump placed
yesterday.

EXAM:
PORTABLE CHEST 1 VIEW

[chest ap]
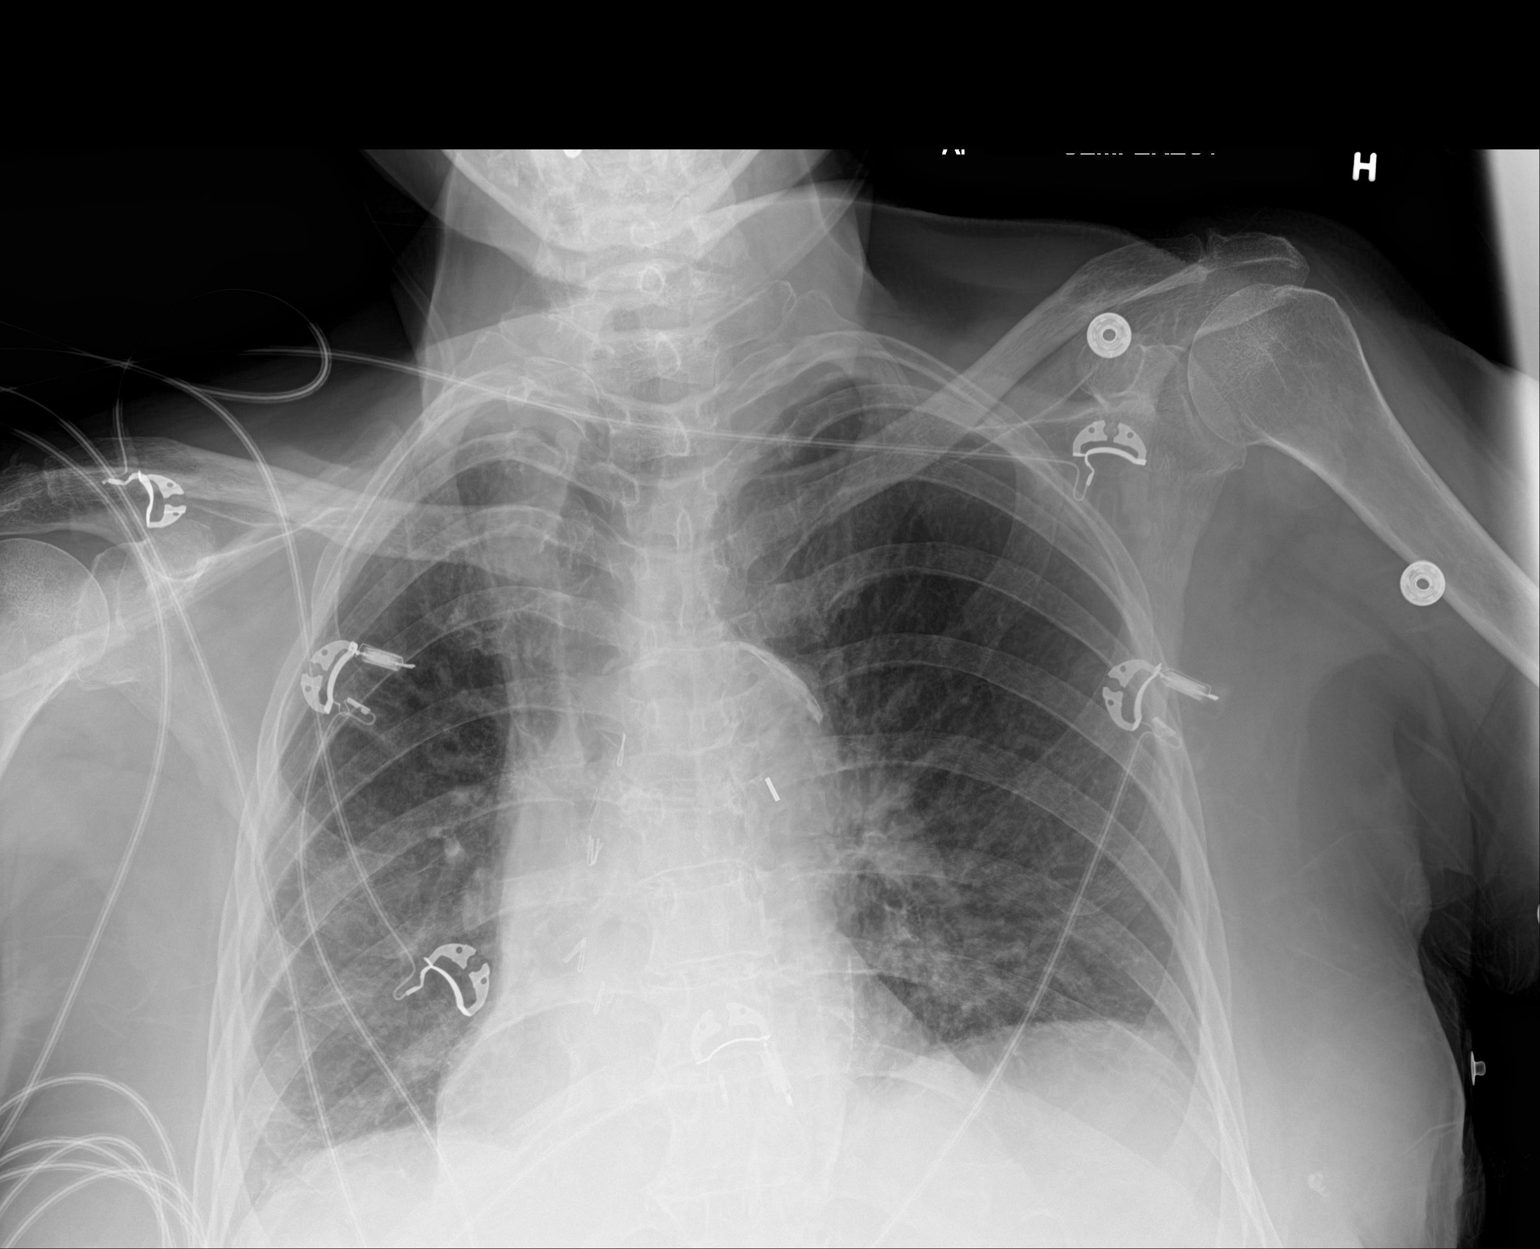

[1 of 1 positions shown; findings below may reference images not displayed]

FINDINGS: Again demonstrated is the IABP marker that projects over inferior
aspect of the aortic arch. The cardiac silhouette is top-normal in
size. The interstitium within the mid and lower aspects of both
lungs is more conspicuous today. There is no alveolar infiltrate,
pleural effusion, or pneumothorax. There is dense calcification in
the wall of the thoracic aorta.
IMPRESSION: Stable positioning of the intra-aortic balloon pump marker.

Mildly increased interstitial markings in the mid and lower lungs
bilaterally may reflect low-grade interstitial edema.

Thoracic aortic atherosclerosis.

## 2019-12-08 IMAGING — DX DG CHEST 1V PORT
1 series · 1 of 1 positions shown · non-contrast
Comparison: Chest radiograph 12/23/2016 at [DATE] a.m.

CLINICAL DATA: Intubation and atelectasis

EXAM:
PORTABLE CHEST 1 VIEW

[chest ap]
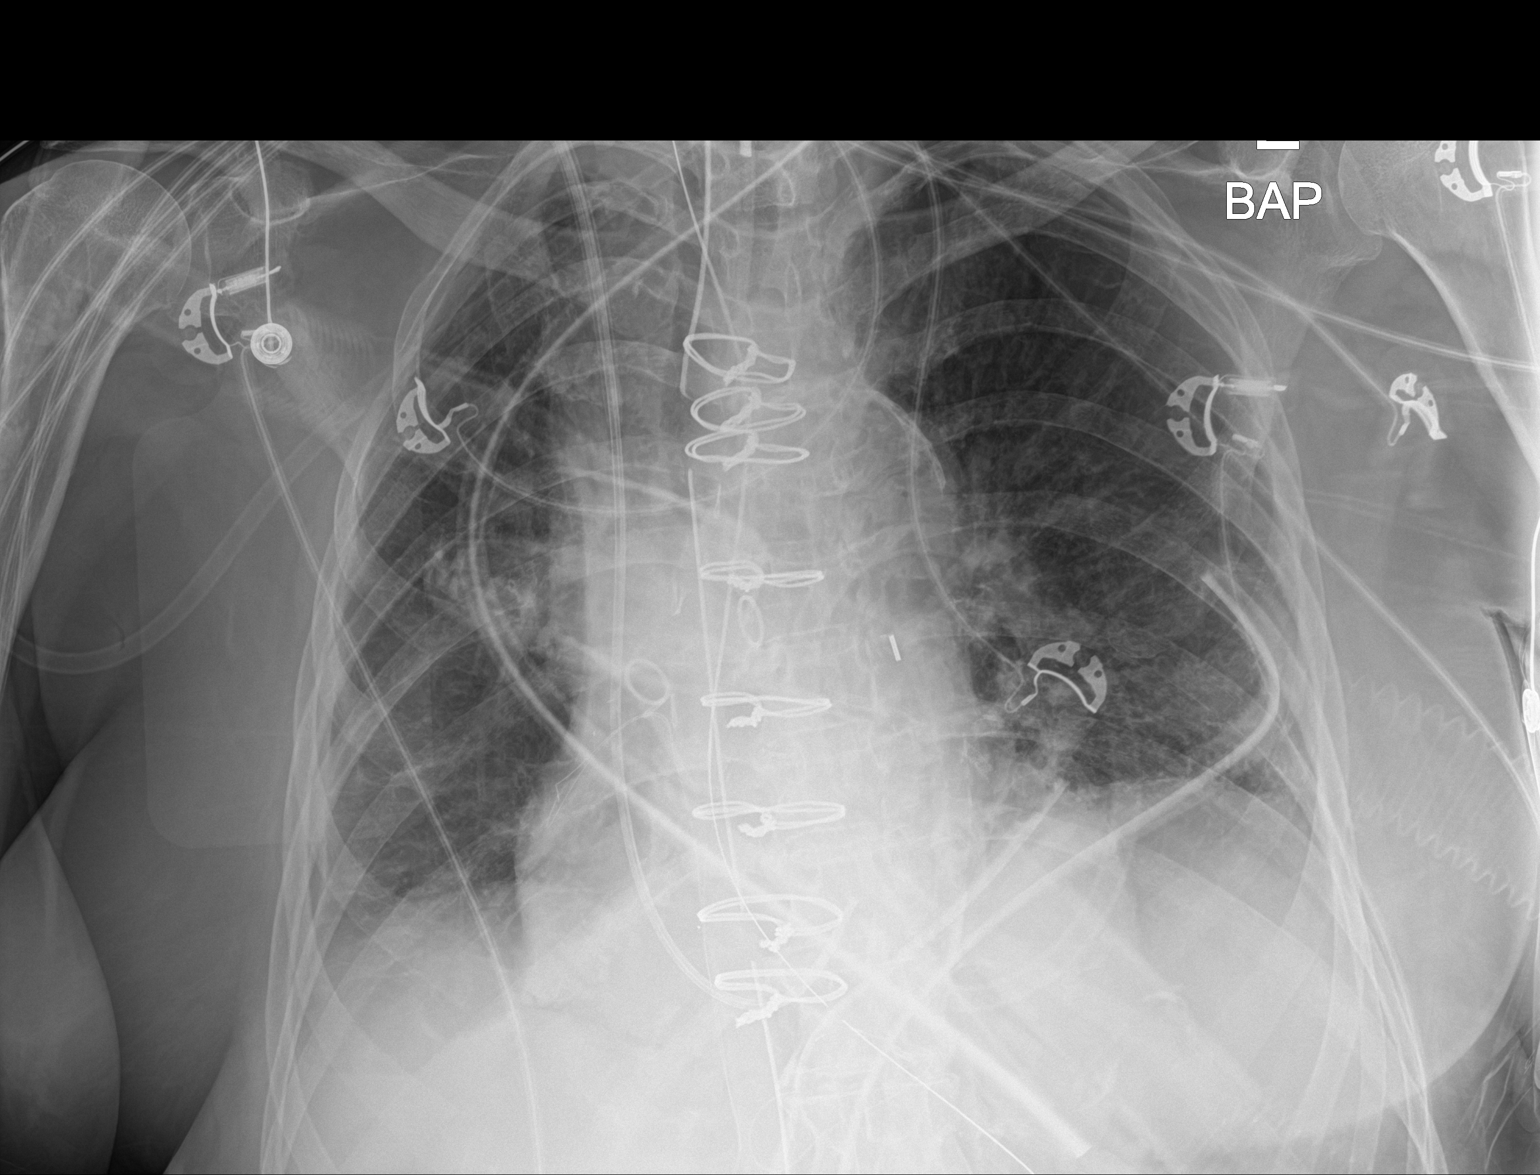

[1 of 1 positions shown; findings below may reference images not displayed]

FINDINGS: Endotracheal tuber tip is 3.1 cm above the inferior carina.
Orogastric tube side port is just below the gastroesophageal
junction. Intra-aortic balloon pump has been slightly retracted with
the marker now slightly more inferior within the descending thoracic
aorta. There is right IJ approach central venous catheter with its
tip likely within the right ventricle.

Status post CABG with new CABG markers, median sternotomy wires and
mediastinal drain. Left chest tube tip near the lateral left
hemithorax.

No focal airspace consolidation or pulmonary edema.
IMPRESSION: 1. Status post CABG with support apparatus as above. Endotracheal
tube tip 3.1 cm above the inferior carina.
2. Right IJ approach catheter tip is probably within the right
ventricle.

## 2019-12-09 ENCOUNTER — Other Ambulatory Visit: Payer: Self-pay | Admitting: Cardiovascular Disease

## 2019-12-09 IMAGING — DX DG CHEST 1V PORT
1 series · 1 of 1 positions shown · non-contrast
Comparison: 12/23/2016 and earlier.

CLINICAL DATA: 86-year-old female postop day 1 status post CABG.

EXAM:
PORTABLE CHEST 1 VIEW

[chest ap]
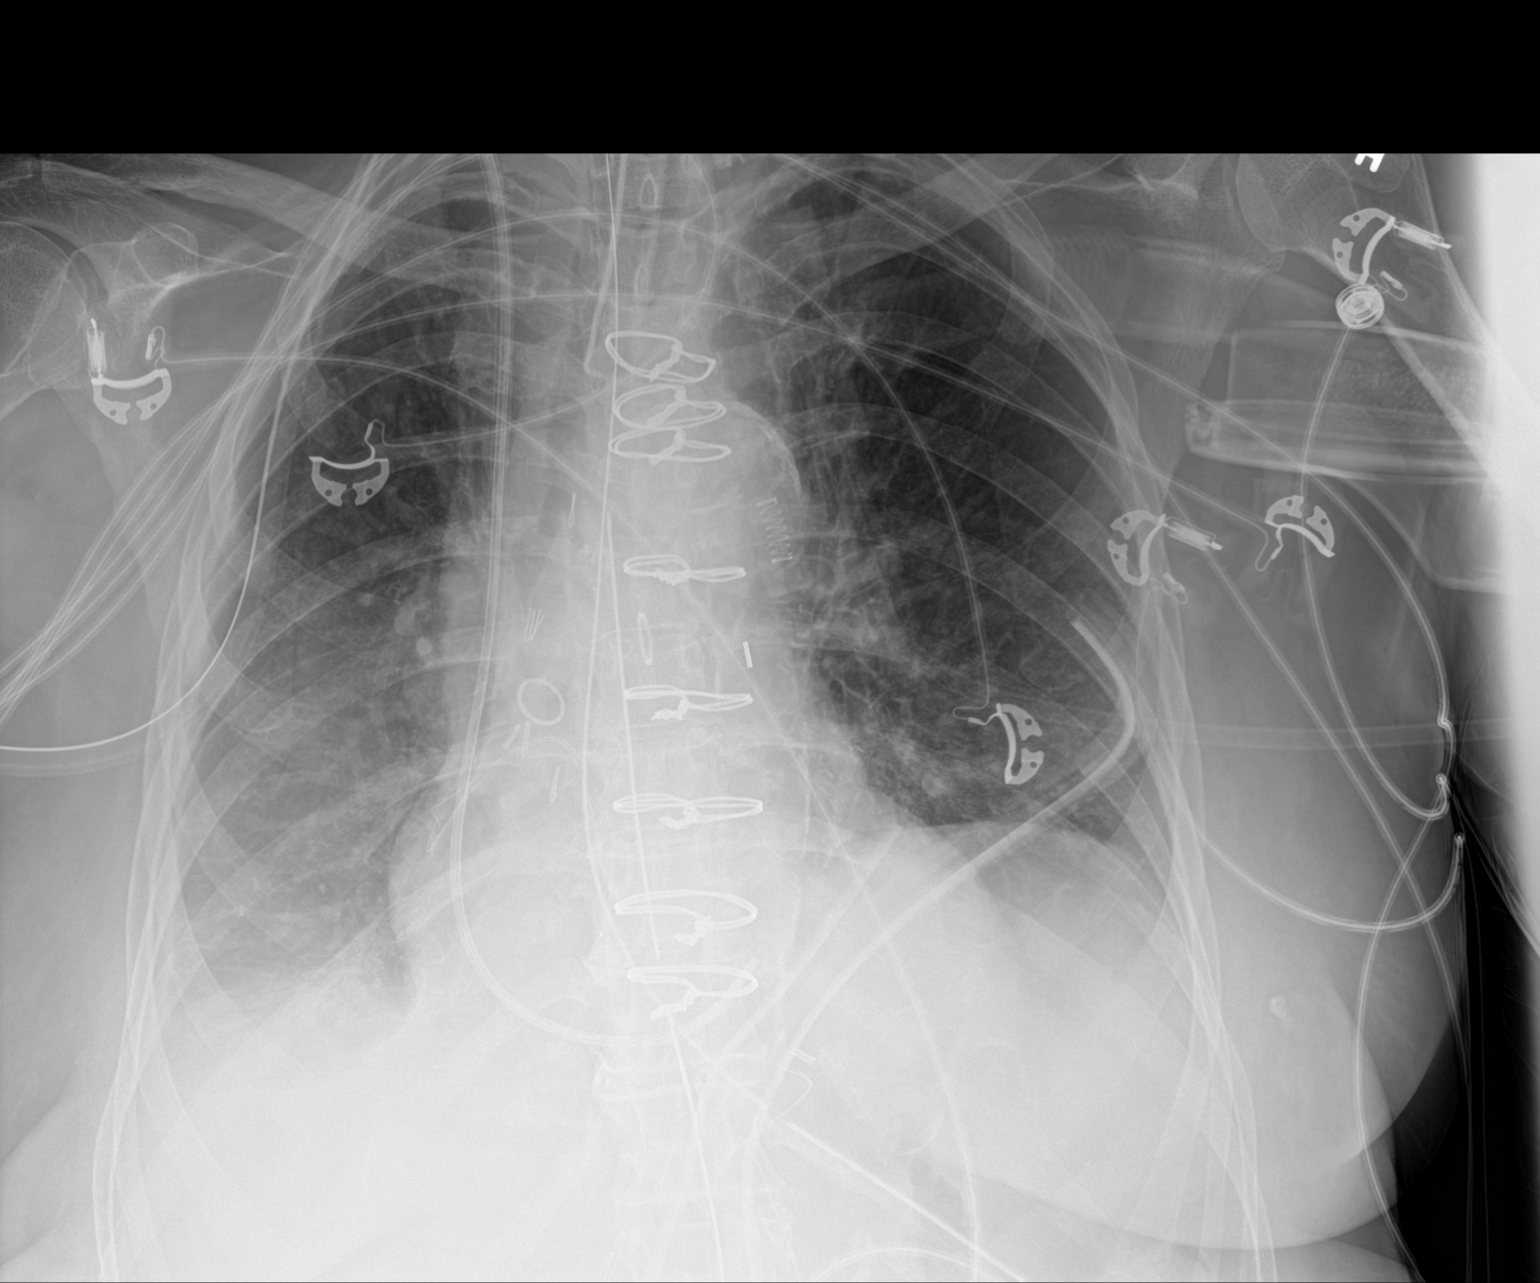

[1 of 1 positions shown; findings below may reference images not displayed]

FINDINGS: Portable AP semi upright view at 0373 hours. Endotracheal tube tip
in good position just below the clavicles. Enteric tube courses to
the abdomen, side hole to level of the gastric cardia. Stable left
chest tubes. Stable mediastinal tube. Right IJ approach Swan-Ganz
catheter tip continues to project at the level of the right
ventricle. Probable intra aortic balloon pump marker in the
descending thoracic aorta about 1 vertebral body below the carina.

Stable lung volumes. Stable cardiac size and mediastinal contours.
Veiling opacity in the right lung has mildly increased. No
pneumothorax. No pulmonary edema.
IMPRESSION: 1.  Stable lines and tubes.
Right IJ approach Swan-Ganz catheter tip at the right ventricle
level, and probable aortic balloon pump marker in the descending
thoracic aorta.
2. Small to moderate right pleural effusion suspected. No
pneumothorax or pulmonary edema.

## 2020-01-01 DIAGNOSIS — I272 Pulmonary hypertension, unspecified: Secondary | ICD-10-CM | POA: Diagnosis not present

## 2020-01-01 DIAGNOSIS — E7800 Pure hypercholesterolemia, unspecified: Secondary | ICD-10-CM | POA: Diagnosis not present

## 2020-01-01 DIAGNOSIS — I209 Angina pectoris, unspecified: Secondary | ICD-10-CM | POA: Diagnosis not present

## 2020-01-01 DIAGNOSIS — F3340 Major depressive disorder, recurrent, in remission, unspecified: Secondary | ICD-10-CM | POA: Diagnosis not present

## 2020-01-01 DIAGNOSIS — N1831 Chronic kidney disease, stage 3a: Secondary | ICD-10-CM | POA: Diagnosis not present

## 2020-01-01 DIAGNOSIS — I129 Hypertensive chronic kidney disease with stage 1 through stage 4 chronic kidney disease, or unspecified chronic kidney disease: Secondary | ICD-10-CM | POA: Diagnosis not present

## 2020-01-01 DIAGNOSIS — I2510 Atherosclerotic heart disease of native coronary artery without angina pectoris: Secondary | ICD-10-CM | POA: Diagnosis not present

## 2020-01-02 ENCOUNTER — Other Ambulatory Visit: Payer: Self-pay | Admitting: Cardiovascular Disease

## 2020-01-03 ENCOUNTER — Telehealth: Payer: Self-pay

## 2020-01-03 ENCOUNTER — Encounter (INDEPENDENT_AMBULATORY_CARE_PROVIDER_SITE_OTHER): Payer: Self-pay

## 2020-01-03 ENCOUNTER — Encounter: Payer: Self-pay | Admitting: Family

## 2020-01-03 ENCOUNTER — Other Ambulatory Visit: Payer: Self-pay | Admitting: Family

## 2020-01-03 ENCOUNTER — Ambulatory Visit (INDEPENDENT_AMBULATORY_CARE_PROVIDER_SITE_OTHER): Payer: Medicare Other | Admitting: Family

## 2020-01-03 ENCOUNTER — Other Ambulatory Visit: Payer: Self-pay

## 2020-01-03 VITALS — BP 134/70 | HR 68 | Ht 66.0 in | Wt 197.0 lb

## 2020-01-03 DIAGNOSIS — I10 Essential (primary) hypertension: Secondary | ICD-10-CM

## 2020-01-03 DIAGNOSIS — I25118 Atherosclerotic heart disease of native coronary artery with other forms of angina pectoris: Secondary | ICD-10-CM

## 2020-01-03 DIAGNOSIS — R0789 Other chest pain: Secondary | ICD-10-CM | POA: Diagnosis not present

## 2020-01-03 DIAGNOSIS — E782 Mixed hyperlipidemia: Secondary | ICD-10-CM

## 2020-01-03 DIAGNOSIS — I6522 Occlusion and stenosis of left carotid artery: Secondary | ICD-10-CM | POA: Diagnosis not present

## 2020-01-03 MED ORDER — MELOXICAM 5 MG PO CAPS: ORAL_CAPSULE | ORAL | 0 refills | Status: DC

## 2020-01-03 MED ORDER — MELOXICAM 7.5 MG PO TABS: ORAL_TABLET | ORAL | 0 refills | Status: DC

## 2020-01-03 NOTE — Telephone Encounter (Signed)
Please see pharmacy request below. Thank you!

## 2020-01-03 NOTE — Telephone Encounter (Signed)
Pt son called "Fritz Pickerel" who was with pt at her visit to update them on medication change while they are headed to pharmacy to pick up pt's Meloxicam 5mg , which was change to 7.5mg . Fritz Pickerel verbalized understanding.

## 2020-01-03 NOTE — Progress Notes (Signed)
Office Visit    Patient Name: [name] Date of Encounter: 01/03/2020  Primary Care Provider:  Lajean Manes, MD Primary Cardiologist:  Ida Rogue, MD Electrophysiologist:  None   Chief Complaint    KALLY CADDEN is a 86 y.o. female with a hx of CAD s/p DES and CABG X3 12/2016 with postoperative atrial fibrillation, PVC, hypertension, carotid arterial disease, gallstones s/p ERCP 02/2018, hyperlipidemia presents today for follow-up after ED visit for chest pain  Past Medical History    Past Medical History:  Diagnosis Date  . Anxiety   . Atypical carcinoid lung tumor ([place])   . CAD (coronary artery disease)    a. 1986: s/p PTCA of bifurcation lesion in LAD;  b. 2001: s/p BMS to LAD & LCX;  c. 04/2006 Cath: diff CAD w/o critical narrowing, nl EF;  d. 12/2014 MV: inflat ST dep, mild basal inf ischemia, EF 55-65%-->Low risk; e. 12/2016 - 3V CABG.  . Cancer [facility]) yrs ago her pt   right lobe of lung. surgery only.   . Carotid disease, bilateral ([place])    a. 08/2015 Carotid U/S: < 39% bilat ICA stenosis.  . Cataracts, bilateral   . Cholangitis   . Choledocholithiasis   . Cholelithiases   . Chronic diarrhea   . Depression   . Frequent PVCs   . Gilbert's syndrome   . Headache    PMH: migraines  . History of echocardiogram    a. 06/2010 Echo: EF 60-65%, mild AI/MR; b. 10/2015 Echo: EF 60-65%, no rwma, mild MR, nl RV fxn.  Marland Kitchen HTN (hypertension)   . Hyperlipidemia   . Impaired fasting blood sugar   . Keloid    mid sternum  . Macular degeneration   . Osteoarthritis   . Pneumonia   . PONV (postoperative nausea and vomiting)   . Positional vertigo    benign  . Postoperative atrial fibrillation ([place]) 12/2016  . S/P CABG x 3 12/23/2016   LIMA to LAD, SVG to OM, SVG to PDA, EVH via left thigh  . TIA (transient ischemic attack)   . Wears glasses   . Wears hearing aid    Past Surgical History:  Procedure Laterality Date  . ABDOMINAL AORTOGRAM N/A 12/22/2016   Procedure:  ABDOMINAL AORTOGRAM;  Surgeon: Nelva Bush, MD;  Location: [place] [facility];  Service: Cardiovascular;  Laterality: N/A;  . ABDOMINAL HYSTERECTOMY    . BILIARY STENT PLACEMENT  12/10/2017   Procedure: BILIARY STENT PLACEMENT;  Surgeon: Irving Copas., MD;  Location: [place];  Service: Gastroenterology;;  . CHOLECYSTECTOMY    . CORONARY ARTERY BYPASS GRAFT N/A 12/23/2016   Procedure: CORONARY ARTERY BYPASS GRAFTING (CABG) TIMES THREE USING LEFT INTERNAL MAMMARY ARTERY AND LEFT SAPHENOUS LEG VEIN HARVESTED ENDOSCOPICALLY,;  Surgeon: Rexene Alberts, MD;  Location: [place];  Service: Open Heart Surgery;  Laterality: N/A;  . ENDOSCOPIC RETROGRADE CHOLANGIOPANCREATOGRAPHY (ERCP) WITH PROPOFOL N/A 02/21/2018   Procedure: ENDOSCOPIC RETROGRADE CHOLANGIOPANCREATOGRAPHY (ERCP) WITH PROPOFOL;  Surgeon: Rush Landmark Telford Nab., MD;  Location: [place];  Service: Gastroenterology;  Laterality: N/A;  . ERCP N/A 12/10/2017   Procedure: ENDOSCOPIC RETROGRADE CHOLANGIOPANCREATOGRAPHY (ERCP);  Surgeon: Irving Copas., MD;  Location: [place];  Service: Gastroenterology;  Laterality: N/A;  . IABP INSERTION N/A 12/22/2016   Procedure: IABP Insertion;  Surgeon: Nelva Bush, MD;  Location: [place] [facility];  Service: Cardiovascular;  Laterality: N/A;  . LEFT HEART CATH AND CORONARY ANGIOGRAPHY N/A 12/22/2016   Procedure: LEFT HEART CATH  AND CORONARY ANGIOGRAPHY;  Surgeon: Nelva Bush, MD;  Location: [place] [facility];  Service: Cardiovascular;  Laterality: N/A;  . LEFT HEART CATHETERIZATION WITH CORONARY ANGIOGRAM N/A 04/27/2012   Procedure: LEFT HEART CATHETERIZATION WITH CORONARY ANGIOGRAM;  Surgeon: Peter M Martinique, MD;  Location[facility] C[facility];  Service: Cardiovascular;  Laterality: N/A;  . LOBECTOMY Right    RLL atypical carcinoid tumor - Dr Arlyce Dice  . pci  1986  . PCI of LAD  2001   with non des lad   . pci of mid cfx     with non des and cutting balloon pci  of av circumflex  . REMOVAL OF STONES  02/21/2018   Procedure: REMOVAL OF STONES;  Surgeon: Rush Landmark Telford Nab., MD;  Location: [place];  Service: Gastroenterology;;  . Joan Mayans  02/21/2018   Procedure: Joan Mayans;  Surgeon: Irving Copas., MD;  Location: [place];  Service: Gastroenterology;;  . Lavell Islam REMOVAL  02/21/2018   Procedure: STENT REMOVAL;  Surgeon: Irving Copas., MD;  Location: [place];  Service: Gastroenterology;;  . TEE WITHOUT CARDIOVERSION N/A 12/23/2016   Procedure: TRANSESOPHAGEAL ECHOCARDIOGRAM (TEE);  Surgeon: Rexene Alberts, MD;  Location: [place];  Service: Open Heart Surgery;  Laterality: N/A;    Allergies  Allergies  Allergen Reactions  . Sertraline Hcl Nausea Only  . Biaxin [Clarithromycin]     depressed  . Celecoxib Other (See Comments)    Reaction: Unknown  . Ciprofloxacin   . Codeine Other (See Comments)    Reaction: Unknown  . Hydromorphone   . Lisinopril     cough  . Macrobid [employer] Macro] Other (See Comments)    Reaction: Unknown  . Oxycodone Hcl Other (See Comments)    Reaction: Unknown  . Prednisone   . Sertraline Hcl Other (See Comments)    Reaction: Unknown  . Penicillins Rash    Reaction: Unknown    History of Present Illness    [name] is a 86 y.o. female with a hx of CAD s/p DES and CABG X3 12/2016 with postoperative atrial fibrillation, PVC, hypertension, carotid arterial disease, gallstones s/p ERCP 02/2018, hyperlipidemia  last seen 01/25/2019 by Dr. Rockey Situ.  She has had previous PCI and underwent CABG X3 12/2016 after cardiac cath showed severe three-vessel disease including 90% distal LMCA stenosis as well as CTO of RCA.  Her CABG included LIMA to LAD, vein graft to PDA, vein graft to OM.  Most recent echocardiogram 11/2017 LVEF 55 to 60%, no wall motion abnormalities, grade 1 diastolic dysfunction, moderate MR, mild TR, moderate pulmonary hypertension.  ED visit  12/04/2019 for atypical chest pain described as sharp, burning stinging.  It was noted to be at the site of her previous bypass site.  Pain improved with tramadol. HS-troponin 6 ? 7.  CT chest report noted " area of clinical concern lies adjacent to the inferior midsternal wire although no focal area of inflammatory change with soft tissue mass is seen".   Presents today for follow-up with her son.  Reports continued chest discomfort that is below her previous midsternal incision.  It is tender on palpation.  She does me the pain starts there and then "jumps around her chest ".  It occurs at rest and with exertion.  No noted aggravating or relieving factors.  She associates it with nausea.  Has not thrown up.  She is very anxious.  Tells me her primary care provider provided her with a prescription for meloxicam but she  saw the side effects included death and did not start it.  Reassurance provided.  EKGs/Labs/Other Studies Reviewed:   The following studies were reviewed today:  EKG:  EKG is ordered today.  The ekg ordered today demonstrates NSR 60 bpm with stable ST/T wave abnormality in lateral leads.  No acute ST/T wave changes.  Recent Labs: 12/04/2019: ALT 24; BUN 15; Creatinine, Ser 0.85; Hemoglobin 13.1; Platelets 177; Potassium 3.8; Sodium 140  Recent Lipid Panel    Component Value Date/Time   CHOL 121 02/04/2018 0537   CHOL 136 11/04/2013 0541   TRIG 167 (H) 02/04/2018 0537   TRIG 329 (H) 11/04/2013 0541   HDL 47 02/04/2018 0537   HDL 56 11/04/2013 0541   CHOLHDL 2.6 02/04/2018 0537   VLDL 33 02/04/2018 0537   VLDL 66 (H) 11/04/2013 0541   LDLCALC 41 02/04/2018 0537   LDLCALC 14 11/04/2013 0541    Home Medications   Current Meds  Medication Sig  . acetaminophen (TYLENOL) 325 MG tablet Take 2 tablets (650 mg total) by mouth every 6 (six) hours as needed for mild pain or headache.  Marland Kitchen amLODipine (NORVASC) 5 MG tablet Take 5 mg by mouth. 1 tablet in the morning and half tablet  at night.  Marland Kitchen aspirin 81 MG tablet Take 81 mg by mouth daily.  Marland Kitchen atorvastatin (LIPITOR) 40 MG tablet Take 40 mg by mouth daily.  . calcium carbonate (TUMS EX) 750 MG chewable tablet Chew 2 tablets by mouth as needed for heartburn.  . Calcium Carbonate-Vitamin D (CALCIUM 600 + D PO) Take 1 tablet by mouth daily. 600 mg/ 400 mg  . estradiol (ESTRACE) 0.5 MG tablet Take 0.5 mg by mouth daily.  . furosemide (LASIX) 40 MG tablet TAKE 1 TABLET BY MOUTH EVERY DAY  . KLOR-CON M20 20 MEQ tablet TAKE 1 TABLET BY MOUTH TWICE A DAY  . LORazepam (ATIVAN) 0.5 MG tablet Take 1 tablet (0.5 mg total) by mouth every 8 (eight) hours as needed for anxiety.  Marland Kitchen losartan (COZAAR) 100 MG tablet TAKE 0.5 TABLETS (50 MG TOTAL) BY MOUTH 2 (TWO) TIMES DAILY.  . metoprolol succinate (TOPROL-XL) 50 MG 24 hr tablet TAKE 1 TABLET (50 MG TOTAL) BY MOUTH DAILY. TAKE WITH OR IMMEDIATELY FOLLOWING A MEAL.  . Multiple Vitamin (MULTIVITAMIN) tablet Take 1 tablet by mouth daily.  . nitroGLYCERIN (NITROSTAT) 0.4 MG SL tablet PLACE 1 TABLET (0.4 MG TOTAL) UNDER THE TONGUE EVERY 5 (FIVE) MINUTES AS NEEDED FOR CHEST PAIN.  Marland Kitchen pantoprazole (PROTONIX) 40 MG tablet Take 40 mg by mouth daily.     Review of Systems  All other systems reviewed and are otherwise negative except as noted above.  Physical Exam    VS:  BP 134/70 (BP Location: Left Arm, Patient Position: Sitting, Cuff Size: Normal)   Pulse 68   Ht 5\' 6"  (1.676 m)   Wt 197 lb (89.4 kg)   SpO2 98%   BMI 31.80 kg/m  , BMI Body mass index is 31.8 kg/m.  Wt Readings from Last 3 Encounters:  01/03/20 197 lb (89.4 kg)  12/04/19 197 lb (89.4 kg)  01/25/19 189 lb 4 oz (85.8 kg)    GEN: Well nourished, well developed, in no acute distress. HEENT: normal. Neck: Supple, no JVD, carotid bruits, or masses. Cardiac: RRR, no murmurs, rubs, or gallops. No clubbing, cyanosis, edema.  Radials/DP/PT 2+ and equal bilaterally.  Respiratory:  Respirations regular and unlabored, clear to  auscultation bilaterally. GI: Soft, nontender, nondistended. MS: No  deformity or atrophy.  Left breast and distal portion of midsternal incision very tender on palpation. Skin: Warm and dry, no rash. Neuro:  Strength and sensation are intact.  Anxious Psych: Normal affect.  Assessment & Plan    1. Atypical chest pain -1 month history of atypical chest pain that occurs at the distal portion of her previous midsternal incision.  There is noted scar tissue on examination and is very tender on palpation.  Pain occurs at rest and with activity.  No noted aggravating factors.  She associates it with some nausea.  Recent ED work-up with CT chest unremarkable, high-sensitivity troponin negative x2.  EKG today is stable compared to previous with no acute ST/T wave changes.  Low suspicion cardiac in origin as it is tender on palpation and likely musculoskeletal and nerve related.  Agree with PCP recommendation for short course of meloxicam. Rx Meloxicam tab 7.5mg  one per day for 5 days then PRN.  Will provide 10 tablets.  Encouraged to utilize heat pack.  Reassurance provided.  2. Anxiety -she is very anxious in clinic today.  We discussed that she may benefit from a longer acting agent for her anxiety.  She is presently taking lorazepam at bedtime.  Encouraged to discuss a longer acting agent such as Klonopin with her primary care provider.  3. CAD s/p CABG 12/2016-stable no anginal symptoms.  GDMT includes aspirin, beta-blocker, statin.  4. HTN- BP well controlled. Continue current antihypertensive regimen.   5. PVC-denies palpitations.  Continue Toprol 50 mg daily.  6. Left carotid stenosis-08/2015 carotid duplex with less than 39% stenosis.  Denies amaurosis fugax.  Continue aspirin, statin.  7. HLD, LDL goal less than 70-continue atorvastatin 40 mg daily.  Disposition: Follow up in 1 month(s) with Dr. Rockey Situ or APP  Signed, Loel Dubonnet, NP 01/03/2020, 5:00 PM [place]

## 2020-01-03 NOTE — Patient Instructions (Signed)
Medication Instructions:  Your physician has recommended you make the following change in your medication:   START Meloxicam 5mg  one tablet daily for 5 days with food.  Then take as needed.  *If you need a refill on your cardiac medications before your next appointment, please call your pharmacy*  Lab Work: No lab work today.   Testing/Procedures: Your EKG today showed normal sinus rhythm which is a good result! No signs of new blockages.   Follow-Up: At [facility], you and your health needs are our priority.  As part of our continuing mission to provide you with exceptional heart care, we have created designated Provider Care Teams.  These Care Teams include your primary Cardiologist (physician) and Advanced Practice Providers (APPs -  Physician Assistants and Nurse Practitioners) who all work together to provide you with the care you need, when you need it.  We recommend signing up for the patient portal called "MyChart".  Sign up information is provided on this After Visit Summary.  MyChart is used to connect with patients for Virtual Visits (Telemedicine).  Patients are able to view lab/test results, encounter notes, upcoming appointments, etc.  Non-urgent messages can be sent to your provider as well.   To learn more about what you can do with MyChart, go to [email or web address].    Your next appointment:   1-2 month(s)  The format for your next appointment:   In Person  Provider:   Ida Rogue, MD   Other Instructions  Recommend using a heat pack on your chest. Your chest pain is likely musculoskeletal and nerve pain. No evidence of any heart-related pain.  In regards to your anxiety, recommend talking with your primary care provider about a longer acting agent such as Klonopin. Please use your Ativan as directed.

## 2020-02-02 ENCOUNTER — Other Ambulatory Visit: Payer: Self-pay | Admitting: Cardiovascular Disease

## 2020-02-12 DIAGNOSIS — E7800 Pure hypercholesterolemia, unspecified: Secondary | ICD-10-CM | POA: Diagnosis not present

## 2020-02-12 DIAGNOSIS — I209 Angina pectoris, unspecified: Secondary | ICD-10-CM | POA: Diagnosis not present

## 2020-02-12 DIAGNOSIS — I272 Pulmonary hypertension, unspecified: Secondary | ICD-10-CM | POA: Diagnosis not present

## 2020-02-12 DIAGNOSIS — I2510 Atherosclerotic heart disease of native coronary artery without angina pectoris: Secondary | ICD-10-CM | POA: Diagnosis not present

## 2020-02-12 DIAGNOSIS — I129 Hypertensive chronic kidney disease with stage 1 through stage 4 chronic kidney disease, or unspecified chronic kidney disease: Secondary | ICD-10-CM | POA: Diagnosis not present

## 2020-02-12 DIAGNOSIS — F3340 Major depressive disorder, recurrent, in remission, unspecified: Secondary | ICD-10-CM | POA: Diagnosis not present

## 2020-02-12 DIAGNOSIS — N1831 Chronic kidney disease, stage 3a: Secondary | ICD-10-CM | POA: Diagnosis not present

## 2020-02-19 DIAGNOSIS — H353122 Nonexudative age-related macular degeneration, left eye, intermediate dry stage: Secondary | ICD-10-CM | POA: Diagnosis not present

## 2020-02-20 ENCOUNTER — Ambulatory Visit: Payer: Medicare Other | Admitting: Cardiovascular Disease

## 2020-03-11 DIAGNOSIS — F3340 Major depressive disorder, recurrent, in remission, unspecified: Secondary | ICD-10-CM | POA: Diagnosis not present

## 2020-03-11 DIAGNOSIS — I209 Angina pectoris, unspecified: Secondary | ICD-10-CM | POA: Diagnosis not present

## 2020-03-11 DIAGNOSIS — I272 Pulmonary hypertension, unspecified: Secondary | ICD-10-CM | POA: Diagnosis not present

## 2020-03-11 DIAGNOSIS — I2510 Atherosclerotic heart disease of native coronary artery without angina pectoris: Secondary | ICD-10-CM | POA: Diagnosis not present

## 2020-03-11 DIAGNOSIS — N1831 Chronic kidney disease, stage 3a: Secondary | ICD-10-CM | POA: Diagnosis not present

## 2020-03-11 DIAGNOSIS — E7800 Pure hypercholesterolemia, unspecified: Secondary | ICD-10-CM | POA: Diagnosis not present

## 2020-03-11 DIAGNOSIS — I129 Hypertensive chronic kidney disease with stage 1 through stage 4 chronic kidney disease, or unspecified chronic kidney disease: Secondary | ICD-10-CM | POA: Diagnosis not present

## 2020-03-12 ENCOUNTER — Other Ambulatory Visit: Payer: Self-pay | Admitting: Cardiovascular Disease

## 2020-03-12 NOTE — Telephone Encounter (Signed)
Losartan on backorder. (See below) Please provide substitution. Thank you!

## 2020-04-07 NOTE — Progress Notes (Signed)
I Cardiology Office Note  Date:  04/08/2020   ID:  [name], DOB 1930/08/16, MRN [id number]  PCP:  Lajean Manes, MD   Chief Complaint  Patient presents with  . Other    1-2 month follow up - Patient c.o chest pain, SOB and swelling in legs at night. Meds reviewed verbally with patient.     HPI:  [name] is a 87 y.o. woman with  CAD,  prior PCI of the LAD in 2001 with non DES, PCI of mid LCX with non des  chronic chest pain episodes,   cardiac catheterization April 2014 showing occluded RCA, patent LAD and left circumflex and  50% carotid disease on the left.  Hospital December 2018 with chest pain, non-ST elevation MI Cardiac catheterization with critical left main disease, three-vessel disease Underwent CABG x3 Postop day 5 developed atrial fibrillation, given amiodarone, normal sinus rhythm restored LIMA to the LAD, vein graft to PDA, vein graft to OM Long history of GI issues, diarrhea, She presents today for follow-up of her coronary artery disease, frequent PVCs, hypertension, carotid arterial disease   In follow-up today reports doing relatively well  Sedentary, no exercise, walks to the [employer] presents with her, he helps to do most of her ADLs  Rare episodes of tachycardia, did not last very long  Denies significant leg swelling Weight up 11 pounds in one year  seen in the emergency room for chest pain November 2021 Cardiac enzymes negative Seen December 2021 in clinic Was felt her chest pain was atypical, at the site of her prior midsternal incision Pain was reproducible on palpation, was given meloxicam On that visit had continued chest discomfort  Continues to have some atypical type chest discomfort Has not been taking any NSAIDs  Leg weakness, no falls  Lab work reviewed HAB1C 5.6 Total cho  121, LDL 41 Nonsmoker  EKG personally reviewed by myself on todays visit Shows normal sinus rhythm rate 76 bpm nonspecific ST abnormality  Short run  atrial tachycardia 4 beats   Other past medical history reviewed January 2020 admission to the hospital with chest pain, epigastric pain Hospital records reviewed with the patient in detail Evaluated by cardiology during her admission, symptoms felt to be GI in etiology, possibly related to gallbladder  In February 2020 she was noted to have gallbladder disease, stones for which she underwent ERCP.   echocardiogram November 2019 reviewed with her. It showed ejection fraction of 55 - 60%   emergency room 07/03/2017 for hypertension Blood pressure 092 systolic at home  anxious  Long history of GI issues, diarrhea, has to take extra potassium daily Most recent potassium above 3.5 at the end of December 2016  Chronic leg weakness, does her ADLs, does not work in the garden anymore, unable to get up if she is on the ground   PMH:   has a past medical history of Anxiety, Atypical carcinoid lung tumor ([place]), CAD (coronary artery disease), Cancer ([place]) (yrs ago her pt), Carotid disease, bilateral ([place]), Cataracts, bilateral, Cholangitis, Choledocholithiasis, Cholelithiases, Chronic diarrhea, Depression, Frequent PVCs, Gilbert's syndrome, Headache, History of echocardiogram, HTN (hypertension), Hyperlipidemia, Impaired fasting blood sugar, Keloid, Macular degeneration, Osteoarthritis, Pneumonia, PONV (postoperative nausea and vomiting), Positional vertigo, Postoperative atrial fibrillation ([place]) (12/2016), S/P CABG x 3 (12/23/2016), TIA (transient ischemic attack), Wears glasses, and Wears hearing aid.  PSH:    Past Surgical History:  Procedure Laterality Date  . ABDOMINAL AORTOGRAM N/A 12/22/2016   Procedure: ABDOMINAL AORTOGRAM;  Surgeon:  End, Harrell Gave, MD;  Location: [place] [facility];  Service: Cardiovascular;  Laterality: N/A;  . ABDOMINAL HYSTERECTOMY    . BILIARY STENT PLACEMENT  12/10/2017   Procedure: BILIARY STENT PLACEMENT;  Surgeon: Irving Copas., MD;  Location: [place];  Service: Gastroenterology;;  . CHOLECYSTECTOMY    . CORONARY ARTERY BYPASS GRAFT N/A 12/23/2016   Procedure: CORONARY ARTERY BYPASS GRAFTING (CABG) TIMES THREE USING LEFT INTERNAL MAMMARY ARTERY AND LEFT SAPHENOUS LEG VEIN HARVESTED ENDOSCOPICALLY,;  Surgeon: Rexene Alberts, MD;  Location: [place];  Service: Open Heart Surgery;  Laterality: N/A;  . ENDOSCOPIC RETROGRADE CHOLANGIOPANCREATOGRAPHY (ERCP) WITH PROPOFOL N/A 02/21/2018   Procedure: ENDOSCOPIC RETROGRADE CHOLANGIOPANCREATOGRAPHY (ERCP) WITH PROPOFOL;  Surgeon: Rush Landmark Telford Nab., MD;  Location: [place];  Service: Gastroenterology;  Laterality: N/A;  . ERCP N/A 12/10/2017   Procedure: ENDOSCOPIC RETROGRADE CHOLANGIOPANCREATOGRAPHY (ERCP);  Surgeon: Irving Copas., MD;  Location: [place];  Service: Gastroenterology;  Laterality: N/A;  . IABP INSERTION N/A 12/22/2016   Procedure: IABP Insertion;  Surgeon: Nelva Bush, MD;  Location: [place] [facility];  Service: Cardiovascular;  Laterality: N/A;  . LEFT HEART CATH AND CORONARY ANGIOGRAPHY N/A 12/22/2016   Procedure: LEFT HEART CATH AND CORONARY ANGIOGRAPHY;  Surgeon: Nelva Bush, MD;  Location: [place] [facility];  Service: Cardiovascular;  Laterality: N/A;  . LEFT HEART CATHETERIZATION WITH CORONARY ANGIOGRAM N/A 04/27/2012   Procedure: LEFT HEART CATHETERIZATION WITH CORONARY ANGIOGRAM;  Surgeon: Peter M Martinique, MD;  Location: [facility];  Service: Cardiovascular;  Laterality: N/A;  . LOBECTOMY Right    RLL atypical carcinoid tumor - Dr Arlyce Dice  . pci  1986  . PCI of LAD  2001   with non des lad   . pci of mid cfx     with non des and cutting balloon pci of av circumflex  . REMOVAL OF STONES  02/21/2018   Procedure: REMOVAL OF STONES;  Surgeon: Rush Landmark Telford Nab., MD;  Location: [place];  Service: Gastroenterology;;  . Joan Mayans  02/21/2018   Procedure: Joan Mayans;  Surgeon: Irving Copas., MD;  Location: [place];  Service: Gastroenterology;;  . Lavell Islam REMOVAL  02/21/2018   Procedure: STENT REMOVAL;  Surgeon: Irving Copas., MD;  Location: [place];  Service: Gastroenterology;;  . TEE WITHOUT CARDIOVERSION N/A 12/23/2016   Procedure: TRANSESOPHAGEAL ECHOCARDIOGRAM (TEE);  Surgeon: Rexene Alberts, MD;  Location: [place];  Service: Open Heart Surgery;  Laterality: N/A;    Current Outpatient Medications  Medication Sig Dispense Refill  . acetaminophen (TYLENOL) 325 MG tablet Take 2 tablets (650 mg total) by mouth every 6 (six) hours as needed for mild pain or headache.    Marland Kitchen amLODipine (NORVASC) 5 MG tablet Take 5 mg by mouth. 1 tablet in the morning and half tablet at night.    Marland Kitchen aspirin 81 MG tablet Take 81 mg by mouth daily.    Marland Kitchen atorvastatin (LIPITOR) 40 MG tablet Take 40 mg by mouth daily.    . calcium carbonate (TUMS EX) 750 MG chewable tablet Chew 2 tablets by mouth as needed for heartburn.    . Calcium Carbonate-Vitamin D (CALCIUM 600 + D PO) Take 1 tablet by mouth daily. 600 mg/ 400 mg    . estradiol (ESTRACE) 0.5 MG tablet Take 0.5 mg by mouth daily.    . furosemide (LASIX) 40 MG tablet TAKE 1 TABLET BY MOUTH EVERY DAY 90 tablet 3  . LORazepam (ATIVAN) 0.5 MG tablet Take 1 tablet (0.5 mg  total) by mouth every 8 (eight) hours as needed for anxiety. 30 tablet 0  . losartan (COZAAR) 100 MG tablet Take 1/2 tablet (50MG ) by mouth twice a day.    . meloxicam (MOBIC) 7.5 MG tablet Take one tablet daily for five days. Then take as needed for pain. Further refills with primary care provider. 10 tablet 0  . metoprolol succinate (TOPROL-XL) 50 MG 24 hr tablet TAKE 1 TABLET (50 MG TOTAL) BY MOUTH DAILY. TAKE WITH OR IMMEDIATELY FOLLOWING A MEAL. 90 tablet 3  . Multiple Vitamin (MULTIVITAMIN) tablet Take 1 tablet by mouth daily.    . nitroGLYCERIN (NITROSTAT) 0.4 MG SL tablet PLACE 1 TABLET (0.4 MG TOTAL) UNDER THE TONGUE EVERY 5 (FIVE) MINUTES AS NEEDED FOR CHEST PAIN. 25 tablet 1  .  pantoprazole (PROTONIX) 40 MG tablet Take 40 mg by mouth daily.    . Potassium Chloride ER 20 MEQ TBCR Take 20 mg by mouth in the morning and at bedtime.    Marland Kitchen telmisartan (MICARDIS) 80 MG tablet Take 1 tablet (80 mg total) by mouth daily. 90 tablet 3   No current facility-administered medications for this visit.     Allergies:   Sertraline hcl, Biaxin [clarithromycin], Celecoxib, Ciprofloxacin, Codeine, Hydromorphone, Lisinopril, Macrobid [nitrofurantoin monohyd macro], Oxycodone hcl, Prednisone, Sertraline hcl, and Penicillins   Social History:  The patient  reports that she has never smoked. She has never used smokeless tobacco. She reports that she does not drink alcohol and does not use drugs.   Family History:   family history includes CAD in her father and mother; Diabetes Mellitus I in her brother, mother, sister, and sister.    Review of Systems: Review of Systems  Constitutional: Negative.   Eyes: Negative.   Respiratory: Negative.  Negative for shortness of breath.   Cardiovascular: Negative.  Negative for chest pain.  Gastrointestinal: Negative.   Genitourinary: Negative.   Musculoskeletal: Negative.        Leg weakness  Neurological: Negative.   Psychiatric/Behavioral: Negative.   All other systems reviewed and are negative.   PHYSICAL EXAM: VS:  BP 130/60 (BP Location: Left Arm, Patient Position: Sitting, Cuff Size: Normal)   Pulse 76   Ht 5' 6.5" (1.689 m)   Wt 200 lb (90.7 kg)   SpO2 98%   BMI 31.80 kg/m  , BMI Body mass index is 31.8 kg/m.  Constitutional:  oriented to person, place, and time. No distress.  HENT:  Head: Grossly normal Eyes:  no discharge. No scleral icterus.  Neck: No JVD, no carotid bruits  Cardiovascular: Regular rate and rhythm, no murmurs appreciated Pulmonary/Chest: Clear to auscultation bilaterally, no wheezes or rails Abdominal: Soft.  no distension.  no tenderness.  Musculoskeletal: Normal range of motion Neurological:  normal  muscle tone. Coordination normal. No atrophy Skin: Skin warm and dry Psychiatric: normal affect, pleasant  Recent Labs: 12/04/2019: ALT 24; BUN 15; Creatinine, Ser 0.85; Hemoglobin 13.1; Platelets 177; Potassium 3.8; Sodium 140    Lipid Panel Lab Results  Component Value Date   CHOL 121 02/04/2018   HDL 47 02/04/2018   LDLCALC 41 02/04/2018   TRIG 167 (H) 02/04/2018    Wt Readings from Last 3 Encounters:  04/08/20 200 lb (90.7 kg)  01/03/20 197 lb (89.4 kg)  12/04/19 197 lb (89.4 kg)     ASSESSMENT AND PLAN:  CABG Three-vessel bypass surgery November 2018 Having some atypical chest discomfort No further cardiac work-up needed  Atypical chest pain Consider ice pack, NSAIDs  Coronary artery disease involving native coronary artery of native heart with angina pectoris with documented spasm ([place])  Currently with no symptoms of angina. No further workup at this time. Continue current medication regimen.  Essential hypertension  Blood pressure is well controlled on today's visit. No changes made to the medications.  PVC's (premature ventricular contractions) Denies palpitations On metoprolol  Left carotid stenosis Less than 39% blockage in the carotids Cholesterol at goal   Total encounter time more than 25 minutes  Greater than 50% was spent in counseling and coordination of care with the patient    Orders Placed This Encounter  Procedures  . EKG 12-Lead     Signed, Esmond Plants, M.D., Ph.D. 04/08/2020  [place], [place]

## 2020-04-08 ENCOUNTER — Other Ambulatory Visit: Payer: Self-pay

## 2020-04-08 ENCOUNTER — Ambulatory Visit (INDEPENDENT_AMBULATORY_CARE_PROVIDER_SITE_OTHER): Payer: Medicare Other | Admitting: Cardiovascular Disease

## 2020-04-08 VITALS — BP 130/60 | HR 76 | Ht 66.5 in | Wt 200.0 lb

## 2020-04-08 DIAGNOSIS — I6522 Occlusion and stenosis of left carotid artery: Secondary | ICD-10-CM

## 2020-04-08 DIAGNOSIS — Z951 Presence of aortocoronary bypass graft: Secondary | ICD-10-CM

## 2020-04-08 DIAGNOSIS — I25118 Atherosclerotic heart disease of native coronary artery with other forms of angina pectoris: Secondary | ICD-10-CM

## 2020-04-08 DIAGNOSIS — E782 Mixed hyperlipidemia: Secondary | ICD-10-CM

## 2020-04-08 DIAGNOSIS — I10 Essential (primary) hypertension: Secondary | ICD-10-CM | POA: Diagnosis not present

## 2020-04-08 NOTE — Patient Instructions (Addendum)
Aleve/Ibuprofen or advil sparingly for chest soreness Ice pack and/or heat Likely from sternal wire   Medication Instructions:  No changes  For tachycardia epsiodes, Ok to take extra 1/2 metoprolol pill  If you need a refill on your cardiac medications before your next appointment, please call your pharmacy.    Lab work: No new labs needed   If you have labs (blood work) drawn today and your tests are completely normal, you will receive your results only by: Marland Kitchen MyChart Message (if you have MyChart) OR . A paper copy in the mail If you have any lab test that is abnormal or we need to change your treatment, we will call you to review the results.   Testing/Procedures: No new testing needed   Follow-Up: At [facility], you and your health needs are our priority.  As part of our continuing mission to provide you with exceptional heart care, we have created designated Provider Care Teams.  These Care Teams include your primary Cardiologist (physician) and Advanced Practice Providers (APPs -  Physician Assistants and Nurse Practitioners) who all work together to provide you with the care you need, when you need it.  . You will need a follow up appointment in 6 months  . Providers on your designated Care Team:   . Murray Hodgkins, NP . Christell Faith, PA-C . Marrianne Mood, PA-C  Any Other Special Instructions Will Be Listed Below (If Applicable).  COVID-19 Vaccine Information can be found at: [email or web address].uk For questions related to vaccine distribution or appointments, please email vaccine@ [email or web address] or call [phone].

## 2020-04-10 DIAGNOSIS — I129 Hypertensive chronic kidney disease with stage 1 through stage 4 chronic kidney disease, or unspecified chronic kidney disease: Secondary | ICD-10-CM | POA: Diagnosis not present

## 2020-04-10 DIAGNOSIS — I209 Angina pectoris, unspecified: Secondary | ICD-10-CM | POA: Diagnosis not present

## 2020-04-10 DIAGNOSIS — I2510 Atherosclerotic heart disease of native coronary artery without angina pectoris: Secondary | ICD-10-CM | POA: Diagnosis not present

## 2020-04-10 DIAGNOSIS — F3340 Major depressive disorder, recurrent, in remission, unspecified: Secondary | ICD-10-CM | POA: Diagnosis not present

## 2020-04-10 DIAGNOSIS — I272 Pulmonary hypertension, unspecified: Secondary | ICD-10-CM | POA: Diagnosis not present

## 2020-04-10 DIAGNOSIS — N1831 Chronic kidney disease, stage 3a: Secondary | ICD-10-CM | POA: Diagnosis not present

## 2020-04-10 DIAGNOSIS — E7800 Pure hypercholesterolemia, unspecified: Secondary | ICD-10-CM | POA: Diagnosis not present

## 2020-04-19 DIAGNOSIS — I351 Nonrheumatic aortic (valve) insufficiency: Secondary | ICD-10-CM | POA: Diagnosis not present

## 2020-04-19 DIAGNOSIS — I209 Angina pectoris, unspecified: Secondary | ICD-10-CM | POA: Diagnosis not present

## 2020-04-19 DIAGNOSIS — Z79899 Other long term (current) drug therapy: Secondary | ICD-10-CM | POA: Diagnosis not present

## 2020-04-19 DIAGNOSIS — I340 Nonrheumatic mitral (valve) insufficiency: Secondary | ICD-10-CM | POA: Diagnosis not present

## 2020-04-19 DIAGNOSIS — I129 Hypertensive chronic kidney disease with stage 1 through stage 4 chronic kidney disease, or unspecified chronic kidney disease: Secondary | ICD-10-CM | POA: Diagnosis not present

## 2020-04-19 DIAGNOSIS — E7800 Pure hypercholesterolemia, unspecified: Secondary | ICD-10-CM | POA: Diagnosis not present

## 2020-04-19 DIAGNOSIS — R29898 Other symptoms and signs involving the musculoskeletal system: Secondary | ICD-10-CM | POA: Diagnosis not present

## 2020-04-19 DIAGNOSIS — N1831 Chronic kidney disease, stage 3a: Secondary | ICD-10-CM | POA: Diagnosis not present

## 2020-04-19 DIAGNOSIS — I700 Atherosclerosis of aorta: Secondary | ICD-10-CM | POA: Diagnosis not present

## 2020-04-19 DIAGNOSIS — I272 Pulmonary hypertension, unspecified: Secondary | ICD-10-CM | POA: Diagnosis not present

## 2020-04-26 DIAGNOSIS — I272 Pulmonary hypertension, unspecified: Secondary | ICD-10-CM | POA: Diagnosis not present

## 2020-04-26 DIAGNOSIS — I2510 Atherosclerotic heart disease of native coronary artery without angina pectoris: Secondary | ICD-10-CM | POA: Diagnosis not present

## 2020-04-26 DIAGNOSIS — I129 Hypertensive chronic kidney disease with stage 1 through stage 4 chronic kidney disease, or unspecified chronic kidney disease: Secondary | ICD-10-CM | POA: Diagnosis not present

## 2020-04-26 DIAGNOSIS — I209 Angina pectoris, unspecified: Secondary | ICD-10-CM | POA: Diagnosis not present

## 2020-04-26 DIAGNOSIS — N1831 Chronic kidney disease, stage 3a: Secondary | ICD-10-CM | POA: Diagnosis not present

## 2020-04-26 DIAGNOSIS — F3340 Major depressive disorder, recurrent, in remission, unspecified: Secondary | ICD-10-CM | POA: Diagnosis not present

## 2020-04-26 DIAGNOSIS — E7800 Pure hypercholesterolemia, unspecified: Secondary | ICD-10-CM | POA: Diagnosis not present

## 2020-05-04 ENCOUNTER — Other Ambulatory Visit: Payer: Self-pay | Admitting: Cardiovascular Disease

## 2020-05-29 DIAGNOSIS — I209 Angina pectoris, unspecified: Secondary | ICD-10-CM | POA: Diagnosis not present

## 2020-05-29 DIAGNOSIS — K219 Gastro-esophageal reflux disease without esophagitis: Secondary | ICD-10-CM | POA: Diagnosis not present

## 2020-05-29 DIAGNOSIS — I129 Hypertensive chronic kidney disease with stage 1 through stage 4 chronic kidney disease, or unspecified chronic kidney disease: Secondary | ICD-10-CM | POA: Diagnosis not present

## 2020-05-29 DIAGNOSIS — I2510 Atherosclerotic heart disease of native coronary artery without angina pectoris: Secondary | ICD-10-CM | POA: Diagnosis not present

## 2020-05-29 DIAGNOSIS — F3340 Major depressive disorder, recurrent, in remission, unspecified: Secondary | ICD-10-CM | POA: Diagnosis not present

## 2020-05-29 DIAGNOSIS — N1831 Chronic kidney disease, stage 3a: Secondary | ICD-10-CM | POA: Diagnosis not present

## 2020-05-29 DIAGNOSIS — E7800 Pure hypercholesterolemia, unspecified: Secondary | ICD-10-CM | POA: Diagnosis not present

## 2020-05-29 DIAGNOSIS — I272 Pulmonary hypertension, unspecified: Secondary | ICD-10-CM | POA: Diagnosis not present

## 2020-07-08 DIAGNOSIS — H353122 Nonexudative age-related macular degeneration, left eye, intermediate dry stage: Secondary | ICD-10-CM | POA: Diagnosis not present

## 2020-07-17 DIAGNOSIS — I209 Angina pectoris, unspecified: Secondary | ICD-10-CM | POA: Diagnosis not present

## 2020-07-17 DIAGNOSIS — K219 Gastro-esophageal reflux disease without esophagitis: Secondary | ICD-10-CM | POA: Diagnosis not present

## 2020-07-17 DIAGNOSIS — I272 Pulmonary hypertension, unspecified: Secondary | ICD-10-CM | POA: Diagnosis not present

## 2020-07-17 DIAGNOSIS — I129 Hypertensive chronic kidney disease with stage 1 through stage 4 chronic kidney disease, or unspecified chronic kidney disease: Secondary | ICD-10-CM | POA: Diagnosis not present

## 2020-07-17 DIAGNOSIS — E7800 Pure hypercholesterolemia, unspecified: Secondary | ICD-10-CM | POA: Diagnosis not present

## 2020-07-17 DIAGNOSIS — N1831 Chronic kidney disease, stage 3a: Secondary | ICD-10-CM | POA: Diagnosis not present

## 2020-07-17 DIAGNOSIS — F3340 Major depressive disorder, recurrent, in remission, unspecified: Secondary | ICD-10-CM | POA: Diagnosis not present

## 2020-07-17 DIAGNOSIS — I2510 Atherosclerotic heart disease of native coronary artery without angina pectoris: Secondary | ICD-10-CM | POA: Diagnosis not present

## 2020-09-12 DIAGNOSIS — H40003 Preglaucoma, unspecified, bilateral: Secondary | ICD-10-CM | POA: Diagnosis not present

## 2020-10-06 NOTE — Progress Notes (Signed)
I Cardiology Office Note  Date:  10/08/2020   ID:  [name], DOB 1930/03/10, MRN [id number]  PCP:  Lajean Manes, MD   Chief Complaint  Patient presents with   6 month follow up     Patient c/o shortness of breath with little to no activity and LE edema. Medications reviewed by the patient verbally.     HPI:  [name] is a 87 y.o. woman with  CAD,  prior PCI of the LAD in 2001 with non DES, PCI of mid LCX with non des  chronic chest pain episodes,   cardiac catheterization April 2014 showing occluded RCA, patent LAD and left circumflex and  50% carotid disease on the left.  Hospital December 2018 with chest pain, non-ST elevation MI Cardiac catheterization with critical left main disease, three-vessel disease Underwent CABG x3 Postop day 5 developed atrial fibrillation, given amiodarone, normal sinus rhythm restored LIMA to the LAD, vein graft to PDA, vein graft to OM Long history of GI issues, diarrhea, She presents today for follow-up of her coronary artery disease, frequent PVCs, hypertension, carotid arterial disease   In follow-up today she reports having some chronic shortness of breath In 2019: estimated ejection  fraction was in the range of 55% to 60%. Moderate mitral valve regurgitation, moderately elevated right heart pressures noted  She is sedentary, no exercise, walks to the mailbox  No significant leg swelling Weight up 25 pounds over the past 2 to 3 years through [place] Reports having difficulty losing weight  Denies chest pain concerning for angina Leg weakness, no falls  seen in the emergency room for chest pain November 2021 Cardiac enzymes negative  Lab work reviewed HAB1C 5.6 Total cho  121, LDL 41 Nonsmoker  EKG personally reviewed by myself on todays visit Shows normal sinus rhythm rate 71 bpm nonspecific ST abnormality   Other past medical history reviewed January 2020 admission to the hospital with chest pain, epigastric pain Hospital  records reviewed with the patient in detail Evaluated by cardiology during her admission, symptoms felt to be GI in etiology, possibly related to gallbladder  In February 2020 she was noted to have gallbladder disease, stones for which she underwent ERCP.   echocardiogram November 2019 reviewed with her. It showed ejection fraction of 55 - 60%   emergency room 07/03/2017 for hypertension Blood pressure 627 systolic at home  anxious  Long history of GI issues, diarrhea, has to take extra potassium daily Most recent potassium above 3.5 at the end of December 2016   Chronic leg weakness, does her ADLs, does not work in the garden anymore, unable to get up if she is on the ground   PMH:   has a past medical history of Anxiety, Atypical carcinoid lung tumor ([place]), CAD (coronary artery disease), Cancer ([place]) (yrs ago her pt), Carotid disease, bilateral ([place]), Cataracts, bilateral, Cholangitis, Choledocholithiasis, Cholelithiases, Chronic diarrhea, Depression, Frequent PVCs, Gilbert's syndrome, Headache, History of echocardiogram, HTN (hypertension), Hyperlipidemia, Impaired fasting blood sugar, Keloid, Macular degeneration, Osteoarthritis, Pneumonia, PONV (postoperative nausea and vomiting), Positional vertigo, Postoperative atrial fibrillation ([place]) (12/2016), S/P CABG x 3 (12/23/2016), TIA (transient ischemic attack), Wears glasses, and Wears hearing aid.  PSH:    Past Surgical History:  Procedure Laterality Date   ABDOMINAL AORTOGRAM N/A 12/22/2016   Procedure: ABDOMINAL AORTOGRAM;  Surgeon: Nelva Bush, MD;  Location: [place] [facility];  Service: Cardiovascular;  Laterality: N/A;   ABDOMINAL HYSTERECTOMY     BILIARY STENT PLACEMENT  12/10/2017  Procedure: BILIARY STENT PLACEMENT;  Surgeon: Rush Landmark Telford Nab., MD;  Location: [place];  Service: Gastroenterology;;   CHOLECYSTECTOMY     CORONARY ARTERY BYPASS GRAFT N/A 12/23/2016   Procedure: CORONARY ARTERY BYPASS GRAFTING  (CABG) TIMES THREE USING LEFT INTERNAL MAMMARY ARTERY AND LEFT SAPHENOUS LEG VEIN HARVESTED ENDOSCOPICALLY,;  Surgeon: Rexene Alberts, MD;  Location: [place];  Service: Open Heart Surgery;  Laterality: N/A;   ENDOSCOPIC RETROGRADE CHOLANGIOPANCREATOGRAPHY (ERCP) WITH PROPOFOL N/A 02/21/2018   Procedure: ENDOSCOPIC RETROGRADE CHOLANGIOPANCREATOGRAPHY (ERCP) WITH PROPOFOL;  Surgeon: Rush Landmark Telford Nab., MD;  Location: [place];  Service: Gastroenterology;  Laterality: N/A;   ERCP N/A 12/10/2017   Procedure: ENDOSCOPIC RETROGRADE CHOLANGIOPANCREATOGRAPHY (ERCP);  Surgeon: Irving Copas., MD;  Location: [place];  Service: Gastroenterology;  Laterality: N/A;   IABP INSERTION N/A 12/22/2016   Procedure: IABP Insertion;  Surgeon: Nelva Bush, MD;  Location: [place] [facility];  Service: Cardiovascular;  Laterality: N/A;   LEFT HEART CATH AND CORONARY ANGIOGRAPHY N/A 12/22/2016   Procedure: LEFT HEART CATH AND CORONARY ANGIOGRAPHY;  Surgeon: Nelva Bush, MD;  Location: [place] [facility];  Service: Cardiovascular;  Laterality: N/A;   LEFT HEART CATHETERIZATION WITH CORONARY ANGIOGRAM N/A 04/27/2012   Procedure: LEFT HEART CATHETERIZATION WITH CORONARY ANGIOGRAM;  Surgeon: Peter M Martinique, MD;  Location: [facility];  Service: Cardiovascular;  Laterality: N/A;   LOBECTOMY Right    RLL atypical carcinoid tumor - Dr Eugenia Mcalpine  1986   PCI of LAD  2001   with non des lad    pci of mid cfx     with non des and cutting balloon pci of av circumflex   REMOVAL OF STONES  02/21/2018   Procedure: REMOVAL OF STONES;  Surgeon: Irving Copas., MD;  Location: [place];  Service: Gastroenterology;;   [name]  02/21/2018   Procedure: [name];  Surgeon: Irving Copas., MD;  Location: [place];  Service: Gastroenterology;;   [name] REMOVAL  02/21/2018   Procedure: STENT REMOVAL;  Surgeon: Irving Copas., MD;  Location: [place];   Service: Gastroenterology;;   TEE WITHOUT CARDIOVERSION N/A 12/23/2016   Procedure: TRANSESOPHAGEAL ECHOCARDIOGRAM (TEE);  Surgeon: Rexene Alberts, MD;  Location: [place];  Service: Open Heart Surgery;  Laterality: N/A;    Current Outpatient Medications  Medication Sig Dispense Refill   acetaminophen (TYLENOL) 325 MG tablet Take 2 tablets (650 mg total) by mouth every 6 (six) hours as needed for mild pain or headache.     amLODipine (NORVASC) 5 MG tablet Take 5 mg by mouth. 1 tablet in the morning and half tablet at night.     aspirin 81 MG tablet Take 81 mg by mouth daily.     atorvastatin (LIPITOR) 40 MG tablet Take 40 mg by mouth daily.     calcium carbonate (TUMS EX) 750 MG chewable tablet Chew 2 tablets by mouth as needed for heartburn.     Calcium Carbonate-Vitamin D (CALCIUM 600 + D PO) Take 1 tablet by mouth daily. 600 mg/ 400 mg     estradiol (ESTRACE) 0.5 MG tablet Take 0.5 mg by mouth daily.     famotidine (PEPCID) 20 MG tablet Take 20 mg by mouth 2 (two) times daily.     furosemide (LASIX) 40 MG tablet TAKE 1 TABLET BY MOUTH EVERY DAY 90 tablet 3   KLOR-CON M20 20 MEQ tablet TAKE 1 TABLET BY MOUTH TWICE A DAY 180 tablet 2   LORazepam (ATIVAN) 0.5 MG  tablet Take 1 tablet (0.5 mg total) by mouth every 8 (eight) hours as needed for anxiety. 30 tablet 0   metoprolol succinate (TOPROL-XL) 50 MG 24 hr tablet TAKE 1 TABLET (50 MG TOTAL) BY MOUTH DAILY. TAKE WITH OR IMMEDIATELY FOLLOWING A MEAL. 90 tablet 3   Multiple Vitamin (MULTIVITAMIN) tablet Take 1 tablet by mouth daily.     nitroGLYCERIN (NITROSTAT) 0.4 MG SL tablet PLACE 1 TABLET (0.4 MG TOTAL) UNDER THE TONGUE EVERY 5 (FIVE) MINUTES AS NEEDED FOR CHEST PAIN. 25 tablet 1   Potassium Chloride ER 20 MEQ TBCR Take 20 mg by mouth in the morning and at bedtime.     telmisartan (MICARDIS) 80 MG tablet Take 1 tablet (80 mg total) by mouth daily. 90 tablet 3   meloxicam (MOBIC) 7.5 MG tablet Take one tablet daily for five days. Then take  as needed for pain. Further refills with primary care provider. (Patient not taking: Reported on 10/08/2020) 10 tablet 0   pantoprazole (PROTONIX) 40 MG tablet Take 40 mg by mouth daily. (Patient not taking: Reported on 10/08/2020)     No current facility-administered medications for this visit.     Allergies:   Sertraline hcl, Biaxin [clarithromycin], Celecoxib, Ciprofloxacin, Codeine, Hydromorphone, Lisinopril, Macrobid [nitrofurantoin monohyd macro], Oxycodone hcl, Prednisone, Sertraline hcl, and Penicillins   Social History:  The patient  reports that she has never smoked. She has never used smokeless tobacco. She reports that she does not drink alcohol and does not use drugs.   Family History:   family history includes CAD in her father and mother; Diabetes Mellitus I in her brother, mother, sister, and sister.    Review of Systems: Review of Systems  Constitutional: Negative.   Eyes: Negative.   Respiratory: Negative.  Negative for shortness of breath.   Cardiovascular: Negative.  Negative for chest pain.  Gastrointestinal: Negative.   Genitourinary: Negative.   Musculoskeletal: Negative.        Leg weakness  Neurological: Negative.   Psychiatric/Behavioral: Negative.    All other systems reviewed and are negative.  PHYSICAL EXAM: VS:  BP 140/70 (BP Location: Right Arm, Patient Position: Sitting, Cuff Size: Normal)   Pulse 71   Ht 5' 6.5" (1.689 m)   Wt 199 lb 6 oz (90.4 kg)   SpO2 97%   BMI 31.70 kg/m  , BMI Body mass index is 31.7 kg/m.  Constitutional:  oriented to person, place, and time. No distress.  HENT:  Head: Grossly normal Eyes:  no discharge. No scleral icterus.  Neck: No JVD, no carotid bruits  Cardiovascular: Regular rate and rhythm, 2/6 systolic ejection murmur right sternal border Pulmonary/Chest: Clear to auscultation bilaterally, no wheezes or rails Abdominal: Soft.  no distension.  no tenderness.  Musculoskeletal: Normal range of  motion Neurological:  normal muscle tone. Coordination normal. No atrophy Skin: Skin warm and dry Psychiatric: normal affect, pleasant  Recent Labs: 12/04/2019: ALT 24; BUN 15; Creatinine, Ser 0.85; Hemoglobin 13.1; Platelets 177; Potassium 3.8; Sodium 140    Lipid Panel Lab Results  Component Value Date   CHOL 121 02/04/2018   HDL 47 02/04/2018   LDLCALC 41 02/04/2018   TRIG 167 (H) 02/04/2018    Wt Readings from Last 3 Encounters:  10/08/20 199 lb 6 oz (90.4 kg)  04/08/20 200 lb (90.7 kg)  01/03/20 197 lb (89.4 kg)     ASSESSMENT AND PLAN:  Coronary artery disease involving native coronary artery of native heart with angina pectoris with documented spasm (  [facility])  CABG Three-vessel bypass surgery November 2018 Echocardiogram ordered for shortness of breath Continue aspirin, Lipitor, Lasix 40 daily, metoprolol, telmisartan  Essential hypertension  Blood pressure is well controlled on today's visit. No changes made to the medications.  PVC's (premature ventricular contractions) On metoprolol.  Significant symptoms  Left carotid stenosis Less than 39% blockage in the carotids Cholesterol at goal  Shortness of breath Sedentary, weight up 25 pounds past 3 years Repeat echocardiogram ordered given moderately elevated right heart pressures in 2019, moderate mitral valve regurgitation, worsening aortic valve stenosis murmur   Total encounter time more than 25 minutes  Greater than 50% was spent in counseling and coordination of care with the patient    Orders Placed This Encounter  Procedures   EKG 12-Lead     Signed, Esmond Plants, M.D., Ph.D. 10/08/2020  [place], [place]

## 2020-10-08 ENCOUNTER — Other Ambulatory Visit: Payer: Self-pay

## 2020-10-08 ENCOUNTER — Ambulatory Visit (INDEPENDENT_AMBULATORY_CARE_PROVIDER_SITE_OTHER): Payer: Medicare Other | Admitting: Cardiovascular Disease

## 2020-10-08 ENCOUNTER — Encounter: Payer: Self-pay | Admitting: Cardiovascular Disease

## 2020-10-08 VITALS — BP 140/70 | HR 71 | Ht 66.5 in | Wt 199.4 lb

## 2020-10-08 DIAGNOSIS — I25118 Atherosclerotic heart disease of native coronary artery with other forms of angina pectoris: Secondary | ICD-10-CM

## 2020-10-08 DIAGNOSIS — Z951 Presence of aortocoronary bypass graft: Secondary | ICD-10-CM | POA: Diagnosis not present

## 2020-10-08 DIAGNOSIS — R0602 Shortness of breath: Secondary | ICD-10-CM | POA: Diagnosis not present

## 2020-10-08 DIAGNOSIS — I6522 Occlusion and stenosis of left carotid artery: Secondary | ICD-10-CM

## 2020-10-08 DIAGNOSIS — I10 Essential (primary) hypertension: Secondary | ICD-10-CM

## 2020-10-08 DIAGNOSIS — I350 Nonrheumatic aortic (valve) stenosis: Secondary | ICD-10-CM | POA: Diagnosis not present

## 2020-10-08 DIAGNOSIS — I340 Nonrheumatic mitral (valve) insufficiency: Secondary | ICD-10-CM

## 2020-10-08 DIAGNOSIS — E782 Mixed hyperlipidemia: Secondary | ICD-10-CM

## 2020-10-08 MED ORDER — NITROGLYCERIN 0.4 MG SL SUBL: 0.4000 mg | SUBLINGUAL_TABLET | SUBLINGUAL | 1 refills | Status: AC | PRN

## 2020-10-08 MED ORDER — ATORVASTATIN CALCIUM 40 MG PO TABS
40.0000 mg | ORAL_TABLET | Freq: Every day | ORAL | 3 refills | Status: DC
Start: 2020-10-08 — End: 2021-12-23

## 2020-10-08 MED ORDER — METOPROLOL SUCCINATE ER 50 MG PO TB24
50.0000 mg | ORAL_TABLET | Freq: Every day | ORAL | 3 refills | Status: DC
Start: 2020-10-08 — End: 2021-09-25

## 2020-10-08 MED ORDER — FUROSEMIDE 40 MG PO TABS
40.0000 mg | ORAL_TABLET | Freq: Every day | ORAL | 3 refills | Status: DC
Start: 2020-10-08 — End: 2021-12-23

## 2020-10-08 MED ORDER — TELMISARTAN 80 MG PO TABS
80.0000 mg | ORAL_TABLET | Freq: Every day | ORAL | 3 refills | Status: DC
Start: 2020-10-08 — End: 2021-12-23

## 2020-10-08 MED ORDER — AMLODIPINE BESYLATE 5 MG PO TABS
5.0000 mg | ORAL_TABLET | Freq: Two times a day (BID) | ORAL | 3 refills | Status: DC
Start: 2020-10-08 — End: 2023-02-15

## 2020-10-08 MED ORDER — POTASSIUM CHLORIDE CRYS ER 20 MEQ PO TBCR
20.0000 meq | EXTENDED_RELEASE_TABLET | Freq: Two times a day (BID) | ORAL | 3 refills | Status: DC
Start: 2020-10-08 — End: 2021-11-06

## 2020-10-08 NOTE — Patient Instructions (Addendum)
Medication Instructions:  No changes  If you need a refill on your cardiac medications before your next appointment, please call your pharmacy.   Lab work: No new labs needed  Testing/Procedures: ECHO Your physician has requested that you have an echocardiogram. Echocardiography is a painless test that uses sound waves to create images of your heart. It provides your doctor with information about the size and shape of your heart and how well your heart's chambers and valves are working. This procedure takes approximately one hour. There are no restrictions for this procedure.  There is a possibility that an IV may need to be started during your test to inject an image enhancing agent. This is done to obtain more optimal pictures of your heart. Therefore we ask that you do at least drink some water prior to coming in to hydrate your veins.    Follow-Up: At [facility], you and your health needs are our priority.  As part of our continuing mission to provide you with exceptional heart care, we have created designated Provider Care Teams.  These Care Teams include your primary Cardiologist (physician) and Advanced Practice Providers (APPs -  Physician Assistants and Nurse Practitioners) who all work together to provide you with the care you need, when you need it.  You will need a follow up appointment in 6 months  Providers on your designated Care Team:   Murray Hodgkins, NP Christell Faith, PA-C Marrianne Mood, PA-C Cadence [place], [place]  COVID-19 Vaccine Information can be found at: [email or web address].uk For questions related to vaccine distribution or appointments, please email [email or web address] or call [phone].

## 2020-10-18 DIAGNOSIS — Z23 Encounter for immunization: Secondary | ICD-10-CM | POA: Diagnosis not present

## 2020-10-18 DIAGNOSIS — Z0000 Encounter for general adult medical examination without abnormal findings: Secondary | ICD-10-CM | POA: Diagnosis not present

## 2020-10-18 DIAGNOSIS — Z1389 Encounter for screening for other disorder: Secondary | ICD-10-CM | POA: Diagnosis not present

## 2020-11-05 DIAGNOSIS — E7800 Pure hypercholesterolemia, unspecified: Secondary | ICD-10-CM | POA: Diagnosis not present

## 2020-11-05 DIAGNOSIS — Z79899 Other long term (current) drug therapy: Secondary | ICD-10-CM | POA: Diagnosis not present

## 2020-11-05 DIAGNOSIS — I129 Hypertensive chronic kidney disease with stage 1 through stage 4 chronic kidney disease, or unspecified chronic kidney disease: Secondary | ICD-10-CM | POA: Diagnosis not present

## 2020-11-05 DIAGNOSIS — I700 Atherosclerosis of aorta: Secondary | ICD-10-CM | POA: Diagnosis not present

## 2020-11-05 DIAGNOSIS — N1831 Chronic kidney disease, stage 3a: Secondary | ICD-10-CM | POA: Diagnosis not present

## 2020-11-05 DIAGNOSIS — Z7189 Other specified counseling: Secondary | ICD-10-CM | POA: Diagnosis not present

## 2020-11-14 ENCOUNTER — Ambulatory Visit (INDEPENDENT_AMBULATORY_CARE_PROVIDER_SITE_OTHER): Payer: Medicare Other

## 2020-11-14 ENCOUNTER — Other Ambulatory Visit: Payer: Self-pay

## 2020-11-14 DIAGNOSIS — I340 Nonrheumatic mitral (valve) insufficiency: Secondary | ICD-10-CM | POA: Diagnosis not present

## 2020-11-14 DIAGNOSIS — Z951 Presence of aortocoronary bypass graft: Secondary | ICD-10-CM

## 2020-11-14 DIAGNOSIS — I25118 Atherosclerotic heart disease of native coronary artery with other forms of angina pectoris: Secondary | ICD-10-CM | POA: Diagnosis not present

## 2020-11-14 DIAGNOSIS — I350 Nonrheumatic aortic (valve) stenosis: Secondary | ICD-10-CM

## 2020-11-14 DIAGNOSIS — R0602 Shortness of breath: Secondary | ICD-10-CM

## 2020-11-14 LAB — ECHOCARDIOGRAM COMPLETE
AR max vel: 1.21 cm2
AV Area VTI: 1.35 cm2
AV Area mean vel: 1.26 cm2
AV Mean grad: 6 mmHg
AV Peak grad: 13 mmHg
Ao pk vel: 1.81 m/s
Area-P 1/2: 2.32 cm2
MV M vel: 5.77 m/s
MV Peak grad: 133.2 mmHg
S' Lateral: 2.4 cm

## 2021-04-08 ENCOUNTER — Ambulatory Visit: Payer: Medicare Other | Admitting: Cardiovascular Disease

## 2021-05-07 DIAGNOSIS — I209 Angina pectoris, unspecified: Secondary | ICD-10-CM | POA: Diagnosis not present

## 2021-05-07 DIAGNOSIS — M25511 Pain in right shoulder: Secondary | ICD-10-CM | POA: Diagnosis not present

## 2021-05-07 DIAGNOSIS — I272 Pulmonary hypertension, unspecified: Secondary | ICD-10-CM | POA: Diagnosis not present

## 2021-05-07 DIAGNOSIS — I129 Hypertensive chronic kidney disease with stage 1 through stage 4 chronic kidney disease, or unspecified chronic kidney disease: Secondary | ICD-10-CM | POA: Diagnosis not present

## 2021-05-07 DIAGNOSIS — I700 Atherosclerosis of aorta: Secondary | ICD-10-CM | POA: Diagnosis not present

## 2021-05-07 DIAGNOSIS — E7800 Pure hypercholesterolemia, unspecified: Secondary | ICD-10-CM | POA: Diagnosis not present

## 2021-05-07 DIAGNOSIS — R29898 Other symptoms and signs involving the musculoskeletal system: Secondary | ICD-10-CM | POA: Diagnosis not present

## 2021-05-07 DIAGNOSIS — N1831 Chronic kidney disease, stage 3a: Secondary | ICD-10-CM | POA: Diagnosis not present

## 2021-05-11 NOTE — Progress Notes (Signed)
I Cardiology Office Note ? ?Date:  05/12/2021  ? ?ID:  [name], DOB 12/03/1930, MRN [id number] ? ?PCP:  Lajean Manes, MD  ? ?Chief Complaint  ?Patient presents with  ? 6 month follow up   ?  Patient c/o shortness of breath with over exertion, LE edema and chest pain at times. Medications reviewed by the patient verbally.   ? ? ?HPI:  ?Ms. [name] is a 88 y.o. woman with  ?CAD,  ?prior PCI of the LAD in 2001 with non DES, PCI of mid LCX with non des  ?chronic chest pain episodes,   ?cardiac catheterization April 2014 showing occluded RCA, patent LAD and left circumflex and  ?50% carotid disease on the left.  ?Hospital December 2018 with chest pain, non-ST elevation MI ?Cardiac catheterization with critical left main disease, three-vessel disease ?Underwent CABG x3 ?Postop day 5 developed atrial fibrillation, given amiodarone, normal sinus rhythm restored ?LIMA to the LAD, vein graft to PDA, vein graft to OM ?Long history of GI issues, diarrhea, ?She presents today for follow-up of [name] coronary artery disease, frequent PVCs, hypertension, carotid arterial disease ? ?LOV 9/22 ?On that visit reported having shortness of breath ? ?Echocardiogram performed November 2022 ?Left ventricular ejection fraction, by estimation, is 55 to 60%. The  ?left ventricle has normal function. The left ventricle has no regional  ?wall motion abnormalities. Left ventricular diastolic parameters are  ?consistent with Grade I diastolic  ?dysfunction (impaired relaxation).  ?Right ventricular systolic function is normal. The right ventricular  ?size is normal. There is normal pulmonary artery systolic pressure. The  ?estimated right ventricular systolic pressure is 28.3 mmHg.  ? 4. The mitral valve is normal in structure. Mild mitral valve  ?regurgitation. No evidence of mitral stenosis.  ? 5. Tricuspid valve regurgitation is mild to moderate.  ? 6. The aortic valve was not well visualized. There is moderate  ?calcification of the aortic  valve. Aortic valve regurgitation is not  ?visualized. Mild to moderate aortic valve sclerosis/calcification is  ?present, without any evidence of aortic stenosis.  ? 7. The inferior vena cava is normal in size with greater than 50%  ?respiratory variability, suggesting right atrial pressure of 3 mmHg. ? ?In follow-up today reports continued weakness, poor sleep, general fatigue ?Does not do much exercise, walking [name] family presents with [name] today ?On the Lasix daily ?No recent lab work available in the system ?Work-up requested from primary care ? ?Remains sedentary, no exercise ?On last clinic visit weight was running higher ?25 pound weight gain through [place] noted on [name] last clinic visit ? ?Rare chest pain, "from scar tissue",. Has keloids lower end of [name] incision ? ?PT ordered by PMD, legs are weak, noted on [name] last clinic visit ? no falls ? ?Prior labs available, no recent labs for review ?HAB1C 5.6 ?Total cho  121, LDL 41 ? ?EKG personally reviewed by myself on todays visit ?Shows normal sinus rhythm rate 69 bpm nonspecific ST abnormality, PACs ? ?Other past medical history reviewed ?January 2020 admission to the hospital with chest pain, epigastric pain ?Hospital records reviewed with the patient in detail ?Evaluated by cardiology during [name] admission, symptoms felt to be GI in etiology, possibly related to gallbladder ? ?In February 2020 she was noted to have gallbladder disease, stones for which she underwent ERCP.  ? ?echocardiogram November 2019 reviewed with [name]. It showed ejection fraction of 55 - 60%  ? ?emergency room 07/03/2017 for hypertension ?Blood pressure 662 systolic at home ?  anxious ? ?Long history of GI issues, diarrhea, has to take extra potassium daily ?Most recent potassium above 3.5 at the end of December 2016 ?  ?Chronic leg weakness, does [name] ADLs, does not work in the garden anymore, unable to get up if she is on the ground ? ? ?PMH:   has a past medical history of Anxiety,  Atypical carcinoid lung tumor ([place]), CAD (coronary artery disease), Cancer ([place]) (yrs ago [name] pt), Carotid disease, bilateral ([place]), Cataracts, bilateral, Cholangitis, Choledocholithiasis, Cholelithiases, Chronic diarrhea, Depression, Frequent PVCs, Gilbert's syndrome, Headache, History of echocardiogram, HTN (hypertension), Hyperlipidemia, Impaired fasting blood sugar, Keloid, Macular degeneration, Osteoarthritis, Pneumonia, PONV (postoperative nausea and vomiting), Positional vertigo, Postoperative atrial fibrillation ([place]) (12/2016), S/P CABG x 3 (12/23/2016), TIA (transient ischemic attack), Wears glasses, and Wears hearing aid. ? ?PSH:    ?Past Surgical History:  ?Procedure Laterality Date  ? ABDOMINAL AORTOGRAM N/A 12/22/2016  ? Procedure: ABDOMINAL AORTOGRAM;  Surgeon: Nelva Bush, MD;  Location: [place] [facility];  Service: Cardiovascular;  Laterality: N/A;  ? ABDOMINAL HYSTERECTOMY    ? BILIARY STENT PLACEMENT  12/10/2017  ? Procedure: BILIARY STENT PLACEMENT;  Surgeon: Rush Landmark Telford Nab., MD;  Location: [place];  Service: Gastroenterology;;  ? CHOLECYSTECTOMY    ? CORONARY ARTERY BYPASS GRAFT N/A 12/23/2016  ? Procedure: CORONARY ARTERY BYPASS GRAFTING (CABG) TIMES THREE USING LEFT INTERNAL MAMMARY ARTERY AND LEFT SAPHENOUS LEG VEIN HARVESTED ENDOSCOPICALLY,;  Surgeon: Rexene Alberts, MD;  Location: [place];  Service: Open Heart Surgery;  Laterality: N/A;  ? ENDOSCOPIC RETROGRADE CHOLANGIOPANCREATOGRAPHY (ERCP) WITH PROPOFOL N/A 02/21/2018  ? Procedure: ENDOSCOPIC RETROGRADE CHOLANGIOPANCREATOGRAPHY (ERCP) WITH PROPOFOL;  Surgeon: Rush Landmark Telford Nab., MD;  Location: [place];  Service: Gastroenterology;  Laterality: N/A;  ? ERCP N/A 12/10/2017  ? Procedure: ENDOSCOPIC RETROGRADE CHOLANGIOPANCREATOGRAPHY (ERCP);  Surgeon: Irving Copas., MD;  Location: [place];  Service: Gastroenterology;  Laterality: N/A;  ? IABP INSERTION N/A 12/22/2016  ? Procedure: IABP Insertion;   Surgeon: Nelva Bush, MD;  Location: [place] [facility];  Service: Cardiovascular;  Laterality: N/A;  ? LEFT HEART CATH AND CORONARY ANGIOGRAPHY N/A 12/22/2016  ? Procedure: LEFT HEART CATH AND CORONARY ANGIOGRAPHY;  Surgeon: Nelva Bush, MD;  Location: [place] [facility];  Service: Cardiovascular;  Laterality: N/A;  ? LEFT HEART CATHETERIZATION WITH CORONARY ANGIOGRAM N/A 04/27/2012  ? Procedure: LEFT HEART CATHETERIZATION WITH CORONARY ANGIOGRAM;  Surgeon: Peter M Martinique, MD;  Location: [facility];  Service: Cardiovascular;  Laterality: N/A;  ? LOBECTOMY Right   ? RLL atypical carcinoid tumor - Dr Arlyce Dice  ? pci  1986  ? PCI of LAD  2001  ? with non des lad   ? pci of mid cfx    ? with non des and cutting balloon pci of av circumflex  ? REMOVAL OF STONES  02/21/2018  ? Procedure: REMOVAL OF STONES;  Surgeon: Rush Landmark Telford Nab., MD;  Location: [place];  Service: Gastroenterology;;  ? SPHINCTEROTOMY  02/21/2018  ? Procedure: SPHINCTEROTOMY;  Surgeon: Rush Landmark Telford Nab., MD;  Location: [place];  Service: Gastroenterology;;  ? [place]  02/21/2018  ? Procedure: STENT REMOVAL;  Surgeon: Irving Copas., MD;  Location: [place];  Service: Gastroenterology;;  ? TEE WITHOUT CARDIOVERSION N/A 12/23/2016  ? Procedure: TRANSESOPHAGEAL ECHOCARDIOGRAM (TEE);  Surgeon: Rexene Alberts, MD;  Location: [place];  Service: Open Heart Surgery;  Laterality: N/A;  ? ? ?Current Outpatient Medications  ?Medication Sig Dispense Refill  ? acetaminophen (TYLENOL) 325 MG tablet  Take 2 tablets (650 mg total) by mouth every 6 (six) hours as needed for mild pain or headache.    ? amLODipine (NORVASC) 5 MG tablet Take 1 tablet (5 mg total) by mouth 2 (two) times daily. 1 tablet in the morning and half tablet at night. 180 tablet 3  ? aspirin 81 MG tablet Take 81 mg by mouth daily.    ? atorvastatin (LIPITOR) 40 MG tablet Take 1 tablet (40 mg total) by mouth daily. 90 tablet 3  ? calcium carbonate  (TUMS EX) 750 MG chewable tablet Chew 2 tablets by mouth as needed for heartburn.    ? Calcium Carbonate-Vitamin D (CALCIUM 600 + D PO) Take 1 tablet by mouth daily. 600 mg/ 400 mg    ? estradiol (ESTRACE) 0.5

## 2021-05-12 ENCOUNTER — Ambulatory Visit (INDEPENDENT_AMBULATORY_CARE_PROVIDER_SITE_OTHER): Payer: Medicare Other | Admitting: Cardiovascular Disease

## 2021-05-12 ENCOUNTER — Encounter: Payer: Self-pay | Admitting: Cardiovascular Disease

## 2021-05-12 VITALS — BP 130/80 | HR 69 | Ht 66.0 in | Wt 201.2 lb

## 2021-05-12 DIAGNOSIS — I25118 Atherosclerotic heart disease of native coronary artery with other forms of angina pectoris: Secondary | ICD-10-CM | POA: Diagnosis not present

## 2021-05-12 DIAGNOSIS — I350 Nonrheumatic aortic (valve) stenosis: Secondary | ICD-10-CM | POA: Diagnosis not present

## 2021-05-12 DIAGNOSIS — E782 Mixed hyperlipidemia: Secondary | ICD-10-CM

## 2021-05-12 DIAGNOSIS — R0602 Shortness of breath: Secondary | ICD-10-CM

## 2021-05-12 DIAGNOSIS — I340 Nonrheumatic mitral (valve) insufficiency: Secondary | ICD-10-CM | POA: Diagnosis not present

## 2021-05-12 DIAGNOSIS — Z951 Presence of aortocoronary bypass graft: Secondary | ICD-10-CM

## 2021-05-12 DIAGNOSIS — I10 Essential (primary) hypertension: Secondary | ICD-10-CM | POA: Diagnosis not present

## 2021-05-12 DIAGNOSIS — I6522 Occlusion and stenosis of left carotid artery: Secondary | ICD-10-CM

## 2021-05-12 NOTE — Patient Instructions (Addendum)
Labs from PMD for [facility] ? ?Medication Instructions:  ?No changes ? ?If you need a refill on your cardiac medications before your next appointment, please call your pharmacy.  ? ?Lab work: ?No new labs needed ? ?Testing/Procedures: ?No new testing needed ? ?Follow-Up: ?At [facility], you and your health needs are our priority.  As part of our continuing mission to provide you with exceptional heart care, we have created designated Provider Care Teams.  These Care Teams include your primary Cardiologist (physician) and Advanced Practice Providers (APPs -  Physician Assistants and Nurse Practitioners) who all work together to provide you with the care you need, when you need it. ? ?You will need a follow up appointment in 6 months ? ?Providers on your designated Care Team:   ?Murray Hodgkins, NP ?Christell Faith, PA-C ?Cadence Kathlen Mody, PA-C ? ?COVID-19 Vaccine Information can be found at: ShippingScam.co.uk For questions related to vaccine distribution or appointments, please email [email or web address] or call [phone].  ? ?

## 2021-05-19 DIAGNOSIS — R2681 Unsteadiness on feet: Secondary | ICD-10-CM | POA: Diagnosis not present

## 2021-05-19 DIAGNOSIS — M6281 Muscle weakness (generalized): Secondary | ICD-10-CM | POA: Diagnosis not present

## 2021-05-19 DIAGNOSIS — R2689 Other abnormalities of gait and mobility: Secondary | ICD-10-CM | POA: Diagnosis not present

## 2021-05-23 DIAGNOSIS — R2689 Other abnormalities of gait and mobility: Secondary | ICD-10-CM | POA: Diagnosis not present

## 2021-05-23 DIAGNOSIS — M6281 Muscle weakness (generalized): Secondary | ICD-10-CM | POA: Diagnosis not present

## 2021-05-23 DIAGNOSIS — R2681 Unsteadiness on feet: Secondary | ICD-10-CM | POA: Diagnosis not present

## 2021-05-26 DIAGNOSIS — R2681 Unsteadiness on feet: Secondary | ICD-10-CM | POA: Diagnosis not present

## 2021-05-26 DIAGNOSIS — M6281 Muscle weakness (generalized): Secondary | ICD-10-CM | POA: Diagnosis not present

## 2021-05-26 DIAGNOSIS — R2689 Other abnormalities of gait and mobility: Secondary | ICD-10-CM | POA: Diagnosis not present

## 2021-05-30 DIAGNOSIS — R2689 Other abnormalities of gait and mobility: Secondary | ICD-10-CM | POA: Diagnosis not present

## 2021-05-30 DIAGNOSIS — M6281 Muscle weakness (generalized): Secondary | ICD-10-CM | POA: Diagnosis not present

## 2021-05-30 DIAGNOSIS — R2681 Unsteadiness on feet: Secondary | ICD-10-CM | POA: Diagnosis not present

## 2021-06-06 DIAGNOSIS — R2681 Unsteadiness on feet: Secondary | ICD-10-CM | POA: Diagnosis not present

## 2021-06-06 DIAGNOSIS — R2689 Other abnormalities of gait and mobility: Secondary | ICD-10-CM | POA: Diagnosis not present

## 2021-06-06 DIAGNOSIS — M6281 Muscle weakness (generalized): Secondary | ICD-10-CM | POA: Diagnosis not present

## 2021-07-07 DIAGNOSIS — I129 Hypertensive chronic kidney disease with stage 1 through stage 4 chronic kidney disease, or unspecified chronic kidney disease: Secondary | ICD-10-CM | POA: Diagnosis not present

## 2021-07-07 DIAGNOSIS — H353122 Nonexudative age-related macular degeneration, left eye, intermediate dry stage: Secondary | ICD-10-CM | POA: Diagnosis not present

## 2021-07-07 DIAGNOSIS — K219 Gastro-esophageal reflux disease without esophagitis: Secondary | ICD-10-CM | POA: Diagnosis not present

## 2021-07-07 DIAGNOSIS — I272 Pulmonary hypertension, unspecified: Secondary | ICD-10-CM | POA: Diagnosis not present

## 2021-07-07 DIAGNOSIS — E7800 Pure hypercholesterolemia, unspecified: Secondary | ICD-10-CM | POA: Diagnosis not present

## 2021-07-07 DIAGNOSIS — I2510 Atherosclerotic heart disease of native coronary artery without angina pectoris: Secondary | ICD-10-CM | POA: Diagnosis not present

## 2021-07-07 DIAGNOSIS — N1831 Chronic kidney disease, stage 3a: Secondary | ICD-10-CM | POA: Diagnosis not present

## 2021-07-07 DIAGNOSIS — F3340 Major depressive disorder, recurrent, in remission, unspecified: Secondary | ICD-10-CM | POA: Diagnosis not present

## 2021-09-24 ENCOUNTER — Observation Stay
Admission: EM | Admit: 2021-09-24 | Discharge: 2021-09-25 | Disposition: A | Discharge status: Home / Self Care | Payer: Medicare Other | Attending: Internal Medicine | Admitting: Internal Medicine

## 2021-09-24 ENCOUNTER — Emergency Department: Payer: Medicare Other

## 2021-09-24 ENCOUNTER — Other Ambulatory Visit: Payer: Self-pay

## 2021-09-24 ENCOUNTER — Observation Stay (HOSPITAL_BASED_OUTPATIENT_CLINIC_OR_DEPARTMENT_OTHER)
Admit: 2021-09-24 | Discharge: 2021-09-24 | Disposition: A | Discharge status: Home / Self Care | Payer: Medicare Other | Attending: Cardiology | Admitting: Cardiology

## 2021-09-24 DIAGNOSIS — R011 Cardiac murmur, unspecified: Secondary | ICD-10-CM | POA: Diagnosis not present

## 2021-09-24 DIAGNOSIS — Z951 Presence of aortocoronary bypass graft: Secondary | ICD-10-CM

## 2021-09-24 DIAGNOSIS — E669 Obesity, unspecified: Secondary | ICD-10-CM | POA: Diagnosis not present

## 2021-09-24 DIAGNOSIS — R338 Other retention of urine: Secondary | ICD-10-CM | POA: Diagnosis present

## 2021-09-24 DIAGNOSIS — R0609 Other forms of dyspnea: Secondary | ICD-10-CM | POA: Diagnosis not present

## 2021-09-24 DIAGNOSIS — Z7982 Long term (current) use of aspirin: Secondary | ICD-10-CM | POA: Diagnosis not present

## 2021-09-24 DIAGNOSIS — R079 Chest pain, unspecified: Secondary | ICD-10-CM | POA: Diagnosis not present

## 2021-09-24 DIAGNOSIS — I498 Other specified cardiac arrhythmias: Secondary | ICD-10-CM

## 2021-09-24 DIAGNOSIS — R000 Tachycardia, unspecified: Secondary | ICD-10-CM | POA: Diagnosis not present

## 2021-09-24 DIAGNOSIS — Z20822 Contact with and (suspected) exposure to covid-19: Secondary | ICD-10-CM | POA: Insufficient documentation

## 2021-09-24 DIAGNOSIS — Z6830 Body mass index (BMI) 30.0-30.9, adult: Secondary | ICD-10-CM | POA: Insufficient documentation

## 2021-09-24 DIAGNOSIS — I491 Atrial premature depolarization: Secondary | ICD-10-CM | POA: Diagnosis not present

## 2021-09-24 DIAGNOSIS — I110 Hypertensive heart disease with heart failure: Secondary | ICD-10-CM | POA: Insufficient documentation

## 2021-09-24 DIAGNOSIS — I25118 Atherosclerotic heart disease of native coronary artery with other forms of angina pectoris: Secondary | ICD-10-CM | POA: Insufficient documentation

## 2021-09-24 DIAGNOSIS — Z85118 Personal history of other malignant neoplasm of bronchus and lung: Secondary | ICD-10-CM | POA: Insufficient documentation

## 2021-09-24 DIAGNOSIS — I361 Nonrheumatic tricuspid (valve) insufficiency: Secondary | ICD-10-CM | POA: Insufficient documentation

## 2021-09-24 DIAGNOSIS — R072 Precordial pain: Secondary | ICD-10-CM

## 2021-09-24 DIAGNOSIS — I259 Chronic ischemic heart disease, unspecified: Secondary | ICD-10-CM

## 2021-09-24 DIAGNOSIS — Z8673 Personal history of transient ischemic attack (TIA), and cerebral infarction without residual deficits: Secondary | ICD-10-CM | POA: Insufficient documentation

## 2021-09-24 DIAGNOSIS — I5033 Acute on chronic diastolic (congestive) heart failure: Secondary | ICD-10-CM | POA: Diagnosis not present

## 2021-09-24 DIAGNOSIS — R0602 Shortness of breath: Secondary | ICD-10-CM

## 2021-09-24 DIAGNOSIS — F419 Anxiety disorder, unspecified: Secondary | ICD-10-CM | POA: Diagnosis present

## 2021-09-24 DIAGNOSIS — R5381 Other malaise: Secondary | ICD-10-CM | POA: Insufficient documentation

## 2021-09-24 DIAGNOSIS — I10 Essential (primary) hypertension: Secondary | ICD-10-CM | POA: Diagnosis not present

## 2021-09-24 DIAGNOSIS — Z79899 Other long term (current) drug therapy: Secondary | ICD-10-CM | POA: Diagnosis not present

## 2021-09-24 DIAGNOSIS — R0789 Other chest pain: Secondary | ICD-10-CM | POA: Diagnosis not present

## 2021-09-24 LAB — BRAIN NATRIURETIC PEPTIDE: B Natriuretic Peptide: 399.2 pg/mL — ABNORMAL HIGH (ref 0.0–100.0)

## 2021-09-24 LAB — CBC WITH DIFFERENTIAL/PLATELET
Abs Immature Granulocytes: 0.02 10*3/uL (ref 0.00–0.07)
Basophils Absolute: 0 10*3/uL (ref 0.0–0.1)
Basophils Relative: 1 %
Eosinophils Absolute: 0.1 10*3/uL (ref 0.0–0.5)
Eosinophils Relative: 1 %
HCT: 39.1 % (ref 36.0–46.0)
Hemoglobin: 13.1 g/dL (ref 12.0–15.0)
Immature Granulocytes: 0 %
Lymphocytes Relative: 17 %
Lymphs Abs: 1.3 10*3/uL (ref 0.7–4.0)
MCH: 30.5 pg (ref 26.0–34.0)
MCHC: 33.5 g/dL (ref 30.0–36.0)
MCV: 90.9 fL (ref 80.0–100.0)
Monocytes Absolute: 0.6 10*3/uL (ref 0.1–1.0)
Monocytes Relative: 8 %
Neutro Abs: 6 10*3/uL (ref 1.7–7.7)
Neutrophils Relative %: 73 %
Platelets: 153 10*3/uL (ref 150–400)
RBC: 4.3 MIL/uL (ref 3.87–5.11)
RDW: 12.4 % (ref 11.5–15.5)
WBC: 8.1 10*3/uL (ref 4.0–10.5)
nRBC: 0 % (ref 0.0–0.2)

## 2021-09-24 LAB — MAGNESIUM: Magnesium: 1.9 mg/dL (ref 1.7–2.4)

## 2021-09-24 LAB — COMPREHENSIVE METABOLIC PANEL
ALT: 23 U/L (ref 0–44)
AST: 28 U/L (ref 15–41)
Albumin: 3.8 g/dL (ref 3.5–5.0)
Alkaline Phosphatase: 74 U/L (ref 38–126)
Anion gap: 9 (ref 5–15)
BUN: 15 mg/dL (ref 8–23)
CO2: 24 mmol/L (ref 22–32)
Calcium: 9.1 mg/dL (ref 8.9–10.3)
Chloride: 104 mmol/L (ref 98–111)
Creatinine, Ser: 1.02 mg/dL — ABNORMAL HIGH (ref 0.44–1.00)
GFR, Estimated: 53 mL/min — ABNORMAL LOW (ref >60)
Glucose, Bld: 134 mg/dL — ABNORMAL HIGH (ref 70–99)
Potassium: 3.7 mmol/L (ref 3.5–5.1)
Sodium: 137 mmol/L (ref 135–145)
Total Bilirubin: 1.7 mg/dL — ABNORMAL HIGH (ref 0.3–1.2)
Total Protein: 6.5 g/dL (ref 6.5–8.1)

## 2021-09-24 LAB — D-DIMER, QUANTITATIVE: D-Dimer, Quant: 1.22 ug/mL-FEU — ABNORMAL HIGH (ref 0.00–0.50)

## 2021-09-24 LAB — TROPONIN I (HIGH SENSITIVITY)
Troponin I (High Sensitivity): 12 ng/L (ref <18)
Troponin I (High Sensitivity): 17 ng/L (ref <18)

## 2021-09-24 LAB — SARS CORONAVIRUS 2 BY RT PCR: SARS Coronavirus 2 by RT PCR: NEGATIVE

## 2021-09-24 MED ORDER — ONDANSETRON HCL 4 MG PO TABS: 4.0000 mg | ORAL_TABLET | Freq: Four times a day (QID) | ORAL | Status: DC | PRN

## 2021-09-24 MED ORDER — AMLODIPINE BESYLATE 5 MG PO TABS: 5.0000 mg | ORAL_TABLET | Freq: Every day | ORAL | Status: DC

## 2021-09-24 MED ORDER — LORAZEPAM 0.5 MG PO TABS
0.5000 mg | ORAL_TABLET | Freq: Three times a day (TID) | ORAL | Status: DC | PRN
  Administered 2021-09-24: 0.5 mg via ORAL
  Filled 2021-09-24: qty 1

## 2021-09-24 MED ORDER — ESTRADIOL 1 MG PO TABS
0.5000 mg | ORAL_TABLET | Freq: Every day | ORAL | Status: DC
  Administered 2021-09-25: 0.5 mg via ORAL
  Filled 2021-09-24: qty 0.5

## 2021-09-24 MED ORDER — METOPROLOL TARTRATE 50 MG PO TABS
50.0000 mg | ORAL_TABLET | Freq: Two times a day (BID) | ORAL | Status: DC
  Administered 2021-09-24: 50 mg via ORAL
  Filled 2021-09-24 (×3): qty 1

## 2021-09-24 MED ORDER — AMLODIPINE BESYLATE 5 MG PO TABS: 2.5000 mg | ORAL_TABLET | Freq: Every day | ORAL | Status: DC

## 2021-09-24 MED ORDER — IRBESARTAN 150 MG PO TABS
300.0000 mg | ORAL_TABLET | Freq: Every day | ORAL | Status: DC
  Administered 2021-09-24 – 2021-09-25 (×2): 300 mg via ORAL
  Filled 2021-09-24 (×3): qty 2

## 2021-09-24 MED ORDER — ATORVASTATIN CALCIUM 20 MG PO TABS
40.0000 mg | ORAL_TABLET | Freq: Every evening | ORAL | Status: DC
  Administered 2021-09-24: 40 mg via ORAL
  Filled 2021-09-24: qty 2

## 2021-09-24 MED ORDER — CALCIUM CARBONATE ANTACID 500 MG PO CHEW: 2.0000 | CHEWABLE_TABLET | ORAL | Status: DC | PRN

## 2021-09-24 MED ORDER — ADULT MULTIVITAMIN W/MINERALS CH
1.0000 | ORAL_TABLET | Freq: Every day | ORAL | Status: DC
  Administered 2021-09-25: 1 via ORAL
  Filled 2021-09-24: qty 1

## 2021-09-24 MED ORDER — PANTOPRAZOLE SODIUM 40 MG PO TBEC
40.0000 mg | DELAYED_RELEASE_TABLET | Freq: Every day | ORAL | Status: DC
  Administered 2021-09-24 – 2021-09-25 (×2): 40 mg via ORAL
  Filled 2021-09-24 (×2): qty 1

## 2021-09-24 MED ORDER — ENOXAPARIN SODIUM 60 MG/0.6ML IJ SOSY
0.5000 mg/kg | PREFILLED_SYRINGE | INTRAMUSCULAR | Status: DC
  Administered 2021-09-24: 45 mg via SUBCUTANEOUS
  Filled 2021-09-24: qty 0.6

## 2021-09-24 MED ORDER — FUROSEMIDE 40 MG PO TABS
40.0000 mg | ORAL_TABLET | Freq: Every day | ORAL | Status: DC
  Administered 2021-09-25: 40 mg via ORAL
  Filled 2021-09-24: qty 1

## 2021-09-24 MED ORDER — METOPROLOL SUCCINATE ER 50 MG PO TB24: 50.0000 mg | ORAL_TABLET | Freq: Every day | ORAL | Status: DC

## 2021-09-24 MED ORDER — OYSTER SHELL CALCIUM/D3 500-5 MG-MCG PO TABS
1.0000 | ORAL_TABLET | Freq: Every day | ORAL | Status: DC
  Administered 2021-09-25: 1 via ORAL
  Filled 2021-09-24: qty 1

## 2021-09-24 MED ORDER — ISOSORBIDE MONONITRATE ER 30 MG PO TB24
15.0000 mg | ORAL_TABLET | Freq: Every day | ORAL | Status: DC
  Filled 2021-09-24: qty 1

## 2021-09-24 MED ORDER — SODIUM CHLORIDE 0.9% FLUSH: 3.0000 mL | INTRAVENOUS | Status: DC | PRN

## 2021-09-24 MED ORDER — ALUM & MAG HYDROXIDE-SIMETH 200-200-20 MG/5ML PO SUSP: 30.0000 mL | Freq: Once | ORAL | Status: DC

## 2021-09-24 MED ORDER — SODIUM CHLORIDE 0.9 % IV SOLN: 250.0000 mL | INTRAVENOUS | Status: DC | PRN

## 2021-09-24 MED ORDER — FUROSEMIDE 10 MG/ML IJ SOLN
40.0000 mg | Freq: Once | INTRAMUSCULAR | Status: AC
  Administered 2021-09-24: 40 mg via INTRAVENOUS
  Filled 2021-09-24: qty 4

## 2021-09-24 MED ORDER — ACETAMINOPHEN 325 MG PO TABS
650.0000 mg | ORAL_TABLET | Freq: Four times a day (QID) | ORAL | Status: DC | PRN
  Administered 2021-09-24 – 2021-09-25 (×2): 650 mg via ORAL
  Filled 2021-09-24 (×2): qty 2

## 2021-09-24 MED ORDER — ASPIRIN 81 MG PO TBEC
81.0000 mg | DELAYED_RELEASE_TABLET | Freq: Every day | ORAL | Status: DC
  Administered 2021-09-24 – 2021-09-25 (×2): 81 mg via ORAL
  Filled 2021-09-24 (×2): qty 1

## 2021-09-24 MED ORDER — NITROGLYCERIN 0.4 MG SL SUBL: 0.4000 mg | SUBLINGUAL_TABLET | SUBLINGUAL | Status: DC | PRN

## 2021-09-24 MED ORDER — POTASSIUM CHLORIDE CRYS ER 20 MEQ PO TBCR
20.0000 meq | EXTENDED_RELEASE_TABLET | Freq: Two times a day (BID) | ORAL | Status: DC
  Administered 2021-09-24 – 2021-09-25 (×2): 20 meq via ORAL
  Filled 2021-09-24 (×2): qty 1

## 2021-09-24 MED ORDER — POTASSIUM CHLORIDE CRYS ER 20 MEQ PO TBCR
40.0000 meq | EXTENDED_RELEASE_TABLET | Freq: Once | ORAL | Status: AC
  Administered 2021-09-24: 40 meq via ORAL
  Filled 2021-09-24: qty 2

## 2021-09-24 MED ORDER — SODIUM CHLORIDE 0.9% FLUSH
3.0000 mL | Freq: Two times a day (BID) | INTRAVENOUS | Status: DC
  Administered 2021-09-24 – 2021-09-25 (×3): 3 mL via INTRAVENOUS

## 2021-09-24 MED ORDER — ONDANSETRON HCL 4 MG/2ML IJ SOLN: 4.0000 mg | Freq: Four times a day (QID) | INTRAMUSCULAR | Status: DC | PRN

## 2021-09-24 MED ORDER — POTASSIUM CHLORIDE CRYS ER 20 MEQ PO TBCR: 20.0000 meq | EXTENDED_RELEASE_TABLET | Freq: Two times a day (BID) | ORAL | Status: DC

## 2021-09-24 NOTE — Assessment & Plan Note (Signed)
Treatment as outlined in 1 

## 2021-09-24 NOTE — ED Notes (Signed)
Pt educated that purewick was placed and pt educated to urinate and it will be collected in urine canister, pt states she cannot, MD aware, bladder scan to be obtained.

## 2021-09-24 NOTE — Assessment & Plan Note (Addendum)
Patient received Lasix in the ER and was unable to void despite having a purewick catheter in place.  Bladder scan noting greater than 600 cc of urine a Foley catheter placed.  By following day, Foley catheter removed and patient able to void

## 2021-09-24 NOTE — Assessment & Plan Note (Addendum)
Continued on her home medications.  Metoprolol XR changed to twice a day for better rate control as per cardiology.

## 2021-09-24 NOTE — Assessment & Plan Note (Addendum)
Looks to more musculoskeletal as it was reproducible with pain.  Cleared by cardiology.  Shortness of breath secondary to CHF.  Troponins remain normal and EKG unremarkable.

## 2021-09-24 NOTE — Consult Note (Signed)
Cardiology Consultation   Patient ID: [name] MRN: [id number]; DOB: 10-17-1930  Admit date: 09/24/2021 Date of Consult: 09/24/2021  PCP:  [name], [place] Providers Cardiologist:  Audrey Rogue, MD        Patient Profile:   [name] is a 88 y.o. female with a hx of coronary artery disease status post CABG x3, hypertension, hyperlipidemia, anxiety, bilateral carotid artery disease, history of TIA who is being seen 09/24/2021 for the evaluation of chest pain at the request of [name].  History of Present Illness:   [name] is an 88 year old female with a history of coronary disease status post CABG x3 in 12/2016 with postoperative atrial fibrillation, hypertension, hyperlipidemia, gallstones status post ERCP in 02/2018 anxiety, bilateral carotid artery disease, history of TIA who presented to the emergency department for evaluation of chest pain that started 1 day prior to admission.  She had previous PCI and underwent CABG x3 in 12/2016 after cardiac cath showed severe three-vessel disease including 9% distal LMCA stenosis as well as CTO of RCA.  Her CABG occluded LIMA-LAD, SVG-PDA, SVG -OM.  Echocardiogram in 11/2017 revealed LVEF 55-60%, no wall motion abnormalities, G1 DD, moderate MR, mild TR, moderate pulmonary hypertension.  An echo in 11/2020 revealed LVEF 55-60%, no regional wall motion abnormalities, G1 DD, mild MR, and mild to moderate TR.  She was last seen in clinic by [name] 05/12/2021 with occasional shortness of breath with exertion, lower extremity edema, and exertional chest discomfort.  There were no medication changes that were made at that time and previous echocardiogram had been reviewed.  She presented to [facility] emergency department today with chest discomfort, shortness of breath, and overall just not feeling well.  Patient states that over the last day she had had intermittent, dull, pressure-like chest discomfort in the  left upper chest and down the left side.  She had associated lightheadedness and shortness of breath.  It was worse with exertion.  She does state that she had felt generally weak over the last several weeks.  She denies any nausea, vomiting, diaphoresis or palpitations.  She also denied any sick contacts.  Initial vitals: Blood pressure 174/70, pulse 85, respirations 15, temp of 98.1  Pertinent labs: Blood glucose 134, creatinine 1.02, total bilirubin 1.7, GFR 53, BNP 399.2, high-sensitivity troponin of 12 and 17, respiratory panel negative  Imaging: Heart size is mildly enlarged, mediastinal contours within normal limits with moderate calcification within the aortic arch, and median sternotomy and CABG, lungs are clear, mild biapical scarring, no pleural effusion or pneumothorax, mild multilevel degenerative disc changes in the thoracic spine  Medications received in the emergency department: Maalox, potassium chloride 40 mEq, furosemide 40 mg IVP   Past Medical History:  Diagnosis Date   Anxiety    Atypical carcinoid lung tumor (HCC)    CAD (coronary artery disease)    a. 1986: s/p PTCA of bifurcation lesion in LAD;  b. 2001: s/p BMS to LAD & LCX;  c. 04/2006 Cath: diff CAD w/o critical narrowing, nl EF;  d. 12/2014 MV: inflat ST dep, mild basal inf ischemia, EF 55-65%-->Low risk; e. 12/2016 - 3V CABG.   [facility] yrs ago her pt   right lobe of lung. surgery only.    Carotid disease, bilateral ([place])    a. 08/2015 Carotid U/S: < 39% bilat ICA stenosis.   Cataracts, bilateral    Cholangitis    Choledocholithiasis    Cholelithiases  Chronic diarrhea    Depression    Frequent PVCs    Gilbert's syndrome    Headache    PMH: migraines   History of echocardiogram    a. 06/2010 Echo: EF 60-65%, mild AI/MR; b. 10/2015 Echo: EF 60-65%, no rwma, mild MR, nl RV fxn.   HTN (hypertension)    Hyperlipidemia    Impaired fasting blood sugar    Keloid    mid sternum   Macular degeneration     Osteoarthritis    Pneumonia    PONV (postoperative nausea and vomiting)    Positional vertigo    benign   Postoperative atrial fibrillation ([place]) 12/2016   S/P CABG x 3 12/23/2016   LIMA to LAD, SVG to OM, SVG to PDA, EVH via left thigh   TIA (transient ischemic attack)    Wears glasses    Wears hearing aid     Past Surgical History:  Procedure Laterality Date   ABDOMINAL AORTOGRAM N/A 12/22/2016   Procedure: ABDOMINAL AORTOGRAM;  Surgeon: Audrey Bush, MD;  Location: [place] [facility];  Service: Cardiovascular;  Laterality: N/A;   ABDOMINAL HYSTERECTOMY     BILIARY STENT PLACEMENT  12/10/2017   Procedure: BILIARY STENT PLACEMENT;  Surgeon: [name]., MD;  Location: [place];  Service: Gastroenterology;;   CHOLECYSTECTOMY     CORONARY ARTERY BYPASS GRAFT N/A 12/23/2016   Procedure: CORONARY ARTERY BYPASS GRAFTING (CABG) TIMES THREE USING LEFT INTERNAL MAMMARY ARTERY AND LEFT SAPHENOUS LEG VEIN HARVESTED ENDOSCOPICALLY,;  Surgeon: Audrey Alberts, MD;  Location: [place];  Service: Open Heart Surgery;  Laterality: N/A;   ENDOSCOPIC RETROGRADE CHOLANGIOPANCREATOGRAPHY (ERCP) WITH PROPOFOL N/A 02/21/2018   Procedure: ENDOSCOPIC RETROGRADE CHOLANGIOPANCREATOGRAPHY (ERCP) WITH PROPOFOL;  Surgeon: Audrey Landmark Telford Nab., MD;  Location: [place];  Service: Gastroenterology;  Laterality: N/A;   ERCP N/A 12/10/2017   Procedure: ENDOSCOPIC RETROGRADE CHOLANGIOPANCREATOGRAPHY (ERCP);  Surgeon: [name]., MD;  Location: [place];  Service: Gastroenterology;  Laterality: N/A;   IABP INSERTION N/A 12/22/2016   Procedure: IABP Insertion;  Surgeon: Audrey Bush, MD;  Location: [place] [facility];  Service: Cardiovascular;  Laterality: N/A;   LEFT HEART CATH AND CORONARY ANGIOGRAPHY N/A 12/22/2016   Procedure: LEFT HEART CATH AND CORONARY ANGIOGRAPHY;  Surgeon: Audrey Bush, MD;  Location: [place] [facility];  Service: Cardiovascular;  Laterality:  N/A;   LEFT HEART CATHETERIZATION WITH CORONARY ANGIOGRAM N/A 04/27/2012   Procedure: LEFT HEART CATHETERIZATION WITH CORONARY ANGIOGRAM;  Surgeon: Audrey M Martinique, MD;  [facility] C[facility];  Service: Cardiovascular;  Laterality: N/A;   LOBECTOMY Right    RLL atypical carcinoid tumor - Dr Eugenia Mcalpine  1986   PCI of LAD  2001   with non des lad    pci of mid cfx     with non des and cutting balloon pci of av circumflex   REMOVAL OF STONES  02/21/2018   Procedure: REMOVAL OF STONES;  Surgeon: [name]., MD;  Location: [place];  Service: Gastroenterology;;   Joan Mayans  02/21/2018   Procedure: Joan Mayans;  Surgeon: [name]., MD;  Location: [place];  Service: Gastroenterology;;   Lavell Islam REMOVAL  02/21/2018   Procedure: STENT REMOVAL;  Surgeon: [name]., MD;  Location: [place];  Service: Gastroenterology;;   TEE WITHOUT CARDIOVERSION N/A 12/23/2016   Procedure: TRANSESOPHAGEAL ECHOCARDIOGRAM (TEE);  Surgeon: Audrey Alberts, MD;  Location: [place];  Service: Open Heart Surgery;  Laterality: N/A;  Home Medications:  Prior to Admission medications   Medication Sig Start Date End Date Taking? Authorizing Provider  amLODipine (NORVASC) 5 MG tablet Take 1 tablet (5 mg total) by mouth 2 (two) times daily. 1 tablet in the morning and half tablet at night. 10/08/20  Yes Gollan, Kathlene November, MD  aspirin 81 MG tablet Take 81 mg by mouth daily.   Yes [provider]  atorvastatin (LIPITOR) 40 MG tablet Take 1 tablet (40 mg total) by mouth daily. 10/08/20  Yes Minna Merritts, MD  calcium carbonate (TUMS EX) 750 MG chewable tablet Chew 2 tablets by mouth as needed for heartburn.   Yes [provider]  Calcium Carbonate-Vitamin D (CALCIUM 600 + D PO) Take 1 tablet by mouth daily. 600 mg/ 400 mg   Yes [provider]  estradiol (ESTRACE) 0.5 MG tablet Take 0.5 mg by mouth daily. 10/04/19  Yes [provider]   famotidine (PEPCID) 20 MG tablet Take 20 mg by mouth 2 (two) times daily.   Yes [provider]  furosemide (LASIX) 40 MG tablet Take 1 tablet (40 mg total) by mouth daily. 10/08/20  Yes Gollan, Kathlene November, MD  LORazepam (ATIVAN) 0.5 MG tablet Take 1 tablet (0.5 mg total) by mouth every 8 (eight) hours as needed for anxiety. 01/02/17  Yes Barrett, Erin R, PA-C  metoprolol succinate (TOPROL-XL) 50 MG 24 hr tablet Take 1 tablet (50 mg total) by mouth daily. Take with or immediately following a meal. 10/08/20  Yes Gollan, Kathlene November, MD  Multiple Vitamin (MULTIVITAMIN) tablet Take 1 tablet by mouth daily.   Yes [provider]  nitroGLYCERIN (NITROSTAT) 0.4 MG SL tablet Place 1 tablet (0.4 mg total) under the tongue every 5 (five) minutes as needed for chest pain. 10/08/20  Yes Gollan, Kathlene November, MD  potassium chloride SA (KLOR-CON M20) 20 MEQ tablet Take 1 tablet (20 mEq total) by mouth 2 (two) times daily. 10/08/20  Yes Minna Merritts, MD  telmisartan (MICARDIS) 80 MG tablet Take 1 tablet (80 mg total) by mouth daily. 10/08/20  Yes Minna Merritts, MD  acetaminophen (TYLENOL) 325 MG tablet Take 2 tablets (650 mg total) by mouth every 6 (six) hours as needed for mild pain or headache. 01/02/17   Barrett, Lodema Hong, PA-C  meloxicam (MOBIC) 7.5 MG tablet Take one tablet daily for five days. Then take as needed for pain. Further refills with primary care provider. Patient not taking: Reported on 09/24/2021 01/03/20   Loel Dubonnet, NP  pantoprazole (PROTONIX) 40 MG tablet Take 40 mg by mouth daily. Patient not taking: Reported on 09/24/2021 12/20/17   [provider]    Inpatient Medications: Scheduled Meds:  alum & mag hydroxide-simeth  30 mL Oral Once   amLODipine  2.5 mg Oral QHS   amLODipine  5 mg Oral Daily   aspirin EC  81 mg Oral Daily   atorvastatin  40 mg Oral QPM   [START ON 09/25/2021] calcium-vitamin D  1 tablet Oral Daily   enoxaparin (LOVENOX) injection  0.5  mg/kg Subcutaneous Q24H   [START ON 09/25/2021] estradiol  0.5 mg Oral Daily   [START ON 09/25/2021] furosemide  40 mg Oral Daily   irbesartan  300 mg Oral Daily   metoprolol succinate  50 mg Oral Daily   [START ON 09/25/2021] multivitamin with minerals  1 tablet Oral Daily   pantoprazole  40 mg Oral Daily   potassium chloride SA  20 mEq Oral BID  sodium chloride flush  3 mL Intravenous Q12H   Continuous Infusions:  sodium chloride     PRN Meds: sodium chloride, acetaminophen, calcium carbonate, LORazepam, nitroGLYCERIN, ondansetron **OR** ondansetron (ZOFRAN) IV, sodium chloride flush  Allergies:    Allergies  Allergen Reactions   Sertraline Hcl Nausea Only   Biaxin [Clarithromycin]     depressed   Celecoxib Other (See Comments)    Reaction: Unknown   Ciprofloxacin    Codeine Other (See Comments)    Reaction: Unknown   Ferrous Sulfate Other (See Comments)   Hydromorphone    Lisinopril     cough   Macrobid [Nitrofurantoin Monohyd Macro] Other (See Comments)    Reaction: Unknown   Oxycodone Hcl Other (See Comments)    Reaction: Unknown   Prednisone    Sertraline Hcl Other (See Comments)    Reaction: Unknown   Penicillins Rash    Reaction: Unknown    Social History:   Social History   Socioeconomic History   Marital status: Divorced    Spouse name: Not on file   Number of children: Not on file   Years of education: Not on file   Highest education level: Not on file  Occupational History   Occupation: retired  Tobacco Use   Smoking status: Never   Smokeless tobacco: Never  Vaping Use   Vaping Use: Never used  Substance and Sexual Activity   Alcohol use: No   Drug use: No   Sexual activity: Not on file  Other Topics Concern   Not on file  Social History Narrative   Not on file   Social Determinants of Health   Financial Resource Strain: Not on file  Food Insecurity: Not on file  Transportation Needs: Not on file  Physical Activity: Unknown (02/03/2018)    Exercise Vital Sign    Days of Exercise per Week: 0 days    Minutes of Exercise per Session: Not on file  Stress: No Stress Concern Present (02/03/2018)   [place]    Feeling of Stress : Not at all  Social Connections: Unknown (02/03/2018)   Social Connection and Isolation Panel [NHANES]    Frequency of Communication with Friends and Family: Patient refused    Frequency of Social Gatherings with Friends and Family: Patient refused    Attends Religious Services: Patient refused    Active Member of Clubs or Organizations: Patient refused    Attends Archivist Meetings: Patient refused    Marital Status: Patient refused  Intimate Partner Violence: Unknown (02/03/2018)   Humiliation, Afraid, Rape, and Kick questionnaire    Fear of Current or Ex-Partner: Patient refused    Emotionally Abused: Patient refused    Physically Abused: Patient refused    Sexually Abused: Patient refused    Family History:    Family History  Problem Relation Age of Onset   Diabetes Mellitus I Mother    CAD Mother    CAD Father    Diabetes Mellitus I Sister    Diabetes Mellitus I Brother    Diabetes Mellitus I Sister    Colon cancer Neg Hx    Esophageal cancer Neg Hx    Inflammatory bowel disease Neg Hx    Liver disease Neg Hx    Pancreatic cancer Neg Hx    Rectal cancer Neg Hx    Stomach cancer Neg Hx      ROS:  Please see the history of present illness.  Review of  Systems  Constitutional:  Positive for malaise/fatigue.  Respiratory:  Positive for shortness of breath.   Cardiovascular:  Positive for chest pain and leg swelling.  Neurological:  Positive for weakness.    All other ROS reviewed and negative.     Physical Exam/Data:   Vitals:   09/24/21 0739 09/24/21 1000 09/24/21 1030 09/24/21 1130  BP: (!) 174/70 (!) 168/64 (!) 160/63 137/70  Pulse: (!) 43 80 85 83  Resp: '15  17 13  '$ Temp:    98.1 F (36.7 C)   TempSrc:    Oral  SpO2: 98% 98% 98% 97%  Weight:      Height:       No intake or output data in the 24 hours ending 09/24/21 1327    09/24/2021    7:38 AM 05/12/2021    2:35 PM 10/08/2020    4:21 PM  Last 3 Weights  Weight (lbs) 200 lb 201 lb 4 oz 199 lb 6 oz  Weight (kg) 90.719 kg 91.286 kg 90.436 kg     Body mass index is 31.32 kg/m.  General:  Well nourished, well developed, in no acute distress, chronically ill appearing HEENT: normal Neck: no JVD Vascular: No carotid bruits; Distal pulses 2+ bilaterally Cardiac:  normal S1, S2; RRR; II/VI systolic murmur  Lungs:  clear to auscultation bilaterally, no wheezing, rhonchi or rales  Abd: soft, nontender, no hepatomegaly  Ext: trace pertibial edema Musculoskeletal:  No deformities, BUE and BLE strength normal and equal Skin: warm and dry  Neuro:  CNs 2-12 intact, no focal abnormalities noted Psych:  Normal affect   EKG:  The EKG was personally reviewed and demonstrates:  sinus rate of 87, first degree AVB, unifocal PVC's in bigeminy pattern Telemetry:  Telemetry was personally reviewed and demonstrates:  sinus rates of 70-80 with multiple unifocal PVC's, occasional bigeminy pattern  Relevant CV Studies: TTE 11/14/20 1. Challenging images   2. Left ventricular ejection fraction, by estimation, is 55 to 60%. The  left ventricle has normal function. The left ventricle has no regional  wall motion abnormalities. Left ventricular diastolic parameters are  consistent with Grade I diastolic  dysfunction (impaired relaxation).   3. Right ventricular systolic function is normal. The right ventricular  size is normal. There is normal pulmonary artery systolic pressure. The  estimated right ventricular systolic pressure is 19.4 mmHg.   4. The mitral valve is normal in structure. Mild mitral valve  regurgitation. No evidence of mitral stenosis.   5. Tricuspid valve regurgitation is mild to moderate.   6. The aortic valve was not well  visualized. There is moderate  calcification of the aortic valve. Aortic valve regurgitation is not  visualized. Mild to moderate aortic valve sclerosis/calcification is  present, without any evidence of aortic stenosis.   7. The inferior vena cava is normal in size with greater than 50%  respiratory variability, suggesting right atrial pressure of 3 mmHg.   Laboratory Data:  High Sensitivity Troponin:   Recent Labs  Lab 09/24/21 0741 09/24/21 0942  TROPONINIHS 12 17     Chemistry Recent Labs  Lab 09/24/21 0741  NA 137  K 3.7  CL 104  CO2 24  GLUCOSE 134*  BUN 15  CREATININE 1.02*  CALCIUM 9.1  MG 1.9  GFRNONAA 53*  ANIONGAP 9    Recent Labs  Lab 09/24/21 0741  PROT 6.5  ALBUMIN 3.8  AST 28  ALT 23  ALKPHOS 74  BILITOT 1.7*   Lipids No  results for input(s): "CHOL", "TRIG", "HDL", "LABVLDL", "LDLCALC", "CHOLHDL" in the last 168 hours.  Hematology Recent Labs  Lab 09/24/21 0741  WBC 8.1  RBC 4.30  HGB 13.1  HCT 39.1  MCV 90.9  MCH 30.5  MCHC 33.5  RDW 12.4  PLT 153   Thyroid No results for input(s): "TSH", "FREET4" in the last 168 hours.  BNP Recent Labs  Lab 09/24/21 0741  BNP 399.2*    DDimer No results for input(s): "DDIMER" in the last 168 hours.   Radiology/Studies:  DG Chest 2 View  Result Date: 09/24/2021 CLINICAL DATA:  Chest pain radiating to jaw. History of triple bypass. EXAM: CHEST - 2 VIEW COMPARISON:  Chest two views 12/04/2019 FINDINGS: Heart size is again mildly enlarged. Mediastinal contours are within normal limits with moderate calcification within the aortic arch. Status post median sternotomy and CABG. The lungs are clear. Mild biapical scarring is similar to prior. No pleural effusion or pneumothorax. Mild multilevel degenerative disc changes of the thoracic spine. IMPRESSION: No acute cardiopulmonary disease process. Electronically Signed   By: Yvonne Kendall M.D.   On: 09/24/2021 08:13     Assessment and Plan:   Chest  pain/ history of coronary artery disease s/p PCI/DES and CABG -atypical presentation, states it is worse with inspiration -currently chest pain free -Hs troponins negative x 2 -with chest discomfort on exertion and associated shortness of breath with exertion and more sedentary lifestyle as of late would check d-dimer -EKG with high ectopy burden -continue cardiac monitor -start imdur, up titrate as needed -EKG PRN pain and changes -potassium 3.7, recommend keeping closer to 4  -daily bmp -no invasive procedures planned, patient declines invasive procedures  Shortness of breath -unlabored at rest on room air -BNP 399.2 -given 40 mg furosemide IVP in the emergency department -no anemia noted with hgb 13.1 -echocardiogram ordered and pending -d-dimer ordered  Essential hypertension -blood pressure 137/70 -continue PTA medications irbesartan, toprol XL, and furosemide -stop amlodipine and start imdur -vital signs per unit protocol  4.   Bigeminy on EKG -mg 1.9 -potassium 3.7, keep closer to 4 -up titrate Toprol XL if blood pressure tolerates nitrates -daily bmp    Risk Assessment/Risk Scores:  {  For questions or updates, please contact [place] Please consult [email or web address] for contact info under    Signed, Avagail Whittlesey, NP  09/24/2021 1:27 PM

## 2021-09-24 NOTE — ED Notes (Signed)
Informed RN bed assigned 

## 2021-09-24 NOTE — ED Notes (Signed)
Purewick placed on patient. Pt educated on use. Pt given call bell. Pt denies any other needs at this time

## 2021-09-24 NOTE — ED Notes (Signed)
Bladder scan result showed 680 ml, MD aware.

## 2021-09-24 NOTE — H&P (Signed)
History and Physical    Patient: [name] DOB: April 30, 1930 DOA: 09/24/2021 DOS: the patient was seen and examined on 09/24/2021 PCP: Lajean Manes, MD  Patient coming from: Home  Chief Complaint:  Chief Complaint  Patient presents with   Chest Pain   HPI: [name] is a 88 y.o. female with medical history significant for coronary artery disease status post CABG x3, history of hypertension, anxiety disorder, bilateral carotid artery disease, history of TIA who presents to the ER for evaluation of chest pain that started 1 day prior to admission and was persistent.  It actually woke her up at about 3 AM with radiation to her jaw.  She described it as pressure-like and worse with exertion with associated shortness of breath.  She denies having any nausea, no vomiting, no diaphoresis or palpitations. Patient complains of difficulty getting to her mailbox which she usually is able to do due to shortness of breath. She denies having any abdominal pain, no fever, no chills, no cough, no headache, no urinary symptoms, no changes in her bowel habits, no blurred vision or focal deficit. She received a dose of Lasix 40 mg IV in the ER and will be referred to observation status for further evaluation.   Review of Systems: As mentioned in the history of present illness. All other systems reviewed and are negative. Past Medical History:  Diagnosis Date   Anxiety    Atypical carcinoid lung tumor ([place])    CAD (coronary artery disease)    a. 1986: s/p PTCA of bifurcation lesion in LAD;  b. 2001: s/p BMS to LAD & LCX;  c. 04/2006 Cath: diff CAD w/o critical narrowing, nl EF;  d. 12/2014 MV: inflat ST dep, mild basal inf ischemia, EF 55-65%-->Low risk; e. 12/2016 - 3V CABG.   Cancer [facility]) yrs ago her pt   right lobe of lung. surgery only.    Carotid disease, bilateral ([place])    a. 08/2015 Carotid U/S: < 39% bilat ICA stenosis.   Cataracts, bilateral    Cholangitis     Choledocholithiasis    Cholelithiases    Chronic diarrhea    Depression    Frequent PVCs    Gilbert's syndrome    Headache    PMH: migraines   History of echocardiogram    a. 06/2010 Echo: EF 60-65%, mild AI/MR; b. 10/2015 Echo: EF 60-65%, no rwma, mild MR, nl RV fxn.   HTN (hypertension)    Hyperlipidemia    Impaired fasting blood sugar    Keloid    mid sternum   Macular degeneration    Osteoarthritis    Pneumonia    PONV (postoperative nausea and vomiting)    Positional vertigo    benign   Postoperative atrial fibrillation ([place]) 12/2016   S/P CABG x 3 12/23/2016   LIMA to LAD, SVG to OM, SVG to PDA, EVH via left thigh   TIA (transient ischemic attack)    Wears glasses    Wears hearing aid    Past Surgical History:  Procedure Laterality Date   ABDOMINAL AORTOGRAM N/A 12/22/2016   Procedure: ABDOMINAL AORTOGRAM;  Surgeon: Nelva Bush, MD;  Location: [place] [facility];  Service: Cardiovascular;  Laterality: N/A;   ABDOMINAL HYSTERECTOMY     BILIARY STENT PLACEMENT  12/10/2017   Procedure: BILIARY STENT PLACEMENT;  Surgeon: Rush Landmark Telford Nab., MD;  Location: [place];  Service: Gastroenterology;;   CHOLECYSTECTOMY     CORONARY ARTERY BYPASS GRAFT N/A 12/23/2016  Procedure: CORONARY ARTERY BYPASS GRAFTING (CABG) TIMES THREE USING LEFT INTERNAL MAMMARY ARTERY AND LEFT SAPHENOUS LEG VEIN HARVESTED ENDOSCOPICALLY,;  Surgeon: Rexene Alberts, MD;  Location: [place];  Service: Open Heart Surgery;  Laterality: N/A;   ENDOSCOPIC RETROGRADE CHOLANGIOPANCREATOGRAPHY (ERCP) WITH PROPOFOL N/A 02/21/2018   Procedure: ENDOSCOPIC RETROGRADE CHOLANGIOPANCREATOGRAPHY (ERCP) WITH PROPOFOL;  Surgeon: Rush Landmark Telford Nab., MD;  Location: [place];  Service: Gastroenterology;  Laterality: N/A;   ERCP N/A 12/10/2017   Procedure: ENDOSCOPIC RETROGRADE CHOLANGIOPANCREATOGRAPHY (ERCP);  Surgeon: Irving Copas., MD;  Location: [place];  Service: Gastroenterology;   Laterality: N/A;   IABP INSERTION N/A 12/22/2016   Procedure: IABP Insertion;  Surgeon: Nelva Bush, MD;  Location: [place] [facility];  Service: Cardiovascular;  Laterality: N/A;   LEFT HEART CATH AND CORONARY ANGIOGRAPHY N/A 12/22/2016   Procedure: LEFT HEART CATH AND CORONARY ANGIOGRAPHY;  Surgeon: Nelva Bush, MD;  Location: [place] [facility];  Service: Cardiovascular;  Laterality: N/A;   LEFT HEART CATHETERIZATION WITH CORONARY ANGIOGRAM N/A 04/27/2012   Procedure: LEFT HEART CATHETERIZATION WITH CORONARY ANGIOGRAM;  Surgeon: Peter M Martinique, MD;  Location: [facility] CATH [facility];  Service: Cardiovascular;  Laterality: N/A;   LOBECTOMY Right    RLL atypical carcinoid tumor - Dr Eugenia Mcalpine  1986   PCI of LAD  2001   with non des lad    pci of mid cfx     with non des and cutting balloon pci of av circumflex   REMOVAL OF STONES  02/21/2018   Procedure: REMOVAL OF STONES;  Surgeon: Irving Copas., MD;  Location: [place];  Service: Gastroenterology;;   Joan Mayans  02/21/2018   Procedure: Joan Mayans;  Surgeon: Irving Copas., MD;  Location: [place];  Service: Gastroenterology;;   Lavell Islam REMOVAL  02/21/2018   Procedure: STENT REMOVAL;  Surgeon: Irving Copas., MD;  Location: [place];  Service: Gastroenterology;;   TEE WITHOUT CARDIOVERSION N/A 12/23/2016   Procedure: TRANSESOPHAGEAL ECHOCARDIOGRAM (TEE);  Surgeon: Rexene Alberts, MD;  Location: [place];  Service: Open Heart Surgery;  Laterality: N/A;   Social History:  reports that she has never smoked. She has never used smokeless tobacco. She reports that she does not drink alcohol and does not use drugs.  Allergies  Allergen Reactions   Sertraline Hcl Nausea Only   Biaxin [Clarithromycin]     depressed   Celecoxib Other (See Comments)    Reaction: Unknown   Ciprofloxacin    Codeine Other (See Comments)    Reaction: Unknown   Ferrous Sulfate Other (See Comments)   Hydromorphone     Lisinopril     cough   Macrobid [Nitrofurantoin Monohyd Macro] Other (See Comments)    Reaction: Unknown   Oxycodone Hcl Other (See Comments)    Reaction: Unknown   Prednisone    Sertraline Hcl Other (See Comments)    Reaction: Unknown   Penicillins Rash    Reaction: Unknown    Family History  Problem Relation Age of Onset   Diabetes Mellitus I Mother    CAD Mother    CAD Father    Diabetes Mellitus I Sister    Diabetes Mellitus I Brother    Diabetes Mellitus I Sister    Colon cancer Neg Hx    Esophageal cancer Neg Hx    Inflammatory bowel disease Neg Hx    Liver disease Neg Hx    Pancreatic cancer Neg Hx    Rectal cancer Neg Hx    Stomach  cancer Neg Hx     Prior to Admission medications   Medication Sig Start Date End Date Taking? Authorizing Provider  acetaminophen (TYLENOL) 325 MG tablet Take 2 tablets (650 mg total) by mouth every 6 (six) hours as needed for mild pain or headache. 01/02/17   Barrett, Erin R, PA-C  amLODipine (NORVASC) 5 MG tablet Take 1 tablet (5 mg total) by mouth 2 (two) times daily. 1 tablet in the morning and half tablet at night. 10/08/20   Minna Merritts, MD  aspirin 81 MG tablet Take 81 mg by mouth daily.    [provider]  atorvastatin (LIPITOR) 40 MG tablet Take 1 tablet (40 mg total) by mouth daily. 10/08/20   Minna Merritts, MD  calcium carbonate (TUMS EX) 750 MG chewable tablet Chew 2 tablets by mouth as needed for heartburn.    [provider]  Calcium Carbonate-Vitamin D (CALCIUM 600 + D PO) Take 1 tablet by mouth daily. 600 mg/ 400 mg    [provider]  estradiol (ESTRACE) 0.5 MG tablet Take 0.5 mg by mouth daily. 10/04/19   [provider]  famotidine (PEPCID) 20 MG tablet Take 20 mg by mouth 2 (two) times daily.    [provider]  furosemide (LASIX) 40 MG tablet Take 1 tablet (40 mg total) by mouth daily. 10/08/20   Minna Merritts, MD  LORazepam (ATIVAN) 0.5 MG tablet Take 1 tablet  (0.5 mg total) by mouth every 8 (eight) hours as needed for anxiety. 01/02/17   Barrett, Erin R, PA-C  meloxicam (MOBIC) 7.5 MG tablet Take one tablet daily for five days. Then take as needed for pain. Further refills with primary care provider. 01/03/20   Loel Dubonnet, NP  metoprolol succinate (TOPROL-XL) 50 MG 24 hr tablet Take 1 tablet (50 mg total) by mouth daily. Take with or immediately following a meal. 10/08/20   Gollan, Kathlene November, MD  Multiple Vitamin (MULTIVITAMIN) tablet Take 1 tablet by mouth daily.    [provider]  nitroGLYCERIN (NITROSTAT) 0.4 MG SL tablet Place 1 tablet (0.4 mg total) under the tongue every 5 (five) minutes as needed for chest pain. 10/08/20   Minna Merritts, MD  pantoprazole (PROTONIX) 40 MG tablet Take 40 mg by mouth daily. 12/20/17   [provider]  potassium chloride SA (KLOR-CON M20) 20 MEQ tablet Take 1 tablet (20 mEq total) by mouth 2 (two) times daily. 10/08/20   Minna Merritts, MD  telmisartan (MICARDIS) 80 MG tablet Take 1 tablet (80 mg total) by mouth daily. 10/08/20   Minna Merritts, MD    Physical Exam:  Physical Exam Vitals and nursing note reviewed.  Constitutional:      Appearance: She is well-developed.     Comments: Chronically ill-appearing  HENT:     Head: Normocephalic and atraumatic.  Cardiovascular:     Rate and Rhythm: Normal rate and regular rhythm.     Heart sounds: Normal heart sounds.  Pulmonary:     Effort: Pulmonary effort is normal.  Abdominal:     General: Bowel sounds are normal.     Palpations: Abdomen is soft.  Musculoskeletal:        General: Normal range of motion.     Cervical back: Normal range of motion and neck supple.  Skin:    General: Skin is warm and dry.  Neurological:     General: No focal deficit present.     Mental Status: She is  alert.  Psychiatric:        Mood and Affect: Mood is anxious.      Data Reviewed: Relevant notes from primary care and specialist visits,  past discharge summaries as available in EHR, including Care Everywhere. Prior diagnostic testing as pertinent to current admission diagnoses Updated medications and problem lists for reconciliation ED course, including vitals, labs, imaging, treatment and response to treatment Triage notes, nursing and pharmacy notes and ED provider's notes Notable results as noted in HPI Labs reviewed.  Troponin 17, magnesium 1.9, BNP 399, sodium 137, potassium 3.7, chloride 104, bicarb 24, glucose 134, BUN 15, creatinine 1.02, calcium 9.1, total protein 6.5, albumin 3.8, AST 28, ALT 23, alkaline phosphatase 74, total bilirubin 1.7, white count 8.1, hemoglobin 13.1, hematocrit 39, platelet count 153 Chest x-ray reviewed by me shows no evidence of acute cardiopulmonary disease Twelve-lead EKG reviewed by me shows sinus rhythm, ventricular bigeminy, prolonged PR interval There are no new results to review at this time.  Assessment and Plan: * Chest pain Patient with a history of coronary artery disease status post CABG x3 who presents for evaluation of chest pain/pressure with radiation to her jaw associated with shortness of breath and lightheadedness. She has ruled out for an acute coronary syndrome Continue aspirin, atorvastatin and metoprolol Continue as needed nitrates Consult cardiology  S/P CABG x 3 Treatment as outlined in 1  Anxiety Continue as needed lorazepam  Acute urinary retention Patient received Lasix in the ER and was unable to void despite having a pure wick in place Bladder scan showed greater than 600 mL of urine Foley catheter placed Voiding trial prior to discharge and outpatient urology follow-up  Essential hypertension Continue Avapro amlodipine and metoprolol for blood pressure control      Advance Care Planning:   Code Status: Full Code   Consults: Cardiology  Family Communication: Greater than 50% of time was spent discussing patient's condition and plan of care  with her at the bedside.  All questions and concerns have been addressed.  She verbalizes understanding and agrees to the plan.  Severity of Illness: The appropriate patient status for this patient is OBSERVATION. Observation status is judged to be reasonable and necessary in order to provide the required intensity of service to ensure the patient's safety. The patient's presenting symptoms, physical exam findings, and initial radiographic and laboratory data in the context of their medical condition is felt to place them at decreased risk for further clinical deterioration. Furthermore, it is anticipated that the patient will be medically stable for discharge from the hospital within 2 midnights of admission.   Author: Collier Bullock, MD 09/24/2021 12:50 PM  For on call review [email or web address].

## 2021-09-24 NOTE — Assessment & Plan Note (Signed)
Continue as needed lorazepam 

## 2021-09-24 NOTE — ED Notes (Signed)
Pt ambulated to restroom, pt maintained pulse ox above 93% with no increase in RR.

## 2021-09-24 NOTE — ED Provider Notes (Signed)
[facility] Provider Note    Event Date/Time   First MD Initiated Contact with Patient 09/24/21 [phone]     (approximate)   History   Chest Pain   HPI  [name] is a 88 y.o. female here with chest discomfort.  The patient states that over the last day, she has had intermittent, dull, pressure-like, chest pressure.  She had associated lightheadedness and shortness of breath.  This is worse with exertion.  She has history of coronary disease as well as bypass, but denies any significant pain during those episodes other than some mild back discomfort.  She states that she has felt generally weak over the last several weeks, however, and has noticed worsening shortness of breath with exertion.  She feels like she is having difficulty getting out to her mailbox whereas normally she was able to do this.  Denies any ongoing pain.     Physical Exam   Triage Vital Signs: ED Triage Vitals  Enc Vitals Group     BP 09/24/21 0739 (!) 174/70     Pulse Rate 09/24/21 0736 85     Resp 09/24/21 0736 15     Temp 09/24/21 0736 98.1 F (36.7 C)     Temp Source 09/24/21 0736 Oral     SpO2 09/24/21 0736 99 %     Weight 09/24/21 0738 200 lb (90.7 kg)     Height 09/24/21 0738 '5\' 7"'$  (1.702 m)     Head Circumference --      Peak Flow --      Pain Score 09/24/21 0737 0     Pain Loc --      Pain Edu? --      Excl. in [place]? --     Most recent vital signs: Vitals:   09/24/21 1000 09/24/21 1030  BP: (!) 168/64 (!) 160/63  Pulse: 80 85  Resp:  17  Temp:    SpO2: 98% 98%     General: Awake, no distress.  CV:  Good peripheral perfusion.  3 out of 6 systolic murmur appreciated.  Frequent irregular PVCs.Marland Kitchen Resp:  Normal effort.  Abd:  No distention.  Other:  Trace edema bilaterally.   ED Results / Procedures / Treatments   Labs (all labs ordered are listed, but only abnormal results are displayed) Labs Reviewed  COMPREHENSIVE METABOLIC PANEL - Abnormal; Notable for  the following components:      Result Value   Glucose, Bld 134 (*)    Creatinine, Ser 1.02 (*)    Total Bilirubin 1.7 (*)    GFR, Estimated 53 (*)    All other components within normal limits  BRAIN NATRIURETIC PEPTIDE - Abnormal; Notable for the following components:   B Natriuretic Peptide 399.2 (*)    All other components within normal limits  SARS CORONAVIRUS 2 BY RT PCR  CBC WITH DIFFERENTIAL/PLATELET  MAGNESIUM  TROPONIN I (HIGH SENSITIVITY)  TROPONIN I (HIGH SENSITIVITY)     EKG Normal sinus rhythm, ventricular rate 87.  PR 224, QRS 85, QTc 439.  Very frequent PVCs with intermittent bigeminy.  No acute ST elevations.   RADIOLOGY Chest x-ray: No active cardiopulmonary disease   I also independently reviewed and agree with radiologist interpretations.   PROCEDURES:  Critical Care performed: No  .1-3 Lead EKG Interpretation  Performed by: Duffy Bruce, MD Authorized by: Duffy Bruce, MD     Interpretation: normal     ECG rate:  60-80s   ECG rate  assessment: normal     Rhythm: sinus rhythm     Ectopy: none     Conduction: normal   Comments:     Indication: Chest pain     MEDICATIONS ORDERED IN ED: Medications  alum & mag hydroxide-simeth (MAALOX/MYLANTA) 200-200-20 MG/5ML suspension 30 mL (0 mLs Oral Hold 09/24/21 1021)  potassium chloride SA (KLOR-CON M) CR tablet 40 mEq (40 mEq Oral Given 09/24/21 1035)  furosemide (LASIX) injection 40 mg (40 mg Intravenous Given 09/24/21 1035)     IMPRESSION / MDM / ASSESSMENT AND PLAN / ED COURSE  I reviewed the triage vital signs and the nursing notes.                               The patient is on the cardiac monitor to evaluate for evidence of arrhythmia and/or significant heart rate changes.   Ddx:  Differential includes the following, with pertinent life- or limb-threatening emergencies considered:  ACS, angina, CHF, arrhythmia, deconditioning, anemia, anxiety  Patient's presentation is most  consistent with acute presentation with potential threat to life or bodily function.  MDM:  88 year old female with history of CAD, CABG, tricuspid regurgitation, here with shortness of breath with exertion.  This has worsened in the last several weeks and now she has difficulty even getting across her house.  Clinically, the patient has some mild lower extremity edema, frequent PVCs, but is otherwise nontoxic.  Significant systolic murmur appreciated.  Lab work is overall reassuring.  CBC shows no leukocytosis or anemia.  CMP with normal electrolytes.  Troponin 12 and 17, slightly increased but pain resolved currently.  BNP is elevated at 400.  Chest x-ray is clear.  Patient with significant dyspnea and increased ectopy with even slight ambulation in the room.  Will admit for diuresis as well as possible repeat echocardiogram and discussion with her cardiologist, Dr. Rockey Situ.  Per review of his last note, would recommend increasing metoprolol.   MEDICATIONS GIVEN IN ED: Medications  alum & mag hydroxide-simeth (MAALOX/MYLANTA) 200-200-20 MG/5ML suspension 30 mL (0 mLs Oral Hold 09/24/21 1021)  potassium chloride SA (KLOR-CON M) CR tablet 40 mEq (40 mEq Oral Given 09/24/21 1035)  furosemide (LASIX) injection 40 mg (40 mg Intravenous Given 09/24/21 1035)     Consults:  Hospitalist   EMR reviewed  Prior echocardiogram, cardiology notes from Dr. Rockey Situ 05/2021     FINAL CLINICAL IMPRESSION(S) / ED DIAGNOSES   Final diagnoses:  Shortness of breath  Nonrheumatic tricuspid valve regurgitation  Bigeminy     Rx / DC Orders   ED Discharge Orders     None        Note:  This document was prepared using Dragon voice recognition software and may include unintentional dictation errors.   Duffy Bruce, MD 09/24/21 1125

## 2021-09-24 NOTE — ED Triage Notes (Signed)
Pt presents to ED with c/o of having CP that radiated to her jaw that woke her up from her sleep at 0300. Pt states HX of triple bypass.  Pt given 324 of ASA by EMS as well as 1 nitro spray. Pt denies pain at this time. Pt denies SOB at this time.   Pt lives at home with her son.

## 2021-09-24 NOTE — ED Notes (Signed)
Pt received IV lasix and and this RN placed bedside commode next to pt bed, pt was able to get up and use bedside commode but states "this is too much". Purewick offered.

## 2021-09-25 DIAGNOSIS — I5033 Acute on chronic diastolic (congestive) heart failure: Principal | ICD-10-CM

## 2021-09-25 DIAGNOSIS — R0789 Other chest pain: Secondary | ICD-10-CM | POA: Diagnosis not present

## 2021-09-25 DIAGNOSIS — I10 Essential (primary) hypertension: Secondary | ICD-10-CM | POA: Diagnosis not present

## 2021-09-25 DIAGNOSIS — R0602 Shortness of breath: Secondary | ICD-10-CM | POA: Diagnosis not present

## 2021-09-25 DIAGNOSIS — E669 Obesity, unspecified: Secondary | ICD-10-CM | POA: Diagnosis present

## 2021-09-25 DIAGNOSIS — I25118 Atherosclerotic heart disease of native coronary artery with other forms of angina pectoris: Secondary | ICD-10-CM

## 2021-09-25 DIAGNOSIS — I498 Other specified cardiac arrhythmias: Secondary | ICD-10-CM | POA: Diagnosis not present

## 2021-09-25 DIAGNOSIS — R338 Other retention of urine: Secondary | ICD-10-CM

## 2021-09-25 DIAGNOSIS — Z951 Presence of aortocoronary bypass graft: Secondary | ICD-10-CM | POA: Diagnosis not present

## 2021-09-25 DIAGNOSIS — R072 Precordial pain: Secondary | ICD-10-CM | POA: Diagnosis not present

## 2021-09-25 LAB — CBC
HCT: 35.6 % — ABNORMAL LOW (ref 36.0–46.0)
Hemoglobin: 12.1 g/dL (ref 12.0–15.0)
MCH: 30.4 pg (ref 26.0–34.0)
MCHC: 34 g/dL (ref 30.0–36.0)
MCV: 89.4 fL (ref 80.0–100.0)
Platelets: 152 10*3/uL (ref 150–400)
RBC: 3.98 MIL/uL (ref 3.87–5.11)
RDW: 12.5 % (ref 11.5–15.5)
WBC: 6.4 10*3/uL (ref 4.0–10.5)
nRBC: 0 % (ref 0.0–0.2)

## 2021-09-25 LAB — ECHOCARDIOGRAM COMPLETE
AV Mean grad: 7.5 mmHg
AV Peak grad: 13.5 mmHg
Ao pk vel: 1.84 m/s
Area-P 1/2: 2.5 cm2
Height: 67 in
S' Lateral: 2.3 cm
Weight: 3200 oz

## 2021-09-25 LAB — BASIC METABOLIC PANEL
Anion gap: 9 (ref 5–15)
BUN: 18 mg/dL (ref 8–23)
CO2: 25 mmol/L (ref 22–32)
Calcium: 8.7 mg/dL — ABNORMAL LOW (ref 8.9–10.3)
Chloride: 106 mmol/L (ref 98–111)
Creatinine, Ser: 1.08 mg/dL — ABNORMAL HIGH (ref 0.44–1.00)
GFR, Estimated: 49 mL/min — ABNORMAL LOW (ref >60)
Glucose, Bld: 107 mg/dL — ABNORMAL HIGH (ref 70–99)
Potassium: 3.4 mmol/L — ABNORMAL LOW (ref 3.5–5.1)
Sodium: 140 mmol/L (ref 135–145)

## 2021-09-25 MED ORDER — METOPROLOL TARTRATE 50 MG PO TABS: 50.0000 mg | ORAL_TABLET | Freq: Two times a day (BID) | ORAL | 1 refills | Status: DC

## 2021-09-25 NOTE — Hospital Course (Addendum)
88 year old female with past medical history of CAD status post CABG x3, diastolic heart failure, hypertension and anxiety who presented to the emergency room on 9/13 with complaints of chest pressure along with associated shortness of breath and radiation to her jaw that woke her up early that morning.  In the emergency room, initial EKG and enzymes unremarkable, although BNP mildly elevated and patient given 1 dose of Lasix.  Patient unable to void after Lasix and noted to have urinary retention and catheter placed.  Patient was seen by cardiology and given the patient was chest pain-free, negative troponins and shortness of breath seemed more related to exertion felt that ischemia unlikely and patient declined invasive procedure as well.  Patient monitored overnight.  By following day, patient feeling better.  No further chest pain.  Troponins remained negative.  Foley catheter removed and patient able to void small amount of urine.  Diuresed over 1300 cc of urine.

## 2021-09-25 NOTE — Plan of Care (Signed)
  Problem: Health Behavior/Discharge Planning: Goal: Ability to manage health-related needs will improve Outcome: Progressing   Problem: Clinical Measurements: Goal: Respiratory complications will improve Outcome: Progressing   Problem: Clinical Measurements: Goal: Cardiovascular complication will be avoided Outcome: Progressing   Problem: Activity: Goal: Risk for activity intolerance will decrease Outcome: Progressing   Problem: Pain Managment: Goal: General experience of comfort will improve Outcome: Progressing

## 2021-09-25 NOTE — Assessment & Plan Note (Signed)
Meets criteria BMI greater than 30 

## 2021-09-25 NOTE — Discharge Summary (Signed)
Physician Discharge Summary   Patient: [name] MRN: [id number] DOB: 10/03/1930  Admit date:     09/24/2021  Discharge date: 09/25/21  Discharge Physician: Annita Brod   PCP: Lajean Manes, MD   Recommendations at discharge:   Medication change: Toprol XL changed to metoprolol 50 mg twice daily Patient will follow-up with cardiology as outpatient  Discharge Diagnoses: Principal Problem:   Acute on chronic diastolic CHF (congestive heart failure) ([place]) Active Problems:   Essential hypertension   Coronary artery disease of native artery of native heart with stable angina pectoris (HCC)   Anxiety   Obesity (BMI 30-39.9)  Resolved Problems:   Chest pain   Acute urinary retention  Hospital Course: 88 year old female with past medical history of CAD status post CABG x3, diastolic heart failure, hypertension and anxiety who presented to the emergency room on 9/13 with complaints of chest pressure along with associated shortness of breath and radiation to her jaw that woke her up early that morning.  In the emergency room, initial EKG and enzymes unremarkable, although BNP mildly elevated and patient given 1 dose of Lasix.  Patient unable to void after Lasix and noted to have urinary retention and catheter placed.  Patient was seen by cardiology and given the patient was chest pain-free, negative troponins and shortness of breath seemed more related to exertion felt that ischemia unlikely and patient declined invasive procedure as well.  Patient monitored overnight.  By following day, patient feeling better.  No further chest pain.  Troponins remained negative.  Foley catheter removed and patient able to void small amount of urine.  Diuresed over 1300 cc of urine.  Assessment and Plan: * Acute on chronic diastolic CHF (congestive heart failure) (HCC) Echocardiogram done 9/13 notes no change from previous echocardiogram last November.  Preserved ejection fraction and grade 1  diastolic dysfunction.  Patient responded to Lasix x1 with 1.3 L diuresed.  Feeling better.  Follow-up with cardiology as outpatient.  Chest pain-resolved as of 09/25/2021 Looks to more musculoskeletal as it was reproducible with pain.  Cleared by cardiology.  Shortness of breath secondary to CHF.  Troponins remain normal and EKG unremarkable.  Acute urinary retention-resolved as of 09/25/2021 Patient received Lasix in the ER and was unable to void despite having a purewick catheter in place.  Bladder scan noting greater than 600 cc of urine a Foley catheter placed.  By following day, Foley catheter removed and patient able to void  Essential hypertension Continued on her home medications.  Metoprolol XR changed to twice a day for better rate control as per cardiology.  Anxiety Continue as needed lorazepam  Obesity (BMI 30-39.9) Meets criteria BMI greater than 30         Consultants: Cardiology Procedures performed: Echo cardiogram noting grade 1 diastolic dysfunction Disposition: Home with home health PT Diet recommendation:  Cardiac diet DISCHARGE MEDICATION: Allergies as of 09/25/2021       Reactions   Sertraline Hcl Nausea Only   Biaxin [clarithromycin]    depressed   Celecoxib Other (See Comments)   Reaction: Unknown   Ciprofloxacin    Codeine Other (See Comments)   Reaction: Unknown   Ferrous Sulfate Other (See Comments)   Hydromorphone    Lisinopril    cough   Macrobid [nitrofurantoin Monohyd Macro] Other (See Comments)   Reaction: Unknown   Oxycodone Hcl Other (See Comments)   Reaction: Unknown   Prednisone    Sertraline Hcl Other (See Comments)   Reaction: Unknown  Penicillins Rash   Reaction: Unknown        Medication List     STOP taking these medications    metoprolol succinate 50 MG 24 hr tablet Commonly known as: TOPROL-XL   pantoprazole 40 MG tablet Commonly known as: PROTONIX       TAKE these medications    acetaminophen 325 MG  tablet Commonly known as: TYLENOL Take 2 tablets (650 mg total) by mouth every 6 (six) hours as needed for mild pain or headache.   amLODipine 5 MG tablet Commonly known as: NORVASC Take 1 tablet (5 mg total) by mouth 2 (two) times daily. 1 tablet in the morning and half tablet at night.   aspirin 81 MG tablet Take 81 mg by mouth daily.   atorvastatin 40 MG tablet Commonly known as: LIPITOR Take 1 tablet (40 mg total) by mouth daily.   CALCIUM 600 + D PO Take 1 tablet by mouth daily. 600 mg/ 400 mg   calcium carbonate 750 MG chewable tablet Commonly known as: TUMS EX Chew 2 tablets by mouth as needed for heartburn.   estradiol 0.5 MG tablet Commonly known as: ESTRACE Take 0.5 mg by mouth daily.   famotidine 20 MG tablet Commonly known as: PEPCID Take 20 mg by mouth 2 (two) times daily.   furosemide 40 MG tablet Commonly known as: LASIX Take 1 tablet (40 mg total) by mouth daily.   LORazepam 0.5 MG tablet Commonly known as: ATIVAN Take 1 tablet (0.5 mg total) by mouth every 8 (eight) hours as needed for anxiety.   metoprolol tartrate 50 MG tablet Commonly known as: LOPRESSOR Take 1 tablet (50 mg total) by mouth 2 (two) times daily.   multivitamin tablet Take 1 tablet by mouth daily.   nitroGLYCERIN 0.4 MG SL tablet Commonly known as: NITROSTAT Place 1 tablet (0.4 mg total) under the tongue every 5 (five) minutes as needed for chest pain.   potassium chloride SA 20 MEQ tablet Commonly known as: Klor-Con M20 Take 1 tablet (20 mEq total) by mouth 2 (two) times daily.   telmisartan 80 MG tablet Commonly known as: MICARDIS Take 1 tablet (80 mg total) by mouth daily.               Durable Medical Equipment  (From admission, onward)           Start     Ordered   09/25/21 1520  For home use only DME Walker rolling  Once       Question Answer Comment  Walker: With 5 Inch Wheels   Patient needs a walker to treat with the following condition Acute on  chronic diastolic CHF (congestive heart failure) ([place])      09/25/21 1519            Follow-up Information     Gollan, Kathlene November, MD. Schedule an appointment as soon as possible for a visit in 1 month(s).   Specialty: Cardiology Contact information: [address] [place] [address] [phone]                Discharge Exam: Danley Danker Weights   09/24/21 0738  Weight: 90.7 kg   General: Alert and oriented x3, no acute distress Cardiovascular: Regular rate and rhythm, S1-S2 Lungs: Clear to auscultation bilaterally  Condition at discharge: good  The results of significant diagnostics from this hospitalization (including imaging, microbiology, ancillary and laboratory) are listed below for reference.   Imaging Studies: ECHOCARDIOGRAM COMPLETE  Result Date: 09/25/2021    ECHOCARDIOGRAM REPORT   Patient Name:   DELORISE HUNKELE Date of Exam: 09/24/2021 Medical Rec #:  [id number]    Height:       67.0 in Accession #:    [id number]   Weight:       200.0 lb Date of Birth:  [birth year]    BSA:          2.022 m Patient Age:    14 years     BP:           156/61 mmHg Patient Gender: F            HR:           74 bpm. Exam Location:  [facility] Procedure: 2D Echo, Cardiac Doppler and Color Doppler Indications:     R01.1 Murmur                  R06.00 Dyspnea  History:         Patient has prior history of Echocardiogram examinations, most                  recent 11/14/2020. CAD, Prior CABG, Arrythmias:PVC's; Risk                  Factors:Hypertension and Dyslipidemia. TIA. Pneumonia.  Sonographer:     Wilford Sports Rodgers-Jones RDCS Referring Phys:  SHERI HAMMOCK Diagnosing Phys: Ida Rogue MD IMPRESSIONS  1. Left ventricular ejection fraction, by estimation, is 60 to 65%. The left ventricle has normal function. The left ventricle has no regional wall motion abnormalities. There is mild left ventricular hypertrophy. Left ventricular diastolic parameters are consistent with Grade I  diastolic dysfunction (impaired relaxation).  2. Right ventricular systolic function is normal. The right ventricular size is normal. There is normal pulmonary artery systolic pressure. The estimated right ventricular systolic pressure is 06.3 mmHg.  3. The mitral valve is normal in structure. Moderate mitral valve regurgitation. No evidence of mitral stenosis.  4. Tricuspid valve regurgitation is moderate.  5. The aortic valve has an indeterminant number of cusps. Aortic valve regurgitation is not visualized. No aortic stenosis is present. Aortic valve mean gradient measures 7.5 mmHg.  6. The inferior vena cava is normal in size with greater than 50% respiratory variability, suggesting right atrial pressure of 3 mmHg. FINDINGS  Left Ventricle: Left ventricular ejection fraction, by estimation, is 60 to 65%. The left ventricle has normal function. The left ventricle has no regional wall motion abnormalities. The left ventricular internal cavity size was normal in size. There is  mild left ventricular hypertrophy. Left ventricular diastolic parameters are consistent with Grade I diastolic dysfunction (impaired relaxation). Right Ventricle: The right ventricular size is normal. No increase in right ventricular wall thickness. Right ventricular systolic function is normal. There is normal pulmonary artery systolic pressure. The tricuspid regurgitant velocity is 2.34 m/s, and  with an assumed right atrial pressure of 5 mmHg, the estimated right ventricular systolic pressure is 01.6 mmHg. Left Atrium: Left atrial size was normal in size. Right Atrium: Right atrial size was normal in size. Pericardium: There is no evidence of pericardial effusion. Mitral Valve: The mitral valve is normal in structure. Mild mitral annular calcification. Moderate mitral valve regurgitation. No evidence of mitral valve stenosis. Tricuspid Valve: The tricuspid valve is normal in structure. Tricuspid valve regurgitation is moderate . No  evidence of tricuspid stenosis. Aortic Valve: The aortic valve has an indeterminant number of cusps. Aortic valve regurgitation  is not visualized. No aortic stenosis is present. Aortic valve mean gradient measures 7.5 mmHg. Aortic valve peak gradient measures 13.5 mmHg. Pulmonic Valve: The pulmonic valve was normal in structure. Pulmonic valve regurgitation is not visualized. No evidence of pulmonic stenosis. Aorta: The aortic root is normal in size and structure. Venous: The inferior vena cava is normal in size with greater than 50% respiratory variability, suggesting right atrial pressure of 3 mmHg. IAS/Shunts: No atrial level shunt detected by color flow Doppler.  LEFT VENTRICLE PLAX 2D LVIDd:         4.20 cm Diastology LVIDs:         2.30 cm LV e' medial:    5.44 cm/s LV PW:         0.60 cm LV E/e' medial:  18.8 LV IVS:        1.20 cm LV e' lateral:   7.94 cm/s                        LV E/e' lateral: 12.8  RIGHT VENTRICLE RV S prime:     6.74 cm/s TAPSE (M-mode): 1.5 cm LEFT ATRIUM             Index        RIGHT ATRIUM           Index LA diam:        3.70 cm 1.83 cm/m   RA Area:     15.30 cm LA Vol (A2C):   29.9 ml 14.79 ml/m  RA Volume:   41.50 ml  20.52 ml/m LA Vol (A4C):   14.4 ml 7.12 ml/m LA Biplane Vol: 21.3 ml 10.53 ml/m  AORTIC VALVE AV Vmax:           184.00 cm/s AV Vmean:          126.500 cm/s AV VTI:            0.406 m AV Peak Grad:      13.5 mmHg AV Mean Grad:      7.5 mmHg LVOT Vmax:         80.60 cm/s LVOT Vmean:        54.850 cm/s LVOT VTI:          0.150 m LVOT/AV VTI ratio: 0.37  AORTA Ao Root diam: 3.30 cm MITRAL VALVE                TRICUSPID VALVE MV Area (PHT): 2.50 cm     TR Peak grad:   21.9 mmHg MV Decel Time: 303 msec     TR Vmax:        234.00 cm/s MV E velocity: 102.00 cm/s MV A velocity: 128.00 cm/s  SHUNTS MV E/A ratio:  0.80         Systemic VTI: 0.15 m Ida Rogue MD Electronically signed by Ida Rogue MD Signature Date/Time: 09/25/2021/1:55:25 PM    Final    DG  Chest 2 View  Result Date: 09/24/2021 CLINICAL DATA:  Chest pain radiating to jaw. History of triple bypass. EXAM: CHEST - 2 VIEW COMPARISON:  Chest two views 12/04/2019 FINDINGS: Heart size is again mildly enlarged. Mediastinal contours are within normal limits with moderate calcification within the aortic arch. Status post median sternotomy and CABG. The lungs are clear. Mild biapical scarring is similar to prior. No pleural effusion or pneumothorax. Mild multilevel degenerative disc changes of the thoracic spine. IMPRESSION: No acute cardiopulmonary disease process. Electronically Signed  By: Yvonne Kendall M.D.   On: 09/24/2021 08:13    Microbiology: Results for orders placed or performed during the hospital encounter of 09/24/21  SARS Coronavirus 2 by RT PCR (hospital order, performed in [facility] hospital lab) *cepheid single result test* Anterior Nasal Swab     Status: None   Collection Time: 09/24/21  8:15 AM   Specimen: Anterior Nasal Swab  Result Value Ref Range Status   SARS Coronavirus 2 by RT PCR NEGATIVE NEGATIVE Final    Comment: (NOTE) SARS-CoV-2 target nucleic acids are NOT DETECTED.  The SARS-CoV-2 RNA is generally detectable in upper and lower respiratory specimens during the acute phase of infection. The lowest concentration of SARS-CoV-2 viral copies this assay can detect is 250 copies / mL. A negative result does not preclude SARS-CoV-2 infection and should not be used as the sole basis for treatment or other patient management decisions.  A negative result may occur with improper specimen collection / handling, submission of specimen other than nasopharyngeal swab, presence of viral mutation(s) within the areas targeted by this assay, and inadequate number of viral copies (<250 copies / mL). A negative result must be combined with clinical observations, patient history, and epidemiological information.  Fact Sheet for Patients:    https://www.patel.info/  Fact Sheet for Healthcare Providers: https://hall.com/  This test is not yet approved or  cleared by the [place] FDA and has been authorized for detection and/or diagnosis of SARS-CoV-2 by FDA under an Emergency Use Authorization (EUA).  This EUA will remain in effect (meaning this test can be used) for the duration of the COVID-19 declaration under Section 564(b)(1) of the Act, 21 U.S.C. section 360bbb-3(b)(1), unless the authorization is terminated or revoked sooner.  Performed at [facility], [address] [place], [place] [address]     Labs: CBC: Recent Labs  Lab 09/24/21 0741 09/25/21 0357  WBC 8.1 6.4  NEUTROABS 6.0  --   HGB 13.1 12.1  HCT 39.1 35.6*  MCV 90.9 89.4  PLT 153 981   Basic Metabolic Panel: Recent Labs  Lab 09/24/21 0741 09/25/21 0357  NA 137 140  K 3.7 3.4*  CL 104 106  CO2 24 25  GLUCOSE 134* 107*  BUN 15 18  CREATININE 1.02* 1.08*  CALCIUM 9.1 8.7*  MG 1.9  --    Liver Function Tests: Recent Labs  Lab 09/24/21 0741  AST 28  ALT 23  ALKPHOS 74  BILITOT 1.7*  PROT 6.5  ALBUMIN 3.8   CBG: No results for input(s): "GLUCAP" in the last 168 hours.  Discharge time spent: less than 30 minutes.  Signed: Annita Brod, MD [facility] 09/25/2021

## 2021-09-25 NOTE — Progress Notes (Signed)
Rounding Note    Patient Name: [name] Date of Encounter: 09/25/2021  [place] Cardiologist: Ida Rogue, MD   Subjective   Patient reports she is wanting to go home. She has occasional sharp chest pain reproducible on exam. Still has Foley cath in place.  Inpatient Medications    Scheduled Meds:  alum & mag hydroxide-simeth  30 mL Oral Once   aspirin EC  81 mg Oral Daily   atorvastatin  40 mg Oral QPM   calcium-vitamin D  1 tablet Oral Daily   enoxaparin (LOVENOX) injection  0.5 mg/kg Subcutaneous Q24H   estradiol  0.5 mg Oral Daily   furosemide  40 mg Oral Daily   irbesartan  300 mg Oral Daily   metoprolol tartrate  50 mg Oral BID   multivitamin with minerals  1 tablet Oral Daily   pantoprazole  40 mg Oral Daily   potassium chloride SA  20 mEq Oral BID   sodium chloride flush  3 mL Intravenous Q12H   Continuous Infusions:  sodium chloride     PRN Meds: sodium chloride, acetaminophen, calcium carbonate, LORazepam, nitroGLYCERIN, ondansetron **OR** ondansetron (ZOFRAN) IV, sodium chloride flush   Vital Signs    Vitals:   09/25/21 0009 09/25/21 0511 09/25/21 0511 09/25/21 0756  BP: (!) 141/61 (!) 153/67 (!) 153/67 (!) 146/77  Pulse: (!) 58 77 77 (!) 58  Resp: '18 18 18 19  '$ Temp: 98.4 F (36.9 C) 98.4 F (36.9 C) 98.4 F (36.9 C) 98.1 F (36.7 C)  TempSrc:    Oral  SpO2: 95% 96% 97% 97%  Weight:      Height:        Intake/Output Summary (Last 24 hours) at 09/25/2021 0937 Last data filed at 09/25/2021 0500 Gross per 24 hour  Intake --  Output 400 ml  Net -400 ml      09/24/2021    7:38 AM 05/12/2021    2:35 PM 10/08/2020    4:21 PM  Last 3 Weights  Weight (lbs) 200 lb 201 lb 4 oz 199 lb 6 oz  Weight (kg) 90.719 kg 91.286 kg 90.436 kg      Telemetry    NSR HR 70s, PVCs/PACs - Personally Reviewed  ECG    No new - Personally Reviewed  Physical Exam   GEN: No acute distress.   Neck: No JVD Cardiac: RRR, +murmur, no rubs, or  gallops; TTP Respiratory: Clear to auscultation bilaterally. GI: Soft, nontender, non-distended  MS: No edema; No deformity. Neuro:  Nonfocal  Psych: Normal affect   Labs    High Sensitivity Troponin:   Recent Labs  Lab 09/24/21 0741 09/24/21 0942  TROPONINIHS 12 17     Chemistry Recent Labs  Lab 09/24/21 0741 09/25/21 0357  NA 137 140  K 3.7 3.4*  CL 104 106  CO2 24 25  GLUCOSE 134* 107*  BUN 15 18  CREATININE 1.02* 1.08*  CALCIUM 9.1 8.7*  MG 1.9  --   PROT 6.5  --   ALBUMIN 3.8  --   AST 28  --   ALT 23  --   ALKPHOS 74  --   BILITOT 1.7*  --   GFRNONAA 53* 49*  ANIONGAP 9 9    Lipids No results for input(s): "CHOL", "TRIG", "HDL", "LABVLDL", "LDLCALC", "CHOLHDL" in the last 168 hours.  Hematology Recent Labs  Lab 09/24/21 0741 09/25/21 0357  WBC 8.1 6.4  RBC 4.30 3.98  HGB 13.1 12.1  HCT 39.1 35.6*  MCV 90.9 89.4  MCH 30.5 30.4  MCHC 33.5 34.0  RDW 12.4 12.5  PLT 153 152   Thyroid No results for input(s): "TSH", "FREET4" in the last 168 hours.  BNP Recent Labs  Lab 09/24/21 0741  BNP 399.2*    DDimer  Recent Labs  Lab 09/24/21 1727  DDIMER 1.22*     Radiology    DG Chest 2 View  Result Date: 09/24/2021 CLINICAL DATA:  Chest pain radiating to jaw. History of triple bypass. EXAM: CHEST - 2 VIEW COMPARISON:  Chest two views 12/04/2019 FINDINGS: Heart size is again mildly enlarged. Mediastinal contours are within normal limits with moderate calcification within the aortic arch. Status post median sternotomy and CABG. The lungs are clear. Mild biapical scarring is similar to prior. No pleural effusion or pneumothorax. Mild multilevel degenerative disc changes of the thoracic spine. IMPRESSION: No acute cardiopulmonary disease process. Electronically Signed   By: Yvonne Kendall M.D.   On: 09/24/2021 08:13    Cardiac Studies   TTE 11/14/20 1. Challenging images   2. Left ventricular ejection fraction, by estimation, is 55 to 60%. The  left  ventricle has normal function. The left ventricle has no regional  wall motion abnormalities. Left ventricular diastolic parameters are  consistent with Grade I diastolic  dysfunction (impaired relaxation).   3. Right ventricular systolic function is normal. The right ventricular  size is normal. There is normal pulmonary artery systolic pressure. The  estimated right ventricular systolic pressure is 60.1 mmHg.   4. The mitral valve is normal in structure. Mild mitral valve  regurgitation. No evidence of mitral stenosis.   5. Tricuspid valve regurgitation is mild to moderate.   6. The aortic valve was not well visualized. There is moderate  calcification of the aortic valve. Aortic valve regurgitation is not  visualized. Mild to moderate aortic valve sclerosis/calcification is  present, without any evidence of aortic stenosis.   7. The inferior vena cava is normal in size with greater than 50%  respiratory variability, suggesting right atrial pressure of 3 mmHg.     Patient Profile     88 y.o. female with a hx of coronary artery disease status post CABG x3, hypertension, hyperlipidemia, anxiety, bilateral carotid artery disease, history of TIA who is being seen 09/24/2021 for the evaluation of chest pain   Assessment & Plan    Chest pain CAD s/p CABG and PCI/DES - troponin negative x 2 - EKG non-acute - she has MSK pain on exam - Echo results pending - continue statin and ASA - Lopressor increased to '50mg'$  BID - no plan for invasive work-up  SOB - BNP 399 in the ER - IV lasix '40mg'$  given in the ER - echo as above - PTA lasix '40mg'$  daily  Murmur - h/o aortic valve calcification - echo repeated as above  HTN - Avapro '300mg'$  daily - Lopressor '50mg'$  BID - PTA amlodipine held   [place]/PVCs - Mag 1.9 - K 3.7, goal>4 - lopressor increased as above  For questions or updates, please contact [place] Please consult [email or web address] for contact info under         Signed, Masie Bermingham Ninfa Meeker, PA-C  09/25/2021, 9:37 AM

## 2021-09-25 NOTE — Assessment & Plan Note (Signed)
Echocardiogram done 9/13 notes no change from previous echocardiogram last November.  Preserved ejection fraction and grade 1 diastolic dysfunction.  Patient responded to Lasix x1 with 1.3 L diuresed.  Feeling better.  Follow-up with cardiology as outpatient.

## 2021-10-02 DIAGNOSIS — I129 Hypertensive chronic kidney disease with stage 1 through stage 4 chronic kidney disease, or unspecified chronic kidney disease: Secondary | ICD-10-CM | POA: Diagnosis not present

## 2021-10-02 DIAGNOSIS — K219 Gastro-esophageal reflux disease without esophagitis: Secondary | ICD-10-CM | POA: Diagnosis not present

## 2021-10-02 DIAGNOSIS — N1831 Chronic kidney disease, stage 3a: Secondary | ICD-10-CM | POA: Diagnosis not present

## 2021-10-02 DIAGNOSIS — F3340 Major depressive disorder, recurrent, in remission, unspecified: Secondary | ICD-10-CM | POA: Diagnosis not present

## 2021-10-02 DIAGNOSIS — E7800 Pure hypercholesterolemia, unspecified: Secondary | ICD-10-CM | POA: Diagnosis not present

## 2021-10-02 DIAGNOSIS — I272 Pulmonary hypertension, unspecified: Secondary | ICD-10-CM | POA: Diagnosis not present

## 2021-10-02 DIAGNOSIS — I2510 Atherosclerotic heart disease of native coronary artery without angina pectoris: Secondary | ICD-10-CM | POA: Diagnosis not present

## 2021-10-17 ENCOUNTER — Ambulatory Visit: Payer: Medicare Other | Attending: Medical | Admitting: Medical

## 2021-10-17 ENCOUNTER — Encounter: Payer: Self-pay | Admitting: Medical

## 2021-10-17 VITALS — BP 128/62 | HR 57 | Ht 67.0 in | Wt 201.6 lb

## 2021-10-17 DIAGNOSIS — I493 Ventricular premature depolarization: Secondary | ICD-10-CM | POA: Diagnosis not present

## 2021-10-17 DIAGNOSIS — R0789 Other chest pain: Secondary | ICD-10-CM | POA: Insufficient documentation

## 2021-10-17 DIAGNOSIS — I10 Essential (primary) hypertension: Secondary | ICD-10-CM | POA: Insufficient documentation

## 2021-10-17 DIAGNOSIS — I5032 Chronic diastolic (congestive) heart failure: Secondary | ICD-10-CM | POA: Insufficient documentation

## 2021-10-17 DIAGNOSIS — E782 Mixed hyperlipidemia: Secondary | ICD-10-CM | POA: Insufficient documentation

## 2021-10-17 DIAGNOSIS — I25118 Atherosclerotic heart disease of native coronary artery with other forms of angina pectoris: Secondary | ICD-10-CM | POA: Insufficient documentation

## 2021-10-17 NOTE — Patient Instructions (Signed)
Medication Instructions:  - Your physician recommends that you continue on your current medications as directed. Please refer to the Current Medication list given to you today.  *If you need a refill on your cardiac medications before your next appointment, please call your pharmacy*   Lab Work: - none ordered  If you have labs (blood work) drawn today and your tests are completely normal, you will receive your results only by: [place] (if you have MyChart) OR A paper copy in the mail If you have any lab test that is abnormal or we need to change your treatment, we will call you to review the results.   Testing/Procedures: - none ordered   Follow-Up: At [facility], you and your health needs are our priority.  As part of our continuing mission to provide you with exceptional heart care, we have created designated Provider Care Teams.  These Care Teams include your primary Cardiologist (physician) and Advanced Practice Providers (APPs -  Physician Assistants and Nurse Practitioners) who all work together to provide you with the care you need, when you need it.  We recommend signing up for the patient portal called "MyChart".  Sign up information is provided on this After Visit Summary.  MyChart is used to connect with patients for Virtual Visits (Telemedicine).  Patients are able to view lab/test results, encounter notes, upcoming appointments, etc.  Non-urgent messages can be sent to your provider as well.   To learn more about what you can do with MyChart, go to [email or web address].    Your next appointment:   3 month(s)  The format for your next appointment:   In Person  Provider:   You may see Ida Rogue, MD or one of the following Advanced Practice Providers on your designated Care Team:    Cadence Kathlen Mody, [place]   Other Instructions N/a  Important Information About Sugar

## 2021-10-17 NOTE — Progress Notes (Signed)
Cardiology Office Note:    Date:  10/19/2021   ID:  [name], DOB Oct 18, 1930, MRN [id number]  PCP:  Lajean Manes, MD  [facility] Cardiologist:  Ida Rogue, MD  [facility] Electrophysiologist:  None   Referring MD: Lajean Manes, MD   Chief Complaint: hospital follow-up  History of Present Illness:    [name] is a 88 y.o. female with a hx of of coronary artery disease status post CABG x3, hypertension, hyperlipidemia, anxiety, bilateral carotid artery disease, history of TIA who presents for hospital follow-up.   Ms. [name] is an 88 year old female with a history of coronary disease status post CABG x3 in 12/2016 with postoperative atrial fibrillation, hypertension, hyperlipidemia, gallstones status post ERCP in 02/2018 anxiety, bilateral carotid artery disease, history of TIA who presented to the emergency department for evaluation of chest pain that started 1 day prior to admission.   She had previous PCI and underwent CABG x3 in 12/2016 after cardiac cath showed severe three-vessel disease including 9% distal LMCA stenosis as well as CTO of RCA.  Her CABG occluded LIMA-LAD, SVG-PDA, SVG -OM.  Echocardiogram in 11/2017 revealed LVEF 55-60%, no wall motion abnormalities, G1 DD, moderate MR, mild TR, moderate pulmonary hypertension.  An echo in 11/2020 revealed LVEF 55-60%, no regional wall motion abnormalities, G1 DD, mild MR, and mild to moderate TR.   The patient was Admitted in September 2023 with chest discomfort. HS trop was negative x 2. Imdur was started. BNP was 399 and was given IV lasix. Toprol was increase for bigeminy.  Today, the patient reports two episodes of cramps in the chest. Cramping lasts a few minutes. One episode occurred when walking. She denies associated symptoms. No orthopnea or pnd. She eats low salt diet. She elevates her feet. She has swelling occasionally. She has occasional dizziness and lightheadedness. Weight has been stable around  201lbs.   Past Medical History:  Diagnosis Date   Anxiety    Atypical carcinoid lung tumor ([place])    CAD (coronary artery disease)    a. 1986: s/p PTCA of bifurcation lesion in LAD;  b. 2001: s/p BMS to LAD & LCX;  c. 04/2006 Cath: diff CAD w/o critical narrowing, nl EF;  d. 12/2014 MV: inflat ST dep, mild basal inf ischemia, EF 55-65%-->Low risk; e. 12/2016 - 3V CABG.   Cancer [facility]) yrs ago her pt   right lobe of lung. surgery only.    Carotid disease, bilateral ([place])    a. 08/2015 Carotid U/S: < 39% bilat ICA stenosis.   Cataracts, bilateral    Cholangitis    Choledocholithiasis    Cholelithiases    Chronic diarrhea    Depression    Frequent PVCs    Gilbert's syndrome    Headache    PMH: migraines   History of echocardiogram    a. 06/2010 Echo: EF 60-65%, mild AI/MR; b. 10/2015 Echo: EF 60-65%, no rwma, mild MR, nl RV fxn.   HTN (hypertension)    Hyperlipidemia    Impaired fasting blood sugar    Keloid    mid sternum   Macular degeneration    Osteoarthritis    Pneumonia    PONV (postoperative nausea and vomiting)    Positional vertigo    benign   Postoperative atrial fibrillation ([place]) 12/2016   S/P CABG x 3 12/23/2016   LIMA to LAD, SVG to OM, SVG to PDA, EVH via left thigh   TIA (transient ischemic attack)    Wears glasses  Wears hearing aid     Past Surgical History:  Procedure Laterality Date   ABDOMINAL AORTOGRAM N/A 12/22/2016   Procedure: ABDOMINAL AORTOGRAM;  Surgeon: Nelva Bush, MD;  Location: [place] [facility];  Service: Cardiovascular;  Laterality: N/A;   ABDOMINAL HYSTERECTOMY     BILIARY STENT PLACEMENT  12/10/2017   Procedure: BILIARY STENT PLACEMENT;  Surgeon: Irving Copas., MD;  Location: [place];  Service: Gastroenterology;;   CHOLECYSTECTOMY     CORONARY ARTERY BYPASS GRAFT N/A 12/23/2016   Procedure: CORONARY ARTERY BYPASS GRAFTING (CABG) TIMES THREE USING LEFT INTERNAL MAMMARY ARTERY AND LEFT SAPHENOUS LEG VEIN HARVESTED  ENDOSCOPICALLY,;  Surgeon: Rexene Alberts, MD;  Location: [place];  Service: Open Heart Surgery;  Laterality: N/A;   ENDOSCOPIC RETROGRADE CHOLANGIOPANCREATOGRAPHY (ERCP) WITH PROPOFOL N/A 02/21/2018   Procedure: ENDOSCOPIC RETROGRADE CHOLANGIOPANCREATOGRAPHY (ERCP) WITH PROPOFOL;  Surgeon: Rush Landmark Telford Nab., MD;  Location: [place];  Service: Gastroenterology;  Laterality: N/A;   ERCP N/A 12/10/2017   Procedure: ENDOSCOPIC RETROGRADE CHOLANGIOPANCREATOGRAPHY (ERCP);  Surgeon: Irving Copas., MD;  Location: [place];  Service: Gastroenterology;  Laterality: N/A;   IABP INSERTION N/A 12/22/2016   Procedure: IABP Insertion;  Surgeon: Nelva Bush, MD;  Location: [place] [facility];  Service: Cardiovascular;  Laterality: N/A;   LEFT HEART CATH AND CORONARY ANGIOGRAPHY N/A 12/22/2016   Procedure: LEFT HEART CATH AND CORONARY ANGIOGRAPHY;  Surgeon: Nelva Bush, MD;  Location: [place] [facility];  Service: Cardiovascular;  Laterality: N/A;   LEFT HEART CATHETERIZATION WITH CORONARY ANGIOGRAM N/A 04/27/2012   Procedure: LEFT HEART CATHETERIZATION WITH CORONARY ANGIOGRAM;  Surgeon: Peter M Martinique, MD;  Location: [facility];  Service: Cardiovascular;  Laterality: N/A;   LOBECTOMY Right    RLL atypical carcinoid tumor - Dr Eugenia Mcalpine  1986   PCI of LAD  2001   with non des lad    pci of mid cfx     with non des and cutting balloon pci of av circumflex   REMOVAL OF STONES  02/21/2018   Procedure: REMOVAL OF STONES;  Surgeon: Irving Copas., MD;  Location: [place];  Service: Gastroenterology;;   Joan Mayans  02/21/2018   Procedure: Joan Mayans;  Surgeon: Irving Copas., MD;  Location: [place];  Service: Gastroenterology;;   Lavell Islam REMOVAL  02/21/2018   Procedure: STENT REMOVAL;  Surgeon: Irving Copas., MD;  Location: [place];  Service: Gastroenterology;;   TEE WITHOUT CARDIOVERSION N/A 12/23/2016   Procedure:  TRANSESOPHAGEAL ECHOCARDIOGRAM (TEE);  Surgeon: Rexene Alberts, MD;  Location: [place];  Service: Open Heart Surgery;  Laterality: N/A;    Current Medications: Current Meds  Medication Sig   acetaminophen (TYLENOL) 325 MG tablet Take 2 tablets (650 mg total) by mouth every 6 (six) hours as needed for mild pain or headache.   amLODipine (NORVASC) 5 MG tablet Take 1 tablet (5 mg total) by mouth 2 (two) times daily. 1 tablet in the morning and half tablet at night.   aspirin 81 MG tablet Take 81 mg by mouth daily.   atorvastatin (LIPITOR) 40 MG tablet Take 1 tablet (40 mg total) by mouth daily.   calcium carbonate (TUMS EX) 750 MG chewable tablet Chew 2 tablets by mouth as needed for heartburn.   Calcium Carbonate-Vitamin D (CALCIUM 600 + D PO) Take 1 tablet by mouth daily. 600 mg/ 400 mg   estradiol (ESTRACE) 0.5 MG tablet Take 0.5 mg by mouth daily.   famotidine (PEPCID) 20 MG tablet  Take 20 mg by mouth 2 (two) times daily.   furosemide (LASIX) 40 MG tablet Take 1 tablet (40 mg total) by mouth daily.   LORazepam (ATIVAN) 0.5 MG tablet Take 1 tablet (0.5 mg total) by mouth every 8 (eight) hours as needed for anxiety.   metoprolol tartrate (LOPRESSOR) 50 MG tablet Take 1 tablet (50 mg total) by mouth 2 (two) times daily.   Multiple Vitamin (MULTIVITAMIN) tablet Take 1 tablet by mouth daily.   nitroGLYCERIN (NITROSTAT) 0.4 MG SL tablet Place 1 tablet (0.4 mg total) under the tongue every 5 (five) minutes as needed for chest pain.   potassium chloride SA (KLOR-CON M20) 20 MEQ tablet Take 1 tablet (20 mEq total) by mouth 2 (two) times daily.   telmisartan (MICARDIS) 80 MG tablet Take 1 tablet (80 mg total) by mouth daily.     Allergies:   Sertraline hcl, Biaxin [clarithromycin], Celecoxib, Ciprofloxacin, Codeine, Ferrous sulfate, Hydromorphone, Lisinopril, Macrobid [nitrofurantoin monohyd macro], Oxycodone hcl, Prednisone, Sertraline hcl, and Penicillins   Social History   Socioeconomic History    Marital status: Divorced    Spouse name: Not on file   Number of children: Not on file   Years of education: Not on file   Highest education level: Not on file  Occupational History   Occupation: retired  Tobacco Use   Smoking status: Never   Smokeless tobacco: Never  Vaping Use   Vaping Use: Never used  Substance and Sexual Activity   Alcohol use: No   Drug use: No   Sexual activity: Not on file  Other Topics Concern   Not on file  Social History Narrative   Not on file   Social Determinants of Health   Financial Resource Strain: Not on file  Food Insecurity: No Food Insecurity (09/24/2021)   Hunger Vital Sign    Worried About Running Out of Food in the Last Year: Never true    Ran Out of Food in the Last Year: Never true  Transportation Needs: No Transportation Needs (09/24/2021)   PRAPARE - Hydrologist (Medical): No    Lack of Transportation (Non-Medical): No  Physical Activity: Unknown (02/03/2018)   Exercise Vital Sign    Days of Exercise per Week: 0 days    Minutes of Exercise per Session: Not on file  Stress: No Stress Concern Present (02/03/2018)   [place]    Feeling of Stress : Not at all  Social Connections: Unknown (02/03/2018)   Social Connection and Isolation Panel [NHANES]    Frequency of Communication with Friends and Family: Patient refused    Frequency of Social Gatherings with Friends and Family: Patient refused    Attends Religious Services: Patient refused    Marine scientist or Organizations: Patient refused    Attends Music therapist: Patient refused    Marital Status: Patient refused     Family History: The patient's family history includes CAD in her father and mother; Diabetes Mellitus I in her brother, mother, sister, and sister. There is no history of Colon cancer, Esophageal cancer, Inflammatory bowel disease, Liver disease,  Pancreatic cancer, Rectal cancer, or Stomach cancer.  ROS:   Please see the history of present illness.     All other systems reviewed and are negative.  EKGs/Labs/Other Studies Reviewed:    The following studies were reviewed today:  Echo 09/2021  1. Left ventricular ejection fraction, by estimation, is 60  to 65%. The  left ventricle has normal function. The left ventricle has no regional  wall motion abnormalities. There is mild left ventricular hypertrophy.  Left ventricular diastolic parameters  are consistent with Grade I diastolic dysfunction (impaired relaxation).   2. Right ventricular systolic function is normal. The right ventricular  size is normal. There is normal pulmonary artery systolic pressure. The  estimated right ventricular systolic pressure is 07.8 mmHg.   3. The mitral valve is normal in structure. Moderate mitral valve  regurgitation. No evidence of mitral stenosis.   4. Tricuspid valve regurgitation is moderate.   5. The aortic valve has an indeterminant number of cusps. Aortic valve  regurgitation is not visualized. No aortic stenosis is present. Aortic  valve mean gradient measures 7.5 mmHg.   6. The inferior vena cava is normal in size with greater than 50%  respiratory variability, suggesting right atrial pressure of 3 mmHg.   EKG:  EKG is ordered today.  The ekg ordered today demonstrates SB, 57bpm, PVC, nonspecific ST/T wave changes  Recent Labs: 09/24/2021: ALT 23; B Natriuretic Peptide 399.2; Magnesium 1.9 09/25/2021: BUN 18; Creatinine, Ser 1.08; Hemoglobin 12.1; Platelets 152; Potassium 3.4; Sodium 140  Recent Lipid Panel    Component Value Date/Time   CHOL 121 02/04/2018 0537   CHOL 136 11/04/2013 0541   TRIG 167 (H) 02/04/2018 0537   TRIG 329 (H) 11/04/2013 0541   HDL 47 02/04/2018 0537   HDL 56 11/04/2013 0541   CHOLHDL 2.6 02/04/2018 0537   VLDL 33 02/04/2018 0537   VLDL 66 (H) 11/04/2013 0541   LDLCALC 41 02/04/2018 0537   LDLCALC 14  11/04/2013 0541    Physical Exam:    VS:  BP 128/62 (BP Location: Left Arm)   Pulse (!) 57   Ht '5\' 7"'$  (1.702 m)   Wt 201 lb 9.6 oz (91.4 kg)   SpO2 94%   BMI 31.58 kg/m     Wt Readings from Last 3 Encounters:  10/17/21 201 lb 9.6 oz (91.4 kg)  09/24/21 200 lb (90.7 kg)  05/12/21 201 lb 4 oz (91.3 kg)     GEN:  Well nourished, well developed in no acute distress HEENT: Normal NECK: No JVD; No carotid bruits LYMPHATICS: No lymphadenopathy CARDIAC: RRR, no murmurs, rubs, gallops RESPIRATORY:  Clear to auscultation without rales, wheezing or rhonchi  ABDOMEN: Soft, non-tender, non-distended MUSCULOSKELETAL:  No edema; No deformity  SKIN: Warm and dry NEUROLOGIC:  Alert and oriented x 3 PSYCHIATRIC:  Normal affect   ASSESSMENT:    1. Atypical chest pain   2. Coronary artery disease involving native coronary artery of native heart with other form of angina pectoris ([place])   3. Essential hypertension   4. Mixed hyperlipidemia   5. Chronic diastolic heart failure ([place])   6. PVC (premature ventricular contraction)    PLAN:    In order of problems listed above:  Atypical chest pain CAD s/p CABG and PCI/DES Patient reports cramping in chest, no associated symptoms, sounds mostly atypical. Recent hospitalization for chest pain with troponin negative x 2. Echo showed LVEF 60-65%. Continue Aspirin, Lipitor and BB therapy. Patient will continue to monitor symptoms.   Diastolic heart failure Echo showed LVEF 60-65% with G1DD. She is euvolemic on exam. She reports stable weights around 201lbs. Continue lasix '40mg'$  daily. We discussed low salt diet, leg elevation and leg compression.   Murmur Recent echo showed moderate MR and moderate TR.   HTN BP is good, continue current  medications.   PVCs EKG showed 1 PVC. Continue Lopressor '50mg'$  BID, cannot up-titrate with heart rate of 57bpm.   Disposition: Follow up in 3 month(s) with MD/APP    Signed, Raymondo Garcialopez Ninfa Meeker, PA-C   10/19/2021 8:52 PM    [facility]

## 2021-11-06 ENCOUNTER — Other Ambulatory Visit: Payer: Self-pay | Admitting: Cardiovascular Disease

## 2021-11-10 ENCOUNTER — Ambulatory Visit: Payer: Medicare Other | Admitting: Cardiovascular Disease

## 2021-11-19 DIAGNOSIS — N1831 Chronic kidney disease, stage 3a: Secondary | ICD-10-CM | POA: Diagnosis not present

## 2021-11-19 DIAGNOSIS — Z23 Encounter for immunization: Secondary | ICD-10-CM | POA: Diagnosis not present

## 2021-11-19 DIAGNOSIS — E7800 Pure hypercholesterolemia, unspecified: Secondary | ICD-10-CM | POA: Diagnosis not present

## 2021-12-20 ENCOUNTER — Other Ambulatory Visit: Payer: Self-pay | Admitting: Cardiovascular Disease

## 2022-01-23 ENCOUNTER — Other Ambulatory Visit
Admission: RE | Admit: 2022-01-23 | Discharge: 2022-01-23 | Disposition: A | Discharge status: Home / Self Care | Payer: Medicare Other | Source: Ambulatory Visit | Attending: Medical | Admitting: Medical

## 2022-01-23 ENCOUNTER — Encounter: Payer: Self-pay | Admitting: Medical

## 2022-01-23 ENCOUNTER — Ambulatory Visit: Payer: Medicare Other | Attending: Medical | Admitting: Medical

## 2022-01-23 VITALS — BP 134/70 | HR 65 | Ht 66.5 in | Wt 200.0 lb

## 2022-01-23 DIAGNOSIS — I5032 Chronic diastolic (congestive) heart failure: Secondary | ICD-10-CM | POA: Insufficient documentation

## 2022-01-23 DIAGNOSIS — R531 Weakness: Secondary | ICD-10-CM | POA: Insufficient documentation

## 2022-01-23 DIAGNOSIS — I10 Essential (primary) hypertension: Secondary | ICD-10-CM | POA: Insufficient documentation

## 2022-01-23 DIAGNOSIS — E782 Mixed hyperlipidemia: Secondary | ICD-10-CM | POA: Diagnosis not present

## 2022-01-23 DIAGNOSIS — I25118 Atherosclerotic heart disease of native coronary artery with other forms of angina pectoris: Secondary | ICD-10-CM | POA: Insufficient documentation

## 2022-01-23 LAB — TSH: TSH: 2.037 u[IU]/mL (ref 0.350–4.500)

## 2022-01-23 LAB — BASIC METABOLIC PANEL
Anion gap: 7 (ref 5–15)
BUN: 25 mg/dL — ABNORMAL HIGH (ref 8–23)
CO2: 29 mmol/L (ref 22–32)
Calcium: 9.4 mg/dL (ref 8.9–10.3)
Chloride: 105 mmol/L (ref 98–111)
Creatinine, Ser: 1.05 mg/dL — ABNORMAL HIGH (ref 0.44–1.00)
GFR, Estimated: 51 mL/min — ABNORMAL LOW (ref >60)
Glucose, Bld: 139 mg/dL — ABNORMAL HIGH (ref 70–99)
Potassium: 3.9 mmol/L (ref 3.5–5.1)
Sodium: 141 mmol/L (ref 135–145)

## 2022-01-23 LAB — CBC
HCT: 38.3 % (ref 36.0–46.0)
Hemoglobin: 12.8 g/dL (ref 12.0–15.0)
MCH: 30.8 pg (ref 26.0–34.0)
MCHC: 33.4 g/dL (ref 30.0–36.0)
MCV: 92.1 fL (ref 80.0–100.0)
Platelets: 164 10*3/uL (ref 150–400)
RBC: 4.16 MIL/uL (ref 3.87–5.11)
RDW: 13.1 % (ref 11.5–15.5)
WBC: 5.4 10*3/uL (ref 4.0–10.5)
nRBC: 0 % (ref 0.0–0.2)

## 2022-01-23 NOTE — Patient Instructions (Addendum)
Medication Instructions:  - Your physician has recommended you make the following change in your medication:   1) DECREASE Coreg (carvedilol) 12.5 mg - take 0.5 tablet (6.25 mg) by mouth TWICE daily   *If you need a refill on your cardiac medications before your next appointment, please call your pharmacy*   Lab Work: - Your physician recommends that you have lab work today:  CBC/ BMP/ TSH  [facility] Mall Entrance at [facility] 1st desk on the right to check in (REGISTRATION)  Lab hours: Monday- Friday (7:30 am- 5:30 pm)   If you have labs (blood work) drawn today and your tests are completely normal, you will receive your results only by: MyChart Message (if you have MyChart) OR A paper copy in the mail If you have any lab test that is abnormal or we need to change your treatment, we will call you to review the results.   Testing/Procedures: - none ordered   Follow-Up: At [facility], you and your health needs are our priority.  As part of our continuing mission to provide you with exceptional heart care, we have created designated Provider Care Teams.  These Care Teams include your primary Cardiologist (physician) and Advanced Practice Providers (APPs -  Physician Assistants and Nurse Practitioners) who all work together to provide you with the care you need, when you need it.  We recommend signing up for the patient portal called "MyChart".  Sign up information is provided on this After Visit Summary.  MyChart is used to connect with patients for Virtual Visits (Telemedicine).  Patients are able to view lab/test results, encounter notes, upcoming appointments, etc.  Non-urgent messages can be sent to your provider as well.   To learn more about what you can do with MyChart, go to [email or web address].    Your next appointment:   1-2 month(s)  Provider:   You may see Ida Rogue, MD or one of the following Advanced Practice Providers on your designated Care Team:     Cadence Kathlen Mody, [place]     Other Instructions N/a

## 2022-01-23 NOTE — Progress Notes (Unsigned)
Cardiology Office Note:    Date:  01/23/2022   ID:  [name], DOB 05-13-1930, MRN [id number]  PCP:  Lajean Manes, MD  [facility] Cardiologist:  Ida Rogue, MD  [place] Electrophysiologist:  None   Referring MD: Lajean Manes, MD   Chief Complaint: 3 month follow-up  History of Present Illness:    [name] is a 89 y.o. female with a hx of coronary artery disease status post CABG x3, hypertension, hyperlipidemia, anxiety, bilateral carotid artery disease, history of TIA who presents for hospital follow-up.    She had previous PCI and underwent CABG x3 in 12/2016 after cardiac cath showed severe three-vessel disease including 9% distal LMCA stenosis as well as CTO of RCA.  Her CABG occluded LIMA-LAD, SVG-PDA, SVG -OM.  Echocardiogram in 11/2017 revealed LVEF 55-60%, no wall motion abnormalities, G1 DD, moderate MR, mild TR, moderate pulmonary hypertension.  An echo in 11/2020 revealed LVEF 55-60%, no regional wall motion abnormalities, G1 DD, mild MR, and mild to moderate TR.   The patient was Admitted in September 2023 with chest discomfort. HS trop was negative x 2. Imdur was started. BNP was 399 and was given IV lasix. Toprol was increase for bigeminy.  Last seen 10/17/21 and reported chest cramping, mostly atypical. No further work-up was pursued.   Today,  the patient started feeling weak about a month ago. It is hard to get up and around the house. Some days are better than others. She has some shortness of breath when she walks. Very brief chest pain. She has generalized leg weakness. Outside she uses a cane or walker. Some lightheadedness. No falls. Has some lower leg edema that is unchanged. She is eating and drinking normally. No recent illness. PCP change metoprolol to Coreg. Pulse previously 65 bpm  Past Medical History:  Diagnosis Date   Anxiety    Atypical carcinoid lung tumor ([place])    CAD (coronary artery disease)    a. 1986: s/p PTCA of bifurcation  lesion in LAD;  b. 2001: s/p BMS to LAD & LCX;  c. 04/2006 Cath: diff CAD w/o critical narrowing, nl EF;  d. 12/2014 MV: inflat ST dep, mild basal inf ischemia, EF 55-65%-->Low risk; e. 12/2016 - 3V CABG.   Cancer [facility]) yrs ago her pt   right lobe of lung. surgery only.    Carotid disease, bilateral ([place])    a. 08/2015 Carotid U/S: < 39% bilat ICA stenosis.   Cataracts, bilateral    Cholangitis    Choledocholithiasis    Cholelithiases    Chronic diarrhea    Depression    Frequent PVCs    Gilbert's syndrome    Headache    PMH: migraines   History of echocardiogram    a. 06/2010 Echo: EF 60-65%, mild AI/MR; b. 10/2015 Echo: EF 60-65%, no rwma, mild MR, nl RV fxn.   HTN (hypertension)    Hyperlipidemia    Impaired fasting blood sugar    Keloid    mid sternum   Macular degeneration    Osteoarthritis    Pneumonia    PONV (postoperative nausea and vomiting)    Positional vertigo    benign   Postoperative atrial fibrillation ([place]) 12/2016   S/P CABG x 3 12/23/2016   LIMA to LAD, SVG to OM, SVG to PDA, EVH via left thigh   TIA (transient ischemic attack)    Wears glasses    Wears hearing aid     Past Surgical History:  Procedure Laterality Date   ABDOMINAL AORTOGRAM N/A 12/22/2016   Procedure: ABDOMINAL AORTOGRAM;  Surgeon: Nelva Bush, MD;  Location: [place] [facility];  Service: Cardiovascular;  Laterality: N/A;   ABDOMINAL HYSTERECTOMY     BILIARY STENT PLACEMENT  12/10/2017   Procedure: BILIARY STENT PLACEMENT;  Surgeon: Irving Copas., MD;  Location: [place];  Service: Gastroenterology;;   CHOLECYSTECTOMY     CORONARY ARTERY BYPASS GRAFT N/A 12/23/2016   Procedure: CORONARY ARTERY BYPASS GRAFTING (CABG) TIMES THREE USING LEFT INTERNAL MAMMARY ARTERY AND LEFT SAPHENOUS LEG VEIN HARVESTED ENDOSCOPICALLY,;  Surgeon: Rexene Alberts, MD;  Location: [place];  Service: Open Heart Surgery;  Laterality: N/A;   ENDOSCOPIC RETROGRADE CHOLANGIOPANCREATOGRAPHY (ERCP)  WITH PROPOFOL N/A 02/21/2018   Procedure: ENDOSCOPIC RETROGRADE CHOLANGIOPANCREATOGRAPHY (ERCP) WITH PROPOFOL;  Surgeon: Rush Landmark Telford Nab., MD;  Location: [place];  Service: Gastroenterology;  Laterality: N/A;   ERCP N/A 12/10/2017   Procedure: ENDOSCOPIC RETROGRADE CHOLANGIOPANCREATOGRAPHY (ERCP);  Surgeon: Irving Copas., MD;  Location: [place];  Service: Gastroenterology;  Laterality: N/A;   IABP INSERTION N/A 12/22/2016   Procedure: IABP Insertion;  Surgeon: Nelva Bush, MD;  Location: [place] [facility];  Service: Cardiovascular;  Laterality: N/A;   LEFT HEART CATH AND CORONARY ANGIOGRAPHY N/A 12/22/2016   Procedure: LEFT HEART CATH AND CORONARY ANGIOGRAPHY;  Surgeon: Nelva Bush, MD;  Location: [place] [facility];  Service: Cardiovascular;  Laterality: N/A;   LEFT HEART CATHETERIZATION WITH CORONARY ANGIOGRAM N/A 04/27/2012   Procedure: LEFT HEART CATHETERIZATION WITH CORONARY ANGIOGRAM;  Surgeon: Peter M Martinique, MD;  Location: [facility];  Service: Cardiovascular;  Laterality: N/A;   LOBECTOMY Right    RLL atypical carcinoid tumor - Dr Eugenia Mcalpine  1986   PCI of LAD  2001   with non des lad    pci of mid cfx     with non des and cutting balloon pci of av circumflex   REMOVAL OF STONES  02/21/2018   Procedure: REMOVAL OF STONES;  Surgeon: Irving Copas., MD;  Location: [place];  Service: Gastroenterology;;   Joan Mayans  02/21/2018   Procedure: Joan Mayans;  Surgeon: Irving Copas., MD;  Location: [place];  Service: Gastroenterology;;   Lavell Islam REMOVAL  02/21/2018   Procedure: STENT REMOVAL;  Surgeon: Irving Copas., MD;  Location: [place];  Service: Gastroenterology;;   TEE WITHOUT CARDIOVERSION N/A 12/23/2016   Procedure: TRANSESOPHAGEAL ECHOCARDIOGRAM (TEE);  Surgeon: Rexene Alberts, MD;  Location: [place];  Service: Open Heart Surgery;  Laterality: N/A;    Current Medications: Current Meds   Medication Sig   acetaminophen (TYLENOL) 325 MG tablet Take 2 tablets (650 mg total) by mouth every 6 (six) hours as needed for mild pain or headache.   amLODipine (NORVASC) 5 MG tablet Take 1 tablet (5 mg total) by mouth 2 (two) times daily. 1 tablet in the morning and half tablet at night.   aspirin 81 MG tablet Take 81 mg by mouth daily.   atorvastatin (LIPITOR) 40 MG tablet TAKE 1 TABLET BY MOUTH EVERY DAY   calcium carbonate (TUMS EX) 750 MG chewable tablet Chew 2 tablets by mouth as needed for heartburn.   Calcium Carbonate-Vitamin D (CALCIUM 600 + D PO) Take 1 tablet by mouth daily. 600 mg/ 400 mg   carvedilol (COREG) 12.5 MG tablet Take 0.5 tablet (6.25 mg) by mouth twice daily   estradiol (ESTRACE) 0.5 MG tablet Take 0.5 mg by mouth daily.   famotidine (PEPCID)  20 MG tablet Take 20 mg by mouth 2 (two) times daily.   furosemide (LASIX) 40 MG tablet TAKE 1 TABLET BY MOUTH EVERY DAY   KLOR-CON M20 20 MEQ tablet TAKE 1 TABLET BY MOUTH TWICE A DAY   LORazepam (ATIVAN) 0.5 MG tablet Take 1 tablet (0.5 mg total) by mouth every 8 (eight) hours as needed for anxiety.   Multiple Vitamin (MULTIVITAMIN) tablet Take 1 tablet by mouth daily.   nitroGLYCERIN (NITROSTAT) 0.4 MG SL tablet Place 1 tablet (0.4 mg total) under the tongue every 5 (five) minutes as needed for chest pain.   telmisartan (MICARDIS) 80 MG tablet TAKE 1 TABLET BY MOUTH EVERY DAY     Allergies:   Sertraline hcl, Biaxin [clarithromycin], Celecoxib, Ciprofloxacin, Codeine, Ferrous sulfate, Hydromorphone, Lisinopril, Macrobid [nitrofurantoin monohyd macro], Oxycodone hcl, Prednisone, Sertraline hcl, and Penicillins   Social History   Socioeconomic History   Marital status: Divorced    Spouse name: Not on file   Number of children: Not on file   Years of education: Not on file   Highest education level: Not on file  Occupational History   Occupation: retired  Tobacco Use   Smoking status: Never   Smokeless tobacco: Never   Vaping Use   Vaping Use: Never used  Substance and Sexual Activity   Alcohol use: No   Drug use: No   Sexual activity: Not on file  Other Topics Concern   Not on file  Social History Narrative   Not on file   Social Determinants of Health   Financial Resource Strain: Not on file  Food Insecurity: No Food Insecurity (09/24/2021)   Hunger Vital Sign    Worried About Running Out of Food in the Last Year: Never true    Ran Out of Food in the Last Year: Never true  Transportation Needs: No Transportation Needs (09/24/2021)   PRAPARE - Hydrologist (Medical): No    Lack of Transportation (Non-Medical): No  Physical Activity: Unknown (02/03/2018)   Exercise Vital Sign    Days of Exercise per Week: 0 days    Minutes of Exercise per Session: Not on file  Stress: No Stress Concern Present (02/03/2018)   [place]    Feeling of Stress : Not at all  Social Connections: Unknown (02/03/2018)   Social Connection and Isolation Panel [NHANES]    Frequency of Communication with Friends and Family: Patient refused    Frequency of Social Gatherings with Friends and Family: Patient refused    Attends Religious Services: Patient refused    Marine scientist or Organizations: Patient refused    Attends Music therapist: Patient refused    Marital Status: Patient refused     Family History: The patient's family history includes CAD in her father and mother; Diabetes Mellitus I in her brother, mother, sister, and sister. There is no history of Colon cancer, Esophageal cancer, Inflammatory bowel disease, Liver disease, Pancreatic cancer, Rectal cancer, or Stomach cancer.  ROS:   Please see the history of present illness.     All other systems reviewed and are negative.  EKGs/Labs/Other Studies Reviewed:    The following studies were reviewed today:  Echo 09/2021 1. Left ventricular  ejection fraction, by estimation, is 60 to 65%. The  left ventricle has normal function. The left ventricle has no regional  wall motion abnormalities. There is mild left ventricular hypertrophy.  Left ventricular diastolic  parameters  are consistent with Grade I diastolic dysfunction (impaired relaxation).   2. Right ventricular systolic function is normal. The right ventricular  size is normal. There is normal pulmonary artery systolic pressure. The  estimated right ventricular systolic pressure is 95.6 mmHg.   3. The mitral valve is normal in structure. Moderate mitral valve  regurgitation. No evidence of mitral stenosis.   4. Tricuspid valve regurgitation is moderate.   5. The aortic valve has an indeterminant number of cusps. Aortic valve  regurgitation is not visualized. No aortic stenosis is present. Aortic  valve mean gradient measures 7.5 mmHg.   6. The inferior vena cava is normal in size with greater than 50%  respiratory variability, suggesting right atrial pressure of 3 mmHg.   Echo 11/2020 1. Challenging images   2. Left ventricular ejection fraction, by estimation, is 55 to 60%. The  left ventricle has normal function. The left ventricle has no regional  wall motion abnormalities. Left ventricular diastolic parameters are  consistent with Grade I diastolic  dysfunction (impaired relaxation).   3. Right ventricular systolic function is normal. The right ventricular  size is normal. There is normal pulmonary artery systolic pressure. The  estimated right ventricular systolic pressure is 38.7 mmHg.   4. The mitral valve is normal in structure. Mild mitral valve  regurgitation. No evidence of mitral stenosis.   5. Tricuspid valve regurgitation is mild to moderate.   6. The aortic valve was not well visualized. There is moderate  calcification of the aortic valve. Aortic valve regurgitation is not  visualized. Mild to moderate aortic valve sclerosis/calcification is   present, without any evidence of aortic stenosis.   7. The inferior vena cava is normal in size with greater than 50%  respiratory variability, suggesting right atrial pressure of 3 mmHg.   Comparison(s): EF 55%.   LHC 2018 Conclusions: Severe, three vessel coronary artery disease, including 90% distal LMCA stenosis involving the ostia of the LAD and LCx, as well as chronic total occlusion of the RCA. Distal RCA fills via left-to-right collaterals. Mildly elevated left ventricular filling pressure. Abdominal aortic atherosclerosis and calcification without significant stenosis and aneurysm. Successful placement of 40 mL intraaortic balloon pump via the right common femoral artery.   Recommendations: Transfer to [facility] for cardiac surgery consultation. Though the patient is DNR, she was active until her chest pain began ~6 months ago. She is willing to rescind the DNR order for procedures. Surgical revascularization is preferable over PCI, as PCI would require Impella support and atherectomy with significant risk of morbidity and mortality. Initiate heparin infusion (no bolus) 2 hours after removal of right radial artery sheath. Discontinue clopidogrel. Last dose was given this morning (12/22/16). NTG infusion titrated to relief of chest pain. Increase statin therapy. Obtain echo today for evaluation of LV function.   Nelva Bush, MD [facility] Pager: [phone]  EKG:  EKG is  ordered today.  The ekg ordered today demonstrates NSR 65bpm, nonspecific ST/T wave changs  Recent Labs: 09/24/2021: ALT 23; B Natriuretic Peptide 399.2; Magnesium 1.9 01/23/2022: BUN 25; Creatinine, Ser 1.05; Hemoglobin 12.8; Platelets 164; Potassium 3.9; Sodium 141; TSH 2.037  Recent Lipid Panel    Component Value Date/Time   CHOL 121 02/04/2018 0537   CHOL 136 11/04/2013 0541   TRIG 167 (H) 02/04/2018 0537   TRIG 329 (H) 11/04/2013 0541   HDL 47 02/04/2018 0537   HDL 56 11/04/2013 0541    CHOLHDL 2.6 02/04/2018 0537  VLDL 33 02/04/2018 0537   VLDL 66 (H) 11/04/2013 0541   LDLCALC 41 02/04/2018 0537   LDLCALC 14 11/04/2013 0541    Physical Exam:    VS:  BP 134/70 (BP Location: Left Arm, Patient Position: Sitting, Cuff Size: Large)   Pulse 65   Ht 5' 6.5" (1.689 m)   Wt 200 lb (90.7 kg)   SpO2 98%   BMI 31.80 kg/m     Wt Readings from Last 3 Encounters:  01/23/22 200 lb (90.7 kg)  10/17/21 201 lb 9.6 oz (91.4 kg)  09/24/21 200 lb (90.7 kg)     GEN:  Well nourished, well developed in no acute distress HEENT: Normal NECK: No JVD; No carotid bruits LYMPHATICS: No lymphadenopathy CARDIAC: RRR, no murmurs, rubs, gallops RESPIRATORY:  Clear to auscultation without rales, wheezing or rhonchi  ABDOMEN: Soft, non-tender, non-distended MUSCULOSKELETAL:  No edema; No deformity  SKIN: Warm and dry NEUROLOGIC:  Alert and oriented x 3 PSYCHIATRIC:  Normal affect   ASSESSMENT:    1. Generalized weakness   2. Coronary artery disease involving native coronary artery of native heart with other form of angina pectoris ([place])   3. Essential hypertension   4. Mixed hyperlipidemia   5. Chronic diastolic heart failure (HCC)    PLAN:    In order of problems listed above:  Generalized weakness Patient reports 1 month of weakness. She has some shortness of breath and brief sharp chest pains on exertion and at night. 1-2 months ago PCP changed lopressor to Coreg for better BP control. BP and HR are good today. EKG shows NSR with a heart rate of 65bpm. She denies recent illness. I will decrease Coreg to 6.'25mg'$  BID to see if symptoms improves. I will also check CBC, BMET and TSH. I recommended further PCP visit if symptoms continue.   CAD s/p CABG in 2018 and previous PCI/DES Patient with the above complaints. Chest pain sounds more atypical in nature. Echo in 09/2021 showed LVEF 60-65%, no WMA, G1DD, normal RVSF, moderate MR, moderate TR. Plan as above. Continue Aspirin '81mg'$   daily, amlodipine '5mg'$  daily, Lipitor '40mg'$  daily and SLNTG. If patient continues to have DOE and chest pain can consider repeat echo and/or myoview lexiscan.   Chronic diastolic dysfunction Mod MR/TR Most recent echo 09/2021 showed LVEF 60-65%, no WMA, mild LVH, G1DD, moderate MR and moderate TR. Patient has unchanged lower leg edema, taking lasix 40 mg daily. Continue Coreg and ARB. If symptoms continue may consider repeat echo as above.   HTN PCP changed metoprolol to Coreg for better BP control. Given reduction in Coreg, may need additional BP medication. Continue Telmisartan '80mg'$  daily.   Disposition: Follow up in 1-2 month(s) with MD/APP     Signed, Shandreka Dante Ninfa Meeker, PA-C  01/23/2022 4:16 PM    [facility]

## 2022-02-03 ENCOUNTER — Encounter: Payer: Self-pay | Admitting: Cardiovascular Disease

## 2022-02-11 ENCOUNTER — Other Ambulatory Visit: Payer: Self-pay | Admitting: Cardiovascular Disease

## 2022-02-17 ENCOUNTER — Other Ambulatory Visit: Payer: Self-pay

## 2022-02-17 ENCOUNTER — Observation Stay (HOSPITAL_COMMUNITY)
Admit: 2022-02-17 | Discharge: 2022-02-17 | Disposition: A | Discharge status: Home / Self Care | Payer: Medicare Other | Attending: Internal Medicine | Admitting: Internal Medicine

## 2022-02-17 ENCOUNTER — Encounter: Payer: Self-pay | Admitting: Internal Medicine

## 2022-02-17 ENCOUNTER — Emergency Department: Payer: Medicare Other

## 2022-02-17 ENCOUNTER — Inpatient Hospital Stay
Admission: EM | Admit: 2022-02-17 | Discharge: 2022-02-20 | DRG: 445 | Disposition: A | Discharge status: Home Health | Payer: Medicare Other | Attending: Student | Admitting: Student

## 2022-02-17 DIAGNOSIS — R0789 Other chest pain: Secondary | ICD-10-CM | POA: Diagnosis not present

## 2022-02-17 DIAGNOSIS — R000 Tachycardia, unspecified: Secondary | ICD-10-CM | POA: Diagnosis not present

## 2022-02-17 DIAGNOSIS — Z974 Presence of external hearing-aid: Secondary | ICD-10-CM

## 2022-02-17 DIAGNOSIS — I5032 Chronic diastolic (congestive) heart failure: Secondary | ICD-10-CM | POA: Diagnosis not present

## 2022-02-17 DIAGNOSIS — M79662 Pain in left lower leg: Secondary | ICD-10-CM | POA: Diagnosis not present

## 2022-02-17 DIAGNOSIS — Z85118 Personal history of other malignant neoplasm of bronchus and lung: Secondary | ICD-10-CM

## 2022-02-17 DIAGNOSIS — I25119 Atherosclerotic heart disease of native coronary artery with unspecified angina pectoris: Secondary | ICD-10-CM

## 2022-02-17 DIAGNOSIS — R1013 Epigastric pain: Secondary | ICD-10-CM | POA: Diagnosis not present

## 2022-02-17 DIAGNOSIS — K8050 Calculus of bile duct without cholangitis or cholecystitis without obstruction: Secondary | ICD-10-CM | POA: Diagnosis not present

## 2022-02-17 DIAGNOSIS — R079 Chest pain, unspecified: Secondary | ICD-10-CM

## 2022-02-17 DIAGNOSIS — I493 Ventricular premature depolarization: Secondary | ICD-10-CM | POA: Diagnosis present

## 2022-02-17 DIAGNOSIS — K311 Adult hypertrophic pyloric stenosis: Secondary | ICD-10-CM | POA: Diagnosis not present

## 2022-02-17 DIAGNOSIS — I340 Nonrheumatic mitral (valve) insufficiency: Secondary | ICD-10-CM | POA: Diagnosis not present

## 2022-02-17 DIAGNOSIS — Z888 Allergy status to other drugs, medicaments and biological substances status: Secondary | ICD-10-CM

## 2022-02-17 DIAGNOSIS — F32A Depression, unspecified: Secondary | ICD-10-CM | POA: Diagnosis present

## 2022-02-17 DIAGNOSIS — I10 Essential (primary) hypertension: Secondary | ICD-10-CM | POA: Diagnosis present

## 2022-02-17 DIAGNOSIS — Z9861 Coronary angioplasty status: Secondary | ICD-10-CM

## 2022-02-17 DIAGNOSIS — Z90710 Acquired absence of both cervix and uterus: Secondary | ICD-10-CM

## 2022-02-17 DIAGNOSIS — R911 Solitary pulmonary nodule: Secondary | ICD-10-CM | POA: Insufficient documentation

## 2022-02-17 DIAGNOSIS — Z9049 Acquired absence of other specified parts of digestive tract: Secondary | ICD-10-CM | POA: Diagnosis not present

## 2022-02-17 DIAGNOSIS — Z880 Allergy status to penicillin: Secondary | ICD-10-CM | POA: Diagnosis not present

## 2022-02-17 DIAGNOSIS — R748 Abnormal levels of other serum enzymes: Secondary | ICD-10-CM | POA: Insufficient documentation

## 2022-02-17 DIAGNOSIS — M79661 Pain in right lower leg: Secondary | ICD-10-CM | POA: Diagnosis not present

## 2022-02-17 DIAGNOSIS — Z66 Do not resuscitate: Secondary | ICD-10-CM | POA: Diagnosis present

## 2022-02-17 DIAGNOSIS — Z6831 Body mass index (BMI) 31.0-31.9, adult: Secondary | ICD-10-CM

## 2022-02-17 DIAGNOSIS — R531 Weakness: Secondary | ICD-10-CM | POA: Diagnosis present

## 2022-02-17 DIAGNOSIS — Z902 Acquired absence of lung [part of]: Secondary | ICD-10-CM

## 2022-02-17 DIAGNOSIS — E669 Obesity, unspecified: Secondary | ICD-10-CM | POA: Diagnosis present

## 2022-02-17 DIAGNOSIS — K219 Gastro-esophageal reflux disease without esophagitis: Secondary | ICD-10-CM | POA: Diagnosis present

## 2022-02-17 DIAGNOSIS — R6339 Other feeding difficulties: Secondary | ICD-10-CM | POA: Diagnosis not present

## 2022-02-17 DIAGNOSIS — R9389 Abnormal findings on diagnostic imaging of other specified body structures: Secondary | ICD-10-CM | POA: Insufficient documentation

## 2022-02-17 DIAGNOSIS — I25118 Atherosclerotic heart disease of native coronary artery with other forms of angina pectoris: Secondary | ICD-10-CM | POA: Diagnosis not present

## 2022-02-17 DIAGNOSIS — R262 Difficulty in walking, not elsewhere classified: Secondary | ICD-10-CM | POA: Diagnosis present

## 2022-02-17 DIAGNOSIS — I110 Hypertensive heart disease with heart failure: Secondary | ICD-10-CM | POA: Diagnosis present

## 2022-02-17 DIAGNOSIS — I2510 Atherosclerotic heart disease of native coronary artery without angina pectoris: Secondary | ICD-10-CM | POA: Diagnosis not present

## 2022-02-17 DIAGNOSIS — Z9889 Other specified postprocedural states: Secondary | ICD-10-CM

## 2022-02-17 DIAGNOSIS — E876 Hypokalemia: Secondary | ICD-10-CM | POA: Diagnosis present

## 2022-02-17 DIAGNOSIS — R5381 Other malaise: Secondary | ICD-10-CM | POA: Diagnosis not present

## 2022-02-17 DIAGNOSIS — R1010 Upper abdominal pain, unspecified: Secondary | ICD-10-CM

## 2022-02-17 DIAGNOSIS — Z833 Family history of diabetes mellitus: Secondary | ICD-10-CM

## 2022-02-17 DIAGNOSIS — E785 Hyperlipidemia, unspecified: Secondary | ICD-10-CM | POA: Diagnosis not present

## 2022-02-17 DIAGNOSIS — Z7982 Long term (current) use of aspirin: Secondary | ICD-10-CM

## 2022-02-17 DIAGNOSIS — R945 Abnormal results of liver function studies: Secondary | ICD-10-CM | POA: Diagnosis not present

## 2022-02-17 DIAGNOSIS — K838 Other specified diseases of biliary tract: Secondary | ICD-10-CM | POA: Diagnosis not present

## 2022-02-17 DIAGNOSIS — Z8719 Personal history of other diseases of the digestive system: Secondary | ICD-10-CM

## 2022-02-17 DIAGNOSIS — F419 Anxiety disorder, unspecified: Secondary | ICD-10-CM | POA: Diagnosis not present

## 2022-02-17 DIAGNOSIS — Z885 Allergy status to narcotic agent status: Secondary | ICD-10-CM

## 2022-02-17 DIAGNOSIS — I440 Atrioventricular block, first degree: Secondary | ICD-10-CM | POA: Diagnosis present

## 2022-02-17 DIAGNOSIS — Z8673 Personal history of transient ischemic attack (TIA), and cerebral infarction without residual deficits: Secondary | ICD-10-CM

## 2022-02-17 DIAGNOSIS — Z951 Presence of aortocoronary bypass graft: Secondary | ICD-10-CM | POA: Diagnosis not present

## 2022-02-17 DIAGNOSIS — M549 Dorsalgia, unspecified: Secondary | ICD-10-CM | POA: Diagnosis not present

## 2022-02-17 DIAGNOSIS — Z8249 Family history of ischemic heart disease and other diseases of the circulatory system: Secondary | ICD-10-CM

## 2022-02-17 DIAGNOSIS — Z973 Presence of spectacles and contact lenses: Secondary | ICD-10-CM

## 2022-02-17 DIAGNOSIS — I6523 Occlusion and stenosis of bilateral carotid arteries: Secondary | ICD-10-CM | POA: Diagnosis present

## 2022-02-17 DIAGNOSIS — I252 Old myocardial infarction: Secondary | ICD-10-CM

## 2022-02-17 DIAGNOSIS — M7989 Other specified soft tissue disorders: Secondary | ICD-10-CM | POA: Diagnosis not present

## 2022-02-17 DIAGNOSIS — Z79899 Other long term (current) drug therapy: Secondary | ICD-10-CM | POA: Diagnosis not present

## 2022-02-17 DIAGNOSIS — R109 Unspecified abdominal pain: Secondary | ICD-10-CM | POA: Diagnosis not present

## 2022-02-17 LAB — BASIC METABOLIC PANEL
Anion gap: 9 (ref 5–15)
BUN: 22 mg/dL (ref 8–23)
CO2: 27 mmol/L (ref 22–32)
Calcium: 9.4 mg/dL (ref 8.9–10.3)
Chloride: 102 mmol/L (ref 98–111)
Creatinine, Ser: 1.02 mg/dL — ABNORMAL HIGH (ref 0.44–1.00)
GFR, Estimated: 53 mL/min — ABNORMAL LOW (ref >60)
Glucose, Bld: 179 mg/dL — ABNORMAL HIGH (ref 70–99)
Potassium: 3.8 mmol/L (ref 3.5–5.1)
Sodium: 138 mmol/L (ref 135–145)

## 2022-02-17 LAB — HEPATIC FUNCTION PANEL
ALT: 53 U/L — ABNORMAL HIGH (ref 0–44)
AST: 85 U/L — ABNORMAL HIGH (ref 15–41)
Albumin: 4.2 g/dL (ref 3.5–5.0)
Alkaline Phosphatase: 111 U/L (ref 38–126)
Bilirubin, Direct: 0.3 mg/dL — ABNORMAL HIGH (ref 0.0–0.2)
Indirect Bilirubin: 1.5 mg/dL — ABNORMAL HIGH (ref 0.3–0.9)
Total Bilirubin: 1.8 mg/dL — ABNORMAL HIGH (ref 0.3–1.2)
Total Protein: 7.6 g/dL (ref 6.5–8.1)

## 2022-02-17 LAB — CBC
HCT: 40.2 % (ref 36.0–46.0)
Hemoglobin: 13.3 g/dL (ref 12.0–15.0)
MCH: 30.5 pg (ref 26.0–34.0)
MCHC: 33.1 g/dL (ref 30.0–36.0)
MCV: 92.2 fL (ref 80.0–100.0)
Platelets: 174 10*3/uL (ref 150–400)
RBC: 4.36 MIL/uL (ref 3.87–5.11)
RDW: 12.4 % (ref 11.5–15.5)
WBC: 5.2 10*3/uL (ref 4.0–10.5)
nRBC: 0 % (ref 0.0–0.2)

## 2022-02-17 LAB — LIPASE, BLOOD: Lipase: 42 U/L (ref 11–51)

## 2022-02-17 LAB — TROPONIN I (HIGH SENSITIVITY)
Troponin I (High Sensitivity): 12 ng/L (ref <18)
Troponin I (High Sensitivity): 15 ng/L (ref <18)

## 2022-02-17 MED ORDER — ACETAMINOPHEN 650 MG RE SUPP: 650.0000 mg | Freq: Four times a day (QID) | RECTAL | Status: DC | PRN

## 2022-02-17 MED ORDER — HYDRALAZINE HCL 20 MG/ML IJ SOLN: 5.0000 mg | Freq: Three times a day (TID) | INTRAMUSCULAR | Status: DC | PRN

## 2022-02-17 MED ORDER — METOPROLOL TARTRATE 5 MG/5ML IV SOLN: 2.5000 mg | INTRAVENOUS | Status: DC | PRN

## 2022-02-17 MED ORDER — CARVEDILOL 3.125 MG PO TABS
3.1250 mg | ORAL_TABLET | Freq: Two times a day (BID) | ORAL | Status: DC
  Administered 2022-02-18: 3.125 mg via ORAL
  Filled 2022-02-17: qty 1

## 2022-02-17 MED ORDER — LORAZEPAM 0.5 MG PO TABS
0.5000 mg | ORAL_TABLET | Freq: Four times a day (QID) | ORAL | Status: AC | PRN
  Administered 2022-02-17 – 2022-02-18 (×2): 0.5 mg via ORAL
  Filled 2022-02-17 (×2): qty 1

## 2022-02-17 MED ORDER — ASPIRIN 81 MG PO TBEC: 81.0000 mg | DELAYED_RELEASE_TABLET | Freq: Every day | ORAL | Status: DC

## 2022-02-17 MED ORDER — MORPHINE SULFATE (PF) 2 MG/ML IV SOLN: 2.0000 mg | INTRAVENOUS | Status: DC | PRN

## 2022-02-17 MED ORDER — CARVEDILOL 6.25 MG PO TABS: 12.5000 mg | ORAL_TABLET | Freq: Two times a day (BID) | ORAL | Status: DC

## 2022-02-17 MED ORDER — ATORVASTATIN CALCIUM 20 MG PO TABS: 40.0000 mg | ORAL_TABLET | Freq: Every day | ORAL | Status: DC

## 2022-02-17 MED ORDER — ONDANSETRON HCL 4 MG/2ML IJ SOLN
4.0000 mg | Freq: Four times a day (QID) | INTRAMUSCULAR | Status: DC | PRN
  Administered 2022-02-19: 4 mg via INTRAVENOUS
  Filled 2022-02-17: qty 2

## 2022-02-17 MED ORDER — METOPROLOL TARTRATE 5 MG/5ML IV SOLN: 5.0000 mg | INTRAVENOUS | Status: DC | PRN

## 2022-02-17 MED ORDER — IOHEXOL 350 MG/ML SOLN
100.0000 mL | Freq: Once | INTRAVENOUS | Status: AC | PRN
  Administered 2022-02-17: 100 mL via INTRAVENOUS

## 2022-02-17 MED ORDER — ACETAMINOPHEN 325 MG PO TABS: 650.0000 mg | ORAL_TABLET | ORAL | Status: DC | PRN

## 2022-02-17 MED ORDER — ACETAMINOPHEN 325 MG PO TABS
650.0000 mg | ORAL_TABLET | Freq: Four times a day (QID) | ORAL | Status: DC | PRN
  Administered 2022-02-17 – 2022-02-20 (×4): 650 mg via ORAL
  Filled 2022-02-17 (×4): qty 2

## 2022-02-17 MED ORDER — ADULT MULTIVITAMIN W/MINERALS CH
1.0000 | ORAL_TABLET | Freq: Every day | ORAL | Status: DC
  Administered 2022-02-18 – 2022-02-20 (×3): 1 via ORAL
  Filled 2022-02-17 (×3): qty 1

## 2022-02-17 MED ORDER — CALCIUM CARBONATE ANTACID 500 MG PO CHEW: 2.0000 | CHEWABLE_TABLET | ORAL | Status: DC | PRN

## 2022-02-17 MED ORDER — HEPARIN SODIUM (PORCINE) 5000 UNIT/ML IJ SOLN
5000.0000 [IU] | Freq: Three times a day (TID) | INTRAMUSCULAR | Status: DC
  Administered 2022-02-17 – 2022-02-18 (×3): 5000 [IU] via SUBCUTANEOUS
  Filled 2022-02-17 (×4): qty 1

## 2022-02-17 MED ORDER — LACTATED RINGERS IV SOLN: INTRAVENOUS | Status: AC

## 2022-02-17 MED ORDER — FAMOTIDINE 20 MG PO TABS
20.0000 mg | ORAL_TABLET | Freq: Every day | ORAL | Status: DC
  Administered 2022-02-18 – 2022-02-20 (×3): 20 mg via ORAL
  Filled 2022-02-17 (×3): qty 1

## 2022-02-17 MED ORDER — NITROGLYCERIN 2 % TD OINT: 1.0000 [in_us] | TOPICAL_OINTMENT | Freq: Four times a day (QID) | TRANSDERMAL | Status: DC | PRN

## 2022-02-17 NOTE — H&P (Addendum)
History and Physical   [name] NID:[id number] DOB: [birth year] DOA: 02/17/2022  PCP: [facility] @ [place]  Outpatient Specialists: Dr. Rockey Situ, [facility] Patient coming from: Home via EMS  I have personally briefly reviewed patient's old medical records in [place].  Chief Concern: Chest pain  HPI: [name] is an 89 year old female with history of hypertension, GERD, hyperlipidemia, CAD status post PCI to mid LCX and LAD and three-vessel CABG in 2018 with LIMA to LAD and vein graft to PDA, vein graft to OM, history of lung cancer status right lower lobectomy, remote history of cholecystectomy 20 years ago, history of cholangitis secondary to CBD stone obstruction status post ERCP in 2019, who presents emergency department for chief concerns of chest pain.  Initial vitals in the ED shows temperature of 97.5, respiration rate of 18, heart rate 93, blood pressure 152/85, SpO2 of 98% on room air.  Serum sodium is 138, potassium 3.4, chloride 102, bicarb 27, BUN of 22, serum creatinine 1.02, EGFR 53, nonfasting blood glucose 179, WBC 5.2, hemoglobin 13.3, platelets of 174.  High sensitive troponin was initially 12 and on repeat was 15.  AST was 85, ALT was 53.  EKG was initially concerning for ST elevation at aVR with ST depression on anterior leads.  ED P discussed EKG with cardiology service, Dr. Saunders Revel and given that patient is not endorsing active chest pain, cardiology will defer emergent Cath Lab.  Cardiology will follow patient.  ED treatment: None --------------------------- At bedside, she is able to tell me her name, age, current calendar year, and current location.  She reports that at approximately 11 am, she developed epigastric pain, that she describes as sharp, 20/10, persistent, and lasting approxiamtely 31mn-1 hr. She denies trauma to her person. She reprots the pain just went away on its own. She reports she had breakfast at  approximately 7 am, and it was a poptart.   She endorses associated with shortness of breath. She endorses worsening swellling of her legs and swelling.   She denies expereincing this epigastric pain before. She deneis nausea, vomiting, abdominal pain, fever, coguh, dysuria, diarrhea.   Of note, she was experiencing increased weakness, difficutly walking, and family took patient off her carvedilol.   She reports her last BM was last evening, and it was ligtht brown.   Social history: She lives with her son and his wife and their son. She denies tobacco, etoh, recreational drug use. She is retired and formerly worked at a tobacco use.   ROS: Constitutional: no weight change, no fever ENT/Mouth: no sore throat, no rhinorrhea Eyes: no eye pain, no vision changes Cardiovascular: no chest pain, no dyspnea,  no edema, no palpitations Respiratory: no cough, no sputum, no wheezing Gastrointestinal: no nausea, no vomiting, no diarrhea, no constipation Genitourinary: no urinary incontinence, no dysuria, no hematuria Musculoskeletal: no arthralgias, no myalgias Skin: no skin lesions, no pruritus, Neuro: + weakness, no loss of consciousness, no syncope Psych: no anxiety, no depression, no decrease appetite Heme/Lymph: no bruising, no bleeding  ED Course: Discussed with emergency medicine provider, patient requiring hospitalization for chief concerns of chest pain.  Assessment/Plan  Principal Problem:   Epigastric abdominal pain Active Problems:   Essential hypertension   Anxiety   Obesity (BMI 30-39.9)   Coronary atherosclerosis   Mitral regurgitation   Coronary artery disease involving native coronary artery of native heart without angina pectoris   History of ERCP   History of biliary stent insertion  Physical deconditioning   Elevated liver enzymes   Hyperlipidemia   History of cholangitis   Assessment and Plan:  * Epigastric abdominal pain Versus chest pain - In the ED,  with initial concerning EKG showing aVR ST elevation and leads II, 3, V3 depression - EDP consulted [facility] cardiology who recommends against left heart cath given the patient's troponin were negative and her chest pain has resolved - Dr. Saunders Revel recommends GI consult, echo, and possible stress test should her symptoms persist post GI evaluation -The pain is consistent with epigastric and or atypical in nature, low clinical suspicion for ACS given negative troponins - Right upper quadrant abdominal ultrasound ordered, if unremarkable would would recommend a.m. team to consider MRCP as patient has a history of acute cholangitis due to CBD stone and obstruction requiring ERCP in November 2019 - Gastroenterology service consultation placed via staff message in Epic order to Dr. Haig Prophet - Symptomatic support: Morphine 2 mg IV every 4 hours as needed for severe pain, 4 doses ordered - Admit to telemetry cardiac, observation  Essential hypertension - Resumed home carvedilol 6.25 mg p.o. twice daily with meals - Hydralazine 5 mg IV every 8 hours as needed for SBP greater than 175, 4 days ordered  Anxiety - Ativan 0.5 mg p.o. every 6 hours as needed for anxiety, 2 doses ordered  Obesity (BMI 27-25.3) - This complicates overall care and prognosis.   History of cholangitis With remote history of cholecystectomy - Status post ERCP for CBD stone removal in 12/10/2017 - Currently reassuring with no leukocytosis and/or fever - Recheck LFTs in a.m.  Hyperlipidemia - Home atorvastatin 40 mg nightly not resumed due to elevated LFT  Elevated liver enzymes - Right upper quadrant abdominal ultrasound ordered - Repeat LFTs in the a.m.  Coronary artery disease involving native coronary artery of native heart without angina pectoris - Aspirin 81 mg daily - Home atorvastatin not resumed on admission due to elevated LFTs  Coronary atherosclerosis - Aspirin 81 mg daily not resumed on admission due to possible  GI procedure  Chart reviewed.   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 1986: s/p PTCA of bifurcation lesion in LAD;  b. 2001: s/p BMS to LAD & LCX;  c. 04/2006 Cath: diff CAD w/o critical narrowing, nl EF;  d. 12/2014 MV: inflat ST dep, mild basal inf ischemia, EF 55-65%-->Low risk; e. 12/2016 - 3V CABG.   Cancer [facility]) yrs ago her pt   right lobe of lung. surgery only.    Carotid disease, bilateral ([place])    a. 08/2015 Carotid U/S: < 39% bilat ICA stenosis.   Cataracts, bilateral    Cholangitis    Choledocholithiasis    Cholelithiases    Chronic diarrhea    Depression    Frequent PVCs    Gilbert's syndrome    Headache    PMH: migraines   History of echocardiogram    a. 06/2010 Echo: EF 60-65%, mild AI/MR; b. 10/2015 Echo: EF 60-65%, no rwma, mild MR, nl RV fxn.   HTN (hypertension)    Hyperlipidemia    Impaired fasting blood sugar  Keloid    mid sternum   Macular degeneration    Osteoarthritis    Pneumonia    PONV (postoperative nausea and vomiting)    Positional vertigo    benign   Postoperative atrial fibrillation ([place]) 12/2016   S/P CABG x 3 12/23/2016   LIMA to LAD, SVG to OM, SVG to PDA, EVH via left thigh   TIA (transient ischemic attack)    Wears glasses    Wears hearing aid    Past Surgical History:  Procedure Laterality Date   ABDOMINAL AORTOGRAM N/A 12/22/2016   Procedure: ABDOMINAL AORTOGRAM;  Surgeon: Nelva Bush, MD;  Location: [facility];  Service: Cardiovascular;  Laterality: N/A;   ABDOMINAL HYSTERECTOMY      BILIARY STENT PLACEMENT  12/10/2017   Procedure: BILIARY STENT PLACEMENT;  Surgeon: Irving Copas., MD;  Location: [place];  Service: Gastroenterology;;   CHOLECYSTECTOMY     CORONARY ARTERY BYPASS GRAFT N/A 12/23/2016   Procedure: CORONARY ARTERY BYPASS GRAFTING (CABG) TIMES THREE USING LEFT INTERNAL MAMMARY ARTERY AND LEFT SAPHENOUS LEG VEIN HARVESTED ENDOSCOPICALLY,;  Surgeon: Rexene Alberts, MD;  Location: [place];  Service: Open Heart Surgery;  Laterality: N/A;   ENDOSCOPIC RETROGRADE CHOLANGIOPANCREATOGRAPHY (ERCP) WITH PROPOFOL N/A 02/21/2018   Procedure: ENDOSCOPIC RETROGRADE CHOLANGIOPANCREATOGRAPHY (ERCP) WITH PROPOFOL;  Surgeon: Rush Landmark Telford Nab., MD;  Location: [place];  Service: Gastroenterology;  Laterality: N/A;   ERCP N/A 12/10/2017   Procedure: ENDOSCOPIC RETROGRADE CHOLANGIOPANCREATOGRAPHY (ERCP);  Surgeon: Irving Copas., MD;  Location: [place];  Service: Gastroenterology;  Laterality: N/A;   IABP INSERTION N/A 12/22/2016   Procedure: IABP Insertion;  Surgeon: Nelva Bush, MD;  Location: [place] [facility];  Service: Cardiovascular;  Laterality: N/A;   LEFT HEART CATH AND CORONARY ANGIOGRAPHY N/A 12/22/2016   Procedure: LEFT HEART CATH AND CORONARY ANGIOGRAPHY;  Surgeon: Nelva Bush, MD;  Location: [place] [facility];  Service: Cardiovascular;  Laterality: N/A;   LEFT HEART CATHETERIZATION WITH CORONARY ANGIOGRAM N/A 04/27/2012   Procedure: LEFT HEART CATHETERIZATION WITH CORONARY ANGIOGRAM;  Surgeon: Peter M Martinique, MD;  Location: [facility];  Service: Cardiovascular;  Laterality: N/A;   LOBECTOMY Right    RLL atypical carcinoid tumor - Dr Eugenia Mcalpine  1986   PCI of LAD  2001   with non des lad    pci of mid cfx     with non des and cutting balloon pci of av circumflex   REMOVAL OF STONES  02/21/2018   Procedure: REMOVAL OF STONES;  Surgeon: Irving Copas., MD;  Location: [place];  Service: Gastroenterology;;    Joan Mayans  02/21/2018   Procedure: Joan Mayans;  Surgeon: Irving Copas., MD;  Location: [place];  Service: Gastroenterology;;   Lavell Islam REMOVAL  02/21/2018   Procedure: STENT REMOVAL;  Surgeon: Irving Copas., MD;  Location: [place];  Service: Gastroenterology;;   TEE WITHOUT CARDIOVERSION N/A 12/23/2016   Procedure: TRANSESOPHAGEAL ECHOCARDIOGRAM (TEE);  Surgeon: Rexene Alberts, MD;  Location: [place];  Service: Open Heart Surgery;  Laterality: N/A;   Social History:  reports that she has never smoked. She has never used smokeless tobacco. She reports that she does not drink alcohol and does not use drugs.  Allergies  Allergen Reactions   Sertraline Hcl Nausea Only   Biaxin [Clarithromycin]     depressed   Celecoxib Other (See Comments)    Reaction: Unknown   Ciprofloxacin    Codeine Other (See Comments)    Reaction:  Unknown   Ferrous Sulfate Other (See Comments)   Hydromorphone    Lisinopril     cough   Macrobid [Nitrofurantoin Monohyd Macro] Other (See Comments)    Reaction: Unknown   Oxycodone Hcl Other (See Comments)    Reaction: Unknown   Prednisone    Sertraline Hcl Other (See Comments)    Reaction: Unknown   Penicillins Rash    Reaction: Unknown   Family History  Problem Relation Age of Onset   Diabetes Mellitus I Mother    CAD Mother    CAD Father    Diabetes Mellitus I Sister    Diabetes Mellitus I Sister    Diabetes Mellitus I Brother    Colon cancer Neg Hx    Esophageal cancer Neg Hx    Inflammatory bowel disease Neg Hx    Liver disease Neg Hx    Pancreatic cancer Neg Hx    Rectal cancer Neg Hx    Stomach cancer Neg Hx    Family history: Family history reviewed and not pertinent.  Prior to Admission medications   Medication Sig Start Date End Date Taking? Authorizing Provider  acetaminophen (TYLENOL) 325 MG tablet Take 2 tablets (650 mg total) by mouth every 6 (six) hours as needed for mild pain or headache. 01/02/17    Barrett, Erin R, PA-C  amLODipine (NORVASC) 5 MG tablet Take 1 tablet (5 mg total) by mouth 2 (two) times daily. 1 tablet in the morning and half tablet at night. 10/08/20   Minna Merritts, MD  aspirin 81 MG tablet Take 81 mg by mouth daily.    [provider]  atorvastatin (LIPITOR) 40 MG tablet TAKE 1 TABLET BY MOUTH EVERY DAY 12/23/21   Minna Merritts, MD  calcium carbonate (TUMS EX) 750 MG chewable tablet Chew 2 tablets by mouth as needed for heartburn.    [provider]  Calcium Carbonate-Vitamin D (CALCIUM 600 + D PO) Take 1 tablet by mouth daily. 600 mg/ 400 mg    [provider]  carvedilol (COREG) 12.5 MG tablet Take 0.5 tablet (6.25 mg) by mouth twice daily 12/15/21   [provider]  estradiol (ESTRACE) 0.5 MG tablet Take 0.5 mg by mouth daily. 10/04/19   [provider]  famotidine (PEPCID) 20 MG tablet Take 20 mg by mouth 2 (two) times daily.    [provider]  furosemide (LASIX) 40 MG tablet TAKE 1 TABLET BY MOUTH EVERY DAY 12/23/21   Gollan, Kathlene November, MD  KLOR-CON M20 20 MEQ tablet TAKE 1 TABLET BY MOUTH TWICE A DAY 02/11/22   Kate Sable, MD  LORazepam (ATIVAN) 0.5 MG tablet Take 1 tablet (0.5 mg total) by mouth every 8 (eight) hours as needed for anxiety. 01/02/17   Barrett, Erin R, PA-C  Multiple Vitamin (MULTIVITAMIN) tablet Take 1 tablet by mouth daily.    [provider]  nitroGLYCERIN (NITROSTAT) 0.4 MG SL tablet Place 1 tablet (0.4 mg total) under the tongue every 5 (five) minutes as needed for chest pain. 10/08/20   Minna Merritts, MD  telmisartan (MICARDIS) 80 MG tablet TAKE 1 TABLET BY MOUTH EVERY DAY 12/23/21   Minna Merritts, MD   Physical Exam: Vitals:   02/17/22 1153 02/17/22 1530 02/17/22 1821 02/17/22 2034  BP:  (!) 148/83 (!) 163/90 (!) 172/80  Pulse:  98 100 100  Resp:  '18 20 20  '$ Temp:  (!) 97.5 F (36.4 C) (!) 97.4 F (36.3 C) 98.6 F (37 C)  TempSrc:   Oral   SpO2:  98% 99%  97%  Weight: 90.7 kg     Height: 5' 6.5" (1.689 m)      Constitutional: appears age-appropriate, NAD, calm, comfortable Eyes: PERRL, lids and conjunctivae normal ENMT: Mucous membranes are moist. Posterior pharynx clear of any exudate or lesions. Age-appropriate dentition. Hearing appropriate Neck: normal, supple, no masses, no thyromegaly Respiratory: clear to auscultation bilaterally, no wheezing, no crackles. Normal respiratory effort. No accessory muscle use.  Cardiovascular: Regular rate and rhythm / rubs / gallops. No extremity edema. 2+ pedal pulses. No carotid bruits. + heart murmur over the right sternum.  Abdomen: Obese abdomen, no tenderness, no masses palpated, no hepatosplenomegaly. Bowel sounds positive.  Musculoskeletal: no clubbing / cyanosis. No joint deformity upper and lower extremities. Good ROM, no contractures, no atrophy. Normal muscle tone.  Skin: no rashes, lesions, ulcers. No induration Neurologic: Sensation intact. Strength 5/5 in all 4.  Psychiatric: Normal judgment and insight. Alert and oriented x 3. Normal mood.   EKG: independently reviewed, showing ST elevation at aVR with depression in anterior leads.  Chest x-ray on Admission: I personally reviewed and I agree with radiologist reading as below.  CT Angio Chest PE W/Cm &/Or Wo Cm  Addendum Date: 02/17/2022   ADDENDUM REPORT: 02/17/2022 16:48 ADDENDUM: Imaging findings were discussed with Dr. Quentin Cornwall of the emergency department rather than Dr. Charna Archer as was outlined in the previous addendum. Nodules in the RIGHT lung base while favored to be infectious or inflammatory follow-up imaging is suggested to ensure resolution in this patient with history of lung cancer. This was also relayed via telephone, by me, at the time discussion. Electronically Signed   By: Zetta Bills M.D.   On: 02/17/2022 16:48   Addendum Date: 02/17/2022   ADDENDUM REPORT: 02/17/2022 16:11 ADDENDUM: These results were called by telephone  at the time of interpretation on 02/17/2022 at 4:01 pm to provider [facility] , who verbally acknowledged these results. Electronically Signed   By: Zetta Bills M.D.   On: 02/17/2022 16:11   Result Date: 02/17/2022 CLINICAL DATA:  Abdominal pain.  History of RIGHT lung cancer. * Tracking Code: BO * EXAM: CT ANGIOGRAPHY CHEST WITH CONTRAST TECHNIQUE: Multidetector CT imaging of the chest was performed using the standard protocol during bolus administration of intravenous contrast. Multiplanar CT image reconstructions and MIPs were obtained to evaluate the vascular anatomy. RADIATION DOSE REDUCTION: This exam was performed according to the departmental dose-optimization program which includes automated exposure control, adjustment of the mA and/or kV according to patient size and/or use of iterative reconstruction technique. CONTRAST:  122m OMNIPAQUE IOHEXOL 350 MG/ML SOLN COMPARISON:  November of 2019. FINDINGS: Cardiovascular: Calcified aortic atherosclerotic changes. Signs of median sternotomy. Dense material along the margin of the ascending thoracic aorta near the proximal arch related to dystrophic calcification or pledget material the setting of prior surgery. No signs of aortic dilation. Irregularity of ascending thoracic aorta with subtle suspected focal dissection perhaps related to prior aortic manipulation without substantial change as a subtle abnormality on prior imaging (image 172/4) Heart size mildly enlarged. Three-vessel coronary artery calcification and signs of prior bypass. Study optimized for evaluation of pulmonary vascular bed which is opacified to between 490 and 600 Hounsfield units. Study is negative for pulmonary embolism. Mediastinum/Nodes: Mildly patulous esophagus. No thoracic inlet adenopathy. No axillary lymphadenopathy. No hilar adenopathy. Lungs/Pleura: Signs of pleural thickening along the posteromedial RIGHT chest following RIGHT lower lobectomy and along the anterolateral  RIGHT chest  with mild pleural thickening associated with calcification likely related to postoperative and post treatment changes. Trace pleural effusion at the RIGHT lung base is similar to prior imaging. No pneumothorax. No signs of dense consolidative process. Subtle tree in bud in the posterior RIGHT upper lobe (image 66/3) this is stable compared to previous imaging. Tree-in-bud opacities are also noted at the RIGHT lung base which are new compared to previous imaging in the largest area of nodularity is a discrete nodule along the pleural surface measuring approximately 5 x 7 mm (image 111/3) no additional signs of new nodule. There are other small nodules along the pleural surface of the RIGHT lung. Upper Abdomen: Incidental imaging of upper abdominal contents with signs of pneumobilia. No acute upper abdominal findings. See dedicated abdominal report for further details. Abdominal contents Musculoskeletal: Spinal degenerative changes without acute or destructive bone process. Review of the MIP images confirms the above findings. IMPRESSION: 1. Study negative for pulmonary embolism. 2. This non gated study shows no definitive signs of opacification of the RIGHT coronary graft. Chronicity is uncertain but perhaps there was some contrast in the graft on previous imaging from 2021. Correlate with cardiac enzymes. 3. Signs of pleural thickening along the posteromedial RIGHT chest following RIGHT lower lobectomy and along the anterolateral RIGHT chest with mild pleural thickening associated with calcification likely related to postoperative and post treatment changes. Trace pleural effusion at the RIGHT lung base is similar to prior imaging. 4. Tree-in-bud opacities are noted at the RIGHT lung base which are new compared to previous imaging in the largest area of nodularity is a discrete nodule along the pleural surface measuring approximately 5 x 7 mm. Findings are concerning for infectious or inflammatory  process. 5. Irregularity of ascending thoracic aorta with subtle suspected focal dissection perhaps related to prior aortic manipulation without substantial change as a subtle abnormality on prior imaging. Consider follow-up at 3-6 months to ensure stability but lack of stranding and thickening in this area and presence on prior imaging without change since 2021 6. Three-vessel coronary artery calcification and signs of prior bypass. 7. Aortic atherosclerosis. Aortic Atherosclerosis (ICD10-I70.0). A call is out to the referring provider to further discuss findings in the above case. Electronically Signed: By: Zetta Bills M.D. On: 02/17/2022 15:47   CT Abdomen Pelvis W Contrast  Result Date: 02/17/2022 CLINICAL DATA:  Abdominal pain in a patient with history of lung cancer. Pain is radiating to the chest by report. * Tracking Code: BO * EXAM: CT ABDOMEN AND PELVIS WITH CONTRAST TECHNIQUE: Multidetector CT imaging of the abdomen and pelvis was performed using the standard protocol following bolus administration of intravenous contrast. RADIATION DOSE REDUCTION: This exam was performed according to the departmental dose-optimization program which includes automated exposure control, adjustment of the mA and/or kV according to patient size and/or use of iterative reconstruction technique. CONTRAST:  125m OMNIPAQUE IOHEXOL 350 MG/ML SOLN COMPARISON:  Abdominal imaging from 2019 in prior your CP Z. FINDINGS: Lower chest: Signs of mild cardiac to moderate cardiac enlargement, see dedicated chest CT for further details regarding chest findings. Patchy nodularity at the RIGHT lung base dominant nodule approaching a cm in size. Hepatobiliary: Pneumobilia with mild peripheral peribiliary enhancement near the ampulla. CBD dilation is improved compared to 2019 at 13 mm as compared to 16 mm but there is a potential filling defect in the common bile duct measuring approximately 16 x 8 mm. No focal, suspicious hepatic  lesion. Portal vein is patent. SMV is patent.  Hepatic veins are patent. Pancreas: Mild atrophy of the pancreas without ductal dilation, inflammation or visible lesion. Spleen: Calcifications along the margin of the spleen, no focal lesion in the spleen. Spleen is not enlarged. Adrenals/Urinary Tract: Adrenal glands are normal. Symmetric renal enhancement without hydronephrosis or suspicious renal lesion. No thickening of the urinary bladder. No nephrolithiasis or ureteral calculi on this contrasted exam. Stomach/Bowel: Small hiatal hernia. No stranding adjacent to the stomach. No small bowel obstruction or inflammation. Appendix is not visible though there are no secondary signs that would suggest acute appendicitis. No signs of colonic inflammation. Vascular/Lymphatic: Aortic atherosclerosis. No sign of aneurysm. Smooth contour of the IVC. There is no gastrohepatic or hepatoduodenal ligament lymphadenopathy. No retroperitoneal or mesenteric lymphadenopathy. No pelvic sidewall lymphadenopathy. Reproductive: Post hysterectomy.  No ascites. Other: No ascites. Small nodule in the RIGHT mesorectum is not substantially changed compared to imaging from 2019 (image 77/4) this measures approximately 8 mm and is been present to some extent since 2013 perhaps related to prior inflammation. Musculoskeletal: No acute bone finding. No destructive bone process. Spinal degenerative changes. IMPRESSION: 1. Pneumobilia with mild peripheral peribiliary enhancement near the ampulla. Correlate with any clinical or laboratory evidence of cholangitis. There is also a potential filling defect which may represent tumefactive sludge or a biliary calculus in the mid common bile duct. MRCP could be considered as warranted for further evaluation. Biliary duct dilation is improved compared to more remote imaging from 2019. 2. Patchy nodularity at the RIGHT lung base dominant nodule approaching a cm in size. Correlate with any history of recent  infection or aspiration. Given discrete nodule at the RIGHT lung base would suggest follow-up in 8-12 weeks to exclude persistence in this patient with lung cancer. 3. Small hiatal hernia. 4. Aortic atherosclerosis. Aortic Atherosclerosis (ICD10-I70.0). Electronically Signed   By: Zetta Bills M.D.   On: 02/17/2022 16:00   DG Chest 2 View  Result Date: 02/17/2022 CLINICAL DATA:  cp EXAM: CHEST - 2 VIEW COMPARISON:  09/24/2021. FINDINGS: Cardiac silhouette is unremarkable. No pneumothorax or pleural effusion. The lungs are clear. Aorta ectatic and calcified. The visualized skeletal structures are unremarkable. Patient is status post median sternotomy and CABG. IMPRESSION: No acute cardiopulmonary process. Electronically Signed   By: Sammie Bench M.D.   On: 02/17/2022 12:38    Labs on Admission: I have personally reviewed following labs  CBC: Recent Labs  Lab 02/17/22 1156  WBC 5.2  HGB 13.3  HCT 40.2  MCV 92.2  PLT 354   Basic Metabolic Panel: Recent Labs  Lab 02/17/22 1156  NA 138  K 3.8  CL 102  CO2 27  GLUCOSE 179*  BUN 22  CREATININE 1.02*  CALCIUM 9.4   GFR: Estimated Creatinine Clearance: 42.9 mL/min (A) (by C-G formula based on SCr of 1.02 mg/dL (H)).  Liver Function Tests: Recent Labs  Lab 02/17/22 1425  AST 85*  ALT 53*  ALKPHOS 111  BILITOT 1.8*  PROT 7.6  ALBUMIN 4.2   Recent Labs  Lab 02/17/22 1425  LIPASE 42   Urine analysis:    Component Value Date/Time   COLORURINE STRAW (A) 10/31/2018 1029   APPEARANCEUR CLEAR (A) 10/31/2018 1029   APPEARANCEUR Clear 11/03/2013 1527   LABSPEC 1.003 (L) 10/31/2018 1029   LABSPEC 1.006 11/03/2013 1527   PHURINE 7.0 10/31/2018 [place] 10/31/2018 1029   GLUCOSEU Negative 11/03/2013 1527   GLUCOSEU NEGATIVE 03/30/2011 1306   HGBUR NEGATIVE 10/31/2018 [place]  10/31/2018 1029   BILIRUBINUR Negative 11/03/2013 [place] 10/31/2018 1029   PROTEINUR  NEGATIVE 10/31/2018 1029   UROBILINOGEN 0.2 03/30/2011 1306   NITRITE NEGATIVE 10/31/2018 [place] 10/31/2018 1029   LEUKOCYTESUR Negative 11/03/2013 1527   This document was prepared using Dragon Voice Recognition software and may include unintentional dictation errors.  Dr. Tobie Poet [facility]  If 7PM-7AM, please contact overnight-coverage provider If 7AM-7PM, please contact day coverage provider [email or web address]  02/17/2022, 9:29 PM

## 2022-02-17 NOTE — Assessment & Plan Note (Signed)
With remote history of cholecystectomy - Status post ERCP for CBD stone removal in 12/10/2017 - Currently reassuring with no leukocytosis and/or fever - Recheck LFTs in a.m.

## 2022-02-17 NOTE — Consult Note (Signed)
Cardiology Consultation   Patient ID: [name] MRN: [id number]; DOB: May 08, 1930  Admit date: 02/17/2022 Date of Consult: 02/17/2022  PCP:  [name] [place] Providers Cardiologist:  Audrey Rogue, MD      Patient Profile:   [name] is a 89 y.o. female with a hx of coronary artery disease status post PCI and subsequent CABG in 2018 (LIMA-LAD, SVG-OM, SVG-RPDA), TIA, bilateral carotid artery disease, hypertension, hyperlipidemia, and anxiety who is being seen 02/17/2022 for the evaluation of chest pain at the request of Dr. Quentin Foster.  History of Present Illness:   [name] was in her usual state of health until about 3 hours after waking up this morning.  She had just finished eating breakfast and developed severe pain across her upper abdomen and moving into the lower chest.  It was unlike anything that she had felt before, including the chest pain at the time of her MI in 2018.  Pain lasted a few hours and ultimately prompted her to call 911.  In route to hospital, the pain began to subside.  She notes some mild shortness of breath and nausea without vomiting.  She has intermittent dark stools, which is nothing new for her.  She denies palpitations, lightheadedness, and edema.  She has been compliant with her medications, other than carvedilol that had been prescribed by Dr. Rockey Foster.  She noted weakness and bradycardia with reported home HR's into the 30's.  She stopped taking the medication 3-4 days ago on the recommendation of her grandson.  In the ED, patient underwent CT chest, abdomen, and pelvis.  There was concern for ACS given incidental note of occluded SVG-RPDA on the CTA chest.  CT abdomen and pelvis showed pneumobilia and mild peripheral peribiliary enhancement near the ampulla.  There was also question of tumefactive sludge or biliary calculus in the mid common bile duct.   Past Medical History:  Diagnosis Date   Anxiety     Atypical carcinoid lung tumor ([place])    CAD (coronary artery disease)    a. 1986: s/p PTCA of bifurcation lesion in LAD;  b. 2001: s/p BMS to LAD & LCX;  c. 04/2006 Cath: diff CAD w/o critical narrowing, nl EF;  d. 12/2014 MV: inflat ST dep, mild basal inf ischemia, EF 55-65%-->Low risk; e. 12/2016 - 3V CABG.   Cancer [facility]) yrs ago her pt   right lobe of lung. surgery only.    Carotid disease, bilateral ([place])    a. 08/2015 Carotid U/S: < 39% bilat ICA stenosis.   Cataracts, bilateral    Cholangitis    Choledocholithiasis    Cholelithiases    Chronic diarrhea    Depression    Frequent PVCs    Gilbert's syndrome    Headache    PMH: migraines   History of echocardiogram    a. 06/2010 Echo: EF 60-65%, mild AI/MR; b. 10/2015 Echo: EF 60-65%, no rwma, mild MR, nl RV fxn.   HTN (hypertension)    Hyperlipidemia    Impaired fasting blood sugar    Keloid    mid sternum   Macular degeneration    Osteoarthritis    Pneumonia    PONV (postoperative nausea and vomiting)    Positional vertigo    benign   Postoperative atrial fibrillation ([place]) 12/2016   S/P CABG x 3 12/23/2016   LIMA to LAD, SVG to OM, SVG to PDA, EVH via left thigh   TIA (transient  ischemic attack)    Wears glasses    Wears hearing aid     Past Surgical History:  Procedure Laterality Date   ABDOMINAL AORTOGRAM N/A 12/22/2016   Procedure: ABDOMINAL AORTOGRAM;  Surgeon: Nelva Bush, MD;  Location: [facility];  Service: Cardiovascular;  Laterality: N/A;   ABDOMINAL HYSTERECTOMY     BILIARY STENT PLACEMENT  12/10/2017   Procedure: BILIARY STENT PLACEMENT;  Surgeon: Irving Copas., MD;  Location: [place];  Service: Gastroenterology;;   CHOLECYSTECTOMY     CORONARY ARTERY BYPASS GRAFT N/A 12/23/2016   Procedure: CORONARY ARTERY BYPASS GRAFTING (CABG) TIMES THREE USING LEFT INTERNAL MAMMARY ARTERY AND LEFT SAPHENOUS LEG VEIN HARVESTED ENDOSCOPICALLY,;  Surgeon: Rexene Alberts, MD;  Location: [place];  Service: Open Heart Surgery;  Laterality: N/A;   ENDOSCOPIC RETROGRADE CHOLANGIOPANCREATOGRAPHY (ERCP) WITH PROPOFOL N/A 02/21/2018   Procedure: ENDOSCOPIC RETROGRADE CHOLANGIOPANCREATOGRAPHY (ERCP) WITH PROPOFOL;  Surgeon: Rush Landmark Telford Nab., MD;  Location: [place];  Service: Gastroenterology;  Laterality: N/A;   ERCP N/A 12/10/2017   Procedure: ENDOSCOPIC RETROGRADE CHOLANGIOPANCREATOGRAPHY (ERCP);  Surgeon: Irving Copas., MD;  Location: [place];  Service: Gastroenterology;  Laterality: N/A;   IABP INSERTION N/A 12/22/2016   Procedure: IABP Insertion;  Surgeon: Nelva Bush, MD;  Location: [place] [facility];  Service: Cardiovascular;  Laterality: N/A;   LEFT HEART CATH AND CORONARY ANGIOGRAPHY N/A 12/22/2016   Procedure: LEFT HEART CATH AND CORONARY ANGIOGRAPHY;  Surgeon: Nelva Bush, MD;  Location: [place] [facility];  Service: Cardiovascular;  Laterality: N/A;   LEFT HEART CATHETERIZATION WITH CORONARY ANGIOGRAM N/A 04/27/2012   Procedure: LEFT HEART CATHETERIZATION WITH CORONARY ANGIOGRAM;  Surgeon: Peter M Martinique, MD;  Location: [facility] C[facility];  Service: Cardiovascular;  Laterality: N/A;   LOBECTOMY Right    RLL atypical carcinoid tumor - Dr Eugenia Mcalpine  1986   PCI of LAD  2001   with non des lad    pci of mid cfx     with non des and cutting balloon pci of av circumflex   REMOVAL OF STONES  02/21/2018   Procedure: REMOVAL OF STONES;  Surgeon: Irving Copas., MD;  Location: [place];  Service: Gastroenterology;;   Joan Mayans  02/21/2018   Procedure: Joan Mayans;  Surgeon: Irving Copas., MD;  Location: [place];  Service: Gastroenterology;;   Lavell Islam REMOVAL  02/21/2018   Procedure: STENT REMOVAL;  Surgeon: Irving Copas., MD;  Location: [place];  Service: Gastroenterology;;   TEE WITHOUT CARDIOVERSION N/A 12/23/2016   Procedure: TRANSESOPHAGEAL ECHOCARDIOGRAM (TEE);  Surgeon: Rexene Alberts, MD;   Location: [place];  Service: Open Heart Surgery;  Laterality: N/A;     Home Medications:  Prior to Admission medications   Medication Sig Start Date Demarus Latterell Date Taking? Authorizing Provider  acetaminophen (TYLENOL) 325 MG tablet Take 2 tablets (650 mg total) by mouth every 6 (six) hours as needed for mild pain or headache. 01/02/17   Barrett, Erin R, PA-C  amLODipine (NORVASC) 5 MG tablet Take 1 tablet (5 mg total) by mouth 2 (two) times daily. 1 tablet in the morning and half tablet at night. 10/08/20   Minna Merritts, MD  aspirin 81 MG tablet Take 81 mg by mouth daily.    [provider]  atorvastatin (LIPITOR) 40 MG tablet TAKE 1 TABLET BY MOUTH EVERY DAY 12/23/21   Minna Merritts, MD  calcium carbonate (TUMS EX) 750 MG chewable tablet Chew 2 tablets by mouth as needed  for heartburn.    [provider]  Calcium Carbonate-Vitamin D (CALCIUM 600 + D PO) Take 1 tablet by mouth daily. 600 mg/ 400 mg    [provider]  carvedilol (COREG) 12.5 MG tablet Take 0.5 tablet (6.25 mg) by mouth twice daily 12/15/21   [provider]  estradiol (ESTRACE) 0.5 MG tablet Take 0.5 mg by mouth daily. 10/04/19   [provider]  famotidine (PEPCID) 20 MG tablet Take 20 mg by mouth 2 (two) times daily.    [provider]  furosemide (LASIX) 40 MG tablet TAKE 1 TABLET BY MOUTH EVERY DAY 12/23/21   Gollan, Kathlene November, MD  KLOR-CON M20 20 MEQ tablet TAKE 1 TABLET BY MOUTH TWICE A DAY 02/11/22   Kate Sable, MD  LORazepam (ATIVAN) 0.5 MG tablet Take 1 tablet (0.5 mg total) by mouth every 8 (eight) hours as needed for anxiety. 01/02/17   Barrett, Erin R, PA-C  Multiple Vitamin (MULTIVITAMIN) tablet Take 1 tablet by mouth daily.    [provider]  nitroGLYCERIN (NITROSTAT) 0.4 MG SL tablet Place 1 tablet (0.4 mg total) under the tongue every 5 (five) minutes as needed for chest pain. 10/08/20   Minna Merritts, MD  telmisartan (MICARDIS) 80 MG tablet  TAKE 1 TABLET BY MOUTH EVERY DAY 12/23/21   Minna Merritts, MD    Inpatient Medications: Scheduled Meds:  [START ON 02/18/2022] aspirin EC  81 mg Oral Daily   atorvastatin  40 mg Oral QHS   carvedilol  12.5 mg Oral BID WC   [START ON 02/18/2022] famotidine  20 mg Oral Daily   heparin  5,000 Units Subcutaneous Q8H   [START ON 02/18/2022] multivitamin with minerals  1 tablet Oral Daily   Continuous Infusions:  PRN Meds: acetaminophen **OR** acetaminophen, calcium carbonate, hydrALAZINE, LORazepam, morphine injection, nitroGLYCERIN, ondansetron (ZOFRAN) IV  Allergies:    Allergies  Allergen Reactions   Sertraline Hcl Nausea Only   Biaxin [Clarithromycin]     depressed   Celecoxib Other (See Comments)    Reaction: Unknown   Ciprofloxacin    Codeine Other (See Comments)    Reaction: Unknown   Ferrous Sulfate Other (See Comments)   Hydromorphone    Lisinopril     cough   Macrobid [Nitrofurantoin Monohyd Macro] Other (See Comments)    Reaction: Unknown   Oxycodone Hcl Other (See Comments)    Reaction: Unknown   Prednisone    Sertraline Hcl Other (See Comments)    Reaction: Unknown   Penicillins Rash    Reaction: Unknown    Social History:   Social History   Socioeconomic History   Marital status: Divorced    Spouse name: Not on file   Number of children: Not on file   Years of education: Not on file   Highest education level: Not on file  Occupational History   Occupation: retired  Tobacco Use   Smoking status: Never   Smokeless tobacco: Never  Vaping Use   Vaping Use: Never used  Substance and Sexual Activity   Alcohol use: No   Drug use: No   Sexual activity: Not Currently  Other Topics Concern   Not on file  Social History Narrative   Not on file   Social Determinants of Health   Financial Resource Strain: Not on file  Food Insecurity: No Food Insecurity (09/24/2021)   Hunger Vital Sign    Worried About Running Out of Food in the Last Year: Never true  Ran Out of Food in the Last Year: Never true  Transportation Needs: No Transportation Needs (09/24/2021)   PRAPARE - Hydrologist (Medical): No    Lack of Transportation (Non-Medical): No  Physical Activity: Unknown (02/03/2018)   Exercise Vital Sign    Days of Exercise per Week: 0 days    Minutes of Exercise per Session: Not on file  Stress: No Stress Concern Present (02/03/2018)   [place]    Feeling of Stress : Not at all  Social Connections: Unknown (02/03/2018)   Social Connection and Isolation Panel [NHANES]    Frequency of Communication with Friends and Family: Patient refused    Frequency of Social Gatherings with Friends and Family: Patient refused    Attends Religious Services: Patient refused    Active Member of Clubs or Organizations: Patient refused    Attends Archivist Meetings: Patient refused    Marital Status: Patient refused  Intimate Partner Violence: Not At Risk (09/24/2021)   Humiliation, Afraid, Rape, and Kick questionnaire    Fear of Current or Ex-Partner: No    Emotionally Abused: No    Physically Abused: No    Sexually Abused: No    Family History:   Family History  Problem Relation Age of Onset   Diabetes Mellitus I Mother    CAD Mother    CAD Father    Diabetes Mellitus I Sister    Diabetes Mellitus I Sister    Diabetes Mellitus I Brother    Colon cancer Neg Hx    Esophageal cancer Neg Hx    Inflammatory bowel disease Neg Hx    Liver disease Neg Hx    Pancreatic cancer Neg Hx    Rectal cancer Neg Hx    Stomach cancer Neg Hx      ROS:  Please see the history of present illness.  All other ROS reviewed and negative.     Physical Exam/Data:   Vitals:   02/17/22 1152 02/17/22 1153 02/17/22 1530 02/17/22 1821  BP: (!) 152/85  (!) 148/83 (!) 163/90  Pulse: 93  98 100  Resp: '18  18 20  '$ Temp: (!) 97.5 F (36.4 C)  (!) 97.5 F (36.4 C)  (!) 97.4 F (36.3 C)  TempSrc: Oral   Oral  SpO2: 98%  98% 99%  Weight:  90.7 kg    Height:  5' 6.5" (1.689 m)      Intake/Output Summary (Last 24 hours) at 02/17/2022 1946 Last data filed at 02/17/2022 1823 Gross per 24 hour  Intake 240 ml  Output --  Net 240 ml      02/17/2022   11:53 AM 01/23/2022    1:58 PM 10/17/2021   11:32 AM  Last 3 Weights  Weight (lbs) 200 lb 200 lb 201 lb 9.6 oz  Weight (kg) 90.719 kg 90.719 kg 91.445 kg     Body mass index is 31.8 kg/m.  General:  Well nourished, well developed, in no acute distress. HEENT: normal Neck: no JVD Vascular: No carotid bruits; Distal pulses 2+ bilaterally Cardiac:  normal S1, S2; RRR; no murmurs, rubs, or gallops Lungs:  clear to auscultation bilaterally, no wheezing, rhonchi or rales  Abd: Soft with RUQ tenderness and voluntary guarding Ext: no edema Musculoskeletal:  No deformities, BUE and BLE strength normal and equal Skin: warm and dry  Neuro:  CNs 2-12 intact, no focal abnormalities noted Psych:  Normal affect  EKG:  The EKG was personally reviewed and demonstrates:  Normal sinus rhythm with septal infarct and inferolateral ST/T changes. Telemetry:  Telemetry was personally reviewed and demonstrates:  Sinus rhythm with PVC's.  Relevant CV Studies: TTE (09/24/2021):  1. Left ventricular ejection fraction, by estimation, is 60 to 65%. The  left ventricle has normal function. The left ventricle has no regional  wall motion abnormalities. There is mild left ventricular hypertrophy.  Left ventricular diastolic parameters  are consistent with Grade I diastolic dysfunction (impaired relaxation).   2. Right ventricular systolic function is normal. The right ventricular  size is normal. There is normal pulmonary artery systolic pressure. The  estimated right ventricular systolic pressure is 42.7 mmHg.   3. The mitral valve is normal in structure. Moderate mitral valve  regurgitation. No evidence of mitral stenosis.    4. Tricuspid valve regurgitation is moderate.   5. The aortic valve has an indeterminant number of cusps. Aortic valve  regurgitation is not visualized. No aortic stenosis is present. Aortic  valve mean gradient measures 7.5 mmHg.   6. The inferior vena cava is normal in size with greater than 50%  respiratory variability, suggesting right atrial pressure of 3 mmHg.   Laboratory Data:  High Sensitivity Troponin:   Recent Labs  Lab 02/17/22 1156 02/17/22 1425  TROPONINIHS 12 15     Chemistry Recent Labs  Lab 02/17/22 1156  NA 138  K 3.8  CL 102  CO2 27  GLUCOSE 179*  BUN 22  CREATININE 1.02*  CALCIUM 9.4  GFRNONAA 53*  ANIONGAP 9    Recent Labs  Lab 02/17/22 1425  PROT 7.6  ALBUMIN 4.2  AST 85*  ALT 53*  ALKPHOS 111  BILITOT 1.8*   Lipids No results for input(s): "CHOL", "TRIG", "HDL", "LABVLDL", "LDLCALC", "CHOLHDL" in the last 168 hours.  Hematology Recent Labs  Lab 02/17/22 1156  WBC 5.2  RBC 4.36  HGB 13.3  HCT 40.2  MCV 92.2  MCH 30.5  MCHC 33.1  RDW 12.4  PLT 174   Thyroid No results for input(s): "TSH", "FREET4" in the last 168 hours.  BNPNo results for input(s): "BNP", "PROBNP" in the last 168 hours.  DDimer No results for input(s): "DDIMER" in the last 168 hours.   Radiology/Studies:  CT Angio Chest PE W/Cm &/Or Wo Cm  Addendum Date: 02/17/2022   ADDENDUM REPORT: 02/17/2022 16:48 ADDENDUM: Imaging findings were discussed with Dr. Quentin Foster of the emergency department rather than Dr. Charna Archer as was outlined in the previous addendum. Nodules in the RIGHT lung base while favored to be infectious or inflammatory follow-up imaging is suggested to ensure resolution in this patient with history of lung cancer. This was also relayed via telephone, by me, at the time discussion. Electronically Signed   By: Zetta Bills M.D.   On: 02/17/2022 16:48   Addendum Date: 02/17/2022   ADDENDUM REPORT: 02/17/2022 16:11 ADDENDUM: These results were called by  telephone at the time of interpretation on 02/17/2022 at 4:01 pm to provider [facility] , who verbally acknowledged these results. Electronically Signed   By: Zetta Bills M.D.   On: 02/17/2022 16:11   Result Date: 02/17/2022 CLINICAL DATA:  Abdominal pain.  History of RIGHT lung cancer. * Tracking Code: BO * EXAM: CT ANGIOGRAPHY CHEST WITH CONTRAST TECHNIQUE: Multidetector CT imaging of the chest was performed using the standard protocol during bolus administration of intravenous contrast. Multiplanar CT image reconstructions and MIPs were obtained to evaluate the vascular anatomy.  RADIATION DOSE REDUCTION: This exam was performed according to the departmental dose-optimization program which includes automated exposure control, adjustment of the mA and/or kV according to patient size and/or use of iterative reconstruction technique. CONTRAST:  1100m OMNIPAQUE IOHEXOL 350 MG/ML SOLN COMPARISON:  November of 2019. FINDINGS: Cardiovascular: Calcified aortic atherosclerotic changes. Signs of median sternotomy. Dense material along the margin of the ascending thoracic aorta near the proximal arch related to dystrophic calcification or pledget material the setting of prior surgery. No signs of aortic dilation. Irregularity of ascending thoracic aorta with subtle suspected focal dissection perhaps related to prior aortic manipulation without substantial change as a subtle abnormality on prior imaging (image 172/4) Heart size mildly enlarged. Three-vessel coronary artery calcification and signs of prior bypass. Study optimized for evaluation of pulmonary vascular bed which is opacified to between 490 and 600 Hounsfield units. Study is negative for pulmonary embolism. Mediastinum/Nodes: Mildly patulous esophagus. No thoracic inlet adenopathy. No axillary lymphadenopathy. No hilar adenopathy. Lungs/Pleura: Signs of pleural thickening along the posteromedial RIGHT chest following RIGHT lower lobectomy and along the  anterolateral RIGHT chest with mild pleural thickening associated with calcification likely related to postoperative and post treatment changes. Trace pleural effusion at the RIGHT lung base is similar to prior imaging. No pneumothorax. No signs of dense consolidative process. Subtle tree in bud in the posterior RIGHT upper lobe (image 66/3) this is stable compared to previous imaging. Tree-in-bud opacities are also noted at the RIGHT lung base which are new compared to previous imaging in the largest area of nodularity is a discrete nodule along the pleural surface measuring approximately 5 x 7 mm (image 111/3) no additional signs of new nodule. There are other small nodules along the pleural surface of the RIGHT lung. Upper Abdomen: Incidental imaging of upper abdominal contents with signs of pneumobilia. No acute upper abdominal findings. See dedicated abdominal report for further details. Abdominal contents Musculoskeletal: Spinal degenerative changes without acute or destructive bone process. Review of the MIP images confirms the above findings. IMPRESSION: 1. Study negative for pulmonary embolism. 2. This non gated study shows no definitive signs of opacification of the RIGHT coronary graft. Chronicity is uncertain but perhaps there was some contrast in the graft on previous imaging from 2021. Correlate with cardiac enzymes. 3. Signs of pleural thickening along the posteromedial RIGHT chest following RIGHT lower lobectomy and along the anterolateral RIGHT chest with mild pleural thickening associated with calcification likely related to postoperative and post treatment changes. Trace pleural effusion at the RIGHT lung base is similar to prior imaging. 4. Tree-in-bud opacities are noted at the RIGHT lung base which are new compared to previous imaging in the largest area of nodularity is a discrete nodule along the pleural surface measuring approximately 5 x 7 mm. Findings are concerning for infectious or  inflammatory process. 5. Irregularity of ascending thoracic aorta with subtle suspected focal dissection perhaps related to prior aortic manipulation without substantial change as a subtle abnormality on prior imaging. Consider follow-up at 3-6 months to ensure stability but lack of stranding and thickening in this area and presence on prior imaging without change since 2021 6. Three-vessel coronary artery calcification and signs of prior bypass. 7. Aortic atherosclerosis. Aortic Atherosclerosis (ICD10-I70.0). A call is out to the referring provider to further discuss findings in the above case. Electronically Signed: By: GZetta BillsM.D. On: 02/17/2022 15:47   CT Abdomen Pelvis W Contrast  Result Date: 02/17/2022 CLINICAL DATA:  Abdominal pain in a patient with history  of lung cancer. Pain is radiating to the chest by report. * Tracking Code: BO * EXAM: CT ABDOMEN AND PELVIS WITH CONTRAST TECHNIQUE: Multidetector CT imaging of the abdomen and pelvis was performed using the standard protocol following bolus administration of intravenous contrast. RADIATION DOSE REDUCTION: This exam was performed according to the departmental dose-optimization program which includes automated exposure control, adjustment of the mA and/or kV according to patient size and/or use of iterative reconstruction technique. CONTRAST:  165m OMNIPAQUE IOHEXOL 350 MG/ML SOLN COMPARISON:  Abdominal imaging from 2019 in prior your CP Z. FINDINGS: Lower chest: Signs of mild cardiac to moderate cardiac enlargement, see dedicated chest CT for further details regarding chest findings. Patchy nodularity at the RIGHT lung base dominant nodule approaching a cm in size. Hepatobiliary: Pneumobilia with mild peripheral peribiliary enhancement near the ampulla. CBD dilation is improved compared to 2019 at 13 mm as compared to 16 mm but there is a potential filling defect in the common bile duct measuring approximately 16 x 8 mm. No focal, suspicious  hepatic lesion. Portal vein is patent. SMV is patent. Hepatic veins are patent. Pancreas: Mild atrophy of the pancreas without ductal dilation, inflammation or visible lesion. Spleen: Calcifications along the margin of the spleen, no focal lesion in the spleen. Spleen is not enlarged. Adrenals/Urinary Tract: Adrenal glands are normal. Symmetric renal enhancement without hydronephrosis or suspicious renal lesion. No thickening of the urinary bladder. No nephrolithiasis or ureteral calculi on this contrasted exam. Stomach/Bowel: Small hiatal hernia. No stranding adjacent to the stomach. No small bowel obstruction or inflammation. Appendix is not visible though there are no secondary signs that would suggest acute appendicitis. No signs of colonic inflammation. Vascular/Lymphatic: Aortic atherosclerosis. No sign of aneurysm. Smooth contour of the IVC. There is no gastrohepatic or hepatoduodenal ligament lymphadenopathy. No retroperitoneal or mesenteric lymphadenopathy. No pelvic sidewall lymphadenopathy. Reproductive: Post hysterectomy.  No ascites. Other: No ascites. Small nodule in the RIGHT mesorectum is not substantially changed compared to imaging from 2019 (image 77/4) this measures approximately 8 mm and is been present to some extent since 2013 perhaps related to prior inflammation. Musculoskeletal: No acute bone finding. No destructive bone process. Spinal degenerative changes. IMPRESSION: 1. Pneumobilia with mild peripheral peribiliary enhancement near the ampulla. Correlate with any clinical or laboratory evidence of cholangitis. There is also a potential filling defect which may represent tumefactive sludge or a biliary calculus in the mid common bile duct. MRCP could be considered as warranted for further evaluation. Biliary duct dilation is improved compared to more remote imaging from 2019. 2. Patchy nodularity at the RIGHT lung base dominant nodule approaching a cm in size. Correlate with any history of  recent infection or aspiration. Given discrete nodule at the RIGHT lung base would suggest follow-up in 8-12 weeks to exclude persistence in this patient with lung cancer. 3. Small hiatal hernia. 4. Aortic atherosclerosis. Aortic Atherosclerosis (ICD10-I70.0). Electronically Signed   By: GZetta BillsM.D.   On: 02/17/2022 16:00   DG Chest 2 View  Result Date: 02/17/2022 CLINICAL DATA:  cp EXAM: CHEST - 2 VIEW COMPARISON:  09/24/2021. FINDINGS: Cardiac silhouette is unremarkable. No pneumothorax or pleural effusion. The lungs are clear. Aorta ectatic and calcified. The visualized skeletal structures are unremarkable. Patient is status post median sternotomy and CABG. IMPRESSION: No acute cardiopulmonary process. Electronically Signed   By: JSammie BenchM.D.   On: 02/17/2022 12:38     Assessment and Plan:   Coronary artery disease with atypical chest pain: Main  complaint is of epigastric pain that occurred after eating breakfast and radiated to the chest.  Pain has since subsided.  No exertional chest pain repoted prior to this episode.  EKG shows inferolateral ST/T changes that were noted on prior tracing in 10/2021 and 01/2022.  HS-TnI has been negative x 2.  Finding of occluded SVG-rPDA is likely coincidental; appearance on CTA is most consistent with a chronically occluded graft, as his her negative HS-TnI x2.  Given absence of ongoing symptoms, I do not recommend initiation of IV heparin.  I am most concerned that her presenting symptoms may be hepatobiliary in origin (see below).  I recommend judicious reinitiation of carvedilol.  Echocardiogram is pending.  Based on symptoms, echo findings, and results of hepatobiliary w/u myocardial perfusion stress test may need to be considered prior to discharge.  Continue aspirin and atorvastatin for secondary prevention.  Abdominal pain: Query cholangitis for other hepatobiliary pathology with pneumobilia and other CT findings as well as mild  transaminitis.  Further workup (ultrasound and/or MRCP) recommended.  Hypertension: BP suboptimally controlled but potentially exacerbated by pain/stress as well as recent discontinuation of carvedilol.  I will restart carvedilol 3.125 mg twice daily.  Consider adding back amlodipine as well based on BP response.  Chronic HFpEF: Ms. Podgorski appears euvolemic.  Maintain net even fluid balance  May need to restart standing furosemide as soon as tomorrow to achieve this based on her oral intake.  For questions or updates, please contact [facility] Please consult [email or web address] for contact info under [facility].  Signed, Nelva Bush, MD  02/17/2022 7:46 PM

## 2022-02-17 NOTE — ED Provider Notes (Signed)
Discussed CT imaging findings with cardiology likely chronic in nature.  Given stable enzymes unlikely to need urgent cath.  Patient reexamined she is currently pain-free.  On multiple reexaminations of her abdomen she denies any right upper quadrant tenderness therefore very low suspicion for cholangitis particular given lack of leukocytosis or fever.  Will consult hospitalist for admission   Merlyn Lot, MD 02/17/22 [phone]

## 2022-02-17 NOTE — Assessment & Plan Note (Addendum)
-   Home atorvastatin 40 mg nightly not resumed due to elevated LFT

## 2022-02-17 NOTE — Assessment & Plan Note (Signed)
-   Right upper quadrant abdominal ultrasound ordered - Repeat LFTs in the a.m.

## 2022-02-17 NOTE — Assessment & Plan Note (Deleted)
-   With initial concerning EKG showing aVR ST elevation and leads II, 3, V3 depression - EDP consulted [facility] cardiology who recommends against left heart cath given the patient's troponin were negative and her chest pain has resolved - Dr. Saunders Revel recommends GI consult, echo, and possible stress test should her symptoms persist post GI evaluation

## 2022-02-17 NOTE — Assessment & Plan Note (Signed)
-   This complicates overall care and prognosis.  

## 2022-02-17 NOTE — Assessment & Plan Note (Addendum)
-   Aspirin 81 mg daily not resumed on admission due to possible GI procedure

## 2022-02-17 NOTE — Assessment & Plan Note (Addendum)
Versus chest pain - In the ED, with initial concerning EKG showing aVR ST elevation and leads II, 3, V3 depression - EDP consulted [facility] cardiology who recommends against left heart cath given the patient's troponin were negative and her chest pain has resolved - Dr. Saunders Revel recommends GI consult, echo, and possible stress test should her symptoms persist post GI evaluation -The pain is consistent with epigastric and or atypical in nature, low clinical suspicion for ACS given negative troponins - Right upper quadrant abdominal ultrasound ordered, if unremarkable would would recommend a.m. team to consider MRCP as patient has a history of acute cholangitis due to CBD stone and obstruction requiring ERCP in November 2019 - Gastroenterology service consultation placed via staff message in Epic order to Dr. Haig Prophet - Symptomatic support: Morphine 2 mg IV every 4 hours as needed for severe pain, 4 doses ordered - Admit to telemetry cardiac, observation

## 2022-02-17 NOTE — Assessment & Plan Note (Signed)
-   Ativan 0.5 mg p.o. every 6 hours as needed for anxiety, 2 doses ordered

## 2022-02-17 NOTE — Assessment & Plan Note (Signed)
-   Aspirin 81 mg daily - Home atorvastatin not resumed on admission due to elevated LFTs

## 2022-02-17 NOTE — ED Provider Notes (Signed)
[facility] Provider Note    Event Date/Time   First MD Initiated Contact with Patient 02/17/22 1402     (approximate)   History   Chief Complaint Chest Pain   HPI  [name] is a 89 y.o. female with past medical history of hypertension, CAD status post CABG, diastolic CHF, and anxiety who presents to the ED complaining of chest pain.  Patient reports acute onset of pain starting in her lower chest/epigastrium and moving upwards around 10:00 this morning.  EMS was called and gave patient 324 mg of aspirin along with 1 spray of nitro.  Patient now states that her pain is still present but much improved.  She denies any difficulty breathing and has not had any recent fevers or cough.  She denies any nausea or vomiting.     Physical Exam   Triage Vital Signs: ED Triage Vitals  Enc Vitals Group     BP 02/17/22 1152 (!) 152/85     Pulse Rate 02/17/22 1152 93     Resp 02/17/22 1152 18     Temp 02/17/22 1152 (!) 97.5 F (36.4 C)     Temp Source 02/17/22 1152 Oral     SpO2 02/17/22 1152 98 %     Weight 02/17/22 1153 200 lb (90.7 kg)     Height 02/17/22 1153 5' 6.5" (1.689 m)     Head Circumference --      Peak Flow --      Pain Score 02/17/22 1155 10     Pain Loc --      Pain Edu? --      Excl. in [place]? --     Most recent vital signs: Vitals:   02/17/22 1152 02/17/22 1530  BP: (!) 152/85 (!) 148/83  Pulse: 93 98  Resp: 18 18  Temp: (!) 97.5 F (36.4 C) (!) 97.5 F (36.4 C)  SpO2: 98% 98%    Constitutional: Alert and oriented. Eyes: Conjunctivae are normal. Head: Atraumatic. Nose: No congestion/rhinnorhea. Mouth/Throat: Mucous membranes are moist.  Cardiovascular: Normal rate, regular rhythm. Grossly normal heart sounds.  2+ radial pulses bilaterally. Respiratory: Normal respiratory effort.  No retractions. Lungs CTAB.  No chest wall tenderness to palpation. Gastrointestinal: Soft and tender to palpation in the bilateral upper quadrants  with no rebound or guarding. No distention. Musculoskeletal: No lower extremity tenderness nor edema.  Neurologic:  Normal speech and language. No gross focal neurologic deficits are appreciated.    ED Results / Procedures / Treatments   Labs (all labs ordered are listed, but only abnormal results are displayed) Labs Reviewed  BASIC METABOLIC PANEL - Abnormal; Notable for the following components:      Result Value   Glucose, Bld 179 (*)    Creatinine, Ser 1.02 (*)    GFR, Estimated 53 (*)    All other components within normal limits  HEPATIC FUNCTION PANEL - Abnormal; Notable for the following components:   AST 85 (*)    ALT 53 (*)    Total Bilirubin 1.8 (*)    Bilirubin, Direct 0.3 (*)    Indirect Bilirubin 1.5 (*)    All other components within normal limits  CBC  LIPASE, BLOOD  TROPONIN I (HIGH SENSITIVITY)  TROPONIN I (HIGH SENSITIVITY)     EKG  ED ECG REPORT I, Blake Divine, the attending physician, personally viewed and interpreted this ECG.   Date: 02/17/2022  EKG Time: 11:54  Rate: 110  Rhythm: sinus tachycardia  Axis: LAD  Intervals:none  ST&T Change: ST elevation in aVR with diffuse ST depressions  RADIOLOGY Chest x-ray reviewed and interpreted by me with no infiltrate, edema, or effusion.  PROCEDURES:  Critical Care performed: No  Procedures   MEDICATIONS ORDERED IN ED: Medications  iohexol (OMNIPAQUE) 350 MG/ML injection 100 mL (100 mLs Intravenous Contrast Given 02/17/22 1456)     IMPRESSION / MDM / ASSESSMENT AND PLAN / ED COURSE  I reviewed the triage vital signs and the nursing notes.                              89 y.o. female with past medical history of hypertension, CAD status post CABG, diastolic CHF, and anxiety who presents to the ED with acute onset pain starting in her epigastrium and moving up into her chest around 10:00 this morning, now improved.  Patient's presentation is most consistent with acute presentation with  potential threat to life or bodily function.  Differential diagnosis includes, but is not limited to, ACS, PE, pneumonia, pneumothorax, gastritis, pancreatitis, hepatitis, biliary colic, cholecystitis, gastritis, GERD.  Patient nontoxic-appearing and in no acute distress, vital signs are unremarkable.  Initial EKG shows ST elevation in aVR along with diffuse ST depressions, most prominent inferolaterally.  Findings most concerning for global subendocardial ischemia, appear new compared to previous.  Her chest pain seems much improved and initial troponin is negative, will trend troponin.  She additionally has tenderness to palpation in the bilateral upper quadrants of her abdomen.  We will further assess with CTA of her chest as well as CT of her abdomen/pelvis.  LFTs showed mild transaminitis and mild elevation in bilirubin, no acute electrolyte abnormality or AKI noted.  Patient turned over to oncoming provider pending CT results and repeat troponin, anticipate admission for high risk chest pain even if remainder of workup is unremarkable.      FINAL CLINICAL IMPRESSION(S) / ED DIAGNOSES   Final diagnoses:  Nonspecific chest pain     Rx / DC Orders   ED Discharge Orders     None        Note:  This document was prepared using Dragon voice recognition software and may include unintentional dictation errors.   Blake Divine, MD 02/17/22 [phone]

## 2022-02-17 NOTE — ED Triage Notes (Signed)
Pt comes via EMS from home with c/o epigastric and mid sternal cp. Pt states this started today. Pt states no sob. Pt states it felt like gas or air.

## 2022-02-17 NOTE — ED Triage Notes (Signed)
First Nurse Note;  Pt via EMS from home. Pt c/o belly pain that has travelled up into her chest starting this AM. EMS reports an extensive cardiac hx. Pt is A&Ox4 and NAD EMS gave 324 ASA  18G L AC  1 Spray of Nitro given, after nitro spray, pain resolved.  EMS reports 175/74 BP, 99% on RA, 90-120 Sinus Rhythm with PVC

## 2022-02-17 NOTE — Assessment & Plan Note (Addendum)
-   Resumed home carvedilol 6.25 mg p.o. twice daily with meals - Hydralazine 5 mg IV every 8 hours as needed for SBP greater than 175, 4 days ordered

## 2022-02-17 NOTE — Hospital Course (Addendum)
Ms. [name] is an 89 year old female with history of hypertension, GERD, hyperlipidemia, CAD status post PCI to mid LCX and LAD and three-vessel CABG in 2018 with LIMA to LAD and vein graft to PDA, vein graft to OM, history of lung cancer status right lower lobectomy, remote history of cholecystectomy 20 years ago, history of cholangitis secondary to CBD stone obstruction status post ERCP in 2019, who presents emergency department for chief concerns of chest pain.  Initial vitals in the ED shows temperature of 97.5, respiration rate of 18, heart rate 93, blood pressure 152/85, SpO2 of 98% on room air.  Serum sodium is 138, potassium 3.4, chloride 102, bicarb 27, BUN of 22, serum creatinine 1.02, EGFR 53, nonfasting blood glucose 179, WBC 5.2, hemoglobin 13.3, platelets of 174.  High sensitive troponin was initially 12 and on repeat was 15.  AST was 85, ALT was 53.  EKG was initially concerning for ST elevation at aVR with ST depression on anterior leads.  ED P discussed EKG with cardiology service, Dr. Saunders Revel and given that patient is not endorsing active chest pain, cardiology will defer emergent Cath Lab.  Cardiology will follow patient.  ED treatment: None

## 2022-02-18 ENCOUNTER — Observation Stay: Payer: Medicare Other

## 2022-02-18 DIAGNOSIS — K8050 Calculus of bile duct without cholangitis or cholecystitis without obstruction: Secondary | ICD-10-CM | POA: Diagnosis present

## 2022-02-18 DIAGNOSIS — R748 Abnormal levels of other serum enzymes: Secondary | ICD-10-CM | POA: Diagnosis present

## 2022-02-18 DIAGNOSIS — I2510 Atherosclerotic heart disease of native coronary artery without angina pectoris: Secondary | ICD-10-CM

## 2022-02-18 DIAGNOSIS — F32A Depression, unspecified: Secondary | ICD-10-CM | POA: Diagnosis present

## 2022-02-18 DIAGNOSIS — Z888 Allergy status to other drugs, medicaments and biological substances status: Secondary | ICD-10-CM | POA: Diagnosis not present

## 2022-02-18 DIAGNOSIS — R911 Solitary pulmonary nodule: Secondary | ICD-10-CM | POA: Insufficient documentation

## 2022-02-18 DIAGNOSIS — R079 Chest pain, unspecified: Secondary | ICD-10-CM

## 2022-02-18 DIAGNOSIS — I5032 Chronic diastolic (congestive) heart failure: Secondary | ICD-10-CM | POA: Diagnosis present

## 2022-02-18 DIAGNOSIS — I110 Hypertensive heart disease with heart failure: Secondary | ICD-10-CM | POA: Diagnosis present

## 2022-02-18 DIAGNOSIS — Z79899 Other long term (current) drug therapy: Secondary | ICD-10-CM | POA: Diagnosis not present

## 2022-02-18 DIAGNOSIS — Z6831 Body mass index (BMI) 31.0-31.9, adult: Secondary | ICD-10-CM | POA: Diagnosis not present

## 2022-02-18 DIAGNOSIS — R9389 Abnormal findings on diagnostic imaging of other specified body structures: Secondary | ICD-10-CM | POA: Diagnosis not present

## 2022-02-18 DIAGNOSIS — Z9889 Other specified postprocedural states: Secondary | ICD-10-CM | POA: Diagnosis not present

## 2022-02-18 DIAGNOSIS — I340 Nonrheumatic mitral (valve) insufficiency: Secondary | ICD-10-CM | POA: Diagnosis present

## 2022-02-18 DIAGNOSIS — Z66 Do not resuscitate: Secondary | ICD-10-CM | POA: Diagnosis present

## 2022-02-18 DIAGNOSIS — I25118 Atherosclerotic heart disease of native coronary artery with other forms of angina pectoris: Secondary | ICD-10-CM

## 2022-02-18 DIAGNOSIS — R6339 Other feeding difficulties: Secondary | ICD-10-CM | POA: Diagnosis not present

## 2022-02-18 DIAGNOSIS — R109 Unspecified abdominal pain: Secondary | ICD-10-CM | POA: Diagnosis not present

## 2022-02-18 DIAGNOSIS — R945 Abnormal results of liver function studies: Secondary | ICD-10-CM | POA: Diagnosis not present

## 2022-02-18 DIAGNOSIS — R5381 Other malaise: Secondary | ICD-10-CM

## 2022-02-18 DIAGNOSIS — Z9049 Acquired absence of other specified parts of digestive tract: Secondary | ICD-10-CM | POA: Diagnosis not present

## 2022-02-18 DIAGNOSIS — R1013 Epigastric pain: Secondary | ICD-10-CM | POA: Diagnosis not present

## 2022-02-18 DIAGNOSIS — Z8719 Personal history of other diseases of the digestive system: Secondary | ICD-10-CM

## 2022-02-18 DIAGNOSIS — E669 Obesity, unspecified: Secondary | ICD-10-CM | POA: Diagnosis present

## 2022-02-18 DIAGNOSIS — R531 Weakness: Secondary | ICD-10-CM | POA: Diagnosis present

## 2022-02-18 DIAGNOSIS — M79662 Pain in left lower leg: Secondary | ICD-10-CM | POA: Diagnosis not present

## 2022-02-18 DIAGNOSIS — M7989 Other specified soft tissue disorders: Secondary | ICD-10-CM | POA: Diagnosis not present

## 2022-02-18 DIAGNOSIS — I6523 Occlusion and stenosis of bilateral carotid arteries: Secondary | ICD-10-CM | POA: Diagnosis present

## 2022-02-18 DIAGNOSIS — R0789 Other chest pain: Secondary | ICD-10-CM | POA: Diagnosis present

## 2022-02-18 DIAGNOSIS — Z880 Allergy status to penicillin: Secondary | ICD-10-CM | POA: Diagnosis not present

## 2022-02-18 DIAGNOSIS — K311 Adult hypertrophic pyloric stenosis: Secondary | ICD-10-CM | POA: Diagnosis present

## 2022-02-18 DIAGNOSIS — Z951 Presence of aortocoronary bypass graft: Secondary | ICD-10-CM | POA: Diagnosis not present

## 2022-02-18 DIAGNOSIS — Z85118 Personal history of other malignant neoplasm of bronchus and lung: Secondary | ICD-10-CM | POA: Diagnosis not present

## 2022-02-18 DIAGNOSIS — M79661 Pain in right lower leg: Secondary | ICD-10-CM | POA: Diagnosis not present

## 2022-02-18 DIAGNOSIS — E785 Hyperlipidemia, unspecified: Secondary | ICD-10-CM

## 2022-02-18 DIAGNOSIS — I10 Essential (primary) hypertension: Secondary | ICD-10-CM | POA: Diagnosis not present

## 2022-02-18 DIAGNOSIS — F419 Anxiety disorder, unspecified: Secondary | ICD-10-CM | POA: Diagnosis present

## 2022-02-18 DIAGNOSIS — R262 Difficulty in walking, not elsewhere classified: Secondary | ICD-10-CM | POA: Diagnosis present

## 2022-02-18 DIAGNOSIS — E876 Hypokalemia: Secondary | ICD-10-CM | POA: Diagnosis present

## 2022-02-18 DIAGNOSIS — Z885 Allergy status to narcotic agent status: Secondary | ICD-10-CM | POA: Diagnosis not present

## 2022-02-18 LAB — HEPATIC FUNCTION PANEL
ALT: 36 U/L (ref 0–44)
AST: 43 U/L — ABNORMAL HIGH (ref 15–41)
Albumin: 3.4 g/dL — ABNORMAL LOW (ref 3.5–5.0)
Alkaline Phosphatase: 83 U/L (ref 38–126)
Bilirubin, Direct: 0.3 mg/dL — ABNORMAL HIGH (ref 0.0–0.2)
Indirect Bilirubin: 1.3 mg/dL — ABNORMAL HIGH (ref 0.3–0.9)
Total Bilirubin: 1.6 mg/dL — ABNORMAL HIGH (ref 0.3–1.2)
Total Protein: 6 g/dL — ABNORMAL LOW (ref 6.5–8.1)

## 2022-02-18 LAB — CBC
HCT: 35 % — ABNORMAL LOW (ref 36.0–46.0)
Hemoglobin: 11.7 g/dL — ABNORMAL LOW (ref 12.0–15.0)
MCH: 30.3 pg (ref 26.0–34.0)
MCHC: 33.4 g/dL (ref 30.0–36.0)
MCV: 90.7 fL (ref 80.0–100.0)
Platelets: 158 10*3/uL (ref 150–400)
RBC: 3.86 MIL/uL — ABNORMAL LOW (ref 3.87–5.11)
RDW: 12.6 % (ref 11.5–15.5)
WBC: 5 10*3/uL (ref 4.0–10.5)
nRBC: 0 % (ref 0.0–0.2)

## 2022-02-18 LAB — ECHOCARDIOGRAM COMPLETE
Area-P 1/2: 3.68 cm2
Height: 66.5 in
MV VTI: 0.97 cm2
S' Lateral: 2.5 cm
Weight: 3200 oz

## 2022-02-18 LAB — BASIC METABOLIC PANEL
Anion gap: 11 (ref 5–15)
BUN: 21 mg/dL (ref 8–23)
CO2: 25 mmol/L (ref 22–32)
Calcium: 9.2 mg/dL (ref 8.9–10.3)
Chloride: 104 mmol/L (ref 98–111)
Creatinine, Ser: 1.09 mg/dL — ABNORMAL HIGH (ref 0.44–1.00)
GFR, Estimated: 49 mL/min — ABNORMAL LOW (ref >60)
Glucose, Bld: 109 mg/dL — ABNORMAL HIGH (ref 70–99)
Potassium: 3.6 mmol/L (ref 3.5–5.1)
Sodium: 140 mmol/L (ref 135–145)

## 2022-02-18 MED ORDER — SODIUM CHLORIDE 0.9 % IV SOLN
2.0000 g | INTRAVENOUS | Status: DC
  Administered 2022-02-18 – 2022-02-19 (×2): 2 g via INTRAVENOUS
  Filled 2022-02-18: qty 2
  Filled 2022-02-18: qty 20
  Filled 2022-02-18: qty 2

## 2022-02-18 MED ORDER — LORAZEPAM 0.5 MG PO TABS
0.5000 mg | ORAL_TABLET | Freq: Three times a day (TID) | ORAL | Status: DC | PRN
  Administered 2022-02-18 – 2022-02-20 (×4): 0.5 mg via ORAL
  Filled 2022-02-18 (×4): qty 1

## 2022-02-18 MED ORDER — LOSARTAN POTASSIUM 25 MG PO TABS: 12.5000 mg | ORAL_TABLET | Freq: Every day | ORAL | Status: DC

## 2022-02-18 MED ORDER — PANTOPRAZOLE SODIUM 40 MG IV SOLR
40.0000 mg | INTRAVENOUS | Status: DC
  Administered 2022-02-18 – 2022-02-19 (×2): 40 mg via INTRAVENOUS
  Filled 2022-02-18 (×2): qty 10

## 2022-02-18 MED ORDER — CARVEDILOL 6.25 MG PO TABS: 6.2500 mg | ORAL_TABLET | Freq: Two times a day (BID) | ORAL | Status: DC

## 2022-02-18 MED ORDER — AMLODIPINE BESYLATE 5 MG PO TABS
5.0000 mg | ORAL_TABLET | Freq: Every day | ORAL | Status: DC
  Administered 2022-02-18 – 2022-02-20 (×3): 5 mg via ORAL
  Filled 2022-02-18 (×3): qty 1

## 2022-02-18 MED ORDER — PIPERACILLIN-TAZOBACTAM 3.375 G IVPB: 3.3750 g | Freq: Three times a day (TID) | INTRAVENOUS | Status: DC

## 2022-02-18 MED ORDER — CARVEDILOL 3.125 MG PO TABS
3.1250 mg | ORAL_TABLET | Freq: Two times a day (BID) | ORAL | Status: DC
  Administered 2022-02-18: 3.125 mg via ORAL
  Filled 2022-02-18: qty 1

## 2022-02-18 MED ORDER — METRONIDAZOLE 500 MG/100ML IV SOLN
500.0000 mg | Freq: Three times a day (TID) | INTRAVENOUS | Status: DC
  Administered 2022-02-20 (×2): 500 mg via INTRAVENOUS
  Filled 2022-02-18 (×4): qty 100

## 2022-02-18 NOTE — Progress Notes (Signed)
PROGRESS NOTE  [name]:[id number] DOB: 19-Oct-1930   PCP: [place], [place] @ [facility]  Patient is from: Home.  Ambulates holding to something.  Lives with family.  DOA: 02/17/2022 LOS: 0  Chief complaints Chief Complaint  Patient presents with   Chest Pain     Brief Narrative / Interim history: 89 year old F with PMH of CAD s/p CABG and PCI to LAD and mid LCx, lung cancer status post right lower lobe lobectomy, renal with cholecystectomy, cholangitis with CBD stone s/p ERCP in 2426, diastolic CHF, HTN, PVCs and anxiety presenting with acute abdominal pain across upper abdomen with associated shortness of breath and edema, and admitted for epigastric abdominal pain and abnormal EKG.  In ED, hemodynamically stable.  Afebrile.  Mildly elevated LFT.  No leukocytosis.  Serial troponin negative. EKG with ST/T changes in aVR and anterior leads.  CT angio chest negative for PE, tree-in-bud opacities in right lung base, 5 x 7 mm right lung nodule along the pleural surface and irregularity of ascending thoracic aorta.  CT abdomen and pelvis with pneumobilia, patchy nodularity at right lung base and right lung base nodule for which follow-up imaging in 8 to 12 weeks was recommended.  RUQ [place] and lower extremity venous Doppler ordered.  Cardiology and GI consulted.   Subjective: Seen and examined earlier this morning.  No major events overnight of this morning.  No complaints at the moment other than some back pain from lying in bed.  Pain has resolved.  She denies nausea or vomiting.  Denies chest pain or dyspnea.  Denies cough.  Objective: Vitals:   02/18/22 0323 02/18/22 0447 02/18/22 0841 02/18/22 1248  BP: (!) 150/76 (!) 155/73 (!) 157/74 (!) 149/92  Pulse: 83 (!) 46 85 83  Resp: '16 16 20 20  '$ Temp: 97.9 F (36.6 C) 98.4 F (36.9 C) 98.6 F (37 C) 98.1 F (36.7 C)  TempSrc:   Oral Oral  SpO2: 96% 97% 98% 98%  Weight:      Height:        Examination:  GENERAL: No  apparent distress.  Nontoxic. HEENT: MMM.  Vision and hearing grossly intact.  NECK: Supple.  No apparent JVD.  RESP:  No IWOB.  Fair aeration bilaterally. CVS:  RRR. Heart sounds normal.  ABD/GI/GU: BS+. Abd soft, NTND.  MSK/EXT:  Moves extremities. No apparent deformity. No edema.  SKIN: no apparent skin lesion or wound NEURO: Awake, alert and oriented appropriately.  No apparent focal neuro deficit. PSYCH: Calm. Normal affect.   Procedures:  None  Microbiology summarized: None  Assessment and plan: Principal Problem:   Epigastric abdominal pain Active Problems:   Essential hypertension   Anxiety   Obesity (BMI 30-39.9)   Coronary atherosclerosis   Mitral regurgitation   Coronary artery disease involving native coronary artery of native heart without angina pectoris   History of ERCP   History of biliary stent insertion   Physical deconditioning   Elevated liver enzymes   Hyperlipidemia   History of cholangitis   Nodule of lower lobe of right lung   Abnormal CT of the chest  Epigastric abdominal pain: Reports acute pain across upper abdomen.  Pain has currently resolved.  Imaging including CT chest, abdomen and pelvis, RUQ ultrasound and right lower extremity venous Doppler without acute finding to explain patient's symptoms.  She has no fever or leukocytosis to suggest acute infection.  Some abnormalities with EKG but felt to be chronic.  Echocardiogram without significant finding.  She appears euvolemic on exam.  She has history of remote cholecystectomy.  She had CBD stone with cholangitis in 2019 for which she had ERCP.  She has mild elevated LFT that is resolving. -GI consulted and following -Clear liquid diet and n.p.o. after midnight per GI -Start PPI -Pain control  Atypical chest pain/history of CAD/CABG and multiple stents: Chest pain seems to have resolved.  Serial troponin negative.  Some abnormalities of EKG likely chronic.  Echocardiogram  reassuring. -Cardiology following -Continue home Coreg   Essential hypertension: Normotensive. -Continue home Coreg per cardiology  Elevated liver enzymes: Resolving.  RUQ [place] with mildly echogenic liver  Anxiety -Continue home Ativan.  Abnormal CT chest/abdomen/pelvis: Showed new tree-in-bud opacities at right lung base. -Monitor off  antibiotics for now  Irregularity of ascending thoracic order -Follow-up imaging in 3 to 6 months recommended.  Pulmonary nodule: -Follow-up imaging outpatient.  Generalized weakness/ambulatory dysfunction: Reports ambulating holding to something at home. -PT/OT eval   Obesity: Body mass index is 31.8 kg/m.           DVT prophylaxis:  Place TED hose Start: 02/17/22 1628  Code Status: DNR/DNI Family Communication: None at bedside Level of care: Telemetry Cardiac Status is: Observation The patient will require care spanning > 2 midnights and should be moved to inpatient because: Evaluation for chest pain, upper abdominal pain   Final disposition: TBD Consultants:  Cardiology Gastroenterology  55 minutes with more than 50% spent in reviewing records, counseling patient/family and coordinating care.   Sch Meds:  Scheduled Meds:  amLODipine  5 mg Oral Daily   carvedilol  6.25 mg Oral BID WC   famotidine  20 mg Oral Daily   multivitamin with minerals  1 tablet Oral Daily   pantoprazole (PROTONIX) IV  40 mg Intravenous Q24H   Continuous Infusions:  lactated ringers 125 mL/hr at 02/18/22 0752   PRN Meds:.acetaminophen **OR** acetaminophen, calcium carbonate, hydrALAZINE, metoprolol tartrate, morphine injection, nitroGLYCERIN, ondansetron (ZOFRAN) IV  Antimicrobials: Anti-infectives (From admission, onward)    None        I have personally reviewed the following labs and images: CBC: Recent Labs  Lab 02/17/22 1156 02/18/22 0325  WBC 5.2 5.0  HGB 13.3 11.7*  HCT 40.2 35.0*  MCV 92.2 90.7  PLT 174 158   BMP  &GFR Recent Labs  Lab 02/17/22 1156 02/18/22 0325  NA 138 140  K 3.8 3.6  CL 102 104  CO2 27 25  GLUCOSE 179* 109*  BUN 22 21  CREATININE 1.02* 1.09*  CALCIUM 9.4 9.2   Estimated Creatinine Clearance: 40.1 mL/min (A) (by C-G formula based on SCr of 1.09 mg/dL (H)). Liver & Pancreas: Recent Labs  Lab 02/17/22 1425 02/18/22 0325  AST 85* 43*  ALT 53* 36  ALKPHOS 111 83  BILITOT 1.8* 1.6*  PROT 7.6 6.0*  ALBUMIN 4.2 3.4*   Recent Labs  Lab 02/17/22 1425  LIPASE 42   No results for input(s): "AMMONIA" in the last 168 hours. Diabetic: No results for input(s): "HGBA1C" in the last 72 hours. No results for input(s): "GLUCAP" in the last 168 hours. Cardiac Enzymes: No results for input(s): "CKTOTAL", "CKMB", "CKMBINDEX", "TROPONINI" in the last 168 hours. No results for input(s): "PROBNP" in the last 8760 hours. Coagulation Profile: No results for input(s): "INR", "PROTIME" in the last 168 hours. Thyroid Function Tests: No results for input(s): "TSH", "T4TOTAL", "FREET4", "T3FREE", "THYROIDAB" in the last 72 hours. Lipid Profile: No results for input(s): "CHOL", "HDL", "LDLCALC", "TRIG", "  CHOLHDL", "LDLDIRECT" in the last 72 hours. Anemia Panel: No results for input(s): "VITAMINB12", "FOLATE", "FERRITIN", "TIBC", "IRON", "RETICCTPCT" in the last 72 hours. Urine analysis:    Component Value Date/Time   COLORURINE STRAW (A) 10/31/2018 1029   APPEARANCEUR CLEAR (A) 10/31/2018 1029   APPEARANCEUR Clear 11/03/2013 1527   LABSPEC 1.003 (L) 10/31/2018 1029   LABSPEC 1.006 11/03/2013 1527   PHURINE 7.0 10/31/2018 1029   GLUCOSEU NEGATIVE 10/31/2018 1029   GLUCOSEU Negative 11/03/2013 1527   GLUCOSEU NEGATIVE 03/30/2011 1306   HGBUR NEGATIVE 10/31/2018 [place] 10/31/2018 1029   BILIRUBINUR Negative 11/03/2013 1527   KETONESUR NEGATIVE 10/31/2018 1029   PROTEINUR NEGATIVE 10/31/2018 1029   UROBILINOGEN 0.2 03/30/2011 1306   NITRITE NEGATIVE  10/31/2018 1029   LEUKOCYTESUR NEGATIVE 10/31/2018 1029   LEUKOCYTESUR Negative 11/03/2013 1527   Sepsis Labs: Invalid input(s): "PROCALCITONIN", "LACTICIDVEN"  Microbiology: No results found for this or any previous visit (from the past 240 hour(s)).  Radiology Studies: US Venous Img Lower Bilateral (DVT)  Result Date: 02/18/2022 CLINICAL DATA:  [id number] Leg swelling [id number] Leg pain 99497 EXAM: BILATERAL LOWER EXTREMITY VENOUS DOPPLER ULTRASOUND TECHNIQUE: Gray-scale sonography with compression, as well as color and duplex ultrasound, were performed to evaluate the deep venous system(s) from the level of the common femoral vein through the popliteal and proximal calf veins. COMPARISON:  None Available. FINDINGS: VENOUS Normal compressibility of the common femoral, superficial femoral, and popliteal veins, as well as the visualized calf veins. Visualized portions of profunda femoral vein and great saphenous vein unremarkable. No filling defects to suggest DVT on grayscale or color Doppler imaging. Doppler waveforms show normal direction of venous flow, normal respiratory plasticity and response to augmentation. OTHER None. Limitations: none IMPRESSION: Negative. Electronically Signed   By: Lucrezia Europe M.D.   On: 02/18/2022 10:21   US Abdomen Limited RUQ (LIVER/GB)  Result Date: 02/18/2022 CLINICAL DATA:  Abdominal pain.  Elevated liver enzymes. EXAM: ULTRASOUND ABDOMEN LIMITED RIGHT UPPER QUADRANT COMPARISON:  CT abdomen and pelvis 02/17/2022 FINDINGS: Gallbladder: Surgically absent. Common bile duct: Diameter: 9 mm proximally. The mid and distal common bile duct was obscured by bowel gas, precluding assessment of the possible filling defect noted on CT. Liver: Mildly increased parenchymal echogenicity diffusely. Slightly irregular liver contour. No focal liver lesion identified. Portal vein is patent on color Doppler imaging with normal direction of blood flow towards the liver. Other: None.  IMPRESSION: 1. Prior cholecystectomy. Mid to distal common bile duct obscured by bowel gas. 2. Mildly echogenic liver, nonspecific though can be seen with steatosis, infiltrative processes, and chronic hepatitis (with early cirrhosis not excluded given slightly nodular liver contour). Electronically Signed   By: Logan Bores M.D.   On: 02/18/2022 10:17   ECHOCARDIOGRAM COMPLETE  Result Date: 02/18/2022    ECHOCARDIOGRAM REPORT   Patient Name:   BRYAN OMURA Date of Exam: 02/17/2022 Medical Rec #:  [id number]    Height:       66.5 in Accession #:    [id number]   Weight:       200.0 lb Date of Birth:  Sep 02, 1930    BSA:          2.011 m Patient Age:    70 years     BP:           157/88 mmHg Patient Gender: F            HR:  99 bpm. Exam Location:  [facility] Procedure: 2D Echo, Cardiac Doppler and Color Doppler Indications:     R07.9 Chest pain  History:         Patient has prior history of Echocardiogram examinations, most                  recent 09/24/2021. CAD, Prior CABG, TIA, Arrythmias:PVC; Risk                  Factors:Hypertension and Dyslipidemia.  Sonographer:     Cresenciano Lick RDCS Referring Phys:  [id number] AMY N COX Diagnosing Phys: Harrell Gave End MD  Sonographer Comments: Technically difficult study due to poor echo windows. IMPRESSIONS  1. Left ventricular ejection fraction, by estimation, is 60 to 65%. The left ventricle has normal function. The left ventricle has no regional wall motion abnormalities. Left ventricular diastolic parameters are consistent with Grade I diastolic dysfunction (impaired relaxation). Elevated left atrial pressure.  2. Right ventricular systolic function is normal. The right ventricular size is normal. There is normal pulmonary artery systolic pressure.  3. The mitral valve is degenerative. Mild to moderate mitral valve regurgitation. Mild mitral stenosis.  4. The aortic valve was not well visualized. There is mild calcification of the aortic valve. There is  moderate thickening of the aortic valve. Aortic valve regurgitation is not visualized. Aortic valve sclerosis/calcification is present, without any evidence of aortic stenosis.  5. The inferior vena cava is normal in size with greater than 50% respiratory variability, suggesting right atrial pressure of 3 mmHg. FINDINGS  Left Ventricle: Left ventricular ejection fraction, by estimation, is 60 to 65%. The left ventricle has normal function. The left ventricle has no regional wall motion abnormalities. The left ventricular internal cavity size was normal in size. There is  no left ventricular hypertrophy. Left ventricular diastolic parameters are consistent with Grade I diastolic dysfunction (impaired relaxation). Elevated left atrial pressure. Right Ventricle: The right ventricular size is normal. No increase in right ventricular wall thickness. Right ventricular systolic function is normal. There is normal pulmonary artery systolic pressure. The tricuspid regurgitant velocity is 2.12 m/s, and  with an assumed right atrial pressure of 3 mmHg, the estimated right ventricular systolic pressure is 23.7 mmHg. Left Atrium: Left atrial size was normal in size. Right Atrium: Right atrial size was normal in size. Pericardium: There is no evidence of pericardial effusion. Mitral Valve: The mitral valve is degenerative in appearance. There is mild thickening of the mitral valve leaflet(s). There is mild calcification of the mitral valve leaflet(s). Mild to moderate mitral annular calcification. Mild to moderate mitral valve regurgitation. Mild mitral valve stenosis. The mean mitral valve gradient is 5.9 mmHg. Tricuspid Valve: The tricuspid valve is not well visualized. Tricuspid valve regurgitation is mild. Aortic Valve: The aortic valve was not well visualized. There is mild calcification of the aortic valve. There is moderate thickening of the aortic valve. Aortic valve regurgitation is not visualized. Aortic valve  sclerosis/calcification is present, without any evidence of aortic stenosis. Pulmonic Valve: The pulmonic valve was not well visualized. Pulmonic valve regurgitation is not visualized. No evidence of pulmonic stenosis. Aorta: The aortic root was not well visualized. Pulmonary Artery: The pulmonary artery is not well seen. Venous: The inferior vena cava is normal in size with greater than 50% respiratory variability, suggesting right atrial pressure of 3 mmHg. IAS/Shunts: No atrial level shunt detected by color flow Doppler.  LEFT VENTRICLE PLAX 2D LVIDd:  4.00 cm   Diastology LVIDs:         2.50 cm   LV e' medial:    5.55 cm/s LV PW:         1.00 cm   LV E/e' medial:  20.2 LV IVS:        0.70 cm   LV e' lateral:   8.27 cm/s LVOT diam:     1.30 cm   LV E/e' lateral: 13.5 LV SV:         27 LV SV Index:   13 LVOT Area:     1.33 cm  RIGHT VENTRICLE             IVC RV Basal diam:  3.90 cm     IVC diam: 1.40 cm RV S prime:     11.40 cm/s TAPSE (M-mode): 1.8 cm LEFT ATRIUM             Index        RIGHT ATRIUM           Index LA diam:        3.50 cm 1.74 cm/m   RA Area:     12.80 cm LA Vol (A2C):   49.2 ml 24.47 ml/m  RA Volume:   28.30 ml  14.07 ml/m LA Vol (A4C):   42.2 ml 20.98 ml/m LA Biplane Vol: 47.0 ml 23.37 ml/m  AORTIC VALVE LVOT Vmax:   114.33 cm/s LVOT Vmean:  79.200 cm/s LVOT VTI:    0.204 m MITRAL VALVE                TRICUSPID VALVE MV Area (PHT): 3.68 cm     TR Peak grad:   18.0 mmHg MV Area VTI:   0.97 cm     TR Vmax:        212.00 cm/s MV Mean grad:  5.9 mmHg MV VTI:        0.28 m       SHUNTS MV Decel Time: 206 msec     Systemic VTI:  0.20 m MV E velocity: 112.00 cm/s  Systemic Diam: 1.30 cm MV A velocity: 151.00 cm/s MV E/A ratio:  0.74 Harrell Gave End MD Electronically signed by Nelva Bush MD Signature Date/Time: 02/18/2022/7:06:15 AM    Final    CT Angio Chest PE W/Cm &/Or Wo Cm  Addendum Date: 02/17/2022   ADDENDUM REPORT: 02/17/2022 16:48 ADDENDUM: Imaging findings were  discussed with Dr. Quentin Cornwall of the emergency department rather than Dr. Charna Archer as was outlined in the previous addendum. Nodules in the RIGHT lung base while favored to be infectious or inflammatory follow-up imaging is suggested to ensure resolution in this patient with history of lung cancer. This was also relayed via telephone, by me, at the time discussion. Electronically Signed   By: Zetta Bills M.D.   On: 02/17/2022 16:48   Addendum Date: 02/17/2022   ADDENDUM REPORT: 02/17/2022 16:11 ADDENDUM: These results were called by telephone at the time of interpretation on 02/17/2022 at 4:01 pm to provider [facility] , who verbally acknowledged these results. Electronically Signed   By: Zetta Bills M.D.   On: 02/17/2022 16:11   Result Date: 02/17/2022 CLINICAL DATA:  Abdominal pain.  History of RIGHT lung cancer. * Tracking Code: BO * EXAM: CT ANGIOGRAPHY CHEST WITH CONTRAST TECHNIQUE: Multidetector CT imaging of the chest was performed using the standard protocol during bolus administration of intravenous contrast. Multiplanar CT image reconstructions and MIPs were obtained to evaluate  the vascular anatomy. RADIATION DOSE REDUCTION: This exam was performed according to the departmental dose-optimization program which includes automated exposure control, adjustment of the mA and/or kV according to patient size and/or use of iterative reconstruction technique. CONTRAST:  135m OMNIPAQUE IOHEXOL 350 MG/ML SOLN COMPARISON:  November of 2019. FINDINGS: Cardiovascular: Calcified aortic atherosclerotic changes. Signs of median sternotomy. Dense material along the margin of the ascending thoracic aorta near the proximal arch related to dystrophic calcification or pledget material the setting of prior surgery. No signs of aortic dilation. Irregularity of ascending thoracic aorta with subtle suspected focal dissection perhaps related to prior aortic manipulation without substantial change as a subtle abnormality on  prior imaging (image 172/4) Heart size mildly enlarged. Three-vessel coronary artery calcification and signs of prior bypass. Study optimized for evaluation of pulmonary vascular bed which is opacified to between 490 and 600 Hounsfield units. Study is negative for pulmonary embolism. Mediastinum/Nodes: Mildly patulous esophagus. No thoracic inlet adenopathy. No axillary lymphadenopathy. No hilar adenopathy. Lungs/Pleura: Signs of pleural thickening along the posteromedial RIGHT chest following RIGHT lower lobectomy and along the anterolateral RIGHT chest with mild pleural thickening associated with calcification likely related to postoperative and post treatment changes. Trace pleural effusion at the RIGHT lung base is similar to prior imaging. No pneumothorax. No signs of dense consolidative process. Subtle tree in bud in the posterior RIGHT upper lobe (image 66/3) this is stable compared to previous imaging. Tree-in-bud opacities are also noted at the RIGHT lung base which are new compared to previous imaging in the largest area of nodularity is a discrete nodule along the pleural surface measuring approximately 5 x 7 mm (image 111/3) no additional signs of new nodule. There are other small nodules along the pleural surface of the RIGHT lung. Upper Abdomen: Incidental imaging of upper abdominal contents with signs of pneumobilia. No acute upper abdominal findings. See dedicated abdominal report for further details. Abdominal contents Musculoskeletal: Spinal degenerative changes without acute or destructive bone process. Review of the MIP images confirms the above findings. IMPRESSION: 1. Study negative for pulmonary embolism. 2. This non gated study shows no definitive signs of opacification of the RIGHT coronary graft. Chronicity is uncertain but perhaps there was some contrast in the graft on previous imaging from 2021. Correlate with cardiac enzymes. 3. Signs of pleural thickening along the posteromedial RIGHT  chest following RIGHT lower lobectomy and along the anterolateral RIGHT chest with mild pleural thickening associated with calcification likely related to postoperative and post treatment changes. Trace pleural effusion at the RIGHT lung base is similar to prior imaging. 4. Tree-in-bud opacities are noted at the RIGHT lung base which are new compared to previous imaging in the largest area of nodularity is a discrete nodule along the pleural surface measuring approximately 5 x 7 mm. Findings are concerning for infectious or inflammatory process. 5. Irregularity of ascending thoracic aorta with subtle suspected focal dissection perhaps related to prior aortic manipulation without substantial change as a subtle abnormality on prior imaging. Consider follow-up at 3-6 months to ensure stability but lack of stranding and thickening in this area and presence on prior imaging without change since 2021 6. Three-vessel coronary artery calcification and signs of prior bypass. 7. Aortic atherosclerosis. Aortic Atherosclerosis (ICD10-I70.0). A call is out to the referring provider to further discuss findings in the above case. Electronically Signed: By: GZetta BillsM.D. On: 02/17/2022 15:47   CT Abdomen Pelvis W Contrast  Result Date: 02/17/2022 CLINICAL DATA:  Abdominal pain in a  patient with history of lung cancer. Pain is radiating to the chest by report. * Tracking Code: BO * EXAM: CT ABDOMEN AND PELVIS WITH CONTRAST TECHNIQUE: Multidetector CT imaging of the abdomen and pelvis was performed using the standard protocol following bolus administration of intravenous contrast. RADIATION DOSE REDUCTION: This exam was performed according to the departmental dose-optimization program which includes automated exposure control, adjustment of the mA and/or kV according to patient size and/or use of iterative reconstruction technique. CONTRAST:  153m OMNIPAQUE IOHEXOL 350 MG/ML SOLN COMPARISON:  Abdominal imaging from 2019 in  prior your CP Z. FINDINGS: Lower chest: Signs of mild cardiac to moderate cardiac enlargement, see dedicated chest CT for further details regarding chest findings. Patchy nodularity at the RIGHT lung base dominant nodule approaching a cm in size. Hepatobiliary: Pneumobilia with mild peripheral peribiliary enhancement near the ampulla. CBD dilation is improved compared to 2019 at 13 mm as compared to 16 mm but there is a potential filling defect in the common bile duct measuring approximately 16 x 8 mm. No focal, suspicious hepatic lesion. Portal vein is patent. SMV is patent. Hepatic veins are patent. Pancreas: Mild atrophy of the pancreas without ductal dilation, inflammation or visible lesion. Spleen: Calcifications along the margin of the spleen, no focal lesion in the spleen. Spleen is not enlarged. Adrenals/Urinary Tract: Adrenal glands are normal. Symmetric renal enhancement without hydronephrosis or suspicious renal lesion. No thickening of the urinary bladder. No nephrolithiasis or ureteral calculi on this contrasted exam. Stomach/Bowel: Small hiatal hernia. No stranding adjacent to the stomach. No small bowel obstruction or inflammation. Appendix is not visible though there are no secondary signs that would suggest acute appendicitis. No signs of colonic inflammation. Vascular/Lymphatic: Aortic atherosclerosis. No sign of aneurysm. Smooth contour of the IVC. There is no gastrohepatic or hepatoduodenal ligament lymphadenopathy. No retroperitoneal or mesenteric lymphadenopathy. No pelvic sidewall lymphadenopathy. Reproductive: Post hysterectomy.  No ascites. Other: No ascites. Small nodule in the RIGHT mesorectum is not substantially changed compared to imaging from 2019 (image 77/4) this measures approximately 8 mm and is been present to some extent since 2013 perhaps related to prior inflammation. Musculoskeletal: No acute bone finding. No destructive bone process. Spinal degenerative changes. IMPRESSION:  1. Pneumobilia with mild peripheral peribiliary enhancement near the ampulla. Correlate with any clinical or laboratory evidence of cholangitis. There is also a potential filling defect which may represent tumefactive sludge or a biliary calculus in the mid common bile duct. MRCP could be considered as warranted for further evaluation. Biliary duct dilation is improved compared to more remote imaging from 2019. 2. Patchy nodularity at the RIGHT lung base dominant nodule approaching a cm in size. Correlate with any history of recent infection or aspiration. Given discrete nodule at the RIGHT lung base would suggest follow-up in 8-12 weeks to exclude persistence in this patient with lung cancer. 3. Small hiatal hernia. 4. Aortic atherosclerosis. Aortic Atherosclerosis (ICD10-I70.0). Electronically Signed   By: GZetta BillsM.D.   On: 02/17/2022 16:00      [place]eslin [place]. [place] 7[place]-7AM, please contact night-coverage [email or web address] 02/18/2022, 1:42 PM

## 2022-02-18 NOTE — Consult Note (Signed)
Pharmacy Antibiotic Note  [name] is a 89 y.o. female admitted on 02/17/2022 with  Cholangitis .  Pharmacy has been consulted for Zosyn dosing unitl ERCP.  Plan: Zosyn 3.375g IV q8h (4 hour infusion).  Height: 5' 6.5" (168.9 cm) Weight: 90.7 kg (200 lb) IBW/kg (Calculated) : 60.45  Temp (24hrs), Avg:98.1 F (36.7 C), Min:97.4 F (36.3 C), Max:98.6 F (37 C)  Recent Labs  Lab 02/17/22 1156 02/18/22 0325  WBC 5.2 5.0  CREATININE 1.02* 1.09*    Estimated Creatinine Clearance: 40.1 mL/min (A) (by C-G formula based on SCr of 1.09 mg/dL (H)).    Allergies  Allergen Reactions   Sertraline Hcl Nausea Only   Biaxin [Clarithromycin]     depressed   Celecoxib Other (See Comments)    Reaction: Unknown   Ciprofloxacin    Codeine Other (See Comments)    Reaction: Unknown   Ferrous Sulfate Other (See Comments)   Hydromorphone    Lisinopril     cough   Macrobid [Nitrofurantoin Monohyd Macro] Other (See Comments)    Reaction: Unknown   Oxycodone Hcl Other (See Comments)    Reaction: Unknown   Prednisone    Sertraline Hcl Other (See Comments)    Reaction: Unknown   Penicillins Rash    Reaction: Unknown    Antimicrobials this admission: 2/7 Zosyn >>    Dose adjustments this admission: n/a   Microbiology results:  Thank you for allowing pharmacy to be a part of this patient's care. Valoree Agent Rodriguez-Guzman PharmD, BCPS 02/18/2022 2:36 PM

## 2022-02-18 NOTE — Progress Notes (Signed)
Rounding Note    Patient Name: [name] Date of Encounter: 02/18/2022  [facility] Cardiologist: Ida Rogue, MD   Subjective   RUQ [place] this AM. She denies further chest/abdominal pain.   Inpatient Medications    Scheduled Meds:  carvedilol  3.125 mg Oral BID WC   famotidine  20 mg Oral Daily   heparin  5,000 Units Subcutaneous Q8H   multivitamin with minerals  1 tablet Oral Daily   Continuous Infusions:  lactated ringers 125 mL/hr at 02/18/22 0752   PRN Meds: acetaminophen **OR** acetaminophen, calcium carbonate, hydrALAZINE, metoprolol tartrate, morphine injection, nitroGLYCERIN, ondansetron (ZOFRAN) IV   Vital Signs    Vitals:   02/18/22 0012 02/18/22 0323 02/18/22 0447 02/18/22 0841  BP: 125/75 (!) 150/76 (!) 155/73 (!) 157/74  Pulse: 83 83 (!) 46 85  Resp: '18 16 16 20  '$ Temp: 98.6 F (37 C) 97.9 F (36.6 C) 98.4 F (36.9 C) 98.6 F (37 C)  TempSrc:    Oral  SpO2: 97% 96% 97% 98%  Weight:      Height:        Intake/Output Summary (Last 24 hours) at 02/18/2022 0914 Last data filed at 02/17/2022 1823 Gross per 24 hour  Intake 240 ml  Output --  Net 240 ml      02/17/2022   11:53 AM 01/23/2022    1:58 PM 10/17/2021   11:32 AM  Last 3 Weights  Weight (lbs) 200 lb 200 lb 201 lb 9.6 oz  Weight (kg) 90.719 kg 90.719 kg 91.445 kg      Telemetry    NSR, 1st degree AV block, PVCx - Personally Reviewed  ECG    No new - Personally Reviewed  Physical Exam   GEN: No acute distress.   Neck: No JVD Cardiac: RRR, no murmurs, rubs, or gallops.  Respiratory: Clear to auscultation bilaterally. GI: Soft, nontender, non-distended  MS: No edema; No deformity. Neuro:  Nonfocal  Psych: Normal affect   Labs    High Sensitivity Troponin:   Recent Labs  Lab 02/17/22 1156 02/17/22 1425  TROPONINIHS 12 15     Chemistry Recent Labs  Lab 02/17/22 1156 02/17/22 1425 02/18/22 0325  NA 138  --  140  K 3.8  --  3.6  CL 102  --  104  CO2 27   --  25  GLUCOSE 179*  --  109*  BUN 22  --  21  CREATININE 1.02*  --  1.09*  CALCIUM 9.4  --  9.2  PROT  --  7.6 6.0*  ALBUMIN  --  4.2 3.4*  AST  --  85* 43*  ALT  --  53* 36  ALKPHOS  --  111 83  BILITOT  --  1.8* 1.6*  GFRNONAA 53*  --  49*  ANIONGAP 9  --  11    Lipids No results for input(s): "CHOL", "TRIG", "HDL", "LABVLDL", "LDLCALC", "CHOLHDL" in the last 168 hours.  Hematology Recent Labs  Lab 02/17/22 1156 02/18/22 0325  WBC 5.2 5.0  RBC 4.36 3.86*  HGB 13.3 11.7*  HCT 40.2 35.0*  MCV 92.2 90.7  MCH 30.5 30.3  MCHC 33.1 33.4  RDW 12.4 12.6  PLT 174 158   Thyroid No results for input(s): "TSH", "FREET4" in the last 168 hours.  BNPNo results for input(s): "BNP", "PROBNP" in the last 168 hours.  DDimer No results for input(s): "DDIMER" in the last 168 hours.   Radiology  ECHOCARDIOGRAM COMPLETE  Result Date: 02/18/2022    ECHOCARDIOGRAM REPORT   Patient Name:   [name] Date of Exam: 02/17/2022 Medical Rec #:  [id number]    Height:       66.5 in Accession #:    [id number]   Weight:       200.0 lb Date of Birth:  1930-02-18    BSA:          2.011 m Patient Age:    89 years     BP:           157/88 mmHg Patient Gender: F            HR:           99 bpm. Exam Location:  [facility] Procedure: 2D Echo, Cardiac Doppler and Color Doppler Indications:     R07.9 Chest pain  History:         Patient has prior history of Echocardiogram examinations, most                  recent 09/24/2021. CAD, Prior CABG, TIA, Arrythmias:PVC; Risk                  Factors:Hypertension and Dyslipidemia.  Sonographer:     Cresenciano Lick RDCS Referring Phys:  [id number] AMY N COX Diagnosing Phys: Harrell Gave End MD  Sonographer Comments: Technically difficult study due to poor echo windows. IMPRESSIONS  1. Left ventricular ejection fraction, by estimation, is 60 to 65%. The left ventricle has normal function. The left ventricle has no regional wall motion abnormalities. Left ventricular diastolic  parameters are consistent with Grade I diastolic dysfunction (impaired relaxation). Elevated left atrial pressure.  2. Right ventricular systolic function is normal. The right ventricular size is normal. There is normal pulmonary artery systolic pressure.  3. The mitral valve is degenerative. Mild to moderate mitral valve regurgitation. Mild mitral stenosis.  4. The aortic valve was not well visualized. There is mild calcification of the aortic valve. There is moderate thickening of the aortic valve. Aortic valve regurgitation is not visualized. Aortic valve sclerosis/calcification is present, without any evidence of aortic stenosis.  5. The inferior vena cava is normal in size with greater than 50% respiratory variability, suggesting right atrial pressure of 3 mmHg. FINDINGS  Left Ventricle: Left ventricular ejection fraction, by estimation, is 60 to 65%. The left ventricle has normal function. The left ventricle has no regional wall motion abnormalities. The left ventricular internal cavity size was normal in size. There is  no left ventricular hypertrophy. Left ventricular diastolic parameters are consistent with Grade I diastolic dysfunction (impaired relaxation). Elevated left atrial pressure. Right Ventricle: The right ventricular size is normal. No increase in right ventricular wall thickness. Right ventricular systolic function is normal. There is normal pulmonary artery systolic pressure. The tricuspid regurgitant velocity is 2.12 m/s, and  with an assumed right atrial pressure of 3 mmHg, the estimated right ventricular systolic pressure is 46.5 mmHg. Left Atrium: Left atrial size was normal in size. Right Atrium: Right atrial size was normal in size. Pericardium: There is no evidence of pericardial effusion. Mitral Valve: The mitral valve is degenerative in appearance. There is mild thickening of the mitral valve leaflet(s). There is mild calcification of the mitral valve leaflet(s). Mild to moderate  mitral annular calcification. Mild to moderate mitral valve regurgitation. Mild mitral valve stenosis. The mean mitral valve gradient is 5.9 mmHg. Tricuspid Valve: The tricuspid valve is not well visualized. Tricuspid valve regurgitation  is mild. Aortic Valve: The aortic valve was not well visualized. There is mild calcification of the aortic valve. There is moderate thickening of the aortic valve. Aortic valve regurgitation is not visualized. Aortic valve sclerosis/calcification is present, without any evidence of aortic stenosis. Pulmonic Valve: The pulmonic valve was not well visualized. Pulmonic valve regurgitation is not visualized. No evidence of pulmonic stenosis. Aorta: The aortic root was not well visualized. Pulmonary Artery: The pulmonary artery is not well seen. Venous: The inferior vena cava is normal in size with greater than 50% respiratory variability, suggesting right atrial pressure of 3 mmHg. IAS/Shunts: No atrial level shunt detected by color flow Doppler.  LEFT VENTRICLE PLAX 2D LVIDd:         4.00 cm   Diastology LVIDs:         2.50 cm   LV e' medial:    5.55 cm/s LV PW:         1.00 cm   LV E/e' medial:  20.2 LV IVS:        0.70 cm   LV e' lateral:   8.27 cm/s LVOT diam:     1.30 cm   LV E/e' lateral: 13.5 LV SV:         27 LV SV Index:   13 LVOT Area:     1.33 cm  RIGHT VENTRICLE             IVC RV Basal diam:  3.90 cm     IVC diam: 1.40 cm RV S prime:     11.40 cm/s TAPSE (M-mode): 1.8 cm LEFT ATRIUM             Index        RIGHT ATRIUM           Index LA diam:        3.50 cm 1.74 cm/m   RA Area:     12.80 cm LA Vol (A2C):   49.2 ml 24.47 ml/m  RA Volume:   28.30 ml  14.07 ml/m LA Vol (A4C):   42.2 ml 20.98 ml/m LA Biplane Vol: 47.0 ml 23.37 ml/m  AORTIC VALVE LVOT Vmax:   114.33 cm/s LVOT Vmean:  79.200 cm/s LVOT VTI:    0.204 m MITRAL VALVE                TRICUSPID VALVE MV Area (PHT): 3.68 cm     TR Peak grad:   18.0 mmHg MV Area VTI:   0.97 cm     TR Vmax:        212.00 cm/s MV  Mean grad:  5.9 mmHg MV VTI:        0.28 m       SHUNTS MV Decel Time: 206 msec     Systemic VTI:  0.20 m MV E velocity: 112.00 cm/s  Systemic Diam: 1.30 cm MV A velocity: 151.00 cm/s MV E/A ratio:  0.74 Harrell Gave End MD Electronically signed by Nelva Bush MD Signature Date/Time: 02/18/2022/7:06:15 AM    Final    CT Angio Chest PE W/Cm &/Or Wo Cm  Addendum Date: 02/17/2022   ADDENDUM REPORT: 02/17/2022 16:48 ADDENDUM: Imaging findings were discussed with Dr. Quentin Cornwall of the emergency department rather than Dr. Charna Archer as was outlined in the previous addendum. Nodules in the RIGHT lung base while favored to be infectious or inflammatory follow-up imaging is suggested to ensure resolution in this patient with history of lung cancer. This was also relayed via telephone, by  me, at the time discussion. Electronically Signed   By: Zetta Bills M.D.   On: 02/17/2022 16:48   Addendum Date: 02/17/2022   ADDENDUM REPORT: 02/17/2022 16:11 ADDENDUM: These results were called by telephone at the time of interpretation on 02/17/2022 at 4:01 pm to provider [facility] , who verbally acknowledged these results. Electronically Signed   By: Zetta Bills M.D.   On: 02/17/2022 16:11   Result Date: 02/17/2022 CLINICAL DATA:  Abdominal pain.  History of RIGHT lung cancer. * Tracking Code: BO * EXAM: CT ANGIOGRAPHY CHEST WITH CONTRAST TECHNIQUE: Multidetector CT imaging of the chest was performed using the standard protocol during bolus administration of intravenous contrast. Multiplanar CT image reconstructions and MIPs were obtained to evaluate the vascular anatomy. RADIATION DOSE REDUCTION: This exam was performed according to the departmental dose-optimization program which includes automated exposure control, adjustment of the mA and/or kV according to patient size and/or use of iterative reconstruction technique. CONTRAST:  119m OMNIPAQUE IOHEXOL 350 MG/ML SOLN COMPARISON:  November of 2019. FINDINGS: Cardiovascular:  Calcified aortic atherosclerotic changes. Signs of median sternotomy. Dense material along the margin of the ascending thoracic aorta near the proximal arch related to dystrophic calcification or pledget material the setting of prior surgery. No signs of aortic dilation. Irregularity of ascending thoracic aorta with subtle suspected focal dissection perhaps related to prior aortic manipulation without substantial change as a subtle abnormality on prior imaging (image 172/4) Heart size mildly enlarged. Three-vessel coronary artery calcification and signs of prior bypass. Study optimized for evaluation of pulmonary vascular bed which is opacified to between 490 and 600 Hounsfield units. Study is negative for pulmonary embolism. Mediastinum/Nodes: Mildly patulous esophagus. No thoracic inlet adenopathy. No axillary lymphadenopathy. No hilar adenopathy. Lungs/Pleura: Signs of pleural thickening along the posteromedial RIGHT chest following RIGHT lower lobectomy and along the anterolateral RIGHT chest with mild pleural thickening associated with calcification likely related to postoperative and post treatment changes. Trace pleural effusion at the RIGHT lung base is similar to prior imaging. No pneumothorax. No signs of dense consolidative process. Subtle tree in bud in the posterior RIGHT upper lobe (image 66/3) this is stable compared to previous imaging. Tree-in-bud opacities are also noted at the RIGHT lung base which are new compared to previous imaging in the largest area of nodularity is a discrete nodule along the pleural surface measuring approximately 5 x 7 mm (image 111/3) no additional signs of new nodule. There are other small nodules along the pleural surface of the RIGHT lung. Upper Abdomen: Incidental imaging of upper abdominal contents with signs of pneumobilia. No acute upper abdominal findings. See dedicated abdominal report for further details. Abdominal contents Musculoskeletal: Spinal degenerative  changes without acute or destructive bone process. Review of the MIP images confirms the above findings. IMPRESSION: 1. Study negative for pulmonary embolism. 2. This non gated study shows no definitive signs of opacification of the RIGHT coronary graft. Chronicity is uncertain but perhaps there was some contrast in the graft on previous imaging from 2021. Correlate with cardiac enzymes. 3. Signs of pleural thickening along the posteromedial RIGHT chest following RIGHT lower lobectomy and along the anterolateral RIGHT chest with mild pleural thickening associated with calcification likely related to postoperative and post treatment changes. Trace pleural effusion at the RIGHT lung base is similar to prior imaging. 4. Tree-in-bud opacities are noted at the RIGHT lung base which are new compared to previous imaging in the largest area of nodularity is a discrete nodule along the pleural surface  measuring approximately 5 x 7 mm. Findings are concerning for infectious or inflammatory process. 5. Irregularity of ascending thoracic aorta with subtle suspected focal dissection perhaps related to prior aortic manipulation without substantial change as a subtle abnormality on prior imaging. Consider follow-up at 3-6 months to ensure stability but lack of stranding and thickening in this area and presence on prior imaging without change since 2021 6. Three-vessel coronary artery calcification and signs of prior bypass. 7. Aortic atherosclerosis. Aortic Atherosclerosis (ICD10-I70.0). A call is out to the referring provider to further discuss findings in the above case. Electronically Signed: By: Zetta Bills M.D. On: 02/17/2022 15:47   CT Abdomen Pelvis W Contrast  Result Date: 02/17/2022 CLINICAL DATA:  Abdominal pain in a patient with history of lung cancer. Pain is radiating to the chest by report. * Tracking Code: BO * EXAM: CT ABDOMEN AND PELVIS WITH CONTRAST TECHNIQUE: Multidetector CT imaging of the abdomen and  pelvis was performed using the standard protocol following bolus administration of intravenous contrast. RADIATION DOSE REDUCTION: This exam was performed according to the departmental dose-optimization program which includes automated exposure control, adjustment of the mA and/or kV according to patient size and/or use of iterative reconstruction technique. CONTRAST:  148m OMNIPAQUE IOHEXOL 350 MG/ML SOLN COMPARISON:  Abdominal imaging from 2019 in prior your CP Z. FINDINGS: Lower chest: Signs of mild cardiac to moderate cardiac enlargement, see dedicated chest CT for further details regarding chest findings. Patchy nodularity at the RIGHT lung base dominant nodule approaching a cm in size. Hepatobiliary: Pneumobilia with mild peripheral peribiliary enhancement near the ampulla. CBD dilation is improved compared to 2019 at 13 mm as compared to 16 mm but there is a potential filling defect in the common bile duct measuring approximately 16 x 8 mm. No focal, suspicious hepatic lesion. Portal vein is patent. SMV is patent. Hepatic veins are patent. Pancreas: Mild atrophy of the pancreas without ductal dilation, inflammation or visible lesion. Spleen: Calcifications along the margin of the spleen, no focal lesion in the spleen. Spleen is not enlarged. Adrenals/Urinary Tract: Adrenal glands are normal. Symmetric renal enhancement without hydronephrosis or suspicious renal lesion. No thickening of the urinary bladder. No nephrolithiasis or ureteral calculi on this contrasted exam. Stomach/Bowel: Small hiatal hernia. No stranding adjacent to the stomach. No small bowel obstruction or inflammation. Appendix is not visible though there are no secondary signs that would suggest acute appendicitis. No signs of colonic inflammation. Vascular/Lymphatic: Aortic atherosclerosis. No sign of aneurysm. Smooth contour of the IVC. There is no gastrohepatic or hepatoduodenal ligament lymphadenopathy. No retroperitoneal or mesenteric  lymphadenopathy. No pelvic sidewall lymphadenopathy. Reproductive: Post hysterectomy.  No ascites. Other: No ascites. Small nodule in the RIGHT mesorectum is not substantially changed compared to imaging from 2019 (image 77/4) this measures approximately 8 mm and is been present to some extent since 2013 perhaps related to prior inflammation. Musculoskeletal: No acute bone finding. No destructive bone process. Spinal degenerative changes. IMPRESSION: 1. Pneumobilia with mild peripheral peribiliary enhancement near the ampulla. Correlate with any clinical or laboratory evidence of cholangitis. There is also a potential filling defect which may represent tumefactive sludge or a biliary calculus in the mid common bile duct. MRCP could be considered as warranted for further evaluation. Biliary duct dilation is improved compared to more remote imaging from 2019. 2. Patchy nodularity at the RIGHT lung base dominant nodule approaching a cm in size. Correlate with any history of recent infection or aspiration. Given discrete nodule at the RIGHT lung  base would suggest follow-up in 8-12 weeks to exclude persistence in this patient with lung cancer. 3. Small hiatal hernia. 4. Aortic atherosclerosis. Aortic Atherosclerosis (ICD10-I70.0). Electronically Signed   By: Zetta Bills M.D.   On: 02/17/2022 16:00   DG Chest 2 View  Result Date: 02/17/2022 CLINICAL DATA:  cp EXAM: CHEST - 2 VIEW COMPARISON:  09/24/2021. FINDINGS: Cardiac silhouette is unremarkable. No pneumothorax or pleural effusion. The lungs are clear. Aorta ectatic and calcified. The visualized skeletal structures are unremarkable. Patient is status post median sternotomy and CABG. IMPRESSION: No acute cardiopulmonary process. Electronically Signed   By: Sammie Bench M.D.   On: 02/17/2022 12:38    Cardiac Studies   Echo 02/17/22 1. Left ventricular ejection fraction, by estimation, is 60 to 65%. The  left ventricle has normal function. The left  ventricle has no regional  wall motion abnormalities. Left ventricular diastolic parameters are  consistent with Grade I diastolic  dysfunction (impaired relaxation). Elevated left atrial pressure.   2. Right ventricular systolic function is normal. The right ventricular  size is normal. There is normal pulmonary artery systolic pressure.   3. The mitral valve is degenerative. Mild to moderate mitral valve  regurgitation. Mild mitral stenosis.   4. The aortic valve was not well visualized. There is mild calcification  of the aortic valve. There is moderate thickening of the aortic valve.  Aortic valve regurgitation is not visualized. Aortic valve  sclerosis/calcification is present, without any  evidence of aortic stenosis.   5. The inferior vena cava is normal in size with greater than 50%  respiratory variability, suggesting right atrial pressure of 3 mmHg.   TTE (09/24/2021):  1. Left ventricular ejection fraction, by estimation, is 60 to 65%. The  left ventricle has normal function. The left ventricle has no regional  wall motion abnormalities. There is mild left ventricular hypertrophy.  Left ventricular diastolic parameters  are consistent with Grade I diastolic dysfunction (impaired relaxation).   2. Right ventricular systolic function is normal. The right ventricular  size is normal. There is normal pulmonary artery systolic pressure. The  estimated right ventricular systolic pressure is 00.3 mmHg.   3. The mitral valve is normal in structure. Moderate mitral valve  regurgitation. No evidence of mitral stenosis.   4. Tricuspid valve regurgitation is moderate.   5. The aortic valve has an indeterminant number of cusps. Aortic valve  regurgitation is not visualized. No aortic stenosis is present. Aortic  valve mean gradient measures 7.5 mmHg.   6. The inferior vena cava is normal in size with greater than 50%  respiratory variability, suggesting right atrial pressure of 3 mmHg.      Patient Profile     89 y.o. female with h/o CAD s/p PCI and subsequent CABG in 2018 (LIMA-LAD, SVG-OM, SVG-RPDA), TIA, bilateral carotid artery disease, HTN, HLD, and anxiety who is being seen for chest pain.   Assessment & Plan   Atypical chest pain CAD - patient reported epigastric pain that occurred after eating. No exertional pain reported.  - HS trop negative.  - IV heparin not started - Echo showed LVEF 60-65%, no WMA, G1DD, normal RVSF, normal pulmonary artery systolic pressure, mild to mod MR. - suspect his may be GI in nature - continue medical therapy with coreg  Abdominal pain - possibly 2/2 cholangitis - LFTs mildly elevated - [place] RUQ this AM - per IM/GI  HTN - BP mildly elevated, can restart PTA amlodipine - continue Coreg  Chronic  HFpEF - euvolemic on exam - Echo showed normal LVEF - continue BB therapy  For questions or updates, please contact [place] Please consult [email or web address] for contact info under        Signed, Yasira Engelson Ninfa Meeker, PA-C  02/18/2022, 9:14 AM

## 2022-02-18 NOTE — Evaluation (Signed)
Physical Therapy Evaluation Patient Details Name: [name] MRN: [id number] DOB: 23-Nov-1930 Today's Date: 02/18/2022  History of Present Illness  Pt is an 89 year old female presenting to the ED with chest/abdominal pain; admitted fro management of possible choledocholithiasis (pending ERCP 02/19/22).  PMH significant for CAD s/p PCI and subsequent CABG in 2018 (LIMA-LAD, SVG-OM, SVG-RPDA), TIA, bilateral carotid artery disease, HTN, HLD, and anxiety  Clinical Impression  Patient resting in bed upon arrival, grandson at bedside.  Patient alert and oriented, follows commands and agreeable to participation with session.  Denies pain (in abdomen or chest) at rest and with exertion.  Bilat UE/LE strength and ROM grossly symmetrical and WFL; no focal weakness reported.  Able to complete bed mobility with min assist; sit/stand, basic transfers and gait (150') with RW, cga/close sup.  Demonstrates reciprocal stepping pattern with fair cadence; completes head turns, maintains conversation, completes turns without buckling, LOB. HR maintained 80s with gait; no reports of chest pain with gait progression.  Attempted trial of gait without RW, but patient politely declined, "the walker makes me go faster".  Will continue to progress/wean as tolerated. Would benefit from skilled PT to address above deficits and promote optimal return to PLOF.; Recommend transition to HHPT upon discharge from acute hospitalization.        Recommendations for follow up therapy are one component of a multi-disciplinary discharge planning process, led by the attending physician.  Recommendations may be updated based on patient status, additional functional criteria and insurance authorization.  Follow Up Recommendations Home health PT      Assistance Recommended at Discharge PRN  Patient can return home with the following  A little help with walking and/or transfers;A little help with bathing/dressing/bathroom    Equipment  Recommendations Rolling walker (2 wheels)  Recommendations for Other Services       Functional Status Assessment Patient has had a recent decline in their functional status and demonstrates the ability to make significant improvements in function in a reasonable and predictable amount of time.     Precautions / Restrictions Precautions Precautions: Fall Restrictions Weight Bearing Restrictions: No      Mobility  Bed Mobility Overal bed mobility: Needs Assistance Bed Mobility: Supine to Sit     Supine to sit: Min assist          Transfers Overall transfer level: Needs assistance Equipment used: Rolling walker (2 wheels) Transfers: Sit to/from Stand Sit to Stand: Min guard           General transfer comment: cuing for hand placement to prevent pulling on RW    Ambulation/Gait Ambulation/Gait assistance: Min guard, Supervision Gait Distance (Feet): 150 Feet Assistive device: Rolling walker (2 wheels)         General Gait Details: reciprocal stepping pattern with fair cadence; completes head turns, maintains conversation, completes turns without buckling, LOB. HR maintained 80s with gait; no reports of chest pain with gait progression  Stairs            Wheelchair Mobility    Modified Rankin (Stroke Patients Only)       Balance Overall balance assessment: Needs assistance Sitting-balance support: No upper extremity supported, Feet supported Sitting balance-Leahy Scale: Good     Standing balance support: Bilateral upper extremity supported Standing balance-Leahy Scale: Fair                               Pertinent Vitals/Pain Pain Assessment  Pain Assessment: No/denies pain    Home Living Family/patient expects to be discharged to:: Private residence Living Arrangements: Children Available Help at Discharge: Family;Available PRN/intermittently Type of Home: House Home Access: Stairs to enter Entrance Stairs-Rails:  Right;Left Entrance Stairs-Number of Steps: 4 Alternate Level Stairs-Number of Steps: flight Home Layout: Two level;Able to live on main level with bedroom/bathroom Home Equipment: Shower seat;Rolling Walker (2 wheels);Cane - single point;Grab bars - toilet;Grab bars - tub/shower Additional Comments: grab bars in tub/shower upstairs, grab bars on primary toilet (half bath) downstairs    Prior Function Prior Level of Function : Needs assist       Physical Assist : ADLs (physical)   ADLs (physical): IADLs Mobility Comments: amb with no AD, no recent falls; has used RW in the past few months due to generalized weakness or will hold on to grandson ADLs Comments: MOD I feeding,grooming, bathing, dressing, toileting; assist for IADLs due to worsening macular degeneration and weakness (someone drives her to appts, assist for cooking/laundry)     Hand Dominance        Extremity/Trunk Assessment   Upper Extremity Assessment Upper Extremity Assessment: Generalized weakness    Lower Extremity Assessment Lower Extremity Assessment: Generalized weakness (grossly at least 4/5 throughout)       Communication   Communication: No difficulties  Cognition Arousal/Alertness: Awake/alert Behavior During Therapy: WFL for tasks assessed/performed Overall Cognitive Status: Within Functional Limits for tasks assessed                                          General Comments General comments (skin integrity, edema, etc.): HR up to 91 with amb    Exercises Other Exercises Other Exercises: Reviewed role of PT and mobility goals: OOB to chair for meals, ambulation 3x/day with family/staff.  Patient/grandson voiced understanding.   Assessment/Plan    PT Assessment Patient needs continued PT services  PT Problem List Decreased activity tolerance;Decreased balance;Decreased mobility;Decreased knowledge of use of DME;Decreased safety awareness;Decreased knowledge of precautions        PT Treatment Interventions DME instruction;Gait training;Stair training;Functional mobility training;Therapeutic activities;Therapeutic exercise;Balance training;Patient/family education    PT Goals (Current goals can be found in the Care Plan section)  Acute Rehab PT Goals Patient Stated Goal: to go home PT Goal Formulation: With patient/family Time For Goal Achievement: 03/04/22 Potential to Achieve Goals: Good    Frequency Min 2X/week     Co-evaluation               AM-PAC PT "6 Clicks" Mobility  Outcome Measure Help needed turning from your back to your side while in a flat bed without using bedrails?: None Help needed moving from lying on your back to sitting on the side of a flat bed without using bedrails?: None Help needed moving to and from a bed to a chair (including a wheelchair)?: A Little Help needed standing up from a chair using your arms (e.g., wheelchair or bedside chair)?: A Little Help needed to walk in hospital room?: A Little Help needed climbing 3-5 steps with a railing? : A Little 6 Click Score: 20    End of Session Equipment Utilized During Treatment: Gait belt Activity Tolerance: Patient tolerated treatment well Patient left: in bed;with call bell/phone within reach;with bed alarm set Nurse Communication: Mobility status PT Visit Diagnosis: Muscle weakness (generalized) (M62.81);Difficulty in walking, not elsewhere classified (R26.2)  Time: 2671-2458 PT Time Calculation (min) (ACUTE ONLY): 15 min   Charges:   PT Evaluation $PT Eval Low Complexity: 1 Low          Angelina Neece H. Owens Shark, PT, DPT, NCS 02/18/22, 3:07 PM [phone]

## 2022-02-18 NOTE — Progress Notes (Signed)
Patient taken off the floor via bed to have MRCP. On RA, IV  saline locked. No distress noted

## 2022-02-18 NOTE — Evaluation (Addendum)
Occupational Therapy Evaluation Patient Details Name: [name] MRN: [id number] DOB: [birth year] Today's Date: 02/18/2022   History of Present Illness Pt is an 89 year old female presenting to the ED with chest pain; PMH significant for CAD s/p PCI and subsequent CABG in 2018 (LIMA-LAD, SVG-OM, SVG-RPDA), TIA, bilateral carotid artery disease, HTN, HLD, and anxiety   Clinical Impression   Chart reviewed, pt greeted in chair agreeable to OT evaluation. Pt is alert and oriented x4. PTA pt is MOD I in ADL, assist for IADLs due to worsening vision/weakness, amb with no AD with no falls however reaching outward or holds onto grandson to amb around house. Pt presents with deficits in strength, endurence, activity tolerance, balance affecting safe and optimal ADL completion. Pt amb into bathroom and 100' in hallway with CGA-MIN A, toileting with supervision, grooming tasks in standing with supervision. Discussed course of HHOT to address acute weakness and also assist with potential environmental mods for worsening vision changes. Also educated pt on importance of continued mobility while acutely hospitalized. Pt is left in bed, all needs met, RN in room. OT will continue to follow acutely.    Of note: Hr up to 91 Bpm with mobility    Recommendations for follow up therapy are one component of a multi-disciplinary discharge planning process, led by the attending physician.  Recommendations may be updated based on patient status, additional functional criteria and insurance authorization.   Follow Up Recommendations  Home health OT     Assistance Recommended at Discharge Intermittent Supervision/Assistance  Patient can return home with the following A little help with bathing/dressing/bathroom;A little help with walking and/or transfers;Assistance with cooking/housework;Direct supervision/assist for medications management;Help with stairs or ramp for entrance    Functional Status Assessment  Patient  has had a recent decline in their functional status and demonstrates the ability to make significant improvements in function in a reasonable and predictable amount of time.  Equipment Recommendations  None recommended by OT    Recommendations for Other Services       Precautions / Restrictions Precautions Precautions: Fall Restrictions Weight Bearing Restrictions: No      Mobility Bed Mobility Overal bed mobility: Needs Assistance Bed Mobility: Sit to Supine       Sit to supine: Supervision, HOB elevated        Transfers Overall transfer level: Needs assistance Equipment used: None Transfers: Sit to/from Stand Sit to Stand: Supervision, Min guard                  Balance Overall balance assessment: Needs assistance Sitting-balance support: Feet supported Sitting balance-Leahy Scale: Good     Standing balance support: Single extremity supported Standing balance-Leahy Scale: Fair                             ADL either performed or assessed with clinical judgement   ADL Overall ADL's : Needs assistance/impaired Eating/Feeding: Set up;Sitting   Grooming: Wash/dry hands;Standing;Supervision/safety           Upper Body Dressing : Minimal assistance;Sitting   Lower Body Dressing: Minimal assistance Lower Body Dressing Details (indicate cue type and reason): anticipated Toilet Transfer: Supervision/safety;Min guard;Grab Haematologist;Ambulation   Toileting- Clothing Manipulation and Hygiene: Supervision/safety;Sitting/lateral lean       Functional mobility during ADLs: Min guard;Minimal assistance (approx 100', intermittment HHA/holding onto IV pole for support)       Vision Baseline Vision/History: 6 Macular Degeneration Patient Visual  Report: No change from baseline Additional Comments: educated pt on further rehab for adaptations regarding vision deficits     Perception     Praxis      Pertinent Vitals/Pain Pain  Assessment Pain Assessment: No/denies pain     Hand Dominance     Extremity/Trunk Assessment Upper Extremity Assessment Upper Extremity Assessment: Generalized weakness   Lower Extremity Assessment Lower Extremity Assessment: Generalized weakness       Communication Communication Communication: No difficulties   Cognition Arousal/Alertness: Awake/alert Behavior During Therapy: WFL for tasks assessed/performed Overall Cognitive Status: Within Functional Limits for tasks assessed                                       General Comments  HR up to 91 with amb    Exercises Other Exercises Other Exercises: edu pt re: role of OT, role of rehab, discharge recommendations, home safety, falls prevention, DME use   Shoulder Instructions      Home Living Family/patient expects to be discharged to:: Private residence Living Arrangements: Children (son, daughter in law, adult grand son) Available Help at Discharge: Family;Available PRN/intermittently Type of Home: House Home Access: Stairs to enter [employer] of Steps: 4 Entrance Stairs-Rails: Right;Left Home Layout: Two level;Able to live on main level with bedroom/bathroom Alternate Level Stairs-Number of Steps: flight Alternate Level Stairs-Rails: Right;Left Bathroom Shower/Tub: Tub/shower unit         Home Equipment: Advice worker (2 wheels);Cane - single point;Grab bars - toilet;Grab bars - tub/shower   Additional Comments: grab bars in tub/shower upstairs, grab bars on primary toilet (half bath) downstairs      Prior Functioning/Environment Prior Level of Function : Needs assist       Physical Assist : ADLs (physical)   ADLs (physical): IADLs Mobility Comments: amb with no AD, no recent falls; has used RW in the past few months due to generalized weakness or will hold on to grandson ADLs Comments: MOD I feeding,grooming, bathing, dressing, toileting; assist for IADLs due to  worsening macular degeneration and weakness (someone drives her to appts, assist for cooking/laundry)        OT Problem List: Decreased strength;Decreased activity tolerance;Impaired balance (sitting and/or standing);Decreased knowledge of use of DME or AE      OT Treatment/Interventions: Self-care/ADL training;Therapeutic exercise;Patient/family education;Balance training;Therapeutic activities;DME and/or AE instruction    OT Goals(Current goals can be found in the care plan section) Acute Rehab OT Goals Patient Stated Goal: get stronger OT Goal Formulation: With patient Time For Goal Achievement: 03/04/22 Potential to Achieve Goals: Good ADL Goals Pt Will Perform Grooming: with modified independence;standing;sitting Pt Will Perform Lower Body Dressing: with modified independence Pt Will Transfer to Toilet: with modified independence Pt Will Perform Toileting - Clothing Manipulation and hygiene: with modified independence  OT Frequency: Min 2X/week    Co-evaluation              AM-PAC OT "6 Clicks" Daily Activity     Outcome Measure Help from another person eating meals?: None Help from another person taking care of personal grooming?: None Help from another person toileting, which includes using toliet, bedpan, or urinal?: None Help from another person bathing (including washing, rinsing, drying)?: A Little Help from another person to put on and taking off regular upper body clothing?: None Help from another person to put on and taking off regular lower body clothing?: A Little  6 Click Score: 22   End of Session Nurse Communication: Mobility status  Activity Tolerance: Patient tolerated treatment well Patient left: in bed;with call bell/phone within reach;with bed alarm set;with family/visitor present;with nursing/sitter in room  OT Visit Diagnosis: Other abnormalities of gait and mobility (R26.89);Muscle weakness (generalized) (M62.81)                Time:  1505-6979 OT Time Calculation (min): 23 min Charges:  OT General Charges $OT Visit: 1 Visit OT Evaluation $OT Eval Low Complexity: 1 Low  Shanon Payor, OTD OTR/L  02/18/22, 2:49 PM

## 2022-02-18 NOTE — Consult Note (Signed)
Consultation  Referring Provider:     Hospitalist Admit date: 02/17/2022 Consult date: 02/18/2022        Reason for Consultation:    Abnormal imaging          HPI:   [name] is a 89 y.o. lady with history of lung cancer, hypertension, and history of choledocholithiasis here with sudden onset of epigastric pain that has since resolved. Patient had cholecystectomy in 2004 with IOC and subsequent ERCP with sphincterotomy. Patient had recurrence of choledocholithiasis in 2019 with repeat ERCP and extension of the sphincterotomy. On CT scan here, there is concern for choledocholithiasis which I personally spoke with radiology who confirmed suspicion for stones in the mid CBD and at the ampulla. Patient is unable to undergo MRCP due to concern for metal in her inner ear from a surgery many years ago. Patient is no longer on any blood thinners. Her LFT's are essentially normal besides mild elevation in indirect bilirubin consistent with Gilbert's syndrome.  No history of weight loss surgery.   Past Medical History:  Diagnosis Date   Anxiety    Atypical carcinoid lung tumor ([place])    CAD (coronary artery disease)    a. 1986: s/p PTCA of bifurcation lesion in LAD;  b. 2001: s/p BMS to LAD & LCX;  c. 04/2006 Cath: diff CAD w/o critical narrowing, nl EF;  d. 12/2014 MV: inflat ST dep, mild basal inf ischemia, EF 55-65%-->Low risk; e. 12/2016 - 3V CABG.   Cancer [facility]) yrs ago her pt   right lobe of lung. surgery only.    Carotid disease, bilateral ([place])    a. 08/2015 Carotid U/S: < 39% bilat ICA stenosis.   Cataracts, bilateral    Cholangitis    Choledocholithiasis    Cholelithiases    Chronic diarrhea    Depression    Frequent PVCs    Gilbert's syndrome    Headache    PMH: migraines   History of echocardiogram    a. 06/2010 Echo: EF 60-65%, mild AI/MR; b. 10/2015 Echo: EF 60-65%, no rwma, mild MR, nl RV fxn.   HTN (hypertension)    Hyperlipidemia    Impaired fasting blood sugar    Keloid     mid sternum   Macular degeneration    Osteoarthritis    Pneumonia    PONV (postoperative nausea and vomiting)    Positional vertigo    benign   Postoperative atrial fibrillation ([place]) 12/2016   S/P CABG x 3 12/23/2016   LIMA to LAD, SVG to OM, SVG to PDA, EVH via left thigh   TIA (transient ischemic attack)    Wears glasses    Wears hearing aid     Past Surgical History:  Procedure Laterality Date   ABDOMINAL AORTOGRAM N/A 12/22/2016   Procedure: ABDOMINAL AORTOGRAM;  Surgeon: Nelva Bush, MD;  Location: [place] [facility];  Service: Cardiovascular;  Laterality: N/A;   ABDOMINAL HYSTERECTOMY     BILIARY STENT PLACEMENT  12/10/2017   Procedure: BILIARY STENT PLACEMENT;  Surgeon: Irving Copas., MD;  Location: [place];  Service: Gastroenterology;;   CHOLECYSTECTOMY     CORONARY ARTERY BYPASS GRAFT N/A 12/23/2016   Procedure: CORONARY ARTERY BYPASS GRAFTING (CABG) TIMES THREE USING LEFT INTERNAL MAMMARY ARTERY AND LEFT SAPHENOUS LEG VEIN HARVESTED ENDOSCOPICALLY,;  Surgeon: Rexene Alberts, MD;  Location: [place];  Service: Open Heart Surgery;  Laterality: N/A;   ENDOSCOPIC RETROGRADE CHOLANGIOPANCREATOGRAPHY (ERCP) WITH PROPOFOL N/A 02/21/2018   Procedure: ENDOSCOPIC RETROGRADE CHOLANGIOPANCREATOGRAPHY (  ERCP) WITH PROPOFOL;  Surgeon: Mansouraty, Telford Nab., MD;  Location: [place];  Service: Gastroenterology;  Laterality: N/A;   ERCP N/A 12/10/2017   Procedure: ENDOSCOPIC RETROGRADE CHOLANGIOPANCREATOGRAPHY (ERCP);  Surgeon: Irving Copas., MD;  Location: [place];  Service: Gastroenterology;  Laterality: N/A;   IABP INSERTION N/A 12/22/2016   Procedure: IABP Insertion;  Surgeon: Nelva Bush, MD;  Location: [place] [facility];  Service: Cardiovascular;  Laterality: N/A;   LEFT HEART CATH AND CORONARY ANGIOGRAPHY N/A 12/22/2016   Procedure: LEFT HEART CATH AND CORONARY ANGIOGRAPHY;  Surgeon: Nelva Bush, MD;  Location: [place] [facility]  [facility];  Service: Cardiovascular;  Laterality: N/A;   LEFT HEART CATHETERIZATION WITH CORONARY ANGIOGRAM N/A 04/27/2012   Procedure: LEFT HEART CATHETERIZATION WITH CORONARY ANGIOGRAM;  Surgeon: Peter M Martinique, MD;  Location: [facility] C[facility] LAB;  Service: Cardiovascular;  Laterality: N/A;   LOBECTOMY Right    RLL atypical carcinoid tumor - Dr Eugenia Mcalpine  1986   PCI of LAD  2001   with non des lad    pci of mid cfx     with non des and cutting balloon pci of av circumflex   REMOVAL OF STONES  02/21/2018   Procedure: REMOVAL OF STONES;  Surgeon: Irving Copas., MD;  Location: [place];  Service: Gastroenterology;;   Joan Mayans  02/21/2018   Procedure: Joan Mayans;  Surgeon: Irving Copas., MD;  Location: [place];  Service: Gastroenterology;;   Lavell Islam REMOVAL  02/21/2018   Procedure: STENT REMOVAL;  Surgeon: Irving Copas., MD;  Location: [place];  Service: Gastroenterology;;   TEE WITHOUT CARDIOVERSION N/A 12/23/2016   Procedure: TRANSESOPHAGEAL ECHOCARDIOGRAM (TEE);  Surgeon: Rexene Alberts, MD;  Location: [place];  Service: Open Heart Surgery;  Laterality: N/A;    Family History  Problem Relation Age of Onset   Diabetes Mellitus I Mother    CAD Mother    CAD Father    Diabetes Mellitus I Sister    Diabetes Mellitus I Sister    Diabetes Mellitus I Brother    Colon cancer Neg Hx    Esophageal cancer Neg Hx    Inflammatory bowel disease Neg Hx    Liver disease Neg Hx    Pancreatic cancer Neg Hx    Rectal cancer Neg Hx    Stomach cancer Neg Hx      Social History   Tobacco Use   Smoking status: Never   Smokeless tobacco: Never  Vaping Use   Vaping Use: Never used  Substance Use Topics   Alcohol use: No   Drug use: No    Prior to Admission medications   Medication Sig Start Date End Date Taking? Authorizing Provider  acetaminophen (TYLENOL) 325 MG tablet Take 2 tablets (650 mg total) by mouth every 6 (six) hours as needed for mild pain  or headache. 01/02/17   Barrett, Erin R, PA-C  amLODipine (NORVASC) 5 MG tablet Take 1 tablet (5 mg total) by mouth 2 (two) times daily. 1 tablet in the morning and half tablet at night. 10/08/20   Minna Merritts, MD  aspirin 81 MG tablet Take 81 mg by mouth daily.    [provider]  atorvastatin (LIPITOR) 40 MG tablet TAKE 1 TABLET BY MOUTH EVERY DAY 12/23/21   Minna Merritts, MD  calcium carbonate (TUMS EX) 750 MG chewable tablet Chew 2 tablets by mouth as needed for heartburn.    [provider]  Calcium Carbonate-Vitamin D (CALCIUM 600 +  D PO) Take 1 tablet by mouth daily. 600 mg/ 400 mg    [provider]  carvedilol (COREG) 12.5 MG tablet Take 0.5 tablet (6.25 mg) by mouth twice daily 12/15/21   [provider]  estradiol (ESTRACE) 0.5 MG tablet Take 0.5 mg by mouth daily. 10/04/19   [provider]  famotidine (PEPCID) 20 MG tablet Take 20 mg by mouth 2 (two) times daily.    [provider]  furosemide (LASIX) 40 MG tablet TAKE 1 TABLET BY MOUTH EVERY DAY 12/23/21   Gollan, Kathlene November, MD  KLOR-CON M20 20 MEQ tablet TAKE 1 TABLET BY MOUTH TWICE A DAY 02/11/22   Kate Sable, MD  LORazepam (ATIVAN) 0.5 MG tablet Take 1 tablet (0.5 mg total) by mouth every 8 (eight) hours as needed for anxiety. 01/02/17   Barrett, Erin R, PA-C  Multiple Vitamin (MULTIVITAMIN) tablet Take 1 tablet by mouth daily.    [provider]  nitroGLYCERIN (NITROSTAT) 0.4 MG SL tablet Place 1 tablet (0.4 mg total) under the tongue every 5 (five) minutes as needed for chest pain. 10/08/20   Minna Merritts, MD  telmisartan (MICARDIS) 80 MG tablet TAKE 1 TABLET BY MOUTH EVERY DAY 12/23/21   Minna Merritts, MD    Current Facility-Administered Medications  Medication Dose Route Frequency Provider Last Rate Last Admin   acetaminophen (TYLENOL) tablet 650 mg  650 mg Oral Q6H PRN Cox, Amy N, DO   650 mg at 02/18/22 0503   Or   acetaminophen (TYLENOL)  suppository 650 mg  650 mg Rectal Q6H PRN Cox, Amy N, DO       calcium carbonate (TUMS - dosed in mg elemental calcium) chewable tablet 400 mg of elemental calcium  2 tablet Oral PRN Cox, Amy N, DO       carvedilol (COREG) tablet 3.125 mg  3.125 mg Oral BID WC Cox, Amy N, DO   3.125 mg at 02/18/22 1046   famotidine (PEPCID) tablet 20 mg  20 mg Oral Daily Cox, Amy N, DO   20 mg at 02/18/22 1046   heparin injection 5,000 Units  5,000 Units Subcutaneous Q8H Cox, Amy N, DO   5,000 Units at 02/18/22 0503   hydrALAZINE (APRESOLINE) injection 5 mg  5 mg Intravenous Q8H PRN Cox, Amy N, DO       lactated ringers infusion   Intravenous Continuous Cox, Amy N, DO 125 mL/hr at 02/18/22 0752 New Bag at 02/18/22 0752   metoprolol tartrate (LOPRESSOR) injection 2.5 mg  2.5 mg Intravenous Q2H PRN Cox, Amy N, DO       morphine (PF) 2 MG/ML injection 2 mg  2 mg Intravenous Q4H PRN Cox, Amy N, DO       multivitamin with minerals tablet 1 tablet  1 tablet Oral Daily Cox, Amy N, DO   1 tablet at 02/18/22 1046   nitroGLYCERIN (NITROGLYN) 2 % ointment 1 inch  1 inch Topical Q6H PRN Cox, Amy N, DO       ondansetron (ZOFRAN) injection 4 mg  4 mg Intravenous Q6H PRN Cox, Amy N, DO        Allergies as of 02/17/2022 - Review Complete 02/17/2022  Allergen Reaction Noted   Sertraline hcl Nausea Only 12/08/2017   Biaxin [clarithromycin]  07/06/2013   Celecoxib Other (See Comments)    Ciprofloxacin  12/21/2016   Codeine Other (See Comments)    Ferrous sulfate Other (See Comments) 05/07/2021   Hydromorphone  12/21/2016  Lisinopril  07/06/2013   Macrobid [nitrofurantoin monohyd macro] Other (See Comments) 12/28/2014   Oxycodone hcl Other (See Comments)    Prednisone  12/21/2016   Sertraline hcl Other (See Comments)    Penicillins Rash      Review of Systems:    All systems reviewed and negative except where noted in HPI.  Review of Systems  Constitutional:  Negative for chills and fever.  HENT:  Negative for  hearing loss.   Respiratory:  Negative for shortness of breath.   Cardiovascular:  Negative for chest pain.  Gastrointestinal:  Negative for abdominal pain, blood in stool, constipation, melena, nausea and vomiting.  Musculoskeletal:  Negative for joint pain.  Neurological:  Negative for focal weakness.  Psychiatric/Behavioral:  Negative for substance abuse.   All other systems reviewed and are negative.     Physical Exam:  Vital signs in last 24 hours: Temp:  [97.4 F (36.3 C)-98.6 F (37 C)] 98.1 F (36.7 C) (02/07 1248) Pulse Rate:  [46-100] 83 (02/07 1248) Resp:  [16-20] 20 (02/07 1248) BP: (125-172)/(73-92) 149/92 (02/07 1248) SpO2:  [96 %-99 %] 98 % (02/07 1248)   General:   Pleasant in NAD Head:  Normocephalic and atraumatic. Eyes:   No icterus.   Conjunctiva pink. Mouth: Mucosa pink moist, no lesions. Neck:  Supple; no masses felt Lungs:  No respiratory distress Abdomen:   Flat, soft, nontender. No appreciable masses or hepatomegaly. No rebound signs or other peritoneal signs. Msk:  MAEW x4, No clubbing or cyanosis. Strength 5/5. Symmetrical without gross deformities. Neurologic:  Alert and  oriented x4;  Cranial nerves II-XII intact.  Skin:  Warm, dry, pink without significant lesions or rashes. Psych:  Alert and cooperative. Normal affect.  LAB RESULTS: Recent Labs    02/17/22 1156 02/18/22 0325  WBC 5.2 5.0  HGB 13.3 11.7*  HCT 40.2 35.0*  PLT 174 158   BMET Recent Labs    02/17/22 1156 02/18/22 0325  NA 138 140  K 3.8 3.6  CL 102 104  CO2 27 25  GLUCOSE 179* 109*  BUN 22 21  CREATININE 1.02* 1.09*  CALCIUM 9.4 9.2   LFT Recent Labs    02/18/22 0325  PROT 6.0*  ALBUMIN 3.4*  AST 43*  ALT 36  ALKPHOS 83  BILITOT 1.6*  BILIDIR 0.3*  IBILI 1.3*   PT/INR No results for input(s): "LABPROT", "INR" in the last 72 hours.  STUDIES: US Venous Img Lower Bilateral (DVT)  Result Date: 02/18/2022 CLINICAL DATA:  [id number] Leg swelling [id number]  Leg pain [id number] EXAM: BILATERAL LOWER EXTREMITY VENOUS DOPPLER ULTRASOUND TECHNIQUE: Gray-scale sonography with compression, as well as color and duplex ultrasound, were performed to evaluate the deep venous system(s) from the level of the common femoral vein through the popliteal and proximal calf veins. COMPARISON:  None Available. FINDINGS: VENOUS Normal compressibility of the common femoral, superficial femoral, and popliteal veins, as well as the visualized calf veins. Visualized portions of profunda femoral vein and great saphenous vein unremarkable. No filling defects to suggest DVT on grayscale or color Doppler imaging. Doppler waveforms show normal direction of venous flow, normal respiratory plasticity and response to augmentation. OTHER None. Limitations: none IMPRESSION: Negative. Electronically Signed   By: Lucrezia Europe M.D.   On: 02/18/2022 10:21   US Abdomen Limited RUQ (LIVER/GB)  Result Date: 02/18/2022 CLINICAL DATA:  Abdominal pain.  Elevated liver enzymes. EXAM: ULTRASOUND ABDOMEN LIMITED RIGHT UPPER QUADRANT COMPARISON:  CT abdomen and pelvis 02/17/2022 FINDINGS:  Gallbladder: Surgically absent. Common bile duct: Diameter: 9 mm proximally. The mid and distal common bile duct was obscured by bowel gas, precluding assessment of the possible filling defect noted on CT. Liver: Mildly increased parenchymal echogenicity diffusely. Slightly irregular liver contour. No focal liver lesion identified. Portal vein is patent on color Doppler imaging with normal direction of blood flow towards the liver. Other: None. IMPRESSION: 1. Prior cholecystectomy. Mid to distal common bile duct obscured by bowel gas. 2. Mildly echogenic liver, nonspecific though can be seen with steatosis, infiltrative processes, and chronic hepatitis (with early cirrhosis not excluded given slightly nodular liver contour). Electronically Signed   By: Logan Bores M.D.   On: 02/18/2022 10:17   ECHOCARDIOGRAM COMPLETE  Result Date:  02/18/2022    ECHOCARDIOGRAM REPORT   Patient Name:   [name] Date of Exam: 02/17/2022 Medical Rec #:  [id number]    Height:       66.5 in Accession #:    [id number]   Weight:       200.0 lb Date of Birth:  May 07, 1930    BSA:          2.011 m Patient Age:    64 years     BP:           157/88 mmHg Patient Gender: F            HR:           99 bpm. Exam Location:  [facility] Procedure: 2D Echo, Cardiac Doppler and Color Doppler Indications:     R07.9 Chest pain  History:         Patient has prior history of Echocardiogram examinations, most                  recent 09/24/2021. CAD, Prior CABG, TIA, Arrythmias:PVC; Risk                  Factors:Hypertension and Dyslipidemia.  Sonographer:     Cresenciano Lick RDCS Referring Phys:  [id number] AMY N COX Diagnosing Phys: Harrell Gave End MD  Sonographer Comments: Technically difficult study due to poor echo windows. IMPRESSIONS  1. Left ventricular ejection fraction, by estimation, is 60 to 65%. The left ventricle has normal function. The left ventricle has no regional wall motion abnormalities. Left ventricular diastolic parameters are consistent with Grade I diastolic dysfunction (impaired relaxation). Elevated left atrial pressure.  2. Right ventricular systolic function is normal. The right ventricular size is normal. There is normal pulmonary artery systolic pressure.  3. The mitral valve is degenerative. Mild to moderate mitral valve regurgitation. Mild mitral stenosis.  4. The aortic valve was not well visualized. There is mild calcification of the aortic valve. There is moderate thickening of the aortic valve. Aortic valve regurgitation is not visualized. Aortic valve sclerosis/calcification is present, without any evidence of aortic stenosis.  5. The inferior vena cava is normal in size with greater than 50% respiratory variability, suggesting right atrial pressure of 3 mmHg. FINDINGS  Left Ventricle: Left ventricular ejection fraction, by estimation, is 60 to 65%.  The left ventricle has normal function. The left ventricle has no regional wall motion abnormalities. The left ventricular internal cavity size was normal in size. There is  no left ventricular hypertrophy. Left ventricular diastolic parameters are consistent with Grade I diastolic dysfunction (impaired relaxation). Elevated left atrial pressure. Right Ventricle: The right ventricular size is normal. No increase in right ventricular wall thickness. Right ventricular systolic function is normal. There  is normal pulmonary artery systolic pressure. The tricuspid regurgitant velocity is 2.12 m/s, and  with an assumed right atrial pressure of 3 mmHg, the estimated right ventricular systolic pressure is 26.9 mmHg. Left Atrium: Left atrial size was normal in size. Right Atrium: Right atrial size was normal in size. Pericardium: There is no evidence of pericardial effusion. Mitral Valve: The mitral valve is degenerative in appearance. There is mild thickening of the mitral valve leaflet(s). There is mild calcification of the mitral valve leaflet(s). Mild to moderate mitral annular calcification. Mild to moderate mitral valve regurgitation. Mild mitral valve stenosis. The mean mitral valve gradient is 5.9 mmHg. Tricuspid Valve: The tricuspid valve is not well visualized. Tricuspid valve regurgitation is mild. Aortic Valve: The aortic valve was not well visualized. There is mild calcification of the aortic valve. There is moderate thickening of the aortic valve. Aortic valve regurgitation is not visualized. Aortic valve sclerosis/calcification is present, without any evidence of aortic stenosis. Pulmonic Valve: The pulmonic valve was not well visualized. Pulmonic valve regurgitation is not visualized. No evidence of pulmonic stenosis. Aorta: The aortic root was not well visualized. Pulmonary Artery: The pulmonary artery is not well seen. Venous: The inferior vena cava is normal in size with greater than 50% respiratory  variability, suggesting right atrial pressure of 3 mmHg. IAS/Shunts: No atrial level shunt detected by color flow Doppler.  LEFT VENTRICLE PLAX 2D LVIDd:         4.00 cm   Diastology LVIDs:         2.50 cm   LV e' medial:    5.55 cm/s LV PW:         1.00 cm   LV E/e' medial:  20.2 LV IVS:        0.70 cm   LV e' lateral:   8.27 cm/s LVOT diam:     1.30 cm   LV E/e' lateral: 13.5 LV SV:         27 LV SV Index:   13 LVOT Area:     1.33 cm  RIGHT VENTRICLE             IVC RV Basal diam:  3.90 cm     IVC diam: 1.40 cm RV S prime:     11.40 cm/s TAPSE (M-mode): 1.8 cm LEFT ATRIUM             Index        RIGHT ATRIUM           Index LA diam:        3.50 cm 1.74 cm/m   RA Area:     12.80 cm LA Vol (A2C):   49.2 ml 24.47 ml/m  RA Volume:   28.30 ml  14.07 ml/m LA Vol (A4C):   42.2 ml 20.98 ml/m LA Biplane Vol: 47.0 ml 23.37 ml/m  AORTIC VALVE LVOT Vmax:   114.33 cm/s LVOT Vmean:  79.200 cm/s LVOT VTI:    0.204 m MITRAL VALVE                TRICUSPID VALVE MV Area (PHT): 3.68 cm     TR Peak grad:   18.0 mmHg MV Area VTI:   0.97 cm     TR Vmax:        212.00 cm/s MV Mean grad:  5.9 mmHg MV VTI:        0.28 m       SHUNTS MV Decel Time: 206 msec  Systemic VTI:  0.20 m MV E velocity: 112.00 cm/s  Systemic Diam: 1.30 cm MV A velocity: 151.00 cm/s MV E/A ratio:  0.74 Nelva Bush MD Electronically signed by Nelva Bush MD Signature Date/Time: 02/18/2022/7:06:15 AM    Final    CT Angio Chest PE W/Cm &/Or Wo Cm  Addendum Date: 02/17/2022   ADDENDUM REPORT: 02/17/2022 16:48 ADDENDUM: Imaging findings were discussed with Dr. Quentin Cornwall of the emergency department rather than Dr. Charna Archer as was outlined in the previous addendum. Nodules in the RIGHT lung base while favored to be infectious or inflammatory follow-up imaging is suggested to ensure resolution in this patient with history of lung cancer. This was also relayed via telephone, by me, at the time discussion. Electronically Signed   By: Zetta Bills M.D.    On: 02/17/2022 16:48   Addendum Date: 02/17/2022   ADDENDUM REPORT: 02/17/2022 16:11 ADDENDUM: These results were called by telephone at the time of interpretation on 02/17/2022 at 4:01 pm to provider [facility] , who verbally acknowledged these results. Electronically Signed   By: Zetta Bills M.D.   On: 02/17/2022 16:11   Result Date: 02/17/2022 CLINICAL DATA:  Abdominal pain.  History of RIGHT lung cancer. * Tracking Code: BO * EXAM: CT ANGIOGRAPHY CHEST WITH CONTRAST TECHNIQUE: Multidetector CT imaging of the chest was performed using the standard protocol during bolus administration of intravenous contrast. Multiplanar CT image reconstructions and MIPs were obtained to evaluate the vascular anatomy. RADIATION DOSE REDUCTION: This exam was performed according to the departmental dose-optimization program which includes automated exposure control, adjustment of the mA and/or kV according to patient size and/or use of iterative reconstruction technique. CONTRAST:  178m OMNIPAQUE IOHEXOL 350 MG/ML SOLN COMPARISON:  November of 2019. FINDINGS: Cardiovascular: Calcified aortic atherosclerotic changes. Signs of median sternotomy. Dense material along the margin of the ascending thoracic aorta near the proximal arch related to dystrophic calcification or pledget material the setting of prior surgery. No signs of aortic dilation. Irregularity of ascending thoracic aorta with subtle suspected focal dissection perhaps related to prior aortic manipulation without substantial change as a subtle abnormality on prior imaging (image 172/4) Heart size mildly enlarged. Three-vessel coronary artery calcification and signs of prior bypass. Study optimized for evaluation of pulmonary vascular bed which is opacified to between 490 and 600 Hounsfield units. Study is negative for pulmonary embolism. Mediastinum/Nodes: Mildly patulous esophagus. No thoracic inlet adenopathy. No axillary lymphadenopathy. No hilar adenopathy.  Lungs/Pleura: Signs of pleural thickening along the posteromedial RIGHT chest following RIGHT lower lobectomy and along the anterolateral RIGHT chest with mild pleural thickening associated with calcification likely related to postoperative and post treatment changes. Trace pleural effusion at the RIGHT lung base is similar to prior imaging. No pneumothorax. No signs of dense consolidative process. Subtle tree in bud in the posterior RIGHT upper lobe (image 66/3) this is stable compared to previous imaging. Tree-in-bud opacities are also noted at the RIGHT lung base which are new compared to previous imaging in the largest area of nodularity is a discrete nodule along the pleural surface measuring approximately 5 x 7 mm (image 111/3) no additional signs of new nodule. There are other small nodules along the pleural surface of the RIGHT lung. Upper Abdomen: Incidental imaging of upper abdominal contents with signs of pneumobilia. No acute upper abdominal findings. See dedicated abdominal report for further details. Abdominal contents Musculoskeletal: Spinal degenerative changes without acute or destructive bone process. Review of the MIP images confirms the above findings. IMPRESSION: 1.  Study negative for pulmonary embolism. 2. This non gated study shows no definitive signs of opacification of the RIGHT coronary graft. Chronicity is uncertain but perhaps there was some contrast in the graft on previous imaging from 2021. Correlate with cardiac enzymes. 3. Signs of pleural thickening along the posteromedial RIGHT chest following RIGHT lower lobectomy and along the anterolateral RIGHT chest with mild pleural thickening associated with calcification likely related to postoperative and post treatment changes. Trace pleural effusion at the RIGHT lung base is similar to prior imaging. 4. Tree-in-bud opacities are noted at the RIGHT lung base which are new compared to previous imaging in the largest area of nodularity is a  discrete nodule along the pleural surface measuring approximately 5 x 7 mm. Findings are concerning for infectious or inflammatory process. 5. Irregularity of ascending thoracic aorta with subtle suspected focal dissection perhaps related to prior aortic manipulation without substantial change as a subtle abnormality on prior imaging. Consider follow-up at 3-6 months to ensure stability but lack of stranding and thickening in this area and presence on prior imaging without change since 2021 6. Three-vessel coronary artery calcification and signs of prior bypass. 7. Aortic atherosclerosis. Aortic Atherosclerosis (ICD10-I70.0). A call is out to the referring provider to further discuss findings in the above case. Electronically Signed: By: Zetta Bills M.D. On: 02/17/2022 15:47   CT Abdomen Pelvis W Contrast  Result Date: 02/17/2022 CLINICAL DATA:  Abdominal pain in a patient with history of lung cancer. Pain is radiating to the chest by report. * Tracking Code: BO * EXAM: CT ABDOMEN AND PELVIS WITH CONTRAST TECHNIQUE: Multidetector CT imaging of the abdomen and pelvis was performed using the standard protocol following bolus administration of intravenous contrast. RADIATION DOSE REDUCTION: This exam was performed according to the departmental dose-optimization program which includes automated exposure control, adjustment of the mA and/or kV according to patient size and/or use of iterative reconstruction technique. CONTRAST:  168m OMNIPAQUE IOHEXOL 350 MG/ML SOLN COMPARISON:  Abdominal imaging from 2019 in prior your CP Z. FINDINGS: Lower chest: Signs of mild cardiac to moderate cardiac enlargement, see dedicated chest CT for further details regarding chest findings. Patchy nodularity at the RIGHT lung base dominant nodule approaching a cm in size. Hepatobiliary: Pneumobilia with mild peripheral peribiliary enhancement near the ampulla. CBD dilation is improved compared to 2019 at 13 mm as compared to 16 mm but  there is a potential filling defect in the common bile duct measuring approximately 16 x 8 mm. No focal, suspicious hepatic lesion. Portal vein is patent. SMV is patent. Hepatic veins are patent. Pancreas: Mild atrophy of the pancreas without ductal dilation, inflammation or visible lesion. Spleen: Calcifications along the margin of the spleen, no focal lesion in the spleen. Spleen is not enlarged. Adrenals/Urinary Tract: Adrenal glands are normal. Symmetric renal enhancement without hydronephrosis or suspicious renal lesion. No thickening of the urinary bladder. No nephrolithiasis or ureteral calculi on this contrasted exam. Stomach/Bowel: Small hiatal hernia. No stranding adjacent to the stomach. No small bowel obstruction or inflammation. Appendix is not visible though there are no secondary signs that would suggest acute appendicitis. No signs of colonic inflammation. Vascular/Lymphatic: Aortic atherosclerosis. No sign of aneurysm. Smooth contour of the IVC. There is no gastrohepatic or hepatoduodenal ligament lymphadenopathy. No retroperitoneal or mesenteric lymphadenopathy. No pelvic sidewall lymphadenopathy. Reproductive: Post hysterectomy.  No ascites. Other: No ascites. Small nodule in the RIGHT mesorectum is not substantially changed compared to imaging from 2019 (image 77/4) this measures approximately 8  mm and is been present to some extent since 2013 perhaps related to prior inflammation. Musculoskeletal: No acute bone finding. No destructive bone process. Spinal degenerative changes. IMPRESSION: 1. Pneumobilia with mild peripheral peribiliary enhancement near the ampulla. Correlate with any clinical or laboratory evidence of cholangitis. There is also a potential filling defect which may represent tumefactive sludge or a biliary calculus in the mid common bile duct. MRCP could be considered as warranted for further evaluation. Biliary duct dilation is improved compared to more remote imaging from 2019.  2. Patchy nodularity at the RIGHT lung base dominant nodule approaching a cm in size. Correlate with any history of recent infection or aspiration. Given discrete nodule at the RIGHT lung base would suggest follow-up in 8-12 weeks to exclude persistence in this patient with lung cancer. 3. Small hiatal hernia. 4. Aortic atherosclerosis. Aortic Atherosclerosis (ICD10-I70.0). Electronically Signed   By: Zetta Bills M.D.   On: 02/17/2022 16:00   DG Chest 2 View  Result Date: 02/17/2022 CLINICAL DATA:  cp EXAM: CHEST - 2 VIEW COMPARISON:  09/24/2021. FINDINGS: Cardiac silhouette is unremarkable. No pneumothorax or pleural effusion. The lungs are clear. Aorta ectatic and calcified. The visualized skeletal structures are unremarkable. Patient is status post median sternotomy and CABG. IMPRESSION: No acute cardiopulmonary process. Electronically Signed   By: Sammie Bench M.D.   On: 02/17/2022 12:38       Impression / Plan:   89 y/o lady with history of lung cancer, hypertension, and history of choledocholithiasis here with sudden onset of epigastric pain with CT findings concerning for choledocholithiasis  - clear liquids - antibiotics given dilated duct and stones in the CBD, no signs or symptoms of cholangitis - NPO at midnight - will discuss with Dr. Allen Norris about potential ERCP tomorrow - hold any heparin products  Raylene Miyamoto MD, MPH [place]

## 2022-02-19 ENCOUNTER — Inpatient Hospital Stay: Payer: Medicare Other | Admitting: Anesthesiology

## 2022-02-19 ENCOUNTER — Encounter: Payer: Self-pay | Admitting: Student

## 2022-02-19 ENCOUNTER — Encounter
Admission: EM | Disposition: A | Discharge status: Home Health | Payer: Self-pay | Source: Home / Self Care | Attending: Student

## 2022-02-19 ENCOUNTER — Inpatient Hospital Stay: Payer: Medicare Other

## 2022-02-19 DIAGNOSIS — K8050 Calculus of bile duct without cholangitis or cholecystitis without obstruction: Secondary | ICD-10-CM

## 2022-02-19 DIAGNOSIS — K311 Adult hypertrophic pyloric stenosis: Secondary | ICD-10-CM

## 2022-02-19 DIAGNOSIS — R1013 Epigastric pain: Secondary | ICD-10-CM | POA: Diagnosis not present

## 2022-02-19 DIAGNOSIS — F419 Anxiety disorder, unspecified: Secondary | ICD-10-CM

## 2022-02-19 DIAGNOSIS — I10 Essential (primary) hypertension: Secondary | ICD-10-CM | POA: Diagnosis not present

## 2022-02-19 DIAGNOSIS — I340 Nonrheumatic mitral (valve) insufficiency: Secondary | ICD-10-CM

## 2022-02-19 DIAGNOSIS — R5381 Other malaise: Secondary | ICD-10-CM | POA: Diagnosis not present

## 2022-02-19 DIAGNOSIS — R079 Chest pain, unspecified: Secondary | ICD-10-CM | POA: Diagnosis not present

## 2022-02-19 HISTORY — PX: ERCP: SHX5425

## 2022-02-19 LAB — COMPREHENSIVE METABOLIC PANEL WITH GFR
ALT: 33 U/L (ref 0–44)
AST: 36 U/L (ref 15–41)
Albumin: 3.2 g/dL — ABNORMAL LOW (ref 3.5–5.0)
Alkaline Phosphatase: 77 U/L (ref 38–126)
Anion gap: 7 (ref 5–15)
BUN: 13 mg/dL (ref 8–23)
CO2: 25 mmol/L (ref 22–32)
Calcium: 8.4 mg/dL — ABNORMAL LOW (ref 8.9–10.3)
Chloride: 107 mmol/L (ref 98–111)
Creatinine, Ser: 0.96 mg/dL (ref 0.44–1.00)
GFR, Estimated: 57 mL/min — ABNORMAL LOW
Glucose, Bld: 103 mg/dL — ABNORMAL HIGH (ref 70–99)
Potassium: 3.5 mmol/L (ref 3.5–5.1)
Sodium: 139 mmol/L (ref 135–145)
Total Bilirubin: 1.5 mg/dL — ABNORMAL HIGH (ref 0.3–1.2)
Total Protein: 5.7 g/dL — ABNORMAL LOW (ref 6.5–8.1)

## 2022-02-19 LAB — HEPATITIS PANEL, ACUTE
HCV Ab: NONREACTIVE
Hep A IgM: NONREACTIVE
Hep B C IgM: NONREACTIVE
Hepatitis B Surface Ag: NONREACTIVE

## 2022-02-19 LAB — CBC WITH DIFFERENTIAL/PLATELET
Abs Immature Granulocytes: 0.01 10*3/uL (ref 0.00–0.07)
Basophils Absolute: 0 10*3/uL (ref 0.0–0.1)
Basophils Relative: 0 %
Eosinophils Absolute: 0.1 10*3/uL (ref 0.0–0.5)
Eosinophils Relative: 2 %
HCT: 34.2 % — ABNORMAL LOW (ref 36.0–46.0)
Hemoglobin: 11.4 g/dL — ABNORMAL LOW (ref 12.0–15.0)
Immature Granulocytes: 0 %
Lymphocytes Relative: 27 %
Lymphs Abs: 1.4 10*3/uL (ref 0.7–4.0)
MCH: 30.7 pg (ref 26.0–34.0)
MCHC: 33.3 g/dL (ref 30.0–36.0)
MCV: 92.2 fL (ref 80.0–100.0)
Monocytes Absolute: 0.5 10*3/uL (ref 0.1–1.0)
Monocytes Relative: 10 %
Neutro Abs: 2.9 10*3/uL (ref 1.7–7.7)
Neutrophils Relative %: 61 %
Platelets: 153 10*3/uL (ref 150–400)
RBC: 3.71 MIL/uL — ABNORMAL LOW (ref 3.87–5.11)
RDW: 12.5 % (ref 11.5–15.5)
WBC: 4.9 10*3/uL (ref 4.0–10.5)
nRBC: 0 % (ref 0.0–0.2)

## 2022-02-19 LAB — LIPASE, BLOOD: Lipase: 35 U/L (ref 11–51)

## 2022-02-19 SURGERY — ERCP, WITH INTERVENTION IF INDICATED
Anesthesia: General

## 2022-02-19 MED ORDER — PHENYLEPHRINE 80 MCG/ML (10ML) SYRINGE FOR IV PUSH (FOR BLOOD PRESSURE SUPPORT)
PREFILLED_SYRINGE | INTRAVENOUS | Status: DC | PRN
  Administered 2022-02-19 (×2): 80 ug via INTRAVENOUS

## 2022-02-19 MED ORDER — MIDAZOLAM HCL 2 MG/2ML IJ SOLN
INTRAMUSCULAR | Status: AC
  Filled 2022-02-19: qty 2

## 2022-02-19 MED ORDER — LACTATED RINGERS IV SOLN: INTRAVENOUS | Status: DC | PRN

## 2022-02-19 MED ORDER — LABETALOL HCL 5 MG/ML IV SOLN
10.0000 mg | INTRAVENOUS | Status: DC | PRN
  Filled 2022-02-19: qty 4

## 2022-02-19 MED ORDER — PROPOFOL 10 MG/ML IV BOLUS
INTRAVENOUS | Status: DC | PRN
  Administered 2022-02-19: 100 mg via INTRAVENOUS

## 2022-02-19 MED ORDER — FENTANYL CITRATE (PF) 100 MCG/2ML IJ SOLN
INTRAMUSCULAR | Status: DC | PRN
  Administered 2022-02-19: 50 ug via INTRAVENOUS

## 2022-02-19 MED ORDER — CARVEDILOL 12.5 MG PO TABS
12.5000 mg | ORAL_TABLET | Freq: Two times a day (BID) | ORAL | Status: DC
  Filled 2022-02-19 (×2): qty 1

## 2022-02-19 MED ORDER — PROPOFOL 10 MG/ML IV BOLUS
INTRAVENOUS | Status: AC
  Filled 2022-02-19: qty 20

## 2022-02-19 MED ORDER — INDOMETHACIN 50 MG RE SUPP
100.0000 mg | Freq: Once | RECTAL | Status: AC
  Administered 2022-02-19: 100 mg via RECTAL

## 2022-02-19 MED ORDER — POTASSIUM CHLORIDE CRYS ER 20 MEQ PO TBCR
40.0000 meq | EXTENDED_RELEASE_TABLET | Freq: Once | ORAL | Status: AC
  Administered 2022-02-19: 40 meq via ORAL
  Filled 2022-02-19: qty 2

## 2022-02-19 MED ORDER — LIDOCAINE HCL (CARDIAC) PF 100 MG/5ML IV SOSY
PREFILLED_SYRINGE | INTRAVENOUS | Status: DC | PRN
  Administered 2022-02-19: 100 mg via INTRAVENOUS

## 2022-02-19 MED ORDER — SUCCINYLCHOLINE CHLORIDE 200 MG/10ML IV SOSY
PREFILLED_SYRINGE | INTRAVENOUS | Status: DC | PRN
  Administered 2022-02-19: 120 mg via INTRAVENOUS

## 2022-02-19 MED ORDER — METRONIDAZOLE 500 MG/100ML IV SOLN
500.0000 mg | Freq: Three times a day (TID) | INTRAVENOUS | Status: DC
  Administered 2022-02-19: 500 mg via INTRAVENOUS
  Filled 2022-02-19 (×2): qty 100

## 2022-02-19 MED ORDER — FENTANYL CITRATE (PF) 100 MCG/2ML IJ SOLN
INTRAMUSCULAR | Status: AC
  Filled 2022-02-19: qty 2

## 2022-02-19 MED ORDER — INDOMETHACIN 50 MG RE SUPP
RECTAL | Status: AC
  Filled 2022-02-19: qty 2

## 2022-02-19 MED ORDER — MORPHINE SULFATE (PF) 2 MG/ML IV SOLN
2.0000 mg | Freq: Once | INTRAVENOUS | Status: AC
  Administered 2022-02-19: 2 mg via INTRAVENOUS
  Filled 2022-02-19: qty 1

## 2022-02-19 NOTE — Progress Notes (Signed)
Mobility Specialist - Progress Note   02/19/22 1108  Mobility  Activity Ambulated with assistance to bathroom;Stood at bedside;Dangled on edge of bed  Level of Assistance Standby assist, set-up cues, supervision of patient - no hands on  Assistive Device None  Distance Ambulated (ft) 10 ft  Activity Response Tolerated well  Mobility Referral Yes  $Mobility charge 1 Mobility   Pt semi-supine in bed on RA upon arrival. Pt completes bed mobility, STS, and ambulates to/from bathroom SBA with extra time but no LOB noted. Pt returns to bed with needs in reach and bed alarm set.   Gretchen Short  Mobility Specialist  02/19/22 11:11 AM

## 2022-02-19 NOTE — Op Note (Signed)
[facility] Gastroenterology Patient Name: [name] Procedure Date: 02/19/2022 2:11 PM MRN: [id number] Account #: [id number] Date of Birth: 1930/02/15 Admit Type: Inpatient Age: 89 Room: [facility] ENDO ROOM 4 Gender: Female Note Status: Finalized Instrument Name: [id number] Procedure:             ERCP Indications:           Common bile duct stone(s) Providers:             Lucilla Lame MD, MD Referring MD:          No Local Md, MD (Referring MD) Medicines:             General Anesthesia Complications:         No immediate complications. Procedure:             Pre-Anesthesia Assessment:                        - Prior to the procedure, a History and Physical was                         performed, and patient medications and allergies were                         reviewed. The patient's tolerance of previous                         anesthesia was also reviewed. The risks and benefits                         of the procedure and the sedation options and risks                         were discussed with the patient. All questions were                         answered, and informed consent was obtained. Prior                         Anticoagulants: The patient has taken no anticoagulant                         or antiplatelet agents. ASA Grade Assessment: II - A                         patient with mild systemic disease. After reviewing                         the risks and benefits, the patient was deemed in                         satisfactory condition to undergo the procedure.                        After obtaining informed consent, the scope was passed                         under direct vision. Throughout the procedure, the  patient's blood pressure, pulse, and oxygen                         saturations were monitored continuously. The                         Duodenoscope was introduced through the mouth, and                         used  to inject contrast into and used to inject                         contrast into the bile duct. The ERCP was accomplished                         without difficulty. The patient tolerated the                         procedure well. Findings:      A scout film of the abdomen was obtained. Surgical clips, consistent       with a previous cholecystectomy, were seen in the area of the right       upper quadrant of the abdomen. The esophagus was successfully intubated       under direct vision. The scope was advanced to a normal major papilla in       the descending duodenum without detailed examination of the pharynx,       larynx and associated structures, and upper GI tract. The upper GI tract       was grossly normal. A standard esophagogastroduodenoscopy scope was used       for the examination of the upper gastrointestinal tract. The scope was       passed under direct vision through the upper GI tract. A       benign-appearing, intrinsic moderate stenosis was found at the pylorus.       This was non-traversed. A TTS dilator was passed through the scope.       Dilation with a 12-13.5-15 mm pyloric balloon dilator was performed at       the pylorus. A biliary sphincterotomy had been performed. The       sphincterotomy appeared open. The bile duct was deeply cannulated with       the short-nosed traction sphincterotome. Contrast was injected. I       personally interpreted the bile duct images. There was brisk flow of       contrast through the ducts. Image quality was excellent. Contrast       extended to the hepatic ducts. A wire was passed into the biliary tree.       A 4 mm biliary sphincterotomy was made with a traction (standard)       sphincterotome using ERBE electrocautery. There was no       post-sphincterotomy bleeding. The biliary tree was swept with a 15 mm       balloon starting at the bifurcation. Sludge was swept from the duct. All       stones were removed. Nothing was  found. Impression:            - Gastric stenosis was found at the pylorus.                        -  Dilation performed at the pylorus.                        - Prior biliary sphincterotomy appeared open.                        - Choledocholithiasis was found. Complete removal was                         accomplished by biliary sphincterotomy and balloon                         extraction.                        - A biliary sphincterotomy was performed.                        - The biliary tree was swept and nothing was found. Recommendation:        - Return patient to hospital ward for ongoing care.                        - Resume previous diet.                        - Watch for pancreatitis, bleeding, perforation, and                         cholangitis. Procedure Code(s):     --- Professional ---                        [phone], Endoscopic retrograde cholangiopancreatography                         (ERCP); with removal of calculi/debris from                         biliary/pancreatic duct(s)                        43262, Endoscopic retrograde cholangiopancreatography                         (ERCP); with sphincterotomy/papillotomy                        43245, Esophagogastroduodenoscopy, flexible,                         transoral; with dilation of gastric/duodenal                         stricture(s) (eg, balloon, bougie)                        62263, Endoscopic catheterization of the biliary                         ductal system, radiological supervision and                         interpretation Diagnosis Code(s):     --- Professional ---  K80.50, Calculus of bile duct without cholangitis or                         cholecystitis without obstruction                        K31.1, Adult hypertrophic pyloric stenosis CPT copyright 2022 American Medical Association. All rights reserved. The codes documented in this report are preliminary and upon coder review may  be  revised to meet current compliance requirements. Lucilla Lame MD, MD 02/19/2022 3:19:49 PM This report has been signed electronically. Number of Addenda: 0 Note Initiated On: 02/19/2022 2:11 PM Estimated Blood Loss:  Estimated blood loss: none.      [facility]

## 2022-02-19 NOTE — Anesthesia Preprocedure Evaluation (Addendum)
Anesthesia Evaluation  Patient identified by MRN, date of birth, ID band Patient awake    Reviewed: Allergy & Precautions, NPO status , Patient's Chart, lab work & pertinent test results  History of Anesthesia Complications (+) PONV and history of anesthetic complications  Airway Mallampati: III  TM Distance: >3 FB Neck ROM: full    Dental  (+) Chipped   Pulmonary neg pulmonary ROS, pneumonia   Pulmonary exam normal        Cardiovascular hypertension, + angina  + CAD, + Past MI and +CHF  Normal cardiovascular exam  02/17/22 ECHO IMPRESSIONS     1. Left ventricular ejection fraction, by estimation, is 60 to 65%. The  left ventricle has normal function. The left ventricle has no regional  wall motion abnormalities. Left ventricular diastolic parameters are  consistent with Grade I diastolic  dysfunction (impaired relaxation). Elevated left atrial pressure.   2. Right ventricular systolic function is normal. The right ventricular  size is normal. There is normal pulmonary artery systolic pressure.   3. The mitral valve is degenerative. Mild to moderate mitral valve  regurgitation. Mild mitral stenosis.   4. The aortic valve was not well visualized. There is mild calcification  of the aortic valve. There is moderate thickening of the aortic valve.  Aortic valve regurgitation is not visualized. Aortic valve  sclerosis/calcification is present, without any  evidence of aortic stenosis.   5. The inferior vena cava is normal in size with greater than 50%  respiratory variability, suggesting right atrial pressure of 3 mmHg.     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 1986: s/p PTCA of bifurcation lesion in LAD;  b.               2001: s/p BMS to LAD & LCX;  c. 04/2006 Cath: diff CAD w/o              critical narrowing, nl EF;  d. 12/2014 MV: inflat ST dep,              mild basal inf ischemia, EF 55-65%-->Low risk; e. 12/2016              - 3V CABG. yrs ago her pt: Cancer [facility])     Comment:  right lobe of lung. surgery only.  No date: Carotid disease, bilateral ([place])     Comment:  a. 08/2015 Carotid U/S: < 39% bilat ICA stenosis. No date: Cataracts, bilateral No date: Cholangitis No date: Choledocholithiasis No date: Cholelithiases No date: Chronic diarrhea No date: Depression No date: Frequent PVCs No date: Gilbert's syndrome No date: Headache     Comment:  PMH: migraines No date: History of echocardiogram     Comment:  a. 06/2010 Echo: EF 60-65%, mild AI/MR; b. 10/2015 Echo:               EF 60-65%, no rwma, mild MR, nl RV fxn. No date: HTN (hypertension) No date: Hyperlipidemia No date: Impaired fasting blood sugar  No date: Keloid     Comment:  mid sternum No date: Macular degeneration No date: Osteoarthritis No date: Pneumonia No date: PONV (postoperative nausea and vomiting) No date: Positional vertigo     Comment:  benign 12/2016: Postoperative atrial fibrillation ([place]) 12/23/2016: S/P CABG x 3     Comment:  LIMA to LAD, SVG to OM, SVG to PDA, EVH via left thigh No date: TIA (transient ischemic attack) No date: Wears glasses No date: Wears hearing aid  Past Surgical History: 12/22/2016: ABDOMINAL AORTOGRAM; N/A     Comment:  Procedure: ABDOMINAL AORTOGRAM;  Surgeon: Nelva Bush, MD;  Location: [place] [facility];                Service: Cardiovascular;  Laterality: N/A; No date:  ABDOMINAL HYSTERECTOMY 12/10/2017: BILIARY STENT PLACEMENT     Comment:  Procedure: BILIARY STENT PLACEMENT;  Surgeon:               Irving Copas., MD;  Location: [place];                Service: Gastroenterology;; No date: CHOLECYSTECTOMY 12/23/2016: CORONARY ARTERY BYPASS GRAFT; N/A     Comment:  Procedure: CORONARY ARTERY BYPASS GRAFTING (CABG) TIMES               THREE USING LEFT INTERNAL MAMMARY ARTERY AND LEFT               SAPHENOUS LEG VEIN HARVESTED ENDOSCOPICALLY,;  Surgeon:               Rexene Alberts, MD;  Location: [place];  Service: Open               Heart Surgery;  Laterality: N/A; 02/21/2018: ENDOSCOPIC RETROGRADE CHOLANGIOPANCREATOGRAPHY (ERCP) WITH  PROPOFOL; N/A     Comment:  Procedure: ENDOSCOPIC RETROGRADE               CHOLANGIOPANCREATOGRAPHY (ERCP) WITH PROPOFOL;  Surgeon:               Rush Landmark Telford Nab., MD;  Location: [place];                Service: Gastroenterology;  Laterality: N/A; 12/10/2017: ERCP; N/A     Comment:  Procedure: ENDOSCOPIC RETROGRADE               CHOLANGIOPANCREATOGRAPHY (ERCP);  Surgeon: Irving Copas., MD;  Location: [place];  Service:               Gastroenterology;  Laterality: N/A; 12/22/2016: IABP INSERTION; N/A     Comment:  Procedure: IABP Insertion;  Surgeon: Nelva Bush,               MD;  Location: [place] [facility];  Service:               Cardiovascular;  Laterality: N/A; 12/22/2016: LEFT HEART CATH AND CORONARY ANGIOGRAPHY; N/A     Comment:  Procedure: LEFT HEART CATH AND CORONARY ANGIOGRAPHY;                Surgeon: Nelva Bush, MD;  Location: [place]               CV LAB;  Service: Cardiovascular;  Laterality: N/A; 04/27/2012: LEFT HEART CATHETERIZATION WITH CORONARY ANGIOGRAM; N/A  Comment:  Procedure: LEFT HEART CATHETERIZATION WITH CORONARY               ANGIOGRAM;  Surgeon: Peter M Martinique, MD;  Location: [facility]               CATH LAB;  Service:  Cardiovascular;  Laterality: N/A; No date: LOBECTOMY; Right     Comment:  RLL atypical carcinoid tumor - Dr Arlyce Dice 1986: pci 2001: PCI of LAD     Comment:  with non des lad  No date: pci of mid cfx     Comment:  with non des and cutting balloon pci of av circumflex 02/21/2018: REMOVAL OF STONES     Comment:  Procedure: REMOVAL OF STONES;  Surgeon: Irving Copas., MD;  Location: [place];  Service:               Gastroenterology;; 02/21/2018: Joan Mayans     Comment:  Procedure: SPHINCTEROTOMY;  Surgeon: Irving Copas., MD;  Location: [place];  Service:               Gastroenterology;; 02/21/2018: Lavell Islam REMOVAL     Comment:  Procedure: STENT REMOVAL;  Surgeon: Irving Copas., MD;  Location: [place];  Service:               Gastroenterology;; 12/23/2016: TEE WITHOUT CARDIOVERSION; N/A     Comment:  Procedure: TRANSESOPHAGEAL ECHOCARDIOGRAM (TEE);                Surgeon: Rexene Alberts, MD;  Location: [place];                Service: Open Heart Surgery;  Laterality: N/A;  BMI    Body Mass Index: 31.80 kg/m      Reproductive/Obstetrics negative OB ROS                             Anesthesia Physical Anesthesia Plan  ASA: 3  Anesthesia Plan: General   Post-op Pain Management:    Induction: Intravenous  PONV Risk Score and Plan: Propofol infusion and TIVA  Airway Management Planned: Natural Airway, Nasal Cannula and Oral ETT  Additional Equipment:   Intra-op Plan:   Post-operative Plan:   Informed Consent: I have reviewed the patients History and Physical, chart, labs and discussed the procedure including the risks, benefits and alternatives for the proposed anesthesia with the patient or authorized representative who has indicated his/her understanding and acceptance.     Dental Advisory Given  Plan Discussed with: Anesthesiologist, CRNA and  Surgeon  Anesthesia Plan Comments: (Patient consented for risks of anesthesia including but not limited to:  - adverse reactions to medications - risk of airway placement if required - damage to eyes, teeth, lips or other oral mucosa - nerve damage due to positioning  - sore throat or hoarseness - Damage to heart, brain, nerves, lungs, other parts of body or loss of life  Possible ETT required, will discuss w Dr. Allen Norris. Patient voiced understanding.)       Anesthesia Quick Evaluation

## 2022-02-19 NOTE — Transfer of Care (Signed)
Immediate Anesthesia Transfer of Care Note  Patient: [name]  Procedure(s) Performed: Procedure(s): ENDOSCOPIC RETROGRADE CHOLANGIOPANCREATOGRAPHY (ERCP) (N/A)  Patient Location: PACU and Endoscopy Unit  Anesthesia Type:General  Level of Consciousness: sedated  Airway & Oxygen Therapy: Patient Spontanous Breathing and Patient connected to nasal cannula oxygen  Post-op Assessment: Report given to RN and Post -op Vital signs reviewed and stable  Post vital signs: Reviewed and stable  Last Vitals:  Vitals:   02/19/22 1347 02/19/22 1517  BP: (!) 189/87 (!) 155/65  Pulse: 97 77  Resp: 18 13  Temp: 36.5 C (!) 36.3 C  SpO2: 46% 190%    Complications: No apparent anesthesia complications

## 2022-02-19 NOTE — Progress Notes (Signed)
PROGRESS NOTE  [name] HYI:[id number] DOB: 01-Feb-1930   PCP: Greenwood, Acequia @ [facility]  Patient is from: Home.  Ambulates holding to something.  Lives with family.  DOA: 02/17/2022 LOS: 1  Chief complaints Chief Complaint  Patient presents with   Chest Pain     Brief Narrative / Interim history: 89 year old F with PMH of CAD s/p CABG and PCI to LAD and mid LCx, lung cancer status post right lower lobe lobectomy, renal with cholecystectomy, cholangitis with CBD stone s/p ERCP in 7867, diastolic CHF, HTN, PVCs and anxiety presenting with acute abdominal pain across upper abdomen with associated shortness of breath and edema, and admitted for epigastric abdominal pain and abnormal EKG.  In ED, hemodynamically stable.  Afebrile.  Mildly elevated LFT.  No leukocytosis.  Serial troponin negative. EKG with ST/T changes in aVR and anterior leads.  CT angio chest negative for PE, tree-in-bud opacities in right lung base, 5 x 7 mm right lung nodule along the pleural surface and irregularity of ascending thoracic aorta.  CT abdomen and pelvis with pneumobilia, patchy nodularity at right lung base and right lung base nodule for which follow-up imaging in 8 to 12 weeks was recommended.  RUQ ultrasound and right lower extremity venous Doppler without acute finding to explain patient's symptoms.  Cardiology and GI consulted.   Patient's symptoms and LFT improved.  There is a question that she might have passed biliary stone.  Cardiology and GI following   Subjective: Seen and examined earlier this morning.  No major events overnight of this morning.  Reports some back pain from lying in bed.  She was given IV morphine earlier.  Denies nausea, vomiting, abdominal pain.  Denies chest pain or shortness of breath.  Objective: Vitals:   02/18/22 2320 02/19/22 0426 02/19/22 0734 02/19/22 1059  BP: (!) 153/78 (!) 172/66 (!) 169/79 (!) 165/78  Pulse: 85 84 77 82  Resp: '18 18 15 16  '$ Temp:  98.3 F (36.8 C) 98 F (36.7 C) 98 F (36.7 C) 98 F (36.7 C)  TempSrc:      SpO2: 96% 95% 97% 97%  Weight:      Height:        Examination: GENERAL: No apparent distress.  Nontoxic. HEENT: MMM.  Vision and hearing grossly intact.  NECK: Supple.  No apparent JVD.  RESP:  No IWOB.  Fair aeration bilaterally. CVS:  RRR.  2/6 SEM over RUSB. ABD/GI/GU: BS+. Abd soft, NTND.  MSK/EXT:   No apparent deformity. Moves extremities. No edema.  SKIN: no apparent skin lesion or wound NEURO: Awake and alert. Oriented appropriately.  No apparent focal neuro deficit. PSYCH: Calm. Normal affect.   Procedures:  None  Microbiology summarized: None  Assessment and plan: Principal Problem:   Epigastric abdominal pain Active Problems:   Essential hypertension   Anxiety   Obesity (BMI 30-39.9)   Coronary atherosclerosis   Mitral regurgitation   Coronary artery disease involving native coronary artery of native heart without angina pectoris   History of ERCP   History of biliary stent insertion   Physical deconditioning   Elevated liver enzymes   Hyperlipidemia   History of cholangitis   Nodule of lower lobe of right lung   Abnormal CT of the chest  Epigastric abdominal pain: Had acute pain across upper abdomen that has resolved.  LFT was slightly elevated.  CT chest, abdomen and pelvis, RUQ ultrasound and right lower extremity venous doppler without acute finding to explain  patient's symptoms.  She has no fever or leukocytosis to suggest acute infection.  Some abnormalities with EKG but felt to be chronic.  TTE without significant finding.  She appears euvolemic on exam.  She has history of remote cholecystectomy followed by CBD stone with cholangitis in 2019 for which she had ERCP.  She might have passed another CBD stone.  Symptoms and LFT resolved.  She is tolerating clear liquid diet. -GI following-n.p.o. for possible ERCP -On ceftriaxone and Flagyl per GI. -Continue PPI -Pain  control  Atypical chest pain/history of CAD/CABG and multiple stents: Chest pain seems to have resolved.  Serial troponin negative.  Some abnormalities of EKG likely chronic.  Echocardiogram reassuring. -Cardiology following -Increased Coreg to home dose for BP control.   Essential hypertension: BP elevated. -Increased Coreg as above  Elevated liver enzymes/hyperbilirubinemia:  RUQ [place] with mildly echogenic liver.  Acute hepatitis panel negative.  Resolving. -Monitor  Anxiety: Stable -Continue home Ativan.  Abnormal CT chest/abdomen/pelvis: Showed new tree-in-bud opacities at right lung base. -On ceftriaxone and Flagyl as above  Irregularity of ascending thoracic order -Follow-up imaging in 3 to 6 months recommended.  Pulmonary nodule: -Follow-up imaging outpatient.  Generalized weakness/ambulatory dysfunction: Reports ambulating holding to something at home. -PT/OT eval-recommended home health and DME  Hypokalemia -Monitor replenish as appropriate   Obesity: Body mass index is 31.8 kg/m.           DVT prophylaxis:  Place TED hose Start: 02/17/22 1628  Code Status: DNR/DNI Family Communication: None at bedside Level of care: Med-Surg Status is: Inpatient The patient will remain inpatient because: Evaluation for abdominal pain   Final disposition: Home with home health Consultants:  Cardiology Gastroenterology  55 minutes with more than 50% spent in reviewing records, counseling patient/family and coordinating care.   Sch Meds:  Scheduled Meds:  amLODipine  5 mg Oral Daily   carvedilol  12.5 mg Oral BID WC   famotidine  20 mg Oral Daily   multivitamin with minerals  1 tablet Oral Daily   pantoprazole (PROTONIX) IV  40 mg Intravenous Q24H   Continuous Infusions:  cefTRIAXone (ROCEPHIN)  IV Stopped (02/18/22 1550)   metronidazole     PRN Meds:.acetaminophen **OR** acetaminophen, calcium carbonate, labetalol, LORazepam, morphine injection,  nitroGLYCERIN, ondansetron (ZOFRAN) IV  Antimicrobials: Anti-infectives (From admission, onward)    Start     Dose/Rate Route Frequency Ordered Stop   02/19/22 1600  metroNIDAZOLE (FLAGYL) IVPB 500 mg        500 mg 100 mL/hr over 60 Minutes Intravenous Every 8 hours 02/18/22 1453     02/18/22 1600  piperacillin-tazobactam (ZOSYN) IVPB 3.375 g  Status:  Discontinued        3.375 g 12.5 mL/hr over 240 Minutes Intravenous Every 8 hours 02/18/22 1442 02/18/22 1449   02/18/22 1500  cefTRIAXone (ROCEPHIN) 2 g in sodium chloride 0.9 % 100 mL IVPB        2 g 200 mL/hr over 30 Minutes Intravenous Every 24 hours 02/18/22 1452          I have personally reviewed the following labs and images: CBC: Recent Labs  Lab 02/17/22 1156 02/18/22 0325 02/19/22 0145  WBC 5.2 5.0 4.9  NEUTROABS  --   --  2.9  HGB 13.3 11.7* 11.4*  HCT 40.2 35.0* 34.2*  MCV 92.2 90.7 92.2  PLT 174 158 153   BMP &GFR Recent Labs  Lab 02/17/22 1156 02/18/22 0325 02/19/22 0145  NA 138 140 139  K  3.8 3.6 3.5  CL 102 104 107  CO2 '27 25 25  '$ GLUCOSE 179* 109* 103*  BUN '22 21 13  '$ CREATININE 1.02* 1.09* 0.96  CALCIUM 9.4 9.2 8.4*   Estimated Creatinine Clearance: 45.5 mL/min (by C-G formula based on SCr of 0.96 mg/dL). Liver & Pancreas: Recent Labs  Lab 02/17/22 1425 02/18/22 0325 02/19/22 0145  AST 85* 43* 36  ALT 53* 36 33  ALKPHOS 111 83 77  BILITOT 1.8* 1.6* 1.5*  PROT 7.6 6.0* 5.7*  ALBUMIN 4.2 3.4* 3.2*   Recent Labs  Lab 02/17/22 1425 02/19/22 0145  LIPASE 42 35   No results for input(s): "AMMONIA" in the last 168 hours. Diabetic: No results for input(s): "HGBA1C" in the last 72 hours. No results for input(s): "GLUCAP" in the last 168 hours. Cardiac Enzymes: No results for input(s): "CKTOTAL", "CKMB", "CKMBINDEX", "TROPONINI" in the last 168 hours. No results for input(s): "PROBNP" in the last 8760 hours. Coagulation Profile: No results for input(s): "INR", "PROTIME" in the last 168  hours. Thyroid Function Tests: No results for input(s): "TSH", "T4TOTAL", "FREET4", "T3FREE", "THYROIDAB" in the last 72 hours. Lipid Profile: No results for input(s): "CHOL", "HDL", "LDLCALC", "TRIG", "CHOLHDL", "LDLDIRECT" in the last 72 hours. Anemia Panel: No results for input(s): "VITAMINB12", "FOLATE", "FERRITIN", "TIBC", "IRON", "RETICCTPCT" in the last 72 hours. Urine analysis:    Component Value Date/Time   COLORURINE STRAW (A) 10/31/2018 1029   APPEARANCEUR CLEAR (A) 10/31/2018 1029   APPEARANCEUR Clear 11/03/2013 1527   LABSPEC 1.003 (L) 10/31/2018 1029   LABSPEC 1.006 11/03/2013 1527   PHURINE 7.0 10/31/2018 1029   GLUCOSEU NEGATIVE 10/31/2018 1029   GLUCOSEU Negative 11/03/2013 1527   GLUCOSEU NEGATIVE 03/30/2011 1306   HGBUR NEGATIVE 10/31/2018 [place] 10/31/2018 1029   BILIRUBINUR Negative 11/03/2013 1527   KETONESUR NEGATIVE 10/31/2018 1029   PROTEINUR NEGATIVE 10/31/2018 1029   UROBILINOGEN 0.2 03/30/2011 1306   NITRITE NEGATIVE 10/31/2018 1029   LEUKOCYTESUR NEGATIVE 10/31/2018 1029   LEUKOCYTESUR Negative 11/03/2013 1527   Sepsis Labs: Invalid input(s): "PROCALCITONIN", "LACTICIDVEN"  Microbiology: No results found for this or any previous visit (from the past 240 hour(s)).  Radiology Studies: No results found.    Etheridge Geil T. [place]  If 7PM-7AM, please contact night-coverage [email or web address] 02/19/2022, 12:14 PM

## 2022-02-19 NOTE — Progress Notes (Signed)
Rounding Note    Patient Name: [name] Date of Encounter: 02/19/2022  [place] Cardiologist: Ida Rogue, MD   Subjective   Plan for ERCP today. The patient denies chest pain or SOB.   Inpatient Medications    Scheduled Meds:  amLODipine  5 mg Oral Daily   carvedilol  12.5 mg Oral BID WC   famotidine  20 mg Oral Daily   multivitamin with minerals  1 tablet Oral Daily   pantoprazole (PROTONIX) IV  40 mg Intravenous Q24H   Continuous Infusions:  cefTRIAXone (ROCEPHIN)  IV Stopped (02/18/22 1550)   metronidazole     PRN Meds: acetaminophen **OR** acetaminophen, calcium carbonate, labetalol, LORazepam, morphine injection, nitroGLYCERIN, ondansetron (ZOFRAN) IV   Vital Signs    Vitals:   02/18/22 1933 02/18/22 2320 02/19/22 0426 02/19/22 0734  BP: (!) 148/79 (!) 153/78 (!) 172/66 (!) 169/79  Pulse: 79 85 84 77  Resp: 18 18 18 15  $ Temp: 98.2 F (36.8 C) 98.3 F (36.8 C) 98 F (36.7 C) 98 F (36.7 C)  TempSrc: Oral     SpO2: 98% 96% 95% 97%  Weight:      Height:        Intake/Output Summary (Last 24 hours) at 02/19/2022 1001 Last data filed at 02/19/2022 0630 Gross per 24 hour  Intake 3523.13 ml  Output 1350 ml  Net 2173.13 ml      02/17/2022   11:53 AM 01/23/2022    1:58 PM 10/17/2021   11:32 AM  Last 3 Weights  Weight (lbs) 200 lb 200 lb 201 lb 9.6 oz  Weight (kg) 90.719 kg 90.719 kg 91.445 kg      Telemetry    NSR HR 70s, PVCs - Personally Reviewed  ECG    No new - Personally Reviewed  Physical Exam   GEN: No acute distress.   Neck: No JVD Cardiac: RRR, no murmurs, rubs, or gallops.  Respiratory: Clear to auscultation bilaterally. GI: Soft, nontender, non-distended  MS: No edema; No deformity. Neuro:  Nonfocal  Psych: Normal affect   Labs    High Sensitivity Troponin:   Recent Labs  Lab 02/17/22 1156 02/17/22 1425  TROPONINIHS 12 15     Chemistry Recent Labs  Lab 02/17/22 1156 02/17/22 1425 02/18/22 0325  02/19/22 0145  NA 138  --  140 139  K 3.8  --  3.6 3.5  CL 102  --  104 107  CO2 27  --  25 25  GLUCOSE 179*  --  109* 103*  BUN 22  --  21 13  CREATININE 1.02*  --  1.09* 0.96  CALCIUM 9.4  --  9.2 8.4*  PROT  --  7.6 6.0* 5.7*  ALBUMIN  --  4.2 3.4* 3.2*  AST  --  85* 43* 36  ALT  --  53* 36 33  ALKPHOS  --  111 83 77  BILITOT  --  1.8* 1.6* 1.5*  GFRNONAA 53*  --  49* 57*  ANIONGAP 9  --  11 7    Lipids No results for input(s): "CHOL", "TRIG", "HDL", "LABVLDL", "LDLCALC", "CHOLHDL" in the last 168 hours.  Hematology Recent Labs  Lab 02/17/22 1156 02/18/22 0325 02/19/22 0145  WBC 5.2 5.0 4.9  RBC 4.36 3.86* 3.71*  HGB 13.3 11.7* 11.4*  HCT 40.2 35.0* 34.2*  MCV 92.2 90.7 92.2  MCH 30.5 30.3 30.7  MCHC 33.1 33.4 33.3  RDW 12.4 12.6 12.5  PLT 174 158  153   Thyroid No results for input(s): "TSH", "FREET4" in the last 168 hours.  BNPNo results for input(s): "BNP", "PROBNP" in the last 168 hours.  DDimer No results for input(s): "DDIMER" in the last 168 hours.   Radiology    US Venous Img Lower Bilateral (DVT)  Result Date: 02/18/2022 CLINICAL DATA:  [id number] Leg swelling [id number] 99497 Leg pain 99497 EXAM: BILATERAL LOWER EXTREMITY VENOUS DOPPLER ULTRASOUND TECHNIQUE: Gray-scale sonography with compression, as well as color and duplex ultrasound, were performed to evaluate the deep venous system(s) from the level of the common femoral vein through the popliteal and proximal calf veins. COMPARISON:  None Available. FINDINGS: VENOUS Normal compressibility of the common femoral, superficial femoral, and popliteal veins, as well as the visualized calf veins. Visualized portions of profunda femoral vein and great saphenous vein unremarkable. No filling defects to suggest DVT on grayscale or color Doppler imaging. Doppler waveforms show normal direction of venous flow, normal respiratory plasticity and response to augmentation. OTHER None. Limitations: none IMPRESSION: Negative.  Electronically Signed   By: Lucrezia Europe M.D.   On: 02/18/2022 10:21   US Abdomen Limited RUQ (LIVER/GB)  Result Date: 02/18/2022 CLINICAL DATA:  Abdominal pain.  Elevated liver enzymes. EXAM: ULTRASOUND ABDOMEN LIMITED RIGHT UPPER QUADRANT COMPARISON:  CT abdomen and pelvis 02/17/2022 FINDINGS: Gallbladder: Surgically absent. Common bile duct: Diameter: 9 mm proximally. The mid and distal common bile duct was obscured by bowel gas, precluding assessment of the possible filling defect noted on CT. Liver: Mildly increased parenchymal echogenicity diffusely. Slightly irregular liver contour. No focal liver lesion identified. Portal vein is patent on color Doppler imaging with normal direction of blood flow towards the liver. Other: None. IMPRESSION: 1. Prior cholecystectomy. Mid to distal common bile duct obscured by bowel gas. 2. Mildly echogenic liver, nonspecific though can be seen with steatosis, infiltrative processes, and chronic hepatitis (with early cirrhosis not excluded given slightly nodular liver contour). Electronically Signed   By: Logan Bores M.D.   On: 02/18/2022 10:17   ECHOCARDIOGRAM COMPLETE  Result Date: 02/18/2022    ECHOCARDIOGRAM REPORT   Patient Name:   [name] Date of Exam: 02/17/2022 Medical Rec #:  [id number]    Height:       66.5 in Accession #:    [id number]   Weight:       200.0 lb Date of Birth:  08-01-1930    BSA:          2.011 m Patient Age:    89 years     BP:           157/88 mmHg Patient Gender: F            HR:           99 bpm. Exam Location:  [facility] Procedure: 2D Echo, Cardiac Doppler and Color Doppler Indications:     R07.9 Chest pain  History:         Patient has prior history of Echocardiogram examinations, most                  recent 09/24/2021. CAD, Prior CABG, TIA, Arrythmias:PVC; Risk                  Factors:Hypertension and Dyslipidemia.  Sonographer:     Cresenciano Lick RDCS Referring Phys:  [id number] AMY N [facility] Diagnosing Phys: Harrell Gave End MD  Sonographer  Comments: Technically difficult study due to poor echo windows. IMPRESSIONS  1. Left ventricular ejection fraction,  by estimation, is 60 to 65%. The left ventricle has normal function. The left ventricle has no regional wall motion abnormalities. Left ventricular diastolic parameters are consistent with Grade I diastolic dysfunction (impaired relaxation). Elevated left atrial pressure.  2. Right ventricular systolic function is normal. The right ventricular size is normal. There is normal pulmonary artery systolic pressure.  3. The mitral valve is degenerative. Mild to moderate mitral valve regurgitation. Mild mitral stenosis.  4. The aortic valve was not well visualized. There is mild calcification of the aortic valve. There is moderate thickening of the aortic valve. Aortic valve regurgitation is not visualized. Aortic valve sclerosis/calcification is present, without any evidence of aortic stenosis.  5. The inferior vena cava is normal in size with greater than 50% respiratory variability, suggesting right atrial pressure of 3 mmHg. FINDINGS  Left Ventricle: Left ventricular ejection fraction, by estimation, is 60 to 65%. The left ventricle has normal function. The left ventricle has no regional wall motion abnormalities. The left ventricular internal cavity size was normal in size. There is  no left ventricular hypertrophy. Left ventricular diastolic parameters are consistent with Grade I diastolic dysfunction (impaired relaxation). Elevated left atrial pressure. Right Ventricle: The right ventricular size is normal. No increase in right ventricular wall thickness. Right ventricular systolic function is normal. There is normal pulmonary artery systolic pressure. The tricuspid regurgitant velocity is 2.12 m/s, and  with an assumed right atrial pressure of 3 mmHg, the estimated right ventricular systolic pressure is Q000111Q mmHg. Left Atrium: Left atrial size was normal in size. Right Atrium: Right atrial size was  normal in size. Pericardium: There is no evidence of pericardial effusion. Mitral Valve: The mitral valve is degenerative in appearance. There is mild thickening of the mitral valve leaflet(s). There is mild calcification of the mitral valve leaflet(s). Mild to moderate mitral annular calcification. Mild to moderate mitral valve regurgitation. Mild mitral valve stenosis. The mean mitral valve gradient is 5.9 mmHg. Tricuspid Valve: The tricuspid valve is not well visualized. Tricuspid valve regurgitation is mild. Aortic Valve: The aortic valve was not well visualized. There is mild calcification of the aortic valve. There is moderate thickening of the aortic valve. Aortic valve regurgitation is not visualized. Aortic valve sclerosis/calcification is present, without any evidence of aortic stenosis. Pulmonic Valve: The pulmonic valve was not well visualized. Pulmonic valve regurgitation is not visualized. No evidence of pulmonic stenosis. Aorta: The aortic root was not well visualized. Pulmonary Artery: The pulmonary artery is not well seen. Venous: The inferior vena cava is normal in size with greater than 50% respiratory variability, suggesting right atrial pressure of 3 mmHg. IAS/Shunts: No atrial level shunt detected by color flow Doppler.  LEFT VENTRICLE PLAX 2D LVIDd:         4.00 cm   Diastology LVIDs:         2.50 cm   LV e' medial:    5.55 cm/s LV PW:         1.00 cm   LV E/e' medial:  20.2 LV IVS:        0.70 cm   LV e' lateral:   8.27 cm/s LVOT diam:     1.30 cm   LV E/e' lateral: 13.5 LV SV:         27 LV SV Index:   13 LVOT Area:     1.33 cm  RIGHT VENTRICLE             IVC RV Basal diam:  3.90  cm     IVC diam: 1.40 cm RV S prime:     11.40 cm/s TAPSE (M-mode): 1.8 cm LEFT ATRIUM             Index        RIGHT ATRIUM           Index LA diam:        3.50 cm 1.74 cm/m   RA Area:     12.80 cm LA Vol (A2C):   49.2 ml 24.47 ml/m  RA Volume:   28.30 ml  14.07 ml/m LA Vol (A4C):   42.2 ml 20.98 ml/m LA  Biplane Vol: 47.0 ml 23.37 ml/m  AORTIC VALVE LVOT Vmax:   114.33 cm/s LVOT Vmean:  79.200 cm/s LVOT VTI:    0.204 m MITRAL VALVE                TRICUSPID VALVE MV Area (PHT): 3.68 cm     TR Peak grad:   18.0 mmHg MV Area VTI:   0.97 cm     TR Vmax:        212.00 cm/s MV Mean grad:  5.9 mmHg MV VTI:        0.28 m       SHUNTS MV Decel Time: 206 msec     Systemic VTI:  0.20 m MV E velocity: 112.00 cm/s  Systemic Diam: 1.30 cm MV A velocity: 151.00 cm/s MV E/A ratio:  0.74 Harrell Gave End MD Electronically signed by Nelva Bush MD Signature Date/Time: 02/18/2022/7:06:15 AM    Final    CT Angio Chest PE W/Cm &/Or Wo Cm  Addendum Date: 02/17/2022   ADDENDUM REPORT: 02/17/2022 16:48 ADDENDUM: Imaging findings were discussed with Dr. Quentin Cornwall of the emergency department rather than Dr. Charna Archer as was outlined in the previous addendum. Nodules in the RIGHT lung base while favored to be infectious or inflammatory follow-up imaging is suggested to ensure resolution in this patient with history of lung cancer. This was also relayed via telephone, by me, at the time discussion. Electronically Signed   By: Zetta Bills M.D.   On: 02/17/2022 16:48   Addendum Date: 02/17/2022   ADDENDUM REPORT: 02/17/2022 16:11 ADDENDUM: These results were called by telephone at the time of interpretation on 02/17/2022 at 4:01 pm to provider [facility] , who verbally acknowledged these results. Electronically Signed   By: Zetta Bills M.D.   On: 02/17/2022 16:11   Result Date: 02/17/2022 CLINICAL DATA:  Abdominal pain.  History of RIGHT lung cancer. * Tracking Code: BO * EXAM: CT ANGIOGRAPHY CHEST WITH CONTRAST TECHNIQUE: Multidetector CT imaging of the chest was performed using the standard protocol during bolus administration of intravenous contrast. Multiplanar CT image reconstructions and MIPs were obtained to evaluate the vascular anatomy. RADIATION DOSE REDUCTION: This exam was performed according to the departmental  dose-optimization program which includes automated exposure control, adjustment of the mA and/or kV according to patient size and/or use of iterative reconstruction technique. CONTRAST:  178m OMNIPAQUE IOHEXOL 350 MG/ML SOLN COMPARISON:  November of 2019. FINDINGS: Cardiovascular: Calcified aortic atherosclerotic changes. Signs of median sternotomy. Dense material along the margin of the ascending thoracic aorta near the proximal arch related to dystrophic calcification or pledget material the setting of prior surgery. No signs of aortic dilation. Irregularity of ascending thoracic aorta with subtle suspected focal dissection perhaps related to prior aortic manipulation without substantial change as a subtle abnormality on prior imaging (image 172/4) Heart size mildly enlarged. Three-vessel  coronary artery calcification and signs of prior bypass. Study optimized for evaluation of pulmonary vascular bed which is opacified to between 490 and 600 Hounsfield units. Study is negative for pulmonary embolism. Mediastinum/Nodes: Mildly patulous esophagus. No thoracic inlet adenopathy. No axillary lymphadenopathy. No hilar adenopathy. Lungs/Pleura: Signs of pleural thickening along the posteromedial RIGHT chest following RIGHT lower lobectomy and along the anterolateral RIGHT chest with mild pleural thickening associated with calcification likely related to postoperative and post treatment changes. Trace pleural effusion at the RIGHT lung base is similar to prior imaging. No pneumothorax. No signs of dense consolidative process. Subtle tree in bud in the posterior RIGHT upper lobe (image 66/3) this is stable compared to previous imaging. Tree-in-bud opacities are also noted at the RIGHT lung base which are new compared to previous imaging in the largest area of nodularity is a discrete nodule along the pleural surface measuring approximately 5 x 7 mm (image 111/3) no additional signs of new nodule. There are other small  nodules along the pleural surface of the RIGHT lung. Upper Abdomen: Incidental imaging of upper abdominal contents with signs of pneumobilia. No acute upper abdominal findings. See dedicated abdominal report for further details. Abdominal contents Musculoskeletal: Spinal degenerative changes without acute or destructive bone process. Review of the MIP images confirms the above findings. IMPRESSION: 1. Study negative for pulmonary embolism. 2. This non gated study shows no definitive signs of opacification of the RIGHT coronary graft. Chronicity is uncertain but perhaps there was some contrast in the graft on previous imaging from 2021. Correlate with cardiac enzymes. 3. Signs of pleural thickening along the posteromedial RIGHT chest following RIGHT lower lobectomy and along the anterolateral RIGHT chest with mild pleural thickening associated with calcification likely related to postoperative and post treatment changes. Trace pleural effusion at the RIGHT lung base is similar to prior imaging. 4. Tree-in-bud opacities are noted at the RIGHT lung base which are new compared to previous imaging in the largest area of nodularity is a discrete nodule along the pleural surface measuring approximately 5 x 7 mm. Findings are concerning for infectious or inflammatory process. 5. Irregularity of ascending thoracic aorta with subtle suspected focal dissection perhaps related to prior aortic manipulation without substantial change as a subtle abnormality on prior imaging. Consider follow-up at 3-6 months to ensure stability but lack of stranding and thickening in this area and presence on prior imaging without change since 2021 6. Three-vessel coronary artery calcification and signs of prior bypass. 7. Aortic atherosclerosis. Aortic Atherosclerosis (ICD10-I70.0). A call is out to the referring provider to further discuss findings in the above case. Electronically Signed: By: Zetta Bills M.D. On: 02/17/2022 15:47   CT  Abdomen Pelvis W Contrast  Result Date: 02/17/2022 CLINICAL DATA:  Abdominal pain in a patient with history of lung cancer. Pain is radiating to the chest by report. * Tracking Code: BO * EXAM: CT ABDOMEN AND PELVIS WITH CONTRAST TECHNIQUE: Multidetector CT imaging of the abdomen and pelvis was performed using the standard protocol following bolus administration of intravenous contrast. RADIATION DOSE REDUCTION: This exam was performed according to the departmental dose-optimization program which includes automated exposure control, adjustment of the mA and/or kV according to patient size and/or use of iterative reconstruction technique. CONTRAST:  172m OMNIPAQUE IOHEXOL 350 MG/ML SOLN COMPARISON:  Abdominal imaging from 2019 in prior your CP Z. FINDINGS: Lower chest: Signs of mild cardiac to moderate cardiac enlargement, see dedicated chest CT for further details regarding chest findings. Patchy nodularity at  the RIGHT lung base dominant nodule approaching a cm in size. Hepatobiliary: Pneumobilia with mild peripheral peribiliary enhancement near the ampulla. CBD dilation is improved compared to 2019 at 13 mm as compared to 16 mm but there is a potential filling defect in the common bile duct measuring approximately 16 x 8 mm. No focal, suspicious hepatic lesion. Portal vein is patent. SMV is patent. Hepatic veins are patent. Pancreas: Mild atrophy of the pancreas without ductal dilation, inflammation or visible lesion. Spleen: Calcifications along the margin of the spleen, no focal lesion in the spleen. Spleen is not enlarged. Adrenals/Urinary Tract: Adrenal glands are normal. Symmetric renal enhancement without hydronephrosis or suspicious renal lesion. No thickening of the urinary bladder. No nephrolithiasis or ureteral calculi on this contrasted exam. Stomach/Bowel: Small hiatal hernia. No stranding adjacent to the stomach. No small bowel obstruction or inflammation. Appendix is not visible though there are no  secondary signs that would suggest acute appendicitis. No signs of colonic inflammation. Vascular/Lymphatic: Aortic atherosclerosis. No sign of aneurysm. Smooth contour of the IVC. There is no gastrohepatic or hepatoduodenal ligament lymphadenopathy. No retroperitoneal or mesenteric lymphadenopathy. No pelvic sidewall lymphadenopathy. Reproductive: Post hysterectomy.  No ascites. Other: No ascites. Small nodule in the RIGHT mesorectum is not substantially changed compared to imaging from 2019 (image 77/4) this measures approximately 8 mm and is been present to some extent since 2013 perhaps related to prior inflammation. Musculoskeletal: No acute bone finding. No destructive bone process. Spinal degenerative changes. IMPRESSION: 1. Pneumobilia with mild peripheral peribiliary enhancement near the ampulla. Correlate with any clinical or laboratory evidence of cholangitis. There is also a potential filling defect which may represent tumefactive sludge or a biliary calculus in the mid common bile duct. MRCP could be considered as warranted for further evaluation. Biliary duct dilation is improved compared to more remote imaging from 2019. 2. Patchy nodularity at the RIGHT lung base dominant nodule approaching a cm in size. Correlate with any history of recent infection or aspiration. Given discrete nodule at the RIGHT lung base would suggest follow-up in 8-12 weeks to exclude persistence in this patient with lung cancer. 3. Small hiatal hernia. 4. Aortic atherosclerosis. Aortic Atherosclerosis (ICD10-I70.0). Electronically Signed   By: Zetta Bills M.D.   On: 02/17/2022 16:00   DG Chest 2 View  Result Date: 02/17/2022 CLINICAL DATA:  cp EXAM: CHEST - 2 VIEW COMPARISON:  09/24/2021. FINDINGS: Cardiac silhouette is unremarkable. No pneumothorax or pleural effusion. The lungs are clear. Aorta ectatic and calcified. The visualized skeletal structures are unremarkable. Patient is status post median sternotomy and CABG.  IMPRESSION: No acute cardiopulmonary process. Electronically Signed   By: Sammie Bench M.D.   On: 02/17/2022 12:38    Cardiac Studies   Echo 02/17/22 1. Left ventricular ejection fraction, by estimation, is 60 to 65%. The  left ventricle has normal function. The left ventricle has no regional  wall motion abnormalities. Left ventricular diastolic parameters are  consistent with Grade I diastolic  dysfunction (impaired relaxation). Elevated left atrial pressure.   2. Right ventricular systolic function is normal. The right ventricular  size is normal. There is normal pulmonary artery systolic pressure.   3. The mitral valve is degenerative. Mild to moderate mitral valve  regurgitation. Mild mitral stenosis.   4. The aortic valve was not well visualized. There is mild calcification  of the aortic valve. There is moderate thickening of the aortic valve.  Aortic valve regurgitation is not visualized. Aortic valve  sclerosis/calcification is present, without  any  evidence of aortic stenosis.   5. The inferior vena cava is normal in size with greater than 50%  respiratory variability, suggesting right atrial pressure of 3 mmHg.    TTE (09/24/2021):  1. Left ventricular ejection fraction, by estimation, is 60 to 65%. The  left ventricle has normal function. The left ventricle has no regional  wall motion abnormalities. There is mild left ventricular hypertrophy.  Left ventricular diastolic parameters  are consistent with Grade I diastolic dysfunction (impaired relaxation).   2. Right ventricular systolic function is normal. The right ventricular  size is normal. There is normal pulmonary artery systolic pressure. The  estimated right ventricular systolic pressure is Q000111Q mmHg.   3. The mitral valve is normal in structure. Moderate mitral valve  regurgitation. No evidence of mitral stenosis.   4. Tricuspid valve regurgitation is moderate.   5. The aortic valve has an indeterminant number  of cusps. Aortic valve  regurgitation is not visualized. No aortic stenosis is present. Aortic  valve mean gradient measures 7.5 mmHg.   6. The inferior vena cava is normal in size with greater than 50%  respiratory variability, suggesting right atrial pressure of 3 mmHg.     Patient Profile     89 y.o. female with h/o CAD s/p PCI and subsequent CABG in 2018 (LIMA-LAD, SVG-OM, SVG-RPDA), TIA, bilateral carotid artery disease, HTN, HLD, and anxiety who is being seen for chest pain.    Assessment & Plan    Atypical chest pain CAD - patient reported epigastric pain that occurred after eating. No exertional pain reported.  - HS trop negative.  - IV heparin not started - Echo showed LVEF 60-65%, no WMA, G1DD, normal RVSF, normal pulmonary artery systolic pressure, mild to mod MR. - suspect his may be GI in nature - continue medical therapy with coreg   Abdominal pain - possibly 2/2 GI issue - LFTs mildly elevated - [place] RUQ this AM - GI consulted, ERCP today   HTN - BP mildly elevated - continue PTA Coreg and Amlodipine - can restart PTA telmisartan   Chronic HFpEF - euvolemic on exam - Echo showed normal LVEF - continue BB therapy - can restart PTA lasix 39m daily  For questions or updates, please contact CPrattPlease consult [email or web address] for contact info under        Signed, Tangee Marszalek HNinfa Meeker PA-C  02/19/2022, 10:01 AM

## 2022-02-19 NOTE — TOC Initial Note (Signed)
Transition of Care [facility]) - Initial/Assessment Note    Patient Details  Name: [name] MRN: [id number] Date of Birth: 02/13/1930  Transition of Care [facility]) CM/SW Contact:    Candie Chroman, LCSW Phone Number: 02/19/2022, 11:17 AM  Clinical Narrative:   CSW met with patient. No supports at bedside. CSW introduced role and explained that therapy recommendations would be discussed. Patient is not interested in home health at this time. Encouraged her to notify TOC prior to discharge if she changes her mind or notify her PCP if she changes her mind after discharge. Patient has a RW and SPC at home. No further concerns. CSW will continue to follow patient for support and facilitate return home once stable. Yolanda Bonine will likely transport her home at discharge.               Expected Discharge Plan: Home/Self Care Barriers to Discharge: Continued Medical Work up   Patient Goals and CMS Choice            Expected Discharge Plan and Services     Post Acute Care Choice: NA Living arrangements for the past 2 months: Single Family Home                                      Prior Living Arrangements/Services Living arrangements for the past 2 months: Single Family Home Lives with:: Adult Children Patient language and need for interpreter reviewed:: Yes Do you feel safe going back to the place where you live?: Yes      Need for Family Participation in Patient Care: Yes (Comment) Care giver support system in place?: Yes (comment) Current home services: DME Criminal Activity/Legal Involvement Pertinent to Current Situation/Hospitalization: No - Comment as needed  Activities of Daily Living Home Assistive Devices/Equipment: Eyeglasses ADL Screening (condition at time of admission) Patient's cognitive ability adequate to safely complete daily activities?: Yes Is the patient deaf or have difficulty hearing?: No Does the patient have difficulty seeing, even when wearing  glasses/contacts?: No Does the patient have difficulty concentrating, remembering, or making decisions?: No Patient able to express need for assistance with ADLs?: Yes Does the patient have difficulty dressing or bathing?: No Independently performs ADLs?: Yes (appropriate for developmental age) Does the patient have difficulty walking or climbing stairs?: No Weakness of Legs: None Weakness of Arms/Hands: None  Permission Sought/Granted                  Emotional Assessment Appearance:: Appears stated age Attitude/Demeanor/Rapport: Engaged Affect (typically observed): Appropriate, Calm, Pleasant Orientation: : Oriented to Self, Oriented to Place, Oriented to  Time, Oriented to Situation Alcohol / Substance Use: Not Applicable Psych Involvement: No (comment)  Admission diagnosis:  Chest pain [R07.9] Nonspecific chest pain [R07.9] Epigastric abdominal pain [R10.13] Patient Active Problem List   Diagnosis Date Noted   Nodule of lower lobe of right lung 02/18/2022   Abnormal CT of the chest 02/18/2022   Elevated liver enzymes 02/17/2022   Hyperlipidemia 02/17/2022   Epigastric abdominal pain 02/17/2022   History of cholangitis 02/17/2022   Obesity (BMI 30-39.9) 09/25/2021   Coronary artery disease of native artery of native heart with stable angina pectoris (HCC)    Acute on chronic diastolic CHF (congestive heart failure) ([place])    Anxiety 09/24/2021   Bigeminy    Physical deconditioning    History of ERCP 01/25/2018   History of biliary stent  insertion 01/25/2018   Coronary artery disease involving native coronary artery of native heart without angina pectoris    Acute cholangitis 12/08/2017   Elevated troponin 12/08/2017   Choledocholithiasis 12/08/2017   Mass of hand 05/19/2017   On intra-aortic balloon pump assist    NSTEMI (non-ST elevated myocardial infarction) ([place]) 12/22/2016   Weakness of lower extremity 12/09/2015   Viral URI 11/07/2015   Hypokalemia  11/07/2015   Cough    Diaphoresis    Sore throat    Non-ST elevation (NSTEMI) myocardial infarction ([place]) 12/29/2014   Angina pectoris ([place])    Coronary artery disease involving native coronary artery of native heart with unstable angina pectoris (HCC)    Emesis    Gastroenteritis, acute    PVC's (premature ventricular contractions) 04/25/2012   Cardiac arrest [facility])    Left carotid stenosis 03/01/2012   Leg fatigue 03/01/2012   Dysuria 03/30/2011   Mitral regurgitation 03/30/2011   Carotid bruit 05/28/2008   Essential hypertension 05/11/2008   Coronary atherosclerosis 05/11/2008   Angina at rest 05/11/2008   PCP:  [facility] [place]:   [id number] [place] [id number]: [phone]: [phone]    Social Determinants of Health (SDOH) Social History: SDOH Screenings   Food Insecurity: No Food Insecurity (02/19/2022)  Housing: Low Risk  (02/19/2022)  Transportation Needs: No Transportation Needs (02/19/2022)  Utilities: Not At Risk (02/19/2022)  Depression (PHQ2-9): Low Risk  (02/07/2018)  Physical Activity: Unknown (02/03/2018)  Social Connections: Unknown (02/03/2018)  Stress: No Stress Concern Present (02/03/2018)  Tobacco Use: Low Risk  (02/17/2022)   SDOH Interventions: Food Insecurity Interventions: Intervention Not Indicated Housing Interventions: Intervention Not Indicated Transportation Interventions: Intervention Not Indicated Utilities Interventions: Intervention Not Indicated   Readmission Risk Interventions     No data to display

## 2022-02-20 ENCOUNTER — Encounter: Payer: Self-pay | Admitting: Gastroenterology

## 2022-02-20 DIAGNOSIS — K311 Adult hypertrophic pyloric stenosis: Secondary | ICD-10-CM | POA: Diagnosis not present

## 2022-02-20 DIAGNOSIS — R079 Chest pain, unspecified: Secondary | ICD-10-CM | POA: Diagnosis not present

## 2022-02-20 DIAGNOSIS — Z9889 Other specified postprocedural states: Secondary | ICD-10-CM | POA: Diagnosis not present

## 2022-02-20 DIAGNOSIS — I2510 Atherosclerotic heart disease of native coronary artery without angina pectoris: Secondary | ICD-10-CM | POA: Diagnosis not present

## 2022-02-20 DIAGNOSIS — R1013 Epigastric pain: Secondary | ICD-10-CM | POA: Diagnosis not present

## 2022-02-20 DIAGNOSIS — R5381 Other malaise: Secondary | ICD-10-CM | POA: Diagnosis not present

## 2022-02-20 LAB — COMPREHENSIVE METABOLIC PANEL
ALT: 32 U/L (ref 0–44)
AST: 34 U/L (ref 15–41)
Albumin: 3.3 g/dL — ABNORMAL LOW (ref 3.5–5.0)
Alkaline Phosphatase: 78 U/L (ref 38–126)
Anion gap: 6 (ref 5–15)
BUN: 17 mg/dL (ref 8–23)
CO2: 25 mmol/L (ref 22–32)
Calcium: 8.1 mg/dL — ABNORMAL LOW (ref 8.9–10.3)
Chloride: 108 mmol/L (ref 98–111)
Creatinine, Ser: 0.94 mg/dL (ref 0.44–1.00)
GFR, Estimated: 58 mL/min — ABNORMAL LOW (ref >60)
Glucose, Bld: 97 mg/dL (ref 70–99)
Potassium: 4.3 mmol/L (ref 3.5–5.1)
Sodium: 139 mmol/L (ref 135–145)
Total Bilirubin: 1.4 mg/dL — ABNORMAL HIGH (ref 0.3–1.2)
Total Protein: 5.7 g/dL — ABNORMAL LOW (ref 6.5–8.1)

## 2022-02-20 LAB — RETICULOCYTES
Immature Retic Fract: 8 % (ref 2.3–15.9)
RBC.: 3.64 MIL/uL — ABNORMAL LOW (ref 3.87–5.11)
Retic Count, Absolute: 58.2 10*3/uL (ref 19.0–186.0)
Retic Ct Pct: 1.6 % (ref 0.4–3.1)

## 2022-02-20 LAB — MAGNESIUM: Magnesium: 2.3 mg/dL (ref 1.7–2.4)

## 2022-02-20 LAB — PHOSPHORUS: Phosphorus: 4.2 mg/dL (ref 2.5–4.6)

## 2022-02-20 LAB — IRON AND TIBC
Iron: 67 ug/dL (ref 28–170)
Saturation Ratios: 26 % (ref 10.4–31.8)
TIBC: 260 ug/dL (ref 250–450)
UIBC: 193 ug/dL

## 2022-02-20 LAB — FERRITIN: Ferritin: 72 ng/mL (ref 11–307)

## 2022-02-20 LAB — FOLATE: Folate: >40 ng/mL (ref >5.9)

## 2022-02-20 LAB — VITAMIN B12: Vitamin B-12: 898 pg/mL (ref 180–914)

## 2022-02-20 NOTE — Progress Notes (Signed)
Pt. Has been refusing carvedilol.  Pt. and Pt. Family/Care-taker wanted to know if they could continue to not take it.  Cyndia Skeeters, MD notified and has instructed them to reach out to their primary cardiologist for advice on this matter.  Pt. And Pt. Family informed of this.

## 2022-02-20 NOTE — Progress Notes (Signed)
Occupational Therapy Treatment Patient Details Name: [name] MRN: [id number] DOB: 1930/04/07 Today's Date: 02/20/2022   History of present illness Pt is an 89 year old female presenting to the ED with chest/abdominal pain; admitted fro management of possible choledocholithiasis (pending ERCP 02/19/22).  PMH significant for CAD s/p PCI and subsequent CABG in 2018 (LIMA-LAD, SVG-OM, SVG-RPDA), TIA, bilateral carotid artery disease, HTN, HLD, and anxiety   OT comments  Chart reviewed, pt greeted in bed agreeable to OT tx session. Tx session targeted improving functional activity tolerance in the setting of ADL tasks. Improvements noted in activity tolerance, pt stands at sink to complete grooming tasks for approx 8 minutes with supervision. Educated pt on ongoing use of RW for falls prevention, pt agrees. Pt continues to make progress towards goals, discharge recommendation remains appropriate.    Recommendations for follow up therapy are one component of a multi-disciplinary discharge planning process, led by the attending physician.  Recommendations may be updated based on patient status, additional functional criteria and insurance authorization.    Follow Up Recommendations  Home health OT     Assistance Recommended at Discharge Intermittent Supervision/Assistance  Patient can return home with the following  A little help with bathing/dressing/bathroom;A little help with walking and/or transfers;Assistance with cooking/housework;Direct supervision/assist for medications management;Help with stairs or ramp for entrance   Equipment Recommendations  None recommended by OT    Recommendations for Other Services      Precautions / Restrictions Precautions Precautions: Fall Restrictions Weight Bearing Restrictions: No       Mobility Bed Mobility Overal bed mobility: Needs Assistance Bed Mobility: Supine to Sit, Sit to Supine     Supine to sit: Supervision Sit to supine:  Supervision        Transfers Overall transfer level: Needs assistance Equipment used: Rolling walker (2 wheels) Transfers: Sit to/from Stand Sit to Stand: Supervision           General transfer comment: CGA for no AD     Balance Overall balance assessment: Needs assistance Sitting-balance support: No upper extremity supported, Feet supported Sitting balance-Leahy Scale: Good     Standing balance support: Bilateral upper extremity supported Standing balance-Leahy Scale: Fair                             ADL either performed or assessed with clinical judgement   ADL Overall ADL's : Needs assistance/impaired                                       General ADL Comments: oral care, washing face standing at sink for approx 8 minutes with RW with CGA, amb in room approx 15' 2 attempts with RW with supervision, amb with no AD approx 8' with CGA.    Extremity/Trunk Assessment              Vision       Perception     Praxis      Cognition Arousal/Alertness: Awake/alert Behavior During Therapy: WFL for tasks assessed/performed Overall Cognitive Status: Within Functional Limits for tasks assessed                                          Exercises      Shoulder Instructions  General Comments vital signs monitored, appear stable throughout    Pertinent Vitals/ Pain       Pain Assessment Pain Assessment: No/denies pain  Home Living                                          Prior Functioning/Environment              Frequency  Min 2X/week        Progress Toward Goals  OT Goals(current goals can now be found in the care plan section)  Progress towards OT goals: Progressing toward goals     Plan Discharge plan remains appropriate    Co-evaluation                 AM-PAC OT "6 Clicks" Daily Activity     Outcome Measure   Help from another person eating meals?:  None Help from another person taking care of personal grooming?: None Help from another person toileting, which includes using toliet, bedpan, or urinal?: None Help from another person bathing (including washing, rinsing, drying)?: A Little Help from another person to put on and taking off regular upper body clothing?: None Help from another person to put on and taking off regular lower body clothing?: A Little 6 Click Score: 22    End of Session Equipment Utilized During Treatment: Rolling walker (2 wheels)  OT Visit Diagnosis: Other abnormalities of gait and mobility (R26.89);Muscle weakness (generalized) (M62.81)   Activity Tolerance Patient tolerated treatment well   Patient Left in bed;with call bell/phone within reach;with bed alarm set   Nurse Communication Mobility status        Time: [id number] OT Time Calculation (min): 16 min  Charges: OT General Charges $OT Visit: 1 Visit OT Treatments $Self Care/Home Management : 8-22 mins  Shanon Payor, OTD OTR/L  02/20/22, 1:39 PM

## 2022-02-20 NOTE — Progress Notes (Signed)
Physical Therapy Treatment Patient Details Name: [name] MRN: [id number] DOB: 07-14-1930 Today's Date: 02/20/2022   History of Present Illness Pt is an 89 year old female presenting to the ED with chest/abdominal pain; admitted fro management of possible choledocholithiasis (pending ERCP 02/19/22).  PMH significant for CAD s/p PCI and subsequent CABG in 2018 (LIMA-LAD, SVG-OM, SVG-RPDA), TIA, bilateral carotid artery disease, HTN, HLD, and anxiety    PT Comments    Pt is making good progress towards goals with ability to ambulate with and without RW. Increased safety concerns as well as decreased balance without AD. Educated to use RW for assistance at all times for falls prevention in home environment. Pt is agreeable. Pt hopeful for dc today.  Recommendations for follow up therapy are one component of a multi-disciplinary discharge planning process, led by the attending physician.  Recommendations may be updated based on patient status, additional functional criteria and insurance authorization.  Follow Up Recommendations  Home health PT     Assistance Recommended at Discharge PRN  Patient can return home with the following A little help with walking and/or transfers;A little help with bathing/dressing/bathroom   Equipment Recommendations  None recommended by PT    Recommendations for Other Services       Precautions / Restrictions Precautions Precautions: Fall Restrictions Weight Bearing Restrictions: No     Mobility  Bed Mobility Overal bed mobility: Needs Assistance Bed Mobility: Supine to Sit, Sit to Supine     Supine to sit: Supervision Sit to supine: Min guard   General bed mobility comments: has difficulty with moving B LEs into bed as well as repositioning.    Transfers Overall transfer level: Needs assistance Equipment used: Rolling walker (2 wheels) Transfers: Sit to/from Stand Sit to Stand: Supervision           General transfer comment: cues for  hand placement. Once standing, steady with B UE support    Ambulation/Gait Ambulation/Gait assistance: Supervision Gait Distance (Feet): 150 Feet Assistive device: Rolling walker (2 wheels) Gait Pattern/deviations: Step-through pattern       General Gait Details: reciprocal gait pattern with safe technique. Additional 55' without AD with shuffle gait and high guard.   Stairs             Wheelchair Mobility    Modified Rankin (Stroke Patients Only)       Balance Overall balance assessment: Needs assistance Sitting-balance support: No upper extremity supported, Feet supported Sitting balance-Leahy Scale: Good     Standing balance support: Bilateral upper extremity supported Standing balance-Leahy Scale: Fair                              Cognition Arousal/Alertness: Awake/alert Behavior During Therapy: WFL for tasks assessed/performed Overall Cognitive Status: Within Functional Limits for tasks assessed                                          Exercises      General Comments        Pertinent Vitals/Pain Pain Assessment Pain Assessment: No/denies pain    Home Living                          Prior Function            PT Goals (current goals can  now be found in the care plan section) Acute Rehab PT Goals Patient Stated Goal: to go home PT Goal Formulation: With patient/family Time For Goal Achievement: 03/04/22 Potential to Achieve Goals: Good Progress towards PT goals: Progressing toward goals    Frequency    Min 2X/week      PT Plan Current plan remains appropriate    Co-evaluation              AM-PAC PT "6 Clicks" Mobility   Outcome Measure  Help needed turning from your back to your side while in a flat bed without using bedrails?: None Help needed moving from lying on your back to sitting on the side of a flat bed without using bedrails?: None Help needed moving to and from a bed to a  chair (including a wheelchair)?: None Help needed standing up from a chair using your arms (e.g., wheelchair or bedside chair)?: A Little Help needed to walk in hospital room?: A Little Help needed climbing 3-5 steps with a railing? : A Little 6 Click Score: 21    End of Session   Activity Tolerance: Patient tolerated treatment well Patient left: in bed;with call bell/phone within reach;with bed alarm set Nurse Communication: Mobility status PT Visit Diagnosis: Muscle weakness (generalized) (M62.81);Difficulty in walking, not elsewhere classified (R26.2)     Time: [id number] PT Time Calculation (min) (ACUTE ONLY): 10 min  Charges:  $Gait Training: 8-22 mins                     Greggory Stallion, PT, DPT, GCS [phone]    [name] 02/20/2022, 10:43 AM

## 2022-02-20 NOTE — Care Management Important Message (Signed)
Important Message  Patient Details  Name: [name] MRN: [id number] Date of Birth: 06/17/1930   Medicare Important Message Given:  N/A - LOS <3 / Initial given by admissions     Dannette Barbara 02/20/2022, 10:11 AM

## 2022-02-20 NOTE — Discharge Summary (Signed)
Physician Discharge Summary  [name] [id number] DOB: 10-14-1930 DOA: 02/17/2022  PCP: [name]  Admit date: 02/17/2022 Discharge date: 02/20/2022 Admitted From: Home Disposition: Home Recommendations for Outpatient Follow-up:  Follow up with PCP in 1 week Cardiology and GI to arrange outpatient follow-up Check CMP and CBC at follow-up Please follow up on the following pending results: None  Home Health: PT/OT Equipment/Devices: Rolling walker  Discharge Condition: Stable CODE STATUS: DNR/DNI  Follow-up [place], [place] @ [facility]. Schedule an appointment as soon as possible for a visit in 1 week(s).   Specialty: Family Medicine Contact information: [place] [address] [phone]                 Hospital course 89 year old F with PMH of CAD s/p CABG and PCI to LAD and mid LCx, lung cancer status post right lower lobe lobectomy, renal with cholecystectomy, cholangitis with CBD stone s/p ERCP in [id number], diastolic CHF, HTN, PVCs and anxiety presenting with acute abdominal pain across upper abdomen with associated shortness of breath and edema, and admitted for epigastric abdominal pain and abnormal EKG.  In ED, hemodynamically stable.  Afebrile.  Mildly elevated LFT.  No leukocytosis.  Serial troponin negative. EKG with ST/T changes in aVR and anterior leads.  CT angio chest negative for PE, tree-in-bud opacities in right lung base, 5 x 7 mm right lung nodule along the pleural surface and irregularity of ascending thoracic aorta.  CT abdomen and pelvis with pneumobilia, patchy nodularity at right lung base and right lung base nodule for which follow-up imaging in 8 to 12 weeks was recommended.  RUQ ultrasound and right lower extremity venous Doppler without acute finding to explain patient's symptoms.  Cardiology and GI consulted.    Patient's symptoms and LFT improved.  89 year old She underwent ERCP  with sphincterectomy and removal of choledocholithiasis and 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   On the day of discharge, symptoms resolved.  She tolerated solid diet.  She is cleared for discharge by GI and cardiology for outpatient follow-up.  See individual problem list below for more.   Problems addressed during this hospitalization Principal Problem:   Epigastric abdominal pain Active Problems:   Essential hypertension   Anxiety   Obesity (BMI 30-39.9)   Coronary atherosclerosis   Mitral regurgitation   Nonspecific chest pain   Coronary artery disease involving native coronary artery of native heart without angina pectoris   History of ERCP   History of biliary stent insertion   Physical deconditioning   Elevated liver enzymes   Hyperlipidemia   History of cholangitis   Nodule of lower lobe of right lung   Abnormal CT of the chest   Pyloric stenosis   Calculus of common duct   Epigastric abdominal pain due to choledocholithiasis: s/p ERCP and stone removal by GI.  Symptoms and LFT resolved.  Bilirubin improved. -Check repeat CMP at follow-up   Atypical chest pain/history of CAD/CABG and multiple stents: Likely due to the above.  Serial troponin negative.  Some abnormalities of EKG likely chronic.  Echocardiogram reassuring. -Cleared for discharge by cardiology   Essential hypertension: Normotensive. -Continue home meds   Elevated liver enzymes/hyperbilirubinemia:  RUQ [place] with mildly echogenic liver.  Acute hepatitis panel negative.    Anxiety: Stable -Continue home meds.   Abnormal CT chest/abdomen/pelvis: Showed new tree-in-bud opacities at right lung base.  Patient has no respiratory symptoms.  Received  ceftriaxone and Flagyl for choledocholithiasis while in-house.  Irregularity of ascending thoracic order -Follow-up imaging in 3 to 6 months recommended.   Pulmonary nodule: -Follow-up imaging outpatient.   Generalized weakness/ambulatory dysfunction:  Reports ambulating holding to something at home. -PT/OT/rolling walker ordered   Hypokalemia: Resolved -Monitor replenish as appropriate   Obesity: Body mass index is 31.8 kg/m.            Vital signs Vitals:   02/20/22 0400 02/20/22 0732 02/20/22 1000 02/20/22 1125  BP: 134/61 137/61  135/60  Pulse: 75 77  69  Temp: 98 F (36.7 C) 98.4 F (36.9 C)  98.5 F (36.9 C)  Resp: 20 16 15 18  $ Height:      Weight:      SpO2: 95% 93%  96%  TempSrc: Oral Oral  Oral  BMI (Calculated):         Discharge exam  GENERAL: No apparent distress.  Nontoxic. HEENT: MMM.  Vision and hearing grossly intact.  NECK: Supple.  No apparent JVD.  RESP:  No IWOB.  Fair aeration bilaterally. CVS:  RRR.  2/6 SEM over RUSB. ABD/GI/GU: BS+. Abd soft, NTND.  MSK/EXT:  Moves extremities. No apparent deformity. No edema.  SKIN: no apparent skin lesion or wound NEURO: Awake and alert. Oriented appropriately.  No apparent focal neuro deficit. PSYCH: Calm. Normal affect.   Discharge Instructions Discharge Instructions     Call MD for:  persistant nausea and vomiting   Complete by: As directed    Call MD for:  severe uncontrolled pain   Complete by: As directed    Diet - low sodium heart healthy   Complete by: As directed    Discharge instructions   Complete by: As directed    It has been a pleasure taking care of you!  You were hospitalized due to abdominal pain from biliary stone for which you have been treated with ERCP/stone extraction.  Your symptoms resolved.  Please follow-up with your primary care doctor in 1 to 2 weeks or sooner if needed.  Review your new medication list and the directions on the medication before you take them.   Take care,   Increase activity slowly   Complete by: As directed       Allergies as of 02/20/2022       Reactions   Sertraline Hcl Nausea Only   Biaxin [clarithromycin]    depressed   Celecoxib Other (See Comments)   Reaction: Unknown    Ciprofloxacin    Codeine Other (See Comments)   Reaction: Unknown   Ferrous Sulfate Other (See Comments)   Hydromorphone    Lisinopril    cough   Macrobid [nitrofurantoin Monohyd Macro] Other (See Comments)   Reaction: Unknown   Oxycodone Hcl Other (See Comments)   Reaction: Unknown   Prednisone    Sertraline Hcl Other (See Comments)   Reaction: Unknown   Penicillins Rash   Reaction: Unknown        Medication List     TAKE these medications    acetaminophen 325 MG tablet Commonly known as: TYLENOL Take 2 tablets (650 mg total) by mouth every 6 (six) hours as needed for mild pain or headache.   amLODipine 5 MG tablet Commonly known as: NORVASC Take 1 tablet (5 mg total) by mouth 2 (two) times daily. 1 tablet in the morning and half tablet at night.   aspirin 81 MG tablet Take 81 mg by mouth daily.   atorvastatin 40 MG tablet  Commonly known as: LIPITOR TAKE 1 TABLET BY MOUTH EVERY DAY   CALCIUM 600 + D PO Take 1 tablet by mouth daily. 600 mg/ 400 mg   calcium carbonate 750 MG chewable tablet Commonly known as: TUMS EX Chew 2 tablets by mouth as needed for heartburn.   carvedilol 12.5 MG tablet Commonly known as: COREG Take 0.5 tablet (6.25 mg) by mouth twice daily   estradiol 0.5 MG tablet Commonly known as: ESTRACE Take 0.5 mg by mouth daily.   famotidine 20 MG tablet Commonly known as: PEPCID Take 20 mg by mouth 2 (two) times daily.   furosemide 40 MG tablet Commonly known as: LASIX TAKE 1 TABLET BY MOUTH EVERY DAY   Klor-Con M20 20 MEQ tablet Generic drug: potassium chloride SA TAKE 1 TABLET BY MOUTH TWICE A DAY   LORazepam 0.5 MG tablet Commonly known as: ATIVAN Take 1 tablet (0.5 mg total) by mouth every 8 (eight) hours as needed for anxiety.   multivitamin tablet Take 1 tablet by mouth daily.   nitroGLYCERIN 0.4 MG SL tablet Commonly known as: NITROSTAT Place 1 tablet (0.4 mg total) under the tongue every 5 (five) minutes as needed for  chest pain.   telmisartan 80 MG tablet Commonly known as: MICARDIS TAKE 1 TABLET BY MOUTH EVERY DAY               Durable Medical Equipment  (From admission, onward)           Start     Ordered   02/19/22 2200  For home use only DME Walker rolling  Once       Question Answer Comment  Walker: With 5 Inch Wheels   Patient needs a walker to treat with the following condition Generalized weakness      02/19/22 2200            Consultations: Gastroenterology Cardiology  Procedures/Studies: ERCP with sphincterectomy and biliary stone removal Dilation of gastric stenosis at pylorus   DG C-Arm 1-60 Min-No Report  Result Date: 02/19/2022 Fluoroscopy was utilized by the requesting physician.  No radiographic interpretation.   US Venous Img Lower Bilateral (DVT)  Result Date: 02/18/2022 CLINICAL DATA:  [id number] Leg swelling 144481 99497 Leg pain 99497 EXAM: BILATERAL LOWER EXTREMITY VENOUS DOPPLER ULTRASOUND TECHNIQUE: Gray-scale sonography with compression, as well as color and duplex ultrasound, were performed to evaluate the deep venous system(s) from the level of the common femoral vein through the popliteal and proximal calf veins. COMPARISON:  None Available. FINDINGS: VENOUS Normal compressibility of the common femoral, superficial femoral, and popliteal veins, as well as the visualized calf veins. Visualized portions of profunda femoral vein and great saphenous vein unremarkable. No filling defects to suggest DVT on grayscale or color Doppler imaging. Doppler waveforms show normal direction of venous flow, normal respiratory plasticity and response to augmentation. OTHER None. Limitations: none IMPRESSION: Negative. Electronically Signed   By: Lucrezia Europe M.D.   On: 02/18/2022 10:21   US Abdomen Limited RUQ (LIVER/GB)  Result Date: 02/18/2022 CLINICAL DATA:  Abdominal pain.  Elevated liver enzymes. EXAM: ULTRASOUND ABDOMEN LIMITED RIGHT UPPER QUADRANT COMPARISON:  CT  abdomen and pelvis 02/17/2022 FINDINGS: Gallbladder: Surgically absent. Common bile duct: Diameter: 9 mm proximally. The mid and distal common bile duct was obscured by bowel gas, precluding assessment of the possible filling defect noted on CT. Liver: Mildly increased parenchymal echogenicity diffusely. Slightly irregular liver contour. No focal liver lesion identified. Portal vein is patent on color Doppler imaging  with normal direction of blood flow towards the liver. Other: None. IMPRESSION: 1. Prior cholecystectomy. Mid to distal common bile duct obscured by bowel gas. 2. Mildly echogenic liver, nonspecific though can be seen with steatosis, infiltrative processes, and chronic hepatitis (with early cirrhosis not excluded given slightly nodular liver contour). Electronically Signed   By: Logan Bores M.D.   On: 02/18/2022 10:17   ECHOCARDIOGRAM COMPLETE  Result Date: 02/18/2022    ECHOCARDIOGRAM REPORT   Patient Name:   [name] Date of Exam: 02/17/2022 Medical Rec #:  [id number]    Height:       66.5 in Accession #:    [id number]   Weight:       200.0 lb Date of Birth:  01/04/1931    BSA:          2.011 m Patient Age:    31 years     BP:           157/88 mmHg Patient Gender: F            HR:           99 bpm. Exam Location:  [facility] Procedure: 2D Echo, Cardiac Doppler and Color Doppler Indications:     R07.9 Chest pain  History:         Patient has prior history of Echocardiogram examinations, most                  recent 09/24/2021. CAD, Prior CABG, TIA, Arrythmias:PVC; Risk                  Factors:Hypertension and Dyslipidemia.  Sonographer:     Cresenciano Lick RDCS Referring Phys:  [id number] AMY N COX Diagnosing Phys: Harrell Gave End MD  Sonographer Comments: Technically difficult study due to poor echo windows. IMPRESSIONS  1. Left ventricular ejection fraction, by estimation, is 60 to 65%. The left ventricle has normal function. The left ventricle has no regional wall motion abnormalities. Left  ventricular diastolic parameters are consistent with Grade I diastolic dysfunction (impaired relaxation). Elevated left atrial pressure.  2. Right ventricular systolic function is normal. The right ventricular size is normal. There is normal pulmonary artery systolic pressure.  3. The mitral valve is degenerative. Mild to moderate mitral valve regurgitation. Mild mitral stenosis.  4. The aortic valve was not well visualized. There is mild calcification of the aortic valve. There is moderate thickening of the aortic valve. Aortic valve regurgitation is not visualized. Aortic valve sclerosis/calcification is present, without any evidence of aortic stenosis.  5. The inferior vena cava is normal in size with greater than 50% respiratory variability, suggesting right atrial pressure of 3 mmHg. FINDINGS  Left Ventricle: Left ventricular ejection fraction, by estimation, is 60 to 65%. The left ventricle has normal function. The left ventricle has no regional wall motion abnormalities. The left ventricular internal cavity size was normal in size. There is  no left ventricular hypertrophy. Left ventricular diastolic parameters are consistent with Grade I diastolic dysfunction (impaired relaxation). Elevated left atrial pressure. Right Ventricle: The right ventricular size is normal. No increase in right ventricular wall thickness. Right ventricular systolic function is normal. There is normal pulmonary artery systolic pressure. The tricuspid regurgitant velocity is 2.12 m/s, and  with an assumed right atrial pressure of 3 mmHg, the estimated right ventricular systolic pressure is [id number] mmHg. Left Atrium: Left atrial size was normal in size. Right Atrium: Right atrial size was normal in size. Pericardium: There is no evidence  of pericardial effusion. Mitral Valve: The mitral valve is degenerative in appearance. There is mild thickening of the mitral valve leaflet(s). There is mild calcification of the mitral valve leaflet(s).  Mild to moderate mitral annular calcification. Mild to moderate mitral valve regurgitation. Mild mitral valve stenosis. The mean mitral valve gradient is 5.9 mmHg. Tricuspid Valve: The tricuspid valve is not well visualized. Tricuspid valve regurgitation is mild. Aortic Valve: The aortic valve was not well visualized. There is mild calcification of the aortic valve. There is moderate thickening of the aortic valve. Aortic valve regurgitation is not visualized. Aortic valve sclerosis/calcification is present, without any evidence of aortic stenosis. Pulmonic Valve: The pulmonic valve was not well visualized. Pulmonic valve regurgitation is not visualized. No evidence of pulmonic stenosis. Aorta: The aortic root was not well visualized. Pulmonary Artery: The pulmonary artery is not well seen. Venous: The inferior vena cava is normal in size with greater than 50% respiratory variability, suggesting right atrial pressure of 3 mmHg. IAS/Shunts: No atrial level shunt detected by color flow Doppler.  LEFT VENTRICLE PLAX 2D LVIDd:         4.00 cm   Diastology LVIDs:         2.50 cm   LV e' medial:    5.55 cm/s LV PW:         1.00 cm   LV E/e' medial:  20.2 LV IVS:        0.70 cm   LV e' lateral:   8.27 cm/s LVOT diam:     1.30 cm   LV E/e' lateral: 13.5 LV SV:         27 LV SV Index:   13 LVOT Area:     1.33 cm  RIGHT VENTRICLE             IVC RV Basal diam:  3.90 cm     IVC diam: 1.40 cm RV S prime:     11.40 cm/s TAPSE (M-mode): 1.8 cm LEFT ATRIUM             Index        RIGHT ATRIUM           Index LA diam:        3.50 cm 1.74 cm/m   RA Area:     12.80 cm LA Vol (A2C):   49.2 ml 24.47 ml/m  RA Volume:   28.30 ml  14.07 ml/m LA Vol (A4C):   42.2 ml 20.98 ml/m LA Biplane Vol: 47.0 ml 23.37 ml/m  AORTIC VALVE LVOT Vmax:   114.33 cm/s LVOT Vmean:  79.200 cm/s LVOT VTI:    0.204 m MITRAL VALVE                TRICUSPID VALVE MV Area (PHT): 3.68 cm     TR Peak grad:   18.0 mmHg MV Area VTI:   0.97 cm     TR Vmax:         212.00 cm/s MV Mean grad:  5.9 mmHg MV VTI:        0.28 m       SHUNTS MV Decel Time: 206 msec     Systemic VTI:  0.20 m MV E velocity: 112.00 cm/s  Systemic Diam: 1.30 cm MV A velocity: 151.00 cm/s MV E/A ratio:  0.74 Harrell Gave End MD Electronically signed by Nelva Bush MD Signature Date/Time: 02/18/2022/7:06:15 AM    Final    CT Angio Chest PE W/Cm &/Or Wo Cm  Addendum Date: 02/17/2022   ADDENDUM REPORT: 02/17/2022 16:48 ADDENDUM: Imaging findings were discussed with Dr. Quentin Cornwall of the emergency department rather than Dr. Charna Archer as was outlined in the previous addendum. Nodules in the RIGHT lung base while favored to be infectious or inflammatory follow-up imaging is suggested to ensure resolution in this patient with history of lung cancer. This was also relayed via telephone, by me, at the time discussion. Electronically Signed   By: Zetta Bills M.D.   On: 02/17/2022 16:48   Addendum Date: 02/17/2022   ADDENDUM REPORT: 02/17/2022 16:11 ADDENDUM: These results were called by telephone at the time of interpretation on 02/17/2022 at 4:01 pm to provider [facility] , who verbally acknowledged these results. Electronically Signed   By: Zetta Bills M.D.   On: 02/17/2022 16:11   Result Date: 02/17/2022 CLINICAL DATA:  Abdominal pain.  History of RIGHT lung cancer. * Tracking Code: BO * EXAM: CT ANGIOGRAPHY CHEST WITH CONTRAST TECHNIQUE: Multidetector CT imaging of the chest was performed using the standard protocol during bolus administration of intravenous contrast. Multiplanar CT image reconstructions and MIPs were obtained to evaluate the vascular anatomy. RADIATION DOSE REDUCTION: This exam was performed according to the departmental dose-optimization program which includes automated exposure control, adjustment of the mA and/or kV according to patient size and/or use of iterative reconstruction technique. CONTRAST:  162m OMNIPAQUE IOHEXOL 350 MG/ML SOLN COMPARISON:  November of 2019. FINDINGS:  Cardiovascular: Calcified aortic atherosclerotic changes. Signs of median sternotomy. Dense material along the margin of the ascending thoracic aorta near the proximal arch related to dystrophic calcification or pledget material the setting of prior surgery. No signs of aortic dilation. Irregularity of ascending thoracic aorta with subtle suspected focal dissection perhaps related to prior aortic manipulation without substantial change as a subtle abnormality on prior imaging (image 172/4) Heart size mildly enlarged. Three-vessel coronary artery calcification and signs of prior bypass. Study optimized for evaluation of pulmonary vascular bed which is opacified to between 490 and 600 Hounsfield units. Study is negative for pulmonary embolism. Mediastinum/Nodes: Mildly patulous esophagus. No thoracic inlet adenopathy. No axillary lymphadenopathy. No hilar adenopathy. Lungs/Pleura: Signs of pleural thickening along the posteromedial RIGHT chest following RIGHT lower lobectomy and along the anterolateral RIGHT chest with mild pleural thickening associated with calcification likely related to postoperative and post treatment changes. Trace pleural effusion at the RIGHT lung base is similar to prior imaging. No pneumothorax. No signs of dense consolidative process. Subtle tree in bud in the posterior RIGHT upper lobe (image 66/3) this is stable compared to previous imaging. Tree-in-bud opacities are also noted at the RIGHT lung base which are new compared to previous imaging in the largest area of nodularity is a discrete nodule along the pleural surface measuring approximately 5 x 7 mm (image 111/3) no additional signs of new nodule. There are other small nodules along the pleural surface of the RIGHT lung. Upper Abdomen: Incidental imaging of upper abdominal contents with signs of pneumobilia. No acute upper abdominal findings. See dedicated abdominal report for further details. Abdominal contents Musculoskeletal:  Spinal degenerative changes without acute or destructive bone process. Review of the MIP images confirms the above findings. IMPRESSION: 1. Study negative for pulmonary embolism. 2. This non gated study shows no definitive signs of opacification of the RIGHT coronary graft. Chronicity is uncertain but perhaps there was some contrast in the graft on previous imaging from 2021. Correlate with cardiac enzymes. 3. Signs of pleural thickening along the posteromedial RIGHT chest following RIGHT  lower lobectomy and along the anterolateral RIGHT chest with mild pleural thickening associated with calcification likely related to postoperative and post treatment changes. Trace pleural effusion at the RIGHT lung base is similar to prior imaging. 4. Tree-in-bud opacities are noted at the RIGHT lung base which are new compared to previous imaging in the largest area of nodularity is a discrete nodule along the pleural surface measuring approximately 5 x 7 mm. Findings are concerning for infectious or inflammatory process. 5. Irregularity of ascending thoracic aorta with subtle suspected focal dissection perhaps related to prior aortic manipulation without substantial change as a subtle abnormality on prior imaging. Consider follow-up at 3-6 months to ensure stability but lack of stranding and thickening in this area and presence on prior imaging without change since 2021 6. Three-vessel coronary artery calcification and signs of prior bypass. 7. Aortic atherosclerosis. Aortic Atherosclerosis (ICD10-I70.0). A call is out to the referring provider to further discuss findings in the above case. Electronically Signed: By: Zetta Bills M.D. On: 02/17/2022 15:47   CT Abdomen Pelvis W Contrast  Result Date: 02/17/2022 CLINICAL DATA:  Abdominal pain in a patient with history of lung cancer. Pain is radiating to the chest by report. * Tracking Code: BO * EXAM: CT ABDOMEN AND PELVIS WITH CONTRAST TECHNIQUE: Multidetector CT imaging of  the abdomen and pelvis was performed using the standard protocol following bolus administration of intravenous contrast. RADIATION DOSE REDUCTION: This exam was performed according to the departmental dose-optimization program which includes automated exposure control, adjustment of the mA and/or kV according to patient size and/or use of iterative reconstruction technique. CONTRAST:  132m OMNIPAQUE IOHEXOL 350 MG/ML SOLN COMPARISON:  Abdominal imaging from 2019 in prior your CP Z. FINDINGS: Lower chest: Signs of mild cardiac to moderate cardiac enlargement, see dedicated chest CT for further details regarding chest findings. Patchy nodularity at the RIGHT lung base dominant nodule approaching a cm in size. Hepatobiliary: Pneumobilia with mild peripheral peribiliary enhancement near the ampulla. CBD dilation is improved compared to 2019 at 13 mm as compared to 16 mm but there is a potential filling defect in the common bile duct measuring approximately 16 x 8 mm. No focal, suspicious hepatic lesion. Portal vein is patent. SMV is patent. Hepatic veins are patent. Pancreas: Mild atrophy of the pancreas without ductal dilation, inflammation or visible lesion. Spleen: Calcifications along the margin of the spleen, no focal lesion in the spleen. Spleen is not enlarged. Adrenals/Urinary Tract: Adrenal glands are normal. Symmetric renal enhancement without hydronephrosis or suspicious renal lesion. No thickening of the urinary bladder. No nephrolithiasis or ureteral calculi on this contrasted exam. Stomach/Bowel: Small hiatal hernia. No stranding adjacent to the stomach. No small bowel obstruction or inflammation. Appendix is not visible though there are no secondary signs that would suggest acute appendicitis. No signs of colonic inflammation. Vascular/Lymphatic: Aortic atherosclerosis. No sign of aneurysm. Smooth contour of the IVC. There is no gastrohepatic or hepatoduodenal ligament lymphadenopathy. No retroperitoneal  or mesenteric lymphadenopathy. No pelvic sidewall lymphadenopathy. Reproductive: Post hysterectomy.  No ascites. Other: No ascites. Small nodule in the RIGHT mesorectum is not substantially changed compared to imaging from 2019 (image 77/4) this measures approximately 8 mm and is been present to some extent since 2013 perhaps related to prior inflammation. Musculoskeletal: No acute bone finding. No destructive bone process. Spinal degenerative changes. IMPRESSION: 1. Pneumobilia with mild peripheral peribiliary enhancement near the ampulla. Correlate with any clinical or laboratory evidence of cholangitis. There is also a potential filling defect  which may represent tumefactive sludge or a biliary calculus in the mid common bile duct. MRCP could be considered as warranted for further evaluation. Biliary duct dilation is improved compared to more remote imaging from 2019. 2. Patchy nodularity at the RIGHT lung base dominant nodule approaching a cm in size. Correlate with any history of recent infection or aspiration. Given discrete nodule at the RIGHT lung base would suggest follow-up in 8-12 weeks to exclude persistence in this patient with lung cancer. 3. Small hiatal hernia. 4. Aortic atherosclerosis. Aortic Atherosclerosis (ICD10-I70.0). Electronically Signed   By: Zetta Bills M.D.   On: 02/17/2022 16:00   DG Chest 2 View  Result Date: 02/17/2022 CLINICAL DATA:  cp EXAM: CHEST - 2 VIEW COMPARISON:  09/24/2021. FINDINGS: Cardiac silhouette is unremarkable. No pneumothorax or pleural effusion. The lungs are clear. Aorta ectatic and calcified. The visualized skeletal structures are unremarkable. Patient is status post median sternotomy and CABG. IMPRESSION: No acute cardiopulmonary process. Electronically Signed   By: Sammie Bench M.D.   On: 02/17/2022 12:38       The results of significant diagnostics from this hospitalization (including imaging, microbiology, ancillary and laboratory) are listed  below for reference.     Microbiology: No results found for this or any previous visit (from the past 240 hour(s)).   Labs:  CBC: Recent Labs  Lab 02/17/22 1156 02/18/22 0325 02/19/22 0145  WBC 5.2 5.0 4.9  NEUTROABS  --   --  2.9  HGB 13.3 11.7* 11.4*  HCT 40.2 35.0* 34.2*  MCV 92.2 90.7 92.2  PLT 174 158 153   BMP &GFR Recent Labs  Lab 02/17/22 1156 02/18/22 0325 02/19/22 0145 02/20/22 0504  NA 138 140 139 139  K 3.8 3.6 3.5 4.3  CL 102 104 107 108  CO2 27 25 25 25  $ GLUCOSE 179* 109* 103* 97  BUN 22 21 13 17  $ CREATININE 1.02* 1.09* 0.96 0.94  CALCIUM 9.4 9.2 8.4* 8.1*  MG  --   --   --  2.3  PHOS  --   --   --  4.2   Estimated Creatinine Clearance: 46.5 mL/min (by C-G formula based on SCr of 0.94 mg/dL). Liver & Pancreas: Recent Labs  Lab 02/17/22 1425 02/18/22 0325 02/19/22 0145 02/20/22 0504  AST 85* 43* 36 34  ALT 53* 36 33 32  ALKPHOS 111 83 77 78  BILITOT 1.8* 1.6* 1.5* 1.4*  PROT 7.6 6.0* 5.7* 5.7*  ALBUMIN 4.2 3.4* 3.2* 3.3*   Recent Labs  Lab 02/17/22 1425 02/19/22 0145  LIPASE 42 35   No results for input(s): "AMMONIA" in the last 168 hours. Diabetic: No results for input(s): "HGBA1C" in the last 72 hours. No results for input(s): "GLUCAP" in the last 168 hours. Cardiac Enzymes: No results for input(s): "CKTOTAL", "CKMB", "CKMBINDEX", "TROPONINI" in the last 168 hours. No results for input(s): "PROBNP" in the last 8760 hours. Coagulation Profile: No results for input(s): "INR", "PROTIME" in the last 168 hours. Thyroid Function Tests: No results for input(s): "TSH", "T4TOTAL", "FREET4", "T3FREE", "THYROIDAB" in the last 72 hours. Lipid Profile: No results for input(s): "CHOL", "HDL", "LDLCALC", "TRIG", "CHOLHDL", "LDLDIRECT" in the last 72 hours. Anemia Panel: Recent Labs    02/20/22 0504  VITAMINB12 898  FOLATE >40.0  FERRITIN 72  TIBC 260  IRON 67  RETICCTPCT 1.6   Urine analysis:    Component Value Date/Time    COLORURINE STRAW (A) 10/31/2018 1029   APPEARANCEUR CLEAR (A) 10/31/2018 1029  APPEARANCEUR Clear 11/03/2013 1527   LABSPEC 1.003 (L) 10/31/2018 1029   LABSPEC 1.006 11/03/2013 1527   PHURINE 7.0 10/31/2018 1029   GLUCOSEU NEGATIVE 10/31/2018 1029   GLUCOSEU Negative 11/03/2013 1527   GLUCOSEU NEGATIVE 03/30/2011 1306   HGBUR NEGATIVE 10/31/2018 [place] 10/31/2018 1029   BILIRUBINUR Negative 11/03/2013 1527   KETONESUR NEGATIVE 10/31/2018 1029   PROTEINUR NEGATIVE 10/31/2018 1029   UROBILINOGEN 0.2 03/30/2011 1306   NITRITE NEGATIVE 10/31/2018 [place] 10/31/2018 1029   LEUKOCYTESUR Negative 11/03/2013 1527   Sepsis Labs: Invalid input(s): "PROCALCITONIN", "LACTICIDVEN"   SIGNED:  Mercy Riding, MD  [facility] 02/20/2022, 4:42 PM

## 2022-02-24 DIAGNOSIS — H353122 Nonexudative age-related macular degeneration, left eye, intermediate dry stage: Secondary | ICD-10-CM | POA: Diagnosis not present

## 2022-02-24 DIAGNOSIS — H353212 Exudative age-related macular degeneration, right eye, with inactive choroidal neovascularization: Secondary | ICD-10-CM | POA: Diagnosis not present

## 2022-02-24 DIAGNOSIS — H4322 Crystalline deposits in vitreous body, left eye: Secondary | ICD-10-CM | POA: Diagnosis not present

## 2022-03-02 NOTE — Progress Notes (Unsigned)
I Cardiology Office Note  Date:  03/03/2022   ID:  [name], [name] February 05, 1930, MRN [id number]  PCP:  Kathalene Frames, MD   Chief Complaint  Patient presents with   [facility] follow up     Patient quit taking the Carvedilol due to a decrease in HR & feeling fatigue. Medications reviewed by the patient verbally.     HPI:  [name] is a 89 y.o. woman with  CAD,  prior PCI of the LAD in 2001 with non DES, PCI of mid LCX with non des  chronic chest pain episodes,   cardiac catheterization April 2014 showing occluded RCA, patent LAD and left circumflex and  50% carotid disease on the left.  Hospital December 2018 with chest pain, non-ST elevation MI Cardiac catheterization with critical left main disease, three-vessel disease Underwent CABG x3 Postop day 5 developed atrial fibrillation, given amiodarone, normal sinus rhythm restored LIMA to the LAD, vein graft to PDA, vein graft to OM Long history of GI issues, diarrhea, She presents today for follow-up of her coronary artery disease, frequent PVCs, hypertension, carotid arterial disease  LOV 5/23 Seen by one of our providers oct 23 and 1/24  In hospital 2/24 Abdominal discomfort, malaise, nausea  Underwent ERCP with sphincterectomy and removal of choledocholithiasis and 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  In follow-up reports that she feels better  Reports that she stopped carvedilol given measurements of heart rate in the 30s at home with fatigue/malaise.  Unable to exclude ectopy/PVCs as a contributor to incorrect measurement of pulse rate  Reports elevated blood pressure this morning, well-controlled in the clinic this afternoon  Previously on metoprolol succinate 50 for many years and was tolerating this well Was changed to tartrate at the time of discharge from the hospital September 2023 At a later date was changed to carvedilol  Admitted in September 2023 with chest discomfort. HS trop was negative x 2. Imdur  was started. BNP was 399 and was given IV lasix.   EKG personally reviewed by myself on todays visit Normal sinus rhythm rate 79 bpm nonspecific ST abnormality anterolateral leads  Echocardiogram performed November 2022 Left ventricular ejection fraction, by estimation, is 55 to 60%. The  left ventricle has normal function. The left ventricle has no regional  wall motion abnormalities. Left ventricular diastolic parameters are  consistent with Grade I diastolic  dysfunction (impaired relaxation).  Right ventricular systolic function is normal. The right ventricular  size is normal. There is normal pulmonary artery systolic pressure. The  estimated right ventricular systolic pressure is [id number] mmHg.   Other past medical history reviewed January 2020 admission to the hospital with chest pain, epigastric pain Hospital records reviewed with the patient in detail Evaluated by cardiology during her admission, symptoms felt to be GI in etiology, possibly related to gallbladder  In February 2020 she was noted to have gallbladder disease, stones for which she underwent ERCP.   echocardiogram November 2019 reviewed with her. It showed ejection fraction of 55 - 60%   emergency room 07/03/2017 for hypertension Blood pressure [id number] systolic at home  anxious  Long history of GI issues, diarrhea, has to take extra potassium daily Most recent potassium above 3.5 at the end of December 2016   Chronic leg weakness, does her ADLs, does not work in the garden anymore, unable to get up if she is on the ground   PMH:   has a past medical history of Anxiety, Atypical carcinoid lung tumor (  [place]), CAD (coronary artery disease), Cancer ([place]) (yrs ago her pt), Carotid disease, bilateral ([place]), Cataracts, bilateral, Cholangitis, Choledocholithiasis, Cholelithiases, Chronic diarrhea, Depression, Frequent PVCs, Gilbert's syndrome, Headache, History of echocardiogram, HTN (hypertension), Hyperlipidemia, Impaired  fasting blood sugar, Keloid, Macular degeneration, Osteoarthritis, Pneumonia, PONV (postoperative nausea and vomiting), Positional vertigo, Postoperative atrial fibrillation ([place]) (12/2016), S/P CABG x 3 (12/23/2016), TIA (transient ischemic attack), Wears glasses, and Wears hearing aid.  PSH:    Past Surgical History:  Procedure Laterality Date   ABDOMINAL AORTOGRAM N/A 12/22/2016   Procedure: ABDOMINAL AORTOGRAM;  Surgeon: Nelva Bush, MD;  Location: [facility];  Service: Cardiovascular;  Laterality: N/A;   ABDOMINAL HYSTERECTOMY     BILIARY STENT PLACEMENT  12/10/2017   Procedure: BILIARY STENT PLACEMENT;  Surgeon: Irving Copas., MD;  Location: [place];  Service: Gastroenterology;;   CHOLECYSTECTOMY     CORONARY ARTERY BYPASS GRAFT N/A 12/23/2016   Procedure: CORONARY ARTERY BYPASS GRAFTING (CABG) TIMES THREE USING LEFT INTERNAL MAMMARY ARTERY AND LEFT SAPHENOUS LEG VEIN HARVESTED ENDOSCOPICALLY,;  Surgeon: Rexene Alberts, MD;  Location: [place];  Service: Open Heart Surgery;  Laterality: N/A;   ENDOSCOPIC RETROGRADE CHOLANGIOPANCREATOGRAPHY (ERCP) WITH PROPOFOL N/A 02/21/2018   Procedure: ENDOSCOPIC RETROGRADE CHOLANGIOPANCREATOGRAPHY (ERCP) WITH PROPOFOL;  Surgeon: Rush Landmark Telford Nab., MD;  Location: [place];  Service: Gastroenterology;  Laterality: N/A;   ERCP N/A 12/10/2017   Procedure: ENDOSCOPIC RETROGRADE CHOLANGIOPANCREATOGRAPHY (ERCP);  Surgeon: Irving Copas., MD;  Location: [place];  Service: Gastroenterology;  Laterality: N/A;   ERCP N/A 02/19/2022   Procedure: ENDOSCOPIC RETROGRADE CHOLANGIOPANCREATOGRAPHY (ERCP);  Surgeon: Lucilla Lame, MD;  Location[facility] ENDOSCOPY;  Service: Endoscopy;  Laterality: N/A;   IABP INSERTION N/A 12/22/2016   Procedure: IABP Insertion;  Surgeon: Nelva Bush, MD;  Location: [place] [facility];  Service: Cardiovascular;  Laterality: N/A;   LEFT HEART CATH AND CORONARY ANGIOGRAPHY N/A 12/22/2016    Procedure: LEFT HEART CATH AND CORONARY ANGIOGRAPHY;  Surgeon: Nelva Bush, MD;  Location: [place] [facility];  Service: Cardiovascular;  Laterality: N/A;   LEFT HEART CATHETERIZATION WITH CORONARY ANGIOGRAM N/A 04/27/2012   Procedure: LEFT HEART CATHETERIZATION WITH CORONARY ANGIOGRAM;  Surgeon: Peter M Martinique, MD;  Location[facility] CATH LAB;  Service: Cardiovascular;  Laterality: N/A;   LOBECTOMY Right    RLL atypical carcinoid tumor - Dr Eugenia Mcalpine  1986   PCI of LAD  2001   with non des lad    pci of mid cfx     with non des and cutting balloon pci of av circumflex   REMOVAL OF STONES  02/21/2018   Procedure: REMOVAL OF STONES;  Surgeon: Irving Copas., MD;  Location: [place];  Service: Gastroenterology;;   Joan Mayans  02/21/2018   Procedure: Joan Mayans;  Surgeon: Irving Copas., MD;  Location: [place];  Service: Gastroenterology;;   Lavell Islam REMOVAL  02/21/2018   Procedure: STENT REMOVAL;  Surgeon: Irving Copas., MD;  Location: [place];  Service: Gastroenterology;;   TEE WITHOUT CARDIOVERSION N/A 12/23/2016   Procedure: TRANSESOPHAGEAL ECHOCARDIOGRAM (TEE);  Surgeon: Rexene Alberts, MD;  Location: [place];  Service: Open Heart Surgery;  Laterality: N/A;    Current Outpatient Medications  Medication Sig Dispense Refill   acetaminophen (TYLENOL) 325 MG tablet Take 2 tablets (650 mg total) by mouth every 6 (six) hours as needed for mild pain or headache.     amLODipine (NORVASC) 5 MG tablet Take 1 tablet (5 mg total) by mouth 2 (two) times  daily. 1 tablet in the morning and half tablet at night. 180 tablet 3   aspirin 81 MG tablet Take 81 mg by mouth daily.     atorvastatin (LIPITOR) 40 MG tablet TAKE 1 TABLET BY MOUTH EVERY DAY 90 tablet 0   calcium carbonate (TUMS EX) 750 MG chewable tablet Chew 2 tablets by mouth as needed for heartburn.     Calcium Carbonate-Vitamin D (CALCIUM 600 + D PO) Take 1 tablet by mouth daily. 600 mg/ 400 mg      estradiol (ESTRACE) 0.5 MG tablet Take 0.5 mg by mouth daily.     famotidine (PEPCID) 20 MG tablet Take 20 mg by mouth 2 (two) times daily.     furosemide (LASIX) 40 MG tablet TAKE 1 TABLET BY MOUTH EVERY DAY 90 tablet 0   KLOR-CON M20 20 MEQ tablet TAKE 1 TABLET BY MOUTH TWICE A DAY 180 tablet 0   LORazepam (ATIVAN) 0.5 MG tablet Take 1 tablet (0.5 mg total) by mouth every 8 (eight) hours as needed for anxiety. 30 tablet 0   Multiple Vitamin (MULTIVITAMIN) tablet Take 1 tablet by mouth daily.     nitroGLYCERIN (NITROSTAT) 0.4 MG SL tablet Place 1 tablet (0.4 mg total) under the tongue every 5 (five) minutes as needed for chest pain. 25 tablet 1   telmisartan (MICARDIS) 80 MG tablet TAKE 1 TABLET BY MOUTH EVERY DAY 90 tablet 0   No current facility-administered medications for this visit.     Allergies:   Sertraline hcl, Celecoxib, Ciprofloxacin, Clarithromycin, Codeine, Ferrous sulfate, Hydromorphone, Lisinopril, Macrobid [nitrofurantoin monohyd macro], Nitrofurantoin, Oxycodone hcl, Prednisone, Penicillin g, Penicillins, and Sertraline hcl   Social History:  The patient  reports that she has never smoked. She has never used smokeless tobacco. She reports that she does not drink alcohol and does not use drugs.   Family History:   family history includes CAD in her father and mother; Diabetes Mellitus I in her brother, mother, sister, and sister.    Review of Systems: Review of Systems  Constitutional: Negative.   Eyes: Negative.   Respiratory: Negative.  Negative for shortness of breath.   Cardiovascular: Negative.  Negative for chest pain.  Gastrointestinal: Negative.   Genitourinary: Negative.   Musculoskeletal: Negative.        Leg weakness  Neurological: Negative.   Psychiatric/Behavioral: Negative.    All other systems reviewed and are negative.   PHYSICAL EXAM: VS:  BP 136/68 (BP Location: Left Arm, Patient Position: Sitting, Cuff Size: Normal)   Pulse 79   Ht 5'  6.5" (1.689 m)   Wt 196 lb (88.9 kg)   SpO2 98%   BMI 31.16 kg/m  , BMI Body mass index is 31.16 kg/m.  Constitutional:  oriented to person, place, and time. No distress.  HENT:  Head: Grossly normal Eyes:  no discharge. No scleral icterus.  Neck: No JVD, no carotid bruits  Cardiovascular: Regular rate and rhythm, no murmurs appreciated Pulmonary/Chest: Clear to auscultation bilaterally, no wheezes or rails Abdominal: Soft.  no distension.  no tenderness.  Musculoskeletal: Normal range of motion Neurological:  normal muscle tone. Coordination normal. No atrophy Skin: Skin warm and dry Psychiatric: normal affect, pleasant  Recent Labs: 09/24/2021: B Natriuretic Peptide 399.2 01/23/2022: TSH 2.037 02/19/2022: Hemoglobin 11.4; Platelets 153 02/20/2022: ALT 32; BUN 17; Creatinine, Ser 0.94; Magnesium 2.3; Potassium 4.3; Sodium 139    Lipid Panel Lab Results  Component Value Date   CHOL 121 02/04/2018   HDL 47 02/04/2018  LDLCALC 41 02/04/2018   TRIG 167 (H) 02/04/2018    Wt Readings from Last 3 Encounters:  03/03/22 196 lb (88.9 kg)  02/17/22 200 lb (90.7 kg)  01/23/22 200 lb (90.7 kg)     ASSESSMENT AND PLAN:  Coronary artery disease involving native coronary artery of native heart with angina pectoris with documented spasm ([place])  CABG Three-vessel bypass surgery November 2018 Echocardiogram confirming normal ejection fraction  She stopped carvedilol as she was concerned about general malaise, weakness, measurements at home of bradycardia (likely secondary to PVCs) Recommend she restart metoprolol succinate 25 daily If she continues to have tachypalpitations at night we may need to go back to original dosing that she was on for many years, 50 mg daily  Essential hypertension  Will restart metoprolol succinate 25 daily as above Other medications will stay the same including telmisartan, amlodipine  PVC's (premature ventricular contractions) Will restart metoprolol  succinate.  Was on this for many years Appreciated bradycardia on carvedilol and stop the medication Suspect bradycardia measurements at home likely an error with measuring heart rate in the setting of PVCs  Left carotid stenosis Less than 39% blockage in the carotids Cholesterol at goal  Shortness of breath Deconditioned, weight running high, no regular exercise program, poor sleep Echocardiogram with normal ejection fraction No further ischemic workup at this time   Total encounter time more than 30 minutes  Greater than 50% was spent in counseling and coordination of care with the patient    No orders of the defined types were placed in this encounter.    Signed, Esmond Plants, M.D., Ph.D. 03/03/2022  [place], [place]

## 2022-03-03 ENCOUNTER — Ambulatory Visit: Payer: Medicare Other | Attending: Cardiovascular Disease | Admitting: Cardiovascular Disease

## 2022-03-03 ENCOUNTER — Encounter: Payer: Self-pay | Admitting: Cardiovascular Disease

## 2022-03-03 VITALS — BP 136/68 | HR 79 | Ht 66.5 in | Wt 196.0 lb

## 2022-03-03 DIAGNOSIS — I5032 Chronic diastolic (congestive) heart failure: Secondary | ICD-10-CM | POA: Diagnosis not present

## 2022-03-03 DIAGNOSIS — I25118 Atherosclerotic heart disease of native coronary artery with other forms of angina pectoris: Secondary | ICD-10-CM | POA: Insufficient documentation

## 2022-03-03 DIAGNOSIS — I10 Essential (primary) hypertension: Secondary | ICD-10-CM | POA: Diagnosis not present

## 2022-03-03 DIAGNOSIS — E782 Mixed hyperlipidemia: Secondary | ICD-10-CM | POA: Insufficient documentation

## 2022-03-03 DIAGNOSIS — Z951 Presence of aortocoronary bypass graft: Secondary | ICD-10-CM | POA: Diagnosis not present

## 2022-03-03 DIAGNOSIS — I6522 Occlusion and stenosis of left carotid artery: Secondary | ICD-10-CM | POA: Diagnosis not present

## 2022-03-03 DIAGNOSIS — R0602 Shortness of breath: Secondary | ICD-10-CM

## 2022-03-03 DIAGNOSIS — I739 Peripheral vascular disease, unspecified: Secondary | ICD-10-CM | POA: Diagnosis not present

## 2022-03-03 DIAGNOSIS — R531 Weakness: Secondary | ICD-10-CM | POA: Diagnosis not present

## 2022-03-03 MED ORDER — METOPROLOL SUCCINATE ER 25 MG PO TB24: 25.0000 mg | ORAL_TABLET | Freq: Every day | ORAL | 3 refills | Status: DC

## 2022-03-03 NOTE — Patient Instructions (Signed)
Medication Instructions:  Please start metoprolol succinate 25 mg daily Monitor heart rate and blood pressure  If you need a refill on your cardiac medications before your next appointment, please call your pharmacy.   Lab work: No new labs needed  Testing/Procedures: No new testing needed  Follow-Up: At [facility], you and your health needs are our priority.  As part of our continuing mission to provide you with exceptional heart care, we have created designated Provider Care Teams.  These Care Teams include your primary Cardiologist (physician) and Advanced Practice Providers (APPs -  Physician Assistants and Nurse Practitioners) who all work together to provide you with the care you need, when you need it.  You will need a follow up appointment in 6 months  Providers on your designated Care Team:   Murray Hodgkins, NP Christell Faith, PA-C Cadence Kathlen Mody, [place]  COVID-19 Vaccine Information can be found at: [email or web address].uk For questions related to vaccine distribution or appointments, please email [email or web address] .[email or web address] or call [phone].

## 2022-03-06 NOTE — Anesthesia Postprocedure Evaluation (Signed)
Anesthesia Post Note  Patient: [name]  Procedure(s) Performed: ENDOSCOPIC RETROGRADE CHOLANGIOPANCREATOGRAPHY (ERCP)  Patient location during evaluation: Endoscopy Anesthesia Type: General Level of consciousness: awake and alert Pain management: pain level controlled Vital Signs Assessment: post-procedure vital signs reviewed and stable Respiratory status: spontaneous breathing, nonlabored ventilation and respiratory function stable Cardiovascular status: blood pressure returned to baseline and stable Postop Assessment: no apparent nausea or vomiting Anesthetic complications: no   No notable events documented.   Last Vitals:  Vitals:   02/20/22 1000 02/20/22 1125  BP:  135/60  Pulse:  69  Resp: 15 18  Temp:  36.9 C  SpO2:  96%    Last Pain:  Vitals:   02/20/22 1125  TempSrc: Oral  PainSc:                  [name]

## 2022-03-23 ENCOUNTER — Other Ambulatory Visit: Payer: Self-pay | Admitting: Cardiovascular Disease

## 2022-05-08 ENCOUNTER — Other Ambulatory Visit: Payer: Medicare Other

## 2022-05-08 ENCOUNTER — Ambulatory Visit (INDEPENDENT_AMBULATORY_CARE_PROVIDER_SITE_OTHER): Payer: Medicare Other

## 2022-05-08 ENCOUNTER — Ambulatory Visit
Admission: EM | Admit: 2022-05-08 | Discharge: 2022-05-08 | Disposition: A | Discharge status: Home / Self Care | Payer: Medicare Other | Attending: Urgent Care | Admitting: Urgent Care

## 2022-05-08 DIAGNOSIS — R051 Acute cough: Secondary | ICD-10-CM

## 2022-05-08 DIAGNOSIS — J189 Pneumonia, unspecified organism: Secondary | ICD-10-CM

## 2022-05-08 DIAGNOSIS — R0602 Shortness of breath: Secondary | ICD-10-CM | POA: Diagnosis not present

## 2022-05-08 DIAGNOSIS — R059 Cough, unspecified: Secondary | ICD-10-CM | POA: Diagnosis not present

## 2022-05-08 MED ORDER — AZITHROMYCIN 250 MG PO TABS: ORAL_TABLET | ORAL | 0 refills | Status: DC

## 2022-05-08 MED ORDER — CEFPODOXIME PROXETIL 200 MG PO TABS: 200.0000 mg | ORAL_TABLET | Freq: Two times a day (BID) | ORAL | 0 refills | Status: AC

## 2022-05-08 NOTE — ED Triage Notes (Signed)
Patient presents to UC for cough and SOB x 4 days. Nasal congestion x 2 days, nausea since today. Not taking any OTC meds.

## 2022-05-08 NOTE — Discharge Instructions (Addendum)
Please follow up by scheduling a visit with your PCP within 1 week and 3-4 weeks repeat chest xray.

## 2022-05-08 NOTE — ED Provider Notes (Addendum)
[name]    CSN: [id number] Arrival date & time: 05/08/22  5621      History   Chief Complaint Chief Complaint  Patient presents with   Cough   Shortness of Breath   Nausea    HPI [name] is a 89 y.o. female.    Cough   Presents to urgent care with complaint of cough and SOB x 4 days. Endorses additional symptoms of nasal congestion x 2 days and nausea starting today.  PMH includes CAD with NSTEMI, CHF, R sided lung cancer s/p lobectomy.  Past Medical History:  Diagnosis Date   Anxiety    Atypical carcinoid lung tumor (HCC)    CAD (coronary artery disease)    a. 1986: s/p PTCA of bifurcation lesion in LAD;  b. 2001: s/p BMS to LAD & LCX;  c. 04/2006 Cath: diff CAD w/o critical narrowing, nl EF;  d. 12/2014 MV: inflat ST dep, mild basal inf ischemia, EF 55-65%-->Low risk; e. 12/2016 - 3V CABG.   Cancer [facility]) yrs ago her pt   right lobe of lung. surgery only.    Carotid disease, bilateral (HCC)    a. 08/2015 Carotid U/S: < 39% bilat ICA stenosis.   Cataracts, bilateral    Cholangitis    Choledocholithiasis    Cholelithiases    Chronic diarrhea    Depression    Frequent PVCs    Gilbert's syndrome    Headache    PMH: migraines   History of echocardiogram    a. 06/2010 Echo: EF 60-65%, mild AI/MR; b. 10/2015 Echo: EF 60-65%, no rwma, mild MR, nl RV fxn.   HTN (hypertension)    Hyperlipidemia    Impaired fasting blood sugar    Keloid    mid sternum   Macular degeneration    Osteoarthritis    Pneumonia    PONV (postoperative nausea and vomiting)    Positional vertigo    benign   Postoperative atrial fibrillation (HCC) 12/2016   S/P CABG x 3 12/23/2016   LIMA to LAD, SVG to OM, SVG to PDA, EVH via left thigh   TIA (transient ischemic attack)    Wears glasses    Wears hearing aid     Patient Active Problem List   Diagnosis Date Noted   Pyloric stenosis 02/19/2022   Calculus of common duct 02/19/2022   Nodule of lower lobe of right lung  02/18/2022   Abnormal CT of the chest 02/18/2022   Elevated liver enzymes 02/17/2022   Hyperlipidemia 02/17/2022   Epigastric abdominal pain 02/17/2022   History of cholangitis 02/17/2022   Obesity (BMI 30-39.9) 09/25/2021   Coronary artery disease of native artery of native heart with stable angina pectoris (HCC)    Acute on chronic diastolic CHF (congestive heart failure) (HCC)    Anxiety 09/24/2021   Bigeminy    Physical deconditioning    History of ERCP 01/25/2018   History of biliary stent insertion 01/25/2018   Coronary artery disease involving native coronary artery of native heart without angina pectoris    Acute cholangitis 12/08/2017   Elevated troponin 12/08/2017   Choledocholithiasis 12/08/2017   Mass of hand 05/19/2017   On intra-aortic balloon pump assist    NSTEMI (non-ST elevated myocardial infarction) (HCC) 12/22/2016   Weakness of lower extremity 12/09/2015   Viral URI 11/07/2015   Hypokalemia 11/07/2015   Cough    Diaphoresis    Sore throat    Non-ST elevation (NSTEMI) myocardial infarction (HCC) 12/29/2014  Nonspecific chest pain    Coronary artery disease involving native coronary artery of native heart with unstable angina pectoris (HCC)    Emesis    Gastroenteritis, acute    PVC's (premature ventricular contractions) 04/25/2012   Cardiac arrest [facility])    Left carotid stenosis 03/01/2012   Leg fatigue 03/01/2012   Dysuria 03/30/2011   Mitral regurgitation 03/30/2011   Carotid bruit 05/28/2008   Essential hypertension 05/11/2008   Coronary atherosclerosis 05/11/2008   Angina at rest 05/11/2008    Past Surgical History:  Procedure Laterality Date   ABDOMINAL AORTOGRAM N/A 12/22/2016   Procedure: ABDOMINAL AORTOGRAM;  Surgeon: Yvonne Kendall, MD;  Location: [facility] INVASIVE CV LAB;  Service: Cardiovascular;  Laterality: N/A;   ABDOMINAL HYSTERECTOMY     BILIARY STENT PLACEMENT  12/10/2017   Procedure: BILIARY STENT PLACEMENT;  Surgeon: Lemar Lofty., MD;  Location: [facility] ENDOSCOPY;  Service: Gastroenterology;;   CHOLECYSTECTOMY     CORONARY ARTERY BYPASS GRAFT N/A 12/23/2016   Procedure: CORONARY ARTERY BYPASS GRAFTING (CABG) TIMES THREE USING LEFT INTERNAL MAMMARY ARTERY AND LEFT SAPHENOUS LEG VEIN HARVESTED ENDOSCOPICALLY,;  Surgeon: Purcell Nails, MD;  Location: [facility] OR;  Service: Open Heart Surgery;  Laterality: N/A;   ENDOSCOPIC RETROGRADE CHOLANGIOPANCREATOGRAPHY (ERCP) WITH PROPOFOL N/A 02/21/2018   Procedure: ENDOSCOPIC RETROGRADE CHOLANGIOPANCREATOGRAPHY (ERCP) WITH PROPOFOL;  Surgeon: Meridee Score Netty Starring., MD;  Location: [facility] ENDOSCOPY;  Service: Gastroenterology;  Laterality: N/A;   ERCP N/A 12/10/2017   Procedure: ENDOSCOPIC RETROGRADE CHOLANGIOPANCREATOGRAPHY (ERCP);  Surgeon: Lemar Lofty., MD;  Location: [facility] ENDOSCOPY;  Service: Gastroenterology;  Laterality: N/A;   ERCP N/A 02/19/2022   Procedure: ENDOSCOPIC RETROGRADE CHOLANGIOPANCREATOGRAPHY (ERCP);  Surgeon: Midge Minium, MD;  Location: [facility] ENDOSCOPY;  Service: Endoscopy;  Laterality: N/A;   IABP INSERTION N/A 12/22/2016   Procedure: IABP Insertion;  Surgeon: Yvonne Kendall, MD;  Location: [facility] INVASIVE CV LAB;  Service: Cardiovascular;  Laterality: N/A;   LEFT HEART CATH AND CORONARY ANGIOGRAPHY N/A 12/22/2016   Procedure: LEFT HEART CATH AND CORONARY ANGIOGRAPHY;  Surgeon: Yvonne Kendall, MD;  Location: [facility] INVASIVE CV LAB;  Service: Cardiovascular;  Laterality: N/A;   LEFT HEART CATHETERIZATION WITH CORONARY ANGIOGRAM N/A 04/27/2012   Procedure: LEFT HEART CATHETERIZATION WITH CORONARY ANGIOGRAM;  Surgeon: Peter M Swaziland, MD;  Location: [facility] CATH LAB;  Service: Cardiovascular;  Laterality: N/A;   LOBECTOMY Right    RLL atypical carcinoid tumor - Dr Ronn Melena  1986   PCI of LAD  2001   with non des lad    pci of mid cfx     with non des and cutting balloon pci of av circumflex   REMOVAL OF STONES  02/21/2018   Procedure: REMOVAL OF STONES;   Surgeon: Lemar Lofty., MD;  Location: [facility] ENDOSCOPY;  Service: Gastroenterology;;   Dennison Mascot  02/21/2018   Procedure: Dennison Mascot;  Surgeon: Lemar Lofty., MD;  Location: [facility] ENDOSCOPY;  Service: Gastroenterology;;   Francine Graven REMOVAL  02/21/2018   Procedure: STENT REMOVAL;  Surgeon: Lemar Lofty., MD;  Location: [facility] ENDOSCOPY;  Service: Gastroenterology;;   TEE WITHOUT CARDIOVERSION N/A 12/23/2016   Procedure: TRANSESOPHAGEAL ECHOCARDIOGRAM (TEE);  Surgeon: Purcell Nails, MD;  Location: [facility] OR;  Service: Open Heart Surgery;  Laterality: N/A;    OB History   No obstetric history on file.      Home Medications    Prior to Admission medications   Medication Sig Start Date End Date Taking? Authorizing Provider  acetaminophen (TYLENOL) 325 MG tablet Take  2 tablets (650 mg total) by mouth every 6 (six) hours as needed for mild pain or headache. 01/02/17   Barrett, Erin R, PA-C  amLODipine (NORVASC) 5 MG tablet Take 1 tablet (5 mg total) by mouth 2 (two) times daily. 1 tablet in the morning and half tablet at night. 10/08/20   Antonieta Iba, MD  aspirin 81 MG tablet Take 81 mg by mouth daily.    [provider]  atorvastatin (LIPITOR) 40 MG tablet TAKE 1 TABLET BY MOUTH EVERY DAY 03/23/22   Antonieta Iba, MD  calcium carbonate (TUMS EX) 750 MG chewable tablet Chew 2 tablets by mouth as needed for heartburn.    [provider]  Calcium Carbonate-Vitamin D (CALCIUM 600 + D PO) Take 1 tablet by mouth daily. 600 mg/ 400 mg    [provider]  estradiol (ESTRACE) 0.5 MG tablet Take 0.5 mg by mouth daily. 10/04/19   [provider]  famotidine (PEPCID) 20 MG tablet Take 20 mg by mouth 2 (two) times daily.    [provider]  furosemide (LASIX) 40 MG tablet TAKE 1 TABLET BY MOUTH EVERY DAY 03/23/22   Gollan, Tollie Pizza, MD  KLOR-CON M20 20 MEQ tablet TAKE 1 TABLET BY MOUTH TWICE A DAY 02/11/22   Debbe Odea, MD   LORazepam (ATIVAN) 0.5 MG tablet Take 1 tablet (0.5 mg total) by mouth every 8 (eight) hours as needed for anxiety. 01/02/17   Barrett, Erin R, PA-C  metoprolol succinate (TOPROL-XL) 25 MG 24 hr tablet Take 1 tablet (25 mg total) by mouth daily. Take with or immediately following a meal. 03/03/22   Gollan, Tollie Pizza, MD  Multiple Vitamin (MULTIVITAMIN) tablet Take 1 tablet by mouth daily.    [provider]  nitroGLYCERIN (NITROSTAT) 0.4 MG SL tablet Place 1 tablet (0.4 mg total) under the tongue every 5 (five) minutes as needed for chest pain. 10/08/20   Antonieta Iba, MD  telmisartan (MICARDIS) 80 MG tablet TAKE 1 TABLET BY MOUTH EVERY DAY 03/23/22   Antonieta Iba, MD    Family History Family History  Problem Relation Age of Onset   Diabetes Mellitus I Mother    CAD Mother    CAD Father    Diabetes Mellitus I Sister    Diabetes Mellitus I Sister    Diabetes Mellitus I Brother    Colon cancer Neg Hx    Esophageal cancer Neg Hx    Inflammatory bowel disease Neg Hx    Liver disease Neg Hx    Pancreatic cancer Neg Hx    Rectal cancer Neg Hx    Stomach cancer Neg Hx     Social History Social History   Tobacco Use   Smoking status: Never   Smokeless tobacco: Never  Vaping Use   Vaping Use: Never used  Substance Use Topics   Alcohol use: No   Drug use: No     Allergies   Sertraline hcl, Celecoxib, Ciprofloxacin, Clarithromycin, Codeine, Ferrous sulfate, Hydromorphone, Lisinopril, Macrobid [nitrofurantoin monohyd macro], Nitrofurantoin, Oxycodone hcl, Prednisone, Penicillin g, Penicillins, and Sertraline hcl   Review of Systems Review of Systems  Respiratory:  Positive for cough.      Physical Exam Triage Vital Signs ED Triage Vitals  Enc Vitals Group     BP      Pulse      Resp      Temp      Temp src      SpO2  Weight      Height      Head Circumference      Peak Flow      Pain Score      Pain Loc      Pain Edu?      Excl. in GC?     No data found.  Updated Vital Signs BP 135/77 (BP Location: Left Arm)   Pulse 77   Temp 97.8 F (36.6 C) (Temporal)   Resp (!) 24   SpO2 94%   Visual Acuity Right Eye Distance:   Left Eye Distance:   Bilateral Distance:    Right Eye Near:   Left Eye Near:    Bilateral Near:     Physical Exam Vitals reviewed.  Constitutional:      Appearance: She is ill-appearing.  HENT:     Head: Normocephalic and atraumatic.     Mouth/Throat:     Mouth: Mucous membranes are moist.     Pharynx: No posterior oropharyngeal erythema.  Eyes:     Extraocular Movements: Extraocular movements intact.     Pupils: Pupils are equal, round, and reactive to light.  Cardiovascular:     Rate and Rhythm: Normal rate. Rhythm irregularly irregular.     Pulses: Normal pulses.     Heart sounds: Murmur heard.  Pulmonary:     Effort: Pulmonary effort is normal. No tachypnea.     Breath sounds: Examination of the right-upper field reveals rhonchi. Examination of the left-upper field reveals rhonchi. Rhonchi present.  Skin:    General: Skin is warm and dry.  Neurological:     General: No focal deficit present.     Mental Status: She is alert and oriented to person, place, and time.  Psychiatric:        Mood and Affect: Mood normal.        Behavior: Behavior normal.      UC Treatments / Results  Labs (all labs ordered are listed, but only abnormal results are displayed) Labs Reviewed - No data to display  EKG   Radiology No results found.  Procedures Procedures (including critical care time)  Medications Ordered in UC Medications - No data to display  Initial Impression / Assessment and Plan / UC Course  I have reviewed the triage vital signs and the nursing notes.  Pertinent labs & imaging results that were available during my care of the patient were reviewed by me and considered in my medical decision making (see chart for details).   [name] is a 89 y.o. female presenting  with acute cough and SOB. Patient is afebrile without recent antipyretics, satting well on room air. Overall is ill appearing though non-toxic, well hydrated, without respiratory distress. Pulmonary exam is remarkable for rhonchi auscultated in the upper lobes.    Chest xray was obtained out of abundance of caution. Review of previous imaging results performed by provided. Patient elected to wait for result at home because of delay in radiology read.  "New patchy airspace disease at the left lung base and possible focal opacity in the right perihilar region, suspicious for early pneumonia. Followup PA and lateral chest X-ray is recommended in 3-4 weeks following appropriate therapy to ensure resolution and exclude underlying malignancy."  Will prescribe antibiotic therapy to treat possible pneumonia. PCN allergy but cephalosporin previous tolerated. Clarithromycin allergy, but azithromycin previously tolerated.   Patient will schedule f/up within 1 week and 3-4 weeks for repeat xray to verify complete resolution vs malignancy.  Counseled patient on potential for adverse effects with medications prescribed/recommended today, ER and return-to-clinic precautions discussed, patient verbalized understanding and agreement with care plan.   Final Clinical Impressions(s) / UC Diagnoses   Final diagnoses:  Acute cough   Discharge Instructions   None    ED Prescriptions   None    PDMP not reviewed this encounter.   Charma Igo, FNP 05/08/22 1201    Charma Igo, FNP 05/08/22 1211    Charma Igo, FNP 05/08/22 1216

## 2022-05-10 ENCOUNTER — Emergency Department
Admission: EM | Admit: 2022-05-10 | Discharge: 2022-05-10 | Disposition: A | Discharge status: Home / Self Care | Payer: Medicare Other | Attending: Emergency Medicine | Admitting: Emergency Medicine

## 2022-05-10 ENCOUNTER — Other Ambulatory Visit: Payer: Self-pay

## 2022-05-10 ENCOUNTER — Emergency Department: Payer: Medicare Other

## 2022-05-10 ENCOUNTER — Encounter: Payer: Self-pay | Admitting: Emergency Medicine

## 2022-05-10 DIAGNOSIS — I700 Atherosclerosis of aorta: Secondary | ICD-10-CM | POA: Diagnosis not present

## 2022-05-10 DIAGNOSIS — Z1152 Encounter for screening for COVID-19: Secondary | ICD-10-CM | POA: Insufficient documentation

## 2022-05-10 DIAGNOSIS — R0602 Shortness of breath: Secondary | ICD-10-CM | POA: Insufficient documentation

## 2022-05-10 DIAGNOSIS — I509 Heart failure, unspecified: Secondary | ICD-10-CM | POA: Diagnosis not present

## 2022-05-10 DIAGNOSIS — Z85118 Personal history of other malignant neoplasm of bronchus and lung: Secondary | ICD-10-CM | POA: Insufficient documentation

## 2022-05-10 DIAGNOSIS — J40 Bronchitis, not specified as acute or chronic: Secondary | ICD-10-CM | POA: Diagnosis not present

## 2022-05-10 DIAGNOSIS — R059 Cough, unspecified: Secondary | ICD-10-CM | POA: Diagnosis present

## 2022-05-10 LAB — BRAIN NATRIURETIC PEPTIDE: B Natriuretic Peptide: 288 pg/mL — ABNORMAL HIGH (ref 0.0–100.0)

## 2022-05-10 LAB — BASIC METABOLIC PANEL
Anion gap: 8 (ref 5–15)
BUN: 18 mg/dL (ref 8–23)
CO2: 26 mmol/L (ref 22–32)
Calcium: 8.7 mg/dL — ABNORMAL LOW (ref 8.9–10.3)
Chloride: 105 mmol/L (ref 98–111)
Creatinine, Ser: 1.16 mg/dL — ABNORMAL HIGH (ref 0.44–1.00)
GFR, Estimated: 45 mL/min — ABNORMAL LOW (ref >60)
Glucose, Bld: 136 mg/dL — ABNORMAL HIGH (ref 70–99)
Potassium: 3.7 mmol/L (ref 3.5–5.1)
Sodium: 139 mmol/L (ref 135–145)

## 2022-05-10 LAB — CBC
HCT: 39.1 % (ref 36.0–46.0)
Hemoglobin: 12.9 g/dL (ref 12.0–15.0)
MCH: 30.7 pg (ref 26.0–34.0)
MCHC: 33 g/dL (ref 30.0–36.0)
MCV: 93.1 fL (ref 80.0–100.0)
Platelets: 163 10*3/uL (ref 150–400)
RBC: 4.2 MIL/uL (ref 3.87–5.11)
RDW: 12.9 % (ref 11.5–15.5)
WBC: 5 10*3/uL (ref 4.0–10.5)
nRBC: 0 % (ref 0.0–0.2)

## 2022-05-10 LAB — TROPONIN I (HIGH SENSITIVITY)
Troponin I (High Sensitivity): 7 ng/L (ref <18)
Troponin I (High Sensitivity): 5 ng/L (ref <18)

## 2022-05-10 LAB — SARS CORONAVIRUS 2 BY RT PCR: SARS Coronavirus 2 by RT PCR: NEGATIVE

## 2022-05-10 MED ORDER — IPRATROPIUM-ALBUTEROL 0.5-2.5 (3) MG/3ML IN SOLN
3.0000 mL | Freq: Once | RESPIRATORY_TRACT | Status: AC
  Administered 2022-05-10: 3 mL via RESPIRATORY_TRACT
  Filled 2022-05-10: qty 3

## 2022-05-10 MED ORDER — IOHEXOL 350 MG/ML SOLN
75.0000 mL | Freq: Once | INTRAVENOUS | Status: AC | PRN
  Administered 2022-05-10: 75 mL via INTRAVENOUS

## 2022-05-10 MED ORDER — BENZONATATE 100 MG PO CAPS: 100.0000 mg | ORAL_CAPSULE | Freq: Three times a day (TID) | ORAL | 0 refills | Status: DC | PRN

## 2022-05-10 MED ORDER — ALBUTEROL SULFATE HFA 108 (90 BASE) MCG/ACT IN AERS: 2.0000 | INHALATION_SPRAY | Freq: Four times a day (QID) | RESPIRATORY_TRACT | 2 refills | Status: DC | PRN

## 2022-05-10 NOTE — ED Notes (Signed)
Patient transported to CT 

## 2022-05-10 NOTE — ED Provider Notes (Signed)
[facility] Provider Note    Event Date/Time   First MD Initiated Contact with Patient 05/10/22 1508     (approximate)   History   Cough   HPI  [name] is a 89 y.o. female with history of coronary disease, CHF, right-sided lung cancer status post lobectomy who comes in with concern for cough.  On review of records patient was seen on 4/26 at a urgent care complaining of cough and shortness of breath for the past 4 days-x-ray Is concerning for early pneumonia and patient was prescribed antibiotics with azithromycin and cefpodoxime.  However she reports worsening symptoms even with treatment.  Reports that she has had worsening shortness of breath, wheezing.  She reports increasing weakness.  She denies any falls or hitting her headShe lives at home with her family members who have been able to assist her.  She reports that the shortness of breath and wheezing seems worse at nighttime.  They are associate with a lot of cough and congestion.  She states that she does not really have the symptoms right now that is more when she is trying to sleep she denies any worsening swelling in her legs.  She reports being compliant with all of her medications and she is on Lasix to 40 mg daily.  She denies any abdominal pain worsening leg swelling.   Reviewed patient's admission summary where she was admitted for atypical chest pain choledocholithiasis had an abnormal EKG that was thought to be chronic in nature with reassuring echocardiogram and she was cleared by cardiology.   Physical Exam   Triage Vital Signs: ED Triage Vitals  Enc Vitals Group     BP 05/10/22 1323 129/75     Pulse Rate 05/10/22 1323 69     Resp 05/10/22 1323 20     Temp 05/10/22 1323 98 F (36.7 C)     Temp src --      SpO2 05/10/22 1323 98 %     Weight 05/10/22 1324 230 lb (104.3 kg)     Height 05/10/22 1324 5' 6.5" (1.689 m)     Head Circumference --      Peak Flow --      Pain Score  05/10/22 1322 5     Pain Loc --      Pain Edu? --      Excl. in GC? --     Most recent vital signs: Vitals:   05/10/22 1323  BP: 129/75  Pulse: 69  Resp: 20  Temp: 98 F (36.7 C)  SpO2: 98%     General: Awake, no distress.  CV:  Good peripheral perfusion.  Resp:  Normal effort.  Rhonchi bilaterally Abd:  No distention.  Soft nontender Other:  No significant swelling in legs.  No calf tenderness.   ED Results / Procedures / Treatments   Labs (all labs ordered are listed, but only abnormal results are displayed) Labs Reviewed  BASIC METABOLIC PANEL - Abnormal; Notable for the following components:      Result Value   Glucose, Bld 136 (*)    Creatinine, Ser 1.16 (*)    Calcium 8.7 (*)    GFR, Estimated 45 (*)    All other components within normal limits  CBC  TROPONIN I (HIGH SENSITIVITY)  TROPONIN I (HIGH SENSITIVITY)     EKG  My interpretation of EKG:  Normal sinus 81 without any ST elevation, ST depressions in the inferior lateral leads without any T  wave inversions, occasional PAC, normal intervals-reviewed prior EKG from February 6 where she has had similar ST depressions.  RADIOLOGY I have reviewed the xray personally and interpreted and there are some possible tiny pleural effusions   PROCEDURES:  Critical Care performed: No  Procedures   MEDICATIONS ORDERED IN ED: Medications - No data to display   IMPRESSION / MDM / ASSESSMENT AND PLAN / ED COURSE  I reviewed the triage vital signs and the nursing notes.   Patient's presentation is most consistent with acute presentation with potential threat to life or bodily function.   Patient comes in with some shortness of breath and possible congestion and wheezing.  On examination she looks very comfortable with normal saturation she is got some mild wheezing noted.  We discussed doing a CT scan to evaluate for anything else that could be causing symptoms given patient reports symptoms.  Her EKG has  some ST depressions in it but I reviewed her prior EKGs and she has had this similarly and when she is been admitted previously she has been seen by cardiology and that thought things were chronic in nature with negative troponins and reassuring echo no interventions were done.  She denies any chest pain.  It has more just the shortness of breath congestion and wheezing at nighttime.  Troponin negative.  BMP shows slightly rising creatinine.  CBC is normal  Repeat troponin is negative.  COVID test was negative.  BNP is down from prior.  IMPRESSION: 1. No evidence for acute pulmonary embolism. 2. Status post right lower lobectomy. 3. Unchanged appearance of subpleural nodularity along the medial right lung base. 4.  Aortic Atherosclerosis (ICD10-I70.0).   Considered admission but patient's repeat troponin is negative her CT is negative and her ambulatory sat is normal.  5:12 PM repeat evaluation patient sats are still very reassuring at 99% she looks very comfortably sitting there.  Repeat abdominal exam soft nontender.  Discussed CT imaging.  We discussed admission versus going home but given reassuring workup patient feels comfortable going home will trial some Tessalon Perles, albuterol inhaler suspect this is most likely a viral bronchitis but she is already on antibiotics and I encouraged her to complete this course and if symptoms are worsening to return to the ER  The patient is on the cardiac monitor to evaluate for evidence of arrhythmia and/or significant heart rate changes.      FINAL CLINICAL IMPRESSION(S) / ED DIAGNOSES   Final diagnoses:  Bronchitis     Rx / DC Orders   ED Discharge Orders          Ordered    albuterol (VENTOLIN HFA) 108 (90 Base) MCG/ACT inhaler  Every 6 hours PRN        05/10/22 1713    benzonatate (TESSALON PERLES) 100 MG capsule  3 times daily PRN        05/10/22 1713             Note:  This document was prepared using Dragon voice  recognition software and may include unintentional dictation errors.   Concha Se, MD 05/10/22 [phone]

## 2022-05-10 NOTE — Discharge Instructions (Addendum)
Her workup was reassuring however I suspect this could be a viral bronchitis but she should still continue the antibiotics and complete this course.  Return to the ER if she develops worsening shortness of breath, fevers,  or any other concerns  IMPRESSION: 1. No evidence for acute pulmonary embolism. 2. Status post right lower lobectomy. 3. Unchanged appearance of subpleural nodularity along the medial right lung base. 4.  Aortic Atherosclerosis (ICD10-I70.0).

## 2022-05-10 NOTE — ED Triage Notes (Signed)
Pt via POV from home. Pt was seen at [facility] on 4/26, dx with PNA and started on abx but states medication has not helped. Per pt, states that she has been more fatigued, wheezing, and SOB. Denies hx of COPD/CHF. Denies pain. Pt is A&Ox4 and NAD

## 2022-05-10 NOTE — ED Notes (Signed)
Assessed pt walking with pulse oximetry. Pt able to maintain oxygen saturation of 97% with ambulation. Pt denies any feeling of SOB. Dr. Fuller Plan made aware.

## 2022-05-11 ENCOUNTER — Other Ambulatory Visit: Payer: Self-pay

## 2022-05-11 MED ORDER — POTASSIUM CHLORIDE CRYS ER 20 MEQ PO TBCR: 20.0000 meq | EXTENDED_RELEASE_TABLET | Freq: Two times a day (BID) | ORAL | 0 refills | Status: DC

## 2022-05-13 DIAGNOSIS — F3340 Major depressive disorder, recurrent, in remission, unspecified: Secondary | ICD-10-CM | POA: Diagnosis not present

## 2022-05-13 DIAGNOSIS — I129 Hypertensive chronic kidney disease with stage 1 through stage 4 chronic kidney disease, or unspecified chronic kidney disease: Secondary | ICD-10-CM | POA: Diagnosis not present

## 2022-05-13 DIAGNOSIS — I2510 Atherosclerotic heart disease of native coronary artery without angina pectoris: Secondary | ICD-10-CM | POA: Diagnosis not present

## 2022-05-13 DIAGNOSIS — N1831 Chronic kidney disease, stage 3a: Secondary | ICD-10-CM | POA: Diagnosis not present

## 2022-05-13 DIAGNOSIS — K219 Gastro-esophageal reflux disease without esophagitis: Secondary | ICD-10-CM | POA: Diagnosis not present

## 2022-05-13 DIAGNOSIS — E7800 Pure hypercholesterolemia, unspecified: Secondary | ICD-10-CM | POA: Diagnosis not present

## 2022-05-13 DIAGNOSIS — I272 Pulmonary hypertension, unspecified: Secondary | ICD-10-CM | POA: Diagnosis not present

## 2022-05-22 DIAGNOSIS — J40 Bronchitis, not specified as acute or chronic: Secondary | ICD-10-CM | POA: Diagnosis not present

## 2022-08-12 ENCOUNTER — Other Ambulatory Visit: Payer: Self-pay | Admitting: Cardiovascular Disease

## 2022-08-12 NOTE — Telephone Encounter (Signed)
Good Morning,  Could you please schedule this patient a 6 month follow up visit? The patient was last seen by Dr. Mariah Milling on 03-03-22. Thank you so much.

## 2022-08-13 NOTE — Telephone Encounter (Signed)
Pt scheduled on 1/04

## 2022-08-25 DIAGNOSIS — K429 Umbilical hernia without obstruction or gangrene: Secondary | ICD-10-CM | POA: Diagnosis not present

## 2022-08-25 DIAGNOSIS — F5104 Psychophysiologic insomnia: Secondary | ICD-10-CM | POA: Diagnosis not present

## 2022-08-25 DIAGNOSIS — H353122 Nonexudative age-related macular degeneration, left eye, intermediate dry stage: Secondary | ICD-10-CM | POA: Diagnosis not present

## 2022-08-25 DIAGNOSIS — R21 Rash and other nonspecific skin eruption: Secondary | ICD-10-CM | POA: Diagnosis not present

## 2022-08-25 DIAGNOSIS — B354 Tinea corporis: Secondary | ICD-10-CM | POA: Diagnosis not present

## 2022-08-25 DIAGNOSIS — H353212 Exudative age-related macular degeneration, right eye, with inactive choroidal neovascularization: Secondary | ICD-10-CM | POA: Diagnosis not present

## 2022-08-25 DIAGNOSIS — H4322 Crystalline deposits in vitreous body, left eye: Secondary | ICD-10-CM | POA: Diagnosis not present

## 2022-09-24 ENCOUNTER — Other Ambulatory Visit: Payer: Self-pay | Admitting: Cardiovascular Disease

## 2022-10-16 ENCOUNTER — Encounter: Payer: Self-pay | Admitting: Medical

## 2022-10-16 ENCOUNTER — Ambulatory Visit: Payer: Medicare Other | Attending: Medical | Admitting: Medical

## 2022-10-16 VITALS — BP 138/77 | HR 71 | Ht 66.5 in | Wt 198.6 lb

## 2022-10-16 DIAGNOSIS — R531 Weakness: Secondary | ICD-10-CM | POA: Diagnosis not present

## 2022-10-16 DIAGNOSIS — I10 Essential (primary) hypertension: Secondary | ICD-10-CM | POA: Insufficient documentation

## 2022-10-16 DIAGNOSIS — I25118 Atherosclerotic heart disease of native coronary artery with other forms of angina pectoris: Secondary | ICD-10-CM | POA: Insufficient documentation

## 2022-10-16 DIAGNOSIS — I5032 Chronic diastolic (congestive) heart failure: Secondary | ICD-10-CM | POA: Insufficient documentation

## 2022-10-16 DIAGNOSIS — I340 Nonrheumatic mitral (valve) insufficiency: Secondary | ICD-10-CM | POA: Insufficient documentation

## 2022-10-16 NOTE — Patient Instructions (Signed)
Medication Instructions:  Your Physician recommend you continue on your current medication as directed.    *If you need a refill on your cardiac medications before your next appointment, please call your pharmacy*   Lab Work: Your provider would like for you to have following labs drawn today CMET, CBC, TSH and Magnesium.   If you have labs (blood work) drawn today and your tests are completely normal, you will receive your results only by: MyChart Message (if you have MyChart) OR A paper copy in the mail If you have any lab test that is abnormal or we need to change your treatment, we will call you to review the results.   Testing/Procedures: None ordered.    Follow-Up: At [facility], you and your health needs are our priority.  As part of our continuing mission to provide you with exceptional heart care, we have created designated Provider Care Teams.  These Care Teams include your primary Cardiologist (physician) and Advanced Practice Providers (APPs -  Physician Assistants and Nurse Practitioners) who all work together to provide you with the care you need, when you need it.  We recommend signing up for the patient portal called "MyChart".  Sign up information is provided on this After Visit Summary.  MyChart is used to connect with patients for Virtual Visits (Telemedicine).  Patients are able to view lab/test results, encounter notes, upcoming appointments, etc.  Non-urgent messages can be sent to your provider as well.   To learn more about what you can do with MyChart, go to [email or web address].    Your next appointment:   4 month(s)  Provider:   Julien Nordmann, MD

## 2022-10-16 NOTE — Progress Notes (Unsigned)
Cardiology Office Note:    Date:  10/16/2022   ID:  [name], DOB 1930/03/29, MRN [id number]  PCP:  Emilio Aspen, MD  [facility] Cardiologist:  Julien Nordmann, MD  [facility] Electrophysiologist:  None   Referring MD: Emilio Aspen, *   Chief Complaint: 6 month follow-up  History of Present Illness:    [name] is a 89 y.o. female with a hx of CAD s/p PCI and subsequent CABG in 2018 (LIMA-LAD, SVG-OM, SVG-RPDA), TIA, bilateral carotid artery disease, HTN, HLD, and anxiety who is being seen for 02/17/22 for 6 month follow-up.  She had previous PCI and underwent CABG x3 in 12/2016 after cardiac cath showed severe three-vessel disease including 9% distal LMCA stenosis as well as CTO of RCA.  Her CABG occluded LIMA-LAD, SVG-PDA, SVG -OM.  Echocardiogram in 11/2017 revealed LVEF 55-60%, no wall motion abnormalities, G1 DD, moderate MR, mild TR, moderate pulmonary hypertension.  An echo in 11/2020 revealed LVEF 55-60%, no regional wall motion abnormalities, G1 DD, mild MR, and mild to moderate TR.   The patient was Admitted in September 2023 with chest discomfort. HS trop was negative x 2. Imdur was started. BNP was 399 and was given IV lasix. Toprol was increase for bigeminy.  The patient was admitted in February 2024 with abdominal discomfort, malaise and nausea. The patient underwent ERCP with sphincterectomy and removal of choledocholithiasis and dilation of gastric stenosis at the pyloris. Echo showed normal LVEF and no focal wall abnormalities. No further cardiac work-up was pursued.   The patient was last seen 02/2022 and was stable from a cardiac perspective.   Today, the patient reports she is very weak. She is sleepy throughout the day, which is not uncommon. She is eating and dirnking normally. EKG shows NSR with no changes. She has occasional brief chest pain episodes. No inciting events. She has SOB, not severe. No significant LLE. No bleeding issues,  nasuea or vomiting.   Past Medical History:  Diagnosis Date   Anxiety    Atypical carcinoid lung tumor (HCC)    CAD (coronary artery disease)    a. 1986: s/p PTCA of bifurcation lesion in LAD;  b. 2001: s/p BMS to LAD & LCX;  c. 04/2006 Cath: diff CAD w/o critical narrowing, nl EF;  d. 12/2014 MV: inflat ST dep, mild basal inf ischemia, EF 55-65%-->Low risk; e. 12/2016 - 3V CABG.   Cancer [facility]) yrs ago her pt   right lobe of lung. surgery only.    Carotid disease, bilateral (HCC)    a. 08/2015 Carotid U/S: < 39% bilat ICA stenosis.   Cataracts, bilateral    Cholangitis    Choledocholithiasis    Cholelithiases    Chronic diarrhea    Depression    Frequent PVCs    Gilbert's syndrome    Headache    PMH: migraines   History of echocardiogram    a. 06/2010 Echo: EF 60-65%, mild AI/MR; b. 10/2015 Echo: EF 60-65%, no rwma, mild MR, nl RV fxn.   HTN (hypertension)    Hyperlipidemia    Impaired fasting blood sugar    Keloid    mid sternum   Macular degeneration    Osteoarthritis    Pneumonia    PONV (postoperative nausea and vomiting)    Positional vertigo    benign   Postoperative atrial fibrillation (HCC) 12/2016   S/P CABG x 3 12/23/2016   LIMA to LAD, SVG to OM, SVG to PDA, [facility]  via left thigh   TIA (transient ischemic attack)    Wears glasses    Wears hearing aid     Past Surgical History:  Procedure Laterality Date   ABDOMINAL AORTOGRAM N/A 12/22/2016   Procedure: ABDOMINAL AORTOGRAM;  Surgeon: Yvonne Kendall, MD;  Location: [facility] INVASIVE CV LAB;  Service: Cardiovascular;  Laterality: N/A;   ABDOMINAL HYSTERECTOMY     BILIARY STENT PLACEMENT  12/10/2017   Procedure: BILIARY STENT PLACEMENT;  Surgeon: Lemar Lofty., MD;  Location: [facility] ENDOSCOPY;  Service: Gastroenterology;;   CHOLECYSTECTOMY     CORONARY ARTERY BYPASS GRAFT N/A 12/23/2016   Procedure: CORONARY ARTERY BYPASS GRAFTING (CABG) TIMES THREE USING LEFT INTERNAL MAMMARY ARTERY AND LEFT SAPHENOUS LEG  VEIN HARVESTED ENDOSCOPICALLY,;  Surgeon: Purcell Nails, MD;  Location: [facility] OR;  Service: Open Heart Surgery;  Laterality: N/A;   ENDOSCOPIC RETROGRADE CHOLANGIOPANCREATOGRAPHY (ERCP) WITH PROPOFOL N/A 02/21/2018   Procedure: ENDOSCOPIC RETROGRADE CHOLANGIOPANCREATOGRAPHY (ERCP) WITH PROPOFOL;  Surgeon: Meridee Score Netty Starring., MD;  Location: [facility] ENDOSCOPY;  Service: Gastroenterology;  Laterality: N/A;   ERCP N/A 12/10/2017   Procedure: ENDOSCOPIC RETROGRADE CHOLANGIOPANCREATOGRAPHY (ERCP);  Surgeon: Lemar Lofty., MD;  Location: [facility] ENDOSCOPY;  Service: Gastroenterology;  Laterality: N/A;   ERCP N/A 02/19/2022   Procedure: ENDOSCOPIC RETROGRADE CHOLANGIOPANCREATOGRAPHY (ERCP);  Surgeon: Midge Minium, MD;  Location: [facility] ENDOSCOPY;  Service: Endoscopy;  Laterality: N/A;   IABP INSERTION N/A 12/22/2016   Procedure: IABP Insertion;  Surgeon: Yvonne Kendall, MD;  Location: [facility] INVASIVE CV LAB;  Service: Cardiovascular;  Laterality: N/A;   LEFT HEART CATH AND CORONARY ANGIOGRAPHY N/A 12/22/2016   Procedure: LEFT HEART CATH AND CORONARY ANGIOGRAPHY;  Surgeon: Yvonne Kendall, MD;  Location: [facility] INVASIVE CV LAB;  Service: Cardiovascular;  Laterality: N/A;   LEFT HEART CATHETERIZATION WITH CORONARY ANGIOGRAM N/A 04/27/2012   Procedure: LEFT HEART CATHETERIZATION WITH CORONARY ANGIOGRAM;  Surgeon: Peter M Swaziland, MD;  Location: [facility] CATH LAB;  Service: Cardiovascular;  Laterality: N/A;   LOBECTOMY Right    RLL atypical carcinoid tumor - Dr Ronn Melena  1986   PCI of LAD  2001   with non des lad    pci of mid cfx     with non des and cutting balloon pci of av circumflex   REMOVAL OF STONES  02/21/2018   Procedure: REMOVAL OF STONES;  Surgeon: Lemar Lofty., MD;  Location: [facility] ENDOSCOPY;  Service: Gastroenterology;;   Dennison Mascot  02/21/2018   Procedure: Dennison Mascot;  Surgeon: Lemar Lofty., MD;  Location: [facility] ENDOSCOPY;  Service: Gastroenterology;;   Francine Graven REMOVAL   02/21/2018   Procedure: STENT REMOVAL;  Surgeon: Lemar Lofty., MD;  Location: [facility] ENDOSCOPY;  Service: Gastroenterology;;   TEE WITHOUT CARDIOVERSION N/A 12/23/2016   Procedure: TRANSESOPHAGEAL ECHOCARDIOGRAM (TEE);  Surgeon: Purcell Nails, MD;  Location: [facility] OR;  Service: Open Heart Surgery;  Laterality: N/A;    Current Medications: Current Meds  Medication Sig   acetaminophen (TYLENOL) 325 MG tablet Take 2 tablets (650 mg total) by mouth every 6 (six) hours as needed for mild pain or headache.   amLODipine (NORVASC) 5 MG tablet Take 1 tablet (5 mg total) by mouth 2 (two) times daily. 1 tablet in the morning and half tablet at night.   aspirin 81 MG tablet Take 81 mg by mouth daily.   atorvastatin (LIPITOR) 40 MG tablet TAKE 1 TABLET BY MOUTH EVERY DAY   calcium carbonate (TUMS EX) 750 MG chewable tablet Chew 2 tablets by mouth as needed  for heartburn.   Calcium Carbonate-Vitamin D (CALCIUM 600 + D PO) Take 1 tablet by mouth daily. 600 mg/ 400 mg   estradiol (ESTRACE) 0.5 MG tablet Take 0.5 mg by mouth daily.   famotidine (PEPCID) 20 MG tablet Take 20 mg by mouth 2 (two) times daily.   furosemide (LASIX) 40 MG tablet TAKE 1 TABLET BY MOUTH EVERY DAY   KLOR-CON M20 20 MEQ tablet TAKE 1 TABLET BY MOUTH TWICE A DAY   LORazepam (ATIVAN) 0.5 MG tablet Take 1 tablet (0.5 mg total) by mouth every 8 (eight) hours as needed for anxiety.   metoprolol succinate (TOPROL-XL) 25 MG 24 hr tablet Take 1 tablet (25 mg total) by mouth daily. Take with or immediately following a meal.   Multiple Vitamin (MULTIVITAMIN) tablet Take 1 tablet by mouth daily.   nitroGLYCERIN (NITROSTAT) 0.4 MG SL tablet Place 1 tablet (0.4 mg total) under the tongue every 5 (five) minutes as needed for chest pain.   telmisartan (MICARDIS) 80 MG tablet TAKE 1 TABLET BY MOUTH EVERY DAY     Allergies:   Sertraline hcl, Celecoxib, Ciprofloxacin, Clarithromycin, Codeine, Ferrous sulfate, Hydromorphone, Lisinopril, Macrobid  [nitrofurantoin monohyd macro], Nitrofurantoin, Oxycodone hcl, Prednisone, Penicillin g, Penicillins, and Sertraline hcl   Social History   Socioeconomic History   Marital status: Divorced    Spouse name: Not on file   Number of children: Not on file   Years of education: Not on file   Highest education level: Not on file  Occupational History   Occupation: retired  Tobacco Use   Smoking status: Never   Smokeless tobacco: Never  Vaping Use   Vaping status: Never Used  Substance and Sexual Activity   Alcohol use: No   Drug use: No   Sexual activity: Not Currently  Other Topics Concern   Not on file  Social History Narrative   Not on file   Social Determinants of Health   Financial Resource Strain: Not on file  Food Insecurity: No Food Insecurity (02/19/2022)   Hunger Vital Sign    Worried About Running Out of Food in the Last Year: Never true    Ran Out of Food in the Last Year: Never true  Transportation Needs: No Transportation Needs (02/19/2022)   PRAPARE - Administrator, Civil Service (Medical): No    Lack of Transportation (Non-Medical): No  Physical Activity: Unknown (02/03/2018)   Exercise Vital Sign    Days of Exercise per Week: 0 days    Minutes of Exercise per Session: Not on file  Stress: No Stress Concern Present (02/03/2018)   [employer] of Occupational Health - Occupational Stress Questionnaire    Feeling of Stress : Not at all  Social Connections: Unknown (02/03/2018)   Social Connection and Isolation Panel [NHANES]    Frequency of Communication with Friends and Family: Patient declined    Frequency of Social Gatherings with Friends and Family: Patient declined    Attends Religious Services: Patient declined    Database administrator or Organizations: Patient declined    Attends Engineer, structural: Patient declined    Marital Status: Patient declined     Family History: The patient's family history includes CAD in her father  and mother; Diabetes Mellitus I in her brother, mother, sister, and sister. There is no history of Colon cancer, Esophageal cancer, Inflammatory bowel disease, Liver disease, Pancreatic cancer, Rectal cancer, or Stomach cancer.  ROS:   Please see the history of present  illness.     All other systems reviewed and are negative.  EKGs/Labs/Other Studies Reviewed:    The following studies were reviewed today:  Echo 02/2022  1. Left ventricular ejection fraction, by estimation, is 60 to 65%. The  left ventricle has normal function. The left ventricle has no regional  wall motion abnormalities. Left ventricular diastolic parameters are  consistent with Grade I diastolic  dysfunction (impaired relaxation). Elevated left atrial pressure.   2. Right ventricular systolic function is normal. The right ventricular  size is normal. There is normal pulmonary artery systolic pressure.   3. The mitral valve is degenerative. Mild to moderate mitral valve  regurgitation. Mild mitral stenosis.   4. The aortic valve was not well visualized. There is mild calcification  of the aortic valve. There is moderate thickening of the aortic valve.  Aortic valve regurgitation is not visualized. Aortic valve  sclerosis/calcification is present, without any  evidence of aortic stenosis.   5. The inferior vena cava is normal in size with greater than 50%  respiratory variability, suggesting right atrial pressure of 3 mmHg.   Echo 09/2021 1. Left ventricular ejection fraction, by estimation, is 60 to 65%. The  left ventricle has normal function. The left ventricle has no regional  wall motion abnormalities. There is mild left ventricular hypertrophy.  Left ventricular diastolic parameters  are consistent with Grade I diastolic dysfunction (impaired relaxation).   2. Right ventricular systolic function is normal. The right ventricular  size is normal. There is normal pulmonary artery systolic pressure. The  estimated  right ventricular systolic pressure is 26.9 mmHg.   3. The mitral valve is normal in structure. Moderate mitral valve  regurgitation. No evidence of mitral stenosis.   4. Tricuspid valve regurgitation is moderate.   5. The aortic valve has an indeterminant number of cusps. Aortic valve  regurgitation is not visualized. No aortic stenosis is present. Aortic  valve mean gradient measures 7.5 mmHg.   6. The inferior vena cava is normal in size with greater than 50%  respiratory variability, suggesting right atrial pressure of 3 mmHg.    Echo 11/2020 1. Challenging images   2. Left ventricular ejection fraction, by estimation, is 55 to 60%. The  left ventricle has normal function. The left ventricle has no regional  wall motion abnormalities. Left ventricular diastolic parameters are  consistent with Grade I diastolic  dysfunction (impaired relaxation).   3. Right ventricular systolic function is normal. The right ventricular  size is normal. There is normal pulmonary artery systolic pressure. The  estimated right ventricular systolic pressure is 31.4 mmHg.   4. The mitral valve is normal in structure. Mild mitral valve  regurgitation. No evidence of mitral stenosis.   5. Tricuspid valve regurgitation is mild to moderate.   6. The aortic valve was not well visualized. There is moderate  calcification of the aortic valve. Aortic valve regurgitation is not  visualized. Mild to moderate aortic valve sclerosis/calcification is  present, without any evidence of aortic stenosis.   7. The inferior vena cava is normal in size with greater than 50%  respiratory variability, suggesting right atrial pressure of 3 mmHg.   Comparison(s): EF 55%.    LHC 2018 Conclusions: Severe, three vessel coronary artery disease, including 90% distal LMCA stenosis involving the ostia of the LAD and LCx, as well as chronic total occlusion of the RCA. Distal RCA fills via left-to-right collaterals. Mildly elevated  left ventricular filling pressure. Abdominal aortic atherosclerosis and calcification  without significant stenosis and aneurysm. Successful placement of 40 mL intraaortic balloon pump via the right common femoral artery.   Recommendations: Transfer to [facility] for cardiac surgery consultation. Though the patient is DNR, she was active until her chest pain began ~6 months ago. She is willing to rescind the DNR order for procedures. Surgical revascularization is preferable over PCI, as PCI would require Impella support and atherectomy with significant risk of morbidity and mortality. Initiate heparin infusion (no bolus) 2 hours after removal of right radial artery sheath. Discontinue clopidogrel. Last dose was given this morning (12/22/16). NTG infusion titrated to relief of chest pain. Increase statin therapy. Obtain echo today for evaluation of LV function.   Yvonne Kendall, MD [facility] Pager: [phone]  EKG:  EKG is  ordered today.  The ekg ordered today demonstrates NSR 71bpm, nonspecific ST/T wave changes  Recent Labs: 01/23/2022: TSH 2.037 02/20/2022: ALT 32; Magnesium 2.3 05/10/2022: B Natriuretic Peptide 288.0; BUN 18; Creatinine, Ser 1.16; Hemoglobin 12.9; Platelets 163; Potassium 3.7; Sodium 139  Recent Lipid Panel    Component Value Date/Time   CHOL 121 02/04/2018 0537   CHOL 136 11/04/2013 0541   TRIG 167 (H) 02/04/2018 0537   TRIG 329 (H) 11/04/2013 0541   HDL 47 02/04/2018 0537   HDL 56 11/04/2013 0541   CHOLHDL 2.6 02/04/2018 0537   VLDL 33 02/04/2018 0537   VLDL 66 (H) 11/04/2013 0541   LDLCALC 41 02/04/2018 0537   LDLCALC 14 11/04/2013 0541     Physical Exam:    VS:  BP 138/77 (BP Location: Left Arm, Patient Position: Sitting, Cuff Size: Normal)   Pulse 71   Ht 5' 6.5" (1.689 m)   Wt 198 lb 9.6 oz (90.1 kg)   SpO2 98%   BMI 31.57 kg/m     Wt Readings from Last 3 Encounters:  10/16/22 198 lb 9.6 oz (90.1 kg)  05/10/22 230 lb (104.3 kg)   03/03/22 196 lb (88.9 kg)     GEN:  Well nourished, well developed in no acute distress HEENT: Normal NECK: No JVD; No carotid bruits LYMPHATICS: No lymphadenopathy CARDIAC: RRR, no murmurs, rubs, gallops RESPIRATORY:  Clear to auscultation without rales, wheezing or rhonchi  ABDOMEN: Soft, non-tender, non-distended MUSCULOSKELETAL:  No edema; No deformity  SKIN: Warm and dry NEUROLOGIC:  Alert and oriented x 3 PSYCHIATRIC:  Normal affect   ASSESSMENT:    1. Coronary artery disease involving native coronary artery of native heart with other form of angina pectoris (HCC)   2. Weakness   3. Chronic diastolic heart failure (HCC)   4. Nonrheumatic mitral valve regurgitation   5. Essential hypertension    PLAN:    In order of problems listed above:  Weakness/fatigue Patient reports weakness and fatigue with good and bad days. She denies cardiac symptoms. She denies recent fever, chills or illness. She is eating and drinking normally. I will check CMET, CBC, TSH, and Mag. She has appointment with PCP in 2 weeks.   CAD s/p CABG in 2018 and previous PCI/DES Patient has brief episodes of sharp chest pains. We discussed the possible of stress testing, but decided further work-up will not greatly change quality of life given age and function. Continue Aspirin, Toprol, Lipitor, and SL NTG.  Chronic diastolic dysfunction Mod MR/TR Most recent echo 02/2022 showed LVEF 60-65%, no WMA, G1DD, mild to mod MR, mild mitral stenosis, moderate calcification of aortic valve. She has chronic and unchanged DOE. Continue Toprol, lasix and potassium.  HTN BP is reasonable. Continue amlodipine 5mg , Toprol 25mg  daily daily and telmisartan 80mg  daily.  Disposition: Follow up in 4 month(s) with MD/APP   Signed, Mckinley Adelstein David Stall, PA-C  10/16/2022 4:34 PM    [facility]

## 2022-10-17 LAB — CBC
Hematocrit: 40.9 % (ref 34.0–46.6)
Hemoglobin: 13.7 g/dL (ref 11.1–15.9)
MCH: 30.9 pg (ref 26.6–33.0)
MCHC: 33.5 g/dL (ref 31.5–35.7)
MCV: 92 fL (ref 79–97)
Platelets: 176 10*3/uL (ref 150–450)
RBC: 4.43 x10E6/uL (ref 3.77–5.28)
RDW: 12.3 % (ref 11.7–15.4)
WBC: 4.9 10*3/uL (ref 3.4–10.8)

## 2022-10-17 LAB — COMPREHENSIVE METABOLIC PANEL
ALT: 29 [IU]/L (ref 0–32)
AST: 37 [IU]/L (ref 0–40)
Albumin: 4.3 g/dL (ref 3.6–4.6)
Alkaline Phosphatase: 108 [IU]/L (ref 44–121)
BUN/Creatinine Ratio: 14 (ref 12–28)
BUN: 16 mg/dL (ref 10–36)
Bilirubin Total: 1.8 mg/dL — ABNORMAL HIGH (ref 0.0–1.2)
CO2: 22 mmol/L (ref 20–29)
Calcium: 10 mg/dL (ref 8.7–10.3)
Chloride: 103 mmol/L (ref 96–106)
Creatinine, Ser: 1.15 mg/dL — ABNORMAL HIGH (ref 0.57–1.00)
Globulin, Total: 2.6 g/dL (ref 1.5–4.5)
Glucose: 130 mg/dL — ABNORMAL HIGH (ref 70–99)
Potassium: 4.3 mmol/L (ref 3.5–5.2)
Sodium: 144 mmol/L (ref 134–144)
Total Protein: 6.9 g/dL (ref 6.0–8.5)
eGFR: 45 mL/min/{1.73_m2} — ABNORMAL LOW (ref >59)

## 2022-10-17 LAB — MAGNESIUM: Magnesium: 2 mg/dL (ref 1.6–2.3)

## 2022-10-17 LAB — TSH: TSH: 3.05 u[IU]/mL (ref 0.450–4.500)

## 2022-10-19 DIAGNOSIS — H353122 Nonexudative age-related macular degeneration, left eye, intermediate dry stage: Secondary | ICD-10-CM | POA: Diagnosis not present

## 2022-10-19 DIAGNOSIS — H353212 Exudative age-related macular degeneration, right eye, with inactive choroidal neovascularization: Secondary | ICD-10-CM | POA: Diagnosis not present

## 2022-10-19 DIAGNOSIS — H4322 Crystalline deposits in vitreous body, left eye: Secondary | ICD-10-CM | POA: Diagnosis not present

## 2022-10-19 DIAGNOSIS — H40003 Preglaucoma, unspecified, bilateral: Secondary | ICD-10-CM | POA: Diagnosis not present

## 2022-10-23 DIAGNOSIS — Z23 Encounter for immunization: Secondary | ICD-10-CM | POA: Diagnosis not present

## 2022-10-30 DIAGNOSIS — Z1331 Encounter for screening for depression: Secondary | ICD-10-CM | POA: Diagnosis not present

## 2022-10-30 DIAGNOSIS — E559 Vitamin D deficiency, unspecified: Secondary | ICD-10-CM | POA: Diagnosis not present

## 2022-10-30 DIAGNOSIS — E7800 Pure hypercholesterolemia, unspecified: Secondary | ICD-10-CM | POA: Diagnosis not present

## 2022-10-30 DIAGNOSIS — R5383 Other fatigue: Secondary | ICD-10-CM | POA: Diagnosis not present

## 2022-10-30 DIAGNOSIS — Z0000 Encounter for general adult medical examination without abnormal findings: Secondary | ICD-10-CM | POA: Diagnosis not present

## 2022-11-08 ENCOUNTER — Other Ambulatory Visit: Payer: Self-pay | Admitting: Cardiovascular Disease

## 2022-12-18 DIAGNOSIS — H353134 Nonexudative age-related macular degeneration, bilateral, advanced atrophic with subfoveal involvement: Secondary | ICD-10-CM | POA: Diagnosis not present

## 2022-12-18 DIAGNOSIS — H353212 Exudative age-related macular degeneration, right eye, with inactive choroidal neovascularization: Secondary | ICD-10-CM | POA: Diagnosis not present

## 2022-12-18 DIAGNOSIS — H541131 Blindness right eye category 3, low vision left eye category 1: Secondary | ICD-10-CM | POA: Diagnosis not present

## 2022-12-18 DIAGNOSIS — H53413 Scotoma involving central area, bilateral: Secondary | ICD-10-CM | POA: Diagnosis not present

## 2022-12-18 DIAGNOSIS — H542X21 Low vision right eye category 2, low vision left eye category 1: Secondary | ICD-10-CM | POA: Diagnosis not present

## 2022-12-18 DIAGNOSIS — H353120 Nonexudative age-related macular degeneration, left eye, stage unspecified: Secondary | ICD-10-CM | POA: Diagnosis not present

## 2023-02-13 ENCOUNTER — Other Ambulatory Visit: Payer: Self-pay | Admitting: Cardiovascular Disease

## 2023-02-14 NOTE — Progress Notes (Unsigned)
I Cardiology Office Note  Date:  02/15/2023   ID:  Audrey, Foster February 14, 1930, MRN [id number]  PCP:  Emilio Aspen, MD   Chief Complaint  Patient presents with   4 month follow up     Patient c/o shortness of breath when climbing stairs.     HPI:  [name] is a 89 y.o. woman with  CAD,  prior PCI of the LAD in 2001 with non DES, PCI of mid LCX with non des  chronic chest pain episodes,   cardiac catheterization April 2014 showing occluded RCA, patent LAD and left circumflex 50% carotid disease on the left.  Hospital December 2018 with chest pain, non-ST elevation MI Cardiac catheterization with critical left main disease, three-vessel disease Underwent CABG x3 Postop day 5 developed atrial fibrillation, given amiodarone, normal sinus rhythm restored LIMA to the LAD, vein graft to PDA, vein graft to OM Long history of GI issues, diarrhea, She presents today for follow-up of her coronary artery disease, frequent PVCs, hypertension, carotid arterial disease  LOV February 2024 Seen by one of our providers October 2024  Echo February 2024 EF 60 to 65% mild to moderate MR  Presents today with granddaughter Sedentary baseline, limited secondary to leg weakness, visual deficits  Severe macular degeneration, did therapy, did not slow down progression of her disease Spends her time listening to audiobooks Unable to see the television or read  No regular exercise program Reports that she is able to walk around the house to some degree Does not go much outside the house  Lives with son and family  EKG personally reviewed by myself on todays visit EKG Interpretation Date/Time:  Monday February 15 2023 13:31:03 EST Ventricular Rate:  78 PR Interval:  198 QRS Duration:  90 QT Interval:  406 QTC Calculation: 462 R Axis:   37  Text Interpretation: Sinus rhythm with marked sinus arrhythmia with occasional Premature ventricular complexes ST & T wave abnormality, consider  inferolateral ischemia When compared with ECG of 16-Oct-2022 14:08, Premature ventricular complexes are now Present QT has lengthened Confirmed by Julien Nordmann [id number]) on 02/15/2023 1:38:10 PM    hospital 2/24 Abdominal discomfort, malaise, nausea  Underwent ERCP with sphincterectomy and removal of choledocholithiasis and 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  In follow-up reports that she feels better  Symptomatic bradycardia on carvedilol  Previously on metoprolol succinate 50 for many years and was tolerating this well Was changed to tartrate at the time of discharge from the hospital September 2023 At a later date was changed to carvedilol  Admitted in September 2023 with chest discomfort. HS trop was negative x 2. Imdur was started. BNP was 399 and was given IV lasix.   Echocardiogram performed November 2022 Left ventricular ejection fraction, by estimation, is 55 to 60%. The  left ventricle has normal function. The left ventricle has no regional  wall motion abnormalities. Left ventricular diastolic parameters are  consistent with Grade I diastolic  dysfunction (impaired relaxation).  Right ventricular systolic function is normal. The right ventricular  size is normal. There is normal pulmonary artery systolic pressure. The  estimated right ventricular systolic pressure is 31.4 mmHg.   Other past medical history reviewed January 2020 admission to the hospital with chest pain, epigastric pain Hospital records reviewed with the patient in detail Evaluated by cardiology during her admission, symptoms felt to be GI in etiology, possibly related to gallbladder  In February 2020 she was noted to have gallbladder disease, stones  for which she underwent ERCP.   echocardiogram November 2019 reviewed with her. It showed ejection fraction of 55 - 60%   emergency room 07/03/2017 for hypertension Blood pressure 200 systolic at home  anxious  Long history of GI issues, diarrhea,  has to take extra potassium daily Most recent potassium above 3.5 at the end of December 2016   Chronic leg weakness, does her ADLs, does not work in the garden anymore, unable to get up if she is on the ground   PMH:   has a past medical history of Anxiety, Atypical carcinoid lung tumor (HCC), CAD (coronary artery disease), Cancer (HCC) (yrs ago her pt), Carotid disease, bilateral (HCC), Cataracts, bilateral, Cholangitis, Choledocholithiasis, Cholelithiases, Chronic diarrhea, Depression, Frequent PVCs, Gilbert's syndrome, Headache, History of echocardiogram, HTN (hypertension), Hyperlipidemia, Impaired fasting blood sugar, Keloid, Macular degeneration, Osteoarthritis, Pneumonia, PONV (postoperative nausea and vomiting), Positional vertigo, Postoperative atrial fibrillation (HCC) (12/2016), S/P CABG x 3 (12/23/2016), TIA (transient ischemic attack), Wears glasses, and Wears hearing aid.  PSH:    Past Surgical History:  Procedure Laterality Date   ABDOMINAL AORTOGRAM N/A 12/22/2016   Procedure: ABDOMINAL AORTOGRAM;  Surgeon: Yvonne Kendall, MD;  Location: [facility] INVASIVE CV LAB;  Service: Cardiovascular;  Laterality: N/A;   ABDOMINAL HYSTERECTOMY     BILIARY STENT PLACEMENT  12/10/2017   Procedure: BILIARY STENT PLACEMENT;  Surgeon: Lemar Lofty., MD;  Location: [facility] ENDOSCOPY;  Service: Gastroenterology;;   CHOLECYSTECTOMY     CORONARY ARTERY BYPASS GRAFT N/A 12/23/2016   Procedure: CORONARY ARTERY BYPASS GRAFTING (CABG) TIMES THREE USING LEFT INTERNAL MAMMARY ARTERY AND LEFT SAPHENOUS LEG VEIN HARVESTED ENDOSCOPICALLY,;  Surgeon: Purcell Nails, MD;  Location: [facility] OR;  Service: Open Heart Surgery;  Laterality: N/A;   ENDOSCOPIC RETROGRADE CHOLANGIOPANCREATOGRAPHY (ERCP) WITH PROPOFOL N/A 02/21/2018   Procedure: ENDOSCOPIC RETROGRADE CHOLANGIOPANCREATOGRAPHY (ERCP) WITH PROPOFOL;  Surgeon: Meridee Score Netty Starring., MD;  Location: [facility] ENDOSCOPY;  Service: Gastroenterology;  Laterality: N/A;    ERCP N/A 12/10/2017   Procedure: ENDOSCOPIC RETROGRADE CHOLANGIOPANCREATOGRAPHY (ERCP);  Surgeon: Lemar Lofty., MD;  Location: [facility] ENDOSCOPY;  Service: Gastroenterology;  Laterality: N/A;   ERCP N/A 02/19/2022   Procedure: ENDOSCOPIC RETROGRADE CHOLANGIOPANCREATOGRAPHY (ERCP);  Surgeon: Midge Minium, MD;  Location: [facility] ENDOSCOPY;  Service: Endoscopy;  Laterality: N/A;   IABP INSERTION N/A 12/22/2016   Procedure: IABP Insertion;  Surgeon: Yvonne Kendall, MD;  Location: [facility] INVASIVE CV LAB;  Service: Cardiovascular;  Laterality: N/A;   LEFT HEART CATH AND CORONARY ANGIOGRAPHY N/A 12/22/2016   Procedure: LEFT HEART CATH AND CORONARY ANGIOGRAPHY;  Surgeon: Yvonne Kendall, MD;  Location: [facility] INVASIVE CV LAB;  Service: Cardiovascular;  Laterality: N/A;   LEFT HEART CATHETERIZATION WITH CORONARY ANGIOGRAM N/A 04/27/2012   Procedure: LEFT HEART CATHETERIZATION WITH CORONARY ANGIOGRAM;  Surgeon: Peter M Swaziland, MD;  Location: [facility] CATH LAB;  Service: Cardiovascular;  Laterality: N/A;   LOBECTOMY Right    RLL atypical carcinoid tumor - Dr Ronn Melena  1986   PCI of LAD  2001   with non des lad    pci of mid cfx     with non des and cutting balloon pci of av circumflex   REMOVAL OF STONES  02/21/2018   Procedure: REMOVAL OF STONES;  Surgeon: Lemar Lofty., MD;  Location: [facility] ENDOSCOPY;  Service: Gastroenterology;;   Dennison Mascot  02/21/2018   Procedure: Dennison Mascot;  Surgeon: Lemar Lofty., MD;  Location: [facility] ENDOSCOPY;  Service: Gastroenterology;;   STENT REMOVAL  02/21/2018   Procedure: STENT REMOVAL;  Surgeon: Lemar Lofty., MD;  Location: [facility] ENDOSCOPY;  Service: Gastroenterology;;   TEE WITHOUT CARDIOVERSION N/A 12/23/2016   Procedure: TRANSESOPHAGEAL ECHOCARDIOGRAM (TEE);  Surgeon: Purcell Nails, MD;  Location: [facility] OR;  Service: Open Heart Surgery;  Laterality: N/A;    Current Outpatient Medications  Medication Sig Dispense Refill   acetaminophen  (TYLENOL) 325 MG tablet Take 2 tablets (650 mg total) by mouth every 6 (six) hours as needed for mild pain or headache.     amLODipine (NORVASC) 5 MG tablet Take 1 tablet (5 mg total) by mouth 2 (two) times daily. 1 tablet in the morning and half tablet at night. 180 tablet 3   aspirin 81 MG tablet Take 81 mg by mouth daily.     atorvastatin (LIPITOR) 40 MG tablet TAKE 1 TABLET BY MOUTH EVERY DAY 90 tablet 0   calcium carbonate (TUMS EX) 750 MG chewable tablet Chew 2 tablets by mouth as needed for heartburn.     Calcium Carbonate-Vitamin D (CALCIUM 600 + D PO) Take 1 tablet by mouth daily. 600 mg/ 400 mg     estradiol (ESTRACE) 0.5 MG tablet Take 0.5 mg by mouth daily.     famotidine (PEPCID) 20 MG tablet Take 20 mg by mouth 2 (two) times daily.     furosemide (LASIX) 40 MG tablet TAKE 1 TABLET BY MOUTH EVERY DAY 90 tablet 0   LORazepam (ATIVAN) 0.5 MG tablet Take 1 tablet (0.5 mg total) by mouth every 8 (eight) hours as needed for anxiety. 30 tablet 0   metoprolol succinate (TOPROL-XL) 25 MG 24 hr tablet TAKE 1 TABLET BY MOUTH DAILY. TAKE WITH OR IMMEDIATELY FOLLOWING A MEAL. 90 tablet 3   Multiple Vitamin (MULTIVITAMIN) tablet Take 1 tablet by mouth daily.     nitroGLYCERIN (NITROSTAT) 0.4 MG SL tablet Place 1 tablet (0.4 mg total) under the tongue every 5 (five) minutes as needed for chest pain. 25 tablet 1   potassium chloride SA (KLOR-CON M20) 20 MEQ tablet TAKE 1 TABLET BY MOUTH TWICE A DAY 180 tablet 3   telmisartan (MICARDIS) 80 MG tablet TAKE 1 TABLET BY MOUTH EVERY DAY 90 tablet 0   No current facility-administered medications for this visit.     Allergies:   Codeine, Penicillins, Sertraline hcl, Sertraline hcl, Ciprofloxacin, Nitrofurantoin, Oxycodone hcl, Prednisone, Celecoxib, Clarithromycin, Ferrous sulfate, Hydromorphone, Lisinopril, Macrobid [nitrofurantoin monohyd macro], and Penicillin g   Social History:  The patient  reports that she has never smoked. She has never used  smokeless tobacco. She reports that she does not drink alcohol and does not use drugs.   Family History:   family history includes CAD in her father and mother; Diabetes Mellitus I in her brother, mother, sister, and sister.    Review of Systems: Review of Systems  Constitutional: Negative.   Eyes: Negative.   Respiratory: Negative.  Negative for shortness of breath.   Cardiovascular: Negative.  Negative for chest pain.  Gastrointestinal: Negative.   Genitourinary: Negative.   Musculoskeletal: Negative.        Leg weakness  Neurological: Negative.   Psychiatric/Behavioral: Negative.    All other systems reviewed and are negative.   PHYSICAL EXAM: VS:  BP 110/60 (BP Location: Right Arm, Patient Position: Sitting, Cuff Size: Normal)   Pulse 78   Ht 5' 6.5" (1.689 m)   Wt 198 lb (89.8 kg)   BMI 31.48 kg/m  , BMI Body mass index is 31.48 kg/m.  Constitutional:  oriented to person, place,  and time. No distress.  HENT:  Head: Grossly normal Eyes:  no discharge. No scleral icterus.  Neck: No JVD, no carotid bruits  Cardiovascular: Regular rate and rhythm, no murmurs appreciated Pulmonary/Chest: Clear to auscultation bilaterally, no wheezes or rails Abdominal: Soft.  no distension.  no tenderness.  Musculoskeletal: Normal range of motion Neurological:  normal muscle tone. Coordination normal. No atrophy Skin: Skin warm and dry Psychiatric: normal affect, pleasant  Recent Labs: 05/10/2022: B Natriuretic Peptide 288.0 10/16/2022: ALT 29; BUN 16; Creatinine, Ser 1.15; Hemoglobin 13.7; Magnesium 2.0; Platelets 176; Potassium 4.3; Sodium 144; TSH 3.050    Lipid Panel Lab Results  Component Value Date   CHOL 121 02/04/2018   HDL 47 02/04/2018   LDLCALC 41 02/04/2018   TRIG 167 (H) 02/04/2018    Wt Readings from Last 3 Encounters:  02/15/23 198 lb (89.8 kg)  10/16/22 198 lb 9.6 oz (90.1 kg)  05/10/22 230 lb (104.3 kg)     ASSESSMENT AND PLAN:  Coronary artery disease  involving native coronary artery of native heart with angina pectoris with documented spasm (HCC)  CABG Three-vessel bypass surgery November 2018 Denies anginal symptoms Echocardiogram confirming normal ejection fraction 2024 -Blood pressure well-controlled, appears euvolemic Continue metoprolol succinate 25 daily  Essential hypertension  Blood pressure is well controlled on today's visit. No changes made to the medications.  Continue metoprolol succinate, amlodipine 5 in the morning 2 point 5 in the evening, Micardis 80 mg daily  PVC's (premature ventricular contractions) Continue metoprolol succinate 25 daily  Left carotid stenosis Less than 39% blockage in the carotids  Shortness of breath Deconditioned, weight running high, no regular exercise program, poor sleep Echocardiogram with normal ejection fraction Recommended regular walking program, limited secondary to visual deficits Will need to walk with family  Hyperlipidemia On statin, she prefers to have lab work done through primary care No recent lipid panel available  Orders Placed This Encounter  Procedures   EKG 12-Lead     Signed, Dossie Arbour, M.D., Ph.D. 02/15/2023  [facility] [place], [place] [phone]

## 2023-02-15 ENCOUNTER — Ambulatory Visit: Payer: Medicare Other | Attending: Cardiovascular Disease | Admitting: Cardiovascular Disease

## 2023-02-15 ENCOUNTER — Encounter: Payer: Self-pay | Admitting: Cardiovascular Disease

## 2023-02-15 VITALS — BP 110/60 | HR 78 | Ht 66.5 in | Wt 198.0 lb

## 2023-02-15 DIAGNOSIS — I340 Nonrheumatic mitral (valve) insufficiency: Secondary | ICD-10-CM | POA: Insufficient documentation

## 2023-02-15 DIAGNOSIS — R531 Weakness: Secondary | ICD-10-CM | POA: Diagnosis not present

## 2023-02-15 DIAGNOSIS — I25118 Atherosclerotic heart disease of native coronary artery with other forms of angina pectoris: Secondary | ICD-10-CM | POA: Insufficient documentation

## 2023-02-15 DIAGNOSIS — E782 Mixed hyperlipidemia: Secondary | ICD-10-CM | POA: Insufficient documentation

## 2023-02-15 DIAGNOSIS — R0602 Shortness of breath: Secondary | ICD-10-CM | POA: Diagnosis not present

## 2023-02-15 DIAGNOSIS — I10 Essential (primary) hypertension: Secondary | ICD-10-CM | POA: Diagnosis not present

## 2023-02-15 DIAGNOSIS — I739 Peripheral vascular disease, unspecified: Secondary | ICD-10-CM | POA: Diagnosis not present

## 2023-02-15 DIAGNOSIS — I5032 Chronic diastolic (congestive) heart failure: Secondary | ICD-10-CM | POA: Insufficient documentation

## 2023-02-15 DIAGNOSIS — Z951 Presence of aortocoronary bypass graft: Secondary | ICD-10-CM | POA: Diagnosis not present

## 2023-02-15 DIAGNOSIS — I6522 Occlusion and stenosis of left carotid artery: Secondary | ICD-10-CM | POA: Diagnosis not present

## 2023-02-15 MED ORDER — ATORVASTATIN CALCIUM 40 MG PO TABS: 40.0000 mg | ORAL_TABLET | Freq: Every day | ORAL | 3 refills | Status: AC

## 2023-02-15 MED ORDER — FUROSEMIDE 40 MG PO TABS: 40.0000 mg | ORAL_TABLET | Freq: Every day | ORAL | 3 refills | Status: AC

## 2023-02-15 MED ORDER — TELMISARTAN 80 MG PO TABS: 80.0000 mg | ORAL_TABLET | Freq: Every day | ORAL | 3 refills | Status: DC

## 2023-02-15 NOTE — Patient Instructions (Signed)
 Medication Instructions:  No changes  If you need a refill on your cardiac medications before your next appointment, please call your pharmacy.   Lab work: No new labs needed  Testing/Procedures: No new testing needed  Follow-Up: At [facility], you and your health needs are our priority.  As part of our continuing mission to provide you with exceptional heart care, we have created designated Provider Care Teams.  These Care Teams include your primary Cardiologist (physician) and Advanced Practice Providers (APPs -  Physician Assistants and Nurse Practitioners) who all work together to provide you with the care you need, when you need it.  You will need a follow up appointment in 12 months  Providers on your designated Care Team:   Nicolasa Ducking, NP Eula Listen, PA-C Cadence Fransico Michael, [place]  COVID-19 Vaccine Information can be found at: [email or web address] For questions related to vaccine distribution or appointments, please email [email or web address] or call [phone].

## 2023-03-28 ENCOUNTER — Other Ambulatory Visit: Payer: Self-pay

## 2023-03-28 ENCOUNTER — Emergency Department
Admission: EM | Admit: 2023-03-28 | Discharge: 2023-03-28 | Disposition: A | Discharge status: Home / Self Care | Attending: Emergency Medicine | Admitting: Emergency Medicine

## 2023-03-28 DIAGNOSIS — F419 Anxiety disorder, unspecified: Secondary | ICD-10-CM | POA: Diagnosis not present

## 2023-03-28 DIAGNOSIS — I10 Essential (primary) hypertension: Secondary | ICD-10-CM | POA: Diagnosis not present

## 2023-03-28 DIAGNOSIS — R531 Weakness: Secondary | ICD-10-CM | POA: Diagnosis not present

## 2023-03-28 DIAGNOSIS — I499 Cardiac arrhythmia, unspecified: Secondary | ICD-10-CM | POA: Diagnosis not present

## 2023-03-28 LAB — CBC WITH DIFFERENTIAL/PLATELET
Abs Immature Granulocytes: 0.01 10*3/uL (ref 0.00–0.07)
Basophils Absolute: 0 10*3/uL (ref 0.0–0.1)
Basophils Relative: 1 %
Eosinophils Absolute: 0.1 10*3/uL (ref 0.0–0.5)
Eosinophils Relative: 3 %
HCT: 40.4 % (ref 36.0–46.0)
Hemoglobin: 13.5 g/dL (ref 12.0–15.0)
Immature Granulocytes: 0 %
Lymphocytes Relative: 24 %
Lymphs Abs: 1.2 10*3/uL (ref 0.7–4.0)
MCH: 30.8 pg (ref 26.0–34.0)
MCHC: 33.4 g/dL (ref 30.0–36.0)
MCV: 92.2 fL (ref 80.0–100.0)
Monocytes Absolute: 0.6 10*3/uL (ref 0.1–1.0)
Monocytes Relative: 12 %
Neutro Abs: 3 10*3/uL (ref 1.7–7.7)
Neutrophils Relative %: 60 %
Platelets: 193 10*3/uL (ref 150–400)
RBC: 4.38 MIL/uL (ref 3.87–5.11)
RDW: 12.6 % (ref 11.5–15.5)
WBC: 5 10*3/uL (ref 4.0–10.5)
nRBC: 0 % (ref 0.0–0.2)

## 2023-03-28 LAB — COMPREHENSIVE METABOLIC PANEL
ALT: 26 U/L (ref 0–44)
AST: 35 U/L (ref 15–41)
Albumin: 4 g/dL (ref 3.5–5.0)
Alkaline Phosphatase: 91 U/L (ref 38–126)
Anion gap: 7 (ref 5–15)
BUN: 18 mg/dL (ref 8–23)
CO2: 26 mmol/L (ref 22–32)
Calcium: 9 mg/dL (ref 8.9–10.3)
Chloride: 104 mmol/L (ref 98–111)
Creatinine, Ser: 1.02 mg/dL — ABNORMAL HIGH (ref 0.44–1.00)
GFR, Estimated: 52 mL/min — ABNORMAL LOW (ref >60)
Glucose, Bld: 133 mg/dL — ABNORMAL HIGH (ref 70–99)
Potassium: 3.3 mmol/L — ABNORMAL LOW (ref 3.5–5.1)
Sodium: 137 mmol/L (ref 135–145)
Total Bilirubin: 1.8 mg/dL — ABNORMAL HIGH (ref 0.0–1.2)
Total Protein: 7.3 g/dL (ref 6.5–8.1)

## 2023-03-28 LAB — TROPONIN I (HIGH SENSITIVITY): Troponin I (High Sensitivity): 8 ng/L (ref <18)

## 2023-03-28 NOTE — ED Triage Notes (Signed)
 Pt arrives via EMS from home for hypertension. Pt denies specific complaints but says "Something doesn't feel right." Pt says she is losing her vision and when she woke up it was worse than normal, and she had a panic attack. Pt took her prescribed ativan 2 hours ago. Pt denies pain.

## 2023-03-28 NOTE — ED Provider Notes (Signed)
 [facility] Provider Note    Event Date/Time   First MD Initiated Contact with Patient 03/28/23 1445     (approximate)   History   Hypertension   HPI  [name] is a 89 y.o. female who presents with concerns for high blood pressure and possible anxiety.  Patient reports that she was napping in her recliner today and when she woke up her vision seemed to be worse in her left eye than typical, she does have significant macular degeneration, she reports this was very scary to her and frightening and she thinks she may have had a panic attack because she had a "sense of doom ".  Her vision improved within seconds to minutes and she reports it is at baseline now     Physical Exam   Triage Vital Signs: ED Triage Vitals  Encounter Vitals Group     BP 03/28/23 1416 (!) 158/79     Systolic BP Percentile --      Diastolic BP Percentile --      Pulse Rate 03/28/23 1416 73     Resp 03/28/23 1416 19     Temp 03/28/23 1416 98.1 F (36.7 C)     Temp src --      SpO2 03/28/23 1416 100 %     Weight --      Height --      Head Circumference --      Peak Flow --      Pain Score 03/28/23 1417 0     Pain Loc --      Pain Education --      Exclude from Growth Chart --     Most recent vital signs: Vitals:   03/28/23 1416  BP: (!) 158/79  Pulse: 73  Resp: 19  Temp: 98.1 F (36.7 C)  SpO2: 100%     General: Awake, no distress.  CV:  Good peripheral perfusion.  Resp:  Normal effort.  Abd:  No distention.  Other:  Normal neurologic exam   ED Results / Procedures / Treatments   Labs (all labs ordered are listed, but only abnormal results are displayed) Labs Reviewed  COMPREHENSIVE METABOLIC PANEL - Abnormal; Notable for the following components:      Result Value   Potassium 3.3 (*)    Glucose, Bld 133 (*)    Creatinine, Ser 1.02 (*)    Total Bilirubin 1.8 (*)    GFR, Estimated 52 (*)    All other components within normal limits  CBC WITH  DIFFERENTIAL/PLATELET  TROPONIN I (HIGH SENSITIVITY)     EKG  ED ECG REPORT I, Jene Every, the attending physician, personally viewed and interpreted this ECG.  Date: 03/28/2023  Rhythm: normal sinus rhythm QRS Axis: normal Intervals: normal ST/T Wave abnormalities: Not definitely changed from prior Narrative Interpretation: no evidence of acute ischemia    RADIOLOGY     PROCEDURES:  Critical Care performed:   Procedures   MEDICATIONS ORDERED IN ED: Medications - No data to display   IMPRESSION / MDM / ASSESSMENT AND PLAN / ED COURSE  I reviewed the triage vital signs and the nursing notes. Patient's presentation is most consistent with acute presentation with potential threat to life or bodily function.  Patient presents with concerns for elevated blood pressure, anxiety, change in vision now resolved.  This does not appear consistent with TIA or stroke given her vision seemed worse for very short period of time and then improved and  she had no other deficits.  Her lab work and workup today is reassuring, neurologic exam is reassuring, typically her blood pressure is well-controlled and only mildly elevated today.  She feels much better here in the emergency department, we discussed further workup including CT scan but she declined, at this time appropriate for discharge with close outpatient follow-up, return precautions discussed        FINAL CLINICAL IMPRESSION(S) / ED DIAGNOSES   Final diagnoses:  Hypertension, unspecified type  Anxiety     Rx / DC Orders   ED Discharge Orders     None        Note:  This document was prepared using Dragon voice recognition software and may include unintentional dictation errors.   Jene Every, MD 03/28/23 [phone]

## 2023-03-28 NOTE — Discharge Instructions (Addendum)
 Your lab work and EKG were reassuring here, as you describe your symptoms improved very rapidly.  This does not appear to be consistent with a stroke or mini stroke, as you described you think anxiety may have had something to do with it because of a sense of doom that you had.  This is certainly possible.  Please follow-up with your doctor, if new symptoms develop please return to the emergency department

## 2023-05-14 DIAGNOSIS — G5792 Unspecified mononeuropathy of left lower limb: Secondary | ICD-10-CM | POA: Diagnosis not present

## 2023-05-14 DIAGNOSIS — N1831 Chronic kidney disease, stage 3a: Secondary | ICD-10-CM | POA: Diagnosis not present

## 2023-05-21 DIAGNOSIS — H40003 Preglaucoma, unspecified, bilateral: Secondary | ICD-10-CM | POA: Diagnosis not present

## 2023-05-21 DIAGNOSIS — H353212 Exudative age-related macular degeneration, right eye, with inactive choroidal neovascularization: Secondary | ICD-10-CM | POA: Diagnosis not present

## 2023-05-21 DIAGNOSIS — H353122 Nonexudative age-related macular degeneration, left eye, intermediate dry stage: Secondary | ICD-10-CM | POA: Diagnosis not present

## 2023-05-21 DIAGNOSIS — H353190 Nonexudative age-related macular degeneration, unspecified eye, stage unspecified: Secondary | ICD-10-CM | POA: Diagnosis not present

## 2023-06-11 DIAGNOSIS — H90A32 Mixed conductive and sensorineural hearing loss, unilateral, left ear with restricted hearing on the contralateral side: Secondary | ICD-10-CM | POA: Diagnosis not present

## 2023-10-22 DIAGNOSIS — Z23 Encounter for immunization: Secondary | ICD-10-CM | POA: Diagnosis not present

## 2023-11-07 ENCOUNTER — Other Ambulatory Visit: Payer: Self-pay | Admitting: Cardiovascular Disease

## 2023-11-26 DIAGNOSIS — Z0000 Encounter for general adult medical examination without abnormal findings: Secondary | ICD-10-CM | POA: Diagnosis not present

## 2023-11-26 DIAGNOSIS — I129 Hypertensive chronic kidney disease with stage 1 through stage 4 chronic kidney disease, or unspecified chronic kidney disease: Secondary | ICD-10-CM | POA: Diagnosis not present

## 2023-11-26 DIAGNOSIS — E7800 Pure hypercholesterolemia, unspecified: Secondary | ICD-10-CM | POA: Diagnosis not present

## 2023-11-26 DIAGNOSIS — Z1331 Encounter for screening for depression: Secondary | ICD-10-CM | POA: Diagnosis not present

## 2024-02-04 ENCOUNTER — Other Ambulatory Visit: Payer: Self-pay | Admitting: Cardiovascular Disease

## 2024-02-17 ENCOUNTER — Other Ambulatory Visit: Payer: Self-pay | Admitting: Cardiovascular Disease

## 2024-02-17 NOTE — Telephone Encounter (Signed)
Please contact pt for future appointment. Pt overdue for follow up.
